# Patient Record
Sex: Female | Born: 1985
Health system: Southern US, Community
[De-identification: ages and names within clinical notes are randomized; demographics above are authoritative.]

## PROBLEM LIST (undated history)

## (undated) DIAGNOSIS — M351 Other overlap syndromes: Secondary | ICD-10-CM

## (undated) DIAGNOSIS — M069 Rheumatoid arthritis, unspecified: Secondary | ICD-10-CM

## (undated) DIAGNOSIS — M3320 Polymyositis, organ involvement unspecified: Secondary | ICD-10-CM

## (undated) DIAGNOSIS — I272 Pulmonary hypertension, unspecified: Secondary | ICD-10-CM

## (undated) DIAGNOSIS — K209 Esophagitis, unspecified without bleeding: Secondary | ICD-10-CM

## (undated) DIAGNOSIS — D649 Anemia, unspecified: Secondary | ICD-10-CM

## (undated) DIAGNOSIS — Z794 Long term (current) use of insulin: Principal | ICD-10-CM

## (undated) DIAGNOSIS — E119 Type 2 diabetes mellitus without complications: Principal | ICD-10-CM

## (undated) DIAGNOSIS — R7881 Bacteremia: Secondary | ICD-10-CM

## (undated) DIAGNOSIS — D259 Leiomyoma of uterus, unspecified: Secondary | ICD-10-CM

## (undated) DIAGNOSIS — J4520 Mild intermittent asthma, uncomplicated: Secondary | ICD-10-CM

## (undated) DIAGNOSIS — R509 Fever, unspecified: Secondary | ICD-10-CM

## (undated) DIAGNOSIS — R748 Abnormal levels of other serum enzymes: Secondary | ICD-10-CM

## (undated) DIAGNOSIS — J111 Influenza due to unidentified influenza virus with other respiratory manifestations: Secondary | ICD-10-CM

## (undated) DIAGNOSIS — E1110 Type 2 diabetes mellitus with ketoacidosis without coma: Principal | ICD-10-CM

## (undated) DIAGNOSIS — J069 Acute upper respiratory infection, unspecified: Secondary | ICD-10-CM

## (undated) DIAGNOSIS — R519 Headache, unspecified: Secondary | ICD-10-CM

## (undated) DIAGNOSIS — R7989 Other specified abnormal findings of blood chemistry: Secondary | ICD-10-CM

## (undated) DIAGNOSIS — B9561 Methicillin susceptible Staphylococcus aureus infection as the cause of diseases classified elsewhere: Principal | ICD-10-CM

## (undated) DIAGNOSIS — E1165 Type 2 diabetes mellitus with hyperglycemia: Secondary | ICD-10-CM

## (undated) DIAGNOSIS — L509 Urticaria, unspecified: Secondary | ICD-10-CM

## (undated) DIAGNOSIS — M71052 Abscess of bursa, left hip: Principal | ICD-10-CM

## (undated) DIAGNOSIS — Z0000 Encounter for general adult medical examination without abnormal findings: Secondary | ICD-10-CM

## (undated) DIAGNOSIS — E1369 Other specified diabetes mellitus with other specified complication: Principal | ICD-10-CM

---
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
## 2006-04-14 ENCOUNTER — Other Ambulatory Visit: Admission: RE | Admit: 2006-04-14 | Discharge: 2006-04-14 | Payer: Self-pay | Admitting: Gynecology

## 2007-02-09 ENCOUNTER — Ambulatory Visit: Payer: Self-pay | Admitting: Physical Medicine & Rehabilitation

## 2007-02-10 ENCOUNTER — Encounter
Admission: RE | Admit: 2007-02-10 | Discharge: 2007-02-10 | Payer: Self-pay | Admitting: Physical Medicine & Rehabilitation

## 2008-10-14 ENCOUNTER — Ambulatory Visit: Payer: Self-pay | Admitting: Gynecology

## 2008-10-14 ENCOUNTER — Encounter: Payer: Self-pay | Admitting: Gynecology

## 2009-06-21 ENCOUNTER — Ambulatory Visit: Payer: Self-pay | Admitting: Diagnostic Radiology

## 2009-06-21 ENCOUNTER — Emergency Department (HOSPITAL_BASED_OUTPATIENT_CLINIC_OR_DEPARTMENT_OTHER): Admission: EM | Admit: 2009-06-21 | Discharge: 2009-06-21 | Payer: Self-pay | Admitting: Emergency Medicine

## 2009-12-10 ENCOUNTER — Emergency Department (HOSPITAL_BASED_OUTPATIENT_CLINIC_OR_DEPARTMENT_OTHER): Admission: EM | Admit: 2009-12-10 | Discharge: 2009-12-10 | Payer: Self-pay | Admitting: Emergency Medicine

## 2010-01-31 ENCOUNTER — Emergency Department (HOSPITAL_BASED_OUTPATIENT_CLINIC_OR_DEPARTMENT_OTHER): Admission: EM | Admit: 2010-01-31 | Discharge: 2010-01-31 | Payer: Self-pay | Admitting: Emergency Medicine

## 2010-01-31 ENCOUNTER — Ambulatory Visit: Payer: Self-pay | Admitting: Diagnostic Radiology

## 2010-06-27 LAB — URINALYSIS, ROUTINE W REFLEX MICROSCOPIC
Bilirubin Urine: NEGATIVE
Glucose, UA: NEGATIVE mg/dL
Hgb urine dipstick: NEGATIVE
Ketones, ur: NEGATIVE mg/dL
Nitrite: NEGATIVE
Protein, ur: NEGATIVE mg/dL
Specific Gravity, Urine: 1.005 (ref 1.005–1.030)
Urobilinogen, UA: 0.2 mg/dL (ref 0.0–1.0)
pH: 7 (ref 5.0–8.0)

## 2010-06-27 LAB — DIFFERENTIAL
Basophils Absolute: 0 10*3/uL (ref 0.0–0.1)
Basophils Relative: 0 % (ref 0–1)
Eosinophils Absolute: 0 10*3/uL (ref 0.0–0.7)
Eosinophils Relative: 0 % (ref 0–5)
Lymphocytes Relative: 18 % (ref 12–46)
Lymphs Abs: 2.5 10*3/uL (ref 0.7–4.0)
Monocytes Absolute: 0.2 10*3/uL (ref 0.1–1.0)
Monocytes Relative: 1 % — ABNORMAL LOW (ref 3–12)
Neutro Abs: 10.9 10*3/uL — ABNORMAL HIGH (ref 1.7–7.7)
Neutrophils Relative %: 80 % — ABNORMAL HIGH (ref 43–77)

## 2010-06-27 LAB — CBC
HCT: 31 % — ABNORMAL LOW (ref 36.0–46.0)
Hemoglobin: 10.1 g/dL — ABNORMAL LOW (ref 12.0–15.0)
MCH: 24.5 pg — ABNORMAL LOW (ref 26.0–34.0)
MCHC: 32.6 g/dL (ref 30.0–36.0)
MCV: 74.9 fL — ABNORMAL LOW (ref 78.0–100.0)
Platelets: 456 10*3/uL — ABNORMAL HIGH (ref 150–400)
RBC: 4.13 MIL/uL (ref 3.87–5.11)
RDW: 16.4 % — ABNORMAL HIGH (ref 11.5–15.5)
WBC: 13.6 10*3/uL — ABNORMAL HIGH (ref 4.0–10.5)

## 2010-06-27 LAB — BASIC METABOLIC PANEL
BUN: 9 mg/dL (ref 6–23)
CO2: 26 mEq/L (ref 19–32)
Calcium: 8.7 mg/dL (ref 8.4–10.5)
Chloride: 106 mEq/L (ref 96–112)
Creatinine, Ser: 0.4 mg/dL (ref 0.4–1.2)
GFR calc Af Amer: >60 mL/min (ref >60)
GFR calc non Af Amer: >60 mL/min (ref >60)
Glucose, Bld: 138 mg/dL — ABNORMAL HIGH (ref 70–99)
Potassium: 4.4 mEq/L (ref 3.5–5.1)
Sodium: 143 mEq/L (ref 135–145)

## 2010-06-27 LAB — POCT CARDIAC MARKERS
CKMB, poc: 6.5 ng/mL (ref 1.0–8.0)
CKMB, poc: 9.6 ng/mL (ref 1.0–8.0)
Myoglobin, poc: 349 ng/mL (ref 12–200)
Myoglobin, poc: >500 ng/mL (ref 12–200)
Troponin i, poc: <0.05 ng/mL (ref 0.00–0.09)
Troponin i, poc: <0.05 ng/mL (ref 0.00–0.09)

## 2010-06-27 LAB — PREGNANCY, URINE: Preg Test, Ur: NEGATIVE

## 2010-07-04 ENCOUNTER — Ambulatory Visit: Payer: Self-pay | Admitting: Family Medicine

## 2010-07-04 DIAGNOSIS — Z0289 Encounter for other administrative examinations: Secondary | ICD-10-CM

## 2010-07-09 LAB — DIFFERENTIAL
Basophils Absolute: 0 10*3/uL (ref 0.0–0.1)
Basophils Relative: 0 % (ref 0–1)
Eosinophils Absolute: 0.2 10*3/uL (ref 0.0–0.7)
Eosinophils Relative: 2 % (ref 0–5)
Lymphocytes Relative: 31 % (ref 12–46)
Lymphs Abs: 2.7 10*3/uL (ref 0.7–4.0)
Monocytes Absolute: 0.3 10*3/uL (ref 0.1–1.0)
Monocytes Relative: 3 % (ref 3–12)
Neutro Abs: 5.6 10*3/uL (ref 1.7–7.7)
Neutrophils Relative %: 63 % (ref 43–77)

## 2010-07-09 LAB — BASIC METABOLIC PANEL
BUN: 9 mg/dL (ref 6–23)
CO2: 30 mEq/L (ref 19–32)
Calcium: 8.5 mg/dL (ref 8.4–10.5)
Chloride: 106 mEq/L (ref 96–112)
Creatinine, Ser: 0.4 mg/dL (ref 0.4–1.2)
GFR calc Af Amer: >60 mL/min (ref >60)
GFR calc non Af Amer: >60 mL/min (ref >60)
Glucose, Bld: 100 mg/dL — ABNORMAL HIGH (ref 70–99)
Potassium: 4 mEq/L (ref 3.5–5.1)
Sodium: 143 mEq/L (ref 135–145)

## 2010-07-09 LAB — CBC
HCT: 30.7 % — ABNORMAL LOW (ref 36.0–46.0)
Hemoglobin: 9.9 g/dL — ABNORMAL LOW (ref 12.0–15.0)
MCHC: 32.4 g/dL (ref 30.0–36.0)
MCV: 73.9 fL — ABNORMAL LOW (ref 78.0–100.0)
Platelets: 443 10*3/uL — ABNORMAL HIGH (ref 150–400)
RBC: 4.16 MIL/uL (ref 3.87–5.11)
RDW: 15.4 % (ref 11.5–15.5)
WBC: 8.8 10*3/uL (ref 4.0–10.5)

## 2010-12-18 ENCOUNTER — Encounter: Payer: Self-pay | Admitting: *Deleted

## 2010-12-18 ENCOUNTER — Emergency Department (INDEPENDENT_AMBULATORY_CARE_PROVIDER_SITE_OTHER): Payer: BC Managed Care – PPO

## 2010-12-18 ENCOUNTER — Emergency Department (HOSPITAL_BASED_OUTPATIENT_CLINIC_OR_DEPARTMENT_OTHER)
Admission: EM | Admit: 2010-12-18 | Discharge: 2010-12-18 | Disposition: A | Discharge status: Home / Self Care | Payer: BC Managed Care – PPO | Attending: Emergency Medicine | Admitting: Emergency Medicine

## 2010-12-18 ENCOUNTER — Other Ambulatory Visit: Payer: Self-pay

## 2010-12-18 DIAGNOSIS — R109 Unspecified abdominal pain: Secondary | ICD-10-CM

## 2010-12-18 DIAGNOSIS — R110 Nausea: Secondary | ICD-10-CM

## 2010-12-18 DIAGNOSIS — M3320 Polymyositis, organ involvement unspecified: Secondary | ICD-10-CM

## 2010-12-18 DIAGNOSIS — R1013 Epigastric pain: Secondary | ICD-10-CM | POA: Insufficient documentation

## 2010-12-18 DIAGNOSIS — K5900 Constipation, unspecified: Secondary | ICD-10-CM | POA: Insufficient documentation

## 2010-12-18 HISTORY — DX: Rheumatoid arthritis, unspecified: M06.9

## 2010-12-18 HISTORY — DX: Polymyositis, organ involvement unspecified: M33.20

## 2010-12-18 LAB — LACTIC ACID, PLASMA: Lactic Acid, Venous: 1 mmol/L (ref 0.5–2.2)

## 2010-12-18 LAB — URINALYSIS, ROUTINE W REFLEX MICROSCOPIC
Glucose, UA: NEGATIVE mg/dL
Hgb urine dipstick: NEGATIVE
Ketones, ur: NEGATIVE mg/dL
Leukocytes, UA: NEGATIVE
Nitrite: NEGATIVE
Specific Gravity, Urine: 1.017 (ref 1.005–1.030)
Urobilinogen, UA: 1 mg/dL (ref 0.0–1.0)
pH: 7 (ref 5.0–8.0)

## 2010-12-18 LAB — CBC
HCT: 28.5 % — ABNORMAL LOW (ref 36.0–46.0)
Hemoglobin: 8.2 g/dL — ABNORMAL LOW (ref 12.0–15.0)
MCH: 20.9 pg — ABNORMAL LOW (ref 26.0–34.0)
MCHC: 28.8 g/dL — ABNORMAL LOW (ref 30.0–36.0)
MCV: 72.5 fL — ABNORMAL LOW (ref 78.0–100.0)
Platelets: 473 10*3/uL — ABNORMAL HIGH (ref 150–400)
RBC: 3.93 MIL/uL (ref 3.87–5.11)
WBC: 11 10*3/uL — ABNORMAL HIGH (ref 4.0–10.5)

## 2010-12-18 LAB — COMPREHENSIVE METABOLIC PANEL
ALT: 19 U/L (ref 0–35)
AST: 48 U/L — ABNORMAL HIGH (ref 0–37)
Albumin: 2.5 g/dL — ABNORMAL LOW (ref 3.5–5.2)
Alkaline Phosphatase: 83 U/L (ref 39–117)
BUN: 10 mg/dL (ref 6–23)
CO2: 23 mEq/L (ref 19–32)
Chloride: 102 mEq/L (ref 96–112)
Creatinine, Ser: <0.47 mg/dL — ABNORMAL LOW (ref 0.50–1.10)
Glucose, Bld: 99 mg/dL (ref 70–99)
Potassium: 4 mEq/L (ref 3.5–5.1)
Sodium: 137 mEq/L (ref 135–145)
Total Protein: 8.3 g/dL (ref 6.0–8.3)

## 2010-12-18 LAB — PREGNANCY, URINE: Preg Test, Ur: NEGATIVE

## 2010-12-18 LAB — LIPASE, BLOOD: Lipase: 17 U/L (ref 11–59)

## 2010-12-18 MED ORDER — FAMOTIDINE IN NACL 20-0.9 MG/50ML-% IV SOLN
20.0000 mg | Freq: Once | INTRAVENOUS | Status: AC
  Administered 2010-12-18: 20 mg via INTRAVENOUS
  Filled 2010-12-18: qty 50

## 2010-12-18 MED ORDER — GI COCKTAIL ~~LOC~~
30.0000 mL | Freq: Once | ORAL | Status: AC
  Administered 2010-12-18: 30 mL via ORAL
  Filled 2010-12-18: qty 30

## 2010-12-18 MED ORDER — RANITIDINE HCL 150 MG PO CAPS: 150.0000 mg | ORAL_CAPSULE | Freq: Every day | ORAL | Status: DC

## 2010-12-18 MED ORDER — OMEPRAZOLE 20 MG PO CPDR: 20.0000 mg | DELAYED_RELEASE_CAPSULE | Freq: Every day | ORAL | Status: AC

## 2010-12-18 MED ORDER — ONDANSETRON HCL 4 MG/2ML IJ SOLN
4.0000 mg | Freq: Once | INTRAMUSCULAR | Status: AC
  Administered 2010-12-18: 4 mg via INTRAVENOUS
  Filled 2010-12-18: qty 2

## 2010-12-18 NOTE — ED Notes (Signed)
Assist with rectal exam, up to bathroom

## 2010-12-18 NOTE — ED Notes (Signed)
Awaiting disposition, family at bedside

## 2010-12-18 NOTE — ED Notes (Signed)
Medicated as noted, waiting on xray, family at bedside

## 2010-12-18 NOTE — ED Notes (Signed)
Pt states she has felt constipated since Sunday, has taken some "gas ex".  Had bowel movement today, "was hard, dry, and not my normal".  Also co some difficulty swallowing at times

## 2010-12-18 NOTE — ED Notes (Signed)
Started feeling discomfort in upper abdomen on Sunday feels like it goes to her back no vomiting having nausea states had bowel movement this am but was "hard" feels like she is a bit constipated

## 2010-12-18 NOTE — ED Notes (Signed)
Pt returns from xray

## 2010-12-18 NOTE — ED Provider Notes (Addendum)
History     CSN: [id number] Arrival date & time: 12/18/2010 10:33 AM  Chief Complaint  Patient presents with  . Abdominal Pain  . Constipation   HPI Comments: History rheumatoid arthritis on chronic steroids presenting with epigastric pain for the past 3 days associated with nausea. The pain is burning and worse when she eats. It is also worse if she sits up.  She has a decreased appetite and feels like she is constipated. Her bowel movements have been hard. She has not noticed any blood in her stools. She's not had vomiting. She denies any chest pain or shortness of breath denies any difficulty swallowing.  The history is provided by the patient.    Past Medical History  Diagnosis Date  . Arthritis, rheumatoid   . Polymyositis     History reviewed. No pertinent past surgical history.  History reviewed. No pertinent family history.  History  Substance Use Topics  . Smoking status: Never Smoker   . Smokeless tobacco: Not on file  . Alcohol Use: No    OB History    Grav Para Term Preterm Abortions TAB SAB Ect Mult Living                  Review of Systems  Constitutional: Positive for appetite change. Negative for fever.  HENT: Positive for sore throat. Negative for trouble swallowing.   Eyes: Negative.  Negative for visual disturbance.  Respiratory: Positive for chest tightness. Negative for cough and shortness of breath.   Gastrointestinal: Positive for nausea, abdominal pain and constipation. Negative for vomiting.  Genitourinary: Negative for dysuria, hematuria, vaginal bleeding and vaginal pain.  Musculoskeletal: Negative for back pain.  Neurological: Negative for seizures, weakness and headaches.  Psychiatric/Behavioral: Negative.     Physical Exam  BP 123/76  Pulse 100  Temp(Src) 98.5 F (36.9 C) (Oral)  Resp 18  SpO2 99%  LMP 12/14/2010  Physical Exam  Constitutional: She is oriented to person, place, and time. She appears well-developed and  well-nourished. No distress.  HENT:  Head: Normocephalic and atraumatic.  Mouth/Throat: No oropharyngeal exudate.  Eyes: Conjunctivae are normal. Pupils are equal, round, and reactive to light.  Neck: Normal range of motion.  Cardiovascular: Normal rate, regular rhythm and normal heart sounds.   Pulmonary/Chest: Effort normal and breath sounds normal. No respiratory distress.  Abdominal: Soft. There is tenderness. There is no rebound and no guarding.       Mild epigastric tenderness  Musculoskeletal: Normal range of motion. She exhibits no edema and no tenderness.  Neurological: She is alert and oriented to person, place, and time. No cranial nerve deficit.  Skin: Skin is warm.    ED Course  Procedures  MDM Epigastric pain with burning and nausea worse after eating. Patient with risk factors for gastritis and esophagitis with chronic steroids and NSAIDs use.   We'll check labs, treat symptoms with Pepcid and GI cocktail. We'll also obtain acute abdominal series to assess for stool burden.  EKG to assess for pericarditis given the positional nature of pain   Date: 12/18/2010  Rate: 90   Rhythm: normal sinus rhythm  QRS Axis: normal  Intervals: normal  ST/T Wave abnormalities: normal and nonspecific ST changes  Conduction Disutrbances:none  Narrative Interpretation:   Old EKG Reviewed: unchanged  2 g drop in hemoglobin noted since last year. His stool is guaiac-negative. Exam performed with female chaperone. Pain and burning improved with symptomatic treatment.  Will prescribe PPI, antihistamine, stool softener. F/w PCP  for GI referral as needed.  Results for orders placed during the hospital encounter of 12/18/10  URINALYSIS, ROUTINE W REFLEX MICROSCOPIC      Component Value Range   Color, Urine YELLOW  YELLOW    Appearance CLEAR  CLEAR    Specific Gravity, Urine 1.017  1.005 - 1.030    pH 7.0  5.0 - 8.0    Glucose, UA NEGATIVE  NEGATIVE (mg/dL)   Hgb urine dipstick  NEGATIVE  NEGATIVE    Bilirubin Urine NEGATIVE  NEGATIVE    Ketones, ur NEGATIVE  NEGATIVE (mg/dL)   Protein, ur NEGATIVE  NEGATIVE (mg/dL)   Urobilinogen, UA 1.0  0.0 - 1.0 (mg/dL)   Nitrite NEGATIVE  NEGATIVE    Leukocytes, UA NEGATIVE  NEGATIVE   PREGNANCY, URINE      Component Value Range   Preg Test, Ur NEGATIVE    CBC      Component Value Range   WBC 11.0 (*) 4.0 - 10.5 (K/uL)   RBC 3.93  3.87 - 5.11 (MIL/uL)   Hemoglobin 8.2 (*) 12.0 - 15.0 (g/dL)   HCT 11.9 (*) 14.7 - 46.0 (%)   MCV 72.5 (*) 78.0 - 100.0 (fL)   MCH 20.9 (*) 26.0 - 34.0 (pg)   MCHC 28.8 (*) 30.0 - 36.0 (g/dL)   RDW 82.9 (*) 56.2 - 15.5 (%)   Platelets 473 (*) 150 - 400 (K/uL)  COMPREHENSIVE METABOLIC PANEL      Component Value Range   Sodium 137  135 - 145 (mEq/L)   Potassium 4.0  3.5 - 5.1 (mEq/L)   Chloride 102  96 - 112 (mEq/L)   CO2 23  19 - 32 (mEq/L)   Glucose, Bld 99  70 - 99 (mg/dL)   BUN 10  6 - 23 (mg/dL)   Creatinine, Ser <1.30 (*) 0.50 - 1.10 (mg/dL)   Calcium 9.2  8.4 - 86.5 (mg/dL)   Total Protein 8.3  6.0 - 8.3 (g/dL)   Albumin 2.5 (*) 3.5 - 5.2 (g/dL)   AST 48 (*) 0 - 37 (U/L)   ALT 19  0 - 35 (U/L)   Alkaline Phosphatase 83  39 - 117 (U/L)   Total Bilirubin 0.2 (*) 0.3 - 1.2 (mg/dL)   GFR calc non Af Amer NOT CALCULATED  >60 (mL/min)   GFR calc Af Amer NOT CALCULATED  >60 (mL/min)  LIPASE, BLOOD      Component Value Range   Lipase 17  11 - 59 (U/L)  LACTIC ACID, PLASMA      Component Value Range   Lactic Acid, Venous 1.0  0.5 - 2.2 (mmol/L)  OCCULT BLOOD X 1 CARD TO LAB, STOOL      Component Value Range   Fecal Occult Bld NEGATIVE     Dg Abd Acute W/chest  12/18/2010  *RADIOLOGY REPORT*  Clinical Data: Abdominal pain and nausea.  Polymyositis and rheumatoid arthritis.  ACUTE ABDOMEN SERIES (ABDOMEN 2 VIEW & CHEST 1 VIEW)  Comparison: Chest radiograph on 01/31/2010  Findings: No evidence of dilated bowel loops or air fluid levels. No evidence of free air.  Pelvic phleboliths  noted but no definite radiopaque calculi identified.  Moderate colonic stool burden noted.  Pleural - parenchymal scarring is seen in both lung bases which is stable compared to the previous exams.  No acute or superimposed infiltrate is seen.  No evidence of pleural effusion.  Heart size is within normal limits.  IMPRESSION:  1.  No acute findings. 2.  Chronic bibasilar pleural - parenchymal scarring. 3.  Moderate colonic stool burden noted.  Original Report Authenticated By: Danae Orleans, M.D.      Glynn Octave, MD 12/18/10 [id number]  Glynn Octave, MD 12/26/10 [phone]

## 2010-12-21 ENCOUNTER — Emergency Department (INDEPENDENT_AMBULATORY_CARE_PROVIDER_SITE_OTHER): Payer: BC Managed Care – PPO

## 2010-12-21 ENCOUNTER — Encounter (HOSPITAL_BASED_OUTPATIENT_CLINIC_OR_DEPARTMENT_OTHER): Payer: Self-pay | Admitting: *Deleted

## 2010-12-21 ENCOUNTER — Inpatient Hospital Stay (HOSPITAL_COMMUNITY)
Admission: EM | Admit: 2010-12-21 | Discharge: 2010-12-30 | DRG: 551 | Disposition: A | Discharge status: Home / Self Care | Payer: BC Managed Care – PPO | Attending: Internal Medicine | Admitting: Internal Medicine

## 2010-12-21 ENCOUNTER — Emergency Department (HOSPITAL_BASED_OUTPATIENT_CLINIC_OR_DEPARTMENT_OTHER)
Admission: EM | Admit: 2010-12-21 | Discharge: 2010-12-21 | Disposition: A | Discharge status: Home / Self Care | Payer: BC Managed Care – PPO | Source: Home / Self Care | Attending: Emergency Medicine | Admitting: Emergency Medicine

## 2010-12-21 DIAGNOSIS — B3781 Candidal esophagitis: Secondary | ICD-10-CM | POA: Insufficient documentation

## 2010-12-21 DIAGNOSIS — R1013 Epigastric pain: Secondary | ICD-10-CM

## 2010-12-21 DIAGNOSIS — E860 Dehydration: Secondary | ICD-10-CM | POA: Diagnosis present

## 2010-12-21 DIAGNOSIS — B370 Candidal stomatitis: Secondary | ICD-10-CM

## 2010-12-21 DIAGNOSIS — E43 Unspecified severe protein-calorie malnutrition: Secondary | ICD-10-CM | POA: Diagnosis present

## 2010-12-21 DIAGNOSIS — R0789 Other chest pain: Secondary | ICD-10-CM

## 2010-12-21 DIAGNOSIS — K208 Other esophagitis without bleeding: Principal | ICD-10-CM | POA: Diagnosis present

## 2010-12-21 DIAGNOSIS — R000 Tachycardia, unspecified: Secondary | ICD-10-CM | POA: Diagnosis present

## 2010-12-21 DIAGNOSIS — J8410 Pulmonary fibrosis, unspecified: Secondary | ICD-10-CM

## 2010-12-21 DIAGNOSIS — M083 Juvenile rheumatoid polyarthritis (seronegative): Secondary | ICD-10-CM | POA: Diagnosis present

## 2010-12-21 DIAGNOSIS — D509 Iron deficiency anemia, unspecified: Secondary | ICD-10-CM | POA: Diagnosis present

## 2010-12-21 DIAGNOSIS — R1310 Dysphagia, unspecified: Secondary | ICD-10-CM | POA: Diagnosis present

## 2010-12-21 DIAGNOSIS — E876 Hypokalemia: Secondary | ICD-10-CM | POA: Diagnosis present

## 2010-12-21 DIAGNOSIS — I959 Hypotension, unspecified: Secondary | ICD-10-CM | POA: Diagnosis present

## 2010-12-21 DIAGNOSIS — M3320 Polymyositis, organ involvement unspecified: Secondary | ICD-10-CM | POA: Diagnosis present

## 2010-12-21 DIAGNOSIS — Z79899 Other long term (current) drug therapy: Secondary | ICD-10-CM

## 2010-12-21 LAB — DIFFERENTIAL
Basophils Absolute: 0 10*3/uL (ref 0.0–0.1)
Basophils Relative: 0 % (ref 0–1)
Lymphs Abs: 1.6 10*3/uL (ref 0.7–4.0)
Monocytes Relative: 6 % (ref 3–12)
Neutro Abs: 6 10*3/uL (ref 1.7–7.7)

## 2010-12-21 LAB — COMPREHENSIVE METABOLIC PANEL
ALT: 19 U/L (ref 0–35)
ALT: 19 U/L (ref 0–35)
AST: 56 U/L — ABNORMAL HIGH (ref 0–37)
BUN: 14 mg/dL (ref 6–23)
CO2: 22 mEq/L (ref 19–32)
Calcium: 9.1 mg/dL (ref 8.4–10.5)
Calcium: 8.9 mg/dL (ref 8.4–10.5)
Chloride: 99 mEq/L (ref 96–112)
Creatinine, Ser: <0.47 mg/dL — ABNORMAL LOW (ref 0.50–1.10)
GFR calc non Af Amer: >60 mL/min (ref >60)
Glucose, Bld: 79 mg/dL (ref 70–99)
Sodium: 138 mEq/L (ref 135–145)
Sodium: 138 mEq/L (ref 135–145)
Total Bilirubin: 0.3 mg/dL (ref 0.3–1.2)
Total Protein: 9.2 g/dL — ABNORMAL HIGH (ref 6.0–8.3)

## 2010-12-21 LAB — CBC
Hemoglobin: 9 g/dL — ABNORMAL LOW (ref 12.0–15.0)
MCH: 21 pg — ABNORMAL LOW (ref 26.0–34.0)
MCHC: 29 g/dL — ABNORMAL LOW (ref 30.0–36.0)
MCV: 72.4 fL — ABNORMAL LOW (ref 78.0–100.0)
Platelets: 453 10*3/uL — ABNORMAL HIGH (ref 150–400)
RBC: 4.56 MIL/uL (ref 3.87–5.11)
WBC: 10.6 10*3/uL — ABNORMAL HIGH (ref 4.0–10.5)

## 2010-12-21 LAB — URINALYSIS, ROUTINE W REFLEX MICROSCOPIC
Ketones, ur: >80 mg/dL — AB
Leukocytes, UA: NEGATIVE
Protein, ur: 100 mg/dL — AB
Urobilinogen, UA: 0.2 mg/dL (ref 0.0–1.0)

## 2010-12-21 LAB — POCT PREGNANCY, URINE: Preg Test, Ur: NEGATIVE

## 2010-12-21 LAB — URINE MICROSCOPIC-ADD ON

## 2010-12-21 LAB — LIPASE, BLOOD: Lipase: 27 U/L (ref 11–59)

## 2010-12-21 MED ORDER — GI COCKTAIL ~~LOC~~
30.0000 mL | Freq: Once | ORAL | Status: AC
  Administered 2010-12-21: 30 mL via ORAL
  Filled 2010-12-21: qty 30

## 2010-12-21 MED ORDER — FLUCONAZOLE 200 MG PO TABS: 200.0000 mg | ORAL_TABLET | Freq: Every day | ORAL | Status: AC

## 2010-12-21 MED ORDER — NYSTATIN 100000 UNIT/ML MT SUSP: 500000.0000 [IU] | Freq: Four times a day (QID) | OROMUCOSAL | Status: AC

## 2010-12-21 NOTE — ED Notes (Signed)
Pt c/o epigastric pain since Sunday. Was seen here on Monday for same and started on meds but pt states that she has had no relief. Decreased appetite per pt.

## 2010-12-21 NOTE — ED Notes (Signed)
C/o acid reflux and pain in upper abd since Sunday. Was seen here Monday for same.

## 2010-12-21 NOTE — ED Provider Notes (Signed)
History     CSN: [id number] Arrival date & time: 12/21/2010  5:12 AM  Chief Complaint  Patient presents with  . Gastrophageal Reflux   Patient is a 25 y.o. female presenting with GERD. The history is provided by the patient and a parent. No language interpreter was used.  Gastrophageal Reflux The current episode started more than 2 days ago. The problem occurs constantly. The problem has been gradually worsening. Associated symptoms include abdominal pain. Pertinent negatives include no chest pain, no headaches and no shortness of breath. Associated symptoms comments: EPIGASTRIC PAIN AND odynophagia and anorexia secondary to pain. The symptoms are aggravated by nothing. The symptoms are relieved by nothing. She has tried nothing for the symptoms. The treatment provided no relief.  Patient is on chronic steroid therapy and Humira.  Pain is a 10/10 with swallowing from the epigastrum to the mouth. No CP, no SOB no n/v/d.  No swelling of the lower extremities.  No cough.    Past Medical History  Diagnosis Date  . Arthritis, rheumatoid   . Polymyositis     History reviewed. No pertinent past surgical history.  History reviewed. No pertinent family history.  History  Substance Use Topics  . Smoking status: Never Smoker   . Smokeless tobacco: Not on file  . Alcohol Use: No    OB History    Grav Para Term Preterm Abortions TAB SAB Ect Mult Living                  Review of Systems  Constitutional: Negative for fever, chills, diaphoresis, activity change, appetite change, fatigue and unexpected weight change.  HENT: Positive for sore throat and trouble swallowing. Negative for facial swelling, drooling, neck pain, neck stiffness and voice change.   Eyes: Negative for discharge.  Respiratory: Negative for shortness of breath and wheezing.   Cardiovascular: Negative for chest pain.  Gastrointestinal: Positive for abdominal pain. Negative for abdominal distention.  Musculoskeletal:  Negative.   Skin: Negative.   Neurological: Negative for headaches.  Hematological: Negative.   Psychiatric/Behavioral: Negative.     Physical Exam  BP 117/73  Pulse 113  Temp(Src) 98.3 F (36.8 C) (Oral)  Resp 16  Ht 5\' 6"  (1.676 m)  Wt 130 lb (58.968 kg)  BMI 20.98 kg/m2  SpO2 96%  LMP 12/14/2010  Physical Exam  Constitutional: She is oriented to person, place, and time. She appears well-developed and well-nourished.  HENT:  Head: Normocephalic and atraumatic.  Nose: Nose normal.       thrush  Eyes: Right eye exhibits no discharge. Left eye exhibits no discharge.  Neck: Normal range of motion. Neck supple.  Cardiovascular: Normal rate and regular rhythm.   Pulmonary/Chest: Effort normal and breath sounds normal. No stridor. No respiratory distress.  Abdominal: Soft. Bowel sounds are normal.  Musculoskeletal: Normal range of motion. She exhibits no edema and no tenderness.  Neurological: She is alert and oriented to person, place, and time.  Skin: Skin is warm and dry.  Psychiatric: She has a normal mood and affect.    ED Course  Procedures  MDM Follow up with your PMD, make appointment to have EGD.  Return for worsening pain or inability to swallow. Patient and mother verbalize understanding and agree to follow up     Zaid Tomes Smitty Cords, MD 12/21/10 [phone]

## 2010-12-22 LAB — CBC
HCT: 27.7 % — ABNORMAL LOW (ref 36.0–46.0)
Hemoglobin: 7.8 g/dL — ABNORMAL LOW (ref 12.0–15.0)
MCHC: 28.2 g/dL — ABNORMAL LOW (ref 30.0–36.0)
MCV: 74.3 fL — ABNORMAL LOW (ref 78.0–100.0)
RDW: 18.8 % — ABNORMAL HIGH (ref 11.5–15.5)
WBC: 8.4 10*3/uL (ref 4.0–10.5)

## 2010-12-22 LAB — DIFFERENTIAL
Basophils Relative: 0 % (ref 0–1)
Eosinophils Absolute: 0.1 10*3/uL (ref 0.0–0.7)
Eosinophils Relative: 1 % (ref 0–5)
Monocytes Absolute: 0.3 10*3/uL (ref 0.1–1.0)
Neutro Abs: 6.7 10*3/uL (ref 1.7–7.7)

## 2010-12-22 LAB — BASIC METABOLIC PANEL
BUN: 12 mg/dL (ref 6–23)
Chloride: 106 mEq/L (ref 96–112)
Creatinine, Ser: <0.47 mg/dL — ABNORMAL LOW (ref 0.50–1.10)
Glucose, Bld: 70 mg/dL (ref 70–99)
Potassium: 3.4 mEq/L — ABNORMAL LOW (ref 3.5–5.1)

## 2010-12-22 LAB — IRON AND TIBC
Saturation Ratios: 6 % — ABNORMAL LOW (ref 20–55)
TIBC: 171 ug/dL — ABNORMAL LOW (ref 250–470)

## 2010-12-22 LAB — FERRITIN: Ferritin: 1011 ng/mL — ABNORMAL HIGH (ref 10–291)

## 2010-12-22 LAB — FOLATE: Folate: >20 ng/mL

## 2010-12-22 LAB — VITAMIN B12: Vitamin B-12: 338 pg/mL (ref 211–911)

## 2010-12-23 LAB — CBC
HCT: 27.7 % — ABNORMAL LOW (ref 36.0–46.0)
Hemoglobin: 8 g/dL — ABNORMAL LOW (ref 12.0–15.0)
MCH: 21.7 pg — ABNORMAL LOW (ref 26.0–34.0)
MCHC: 28.9 g/dL — ABNORMAL LOW (ref 30.0–36.0)
MCV: 75.3 fL — ABNORMAL LOW (ref 78.0–100.0)
RBC: 3.68 MIL/uL — ABNORMAL LOW (ref 3.87–5.11)

## 2010-12-23 LAB — DIFFERENTIAL
Basophils Relative: 0 % (ref 0–1)
Lymphocytes Relative: 25 % (ref 12–46)
Lymphs Abs: 1.4 10*3/uL (ref 0.7–4.0)
Monocytes Absolute: 0.5 10*3/uL (ref 0.1–1.0)
Monocytes Relative: 9 % (ref 3–12)
Neutro Abs: 3.8 10*3/uL (ref 1.7–7.7)
Neutrophils Relative %: 66 % (ref 43–77)

## 2010-12-23 LAB — PREPARE RBC (CROSSMATCH)

## 2010-12-24 DIAGNOSIS — K2100 Gastro-esophageal reflux disease with esophagitis, without bleeding: Secondary | ICD-10-CM

## 2010-12-24 DIAGNOSIS — R1319 Other dysphagia: Secondary | ICD-10-CM

## 2010-12-24 LAB — CROSSMATCH: Unit division: 0

## 2010-12-24 LAB — COMPREHENSIVE METABOLIC PANEL
ALT: 15 U/L (ref 0–35)
AST: 52 U/L — ABNORMAL HIGH (ref 0–37)
Albumin: 1.8 g/dL — ABNORMAL LOW (ref 3.5–5.2)
CO2: 23 mEq/L (ref 19–32)
Calcium: 7.9 mg/dL — ABNORMAL LOW (ref 8.4–10.5)
Chloride: 108 mEq/L (ref 96–112)
Creatinine, Ser: <0.47 mg/dL — ABNORMAL LOW (ref 0.50–1.10)
Sodium: 138 mEq/L (ref 135–145)

## 2010-12-24 LAB — DIFFERENTIAL
Basophils Relative: 0 % (ref 0–1)
Eosinophils Absolute: 0.1 10*3/uL (ref 0.0–0.7)
Eosinophils Relative: 1 % (ref 0–5)
Lymphs Abs: 1.8 10*3/uL (ref 0.7–4.0)
Neutrophils Relative %: 70 % (ref 43–77)

## 2010-12-24 LAB — CBC
MCH: 22.3 pg — ABNORMAL LOW (ref 26.0–34.0)
MCV: 76.2 fL — ABNORMAL LOW (ref 78.0–100.0)
Platelets: 300 10*3/uL (ref 150–400)
RBC: 3.95 MIL/uL (ref 3.87–5.11)
RDW: 18.8 % — ABNORMAL HIGH (ref 11.5–15.5)
WBC: 7.1 10*3/uL (ref 4.0–10.5)

## 2010-12-24 LAB — OCCULT BLOOD X 1 CARD TO LAB, STOOL: Fecal Occult Bld: NEGATIVE

## 2010-12-24 NOTE — Progress Notes (Signed)
NAMEMarland Hawkins  JALACIA, MATTILA NO.:  [id number]  MEDICAL RECORD NO.:  [id number]  LOCATION:  1516                         FACILITY:  [facility]  PHYSICIAN:  Talmage Nap, MD  DATE OF BIRTH:  10-10-1985                                PROGRESS NOTE   PRIMARY CARE PHYSICIAN: Dr. Vassie Moselle in [place], [place].  CONSULTANT INVOLVED IN THE CASE: Leafy Ro, MD; GI  DIAGNOSES: 1. Dysphagia/odynophagia secondary to oral candidiasis or thrush. 2. Oral candidiasis. 3. Dehydration. 4. Microcytic anemia, status post packed red blood cell transfusion. 5. History of juvenile rheumatoid arthritis. 6. History of polymyositis. 7. Hypokalemia. 8. Abnormal LFT (elevated AST). 9. Chronic pulmonary fibrosis, most likely secondary to juvenile     rheumatoid arthritis.  HISTORY: The patient is a 25 year old African-American female with history of juvenile rheumatoid arthritis, polymyositis, and has been on Humira, Motrin as well as prednisone for over 2 years; was admitted to hospital on December 21, 2010, by Dr. Della Goo with a history of painful swallowing.  This was, however, said to be associated with substernal as well as epigastric pain for over a week.  Pain is described as sharp, burning in nature, and radiating to the back.  She claims she was nauseated and also vomited.  She also complained about poor oral intake because of the pain on swallowing.  Symptom was said to have been getting progressively worse in the past 1 week.  She denied any fever. She denied any chills.  She denied any rigor.  In the emergency room, she was found to be tachycardic and hypotensive, and subsequently admitted for stabilization of further workup.  PREADMISSION MEDICATIONS: Include; 1. Humira injection 50 mg weekly. 2. Motrin 800 mg 1 p.o. t.i.d. 3. Maalox 15 cc p.o. t.i.d.  ALLERGIES: She has no known allergies.  SOCIAL HISTORY: She is a Consulting civil engineer at the [employer], Glass blower/designer in [employer].  No history of alcohol or tobacco use.  FAMILY HISTORY: York Spaniel to be positive for multiple sclerosis.  REVIEW OF SYSTEMS: Essentially documented in the initial history and physical.  At time, the patient was seen by the admitting physician.  PHYSICAL EXAMINATION: GENERAL:  She was not in any distress.  She was looking well nourished, but pale. VITAL SIGNS:  Temperature is 99.0, blood pressure is 116/69, heart rate 130 and subsequently it was 86, respiratory rate was said to be between 16 to 18, and she was saturating 96% to 100% on room air. HEENT:  Pupils are reactive to light and extraocular muscles are intact. She said to have whitish coating of the mouth. NECK:  No jugular venous distention.  No carotid bruit or adenopathy. CHEST:  Clear to auscultation. HEART:  Sounds are 1 and 2. ABDOMEN:  Soft and nontender.  Liver and spleen tip not palpable.  Bowel sounds are positive. EXTREMITIES:  No pedal edema. NEUROLOGIC:  Exam nonfocal. MUSCULOSKELETAL SYSTEM:  Showed deforming arthritis of the wrist with ulnar deviation. SKIN:  Showed decreased turgor.  LABORATORY DATA: Initial complete blood count with no differential showed WBC of 10.6, hemoglobin of 9.5, hematocrit of 33.0, MCV of 72.4, platelet count of 453.  Comprehensive metabolic panel showed sodium  of 138, potassium of 3.4, chloride of 99 with a bicarbonate of 22, glucose is 107, BUN is 16, creatinine 0.50.  LFT showed total bilirubin of 0.3, alkaline phosphatase is 83, AST 56, ALT 19.  Total protein is 9.2, albumin is 2.7, calcium is 9.1, lipase 27.  Pregnancy test negative.  Urinalysis showed ketones greater than 90.  Protein 100, moderate bilirubin.  Urine microscopy showed calcium oxalate crystals with triphosphate crystals . Complete blood count with differential done on December 22, 2010, showed WBC of 8.4, hemoglobin of 7.8, hematocrit 27.7, MCV of 74.3,  platelet count of 358.  Basic metabolic panel showed sodium of 141, potassium of 3.4, chloride of 106 with a bicarbonate of 20, glucose is 70, BUN is 12, creatinine 0.47.  Repeat lipase level is 28.  Anemia panel done showed a serum iron 11, total iron binding capacity of 171, percentage saturation is 6, and UIBC is 160.  Vitamin B12 is 338 and serum folate is greater than 20.  Magnesium level is 2.0.  Repeat complete blood count with differential done on December 24, 2010, showed WBC of 7.1, hemoglobin of 8.8, hematocrit of 30.1, MCV of 76.2 with a platelet count of 300. Comprehensive metabolic panel showed sodium of 138, potassium of 2.8, chloride of 108 with a bicarbonate of 23, glucose is 111.  BUN is 3, creatinine is less than 0.47.  Magnesium level is at 1.7.  Imaging studies done include acute abdominal series which showed chronic pulmonary fibrosis with nonobstructive pulmonary gas pattern.  HOSPITAL COURSE: She was admitted to a general medical floor.  She was started on normal saline IV to go at a rate of 125 cc an hour.  Pain control was done with Tylenol as well as Dilaudid 0.5 to 1 mg IV q.4 p.r.n.  She was also placed on Protonix 40 mg IV q.24.  The patient was initially given fluconazole 200 mg 1 p.o. daily, however, because of the persistent dysphagia p.o. fluconazole was discontinued and she was placed on fluconazole 200 mg IV q.24.  The patient also was given Magic mouthwash 1 teaspoonful and p.o. swish and swallow q.i.d.  She was, however, seen by me for the very first time in this admission on December 22, 2010, and during this encounter, she continued to complain about dysphagia and generalized abdominal discomfort, especially on the epigastric region. At this point, fluconazole was discontinued and the patient was placed on fluconazole IV 200 mg q.24.  She was also given a second mouthwash 20 cc p.o. t.i.d.  IV fluid was changed to D5 half normal saline to go at  a rate of 125 cc an hour and Dilaudid was changed to 1 mg IV q.3 p.r.n. The patient was continued on Protonix 40 mg IV q.12 hours.  On September 8, 2,012, the patient was found to have dropped hemoglobin of 7.8.  She was typed and crossed, transfused with 1 unit of packed RBCs and another unit was repeated on December 24, 2010.  The patient so far has made remarkable improvement.  Her affect has improved.  Mentation as improved.  She is more cheerful and more alert.  The patient, on December 23, 2010, the patient was found to have borderline blood pressure and she was given 250 cc bolus of normal saline IV stat.  She was also found to be hypokalemic and she will receive potassium chloride 10 mEq IV over one hour x2 doses.  The patient was evaluated by me today which is December 24, 2010.  She complained of mild epigastric discomfort.  PHYSICAL EXAMINATION: GENERAL: Pallor . VITAL SIGNS:  Blood pressure is 111/73, pulse is 123, respiratory rate is 20, temperature is 99.7; medically stable.  PLAN: Dr Leafy Ro, GI has been consulted for further evaluation of this patient for possible upper endoscopy.  Her current medications include the following; 1. D5 half normal saline to go at a rate of 125 cc an hour. 2. Fluconazole 200 mg IV q.24. 3. Dilantin 1 mg IV q.2 to 3 p.r.n. 4. Magic mouthwash 5 cc p.o. q.i.d. 5. Nystatin MR 20 cc t.i.d. 6. Pantoprazole 40 mg IV q.12. 7. Zofran 4 mg IV q.4 p.r.n. 8. Phenergan 12.5 mg IV q.6 p.r.n.  The patient will be followed and evaluated on day-to-day basis.     Talmage Nap, MD     CN/MEDQ  D:  12/24/2010  T:  12/24/2010  Job:  [id number]  Electronically Signed by Talmage Nap  on 12/24/2010 05:31:07 PM

## 2010-12-25 ENCOUNTER — Other Ambulatory Visit: Payer: Self-pay | Admitting: Internal Medicine

## 2010-12-25 DIAGNOSIS — K2100 Gastro-esophageal reflux disease with esophagitis, without bleeding: Secondary | ICD-10-CM

## 2010-12-25 DIAGNOSIS — R1319 Other dysphagia: Secondary | ICD-10-CM

## 2010-12-25 DIAGNOSIS — K208 Other esophagitis without bleeding: Secondary | ICD-10-CM

## 2010-12-25 LAB — PREALBUMIN: Prealbumin: 2 mg/dL — ABNORMAL LOW (ref 17.0–34.0)

## 2010-12-25 LAB — CBC
HCT: 30.6 % — ABNORMAL LOW (ref 36.0–46.0)
Hemoglobin: 9 g/dL — ABNORMAL LOW (ref 12.0–15.0)
MCH: 22.4 pg — ABNORMAL LOW (ref 26.0–34.0)
MCV: 76.1 fL — ABNORMAL LOW (ref 78.0–100.0)
RBC: 4.02 MIL/uL (ref 3.87–5.11)
WBC: 6.4 10*3/uL (ref 4.0–10.5)

## 2010-12-25 LAB — COMPREHENSIVE METABOLIC PANEL
AST: 57 U/L — ABNORMAL HIGH (ref 0–37)
BUN: 3 mg/dL — ABNORMAL LOW (ref 6–23)
CO2: 22 mEq/L (ref 19–32)
Calcium: 7.5 mg/dL — ABNORMAL LOW (ref 8.4–10.5)
Chloride: 105 mEq/L (ref 96–112)
Creatinine, Ser: <0.47 mg/dL — ABNORMAL LOW (ref 0.50–1.10)
Glucose, Bld: 102 mg/dL — ABNORMAL HIGH (ref 70–99)
Total Bilirubin: 0.2 mg/dL — ABNORMAL LOW (ref 0.3–1.2)

## 2010-12-25 LAB — DIFFERENTIAL
Lymphocytes Relative: 22 % (ref 12–46)
Lymphs Abs: 1.4 10*3/uL (ref 0.7–4.0)
Monocytes Relative: 5 % (ref 3–12)
Neutrophils Relative %: 71 % (ref 43–77)

## 2010-12-26 ENCOUNTER — Encounter: Payer: Self-pay | Admitting: Internal Medicine

## 2010-12-26 DIAGNOSIS — R633 Feeding difficulties, unspecified: Secondary | ICD-10-CM

## 2010-12-26 LAB — COMPREHENSIVE METABOLIC PANEL
AST: 64 U/L — ABNORMAL HIGH (ref 0–37)
Albumin: 1.7 g/dL — ABNORMAL LOW (ref 3.5–5.2)
Calcium: 7.4 mg/dL — ABNORMAL LOW (ref 8.4–10.5)
Chloride: 107 mEq/L (ref 96–112)
Creatinine, Ser: <0.47 mg/dL — ABNORMAL LOW (ref 0.50–1.10)
Sodium: 138 mEq/L (ref 135–145)

## 2010-12-26 LAB — CBC
MCH: 22.5 pg — ABNORMAL LOW (ref 26.0–34.0)
MCV: 76.5 fL — ABNORMAL LOW (ref 78.0–100.0)
Platelets: 280 10*3/uL (ref 150–400)
RDW: 19.6 % — ABNORMAL HIGH (ref 11.5–15.5)
WBC: 7.6 10*3/uL (ref 4.0–10.5)

## 2010-12-26 LAB — DIFFERENTIAL
Eosinophils Absolute: 0.2 10*3/uL (ref 0.0–0.7)
Eosinophils Relative: 2 % (ref 0–5)
Lymphs Abs: 1.6 10*3/uL (ref 0.7–4.0)
Monocytes Absolute: 0.3 10*3/uL (ref 0.1–1.0)

## 2010-12-26 LAB — HIV ANTIBODY (ROUTINE TESTING W REFLEX): HIV: NONREACTIVE

## 2010-12-27 DIAGNOSIS — K2100 Gastro-esophageal reflux disease with esophagitis, without bleeding: Secondary | ICD-10-CM

## 2010-12-27 DIAGNOSIS — R633 Feeding difficulties, unspecified: Secondary | ICD-10-CM

## 2010-12-27 DIAGNOSIS — K208 Other esophagitis without bleeding: Secondary | ICD-10-CM

## 2010-12-27 LAB — DIFFERENTIAL
Basophils Absolute: 0 10*3/uL (ref 0.0–0.1)
Eosinophils Absolute: 0.1 10*3/uL (ref 0.0–0.7)
Lymphs Abs: 2 10*3/uL (ref 0.7–4.0)
Monocytes Absolute: 0.4 10*3/uL (ref 0.1–1.0)
Neutro Abs: 3 10*3/uL (ref 1.7–7.7)

## 2010-12-27 LAB — COMPREHENSIVE METABOLIC PANEL
AST: 65 U/L — ABNORMAL HIGH (ref 0–37)
CO2: 26 mEq/L (ref 19–32)
Calcium: 7.6 mg/dL — ABNORMAL LOW (ref 8.4–10.5)
Creatinine, Ser: <0.47 mg/dL — ABNORMAL LOW (ref 0.50–1.10)

## 2010-12-27 LAB — GLUCOSE, CAPILLARY
Glucose-Capillary: 112 mg/dL — ABNORMAL HIGH (ref 70–99)
Glucose-Capillary: 117 mg/dL — ABNORMAL HIGH (ref 70–99)
Glucose-Capillary: 98 mg/dL (ref 70–99)

## 2010-12-27 LAB — CBC
MCH: 22.9 pg — ABNORMAL LOW (ref 26.0–34.0)
MCV: 76 fL — ABNORMAL LOW (ref 78.0–100.0)
Platelets: 266 10*3/uL (ref 150–400)
RDW: 19.7 % — ABNORMAL HIGH (ref 11.5–15.5)

## 2010-12-27 LAB — PREALBUMIN: Prealbumin: <3 mg/dL — ABNORMAL LOW (ref 17.0–34.0)

## 2010-12-27 LAB — PHOSPHORUS: Phosphorus: 2.2 mg/dL — ABNORMAL LOW (ref 2.3–4.6)

## 2010-12-27 LAB — CHOLESTEROL, TOTAL: Cholesterol: 137 mg/dL (ref 0–200)

## 2010-12-27 LAB — TRIGLYCERIDES: Triglycerides: 205 mg/dL — ABNORMAL HIGH (ref <150)

## 2010-12-28 ENCOUNTER — Ambulatory Visit: Payer: BC Managed Care – PPO | Admitting: Internal Medicine

## 2010-12-28 DIAGNOSIS — Z0289 Encounter for other administrative examinations: Secondary | ICD-10-CM

## 2010-12-28 LAB — GLUCOSE, CAPILLARY
Glucose-Capillary: 136 mg/dL — ABNORMAL HIGH (ref 70–99)
Glucose-Capillary: 128 mg/dL — ABNORMAL HIGH (ref 70–99)

## 2010-12-28 LAB — BASIC METABOLIC PANEL
BUN: 4 mg/dL — ABNORMAL LOW (ref 6–23)
CO2: 26 mEq/L (ref 19–32)
Chloride: 110 mEq/L (ref 96–112)
Creatinine, Ser: <0.47 mg/dL — ABNORMAL LOW (ref 0.50–1.10)
Potassium: 3.2 mEq/L — ABNORMAL LOW (ref 3.5–5.1)

## 2010-12-28 LAB — MAGNESIUM: Magnesium: 1.8 mg/dL (ref 1.5–2.5)

## 2010-12-29 DIAGNOSIS — K208 Other esophagitis without bleeding: Secondary | ICD-10-CM

## 2010-12-29 DIAGNOSIS — R633 Feeding difficulties, unspecified: Secondary | ICD-10-CM

## 2010-12-29 DIAGNOSIS — R1319 Other dysphagia: Secondary | ICD-10-CM

## 2010-12-29 LAB — TSH: TSH: 1.955 u[IU]/mL (ref 0.350–4.500)

## 2010-12-29 LAB — GLUCOSE, CAPILLARY
Glucose-Capillary: 105 mg/dL — ABNORMAL HIGH (ref 70–99)
Glucose-Capillary: 109 mg/dL — ABNORMAL HIGH (ref 70–99)
Glucose-Capillary: 92 mg/dL (ref 70–99)
Glucose-Capillary: 97 mg/dL (ref 70–99)

## 2010-12-29 LAB — BASIC METABOLIC PANEL
Calcium: 7.8 mg/dL — ABNORMAL LOW (ref 8.4–10.5)
Creatinine, Ser: <0.47 mg/dL — ABNORMAL LOW (ref 0.50–1.10)
Glucose, Bld: 89 mg/dL (ref 70–99)
Sodium: 138 mEq/L (ref 135–145)

## 2010-12-30 DIAGNOSIS — K208 Other esophagitis without bleeding: Secondary | ICD-10-CM

## 2010-12-30 DIAGNOSIS — K2100 Gastro-esophageal reflux disease with esophagitis, without bleeding: Secondary | ICD-10-CM

## 2010-12-30 DIAGNOSIS — R1319 Other dysphagia: Secondary | ICD-10-CM

## 2010-12-30 DIAGNOSIS — R633 Feeding difficulties, unspecified: Secondary | ICD-10-CM

## 2010-12-30 LAB — GLUCOSE, CAPILLARY
Glucose-Capillary: 81 mg/dL (ref 70–99)
Glucose-Capillary: 94 mg/dL (ref 70–99)

## 2010-12-30 NOTE — H&P (Signed)
NAME:  Jeanne Hawkins, Jeanne Hawkins NO.:  [id number]  MEDICAL RECORD NO.:  [id number]  LOCATION:  [facility]LED                         FACILITY:  [facility]  PHYSICIAN:  Della Goo, M.D. DATE OF BIRTH:  January 27, 1986  DATE OF ADMISSION:  12/21/2010 DATE OF DISCHARGE:                             HISTORY & PHYSICAL   DATE OF ADMISSION:  12/21/2010  PRIMARY CARE PHYSICIAN:  Unassigned, Dr. Vassie Moselle in [place], [place].  CHIEF COMPLAINT:  Painful swallowing.  HISTORY OF PRESENT ILLNESS:  This is a 25 year old female with a history of juvenile rheumatoid arthritis as well as polymyositis, who presents to the emergency department with complaints of dysphagia and odynophagia along with nausea.  She has had substernal pain and epigastric pain over the past week.  She states the pain is 10/10.  This is a sharp and burning pain.  The pain radiates into the back.  She had been seen in the emergency department 3 times this past week for her symptoms and it had been suggested that she had possible oral thrush.  Despite medications that have been given, the patient continued to have symptoms and has had poor intake of foods and liquids secondary to her pain with swallowing.  The patient was seen in the emergency department.  She was found to be tachycardic and mildly hypotensive and was administered IV fluids for fluid resuscitation with improvement in her heart rate.  The patient was referred for medical admission.  PAST MEDICAL HISTORY:  Significant for: 1. Juvenile rheumatoid arthritis since age of 89. 2. Polymyositis.  MEDICATIONS:  At this time include: 1. Humira injections weekly along with prednisone 15 mg once daily. 2. Motrin 800 mg 1 p.o. t.i.d. for pain.  The patient reports that she     does take regularly.  ALLERGIES:  No known drug allergies.  SOCIAL HISTORY:  The patient is single.  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  She is a nonsmoker, nondrinker.  No history of illicit drug usage.  FAMILY HISTORY:  Positive for a paternal cousin having multiple sclerosis.  REVIEW OF SYSTEMS:  Pertinents as mentioned above.  The patient does have weakness and nausea.  She denies having any vomiting or diarrhea. She denies having any fevers or chills.  Otherwise, review of systems is negative except for diffuse muscular and joint pain.  PHYSICAL EXAMINATION:  GENERAL:  This is a 25 year old pleasant, well- nourished, well-developed African American female who is in discomfort, but no acute distress. VITAL SIGNS:  Temperature 99.0, blood pressure 116/69, heart rate initially 130, now 86, respirations 16 to 18, O2 sats 96% to 100%. HEENT:  Normocephalic, atraumatic.  Pupils equally round and reactive to light.  Extraocular movements are intact.  Funduscopic benign.  There is no scleral icterus.  Nares are patent bilaterally.  Oropharynx reveals coated tongue, but this is not appear to be whitish or thrush like. This appears to be dry tongue exudate from dehydration. NECK:  Supple, full range of motion.  No thyromegaly, adenopathy, jugular venous distention. CARDIOVASCULAR:  Regular rate and rhythm.  Normal S1, S2.  No murmurs, gallops, or rubs appreciated. LUNGS:  Clear to auscultation bilaterally.  No rales, rhonchi, or wheezes. ABDOMEN:  Positive bowel sounds.  Soft, nontender, nondistended.  No hepatosplenomegaly. EXTREMITIES:  Without cyanosis, clubbing, or edema. NEUROLOGIC:  Nonfocal.  LABORATORY STUDIES:  White blood cell count 8.1, hemoglobin 9.0, hematocrit 31.0, platelets 419, MCV 72.4.  Sodium 138, potassium 3.5, chloride 99, CO2 22, BUN 14, creatinine less than 0.47, glucose 79, albumin 2.8, calcium 8.9.  Lipase level 27.  Urine hCG negative. Urinalysis positive for protein of 100, ketones greater than 80, and moderate bilirubin.  EKG reveals a sinus tachycardia.  ASSESSMENT:   A 25 year old female being admitted with: 1. Epigastric pain. 2. Odynophagia. 3. Nausea. 4. Dehydration. 5. Juvenile rheumatoid arthritis. 6. Polymyositis. 7. Microcytic anemia.  PLAN:  The patient will be admitted.  She will continue on IV fluids for rehydration and fluid resuscitation.  Antiemetic therapy has been ordered along with pain control therapies.  The patient will be placed empirically on fluconazole therapy at this time and magic mouthwash has also been ordered for oral discomfort.  Protonix therapy has also been ordered.  Her medications have been reviewed and her prednisone therapy will be continued.  However, this may need to be changed to an IV equivalent.  Her Motrin therapy will be suspended at this time.  The differential at this time does include possible oral/esophageal candidiasis versus esophageal gastritis and erosions from her medications combination of prednisone therapy and the Motrin therapy and less likely herpetic esophagitis.  However, these diagnoses cannot be delineated unless the patient undergoes upper endoscopy and a GI consultation will be placed in the a.m. for further evaluation.  An anemia panel also be ordered secondary to the patient's microcytic anemia.     Della Goo, M.D.     HJ/MEDQ  D:  12/21/2010  T:  12/21/2010  Job:  [id number]  Electronically Signed by Della Goo M.D. on 12/30/2010 09:55:13 PM

## 2010-12-30 NOTE — Discharge Summary (Unsigned)
Jeanne Hawkins, Jeanne Hawkins            ACCOUNT NO.:  [id number]  MEDICAL RECORD NO.:  [id number]  LOCATION:  1516                         FACILITY:  [facility]  PHYSICIAN:  Zannie Cove, MD     DATE OF BIRTH:  [birth year]  DATE OF ADMISSION:  12/21/2010 DATE OF DISCHARGE:                         DISCHARGE SUMMARY-REFERRING   PRIMARY CARE PHYSICIAN:  None.  RHEUMATOLOGIST:  Demetrios Loll, MD in [place], [place].  DISCHARGE DIAGNOSES: 1. Severe diffuse esophagitis with ulcerations. 2. Protein-calorie malnutrition. 3. Juvenile rheumatoid arthritis. 4. Gastroesophageal reflux disease. 5. Anemia of chronic disease. 6. Mild interstitial lung disease on CT of the chest, likely related     to rheumatoid arthritis.  CONSULTANTS:  Dr. Hedwig Morton. Brodie with [facility].  PROCEDURES: 1. EGD on December 25, 2010 per Dr. Juanda Chance showed severe diffuse     esophagitis, multiple superficial ulcerations and friability,     normal stomach and duodenum. 2. Chest KUB December 18, 2010, no acute findings, chronic basilar     pleural/parenchymal scarring. 3. Esophageal biopsy December 25, 2010, showed ulcer with no fungi,     dysplasia or malignancy.  The stains for immunohistochemistry for     cytomegalovirus herpes 1 and 2 were negative as well and PAS stains     for fungal elements were negative as well.  HOSPITAL COURSE:  Jeanne Hawkins is a very pleasant 25 year old African American female with history of juvenile rheumatoid arthritis/polymyositis on Humira, Motrin and prednisone for several years, presented to the hospital with worsening odynophagia. 1. Severe ulcerative esophagitis.  For her odynophagia, she underwent     the EGD with results as dictated above.  She had severe esophagitis     with multiple ulcerations, subsequently started on Magic mouth     wash, Carafate, PPI, narcotics and n.p.o. for symptom relief.     Initially was treated with fluconazole for the concern  for possible     candidiasis.  However, fungal cultures were negative from biopsy.     Subsequently this was discontinued.  She clinically improved     symptomatically with Carafate, PPI and fluconazole and her diet was     advanced from clears to full liquids, now tolerating soft diet with     good benefit.  In addition, she also received TNA in the hospital     due to protein-calorie malnutrition and being n.p.o. for a long     time.  She is being prescribed boost, which she has been tolerating     well in the hospital from a malnutrition standpoint. 2. Anemia of chronic disease.  She was transfused a unit of PRBC on     December 24, 2010, per one of my partners and since hemoglobin has     been stable in the 9 range.  Discharge condition is stable.  In     addition her esophagitis was felt to be secondary to her     gastroesophageal reflux disease with worsened by her prednisone and     Motrin.  Her Motrin has been discontinued.  At this point, the     patient would like to continue prednisone, which we would advise to  wean slowly as tolerated.  In addition also advised to continue PPI     b.i.d. at least for the next couple of months. 3. Juvenile RA.  As noted previously, Motrin has been discontinued due     to severe ulcerative esophagitis.  Prednisone dose is being     continued at the same dose.  Humira on hold in the hospital.  She     will follow up with Dr. Vassie Moselle in couple of weeks.     Zannie Cove, MD     PJ/MEDQ  D:  12/30/2010  T:  12/30/2010  Job:  [id number]  cc:   Demetrios Loll, MD Fax: [phone]

## 2011-01-01 LAB — VIRUS CULTURE

## 2011-01-07 ENCOUNTER — Telehealth: Payer: Self-pay | Admitting: Internal Medicine

## 2011-01-07 NOTE — Telephone Encounter (Signed)
Tried to call pt but no answering machine. I guess it took a lot out of her not being able to eat for 2 weeks.while in the hospital. She ought to try to resume some activity  As tolerated. She should not be losing any more weight. Boost is good , but regular food should be tried  As well.

## 2011-01-07 NOTE — Telephone Encounter (Signed)
Pt states that she was in the hospital and Dr. Juanda Chance saw her there, she has been out of the hospital for a week. She is calling stating that she still feels very tired and weak. She state she really has no appetite and is not eating well. She state she is drinking 2 bottles of boost a day and 1 small meal in the evening. She states she is drinking fluids but that she is on a new medication-Tramadol- and it is making her sweat. She is worried that it might be making her dehydrated. She wants to know if she should still be feeling so tired and weak. Dr. Juanda Chance please advise.

## 2011-01-08 NOTE — Telephone Encounter (Signed)
Attempted to call the patient .  No machine and no answer.  I will continue to try and reach the patient

## 2011-01-09 NOTE — Telephone Encounter (Signed)
Attempted to reach patient no machine and no answer.

## 2011-01-10 NOTE — Telephone Encounter (Signed)
Attempted to reach patient no machine and no answer. 

## 2011-01-11 NOTE — Telephone Encounter (Signed)
Attempted to reach patient no machine and no answer. 

## 2011-01-24 NOTE — Discharge Summary (Signed)
NAMEORCHID, GLASSBERG            ACCOUNT NO.:  [id number]  MEDICAL RECORD NO.:  [id number]  LOCATION:  1516                         FACILITY:  [facility]  PHYSICIAN:  Zannie Cove, MD     DATE OF BIRTH:  06/11/1985  DATE OF ADMISSION:  12/21/2010 DATE OF DISCHARGE:  12/30/2010                        DISCHARGE SUMMARY - REFERRING   PRIMARY CARE PHYSICIAN:  None.  RHEUMATOLOGIST:  Demetrios Loll, MD in [place], [place].  DISCHARGE DIAGNOSES: 1. Severe diffuse esophagitis with ulcerations. 2. Protein-calorie malnutrition. 3. Juvenile rheumatoid arthritis. 4. Gastroesophageal reflux disease. 5. Anemia of chronic disease. 6. Mild interstitial lung disease on CT of the chest, likely related     to rheumatoid arthritis.  CONSULTANTS:  Dr. Hedwig Morton. Brodie with [facility].  PROCEDURES: 1. EGD on December 25, 2010 per Dr. Juanda Chance showed severe diffuse     esophagitis, multiple superficial ulcerations and friability, normal stomach and duodenum. 2. Chest KUB December 18, 2010, no acute findings, chronic basilar     pleural/parenchymal scarring. 3. Esophageal biopsy December 25, 2010, showed ulcer with no fungi,     dysplasia or malignancy.  The stains for immunohistochemistry for     cytomegalovirus herpes 1 and 2 were negative as well and PAS stains     for fungal elements were negative as well.  HOSPITAL COURSE:  Ms. Jeanne Hawkins is a very pleasant 25 year old African American female with history of juvenile rheumatoid arthritis/polymyositis on Humira, Motrin and prednisone for several years, presented to the hospital with worsening odynophagia. 1. Severe ulcerative esophagitis.  For her odynophagia, she underwent     the EGD with results as dictated above.  She had severe esophagitis     with multiple ulcerations, subsequently started on Magic mouth     wash, Carafate, PPI, narcotics and n.p.o. for symptom relief.     Initially was treated with fluconazole for  the concern for possible     candidiasis.  However, fungal cultures were negative from biopsy.     Subsequently this was discontinued.  She clinically improved     symptomatically with Carafate, PPI and fluconazole and her diet was     advanced from clears to full liquids, now tolerating soft diet with     good benefit.  In addition, she also received TNA in the hospital     due to protein-calorie malnutrition and being n.p.o. for a long     time.  She is being prescribed boost, which she has been tolerating     well in the hospital from a malnutrition standpoint. 2. Anemia of chronic disease.  She was transfused a unit of PRBC on     December 24, 2010, per one of my partners and since hemoglobin has     been stable in the 9 range.  Discharge condition is stable.  In     addition her esophagitis was felt to be secondary to her     gastroesophageal reflux disease with worsened by her prednisone and     Motrin.  Her Motrin has been discontinued.  At this point, the     patient would like to continue prednisone, which we would advise to     wean  slowly as tolerated.  In addition also advised to continue PPI     b.i.d. at least for the next couple of months. 3. Juvenile RA.  As noted previously, Motrin has been discontinued due     to severe ulcerative esophagitis.  Prednisone dose is being     continued at the same dose.  Humira on hold in the hospital.  She     will follow up with Dr. Vassie Moselle in a couple of weeks.     Zannie Cove, MD     PJ/MEDQ  D:  12/30/2010  T:  01/04/2011  Job:  [id number]  cc:   Demetrios Loll, MD Fax: [phone]  Electronically Signed by Zannie Cove  on 01/24/2011 02:25:32 PM

## 2011-02-03 NOTE — Discharge Instructions (Signed)
Asthma Attacks, Prevention  HOW CAN ASTHMA BE PREVENTED?  Currently, there is no way to prevent asthma from starting. However, you can take steps to control the disease and prevent its symptoms after you have been diagnosed. Learn about your asthma and how to control it. Take an active role to control your asthma by working with your caregiver to create and follow an asthma action plan. An asthma action plan guides you in taking your medicines properly, avoiding factors that make your asthma worse, tracking your level of asthma control, responding to worsening asthma, and seeking emergency care when needed. To track your asthma, keep records of your symptoms, check your peak flow number using a peak flow meter (handheld device that shows how well air moves out of your lungs), and get regular asthma checkups.   Other ways to prevent asthma attacks include:   Use medicines as your caregiver directs.    Identify and avoid things that make your asthma worse (as much as you can).    Keep track of your asthma symptoms and level of control.    Get regular checkups for your asthma.    With your caregiver, write a detailed plan for taking medicines and managing an asthma attack. Then be sure to follow your action plan. Asthma is an ongoing condition that needs regular monitoring and treatment.    Identify and avoid asthma triggers. A number of outdoor allergens and irritants (pollen, mold, cold air, air pollution) can trigger asthma attacks. Find out what causes or makes your asthma worse, and take steps to avoid those triggers (see below).    Monitor your breathing. Learn to recognize warning signs of an attack, such as slight coughing, wheezing or shortness of breath. However, your lung function may already decrease before you notice any signs or symptoms, so regularly measure and record your peak airflow with a home peak flow meter.    Identify and treat attacks early. If you act quickly, you're less likely to  have a severe attack. You will also need less medicine to control your symptoms. When your peak flow measurements decrease and alert you to an upcoming attack, take your medicine as instructed, and immediately stop any activity that may have triggered the attack. If your symptoms do not improve, get medical help.    Pay attention to increasing quick-relief inhaler use. If you find yourself relying on your quick-relief inhaler (such as albuterol), your asthma is not under control. See your caregiver about adjusting your treatment.   IDENTIFY AND CONTROL FACTORS THAT MAKE YOUR ASTHMA WORSE  A number of common things can set off or make your asthma symptoms worse (asthma triggers). Keep track of your asthma symptoms for several weeks, detailing all the environmental and emotional factors that are linked with your asthma. When you have an asthma attack, go back to your asthma diary to see which factor, or combination of factors, might have contributed to it. Once you know what these factors are, you can take steps to control many of them.   Allergies: If you have allergies and asthma, it is important to take asthma prevention steps at home. Asthma attacks (worsening of asthma symptoms) can be triggered by allergies, which can cause temporary increased inflammation of your airways. Minimizing contact with the substance to which you are allergic will help prevent an asthma attack.  Animal Dander:    Some people are allergic to the flakes of skin or dried saliva from animals with fur or feathers. Keep   these pets out of your home.    If you can't keep a pet outdoors, keep the pet out of your bedroom and other sleeping areas at all times, and keep the door closed.    Remove carpets and furniture covered with cloth from your home. If that is not possible, keep the pet away from fabric-covered furniture and carpets.   Dust Mites:   Many people with asthma are allergic to dust mites. Dust mites are tiny bugs that are found  in every home, in mattresses, pillows, carpets, fabric-covered furniture, bedcovers, clothes, stuffed toys, fabric, and other fabric-covered items.    Cover your mattress in a special dust-proof cover.    Cover your pillow in a special dust-proof cover, or wash the pillow each week in hot water. Water must be hotter than 130 F to kill dust mites. Cold or warm water used with detergent and bleach can also be effective.    Wash the sheets and blankets on your bed each week in hot water.    Try not to sleep or lie on cloth-covered cushions.    Call ahead when traveling and ask for a smoke-free hotel room. Bring your own bedding and pillows, in case the hotel only supplies feather pillows and down comforters, which may contain dust mites and cause asthma symptoms.    Remove carpets from your bedroom and those laid on concrete, if you can.    Keep stuffed toys out of the bed, or wash the toys weekly in hot water or cooler water with detergent and bleach.   Cockroaches:   Many people with asthma are allergic to the droppings and remains of cockroaches.    Keep food and garbage in closed containers. Never leave food out.    Use poison baits, traps, powders, gels, or paste (for example, boric acid).    If a spray is used to kill cockroaches, stay out of the room until the odor goes away.   Indoor Mold:   Fix leaky faucets, pipes, or other sources of water that have mold around them.    Clean moldy surfaces with a cleaner that has bleach in it.   Pollen and Outdoor Mold:   When pollen or mold spore counts are high, try to keep your windows closed.    Stay indoors with windows closed from late morning to afternoon, if you can. Pollen and some mold spore counts are highest at that time.    Ask your caregiver whether you need to take or increase anti-inflammatory medicine before your allergy season starts.   Irritants:    Tobacco smoke is an irritant. If you smoke, ask your caregiver how you can quit. Ask  family members to quit smoking, too. Do not allow smoking in your home or car.    If possible, do not use a wood-burning stove, kerosene heater, or fireplace. Minimize exposure to all sources of smoke, including incense, candles, fires, and fireworks.    Try to stay away from strong odors and sprays, such as perfume, talcum powder, hair spray, and paints.    Decrease humidity in your home and use an indoor air cleaning device. Reduce indoor humidity to below 60 percent. Dehumidifiers or central air conditioners can do this.    Try to have someone else vacuum for you once or twice a week, if you can. Stay out of rooms while they are being vacuumed and for a short while afterward.    If you vacuum, use a dust mask   from a hardware store, a double-layered or microfilter vacuum cleaner bag, or a vacuum cleaner with a HEPA filter.    Sulfites in foods and beverages can be irritants. Do not drink beer or wine, or eat dried fruit, processed potatoes, or shrimp if they cause asthma symptoms.    Cold air can trigger an asthma attack. Cover your nose and mouth with a scarf on cold or windy days.    Several health conditions can make asthma more difficult to manage, including runny nose, sinus infections, reflux disease, psychological stress, and sleep apnea. Your caregiver will treat these conditions, as well.    Avoid close contact with people who have a cold or the flu, since your asthma symptoms may get worse if you catch the infection from them. Wash your hands thoroughly after touching items that may have been handled by people with a respiratory infection.    Get a flu shot every year to protect against the flu virus, which often makes asthma worse for days or weeks. Also get a pneumonia shot once every five to 10 years.   Drugs:   Aspirin and other painkillers can cause asthma attacks. 10% to 20% of people with asthma have sensitivity to aspirin or a group of painkillers called non-steroidal anti-inflammatory  drugs (NSAIDS), such as ibuprofen and naproxen. These drugs are used to treat pain and reduce fevers. Asthma attacks caused by any of these medicines can be severe and even fatal. These drugs must be avoided in people who have known aspirin sensitive asthma. Products with acetaminophen are considered safe for people who have asthma. It is important that people with aspirin sensitivity read labels of all over-the-counter drugs used to treat pain, colds, coughs, and fever.    Beta blockers and ACE inhibitors are other drugs which you should discuss with your caregiver, in relation to your asthma.   ALLERGY SKIN TESTING   Ask your asthma caregiver about allergy skin testing or blood testing (RAST test) to identify the allergens to which you are sensitive. If you are found to have allergies, allergy shots (immunotherapy) for asthma may help prevent future allergies and asthma. With allergy shots, small doses of allergens (substances to which you are allergic) are injected under your skin on a regular schedule. Over a period of time, your body may become used to the allergen and less responsive with asthma symptoms. You can also take measures to minimize your exposure to those allergens.  EXERCISE   If you have exercise-induced asthma, or are planning vigorous exercise, or exercise in cold, humid, or dry environments, prevent exercise-induced asthma by following your caregiver's advice regarding asthma treatment before exercising.  Document Released: 03/20/2009   [employer] Patient Information 2012 [employer].

## 2011-02-03 NOTE — ED Provider Notes (Signed)
HPI  Patient presents to the ER with a chief complaint of head congestion and stuffy nose constant for last one weeks time. she had associated shortness of breath today. She wasn't certain if it was her asthma or something else.  She has used her Proventil inhaler occasionally over last one weeks time . she's had a nonproductive cough denies any chest pain no diaphoresis no nausea or vomiting    Review of Systems  Constitutional:Denies fever, headache  Eye: no blurred vision or double vision  ENT:  runny nose,no sore throat   Respiratory: cough, chest congestion and shortness of breath  Cardiovascular: no chest pain or palpitations  GI: no abdominal pain,  nausea, vomiting, constipation or diarrhea  GU: no dysuria, urgency, frequency  Musculoskelatal: no neck pain or Back pain   Remainder of systems reviewed and negative.  Nursing notes reviewed and I agree, except as otherwise noted. vitals signs reviewed. Allergies, Past Medical and Surgical History reviewed. Social and Family History reviewed. and I agree, except as otherwise noted         Physical Exam  GENERAL: Patient appeared well-developed, obese, vital signs reviewed.  MENTAL STATUS: Patient is awake and alert   HEAD AND FACE :Head is normal cephalic.   EYES: eyes lids and conjunctiva appear normal.  ENT : Ears TMs are intact without erythema Fluid present bilaterally .  Nose has a  red appearing nasal mucosa.  Throat has Normal appearing pharyngeal mucosa.  No sinus tenderness to palpation.  No cervical adenopathy  NECK: Neck without any masses.    LYMPHATIC: No cervical Lymphadenopathy,    CV: Heart regular rate and rhythm without murmur, S3 or S4.  No JVD . No carotid bruits. No pedal edema. Pulses equal bilaterally.  LUNGS: Lungs  decreased breath sounds with rare  Wheezing, no  rales, or rhonchi. Normal respiratory effort.  CHEST WALL: normal appearance., normal motion  EXTREMITIES: Extremities moves all 4 Extremities   SKIN: Skin shows no  rashes    PSYCHIATRIC:mood and affect normal    NEUROLOGIC: no weakness or numbness noted   This is a computerized dictation. I have made every effort to proofread this chart, however, transcription errors may exis t.       Procedures      MDM  Patient had a upper respiratory tract infection for the last weeks time. She developed wheezing today. she is in no respiratory distress.  We'll give her a dose of prednisone .have her increase use of her albuterol inhaler and Keflex for possible bacterial source she'll follow her family doctor in one to 3 days    Labs      Radiology      EKG Interpretation     Tomasa Hosteller, DO  02/03/11 [id number]

## 2011-02-04 ENCOUNTER — Inpatient Hospital Stay
Admit: 2011-02-04 | Discharge: 2011-02-04 | Disposition: A | Discharge status: Home / Self Care | Attending: Emergency Medicine

## 2011-02-04 MED ORDER — AEROCHAMBER PLUS MISC
Freq: Four times a day (QID) | 3.00 refills | 30.00000 days | Status: DC
Start: 2011-02-04 — End: 2015-11-14

## 2011-02-04 MED ORDER — PREDNISONE 20 MG PO TABS
20 | Freq: Once | ORAL | Status: AC
Start: 2011-02-04 — End: 2011-02-03
  Administered 2011-02-04: 02:00:00 40 mg via ORAL

## 2011-02-04 MED ORDER — PREDNISONE 20 MG PO TABS
20 | ORAL | Status: AC
Start: 2011-02-04 — End: 2011-02-03

## 2011-02-04 MED ORDER — CEPHALEXIN 500 MG PO CAPS
500 | ORAL_CAPSULE | Freq: Four times a day (QID) | ORAL | Status: AC
Start: 2011-02-04 — End: 2011-02-13

## 2011-02-04 MED FILL — PREDNISONE 20 MG PO TABS: 20 mg | ORAL | Qty: 2

## 2011-03-14 ENCOUNTER — Telehealth: Payer: Self-pay | Admitting: *Deleted

## 2011-03-14 NOTE — Telephone Encounter (Signed)
ATC the pt to set-up new consult with PW in [place]. No answer and no voicemail. WCB. Pt is being referred by Dr. Beaulah Dinning. Records were faxed to [facility]. I will keep them in crystals look-at until appt is made. Carron Curie, CMA

## 2011-03-18 NOTE — Telephone Encounter (Signed)
ATCx2, NA, no voicemail. See below. WCB. Carron Curie, CMA

## 2011-03-19 NOTE — Telephone Encounter (Signed)
atcx3 NA, no voicemail. WCB. Carron Curie, CMA

## 2011-04-04 NOTE — Telephone Encounter (Signed)
Still Unable to reach the patient. I called Dr. Cyndee Brightly office since they were referring and advised hem that we were not able to get in contact with the patient. Carron Curie, CMA

## 2011-05-05 ENCOUNTER — Encounter (HOSPITAL_BASED_OUTPATIENT_CLINIC_OR_DEPARTMENT_OTHER): Payer: Self-pay

## 2011-05-05 ENCOUNTER — Emergency Department (INDEPENDENT_AMBULATORY_CARE_PROVIDER_SITE_OTHER): Payer: BC Managed Care – PPO

## 2011-05-05 ENCOUNTER — Emergency Department (HOSPITAL_BASED_OUTPATIENT_CLINIC_OR_DEPARTMENT_OTHER)
Admission: EM | Admit: 2011-05-05 | Discharge: 2011-05-05 | Disposition: A | Discharge status: Home / Self Care | Payer: BC Managed Care – PPO | Attending: Emergency Medicine | Admitting: Emergency Medicine

## 2011-05-05 DIAGNOSIS — R05 Cough: Secondary | ICD-10-CM

## 2011-05-05 DIAGNOSIS — J40 Bronchitis, not specified as acute or chronic: Secondary | ICD-10-CM

## 2011-05-05 DIAGNOSIS — M069 Rheumatoid arthritis, unspecified: Secondary | ICD-10-CM | POA: Insufficient documentation

## 2011-05-05 DIAGNOSIS — R059 Cough, unspecified: Secondary | ICD-10-CM | POA: Insufficient documentation

## 2011-05-05 HISTORY — DX: Anemia, unspecified: D64.9

## 2011-05-05 LAB — DIFFERENTIAL
Basophils Relative: 0 % (ref 0–1)
Eosinophils Absolute: 0 10*3/uL (ref 0.0–0.7)
Lymphs Abs: 0.8 10*3/uL (ref 0.7–4.0)
Neutro Abs: 8.3 10*3/uL — ABNORMAL HIGH (ref 1.7–7.7)
Neutrophils Relative %: 89 % — ABNORMAL HIGH (ref 43–77)

## 2011-05-05 LAB — BASIC METABOLIC PANEL
Calcium: 8.6 mg/dL (ref 8.4–10.5)
GFR calc non Af Amer: >90 mL/min (ref >90)
Potassium: 4.5 mEq/L (ref 3.5–5.1)
Sodium: 137 mEq/L (ref 135–145)

## 2011-05-05 LAB — CBC
Hemoglobin: 9.6 g/dL — ABNORMAL LOW (ref 12.0–15.0)
MCH: 26.2 pg (ref 26.0–34.0)
Platelets: 324 10*3/uL (ref 150–400)
RBC: 3.66 MIL/uL — ABNORMAL LOW (ref 3.87–5.11)

## 2011-05-05 MED ORDER — GUAIFENESIN-CODEINE 100-10 MG/5ML PO SYRP: 5.0000 mL | ORAL_SOLUTION | Freq: Three times a day (TID) | ORAL | Status: AC | PRN

## 2011-05-05 MED ORDER — AZITHROMYCIN 250 MG PO TABS: 250.0000 mg | ORAL_TABLET | Freq: Every day | ORAL | Status: AC

## 2011-05-05 NOTE — ED Notes (Signed)
Pt c/o cough with clear sputum for past 3 weeks.  Pt denies fever.  Pt denies N/V/D.  Other family members have had similar SX.

## 2011-05-05 NOTE — ED Provider Notes (Signed)
History     CSN: [id number]  Arrival date & time 05/05/11  7829   First MD Initiated Contact with Patient 05/05/11 [phone]      Chief Complaint  Patient presents with  . Cough    (Consider location/radiation/quality/duration/timing/severity/associated sxs/prior treatment) HPI Comments: History of RA, has been coughing for 3 weeks.  No better with otc meds.  Has been anemic in the past and mom wants to have her "iron levels" checked.  Patient is a 26 y.o. female presenting with cough. The history is provided by the patient.  Cough This is a new problem. Episode onset: 3 weeks ago. The problem occurs constantly. The problem has been gradually worsening. The cough is productive of sputum. There has been no fever. She has tried cough syrup for the symptoms. The treatment provided no relief.    Past Medical History  Diagnosis Date  . Arthritis, rheumatoid   . Polymyositis   . Anemia     History reviewed. No pertinent past surgical history.  No family history on file.  History  Substance Use Topics  . Smoking status: Never Smoker   . Smokeless tobacco: Not on file  . Alcohol Use: No    OB History    Grav Para Term Preterm Abortions TAB SAB Ect Mult Living                  Review of Systems  Respiratory: Positive for cough.   All other systems reviewed and are negative.    Allergies  Tramadol  Home Medications   Current Outpatient Rx  Name Route Sig Dispense Refill  . OMEPRAZOLE 20 MG PO CPDR Oral Take 1 capsule (20 mg total) by mouth daily. 30 capsule 0  . PREDNISONE 10 MG PO TABS Oral Take 15 mg by mouth daily.      [name] RANITIDINE HCL 150 MG PO CAPS Oral Take 1 capsule (150 mg total) by mouth daily. 30 capsule 0    BP 128/80  Pulse 72  Temp(Src) 98.3 F (36.8 C) (Oral)  Resp 15  Ht 5\' 6"  (1.676 m)  Wt 120 lb (54.432 kg)  BMI 19.37 kg/m2  SpO2 100%  LMP 01/03/2011  Physical Exam  Nursing note and vitals reviewed. Constitutional: She is oriented to  person, place, and time. She appears well-developed and well-nourished. No distress.  HENT:  Head: Normocephalic and atraumatic.  Neck: Normal range of motion. Neck supple.  Cardiovascular: Normal rate and regular rhythm.   No murmur heard. Pulmonary/Chest: Effort normal and breath sounds normal. No respiratory distress. She has no wheezes.  Abdominal: Soft. Bowel sounds are normal.  Musculoskeletal:       Hands show ulnar deviation consistent with RA.  Neurological: She is alert and oriented to person, place, and time.  Skin: Skin is warm and dry. She is not diaphoretic.    ED Course  Procedures (including critical care time)   Labs Reviewed  CBC  DIFFERENTIAL  BASIC METABOLIC PANEL   Dg Chest 2 View  05/05/2011  *RADIOLOGY REPORT*  Clinical Data: Cough  CHEST - 2 VIEW  Comparison: 01/31/2010  Findings: Diffusely increased interstitial lung markings noted, without focal pulmonary opacity.  This is mildly increased since the previous study diffusely.  Areas of central and prominently bilateral lower lobe bronchiectasis noted.  Trace pleural effusions or thickening noted.  Heart size is normal. No acute osseous finding.  IMPRESSION: Mild increase in diffuse interstitial increased lung markings and bronchiectasis.  Does the patient have  a history of cystic fibrosis?  Alternatively, this may be seen with rheumatoid arthritis as provided previous histories.  Original Report Authenticated By: Harrel Lemon, M.D.     No diagnosis found.    MDM  The Hb is 9.6, which is the best it has been in months.  Chest xray does not show any acute change.        Geoffery Lyons, MD 05/05/11 1140

## 2011-07-30 ENCOUNTER — Ambulatory Visit: Admit: 2011-07-30 | Discharge: 2011-07-30 | Payer: PRIVATE HEALTH INSURANCE | Primary: Oncology

## 2011-07-30 DIAGNOSIS — L732 Hidradenitis suppurativa: Secondary | ICD-10-CM

## 2011-07-30 MED ORDER — doxycycline (VIBRAMYCIN) 100 MG capsule
100 | ORAL_CAPSULE | Freq: Two times a day (BID) | ORAL | Status: AC
Start: 2011-07-30 — End: 2018-10-21

## 2011-07-30 NOTE — Unmapped (Signed)
Subjective  HPI:   Patient ID: Maria Stevenson is a 26 y.o. female.    Chief Complaint:  Chief Complaint   Patient presents with   ??? Sweating under arms and breast     1. Many year hx of tender pustular lesions over the axilla, occ few under breast and groin. Recently groin lesions were excised by OBGYN after no improvement with 2 week doxycylcine course.   ROS:   Review of SystemsDenies fever/chills. Allergies reviewed     Objective:   Physical Exam  Derm Physical Exam:  Examination was performed of the following were unremarkable: psych/neuro, head/face, abdomen, back, RUE and LUE    Abnormalities noted include: breast/axilla/chest    1. Multiple few cm dark brown plaques over axilla with few 1cm or less draining ulcers    Refused lower body and groin assessment   Assessment/Plan:   1. HS vs Furunculosis, no active lesions today  - begin Doxycylcine 100mg  BID. Counseled on GI upset, HA, heartburn, photosensitivity, pregnancy avoidance, how to avoid these side effects and when to notify.   -bp wash daily. Counseled on bleaching irrition.  rtc 3 months

## 2011-10-29 ENCOUNTER — Encounter: Payer: PRIVATE HEALTH INSURANCE | Primary: Oncology

## 2011-10-29 ENCOUNTER — Inpatient Hospital Stay
Admit: 2011-10-29 | Discharge: 2011-10-29 | Disposition: A | Discharge status: Home / Self Care | Attending: Emergency Medicine

## 2011-10-29 LAB — URINALYSIS WITH REFLEX TO CULTURE
Bilirubin Urine: NEGATIVE mg/dL
Glucose, Ur: NEGATIVE mg/dL
Ketones, Urine: NEGATIVE mg/dL
Leukocyte Esterase, Urine: NEGATIVE
Nitrite, Urine: NEGATIVE
Protein, UA: NEGATIVE mg/dL
Specific Gravity, UA: >=1.03 (ref 1.005–1.030)
Urobilinogen, Urine: 0.2 E.U./dL (ref <2.0)
pH, UA: 6 (ref 5.0–8.0)

## 2011-10-29 LAB — HEPATIC FUNCTION PANEL
ALT: 15 U/L (ref 10–40)
AST: 16 U/L (ref 15–37)
Albumin: 4.1 g/dL (ref 3.4–5.0)
Alkaline Phosphatase: 99 U/L (ref 45–129)
Bilirubin, Direct: 0.2 mg/dL (ref 0.00–0.30)
Bilirubin, Indirect: 0.2 mg/dL (ref 0.0–1.0)
Total Bilirubin: 0.4 mg/dL (ref 0.00–1.00)
Total Protein: 7.7 g/dL (ref 6.4–8.2)

## 2011-10-29 LAB — CBC WITH AUTO DIFFERENTIAL
Basophils %: 0.5 %
Basophils Absolute: 0.1 10*3/uL (ref 0.0–0.2)
Eosinophils %: 0.9 %
Eosinophils Absolute: 0.2 10*3/uL (ref 0.0–0.6)
Hematocrit: 34.4 % — ABNORMAL LOW (ref 36.0–48.0)
Hemoglobin: 11.3 g/dL — ABNORMAL LOW (ref 12.0–16.0)
Lymphocytes %: 25.8 %
Lymphocytes Absolute: 4.3 10*3/uL (ref 1.0–5.1)
MCH: 28.7 pg (ref 26.0–34.0)
MCHC: 32.9 g/dL (ref 31.0–36.0)
MCV: 87.4 fL (ref 80.0–100.0)
MPV: 8.9 fL (ref 5.0–10.5)
Monocytes %: 0.7 %
Monocytes Absolute: 0.1 10*3/uL (ref 0.0–1.3)
Neutrophils %: 72.1 %
Neutrophils Absolute: 11.9 10*3/uL — ABNORMAL HIGH (ref 1.7–7.7)
Platelets: 282 10*3/uL (ref 135–450)
RBC: 3.94 M/uL — ABNORMAL LOW (ref 4.00–5.20)
RDW: 14.8 % (ref 12.4–15.4)
WBC: 16.5 10*3/uL — ABNORMAL HIGH (ref 4.0–11.0)

## 2011-10-29 LAB — POCT VENOUS
CO2: 20 mEq/L — ABNORMAL LOW (ref 21–32)
Calcium, Ionized: 1.16 mmol/L (ref 1.12–1.32)
GFR African American: >60 (ref >60)
GFR Non-African American: >60 (ref >60)
POC BUN: 13 mg/dL (ref 7–18)
POC Chloride: 107 mEq/L (ref 99–110)
POC Creatinine: 0.8 mg/dL (ref 0.6–1.1)
POC Glucose: 139 mg/dl — ABNORMAL HIGH (ref 70–99)
POC Potassium: 3.7 mEq/L (ref 3.5–5.1)
POC Sodium: 142 mEq/L (ref 136–145)

## 2011-10-29 LAB — PREGNANCY, URINE: Pregnancy, Urine: NEGATIVE

## 2011-10-29 LAB — MICROSCOPIC URINALYSIS

## 2011-10-29 LAB — AMYLASE: Amylase: 50 U/L (ref 25–115)

## 2011-10-29 MED ORDER — SODIUM CHLORIDE 0.9 % IV SOLN
0.9 | INTRAVENOUS | Status: DC
Start: 2011-10-29 — End: 2011-10-29
  Administered 2011-10-29: 04:00:00 100 via INTRAVENOUS

## 2011-10-29 MED ORDER — NAPROXEN 500 MG PO TABS
500 | ORAL_TABLET | Freq: Two times a day (BID) | ORAL | 0.00 refills | 30.00000 days | Status: DC
Start: 2011-10-29 — End: 2012-12-22

## 2011-10-29 MED ORDER — ONDANSETRON 4 MG PO TBDP
4 | ORAL_TABLET | Freq: Three times a day (TID) | ORAL | 0.00 refills | 10.00000 days | Status: DC | PRN
Start: 2011-10-29 — End: 2015-11-14

## 2011-10-29 MED ORDER — OXYCODONE-ACETAMINOPHEN 5-325 MG PO TABS
5-325 | ORAL_TABLET | ORAL | 0.00 refills | 19.00000 days | Status: AC | PRN
Start: 2011-10-29 — End: 2011-11-05

## 2011-10-29 MED ORDER — IOVERSOL 68 % IV SOLN
68 | Freq: Once | INTRAVENOUS | Status: AC | PRN
Start: 2011-10-29 — End: 2011-10-29
  Administered 2011-10-29: 06:00:00 100 mL via INTRAVENOUS

## 2011-10-29 MED ORDER — SODIUM CHLORIDE 0.9 % IV SOLN
0.9 | INTRAVENOUS | Status: AC
Start: 2011-10-29 — End: 2011-10-29

## 2011-10-29 MED FILL — SODIUM CHLORIDE 0.9 % IV SOLN: 0.9 % | INTRAVENOUS | Qty: 1000

## 2011-10-29 NOTE — Discharge Instructions (Signed)
Ovarian Cyst  The ovaries are small organs that are on each side of the uterus. The ovaries are the organs that produce the female hormones, estrogen and progesterone. An ovarian cyst is a sac filled with fluid that can vary in its size. It is normal for a small cyst to form in women who are in the childbearing age and who have menstrual periods. This type of cyst is called a follicle cyst that becomes an ovulation cyst (corpus luteum cyst) after it produces the women's egg. It later goes away on its own if the woman does not become pregnant. There are other kinds of ovarian cysts that may cause problems and may need to be treated. The most serious problem is a cyst with cancer. It should be noted that menopausal women who have an ovarian cyst are at a higher risk of it being a cancer cyst. They should be evaluated very quickly, thoroughly and followed closely. This is especially true in menopausal women because of the high rate of ovarian cancer in women in menopause.  CAUSES AND TYPES OF OVARIAN CYSTS:   FUNCTIONAL CYST: The follicle/corpus luteum cyst is a functional cyst that occurs every month during ovulation with the menstrual cycle. They go away with the next menstrual cycle if the woman does not get pregnant. Usually, there are no symptoms with a functional cyst.   ENDOMETRIOMA CYST: This cyst develops from the lining of the uterus tissue. This cyst gets in or on the ovary. It grows every month from the bleeding during the menstrual period. It is also called a "chocolate cyst" because it becomes filled with blood that turns brown. This cyst can cause pain in the lower abdomen during intercourse and with your menstrual period.   CYSTADENOMA CYST: This cyst develops from the cells on the outside of the ovary. They usually are not cancerous. They can get very big and cause lower abdomen pain and pain with intercourse. This type of cyst can twist on itself, cut off its blood supply and cause severe pain. It  also can easily rupture and cause a lot of pain.   DERMOID CYST: This type of cyst is sometimes found in both ovaries. They are found to have different kinds of body tissue in the cyst. The tissue includes skin, teeth, hair, and/or cartilage. They usually do not have symptoms unless they get very big. Dermoid cysts are rarely cancerous.   POLYCYSTIC OVARY: This is a rare condition with hormone problems that produces many small cysts on both ovaries. The cysts are follicle-like cysts that never produce an egg and become a corpus luteum. It can cause an increase in body weight, infertility, acne, increase in body and facial hair and lack of menstrual periods or rare menstrual periods. Many women with this problem develop type 2 diabetes. The exact cause of this problem is unknown. A polycystic ovary is rarely cancerous.   THECA LUTEIN CYST: Occurs when too much hormone (human chorionic gonadotropin) is produced and over-stimulates the ovaries to produce an egg. They are frequently seen when doctors stimulate the ovaries for invitro-fertilization (test tube babies).   LUTEOMA CYST: This cyst is seen during pregnancy. Rarely it can cause an obstruction to the birth canal during labor and delivery. They usually go away after delivery.  SYMPTOMS    Pelvic pain or pressure.   Pain during sexual intercourse.   Increasing girth (swelling) of the abdomen.   Abnormal menstrual periods.   Increasing pain with menstrual periods.     You stop having menstrual periods and you are not pregnant.  DIAGNOSIS   The diagnosis can be made during:   Routine or annual pelvic examination (common).   Ultrasound.   X-ray of the pelvis.   CT Scan.   MRI.   Blood tests.  TREATMENT    Treatment may only be to follow the cyst monthly for 2 to 3 months with your caregiver. Many go away on their own, especially functional cysts.   May be aspirated (drained) with a long needle with ultrasound, or by laparoscopy (inserting a tube into  the pelvis through a small incision).   The whole cyst can be removed by laparoscopy.   Sometimes the cyst may need to be removed through an incision in the lower abdomen.   Hormone treatment is sometimes used to help dissolve certain cysts.   Birth control pills are sometimes used to help dissolve certain cysts.  HOME CARE INSTRUCTIONS   Follow your caregiver's advice regarding:   Medicine.   Follow up visits to evaluate and treat the cyst.   You may need to come back or make an appointment with another caregiver, to find the exact cause of your cyst, if your caregiver is not a gynecologist.   Get your yearly and recommended pelvic examinations and Pap tests.   Let your caregiver know if you have had an ovarian cyst in the past.  SEEK MEDICAL CARE IF:    Your periods are late, irregular, they stop, or are painful.   Your stomach (abdomen) or pelvic pain does not go away.   Your stomach becomes larger or swollen.   You have pressure on your bladder or trouble emptying your bladder completely.   You have painful sexual intercourse.   You have feelings of fullness, pressure, or discomfort in your stomach.   You lose weight for no apparent reason.   You feel generally ill.   You become constipated.   You lose your appetite.   You develop acne.   You have an increase in body and facial hair.   You are gaining weight, without changing your exercise and eating habits.   You think you are pregnant.  SEEK IMMEDIATE MEDICAL CARE IF:    You have increasing abdominal pain.   You feel sick to your stomach (nausea) and/or vomit.   You develop a fever that comes on suddenly.   You develop abdominal pain during a bowel movement.   Your menstrual periods become heavier than usual.  Document Released: 04/01/2005 Document Revised: 03/21/2011 Document Reviewed: 02/02/2009  ExitCare Patient Information 2012 [place], [place].    D/C home  Percocet, zofran, Naprosyn   Follow up with your OB/GYN

## 2011-10-29 NOTE — ED Provider Notes (Addendum)
HPI  Patient is a morbidly obese 26 year old female who comes in with complaints of abdominal pain that started last night no associated nausea or vomiting she feels that she has gas she said a bowel movement already earlier today she denies vomiting or fever no previous abdominal surgeries she is a non-insulin-dependent diabetic she denies chest pain or shortness of breath associated with this.  No localizing symptoms of abdominal discomfort  Review of Systems  Remainder of ROS reviewed and Negative.          Nursing notes reviewed and agree with above.  Physical Exam  BP 99/62  Pulse 102  Temp(Src) 98.4 F (36.9 C) (Oral)  Resp 16  Ht 5\' 4"  (1.626 m)  Wt 252 lb (114.306 kg)  BMI 43.23 kg/m2  SpO2 98%  LMP 10/21/2011  Patient appears comfortable, age appropriate.  Interacts with the examiner in a normal age appropriate fashion.  Pupils round reactive to light, nonicteric.  Clear speech, moist mucous membranes, tongue midline. No elevation of floor of mouth.  No significant neck adenopathy, trachea midline, no obvious thyromegaly.  Neck supple, no signs of meningismus.  Lungs clear bilaterally. No wheezes, crackles, or rhonchi.  Heart regular rate and rhythm. No murmurs, gallops, or rubs.  Abdomen soft, nontender, nondistended. No hepatosplenomegaly. No pulsatile masses.  Abdomen without rebound, guarding, or signs of peritonitis.  Nontender at Mcburney's point. No Murphy sign.  Skin without rashes or any other lesions.   Moves all 4 extremities purposefully.  No back tenderness.  Procedures    MDM  I discussed the case with Dr. Paulo Fruit showed the patient was hemodynamic stable and could go home with Percocet and followup the patient is very comfortable appearing at this time and will be given Percocet Zofran and Naprosyn and told to followup with [facility] Reviewed   URINE RT REFLEX TO CULTURE - Abnormal; Notable for the following:     Blood, Urine SMALL (*)     All other components within  normal limits   CBC WITH AUTO DIFFERENTIAL - Abnormal; Notable for the following:     WBC 16.5 (*)     RBC 3.94 (*)     Hemoglobin 11.3 (*)     Hematocrit 34.4 (*)     Neutrophils Absolute 11.9 (*)     All other components within normal limits   MICROSCOPIC URINALYSIS - Abnormal; Notable for the following:     Bacteria, UA Rare (*)     All other components within normal limits   POCT VENOUS - Abnormal; Notable for the following:     CO2 20 (*)     POC Glucose 139 (*)     All other components within normal limits   PREGNANCY, URINE   HEPATIC FUNCTION PANEL   AMYLASE   POCT CHEM BASIC W ICA       Radiology  CLINICAL HISTORY: Pain    TECHNIQUE: CT abdomen and pelvis with IV contrast.    COMPARISON: None    FINDINGS:    Lung bases show no significant abnormality. No free intraperitoneal gas seen.  Liver shows no significant abnormality.  Normal biliary tree.  Spleen shows no significant abnormality.  Adrenal glands show no significant abnormality.  Kidneys show no significant abnormality.  Pancreas shows no significant abnormality.  Abdominal aorta is non-aneurysmal.  No evidence of dissection.    Normal-appearing appendix.  No right lower quadrant inflammatory changes.  No bowel obstruction identified.  4.3 x  3.8 cm right ovarian cyst.  Moderate free pelvic fluid.  Grossly normal left ovary.  Mild mesenteric edema in the left side of the abdomen, with questionable thickening of a few small bowel loops. As he is worried    EKG Interpretation.        Royetta Crochet, MD  10/29/11 9147    Royetta Crochet, MD  10/29/11 8295

## 2012-05-07 ENCOUNTER — Encounter (HOSPITAL_BASED_OUTPATIENT_CLINIC_OR_DEPARTMENT_OTHER): Payer: Self-pay | Admitting: *Deleted

## 2012-05-07 ENCOUNTER — Emergency Department (HOSPITAL_BASED_OUTPATIENT_CLINIC_OR_DEPARTMENT_OTHER): Payer: Medicare Other

## 2012-05-07 ENCOUNTER — Emergency Department (HOSPITAL_BASED_OUTPATIENT_CLINIC_OR_DEPARTMENT_OTHER)
Admission: EM | Admit: 2012-05-07 | Discharge: 2012-05-07 | Disposition: A | Discharge status: Home / Self Care | Payer: Medicare Other | Attending: Emergency Medicine | Admitting: Emergency Medicine

## 2012-05-07 DIAGNOSIS — R059 Cough, unspecified: Secondary | ICD-10-CM | POA: Insufficient documentation

## 2012-05-07 DIAGNOSIS — Z862 Personal history of diseases of the blood and blood-forming organs and certain disorders involving the immune mechanism: Secondary | ICD-10-CM | POA: Insufficient documentation

## 2012-05-07 DIAGNOSIS — Z8739 Personal history of other diseases of the musculoskeletal system and connective tissue: Secondary | ICD-10-CM | POA: Insufficient documentation

## 2012-05-07 DIAGNOSIS — Z79899 Other long term (current) drug therapy: Secondary | ICD-10-CM | POA: Insufficient documentation

## 2012-05-07 DIAGNOSIS — R05 Cough: Secondary | ICD-10-CM | POA: Insufficient documentation

## 2012-05-07 DIAGNOSIS — M549 Dorsalgia, unspecified: Secondary | ICD-10-CM

## 2012-05-07 DIAGNOSIS — M546 Pain in thoracic spine: Secondary | ICD-10-CM | POA: Insufficient documentation

## 2012-05-07 DIAGNOSIS — I2789 Other specified pulmonary heart diseases: Secondary | ICD-10-CM | POA: Insufficient documentation

## 2012-05-07 HISTORY — DX: Other overlap syndromes: M35.1

## 2012-05-07 HISTORY — DX: Pulmonary hypertension, unspecified: I27.20

## 2012-05-07 MED ORDER — DEXAMETHASONE SODIUM PHOSPHATE 10 MG/ML IJ SOLN
10.0000 mg | Freq: Once | INTRAMUSCULAR | Status: AC
  Administered 2012-05-07: 10 mg via INTRAMUSCULAR
  Filled 2012-05-07: qty 1

## 2012-05-07 NOTE — Discharge Instructions (Signed)

## 2012-05-07 NOTE — ED Notes (Signed)
Pt c/o left rib cage pain w/o injury x 2 days

## 2012-05-07 NOTE — ED Provider Notes (Signed)
History     CSN: [id number]  Arrival date & time 05/07/12  1547   First MD Initiated Contact with Patient 05/07/12 1548      Chief Complaint  Patient presents with  . rib cage pain     (Consider location/radiation/quality/duration/timing/severity/associated sxs/prior treatment) HPI Comments: Pt states that  A couple of days ago she started with left sided back pain that she figured was a RA flare, but then it move to the left lower ribs:pt states that it feels like a flare of her RA but her pulmonologist told her to come in a have an x-ray because of her pulmonary htn:pt denies fever but state that she has had a cough the last couple of days:pt states that she has a flare with the cold weather  The history is provided by the patient. No language interpreter was used.    Past Medical History  Diagnosis Date  . Arthritis, rheumatoid   . Polymyositis   . Anemia   . Mixed connective tissue disease   . Pulmonary hypertension     History reviewed. No pertinent past surgical history.  History reviewed. No pertinent family history.  History  Substance Use Topics  . Smoking status: Never Smoker   . Smokeless tobacco: Not on file  . Alcohol Use: No    OB History    Grav Para Term Preterm Abortions TAB SAB Ect Mult Living                  Review of Systems  Constitutional: Negative.   Respiratory: Negative.   Cardiovascular: Negative.     Allergies  Tramadol  Home Medications   Current Outpatient Rx  Name  Route  Sig  Dispense  Refill  . DAPSONE 25 MG PO TABS   Oral   Take 25 mg by mouth daily.         [name] MYCOPHENOLATE SODIUM 360 MG PO TBEC   Oral   Take 500 mg by mouth 2 (two) times daily.         [name] OMEPRAZOLE 20 MG PO CPDR   Oral   Take 1 capsule (20 mg total) by mouth daily.   30 capsule   0   . PREDNISONE 10 MG PO TABS   Oral   Take 15 mg by mouth daily.           [name] RANITIDINE HCL 150 MG PO CAPS   Oral   Take 1 capsule (150 mg total) by  mouth daily.   30 capsule   0     BP 106/76  Temp 97.6 F (36.4 C) (Oral)  Resp 16  Ht 5\' 7"  (1.702 m)  Wt 133 lb (60.328 kg)  BMI 20.83 kg/m2  LMP 04/25/2012  Physical Exam  Nursing note and vitals reviewed. Constitutional: She is oriented to person, place, and time. She appears well-developed and well-nourished.  HENT:  Head: Normocephalic and atraumatic.  Eyes: Conjunctivae normal are normal.  Cardiovascular: Normal rate and regular rhythm.   Pulmonary/Chest: Effort normal and breath sounds normal.       Non tender to palpation  Abdominal: Soft. Bowel sounds are normal. There is no tenderness.  Musculoskeletal:       thoracic paraspinal tenderness  Neurological: She is alert and oriented to person, place, and time.  Skin: Skin is warm and dry.  Psychiatric: She has a normal mood and affect.    ED Course  Procedures (including critical care time)  Labs Reviewed -  No data to display Dg Chest 2 View  05/07/2012  *RADIOLOGY REPORT*  Clinical Data: Pain, cough.  CHEST - 2 VIEW  Comparison: 05/05/2011  Findings: Diffuse increase in interstitial markings again noted throughout the lungs, similar to prior study.  Bibasilar opacities are again noted, unchanged.  No effusions.  Heart is normal size. No acute bony abnormality.  No pneumothorax.  IMPRESSION: Stable diffuse interstitial prominence and bibasilar atelectasis or scarring.  No real change.   Original Report Authenticated By: Charlett Nose, M.D.      1. Back pain       MDM  Pt is requesting to have a steroid shot:think reasonable:no sign of infection noted        Teressa Lower, NP 05/07/12 1720

## 2012-05-07 NOTE — ED Notes (Signed)
NP at bedside.

## 2012-05-07 NOTE — ED Provider Notes (Signed)
Medical screening examination/treatment/procedure(s) were performed by non-physician practitioner and as supervising physician I was immediately available for consultation/collaboration.   Raziyah Vanvleck, MD 05/07/12 2332 

## 2012-12-22 ENCOUNTER — Inpatient Hospital Stay: Admit: 2012-12-22 | Discharge: 2012-12-22

## 2012-12-22 LAB — POCT VENOUS
CO2: 19 mEq/L — ABNORMAL LOW (ref 21–32)
Calcium, Ionized: 1.16 mmol/L (ref 1.12–1.32)
GFR African American: >60 (ref >60)
GFR Non-African American: >60 (ref >60)
POC BUN: 8 mg/dL (ref 7–18)
POC Chloride: 105 mEq/L (ref 99–110)
POC Creatinine: 0.9 mg/dL (ref 0.6–1.1)
POC Glucose: 214 mg/dl — ABNORMAL HIGH (ref 70–99)
POC Potassium: 3.6 mEq/L (ref 3.5–5.1)
POC Sodium: 139 mEq/L (ref 136–145)

## 2012-12-22 MED ORDER — AMOXICILLIN 500 MG PO CAPS
500 | ORAL_CAPSULE | Freq: Two times a day (BID) | ORAL | 0.00 refills | 7.00000 days | Status: DC
Start: 2012-12-22 — End: 2012-12-22

## 2012-12-22 MED ORDER — METHOCARBAMOL 500 MG PO TABS
500 | ORAL_TABLET | Freq: Four times a day (QID) | ORAL | 0.00 refills | 15.00000 days | Status: AC | PRN
Start: 2012-12-22 — End: 2013-01-01

## 2012-12-22 MED ORDER — IBUPROFEN 800 MG PO TABS
800 | ORAL_TABLET | Freq: Four times a day (QID) | ORAL | 0.00 refills | 20.00000 days | Status: DC | PRN
Start: 2012-12-22 — End: 2012-12-22

## 2012-12-22 MED ORDER — NAPROXEN 500 MG PO TABS
500 | ORAL_TABLET | Freq: Two times a day (BID) | ORAL | 0.00 refills | 30.00000 days | Status: DC
Start: 2012-12-22 — End: 2014-01-15

## 2012-12-22 NOTE — ED Notes (Signed)
 Reviewed written discharge instructions with patient, she denied questions and verbalized understanding.  Patient ambulated to discharge office slowly, without difficulty.    Haeven Nickle L Brinda Focht, RN  12/22/12 1655

## 2012-12-22 NOTE — Discharge Instructions (Signed)
Back Strain: After Your Visit  Your Care Instructions  Back strain happens when you overstretch, or pull, a muscle in your back. You may hurt your back in an accident or when you exercise or lift something.  Most back pain will get better with rest and time. You can take care of yourself at home to help your back heal.  Follow-up care is a key part of your treatment and safety. Be sure to make and go to all appointments, and call your doctor if you are having problems. It's also a good idea to know your test results and keep a list of the medicines you take.  How can you care for yourself at home?   Try to stay as active as you can, but stop or reduce any activity that causes pain.   Put ice or a cold pack on the sore muscle for 10 to 20 minutes at a time to stop swelling. Try this every 1 to 2 hours for 3 days (when you are awake) or until the swelling goes down. Put a thin cloth between the ice pack and your skin.   After 2 or 3 days, apply a heating pad on low or a warm cloth to your back. Some doctors suggest that you go back and forth between hot and cold treatments.   Take pain medicines exactly as directed.   If the doctor gave you a prescription medicine for pain, take it as prescribed.   If you are not taking a prescription pain medicine, ask your doctor if you can take an over-the-counter medicine.   Try sleeping on your side with a pillow between your legs. Or put a pillow under your knees when you lie on your back. These measures can ease pain in your lower back.   Return to your usual level of activity slowly.  When should you call for help?  Call 911 anytime you think you may need emergency care. For example, call if:   You lose bladder or bowel control.   You suddenly cannot walk or stand.   You have sudden numbness or weakness in both legs.  Call your doctor now or seek immediate medical care if:   Your pain is worse.   You have new pain, numbness, tingling, or weakness, especially  in the buttocks, genital or rectal area, legs, or feet.   You have symptoms of a urinary infection. For example:   You have blood or pus in your urine.   You have pain in your back just below your rib cage. This is called flank pain.   You have a fever, chills, or body aches.   It hurts to urinate.   You have groin or belly pain.  Watch closely for changes in your health, and be sure to contact your doctor if:   Your pain and swelling do not start to get better after 2 days of home treatment.   Where can you learn more?   Go to [email or web address] and sign in to your MyChart account. Enter [phone] in the [facility] box to learn more about "Back Strain: After Your Visit."    If you do not have an account, please click on the "Sign Up Now" link.      2006-2014 [employer]. Care instructions adapted under license by [facility]. This care instruction is for use with your licensed healthcare professional. If you have questions about a medical condition or this instruction, always ask your healthcare professional.  [employer] disclaims any warranty or liability for your use of this information.  Content Version: 10.1.311062; Current as of: June 04, 2011     I did check your kidney function showed no abnormalities.  Creatinine 0.9.  Blood sugar 214.  Recommend that you follow-up with your physician for continued management of your diabetes and hypothyroidism.  Today we ask that you discontinue Flexeril as it causes sedation.  I will try you on Robaxin as a muscle relaxer and provide you naproxen as an anti-inflammatory.  Apply heat every 6 hours which may help relax the spasm.  I cannot use prednisone which will increase her blood sugar.

## 2012-12-22 NOTE — ED Provider Notes (Signed)
**SEEN BY MID-LEVEL ONLYSo [facility] ED  eMERGENCY dEPARTMENT eNCOUnter      Pt Name: Shelly Richard  MRN: [id number]  Birthdate Aug 27, 1985  Date of evaluation: 12/22/2012  Provider: Rick Duff, PA      Chief Complaint:    Chief Complaint   Patient presents with   ??? Back Pain     PT C/O LOWER BACK PAIN FOR ONE WK. NO INJ. DENIES TROUBLE URINATING       Nursing Notes, Past Medical Hx, Past Surgical Hx, Social Hx, Allergies, and Family Hx were all reviewed and agreed with or any disagreements were addressed in the HPI.    HPI:  (Location, Duration, Timing, Severity, Quality, Assoc Sx, Context, Modifying factors)  This is a  27 y.o. female who presents for evaluation of back pain.  The patient states 1 week ago she began to experience pain in the low back region with a right tendency and down the right leg terminating at the knee.  She has no previous history of similar occurrence.  She works at [employer] as a Conservation officer, nature.  No bowel or bladder dysfunction.  She was seen by PCP who prescribed Flexeril.  This causes sedation and she cannot take it during the day.  She is also experiencing spasms in her back.  There is no clear history of trauma.  Denies any recent fevers or chills.  The patient does have history of hyperlipidemia, diabetes and hypothyroidism.  She tells me that she is on lisinopril for her kidneys.  She is unaware if she has any diabetic nephropathy.  The nursing notes are reviewed and I agree.    Past Medical/Surgical History:      Diagnosis Date   ??? Asthma    ??? Diabetes mellitus (HCC)          Procedure Laterality Date   ??? Cyst removal         Medications:  Discharge Medication List as of 12/22/2012  4:40 PM      CONTINUE these medications which have NOT CHANGED    Details   metformin (GLUCOPHAGE) 500 MG tablet Take 1,000 mg by mouth 2 times daily (with meals).      Levothyroxine Sodium (SYNTHROID PO) Take 1 tablet by mouth daily. Unknown dose      aspirin 81 MG tablet Take 81 mg by mouth  daily.      UNKNOWN TO PATIENT Cholesterol medicaion      albuterol (PROVENTIL HFA;VENTOLIN HFA) 108 (90 BASE) MCG/ACT inhaler Inhale 2 puffs into the lungs every 6 hours as needed.        Spacer/Aero-Holding Chambers (AEROCHAMBER) MISC 1 Device, EVERY 6 HOURS Starting 02/03/2011, Until Discontinued, Disp-1 each, R-0, PrintUse with Inhalers      ondansetron (ZOFRAN ODT) 4 MG disintegrating tablet Take 2 tablets by mouth every 8 hours as needed for Nausea for 20 doses., Disp-20 tablet, R-0              Review of Systems:  Review of systems unremarkable unless mentioned in the HPI.    Physical Exam:  Physical Exam   Constitutional: She is oriented to person, place, and time. She appears well-developed and well-nourished.   HENT:   Head: Normocephalic and atraumatic.   Right Ear: External ear normal.   Left Ear: External ear normal.   Eyes: Conjunctivae are normal. Right eye exhibits no discharge. Left eye exhibits no discharge. No scleral icterus.   Neck: Normal range of motion.  Cardiovascular: Normal rate, regular rhythm and normal heart sounds.    Pulmonary/Chest: Effort normal and breath sounds normal.   Abdominal: Soft. Bowel sounds are normal. She exhibits no mass. There is no hepatosplenomegaly. There is no tenderness. There is no CVA tenderness.   Musculoskeletal: Normal range of motion.   The patient does exhibit mild tenderness in the right low back region.  Straight leg raise is negative.  She has intact sensation to the toes.  She has good strength resisted dorsi and plantar flexion.  She has a strong DP pulse.  Reflexes at knee and ankle are equal bilaterally.  Patient has a normal gait.   Neurological: She is alert and oriented to person, place, and time. She has normal reflexes. No cranial nerve deficit.   Skin: Skin is warm and dry.   Psychiatric: She has a normal mood and affect. Her behavior is normal. Judgment and thought content normal.   Nursing note and vitals reviewed.        MEDICAL DECISION  MAKING    Vitals:    Filed Vitals:    12/22/12 1524   BP: 134/87   Pulse: 109   Temp: 98.1 ??F (36.7 ??C)   TempSrc: Oral   Resp: 20   Height: 5\' 4"  (1.626 m)   Weight: 247 lb (112.038 kg)   SpO2: 97%       LABS:   Labs Reviewed   POCT VENOUS - Abnormal; Notable for the following:     CO2 19 (*)     POC Glucose 214 (*)     All other components within normal limits   POCT CHEM BASIC W ICA        Remainder of labs reviewed and were negative at this time or not returned at the time of this note.    RADIOLOGY:   Non-plain film images such as CT, Ultrasound and MRI are read by the radiologist. Burtis Junes, PA have directly visualized the radiologic plain film image(s) with the below findings:    N/A    Interpretation per the Radiologist below, if available at the time of this note:          No results found.     MEDICAL DECISION MAKING / ED COURSE:      PROCEDURES: None    Patient was given:  Medications - No data to display    The patient has a lumbar sacral strain with a mild right tendency.  The patient was given naproxen and Robaxin.  I did check a BMP which showed creatinine of 0.9 BUN 8.  She does have normal renal function.  Her blood sugar 214.  Advised her to take her medication as directed.  I did advise her to schedule follow-up appointment for her back and her general health care.  She is in agreement.  Advised heat/ice alternating therapy.  Advised crutches and rest.  She will continue working at [employer] without work restrictions.    The patient tolerated their visit well.  They were seen independently with physician available for consultation as needed.  The patient and / or the family were informed of the results of any tests, a time was given to answer questions, a plan was proposed and they agreed with plan.      CLINICAL IMPRESSION:  1. Lumbar strain, initial encounter        DISPOSITION Decision to Discharge    PATIENT REFERRED TO:  Synetta Fail  [address]  [address]  [place]  [address]  [phone]    In 3 days        DISCHARGE MEDICATIONS:  Discharge Medication List as of 12/22/2012  4:40 PM      START taking these medications    Details   methocarbamol (ROBAXIN) 500 MG tablet Take 1 tablet by mouth every 6 hours as needed (Spasm) for up to 10 days., Disp-40 tablet, R-0             (Please note the MDM and HPI sections of this note were completed with a voice recognition program.  Efforts were made to edit the dictations but occasionally words are mis-transcribed.)    Rick Duff, PA        Rick Duff, [place]  12/22/12 [phone]

## 2013-03-22 ENCOUNTER — Emergency Department (HOSPITAL_BASED_OUTPATIENT_CLINIC_OR_DEPARTMENT_OTHER)
Admission: EM | Admit: 2013-03-22 | Discharge: 2013-03-22 | Disposition: A | Discharge status: Home / Self Care | Payer: Medicare Other | Attending: Emergency Medicine | Admitting: Emergency Medicine

## 2013-03-22 ENCOUNTER — Encounter (HOSPITAL_BASED_OUTPATIENT_CLINIC_OR_DEPARTMENT_OTHER): Payer: Self-pay | Admitting: Emergency Medicine

## 2013-03-22 ENCOUNTER — Emergency Department (HOSPITAL_BASED_OUTPATIENT_CLINIC_OR_DEPARTMENT_OTHER): Payer: Medicare Other

## 2013-03-22 DIAGNOSIS — I270 Primary pulmonary hypertension: Secondary | ICD-10-CM | POA: Insufficient documentation

## 2013-03-22 DIAGNOSIS — R05 Cough: Secondary | ICD-10-CM | POA: Insufficient documentation

## 2013-03-22 DIAGNOSIS — IMO0002 Reserved for concepts with insufficient information to code with codable children: Secondary | ICD-10-CM | POA: Insufficient documentation

## 2013-03-22 DIAGNOSIS — Z862 Personal history of diseases of the blood and blood-forming organs and certain disorders involving the immune mechanism: Secondary | ICD-10-CM | POA: Insufficient documentation

## 2013-03-22 DIAGNOSIS — M069 Rheumatoid arthritis, unspecified: Secondary | ICD-10-CM | POA: Insufficient documentation

## 2013-03-22 DIAGNOSIS — Z79899 Other long term (current) drug therapy: Secondary | ICD-10-CM | POA: Insufficient documentation

## 2013-03-22 DIAGNOSIS — R059 Cough, unspecified: Secondary | ICD-10-CM

## 2013-03-22 DIAGNOSIS — Z8719 Personal history of other diseases of the digestive system: Secondary | ICD-10-CM | POA: Insufficient documentation

## 2013-03-22 DIAGNOSIS — J3489 Other specified disorders of nose and nasal sinuses: Secondary | ICD-10-CM | POA: Insufficient documentation

## 2013-03-22 HISTORY — DX: Esophagitis, unspecified without bleeding: K20.90

## 2013-03-22 HISTORY — DX: Esophagitis, unspecified: K20.9

## 2013-03-22 MED ORDER — HYDROCOD POLST-CHLORPHEN POLST 10-8 MG/5ML PO LQCR: 5.0000 mL | Freq: Two times a day (BID) | ORAL | Status: DC | PRN

## 2013-03-22 MED ORDER — AZITHROMYCIN 250 MG PO TABS: 250.0000 mg | ORAL_TABLET | Freq: Every day | ORAL | Status: DC

## 2013-03-22 NOTE — ED Notes (Signed)
Patient states she has a four day history of productive cough with clear secretions.  States yesterday she woke up with laryngitis and muffled hearing in her left ear and drainage in her right eye.  Denies fever.

## 2013-03-22 NOTE — ED Provider Notes (Signed)
Medical screening examination/treatment/procedure(s) were performed by non-physician practitioner and as supervising physician I was immediately available for consultation/collaboration.  EKG Interpretation   None         Candyce Churn, MD 03/22/13 1414

## 2013-03-22 NOTE — ED Provider Notes (Signed)
CSN: [id number]     Arrival date & time 03/22/13  4098 History   First MD Initiated Contact with Patient 03/22/13 [phone]     Chief Complaint  Patient presents with  . URI   (Consider location/radiation/quality/duration/timing/severity/associated sxs/prior Treatment) Patient is a 27 y.o. female presenting with URI. The history is provided by the patient. No language interpreter was used.  URI Presenting symptoms: congestion and cough   Severity:  Mild Duration:  4 days Timing:  Constant Progression:  Unchanged Chronicity:  New Ineffective treatments:  OTC medications Associated symptoms: no headaches, no myalgias and no sinus pain     Past Medical History  Diagnosis Date  . Arthritis, rheumatoid   . Polymyositis   . Anemia   . Mixed connective tissue disease   . Pulmonary hypertension   . Esophagitis    History reviewed. No pertinent past surgical history. No family history on file. History  Substance Use Topics  . Smoking status: Never Smoker   . Smokeless tobacco: Not on file  . Alcohol Use: No   OB History   Grav Para Term Preterm Abortions TAB SAB Ect Mult Living                 Review of Systems  Constitutional: Negative.   HENT: Positive for congestion.   Respiratory: Positive for cough.   Cardiovascular: Negative.   Musculoskeletal: Negative for myalgias.  Neurological: Negative for headaches.    Allergies  Tramadol  Home Medications   Current Outpatient Rx  Name  Route  Sig  Dispense  Refill  . ambrisentan (LETAIRIS) 10 MG tablet   Oral   Take 10 mg by mouth daily.         . dapsone 25 MG tablet   Oral   Take 25 mg by mouth daily.         . hydroxychloroquine (PLAQUENIL) 200 MG tablet   Oral   Take 200 mg by mouth 2 (two) times daily.         [name] lisinopril (PRINIVIL,ZESTRIL) 5 MG tablet   Oral   Take 5 mg by mouth daily.         . mycophenolate (MYFORTIC) 360 MG TBEC   Oral   Take 500 mg by mouth 2 (two) times daily.          [name] omeprazole (PRILOSEC) 20 MG capsule   Oral   Take 1 capsule (20 mg total) by mouth daily.   30 capsule   0   . predniSONE (DELTASONE) 10 MG tablet   Oral   Take 15 mg by mouth daily.           [name] EXPIRED: ranitidine (ZANTAC) 150 MG capsule   Oral   Take 1 capsule (150 mg total) by mouth daily.   30 capsule   0    BP 132/69  Pulse 74  Temp(Src) 98.3 F (36.8 C) (Oral)  Resp 16  Ht 5\' 7"  (1.702 m)  Wt 125 lb (56.7 kg)  BMI 19.57 kg/m2  SpO2 97%  LMP 03/08/2013 Physical Exam  Nursing note and vitals reviewed. Constitutional: She appears well-developed and well-nourished.  HENT:  Head: Normocephalic and atraumatic.  Right Ear: External ear normal.  Left Ear: External ear normal.  Nose: Rhinorrhea present.  Mouth/Throat: Oropharynx is clear and moist.  Eyes: Conjunctivae and EOM are normal. Pupils are equal, round, and reactive to light.  Neck: Normal range of motion. Neck supple.  Cardiovascular: Normal rate and regular rhythm.  Pulmonary/Chest: Effort normal and breath sounds normal.  Musculoskeletal: Normal range of motion.    ED Course  Procedures (including critical care time) Labs Review Labs Reviewed - No data to display Imaging Review Dg Chest 2 View  03/22/2013   CLINICAL DATA:  Cough, sore throat, pulmonary fibrosis  EXAM: CHEST  2 VIEW  COMPARISON:  05/07/2012  FINDINGS: Cardiomediastinal silhouette is stable. Again noted chronic interstitial prominence and peripheral fibrotic changes especially in lower lobe and lung bases. No definite superimposed infiltrate or pulmonary edema.  IMPRESSION: Again noted chronic interstitial prominence and peripheral fibrotic changes especially in lower lobe and lung bases. No definite superimposed infiltrate or pulmonary edema.   Electronically Signed   By: Natasha Mead M.D.   On: 03/22/2013 10:17    EKG Interpretation   None       MDM   1. Cough    Pt treated with antibiotics and cough syrup, based on pt  complex medical history   Teressa Lower, NP 03/22/13 1035

## 2014-01-15 ENCOUNTER — Inpatient Hospital Stay: Admit: 2014-01-15 | Discharge: 2014-01-16 | Attending: Emergency Medicine

## 2014-01-15 ENCOUNTER — Emergency Department: Admit: 2014-01-16 | Primary: Oncology

## 2014-01-15 NOTE — Discharge Instructions (Signed)
Take medicines as prescribed.  Follow-up with your physician or the referral physician as instructed.  Elevate the injured area above the level of the heart as much as possible.  Ice to the affected area for 20 minutes every 3 to 4 hours.  Be sure to place a towel or washcloth between the your skin and the ice bag.  Return if increased pain, numbness, weakness, color/temperature change in extremity or other concerns that your condition is getting worse.       You have been given a medicine which can cause sedation.  Do not have or operate any motor vehicles.  Do not cook, watch small children by yourself or any other safety sensitive home activities.  Do not smoke.  Do not gamble or make any important financial decisions while taking this medicine.  This medicine may cause you to fall asleep when you do not want to.

## 2014-01-15 NOTE — ED Provider Notes (Signed)
HPI Comments: She denies any possibility of pregnancy.  She is driving.      The history is provided by the patient.          [facility]  Emergency Department Encounter    Pt Name: Shelly Richard  MRN: [id number]  [place] 12/03/1985  Date of evaluation: 01/15/2014  Provider: Rolan Bucco, PA    Chief Complaint   Patient presents with   ??? Fall     pt states she tripped over her daughter and injuryed her left upper arm when trying to catch herself. no LOC no recent travel.         Nursing Triage Note, Past Medical Hx, Past Surgical Hx, Social Hx, Allergies, and Family Hx were all reviewed and agreed with or any disagreements were addressed in the HPI.    HPI:  (Location, Duration, Timing, Severity, Quality, Assoc Sx, Context, Modifying factors)  This is a  28 y.o. female presents emergency department complaining of left shoulder and upper arm pain.  She states that she t the pain does not radiate.  She is right-handed.  She denies any other complaints.ripped over her daughter fell and caught herself with her left arm.  She's having pains rates as a 7 on a scale 10 in the left shoulder and left upper arm.  She states that she can reach behind her back but is having difficulty reaching forward.  No tingling or numbness.    Past Medical/Surgical History:      Diagnosis Date   ??? Asthma    ??? Diabetes mellitus ([place])          Procedure Laterality Date   ??? Cyst removal         Medications:  Previous Medications    ALBUTEROL (PROVENTIL HFA;VENTOLIN HFA) 108 (90 BASE) MCG/ACT INHALER    Inhale 2 puffs into the lungs every 6 hours as needed.      ASPIRIN 81 MG TABLET    Take 81 mg by mouth daily.    GLIPIZIDE (GLUCOTROL) 5 MG TABLET    Take 5 mg by mouth daily    LEVOTHYROXINE SODIUM (SYNTHROID PO)    Take 1 tablet by mouth daily. Unknown dose    METFORMIN (GLUCOPHAGE) 500 MG TABLET    Take 1,000 mg by mouth 2 times daily (with meals).    MONTELUKAST SODIUM (SINGULAIR PO)    Take by mouth    NAPROXEN  (NAPROSYN) 500 MG TABLET    Take 1 tablet by mouth 2 times daily for 20 doses.    ONDANSETRON (ZOFRAN ODT) 4 MG DISINTEGRATING TABLET    Take 2 tablets by mouth every 8 hours as needed for Nausea for 20 doses.    SPACER/AERO-HOLDING CHAMBERS (AEROCHAMBER) MISC    1 Device every 6 hours. Use with Inhalers    UNKNOWN TO PATIENT    Cholesterol medicaion    VITAMIN D (CHOLECALCIFEROL) 1000 UNITS CAPS CAPSULE    Take 1,000 Units by mouth daily         Review of Systems  Pertinent Review of systems negative except as noted in the HPI    Physical Exam   Constitutional: She is cooperative.  Non-toxic appearance.   HENT:   Mouth/Throat: Mucous membranes are normal.   Eyes: Conjunctivae are normal.   Neck: Phonation normal.   Neurological: She is alert.   Skin: Skin is warm and dry.   Psychiatric: She has a normal mood and affect.   Nursing note  and vitals reviewed.      Procedures    MDM    MEDICAL DECISION MAKING    Vitals:    Filed Vitals:    01/15/14 1940   BP: 141/83   Pulse: 93   Temp: 98.6 ??F (37 ??C)   TempSrc: Oral   Resp: 16   Height: 5\' 4"  (1.626 m)   Weight: 222 lb (100.699 kg)   SpO2: 98%       LABS: Labs Reviewed - No data to display     Remainder of labs reviewed and were negative at this time or not returned at the time of this note.    Orders:  Orders Placed This Encounter   Procedures   ??? XR Shoulder Left Standard   ??? XR Humerus Left Standard   ??? Nursing communication       EKG:   none    X-RAYS:      XR Humerus Left Standard (Final result) Result time: 01/15/14 20:58:43    Final result by Smitty Knudsen, MD (01/15/14 20:58:43)    Impression:     IMPRESSION:  1. Findings suggest AC separation.       Narrative:     EXAMINATION:  3 VIEWS OF THE LEFT SHOULDER; AP AND LATERAL VIEWS OF THE LEFT HUMERUS    01/15/2014 8:50 pm; 01/15/2014 8:51 pm    COMPARISON:  None.    HISTORY:  injury    Fall on Thursday, acute injury, acute pain    FINDINGS:  Inferior displacement of the acromion relative to the distal clavicle  is  noted measuring approximately 3 mm. The right shoulder is otherwise  unremarkable.    Two views of the left humerus are are within normal limits.               XR Shoulder Left Standard (Final result) Result time: 01/15/14 20:58:43    Final result by Smitty Knudsen, MD (01/15/14 20:58:43)    Impression:     IMPRESSION:  1. Findings suggest AC separation.       Narrative:     EXAMINATION:  3 VIEWS OF THE LEFT SHOULDER; AP AND LATERAL VIEWS OF THE LEFT HUMERUS    01/15/2014 8:50 pm; 01/15/2014 8:51 pm    COMPARISON:  None.    HISTORY:  injury    Fall on Thursday, acute injury, acute pain    FINDINGS:  Inferior displacement of the acromion relative to the distal clavicle is  noted measuring approximately 3 mm. The right shoulder is otherwise  unremarkable.    Two views of the left humerus are are within normal limits.                    MEDICAL DECISION MAKING / ED COURSE:    Patient was given the following medications:  Medications   naproxen (NAPROSYN) tablet 250 mg (250 mg Oral Given 01/15/14 2109)       No evidence of compartment syndrome, infection or neurovascular compromise.  The patient was placed in a sling and follow-up with orthopedics on call.    The patient tolerated their visit well.  Unless otherwise indicated at the beginning of the chart, they were seen and evaluated by the attending physician who agreed with the assessment and plan.  The patient and / or the family were informed of the results of any tests, a time was given to answer questions, a plan was proposed and they agreed with plan.  CLINICAL IMPRESSION:    1. AC separation, left, initial encounter        DISPOSITION Decision to Discharge    PATIENT REFERRED TO:  Vevelyn Francois, MD  [place]  [address] [place] [address]  [phone]    Schedule an appointment as soon as possible for a visit in 3 days        DISCHARGE MEDICATIONS:  Discharge Medication List as of 01/15/2014 10:15 PM      START taking these medications     Details   HYDROcodone-acetaminophen (NORCO) 5-325 MG per tablet Take 1 tablet by mouth every 4 hours as needed for Pain Sedation precautions, Disp-20 tablet, R-0             (Please note the MDM and HPI sections of this note were completed with a voice recognition program.  Efforts were made to edit the dictations but occasionally words are mis-transcribed.Rolan Bucco, PA  01/16/14 [phone]

## 2014-01-15 NOTE — ED Provider Notes (Signed)
 Attending Supervisory Note/Shared Visit   I have personally performed a face to face diagnostic evaluation on this patient. I have reviewed the mid-levels findings and agree with the patient management so far.    Well-appearing 28 year old female sustained a mechanical fall, falling onto her left arm.  Complains of some discomfort to left shoulder.  Neurovascularly she is intact.  Nerve or vascular compromise.  X-rays are and noted with concerns of possible a.c. separation.  The patient will need follow-up with orthopedics.      Shelly DELENA SIMPLER, Shelly Richard  Attending Emergency Physician        Shelly DELENA Simpler, Shelly Richard  01/15/14 2223

## 2014-01-16 MED ORDER — NAPROXEN 250 MG PO TABS
250 MG | Freq: Once | ORAL | Status: AC
Start: 2014-01-16 — End: 2014-01-15
  Administered 2014-01-16: 01:00:00 250 mg via ORAL

## 2014-01-16 MED ORDER — NAPROXEN 500 MG PO TABS
500 | ORAL_TABLET | Freq: Two times a day (BID) | ORAL | 0.00 refills | 30.00000 days | Status: DC
Start: 2014-01-16 — End: 2014-02-03

## 2014-01-16 MED ORDER — HYDROCODONE-ACETAMINOPHEN 5-325 MG PO TABS
5-325 | ORAL_TABLET | ORAL | 0.00 refills | 30.00000 days | Status: DC | PRN
Start: 2014-01-16 — End: 2014-02-03

## 2014-01-16 MED FILL — NAPROXEN 250 MG PO TABS: 250 mg | ORAL | Qty: 1

## 2014-01-18 ENCOUNTER — Ambulatory Visit
Admit: 2014-01-18 | Discharge: 2014-01-18 | Payer: PRIVATE HEALTH INSURANCE | Attending: Orthopaedic Surgery | Primary: Oncology

## 2014-01-18 DIAGNOSIS — S43402A Unspecified sprain of left shoulder joint, initial encounter: Secondary | ICD-10-CM

## 2014-01-18 NOTE — Progress Notes (Signed)
 CHIEF COMPLAINT: Left shoulder/arm pain    DATE OF INJURY: 01/15/14    HISTORY:  Shelly Richard is a 28 y.o. right handed African American female, who presents today for evaluation of a left shoulder injury which occurred on 01/15/14.  The patient reports that this injury occurred when she tripped over her daughter and caught herself with her left arm.  She was first seen and evaluated in [facility] ER, when she was evaluated and asked to f/u with Orthopedics.  The patient denies any other injuries.  She rates pain at a level of 5/10 on an analog scale. Movement makes the pain worse, resting makes the pain better.  No numbness or tingling sensation.      Past Medical History   Diagnosis Date    Asthma     Diabetes mellitus (HCC)        Past Surgical History   Procedure Laterality Date    Cyst removal         Current Outpatient Prescriptions   Medication Sig Dispense Refill    glipiZIDE (GLUCOTROL) 5 MG tablet Take 5 mg by mouth daily      Vitamin D (CHOLECALCIFEROL) 1000 UNITS CAPS capsule Take 1,000 Units by mouth daily      Montelukast  Sodium (SINGULAIR  PO) Take by mouth      naproxen  (NAPROSYN ) 500 MG tablet Take 1 tablet by mouth 2 times daily for 20 doses Take with food 20 tablet 0    HYDROcodone -acetaminophen  (NORCO) 5-325 MG per tablet Take 1 tablet by mouth every 4 hours as needed for Pain Sedation precautions 20 tablet 0    ondansetron  (ZOFRAN  ODT) 4 MG disintegrating tablet Take 2 tablets by mouth every 8 hours as needed for Nausea for 20 doses. 20 tablet 0    metformin  (GLUCOPHAGE ) 500 MG tablet Take 1,000 mg by mouth 2 times daily (with meals).      Levothyroxine  Sodium (SYNTHROID  PO) Take 1 tablet by mouth daily. Unknown dose      aspirin  81 MG tablet Take 81 mg by mouth daily.      UNKNOWN TO PATIENT Cholesterol medicaion      albuterol  (PROVENTIL  HFA;VENTOLIN  HFA) 108 (90 BASE) MCG/ACT inhaler Inhale 2 puffs into the lungs every 6 hours as needed.        Spacer/Aero-Holding Chambers  (AEROCHAMBER) MISC 1 Device every 6 hours. Use with Inhalers 1 each 0     No current facility-administered medications for this visit.       Allergies   Allergen Reactions    Peanuts [Peanut Oil]      THROAT ITCHES       History     Social History    Marital Status: Single     Spouse Name: N/A     Number of Children: N/A    Years of Education: N/A     Occupational History    Not on file.     Social History Main Topics    Smoking status: Never Smoker     Smokeless tobacco: Not on file    Alcohol Use: No    Drug Use: No    Sexual Activity: Not on file     Other Topics Concern    Not on file     Social History Narrative       History reviewed. No pertinent family history.    Review of Systems: The patient's 12 point review of systems was reviewed and documented in the Orthopaedic Surgery intake questionnaire.  PHYSICAL EXAM:  Shelly Richard is a 28 y.o. African American female who presents today in no acute distress, awake, alert, and oriented.    Resp 14  Ht 5' 4 (1.626 m)  Wt 222 lb (100.699 kg)  BMI 38.09 kg/m2  LMP 01/14/2014     On evaluation of her left upper extremity, there is no obvious deformity.  There is no swelling and is no ecchymosis.  She is tender to palpation over the deltoid, biceps and triceps, and otherwise nontender over the remainder of the extremity.    She has no tenderness at her [facility] joint.  Range of motion is decreased secondary to pain over the left shoulder.  The skin overlying the left shoulder is intact without evidence of lesion, laceration or abrasion.  Distal pulses are 2+ and symmetric bilaterally.  Sensation is grossly intact to light touch and symmetric bilaterally. EPL / FPL / Interossei intact.    Left shoulder Xrays:  Xrays dated 01/15/14 were reviewed.  Questionable slight elevation of distal clavicle relative to acromion on one view.      IMPRESSION:    Left shoulder sprain  Left shoulder/arm contusion      PLAN:  Ice / heat to shoulder /  arm.    NSAIDs.    Activity modification.    Follow up prn.

## 2014-01-31 NOTE — Communication Body (Signed)
 CHIEF COMPLAINT: Left shoulder/arm pain    DATE OF INJURY: 01/15/14    HISTORY:  Shelly Richard is a 28 y.o. right handed African American female, who presents today for evaluation of a left shoulder injury which occurred on 01/15/14.  The patient reports that this injury occurred when she tripped over her daughter and caught herself with her left arm.  She was first seen and evaluated in [facility] ER, when she was evaluated and asked to f/u with Orthopedics.  The patient denies any other injuries.  She rates pain at a level of 5/10 on an analog scale. Movement makes the pain worse, resting makes the pain better.  No numbness or tingling sensation.      Past Medical History   Diagnosis Date    Asthma     Diabetes mellitus (HCC)        Past Surgical History   Procedure Laterality Date    Cyst removal         Current Outpatient Prescriptions   Medication Sig Dispense Refill    glipiZIDE (GLUCOTROL) 5 MG tablet Take 5 mg by mouth daily      Vitamin D (CHOLECALCIFEROL) 1000 UNITS CAPS capsule Take 1,000 Units by mouth daily      Montelukast  Sodium (SINGULAIR  PO) Take by mouth      naproxen  (NAPROSYN ) 500 MG tablet Take 1 tablet by mouth 2 times daily for 20 doses Take with food 20 tablet 0    HYDROcodone -acetaminophen  (NORCO) 5-325 MG per tablet Take 1 tablet by mouth every 4 hours as needed for Pain Sedation precautions 20 tablet 0    ondansetron  (ZOFRAN  ODT) 4 MG disintegrating tablet Take 2 tablets by mouth every 8 hours as needed for Nausea for 20 doses. 20 tablet 0    metformin  (GLUCOPHAGE ) 500 MG tablet Take 1,000 mg by mouth 2 times daily (with meals).      Levothyroxine  Sodium (SYNTHROID  PO) Take 1 tablet by mouth daily. Unknown dose      aspirin  81 MG tablet Take 81 mg by mouth daily.      UNKNOWN TO PATIENT Cholesterol medicaion      albuterol  (PROVENTIL  HFA;VENTOLIN  HFA) 108 (90 BASE) MCG/ACT inhaler Inhale 2 puffs into the lungs every 6 hours as needed.        Spacer/Aero-Holding Chambers  (AEROCHAMBER) MISC 1 Device every 6 hours. Use with Inhalers 1 each 0     No current facility-administered medications for this visit.       Allergies   Allergen Reactions    Peanuts [Peanut Oil]      THROAT ITCHES       History     Social History    Marital Status: Single     Spouse Name: N/A     Number of Children: N/A    Years of Education: N/A     Occupational History    Not on file.     Social History Main Topics    Smoking status: Never Smoker     Smokeless tobacco: Not on file    Alcohol Use: No    Drug Use: No    Sexual Activity: Not on file     Other Topics Concern    Not on file     Social History Narrative       History reviewed. No pertinent family history.    Review of Systems: The patient's 12 point review of systems was reviewed and documented in the Orthopaedic Surgery intake questionnaire.  PHYSICAL EXAM:  Ms. Pask is a 28 y.o. African American female who presents today in no acute distress, awake, alert, and oriented.    Resp 14  Ht 5' 4 (1.626 m)  Wt 222 lb (100.699 kg)  BMI 38.09 kg/m2  LMP 01/14/2014     On evaluation of her left upper extremity, there is no obvious deformity.  There is no swelling and is no ecchymosis.  She is tender to palpation over the deltoid, biceps and triceps, and otherwise nontender over the remainder of the extremity.    She has no tenderness at her [facility] joint.  Range of motion is decreased secondary to pain over the left shoulder.  The skin overlying the left shoulder is intact without evidence of lesion, laceration or abrasion.  Distal pulses are 2+ and symmetric bilaterally.  Sensation is grossly intact to light touch and symmetric bilaterally. EPL / FPL / Interossei intact.    Left shoulder Xrays:  Xrays dated 01/15/14 were reviewed.  Questionable slight elevation of distal clavicle relative to acromion on one view.      IMPRESSION:    Left shoulder sprain  Left shoulder/arm contusion      PLAN:  Ice / heat to shoulder /  arm.    NSAIDs.    Activity modification.    Follow up prn.

## 2014-02-03 ENCOUNTER — Emergency Department: Admit: 2014-02-03 | Primary: Oncology

## 2014-02-03 ENCOUNTER — Inpatient Hospital Stay: Admit: 2014-02-03 | Discharge: 2014-02-04 | Attending: Emergency Medicine

## 2014-02-03 LAB — WET PREP, GENITAL
RBC, Wet Prep: NONE SEEN
Trichomonas Prep: NONE SEEN

## 2014-02-03 LAB — CBC WITH AUTO DIFFERENTIAL
Basophils %: 0.8 %
Basophils Absolute: 0.1 10*3/uL (ref 0.0–0.2)
Eosinophils %: 1 %
Eosinophils Absolute: 0.1 10*3/uL (ref 0.0–0.6)
Hematocrit: 28.5 % — ABNORMAL LOW (ref 36.0–48.0)
Hemoglobin: 8.2 g/dL — ABNORMAL LOW (ref 12.0–16.0)
Lymphocytes %: 31.4 %
Lymphocytes Absolute: 4.1 10*3/uL (ref 1.0–5.1)
MCH: 18.6 pg — ABNORMAL LOW (ref 26.0–34.0)
MCHC: 28.7 g/dL — ABNORMAL LOW (ref 31.0–36.0)
MCV: 64.6 fL — ABNORMAL LOW (ref 80.0–100.0)
MPV: 9.1 fL (ref 5.0–10.5)
Monocytes %: 4.5 %
Monocytes Absolute: 0.6 10*3/uL (ref 0.0–1.3)
Neutrophils %: 62.3 %
Neutrophils Absolute: 8.2 10*3/uL — ABNORMAL HIGH (ref 1.7–7.7)
PLATELET SLIDE REVIEW: ADEQUATE
Platelets: 426 10*3/uL (ref 135–450)
RBC: 4.41 M/uL (ref 4.00–5.20)
RDW: 20.4 % — ABNORMAL HIGH (ref 12.4–15.4)
WBC: 13.2 10*3/uL — ABNORMAL HIGH (ref 4.0–11.0)

## 2014-02-03 LAB — URINALYSIS WITH REFLEX TO CULTURE
Bilirubin Urine: NEGATIVE
Glucose, Ur: >=1000 mg/dL — CR
Ketones, Urine: NEGATIVE mg/dL
Nitrite, Urine: NEGATIVE
Specific Gravity, UA: 1.03 (ref 1.005–1.030)
Urobilinogen, Urine: 0.2 E.U./dL (ref <2.0)
pH, UA: 6 (ref 5.0–8.0)

## 2014-02-03 LAB — HEPATIC FUNCTION PANEL
ALT: 10 U/L (ref 10–40)
AST: 18 U/L (ref 15–37)
Albumin: 4.2 g/dL (ref 3.4–5.0)
Alkaline Phosphatase: 131 U/L — ABNORMAL HIGH (ref 40–129)
Bilirubin, Direct: <0.2 mg/dL (ref 0.0–0.3)
Total Bilirubin: 0.4 mg/dL (ref 0.0–1.0)
Total Protein: 8.5 g/dL — ABNORMAL HIGH (ref 6.4–8.2)

## 2014-02-03 LAB — BASIC METABOLIC PANEL
Anion Gap: 17 — ABNORMAL HIGH (ref 3–16)
BUN: 12 mg/dL (ref 7–20)
CO2: 21 mmol/L (ref 21–32)
Calcium: 10.4 mg/dL (ref 8.3–10.6)
Chloride: 100 mmol/L (ref 99–110)
Creatinine: 0.7 mg/dL (ref 0.6–1.1)
GFR African American: >60 (ref >60)
GFR Non-African American: >60 (ref >60)
Glucose: 186 mg/dL — ABNORMAL HIGH (ref 70–99)
Potassium: 4 mmol/L (ref 3.5–5.1)
Sodium: 138 mmol/L (ref 136–145)

## 2014-02-03 LAB — LIPASE: Lipase: 28 U/L (ref 13.0–60.0)

## 2014-02-03 LAB — MICROSCOPIC URINALYSIS
Epithelial Cells, UA: 7 /HPF — ABNORMAL HIGH (ref 0–5)
Hyaline Casts, UA: 1 /HPF (ref 0–8)
RBC, UA: 2 /HPF (ref 0–4)
WBC, UA: 85 /HPF — ABNORMAL HIGH (ref 0–5)

## 2014-02-03 LAB — PREGNANCY, URINE: Pregnancy, Urine: NEGATIVE

## 2014-02-03 MED ORDER — SODIUM CHLORIDE 0.9 % IJ SOLN
0.9 % | Freq: Once | INTRAMUSCULAR | Status: AC
Start: 2014-02-03 — End: 2014-02-03
  Administered 2014-02-03: 10 mL via INTRAVENOUS

## 2014-02-03 MED ORDER — NORMAL SALINE FLUSH 0.9 % IV SOLN
0.9 | INTRAVENOUS | Status: AC
Start: 2014-02-03 — End: 2014-02-03

## 2014-02-03 MED ORDER — IOPAMIDOL 76 % IV SOLN
76 % | Freq: Once | INTRAVENOUS | Status: AC | PRN
Start: 2014-02-03 — End: 2014-02-03
  Administered 2014-02-03: 100 mL via INTRAVENOUS

## 2014-02-03 MED FILL — NORMAL SALINE FLUSH 0.9 % IV SOLN: 0.9 % | INTRAVENOUS | Qty: 20

## 2014-02-03 MED FILL — ISOVUE-370 76 % IV SOLN: 76 % | INTRAVENOUS | Qty: 100

## 2014-02-03 NOTE — ED Provider Notes (Signed)
 HPI       [facility] ED  Emergency Department Encounter    Pt Name: Shelly Richard  MRN: [id number]  Birthdate [birth year]  Date of evaluation: 02/03/2014  Provider: DARICE JONELLE HINT, CNP    Chief Complaint   Patient presents with    Abdominal Pain     pt c/o abd pain, vaginal discharge x 2 days       Nursing Notes, Past Medical Hx, Past Surgical Hx, Social Hx, Allergies, and Family Hx were all reviewed and agreed with or any disagreements were addressed in the HPI.    HPI:  (Location, Duration, Timing, Severity, Quality, Assoc Sx, Context, Modifying factors)  This is a  28 y.o. female who presents to the emergency room with a three-day history of intermittent abdominal pain.  She states that this started suddenly in the RLQ and now feels like spasms across her lower abdomen which come and go and only last for several seconds per episode.  She denies pain currently.  The patient had a normal bowel movement yesterday.  Denies nausea, vomiting, diarrhea or bloody stools.  She denies any urinary symptoms.  Does report a 2 day history of vaginal irritation, itching with white discharge.  She states that she was treated for yeast and bacterial vaginosis last month.  She just saw her gynecologist last week and states that all of her cultures came back negative.  She is sexually active with one female partner.  She denies any prior abdominal surgeries besides an ovarian cyst removal.    Past Medical/Surgical History:      Diagnosis Date    Asthma     Diabetes mellitus (HCC)          Procedure Laterality Date    Cyst removal         Medications:  Discharge Medication List as of 02/03/2014  9:54 PM      CONTINUE these medications which have NOT CHANGED    Details   acetaminophen -codeine (TYLENOL  #3) 300-30 MG per tablet Take 1 tablet by mouth every 4 hours as needed for Pain      glipiZIDE (GLUCOTROL) 5 MG tablet Take 5 mg by mouth daily      Montelukast  Sodium (SINGULAIR  PO) Take 10 mg by mouth daily        metformin  (GLUCOPHAGE ) 500 MG tablet Take 1,000 mg by mouth 2 times daily (with meals).      albuterol  (PROVENTIL  HFA;VENTOLIN  HFA) 108 (90 BASE) MCG/ACT inhaler Inhale 2 puffs into the lungs every 6 hours as needed.        Spacer/Aero-Holding Chambers (AEROCHAMBER) MISC EVERY 6 HOURS Starting 02/03/2011, Until Discontinued, Disp-1 each, R-0, PrintUse with Inhalers      ondansetron  (ZOFRAN  ODT) 4 MG disintegrating tablet Take 2 tablets by mouth every 8 hours as needed for Nausea for 20 doses., Disp-20 tablet, R-0                 Review of Systems   Constitutional: Negative for fever and chills.   Gastrointestinal: Positive for abdominal pain. Negative for nausea, vomiting, diarrhea and constipation.   Genitourinary: Positive for vaginal discharge. Negative for dysuria, urgency, hematuria, flank pain, vaginal bleeding and difficulty urinating.   All other systems reviewed and are negative.      Physical Exam   Constitutional: She is oriented to person, place, and time. She appears well-developed and well-nourished.   HENT:   Head: Normocephalic and atraumatic.   Neck: Normal range of  motion. Neck supple.   Cardiovascular: Normal rate, regular rhythm, normal heart sounds and intact distal pulses.    Pulmonary/Chest: Effort normal and breath sounds normal. No respiratory distress. She has no wheezes.   Abdominal: Soft. Bowel sounds are normal. She exhibits no distension. There is no tenderness.   Genitourinary: Rectum normal. Rectal exam shows no external hemorrhoid, no internal hemorrhoid, no fissure, no mass, no tenderness and anal tone normal. Guaiac negative stool.   There is no stool present on gloved finger during rectal exam   Musculoskeletal: Normal range of motion. She exhibits no edema or tenderness.   Neurological: She is alert and oriented to person, place, and time.   Skin: Skin is warm and dry. No rash noted. She is not diaphoretic. No erythema. No pallor.   Psychiatric: She has a normal mood and  affect. Her behavior is normal. Judgment and thought content normal.   Nursing note and vitals reviewed.    The pelvic exam was performed with [place], RN at bedside  No external rashes or lesions.  Scant white clumpy discharge in posterior vault.  Closed cervix.  No cervical motion tenderness.  No adnexal tenderness or masses appreciate  The patient tolerated without difficulty      Procedures  NA    MDM    MEDICAL DECISION MAKING    Vitals:    Filed Vitals:    02/03/14 1720 02/03/14 1722   BP:  125/87   Pulse:  82   Temp:  99 F (37.2 C)   TempSrc:  Oral   Resp:  20   Height: 5' 4 (1.626 m)    Weight: 218 lb (98.884 kg)    SpO2:  99%       LABS:   Labs Reviewed   WET PREP, GENITAL - Abnormal; Notable for the following:     Yeast, Wet Prep 1+ (*)     Clue Cells, Wet Prep 1+ (*)     All other components within normal limits   URINE RT REFLEX TO CULTURE - Abnormal; Notable for the following:     Clarity, UA CLOUDY (*)     Glucose, Ur >=1000 (*)     Blood, Urine TRACE (*)     Protein, UA TRACE (*)     Leukocyte Esterase, Urine MODERATE (*)     All other components within normal limits    Narrative:     CALL  Ward  SFERF tel. [phone],  Urinalysis results called to and read back by Maudie Grill RN,  02/03/2014 18:18, by CHICL   CBC WITH AUTO DIFFERENTIAL - Abnormal; Notable for the following:     WBC 13.2 (*)     Hemoglobin 8.2 (*)     Hematocrit 28.5 (*)     MCV 64.6 (*)     MCH 18.6 (*)     MCHC 28.7 (*)     RDW 20.4 (*)     Neutrophils Absolute 8.2 (*)     Anisocytosis 2+ (*)     Microcytes 3+ (*)     Polychromasia 1+ (*)     Ovalocytes 3+ (*)     All other components within normal limits   BASIC METABOLIC PANEL - Abnormal; Notable for the following:     Anion Gap 17 (*)     Glucose 186 (*)     All other components within normal limits   HEPATIC FUNCTION PANEL - Abnormal; Notable for the following:     Total Protein  8.5 (*)     Alkaline Phosphatase 131 (*)     All other components within normal limits    MICROSCOPIC URINALYSIS - Abnormal; Notable for the following:     Bacteria, UA 2+ (*)     WBC, UA 85 (*)     Epi Cells 7 (*)     All other components within normal limits    Narrative:     CALL  Ward  [facility] tel. [phone],  Urinalysis results called to and read back by Maudie Grill RN,  02/03/2014 18:18, by CHICL   C. TRACHOMATIS / N. GONORRHOEAE, DNA PROBE   URINE CULTURE   PREGNANCY, URINE   LIPASE        Remainder of labs reviewed and were negative at this time or not returned at the time of this note.    Orders:  Orders Placed This Encounter   Procedures    Wet prep, genital    C. trachomatis / N. gonorrhoeae, DNA probe    Urine Culture    CT ABDOMEN PELVIS W IV CONTRAST Additional Contrast?: None    Pregnancy, Urine    Urine reflex to culture    CBC Auto Differential    Basic Metabolic Panel    Hepatic Function Panel    Lipase    Microscopic Urinalysis    Vaginal exam    Saline lock IV       EKG:NA    RADIOLOGY:   Non-plain film images such as CT, Ultrasound and MRI are read by the radiologist. The attending, Eva JONETTA Crosser, MD, and I, Cornie Mccomber R Bevely Hackbart, CNP, have visualized the radiologic plain film image(s) with the below findings:        Interpretation per the Radiologist below, if available at the time of this note:    CT ABDOMEN PELVIS W IV CONTRAST    Final Result: IMPRESSION:     1. Enlarged fibroid uterus.    2. Small nonspecific right ovarian cysts.  Follow-up pelvic ultrasound in 6    weeks recommended to document resolution.             CT ABDOMEN PELVIS W IV CONTRAST Additional Contrast?: None (Final result) Result time: 02/03/14 20:04:59    Final result by Glendia Debby Axon, MD (02/03/14 20:04:59)    Impression:     IMPRESSION:  1. Enlarged fibroid uterus.  2. Small nonspecific right ovarian cysts. Follow-up pelvic ultrasound in 6  weeks recommended to document resolution.       Narrative:     EXAMINATION:  CT OF THE ABDOMEN AND PELVIS WITH CONTRAST 02/03/2014 7:56  pm    TECHNIQUE:  CT of the abdomen and pelvis was performed with the administration of  intravenous contrast. Multiplanar reformatted images are provided for review.    COMPARISON:  10/29/2011.    HISTORY:  abd pain, leukocytosis    FINDINGS:  Lower Chest: The lung bases are clear. There is no pleural effusion.    Organs: The liver, pancreas, spleen, kidneys and adrenals are unremarkable.    GI/Bowel: There is no bowel wall thickening, distention or obstruction. The  appendix is normal in appearance.    Pelvis: The bladder is decompressed. The uterus is heterogenous in  attenuation with several intramural and submucosal fibroids noted. There are  2 cysts arising from the right ovary, measuring 3.1 and 2.6 cm respectively.    Peritoneum/Retroperitoneum: There is no free air, free fluid or  intraperitoneal inflammatory change. There is no adenopathy.  Bones/Soft Tissues: Negative.            Xr Humerus Left Standard    01/15/2014   EXAMINATION: 3 VIEWS OF THE LEFT SHOULDER; AP AND LATERAL VIEWS OF THE LEFT HUMERUS  01/15/2014 8:50 pm; 01/15/2014 8:51 pm  COMPARISON: None.  HISTORY: injury  Fall on Thursday, acute injury, acute pain  FINDINGS: Inferior displacement of the acromion relative to the distal clavicle is noted measuring approximately 3 mm.  The right shoulder is otherwise unremarkable.  Two views of the left humerus are are within normal limits.     01/15/2014   IMPRESSION: 1. Findings suggest AC separation.     Xr Shoulder Left Standard    01/15/2014   EXAMINATION: 3 VIEWS OF THE LEFT SHOULDER; AP AND LATERAL VIEWS OF THE LEFT HUMERUS  01/15/2014 8:50 pm; 01/15/2014 8:51 pm  COMPARISON: None.  HISTORY: injury  Fall on Thursday, acute injury, acute pain  FINDINGS: Inferior displacement of the acromion relative to the distal clavicle is noted measuring approximately 3 mm.  The right shoulder is otherwise unremarkable.  Two views of the left humerus are are within normal limits.     01/15/2014   IMPRESSION: 1.  Findings suggest AC separation.            CRITICAL CARE:  N/A    CONSULTATIONS: N/A    MEDICAL DECISION MAKING / ED COURSE:    Patient was given the following medications:  Medications   sodium chloride  (PF) 0.9 % injection 10 mL (10 mLs Intravenous Given 02/03/14 1951)   iopamidol  (ISOVUE -370) 76 % injection 100 mL (100 mLs Intravenous Given 02/03/14 1951)     The patient is not febrile, tachycardic or toxic in appearance.  There is no abdominal tenderness on exam.  She is resting in bed comfortably.  She was offered medication for pain but declines.  He is positive for white blood cell count of 13.2.  Hemoglobin and hematocrit are also low at 8.2 and 28.5.  The patient denies history of anemia or recent black or bloody stools.  There is no stool in the rectal vault to guaiac.  No obvious source of bleeding.  BMP without evidence for renal failure or significant electrolyte abnormality.  LFTs and lipase normal.  She is not pregnant.  Urinalysis is positive for 85 white blood cells and 7 epithelial cells.  Urine culture was sent and is pending.  The patient denies urinary symptoms and will not be treated for UTI this time.  Wet prep was negative for Trichomonas, positive for yeast and clue cells.  DNA probe was sent and is pending.  The patient will be treated with Diflucan  and Flagyl  to cover the yeast and special vaginosis.  There is no evidence for PID on exam and she was not treated prophylactically for gonorrhea or Chlamydia.  She was sent for CT scan of the abdomen and pelvis.  This shows no evidence for appendicitis, diverticulitis, pancreatitis or acute surgical abdomen.  She was noted to have an enlarged fibroid uterus as well as small nonspecific right ovarian cysts.  No evidence to suggest ovarian torsion .  The patient may have had a ruptured ovarian cyst    The patient is stable for discharge and outpatient management.  She was advised to follow with primary care and gynecology.  Advised that she would  need her blood counts rechecked in the next 1-2 weeks.  Advised to return here for severe pain, high fever, persistent vomiting, new or  worse symptoms or other concerns.      The patient tolerated their visit well.  They were seen and evaluated by the attending physician who agreed with the assessment and plan.  The patient and / or the family were informed of the results of any tests, a time was given to answer questions, a plan was proposed and they agreed with plan.      CLINICAL IMPRESSION:    1. Bacterial vaginosis    2. Vaginal candidiasis    3. Lower abdominal pain    4. Uterine leiomyoma, unspecified location    5. Cyst of right ovary    6. Anemia, unspecified anemia type        DISPOSITION Decision to Discharge    PATIENT REFERRED TO:  Lisbeth M Lazaron  [address]  [address]  [place] [address]  [phone]            DISCHARGE MEDICATIONS:  Discharge Medication List as of 02/03/2014  9:54 PM      START taking these medications    Details   fluconazole  (DIFLUCAN ) 150 MG tablet Take 1 tablet by mouth once for 1 dose Take one and repeat in 1 week, Disp-2 tablet, R-0      metroNIDAZOLE  (FLAGYL ) 500 MG tablet Take 1 tablet by mouth 2 times daily for 7 days, Disp-14 tablet, R-0             DISCONTINUED MEDICATIONS:  Discharge Medication List as of 02/03/2014  9:54 PM      STOP taking these medications       Vitamin D (CHOLECALCIFEROL) 1000 UNITS CAPS capsule Comments:   Reason for Stopping:         HYDROcodone -acetaminophen  (NORCO) 5-325 MG per tablet Comments:   Reason for Stopping:         Levothyroxine  Sodium (SYNTHROID  PO) Comments:   Reason for Stopping:         aspirin  81 MG tablet Comments:   Reason for Stopping:         UNKNOWN TO PATIENT Comments:   Reason for Stopping:                 (Please note the MDM and HPI sections of this note were completed with a voice recognition program.  Efforts were made to edit the dictations but occasionally words are mis-transcribed.)    Nikyla Navedo R Boden Stucky,  CNP          Garland Smouse R Katlynne Mckercher, CNP  02/04/14 0017

## 2014-02-04 MED ORDER — FLUCONAZOLE 150 MG PO TABS
150 | ORAL_TABLET | Freq: Once | ORAL | 0.00 refills | 5.00000 days | Status: AC
Start: 2014-02-04 — End: 2014-02-03

## 2014-02-04 MED ORDER — NAPROXEN 500 MG PO TABS
500 | ORAL_TABLET | Freq: Two times a day (BID) | ORAL | 0.00 refills | 30.00000 days | Status: DC
Start: 2014-02-04 — End: 2015-11-14

## 2014-02-04 MED ORDER — METRONIDAZOLE 500 MG PO TABS
500 | ORAL_TABLET | Freq: Two times a day (BID) | ORAL | Status: AC
Start: 2014-02-04 — End: 2014-02-10

## 2014-02-26 ENCOUNTER — Encounter (HOSPITAL_BASED_OUTPATIENT_CLINIC_OR_DEPARTMENT_OTHER): Payer: Self-pay

## 2014-02-26 ENCOUNTER — Emergency Department (HOSPITAL_BASED_OUTPATIENT_CLINIC_OR_DEPARTMENT_OTHER): Payer: Medicare Other

## 2014-02-26 ENCOUNTER — Emergency Department (HOSPITAL_BASED_OUTPATIENT_CLINIC_OR_DEPARTMENT_OTHER)
Admission: EM | Admit: 2014-02-26 | Discharge: 2014-02-26 | Disposition: A | Discharge status: Home / Self Care | Payer: Medicare Other | Attending: Emergency Medicine | Admitting: Emergency Medicine

## 2014-02-26 DIAGNOSIS — M3320 Polymyositis, organ involvement unspecified: Secondary | ICD-10-CM | POA: Insufficient documentation

## 2014-02-26 DIAGNOSIS — J069 Acute upper respiratory infection, unspecified: Secondary | ICD-10-CM | POA: Insufficient documentation

## 2014-02-26 DIAGNOSIS — R05 Cough: Secondary | ICD-10-CM

## 2014-02-26 DIAGNOSIS — Z79899 Other long term (current) drug therapy: Secondary | ICD-10-CM | POA: Diagnosis not present

## 2014-02-26 DIAGNOSIS — Z7952 Long term (current) use of systemic steroids: Secondary | ICD-10-CM | POA: Diagnosis not present

## 2014-02-26 DIAGNOSIS — Z8679 Personal history of other diseases of the circulatory system: Secondary | ICD-10-CM | POA: Diagnosis not present

## 2014-02-26 DIAGNOSIS — R059 Cough, unspecified: Secondary | ICD-10-CM

## 2014-02-26 DIAGNOSIS — M069 Rheumatoid arthritis, unspecified: Secondary | ICD-10-CM | POA: Diagnosis not present

## 2014-02-26 DIAGNOSIS — Z862 Personal history of diseases of the blood and blood-forming organs and certain disorders involving the immune mechanism: Secondary | ICD-10-CM | POA: Insufficient documentation

## 2014-02-26 MED ORDER — AZITHROMYCIN 250 MG PO TABS: 250.0000 mg | ORAL_TABLET | Freq: Every day | ORAL | Status: DC

## 2014-02-26 MED ORDER — FLUCONAZOLE 150 MG PO TABS: 150.0000 mg | ORAL_TABLET | Freq: Once | ORAL | Status: DC

## 2014-02-26 MED ORDER — BENZONATATE 100 MG PO CAPS: 100.0000 mg | ORAL_CAPSULE | Freq: Three times a day (TID) | ORAL | Status: DC | PRN

## 2014-02-26 NOTE — ED Provider Notes (Signed)
TIME SEEN: 9:53 AM  CHIEF COMPLAINT: cough  HPI: Pt is a 28 y.o. F with history of rheumatoid arthritis and polymyositis on Plaquenil, Myfortic and prednisone who presents emergency department with one week of cough. She reports that last week she had a cold where she had nasal congestion and dry cough and states it is now moved into her chest and she is now having a productive cough that is worse at night but she still feels this is improving. She called her PCP office today and the instructor she come to the emergency department because she has a history of pulmonary fibrosis, pulmonary hypertension. She denies chest pain or shortness of breath. No calf swelling or tenderness. Denies any fevers or chills. No vomiting or diarrhea. She has had a pneumonia vaccination this year but no influenza vaccination.  ROS: See HPI Constitutional: no fever  Eyes: no drainage  ENT: no runny nose   Cardiovascular:  no chest pain  Resp: no SOB  GI: no vomiting GU: no dysuria Integumentary: no rash  Allergy: no hives  Musculoskeletal: no leg swelling  Neurological: no slurred speech ROS otherwise negative  PAST MEDICAL HISTORY/PAST SURGICAL HISTORY:  Past Medical History  Diagnosis Date  . Arthritis, rheumatoid   . Polymyositis   . Anemia   . Mixed connective tissue disease   . Pulmonary hypertension   . Esophagitis     MEDICATIONS:  Prior to Admission medications   Medication Sig Start Date End Date Taking? Authorizing Provider  ibuprofen (ADVIL,MOTRIN) 800 MG tablet Take 800 mg by mouth 2 (two) times daily.   Yes Historical Provider, MD  omeprazole (PRILOSEC) 20 MG capsule Take 1 capsule (20 mg total) by mouth daily. 12/18/10 02/26/14 Yes Glynn Octave, MD  ambrisentan (LETAIRIS) 10 MG tablet Take 10 mg by mouth daily.    Historical Provider, MD  dapsone 25 MG tablet Take 25 mg by mouth daily.    Historical Provider, MD  hydroxychloroquine (PLAQUENIL) 200 MG tablet Take 200 mg by mouth 2  (two) times daily.    Historical Provider, MD  lisinopril (PRINIVIL,ZESTRIL) 5 MG tablet Take 5 mg by mouth daily.    Historical Provider, MD  mycophenolate (MYFORTIC) 360 MG TBEC Take 500 mg by mouth 2 (two) times daily.    Historical Provider, MD  predniSONE (DELTASONE) 10 MG tablet Take 15 mg by mouth daily.      Historical Provider, MD    ALLERGIES:  Allergies  Allergen Reactions  . Tramadol Shortness Of Breath    SOCIAL HISTORY:  History  Substance Use Topics  . Smoking status: Never Smoker   . Smokeless tobacco: Not on file  . Alcohol Use: No    FAMILY HISTORY: No family history on file.  EXAM: BP 121/85 mmHg  Pulse 99  Temp(Src) 98.3 F (36.8 C) (Oral)  Resp 20  Wt 118 lb (53.524 kg)  SpO2 100% CONSTITUTIONAL: Alert and oriented and responds appropriately to questions. Well-appearing; well-nourished HEAD: Normocephalic EYES: Conjunctivae clear, PERRL ENT: normal nose; no rhinorrhea; moist mucous membranes; pharynx without lesions noted NECK: Supple, no meningismus, no LAD  CARD: RRR; S1 and S2 appreciated; no murmurs, no clicks, no rubs, no gallops RESP: Normal chest excursion without splinting or tachypnea; breath sounds clear and equal bilaterally; no wheezes, no rhonchi, no rales, no hypoxia or respiratory distress ABD/GI: Normal bowel sounds; non-distended; soft, non-tender, no rebound, no guarding BACK:  The back appears normal and is non-tender to palpation, there is no CVA tenderness  EXT: patient is chronically flexed at her MCPs and bilateral hands consistent with rheumatoid arthritis, Normal ROM in all joints; non-tender to palpation; no edema; normal capillary refill; no cyanosis    SKIN: Normal color for age and race; warm NEURO: Moves all extremities equally PSYCH: The patient's mood and manner are appropriate. Grooming and personal hygiene are appropriate.  MEDICAL DECISION MAKING: Pt here with cough that is productive. She is on immunosuppressants but  is well-appearing, nontoxic and hemodynamically stable. No hypoxia. No chest pain or shortness of breath. We'll obtain chest x-ray to evaluate for edema versus infiltrate.  I'm not concerned for pulmonary embolus and she is not tachycardic, hypoxic and has no complaints of chest pain or shortness of breath.  ED PROGRESS: Pt's chest x-ray shows chronic scarring, appears change compared to prior CXRs.  No obvious infiltrate or edema. Given however patient his immunocompromise, will start her on azithromycin empirically. We'll have her follow-up with her PCP. Discussed strict return precautions. She verbalized understanding and is comfort with plan.     Jeanne Maw Lenzie Montesano, DO 02/26/14 1037

## 2014-02-26 NOTE — Discharge Instructions (Signed)
Upper Respiratory Infection, Adult An upper respiratory infection (URI) is also sometimes known as the common cold. The upper respiratory tract includes the nose, sinuses, throat, trachea, and bronchi. Bronchi are the airways leading to the lungs. Most people improve within 1 week, but symptoms can last up to 2 weeks. A residual cough may last even longer.  CAUSES Many different viruses can infect the tissues lining the upper respiratory tract. The tissues become irritated and inflamed and often become very moist. Mucus production is also common. A cold is contagious. You can easily spread the virus to others by oral contact. This includes kissing, sharing a glass, coughing, or sneezing. Touching your mouth or nose and then touching a surface, which is then touched by another person, can also spread the virus. SYMPTOMS  Symptoms typically develop 1 to 3 days after you come in contact with a cold virus. Symptoms vary from person to person. They may include:  Runny nose.  Sneezing.  Nasal congestion.  Sinus irritation.  Sore throat.  Loss of voice (laryngitis).  Cough.  Fatigue.  Muscle aches.  Loss of appetite.  Headache.  Low-grade fever. DIAGNOSIS  You might diagnose your own cold based on familiar symptoms, since most people get a cold 2 to 3 times a year. Your caregiver can confirm this based on your exam. Most importantly, your caregiver can check that your symptoms are not due to another disease such as strep throat, sinusitis, pneumonia, asthma, or epiglottitis. Blood tests, throat tests, and X-rays are not necessary to diagnose a common cold, but they may sometimes be helpful in excluding other more serious diseases. Your caregiver will decide if any further tests are required. RISKS AND COMPLICATIONS  You may be at risk for a more severe case of the common cold if you smoke cigarettes, have chronic heart disease (such as heart failure) or lung disease (such as asthma), or if  you have a weakened immune system. The very young and very old are also at risk for more serious infections. Bacterial sinusitis, middle ear infections, and bacterial pneumonia can complicate the common cold. The common cold can worsen asthma and chronic obstructive pulmonary disease (COPD). Sometimes, these complications can require emergency medical care and may be life-threatening. PREVENTION  The best way to protect against getting a cold is to practice good hygiene. Avoid oral or hand contact with people with cold symptoms. Wash your hands often if contact occurs. There is no clear evidence that vitamin C, vitamin E, echinacea, or exercise reduces the chance of developing a cold. However, it is always recommended to get plenty of rest and practice good nutrition. TREATMENT  Treatment is directed at relieving symptoms. There is no cure. Antibiotics are not effective, because the infection is caused by a virus, not by bacteria. Treatment may include:  Increased fluid intake. Sports drinks offer valuable electrolytes, sugars, and fluids.  Breathing heated mist or steam (vaporizer or shower).  Eating chicken soup or other clear broths, and maintaining good nutrition.  Getting plenty of rest.  Using gargles or lozenges for comfort.  Controlling fevers with ibuprofen or acetaminophen as directed by your caregiver.  Increasing usage of your inhaler if you have asthma. Zinc gel and zinc lozenges, taken in the first 24 hours of the common cold, can shorten the duration and lessen the severity of symptoms. Pain medicines may help with fever, muscle aches, and throat pain. A variety of non-prescription medicines are available to treat congestion and runny nose. Your caregiver   can make recommendations and may suggest nasal or lung inhalers for other symptoms.  HOME CARE INSTRUCTIONS   Only take over-the-counter or prescription medicines for pain, discomfort, or fever as directed by your  caregiver.  Use a warm mist humidifier or inhale steam from a shower to increase air moisture. This may keep secretions moist and make it easier to breathe.  Drink enough water and fluids to keep your urine clear or pale yellow.  Rest as needed.  Return to work when your temperature has returned to normal or as your caregiver advises. You may need to stay home longer to avoid infecting others. You can also use a face mask and careful hand washing to prevent spread of the virus. SEEK MEDICAL CARE IF:   After the first few days, you feel you are getting worse rather than better.  You need your caregiver's advice about medicines to control symptoms.  You develop chills, worsening shortness of breath, or brown or red sputum. These may be signs of pneumonia.  You develop yellow or brown nasal discharge or pain in the face, especially when you bend forward. These may be signs of sinusitis.  You develop a fever, swollen neck glands, pain with swallowing, or white areas in the back of your throat. These may be signs of strep throat. SEEK IMMEDIATE MEDICAL CARE IF:   You have a fever.  You develop severe or persistent headache, ear pain, sinus pain, or chest pain.  You develop wheezing, a prolonged cough, cough up blood, or have a change in your usual mucus (if you have chronic lung disease).  You develop sore muscles or a stiff neck. Document Released: 09/25/2000 Document Revised: 06/24/2011 Document Reviewed: 07/07/2013 [employer] Patient Information 2015 [employer]. This information is not intended to replace advice given to you by your health care provider. Make sure you discuss any questions you have with your health care provider.  

## 2014-02-26 NOTE — ED Notes (Signed)
patient here with ongoing cough greater than a week with clear productive secretions, reports that it is worse at night, denies fever. Here for evaluation due to having pulmonary hypertension

## 2014-04-29 ENCOUNTER — Emergency Department (HOSPITAL_BASED_OUTPATIENT_CLINIC_OR_DEPARTMENT_OTHER)
Admission: EM | Admit: 2014-04-29 | Discharge: 2014-04-29 | Disposition: A | Discharge status: Home / Self Care | Payer: Medicare Other | Attending: Emergency Medicine | Admitting: Emergency Medicine

## 2014-04-29 ENCOUNTER — Encounter (HOSPITAL_BASED_OUTPATIENT_CLINIC_OR_DEPARTMENT_OTHER): Payer: Self-pay | Admitting: Emergency Medicine

## 2014-04-29 DIAGNOSIS — I270 Primary pulmonary hypertension: Secondary | ICD-10-CM | POA: Diagnosis not present

## 2014-04-29 DIAGNOSIS — Z8719 Personal history of other diseases of the digestive system: Secondary | ICD-10-CM | POA: Diagnosis not present

## 2014-04-29 DIAGNOSIS — Z8739 Personal history of other diseases of the musculoskeletal system and connective tissue: Secondary | ICD-10-CM | POA: Diagnosis not present

## 2014-04-29 DIAGNOSIS — J019 Acute sinusitis, unspecified: Secondary | ICD-10-CM | POA: Diagnosis not present

## 2014-04-29 DIAGNOSIS — Z7952 Long term (current) use of systemic steroids: Secondary | ICD-10-CM | POA: Insufficient documentation

## 2014-04-29 DIAGNOSIS — R5383 Other fatigue: Secondary | ICD-10-CM | POA: Diagnosis not present

## 2014-04-29 DIAGNOSIS — Z79899 Other long term (current) drug therapy: Secondary | ICD-10-CM | POA: Diagnosis not present

## 2014-04-29 DIAGNOSIS — J069 Acute upper respiratory infection, unspecified: Secondary | ICD-10-CM | POA: Diagnosis not present

## 2014-04-29 DIAGNOSIS — R0981 Nasal congestion: Secondary | ICD-10-CM | POA: Diagnosis present

## 2014-04-29 DIAGNOSIS — Z862 Personal history of diseases of the blood and blood-forming organs and certain disorders involving the immune mechanism: Secondary | ICD-10-CM | POA: Insufficient documentation

## 2014-04-29 HISTORY — DX: Rheumatoid arthritis, unspecified: M06.9

## 2014-04-29 MED ORDER — AZITHROMYCIN 250 MG PO TABS: ORAL_TABLET | ORAL | Status: AC

## 2014-04-29 MED ORDER — HYDROCOD POLST-CHLORPHEN POLST 10-8 MG/5ML PO LQCR: 5.0000 mL | Freq: Two times a day (BID) | ORAL | Status: AC | PRN

## 2014-04-29 NOTE — Discharge Instructions (Signed)
Zithromax as prescribed.  Tussionex as prescribed as needed for cough.  Return to the emergency department if you develop difficulty breathing, chest pains, or other new and concerning symptoms.   Sinusitis Sinusitis is redness, soreness, and inflammation of the paranasal sinuses. Paranasal sinuses are air pockets within the bones of your face (beneath the eyes, the middle of the forehead, or above the eyes). In healthy paranasal sinuses, mucus is able to drain out, and air is able to circulate through them by way of your nose. However, when your paranasal sinuses are inflamed, mucus and air can become trapped. This can allow bacteria and other germs to grow and cause infection. Sinusitis can develop quickly and last only a short time (acute) or continue over a long period (chronic). Sinusitis that lasts for more than 12 weeks is considered chronic.  CAUSES  Causes of sinusitis include:  Allergies.  Structural abnormalities, such as displacement of the cartilage that separates your nostrils (deviated septum), which can decrease the air flow through your nose and sinuses and affect sinus drainage.  Functional abnormalities, such as when the small hairs (cilia) that line your sinuses and help remove mucus do not work properly or are not present. SIGNS AND SYMPTOMS  Symptoms of acute and chronic sinusitis are the same. The primary symptoms are pain and pressure around the affected sinuses. Other symptoms include:  Upper toothache.  Earache.  Headache.  Bad breath.  Decreased sense of smell and taste.  A cough, which worsens when you are lying flat.  Fatigue.  Fever.  Thick drainage from your nose, which often is green and may contain pus (purulent).  Swelling and warmth over the affected sinuses. DIAGNOSIS  Your health care provider will perform a physical exam. During the exam, your health care provider may:  Look in your nose for signs of abnormal growths in your nostrils  (nasal polyps).  Tap over the affected sinus to check for signs of infection.  View the inside of your sinuses (endoscopy) using an imaging device that has a light attached (endoscope). If your health care provider suspects that you have chronic sinusitis, one or more of the following tests may be recommended:  Allergy tests.  Nasal culture. A sample of mucus is taken from your nose, sent to a lab, and screened for bacteria.  Nasal cytology. A sample of mucus is taken from your nose and examined by your health care provider to determine if your sinusitis is related to an allergy. TREATMENT  Most cases of acute sinusitis are related to a viral infection and will resolve on their own within 10 days. Sometimes medicines are prescribed to help relieve symptoms (pain medicine, decongestants, nasal steroid sprays, or saline sprays).  However, for sinusitis related to a bacterial infection, your health care provider will prescribe antibiotic medicines. These are medicines that will help kill the bacteria causing the infection.  Rarely, sinusitis is caused by a fungal infection. In theses cases, your health care provider will prescribe antifungal medicine. For some cases of chronic sinusitis, surgery is needed. Generally, these are cases in which sinusitis recurs more than 3 times per year, despite other treatments. HOME CARE INSTRUCTIONS   Drink plenty of water. Water helps thin the mucus so your sinuses can drain more easily.  Use a humidifier.  Inhale steam 3 to 4 times a day (for example, sit in the bathroom with the shower running).  Apply a warm, moist washcloth to your face 3 to 4 times a day,  or as directed by your health care provider.  Use saline nasal sprays to help moisten and clean your sinuses.  Take medicines only as directed by your health care provider.  If you were prescribed either an antibiotic or antifungal medicine, finish it all even if you start to feel better. SEEK  IMMEDIATE MEDICAL CARE IF:  You have increasing pain or severe headaches.  You have nausea, vomiting, or drowsiness.  You have swelling around your face.  You have vision problems.  You have a stiff neck.  You have difficulty breathing. MAKE SURE YOU:   Understand these instructions.  Will watch your condition.  Will get help right away if you are not doing well or get worse. Document Released: 04/01/2005 Document Revised: 08/16/2013 Document Reviewed: 04/16/2011 [facility] Patient Information 2015 [place], [place]. This information is not intended to replace advice given to you by your health care provider. Make sure you discuss any questions you have with your health care provider.

## 2014-04-29 NOTE — ED Provider Notes (Signed)
CSN: [id number]     Arrival date & time 04/29/14  1032 History   First MD Initiated Contact with Patient 04/29/14 1048     Chief Complaint  Patient presents with  . URI     (Consider location/radiation/quality/duration/timing/severity/associated sxs/prior Treatment) HPI Comments: Patient is a 29 year old female with history of rheumatoid arthritis and mixed connective tissue disease. She presents today with complaints of URI symptoms that have been ongoing for 10 days. She is now coughing up green sputum but denies any shortness of breath. She has tried over-the-counter medications without relief.  Patient is a 29 y.o. female presenting with URI. The history is provided by the patient.  URI Presenting symptoms: congestion, cough, ear pain, fatigue, fever, rhinorrhea and sore throat   Severity:  Moderate Onset quality:  Gradual Duration:  10 days Timing:  Constant Progression:  Worsening Chronicity:  New Relieved by:  Nothing Worsened by:  Nothing tried Ineffective treatments:  Decongestant   Past Medical History  Diagnosis Date  . Arthritis, rheumatoid   . Polymyositis   . Anemia   . Mixed connective tissue disease   . Pulmonary hypertension   . Esophagitis   . RA (rheumatoid arthritis)    No past surgical history on file. No family history on file. History  Substance Use Topics  . Smoking status: Never Smoker   . Smokeless tobacco: Not on file  . Alcohol Use: No   OB History    No data available     Review of Systems  Constitutional: Positive for fever and fatigue.  HENT: Positive for congestion, ear pain, rhinorrhea and sore throat.   Respiratory: Positive for cough.   All other systems reviewed and are negative.     Allergies  Tramadol  Home Medications   Prior to Admission medications   Medication Sig Start Date End Date Taking? Authorizing Provider  ambrisentan (LETAIRIS) 10 MG tablet Take 10 mg by mouth daily.    Historical Provider, MD  dapsone  25 MG tablet Take 25 mg by mouth daily.    Historical Provider, MD  hydroxychloroquine (PLAQUENIL) 200 MG tablet Take 200 mg by mouth 2 (two) times daily.    Historical Provider, MD  ibuprofen (ADVIL,MOTRIN) 800 MG tablet Take 800 mg by mouth 2 (two) times daily.    Historical Provider, MD  lisinopril (PRINIVIL,ZESTRIL) 5 MG tablet Take 5 mg by mouth daily.    Historical Provider, MD  mycophenolate (MYFORTIC) 360 MG TBEC Take 500 mg by mouth 2 (two) times daily.    Historical Provider, MD  omeprazole (PRILOSEC) 20 MG capsule Take 1 capsule (20 mg total) by mouth daily. 12/18/10 02/26/14  Glynn Octave, MD  predniSONE (DELTASONE) 10 MG tablet Take 15 mg by mouth daily.      Historical Provider, MD   BP 126/77 mmHg  Pulse 107  Temp(Src) 98.6 F (37 C) (Oral)  Resp 16  Ht 5\' 7"  (1.702 m)  Wt 118 lb (53.524 kg)  BMI 18.48 kg/m2  SpO2 100%  LMP 04/17/2014 Physical Exam  Constitutional: She is oriented to person, place, and time. She appears well-developed and well-nourished. No distress.  HENT:  Head: Normocephalic and atraumatic.  Right Ear: External ear normal.  Left Ear: External ear normal.  Neck: Normal range of motion. Neck supple.  Cardiovascular: Normal rate and regular rhythm.  Exam reveals no gallop and no friction rub.   No murmur heard. Pulmonary/Chest: Effort normal and breath sounds normal. No respiratory distress. She has no wheezes.  Abdominal: Soft. Bowel sounds are normal. She exhibits no distension. There is no tenderness.  Musculoskeletal: Normal range of motion.  Lymphadenopathy:    She has no cervical adenopathy.  Neurological: She is alert and oriented to person, place, and time.  Skin: Skin is warm and dry. She is not diaphoretic.  Nursing note and vitals reviewed.   ED Course  Procedures (including critical care time) Labs Review Labs Reviewed - No data to display  Imaging Review No results found.   EKG Interpretation None      MDM   Final  diagnoses:  None    Patient has been ill for 10 days and symptoms are worsening despite over-the-counter medications. She also has a history of rheumatoid arthritis and mixed connective tissue disease for which she is on immunosuppressive therapy. For these reasons she will be treated with Zithromax and cough medication. To return as needed for any problems.    Geoffery Lyons, MD 04/29/14 1105

## 2014-04-29 NOTE — ED Notes (Signed)
Pt states she has been having nasal congestion, runny nose, cough, and ears feel stopped up for over one week.  Some chills, and hot flashes.

## 2014-11-05 ENCOUNTER — Inpatient Hospital Stay: Admit: 2014-11-05 | Discharge: 2014-11-05 | Attending: Emergency Medicine

## 2014-11-05 DIAGNOSIS — T783XXA Angioneurotic edema, initial encounter: Secondary | ICD-10-CM

## 2014-11-05 MED ORDER — DIPHENHYDRAMINE HCL 25 MG PO TABS
25 MG | Freq: Once | ORAL | Status: AC
Start: 2014-11-05 — End: 2014-11-05
  Administered 2014-11-05: 21:00:00 50 mg via ORAL

## 2014-11-05 MED ORDER — PREDNISONE 20 MG PO TABS
20 MG | Freq: Once | ORAL | Status: AC
Start: 2014-11-05 — End: 2014-11-05
  Administered 2014-11-05: 21:00:00 60 mg via ORAL

## 2014-11-05 MED ORDER — FAMOTIDINE 20 MG PO TABS
20 | ORAL_TABLET | Freq: Two times a day (BID) | ORAL | 0 refills | 30.00000 days | Status: DC
Start: 2014-11-05 — End: 2017-02-25

## 2014-11-05 MED ORDER — PREDNISONE 10 MG PO TABS
10 | ORAL_TABLET | Freq: Every day | ORAL | 0 refills | 10.00000 days | Status: AC
Start: 2014-11-05 — End: 2014-11-09

## 2014-11-05 MED ORDER — FAMOTIDINE 20 MG PO TABS
20 MG | Freq: Once | ORAL | Status: AC
Start: 2014-11-05 — End: 2014-11-05
  Administered 2014-11-05: 21:00:00 40 mg via ORAL

## 2014-11-05 MED ORDER — DIPHENHYDRAMINE HCL 25 MG PO CAPS
25 | ORAL_CAPSULE | ORAL | 0 refills | 13.50000 days | Status: AC | PRN
Start: 2014-11-05 — End: 2014-11-15

## 2014-11-05 MED ORDER — DIPHENHYDRAMINE HCL 50 MG/ML IJ SOLN
50 MG/ML | Freq: Once | INTRAMUSCULAR | Status: DC
Start: 2014-11-05 — End: 2014-11-05

## 2014-11-05 MED FILL — FAMOTIDINE 20 MG PO TABS: 20 mg | ORAL | Qty: 2

## 2014-11-05 MED FILL — PREDNISONE 20 MG PO TABS: 20 mg | ORAL | Qty: 3

## 2014-11-05 MED FILL — DIPHENHYDRAMINE HCL 25 MG PO TABS: 25 mg | ORAL | Qty: 2

## 2014-11-05 NOTE — ED Notes (Signed)
 Pt resting quietly on stretcher; no resp/swallowing distress noted; pt told CNP after triage that she had been taking Lisinopril (did not list that during triage).     Elveria DELENA Gartner, RN  11/05/14 1736

## 2014-11-05 NOTE — ED Provider Notes (Signed)
 I independently performed a history and physical on Shelly Richard.   All diagnostic, treatment, and disposition decisions were made by myself in conjunction with the advanced practice provider.     Briefly, this is a 29 y.o. female here for lip swelling, onset last night, improved since onset. No SOB, chest pain, or vomiting. H/o same, dx with angio edema. Pt is on lisinopril.    On exam, VSS, mild lower lip edema, no stridor or oropharyngeal involvement. Heart RRR, no MRGs, lungs CTAB      MDM  Angioedema, mild, no airway compromise, improved per pt, d/c ACEi, PCP f/u.     For further details of [facility] emergency department encounter, please see documentation by advanced practice provider.       Rankin CHRISTELLA Haggard, MD  11/05/14 [phone]

## 2014-11-05 NOTE — ED Provider Notes (Signed)
 @USACSLOGO @    [facility] ED  eMERGENCY dEPARTMENT eNCOUnter      Pt Name: Shelly Richard  MRN: [id number]  Birthdate 30-Oct-1985  Date of evaluation: 11/05/2014  Provider: Merrill FORBES Charleston, CNP      Chief Complaint:    Chief Complaint   Patient presents with    Facial Swelling     pt c/o swelling of bottom lip since last night; no known inj; denies new meds/soaps/laundry products/make up       Nursing Notes, Past Medical Hx, Past Surgical Hx, Social Hx, Allergies, and Family Hx were all reviewed and agreed with or any disagreements were addressed in the HPI.    HPI:  (Location, Duration, Timing, Severity, Quality, Assoc Sx, Context, Modifying factors)  This is a  29 y.o. female presents to emergency department to swelling of the bottom lip since last night. She denies any new lotions, creams, soaps, detergents, foods or any other additional environmental exposures or unknown allergies. She does report that she is allergic to peanuts however has not consumed any nuts.  She denies any acute pain on exam.  She denies any chest pain or shortness of breath.  She denies any fever or chills.  She doesn't try taking Benadryl  around 5:00 this morning however has not taken medication since.  Therefore, has no additional complaints.  She has no additional aggravating or alleviating factors.  She presents awake, alert and no acute respiratory distress.    Past Medical/Surgical History:      Diagnosis Date    Asthma     Diabetes mellitus (HCC)          Procedure Laterality Date    Cyst removal         Medications:  Previous Medications    ACETAMINOPHEN -CODEINE (TYLENOL  #3) 300-30 MG PER TABLET    Take 1 tablet by mouth every 4 hours as needed for Pain    ALBUTEROL  (PROVENTIL  HFA;VENTOLIN  HFA) 108 (90 BASE) MCG/ACT INHALER    Inhale 2 puffs into the lungs every 6 hours as needed.      GLIPIZIDE (GLUCOTROL) 5 MG TABLET    Take 5 mg by mouth daily    METFORMIN  (GLUCOPHAGE ) 500 MG TABLET    Take 1,000 mg by mouth  2 times daily (with meals).    MONTELUKAST  SODIUM (SINGULAIR  PO)    Take 10 mg by mouth daily     NAPROXEN  (NAPROSYN ) 500 MG TABLET    Take 1 tablet by mouth 2 times daily for 20 doses    ONDANSETRON  (ZOFRAN  ODT) 4 MG DISINTEGRATING TABLET    Take 2 tablets by mouth every 8 hours as needed for Nausea for 20 doses.    SIMVASTATIN  PO    Take by mouth Indications: dosage unknown    SPACER/AERO-HOLDING CHAMBERS (AEROCHAMBER) MISC    1 Device every 6 hours. Use with Inhalers         Review of Systems:  Review of Systems   Constitutional: Negative for chills and fever.   HENT: Negative for congestion and sore throat.         Patient complains of lower lip swelling.  She states she noticed it last evening.  Denies any new lotions, creams, soaps, detergents, foods or any other environmental exposures.  She does report that she is on lisinopril.   Respiratory: Negative for cough.    Cardiovascular: Negative for chest pain.   Gastrointestinal: Negative for abdominal pain, diarrhea, nausea and vomiting.   Genitourinary: Negative  for dysuria, frequency, hematuria and urgency.   Musculoskeletal: Negative for back pain.   Skin: Negative for color change.   Neurological: Negative for weakness, numbness and headaches.   Hematological: Does not bruise/bleed easily.     Positives and Pertinent negatives as per HPI.  Except as noted above in the ROS, problem specific ROS was completed and is negative.    Physical Exam:  Physical Exam   Constitutional: She is oriented to person, place, and time. She appears well-developed and well-nourished.   HENT:   Head: Normocephalic.   Right Ear: External ear normal.   Left Ear: External ear normal.   Mouth/Throat: Oropharynx is clear and moist.   Patient's oropharyngeal is unremarkable.  No erythema or swelling.  She does have localized swelling to the lower lip.  She has normal phonation and no muffled voice or hot potato voice.   Eyes: Right eye exhibits no discharge. Left eye exhibits no  discharge. No scleral icterus.   Neck: Normal range of motion. Neck supple.   Cardiovascular: Normal rate and intact distal pulses.    Patient has normal S1 and 2.  Peripheral pulses are 2+ and no visible edema observed.  Capillary refill less than 3 seconds.   Pulmonary/Chest: Effort normal and breath sounds normal. No respiratory distress. She has no wheezes.   Airway is patent with symmetric rise and fall of chest.  Lungs are clear anteriorly and posteriorly.  Patient is not tachypneic or dyspneic.   Abdominal: Soft. Bowel sounds are normal. There is no tenderness. There is no guarding.   Musculoskeletal: Normal range of motion.   Neurological: She is alert and oriented to person, place, and time.   Skin: Skin is warm. She is not diaphoretic. No pallor.   Patient does have lower lip swelling, appears to be angioedema however there is no visible lesions or breaks in skin integrity.   Psychiatric: She has a normal mood and affect. Her behavior is normal.   Nursing note and vitals reviewed.      MEDICAL DECISION MAKING    Vitals:    Vitals:    11/05/14 1630 11/05/14 1634   BP: 121/81    Pulse: 101    Resp: 16    Temp: 98.5 F (36.9 C)    TempSrc: Oral    SpO2: 98%    Weight:  210 lb (95.3 kg)   Height:  5' 4 (1.626 m)       LABS: Labs Reviewed - No data to display     Remainder of labs reviewed and were negative at this time or not returned at the time of this note.    RADIOLOGY:   Non-plain film images such as CT, Ultrasound and MRI are read by the radiologist. I, Magaline Steinberg E Sayre Mazor, CNP have directly visualized the radiologic plain film image(s) with the below findings:      Interpretation per the Radiologist below, if available at the time of this note:    No orders to display        No results found.     MEDICAL DECISION MAKING / ED COURSE:      PROCEDURES:   Procedures    None    Patient was given:  Medications   predniSONE  (DELTASONE ) tablet 60 mg (60 mg Oral Given 11/05/14 1726)   famotidine  (PEPCID ) tablet 40  mg (40 mg Oral Given 11/05/14 1727)   diphenhydrAMINE  (BENADRYL ) tablet 50 mg (50 mg Oral Given 11/05/14 1727)  Patient presents emergency department with swelling to the lower lip.  She denies any known injuries, or exposures to any new lotions, creams, soaps, detergents or foods.  During evaluation she does report that she is on lisinopril.  She states she has never had high blood pressure however, was started on the medication because they found protein in my urine.  She does report being a diabetic.  She denies any additional complaints.    After evaluation and examination the patient is appears that the patient is having localized angioedema secondary to lisinopril, ACE inhibitor.  I then ordered the patient Benadryl , prednisone  and pepcid . However, on exam she has no acute respiratory distress, normal phonation, no muffled voice, no airway compromise. Therefore, I estimate there is LOW risk for ANAPHYLAXIS, STEVENS-JOHNSON SYNDROME, OR TOXIC EPIDERMAL NECROLYSIS thus I consider the discharge disposition reasonable.      Overall, the patient tolerated their visit well.  I saw the patient independently, however, after my examination I consulted with Dr. Hudson for consultation, who also evaluated the patient.  The patient and / or the family were informed of the results of any tests, a time was given to answer questions, a plan was proposed and they agreed with plan.  Therefore, the patient was instructed to stop taking her lisinopril immediately.  She was also prescribed Benadryl , Pepcid  and prednisone  to take for the next 4 days. She was to return to emergency department immediately if any symptoms are worsening.  The patient verbalized understanding of discharge instructions.  Therefore, the patient was discharged from the emergency department stable condition.    CLINICAL IMPRESSION:  1. Angioedema, initial encounter    2. Lip swelling        DISPOSITION Decision to Discharge    PATIENT REFERRED  TO:  Cira CHRISTELLA Hollow  [address]  [address]  [place] [address]  [phone]    Schedule an appointment as soon as possible for a visit in 2 days  [facility] doctor on Monday to schedule an appointment for reevaluation      DISCHARGE MEDICATIONS:  New Prescriptions    DIPHENHYDRAMINE  (BENADRYL ) 25 MG CAPSULE    Take 1 capsule by mouth every 4 hours as needed for Itching    FAMOTIDINE  (PEPCID ) 20 MG TABLET    Take 1 tablet by mouth 2 times daily for 4 days    PREDNISONE  (DELTASONE ) 10 MG TABLET    Take 2 tablets by mouth daily for 4 days       DISCONTINUED MEDICATIONS:  Discontinued Medications    No medications on file              (Please note the MDM and HPI sections of this note were completed with a voice recognition program.  Efforts were made to edit the dictations but occasionally words are mis-transcribed.)    Electronically signed, Merrill FORBES Charleston, CNP,        Merrill FORBES Damaris Abeln, CNP  11/05/14 1758

## 2014-11-05 NOTE — ED Notes (Signed)
 Pt alert & oriented, skin warm & dry, resp unlabored; appears in no acute distress; family member @ bedside.     Elveria DELENA Gartner, RN  11/05/14 [phone]

## 2014-11-05 NOTE — Discharge Instructions (Signed)
 Stop taking your lisinopril immediately.  Call your doctor on Monday to schedule an appointment for reevaluation.  Return to the emergency department if any symptoms worsen.    Angioedema: Care Instructions  Your Care Instructions  Angioedema is an allergic reaction. It causes swelling and welts in the deep layers of the skin. Angioedema can sometimes occur along with hives. Hives are an allergic reaction in the outer layers of the skin. Angioedema can range from mild to severe. Painful welts can develop on the face. Angioedema can also occur on other parts of the body. In severe cases, the inside of the throat can swell and make it hard to breathe.  Many things can cause this condition, including foods, insect bites, and medicines (such as aspirin  and some blood pressure medicines). It also can run in families. Sometimes you may know what caused the reaction, but other times you may not know.  Follow-up care is a key part of your treatment and safety. Be sure to make and go to all appointments, and call your doctor if you are having problems. It's also a good idea to know your test results and keep a list of the medicines you take.  How can you care for yourself at home?   Take your medicines exactly as prescribed. Call your doctor if you think you are having a problem with your medicine. You will get more details on the specific medicines your doctor prescribes. Some medicines used to treat angioedema can make you too sleepy to drive safely. Do not drive if you take medicine that may make you sleepy.   Avoid foods or medicine that may have triggered the swelling.   For comfort:   Try taking a cool bath. Or place a cool, wet towel on the swollen area.   Avoid hot baths and showers.   Wear loose clothing.   Your doctor may prescribe a shot of epinephrine  to carry with you in case you have a severe reaction. Learn how to give yourself the shot and keep it with you at all times. Make sure it has not  expired.  When should you call for help?  Give an epinephrine  shot if:   You think you are having a severe allergic reaction.   You have symptoms in more than one body area, such as mild nausea and an itchy mouth.  After giving an epinephrine  shot call 911, even if you feel better.  Call 911 if:   You have symptoms of a severe allergic reaction. These may include:   Sudden raised, red areas (hives) all over your body.   Swelling of the throat, mouth, lips, or tongue.   Trouble breathing.   Passing out (losing consciousness). Or you may feel very lightheaded or suddenly feel weak, confused, or restless.   You have been given an epinephrine  shot, even if you feel better.  Call your doctor now or seek immediate medical care if:   You have symptoms of an allergic reaction, such as:   A rash or hives (raised, red areas on the skin).   Itching.   Swelling.   Belly pain, nausea, or vomiting.  Watch closely for changes in your health, and be sure to contact your doctor if:   You do not get better as expected.   Where can you learn more?   Go to [email or web address] and sign in to your MyChart account. Enter [id number] in the Search Health Information box to learn more about Angioedema: Care Instructions.  If you do not have an account, please click on the Sign Up Now link.    2006-2016 [employer]. Care instructions adapted under license by [facility]. This care instruction is for use with your licensed healthcare professional. If you have questions about a medical condition or this instruction, always ask your healthcare professional. [employer], Inc[employer] disclaims any warranty or liability for your use of this information.  Content Version: 10.8.513193; Current as of: February 25, 2014

## 2014-11-05 NOTE — ED Notes (Signed)
EDP in with pt.     Jamelle Haring, RN  11/05/14 1750

## 2015-04-07 ENCOUNTER — Ambulatory Visit: Payer: PRIVATE HEALTH INSURANCE | Primary: Oncology

## 2015-04-20 DIAGNOSIS — R519 Headache, unspecified: Secondary | ICD-10-CM

## 2015-04-20 NOTE — ED Provider Notes (Signed)
 [facility]  e[facility] dEPARTMENT eNCOUnter      Pt Name: Shelly Richard  MRN: [id number]  Birthdate Mar 26, 1986  Date of evaluation: 04/20/2015  Provider: Adine JINNY Das, PA-C      Chief Complaint:    Chief Complaint   Patient presents with    Facial Pain     Pt states she had left sided face pain that started 3 days ago. Now having lower left back pain. Pt denies any injury, no blurred vision.        Nursing Notes, Past Medical Hx, Past Surgical Hx, Social Hx, Allergies, and Family Hx were all reviewed and agreed with or any disagreements were addressed in the HPI.    HPI:  (Location, Duration, Timing, Severity, Quality, Assoc Sx, Context, Modifying factors)  This is a  30 y.o. female with a history of hyperlipidemia, diabetes and asthma who presents to the emergency department tonight with 2 different complaints.  Patient states for the last 3 days she has had left-sided facial pain.  She denies injury to her face.  She states she has had mild sinus congestion and a runny nose but denies dental pain, sore throat or otalgia.  She denies neck pain.  She denies headache, lightheadedness or dizziness.  Patient also states she has had left-sided low back pain for the last several days.  She has had left-sided low back pain in the past.  She denies any recent injury.  She denies changes in bowel or bladder habits, incontinence, saddle paresthesia, numbness, tingling, sensory or motor deficits in her extremities.  She has no GI complaints.  She has no urinary complaints and denies vaginal pain, discharge or bleeding stating her LMP was 12/20.    Past Medical/Surgical History:      Diagnosis Date    Asthma     Diabetes mellitus (HCC)     Hyperlipidemia          Procedure Laterality Date    Cyst removal         Medications:  Previous Medications    ACETAMINOPHEN -CODEINE (TYLENOL  #3) 300-30 MG PER TABLET    Take 1 tablet by mouth every 4 hours  as needed for Pain    ALBUTEROL  (PROVENTIL  HFA;VENTOLIN  HFA) 108 (90 BASE) MCG/ACT INHALER    Inhale 2 puffs into the lungs every 6 hours as needed.      FAMOTIDINE  (PEPCID ) 20 MG TABLET    Take 1 tablet by mouth 2 times daily for 4 days    GLIPIZIDE (GLUCOTROL) 5 MG TABLET    Take 5 mg by mouth daily    METFORMIN  (GLUCOPHAGE ) 500 MG TABLET    Take 1,000 mg by mouth 2 times daily (with meals).    MONTELUKAST  SODIUM (SINGULAIR  PO)    Take 10 mg by mouth daily     NAPROXEN  (NAPROSYN ) 500 MG TABLET    Take 1 tablet by mouth 2 times daily for 20 doses    ONDANSETRON  (ZOFRAN  ODT) 4 MG DISINTEGRATING TABLET    Take 2 tablets by mouth every 8 hours as needed for Nausea for 20 doses.    SIMVASTATIN  PO    Take 40 mg by mouth daily Indications: dosage unknown     SPACER/AERO-HOLDING CHAMBERS (AEROCHAMBER) MISC    1 Device every 6 hours. Use with Inhalers         Review of Systems:  Review of Systems  Constitutional: Negative for chills, diaphoresis, fatigue and fever.   HENT: Positive for congestion, postnasal drip, rhinorrhea and sinus pressure. Negative for ear discharge, ear pain, facial swelling and sore throat.    Respiratory: Negative for cough, chest tightness and shortness of breath.    Cardiovascular: Negative for chest pain, palpitations and leg swelling.   Gastrointestinal: Negative for abdominal pain, diarrhea, nausea and vomiting.   Genitourinary: Negative for difficulty urinating, dysuria, flank pain, frequency, hematuria, urgency, vaginal bleeding, vaginal discharge and vaginal pain.   Musculoskeletal: Positive for back pain. Negative for arthralgias, gait problem, joint swelling, myalgias, neck pain and neck stiffness.   Neurological: Negative for dizziness, weakness, light-headedness, numbness and headaches.   All other systems reviewed and are negative.    Positives and Pertinent negatives as per HPI.  Except as noted above in the ROS, problem specific ROS was completed and is negative.    Physical  Exam:  Physical Exam   Constitutional: She is oriented to person, place, and time. Vital signs are normal. She appears well-developed and well-nourished.  Non-toxic appearance. She does not have a sickly appearance. She does not appear ill. No distress.   HENT:   Head: Normocephalic and atraumatic.   Right Ear: Hearing, tympanic membrane, external ear and ear canal normal.   Left Ear: Hearing, tympanic membrane, external ear and ear canal normal.   Nose: Mucosal edema and rhinorrhea present.   Mouth/Throat: Uvula is midline, oropharynx is clear and moist and mucous membranes are normal.   Eyes: Conjunctivae and EOM are normal. Pupils are equal, round, and reactive to light. Right eye exhibits no discharge. Left eye exhibits no discharge.   Neck: Normal range of motion. Neck supple. No JVD present.   Cardiovascular: Normal rate, regular rhythm, normal heart sounds and intact distal pulses.  Exam reveals no gallop and no friction rub.    No murmur heard.  Pulmonary/Chest: Effort normal and breath sounds normal. No respiratory distress.   Abdominal: Soft. Bowel sounds are normal. She exhibits no distension. There is no tenderness. There is no rebound and no guarding.   Musculoskeletal: Normal range of motion.        Lumbar back: She exhibits spasm.   Patient denies midline tenderness on palpation of the cervical, thoracic or lumbar spine. she does have tenderness on palpation of the left lumbar paraspinous muscles. she has full range of motion of all extremities with 5/5 motor strength symmetrically.  Patient denies sensory deficits and extremities are neurovascularly intact.  Extremities with good color and capillary refill <2 seconds. There is no extremity edema or signs of vascular compromise.      Lymphadenopathy:     She has no cervical adenopathy.   Neurological: She is alert and oriented to person, place, and time. She has normal strength. No sensory deficit. Coordination and gait normal. GCS eye subscore is 4.  GCS verbal subscore is 5. GCS motor subscore is 6.   Skin: Skin is warm and dry. She is not diaphoretic. No pallor.   Psychiatric: She has a normal mood and affect. Her behavior is normal.   Nursing note and vitals reviewed.      MEDICAL DECISION MAKING    Vitals:    Vitals:    04/20/15 1934   BP: 137/83   Pulse: 99   Resp: 15   Temp: 99 F (37.2 C)   TempSrc: Infrared   SpO2: 99%   Weight: 210 lb (95.3 kg)   Height: 5' 4 (1.626  m)       LABS:   Labs Reviewed   URINE RT REFLEX TO CULTURE - Abnormal; Notable for the following:        Result Value    Glucose, Ur 500 (*)     Leukocyte Esterase, Urine SMALL (*)     All other components within normal limits   MICROSCOPIC URINALYSIS - Abnormal; Notable for the following:     Bacteria, UA RARE (*)     WBC, UA 6 (*)     All other components within normal limits   URINE CULTURE   PREGNANCY, URINE        Remainder of labs reviewed and were negative at this time or not returned at the time of this note.    MEDICAL DECISION MAKING / ED COURSE:      PROCEDURES:   Procedures    None    Patient was given:  Medications - No data to display    Patient presented to the emergency department tonight with 2 different complaints.  She states for the last 3 days she has had left facial pain.  She denies any injury to her face.  Her boyfriend tells me that she has complained of this pain for 6 years.  She has had mild sinus congestion and runny nose.  HEENT exam is unremarkable.  Patient denies headache, lightheadedness or dizziness.  She denies neck pain.  She denies chest pain, shortness of breath or coughing and lungs are CTA bilaterally.  Patient also states she has had an exacerbation of her chronic intermittent left-sided low back pain.  She denies any recent injury.  There are no signs of cauda equina.  She has no midline cervical, thoracic or lumbar spine tenderness. She has no GI complaints.  She has no urinary complaints and denies vaginal pain, discharge or bleeding.   Urinalysis is unremarkable and urine hCG is negative. Pt denies any history of new numbness, weakness, incontinence of bowel or bladder, constipation, saddle anesthesia or paresthesias. I estimate there is LOW risk for ABDOMINAL AORTIC ANEURYSM, CAUDA EQUINA SYNDROME, EPIDURAL MASS LESION, OR CORD COMPRESSION, thus I consider the discharge disposition reasonable.  Patient will be discharged with Naprosyn  and Flexeril .  She is hemodynamically stable and nonfebrile.    The patient tolerated their visit well.  I saw the patient independently with physician available for consultation as needed.  The patient and / or the family were informed of the results of any tests, a time was given to answer questions, a plan was proposed and they agreed with plan.      CLINICAL IMPRESSION:  1. Left facial pain    2. Left-sided low back pain without sciatica, unspecified chronicity        DISPOSITION     PATIENT REFERRED TO:  Lisbeth M Lazaron  [address]  [address] [place] [address]  [phone]    Call in 1 day  Follow-up from emergency department in 1-3 days      DISCHARGE MEDICATIONS:  New Prescriptions    CYCLOBENZAPRINE  (FLEXERIL ) 10 MG TABLET    Take 1 tablet by mouth 3 times daily as needed for Muscle spasms    NAPROXEN  (NAPROSYN ) 500 MG TABLET    Take 1 tablet by mouth 2 times daily for 20 doses       DISCONTINUED MEDICATIONS:  Discontinued Medications    No medications on file              (  Please note the MDM and HPI sections of this note were completed with a voice recognition program.  Efforts were made to edit the dictations but occasionally words are mis-transcribed.)    Electronically signed, Adine JINNY Das, PA-C,          Adine JINNY Das, PA-C  04/20/15 2055

## 2015-04-20 NOTE — Discharge Instructions (Signed)
 Back Pain: Care Instructions  Your Care Instructions     Back pain has many possible causes. It is often related to problems with muscles and ligaments of the back. It may also be related to problems with the nerves, discs, or bones of the back. Moving, lifting, standing, sitting, or sleeping in an awkward way can strain the back. Sometimes you don't notice the injury until later. Arthritis is another common cause of back pain.  Although it may hurt a lot, back pain usually improves on its own within several weeks. Most people recover in 12 weeks or less. Using good home treatment and being careful not to stress your back can help you feel better sooner.  Follow-up care is a key part of your treatment and safety. Be sure to make and go to all appointments, and call your doctor if you are having problems. Its also a good idea to know your test results and keep a list of the medicines you take.  How can you care for yourself at home?   Sit or lie in positions that are most comfortable and reduce your pain. Try one of these positions when you lie down:   Lie on your back with your knees bent and supported by large pillows.   Lie on the floor with your legs on the seat of a sofa or chair.   Lie on your side with your knees and hips bent and a pillow between your legs.   Lie on your stomach if it does not make pain worse.   Do not sit up in bed, and avoid soft couches and twisted positions. Bed rest can help relieve pain at first, but it delays healing. Avoid bed rest after the first day of back pain.   Change positions every 30 minutes. If you must sit for long periods of time, take breaks from sitting. Get up and walk around, or lie in a comfortable position.   Try using a heating pad on a low or medium setting for 15 to 20 minutes every 2 or 3 hours. Try a warm shower in place of one session with the heating pad.   You can also try an ice pack for 10 to 15 minutes every 2 to 3 hours. Put a thin cloth  between the ice pack and your skin.   Take pain medicines exactly as directed.   If the doctor gave you a prescription medicine for pain, take it as prescribed.   If you are not taking a prescription pain medicine, ask your doctor if you can take an over-the-counter medicine.   Take short walks several times a day. You can start with 5 to 10 minutes, 3 or 4 times a day, and work up to longer walks. Walk on level surfaces and avoid hills and stairs until your back is better.   Return to work and other activities as soon as you can. Continued rest without activity is usually not good for your back.   To prevent future back pain, do exercises to stretch and strengthen your back and stomach. Learn how to use good posture, safe lifting techniques, and proper body mechanics.  When should you call for help?  Call your doctor now or seek immediate medical care if:   You have new or worsening numbness in your legs.   You have new or worsening weakness in your legs. (This could make it hard to stand up.)   You lose control of your bladder or bowels.  Watch closely for changes in your health, and be sure to contact your doctor if:   Your pain gets worse.   You are not getting better after 2 weeks.  Where can you learn more?  Go to [email or web address] and sign in to your MyChart account. Enter [phone] in the Search Health Information box to learn more about Back Pain: Care Instructions.    If you do not have an account, please click on the Sign Up Now link.   2006-2016 [employer]. Care instructions adapted under license by [facility]. This care instruction is for use with your licensed healthcare professional. If you have questions about a medical condition or this instruction, always ask your healthcare professional. [employer] disclaims any warranty or liability for your use of this information.  Content Version: 11.0.578772; Current as of: Sep 05, 2014          Head or Face Pain: Care Instructions  Your Care Instructions  Common causes of head or face pain are allergies, stress, and injuries. Other causes include tooth problems and sinus infections. Eating certain foods, such as chocolate or cheese, or drinking certain liquids, such as coffee or cola, can cause head pain for some people.  If you have mild head pain, you may not need treatment. It is important to watch your symptoms and talk to your doctor if your pain continues or gets worse.  Follow-up care is a key part of your treatment and safety. Be sure to make and go to all appointments, and call your doctor if you are having problems. It's also a good idea to know your test results and keep a list of the medicines you take.  How can you care for yourself at home?   Take pain medicines exactly as directed.   If the doctor gave you a prescription medicine for pain, take it as prescribed.   If you are not taking a prescription pain medicine, ask your doctor if you can take an over-the-counter pain medicine.   Take it easy for the next few days or longer if you are not feeling well.   Use a warm, moist towel or heating pad set on low to relax tight muscles in your shoulder and neck. Have someone gently massage your neck and shoulders.   Put ice or a cold pack on the area for 10 to 20 minutes at a time. Put a thin cloth between the ice and your skin.  When should you call for help?  Call 911 anytime you think you may need emergency care. For example, call if:   You have twitching, jerking, or a seizure.   You passed out (lost consciousness).   You have symptoms of a stroke. These may include:   Sudden numbness, tingling, weakness, or loss of movement in your face, arm, or leg, especially on only one side of your body.   Sudden vision changes.   Sudden trouble speaking.   Sudden confusion or trouble understanding simple statements.   Sudden problems with walking or balance.   A sudden, severe  headache that is different from past headaches.   You have jaw pain and pain in your chest, shoulder, neck, or arm.  Call your doctor now or seek immediate medical care if:   You have a fever with a stiff neck or a severe headache.   You have nausea and vomiting, or you cannot keep food or liquids down.  Watch closely for changes in your health, and be  sure to contact your doctor if:   Your head or face pain does not get better as expected.  Where can you learn more?  Go to [email or web address] and sign in to your MyChart account. Enter P568 in the Search Health Information box to learn more about Head or Face Pain: Care Instructions.    If you do not have an account, please click on the Sign Up Now link.   2006-2016 [employer]. Care instructions adapted under license by [facility]. This care instruction is for use with your licensed healthcare professional. If you have questions about a medical condition or this instruction, always ask your healthcare professional. [employer] disclaims any warranty or liability for your use of this information.  Content Version: 11.0.578772; Current as of: Sep 09, 2014

## 2015-04-21 ENCOUNTER — Inpatient Hospital Stay: Admit: 2015-04-21 | Discharge: 2015-04-21 | Disposition: A | Discharge status: Home / Self Care

## 2015-04-21 LAB — URINALYSIS WITH REFLEX TO CULTURE
Bilirubin Urine: NEGATIVE
Blood, Urine: NEGATIVE
Glucose, Ur: 500 mg/dL — AB
Ketones, Urine: NEGATIVE mg/dL
Nitrite, Urine: NEGATIVE
Protein, UA: NEGATIVE mg/dL
Specific Gravity, UA: 1.027 (ref 1.005–1.030)
Urobilinogen, Urine: 0.2 E.U./dL (ref <2.0)
pH, UA: 5.5 (ref 5.0–8.0)

## 2015-04-21 LAB — MICROSCOPIC URINALYSIS
Epithelial Cells, UA: 4 /HPF (ref 0–5)
Hyaline Casts, UA: 1 /HPF (ref 0–8)
RBC, UA: 1 /HPF (ref 0–4)
WBC, UA: 6 /HPF — ABNORMAL HIGH (ref 0–5)

## 2015-04-21 LAB — PREGNANCY, URINE: Pregnancy, Urine: NEGATIVE

## 2015-04-21 MED ORDER — NAPROXEN 500 MG PO TABS
500 | ORAL_TABLET | Freq: Two times a day (BID) | ORAL | 0 refills | 30.00000 days | Status: DC
Start: 2015-04-21 — End: 2017-05-22

## 2015-04-21 MED ORDER — CYCLOBENZAPRINE HCL 10 MG PO TABS
10 | ORAL_TABLET | Freq: Three times a day (TID) | ORAL | 0 refills | 30.00000 days | Status: AC | PRN
Start: 2015-04-21 — End: 2015-04-27

## 2015-04-25 LAB — CULTURE, URINE
Urine Culture, Routine: 25000
Urine Culture, Routine: <10000
Urine Culture, Routine: <10000
Urine Culture, Routine: <50000 — AB

## 2015-06-22 LAB — GENITAL CULTURE: Organism Identification: NORMAL

## 2015-07-19 LAB — BACTERIAL VAGINOSIS PANEL
Candida Species: NOT DETECTED
Gardnerella vaginosis: NOT DETECTED
Trichomonas vaginosis: NOT DETECTED

## 2015-07-19 LAB — CHLAMYDIA/N. GONORRHOEAE DNA, PCR
Chlamydia, Nuc. Acid Amp: NOT DETECTED
Gonococcus by Nucleic Acid Amp: NOT DETECTED

## 2015-11-14 ENCOUNTER — Ambulatory Visit
Admit: 2015-11-14 | Discharge: 2015-11-14 | Payer: PRIVATE HEALTH INSURANCE | Attending: Internal Medicine | Primary: Oncology

## 2015-11-14 DIAGNOSIS — Z7689 Persons encountering health services in other specified circumstances: Secondary | ICD-10-CM

## 2015-11-14 LAB — URINALYSIS
Bilirubin Urine: NEGATIVE
Blood, Urine: NEGATIVE
Glucose, Ur: >=1000 mg/dL — AB
Leukocyte Esterase, Urine: NEGATIVE
Nitrite, Urine: NEGATIVE
Specific Gravity, UA: 1.029 (ref 1.005–1.030)
Urobilinogen, Urine: 0.2 E.U./dL (ref <2.0)
pH, UA: 6 (ref 5.0–8.0)

## 2015-11-14 LAB — LIPID PANEL
Cholesterol, Total: 163 mg/dL (ref 0–199)
HDL: 32 mg/dL — ABNORMAL LOW (ref 40–60)
LDL Calculated: 102 mg/dL — ABNORMAL HIGH (ref <100)
Triglycerides: 146 mg/dL (ref 0–150)
VLDL Cholesterol Calculated: 29 mg/dL

## 2015-11-14 LAB — COMPREHENSIVE METABOLIC PANEL
ALT: 15 U/L (ref 10–40)
AST: 17 U/L (ref 15–37)
Albumin/Globulin Ratio: 1 — ABNORMAL LOW (ref 1.1–2.2)
Albumin: 4.1 g/dL (ref 3.4–5.0)
Alkaline Phosphatase: 159 U/L — ABNORMAL HIGH (ref 40–129)
Anion Gap: 15 (ref 3–16)
BUN: 7 mg/dL (ref 7–20)
CO2: 24 mmol/L (ref 21–32)
Calcium: 9.4 mg/dL (ref 8.3–10.6)
Chloride: 97 mmol/L — ABNORMAL LOW (ref 99–110)
Creatinine: 0.5 mg/dL — ABNORMAL LOW (ref 0.6–1.1)
GFR African American: >60 (ref >60)
GFR Non-African American: >60 (ref >60)
Globulin: 4 g/dL
Glucose: 318 mg/dL — ABNORMAL HIGH (ref 70–99)
Potassium: 4.4 mmol/L (ref 3.5–5.1)
Sodium: 136 mmol/L (ref 136–145)
Total Bilirubin: 0.5 mg/dL (ref 0.0–1.0)
Total Protein: 8.1 g/dL (ref 6.4–8.2)

## 2015-11-14 LAB — CBC
Hematocrit: 34 % — ABNORMAL LOW (ref 36.0–48.0)
Hemoglobin: 10.9 g/dL — ABNORMAL LOW (ref 12.0–16.0)
MCH: 26.5 pg (ref 26.0–34.0)
MCHC: 32.1 g/dL (ref 31.0–36.0)
MCV: 82.5 fL (ref 80.0–100.0)
MPV: 9.8 fL (ref 5.0–10.5)
Platelets: 406 10*3/uL (ref 135–450)
RBC: 4.12 M/uL (ref 4.00–5.20)
RDW: 17.4 % — ABNORMAL HIGH (ref 12.4–15.4)
WBC: 10.9 10*3/uL (ref 4.0–11.0)

## 2015-11-14 LAB — MICROSCOPIC URINALYSIS
Epithelial Cells, UA: 2 /HPF (ref 0–5)
Hyaline Casts, UA: 0 /LPF (ref 0–8)
RBC, UA: 3 /HPF (ref 0–4)
WBC, UA: 1 /HPF (ref 0–5)

## 2015-11-14 LAB — POCT GLUCOSE: Glucose: 360 mg/dL

## 2015-11-14 MED ORDER — SIMVASTATIN 40 MG PO TABS
40 | ORAL_TABLET | Freq: Every day | ORAL | 3 refills | Status: DC
Start: 2015-11-14 — End: 2016-07-29

## 2015-11-14 MED ORDER — ALBUTEROL SULFATE HFA 108 (90 BASE) MCG/ACT IN AERS
108 | Freq: Four times a day (QID) | RESPIRATORY_TRACT | 5 refills | 25.00000 days | Status: DC | PRN
Start: 2015-11-14 — End: 2019-02-10

## 2015-11-14 MED ORDER — METFORMIN HCL 500 MG PO TABS
500 | ORAL_TABLET | Freq: Two times a day (BID) | ORAL | 5 refills | Status: DC
Start: 2015-11-14 — End: 2015-12-14

## 2015-11-14 NOTE — Progress Notes (Signed)
 Subjective:      Patient ID: Shelly Richard is a 30 y.o. female.    Hypertension   This is a chronic problem. The problem is unchanged. The problem is controlled. There are no associated agents to hypertension. Risk factors for coronary artery disease include family history and obesity. Past treatments include lifestyle changes.   Hyperlipidemia   This is a chronic problem. The problem is uncontrolled. Recent lipid tests were reviewed and are variable. Exacerbating diseases include diabetes and obesity. Current antihyperlipidemic treatment includes diet change. The current treatment provides significant improvement of lipids. Compliance problems include adherence to exercise.    Diabetes   She presents for her follow-up diabetic visit. She has type 2 diabetes mellitus. Her disease course has been stable. There are no diabetic associated symptoms. Symptoms are improving.       Review of Systems   Constitutional: Negative.    HENT: Negative.    Eyes: Negative.    Respiratory: Negative.    Cardiovascular: Negative.    Gastrointestinal: Negative.    Genitourinary: Negative.    Musculoskeletal: Negative.    Skin: Negative.    Neurological: Negative.    Psychiatric/Behavioral: Negative.        Objective:   Physical Exam   Constitutional: She is oriented to person, place, and time. She appears well-developed and well-nourished. No distress.   HENT:   Head: Normocephalic and atraumatic.   Right Ear: External ear normal.   Left Ear: External ear normal.   Nose: Nose normal.   Mouth/Throat: Oropharynx is clear and moist.   Eyes: Conjunctivae and EOM are normal. Pupils are equal, round, and reactive to light. Left eye exhibits no discharge. No scleral icterus.   Neck: Normal range of motion. No JVD present. No thyromegaly present.   Cardiovascular: Normal rate, regular rhythm and normal heart sounds.  Exam reveals no gallop.    No murmur heard.  Pulmonary/Chest: Effort normal and breath sounds normal. No respiratory distress.  She has no wheezes. She has no rales.   Abdominal: Soft. Bowel sounds are normal. She exhibits no mass. There is no tenderness.   Musculoskeletal: Normal range of motion. She exhibits no edema.   Lymphadenopathy:     She has no cervical adenopathy.   Neurological: She is alert and oriented to person, place, and time. She has normal reflexes. No cranial nerve deficit.   Skin: Skin is warm and dry. No erythema.   Psychiatric: She has a normal mood and affect. Her behavior is normal. Judgment and thought content normal.       Assessment:     proteinuria by history    Angioedema with ACE inhibitors    Family history of diabetes   iron  deficiency anemia   Plan:     EKG    Labs    Will decide on meds after reviewing labs

## 2015-11-15 LAB — HIV SCREEN: HIV-1/HIV-2 Ab: NONREACTIVE

## 2015-11-15 LAB — HEMOGLOBIN A1C
Estimated Avg Glucose: 194.4 mg/dL
Hemoglobin A1C: 8.4 %

## 2015-11-15 LAB — POCT GLYCOSYLATED HEMOGLOBIN (HGB A1C): Hemoglobin A1C: 8.1 %

## 2015-12-12 ENCOUNTER — Emergency Department (HOSPITAL_BASED_OUTPATIENT_CLINIC_OR_DEPARTMENT_OTHER): Payer: Medicare Other

## 2015-12-12 ENCOUNTER — Emergency Department (HOSPITAL_BASED_OUTPATIENT_CLINIC_OR_DEPARTMENT_OTHER)
Admission: EM | Admit: 2015-12-12 | Discharge: 2015-12-12 | Disposition: A | Discharge status: Home / Self Care | Payer: Medicare Other | Attending: Emergency Medicine | Admitting: Emergency Medicine

## 2015-12-12 ENCOUNTER — Encounter (HOSPITAL_BASED_OUTPATIENT_CLINIC_OR_DEPARTMENT_OTHER): Payer: Self-pay | Admitting: *Deleted

## 2015-12-12 ENCOUNTER — Inpatient Hospital Stay: Admit: 2015-12-12 | Discharge: 2015-12-13 | Disposition: A | Discharge status: Home / Self Care

## 2015-12-12 ENCOUNTER — Encounter: Admit: 2015-12-13 | Primary: Oncology

## 2015-12-12 DIAGNOSIS — Z79899 Other long term (current) drug therapy: Secondary | ICD-10-CM | POA: Insufficient documentation

## 2015-12-12 DIAGNOSIS — M359 Systemic involvement of connective tissue, unspecified: Secondary | ICD-10-CM | POA: Insufficient documentation

## 2015-12-12 DIAGNOSIS — J90 Pleural effusion, not elsewhere classified: Secondary | ICD-10-CM | POA: Diagnosis not present

## 2015-12-12 DIAGNOSIS — J84112 Idiopathic pulmonary fibrosis: Secondary | ICD-10-CM | POA: Diagnosis not present

## 2015-12-12 DIAGNOSIS — R0602 Shortness of breath: Secondary | ICD-10-CM | POA: Diagnosis present

## 2015-12-12 DIAGNOSIS — S39012A Strain of muscle, fascia and tendon of lower back, initial encounter: Secondary | ICD-10-CM

## 2015-12-12 LAB — CBC WITH DIFFERENTIAL/PLATELET
BAND NEUTROPHILS: 2 %
BASOS PCT: 0 %
Basophils Absolute: 0 10*3/uL (ref 0.0–0.1)
EOS ABS: 0 10*3/uL (ref 0.0–0.7)
EOS PCT: 0 %
HEMATOCRIT: 26.2 % — AB (ref 36.0–46.0)
HEMOGLOBIN: 7.2 g/dL — AB (ref 12.0–15.0)
LYMPHS PCT: 9 %
Lymphs Abs: 0.7 10*3/uL (ref 0.7–4.0)
MCH: 23.8 pg — AB (ref 26.0–34.0)
MCHC: 27.5 g/dL — AB (ref 30.0–36.0)
MCV: 86.8 fL (ref 78.0–100.0)
MONOS PCT: 6 %
Monocytes Absolute: 0.5 10*3/uL (ref 0.1–1.0)
NEUTROS ABS: 7.1 10*3/uL (ref 1.7–7.7)
Neutrophils Relative %: 83 %
Platelets: 335 10*3/uL (ref 150–400)
RBC: 3.02 MIL/uL — ABNORMAL LOW (ref 3.87–5.11)
RDW: 17.9 % — AB (ref 11.5–15.5)
WBC: 8.3 10*3/uL (ref 4.0–10.5)

## 2015-12-12 LAB — BASIC METABOLIC PANEL
Anion gap: 7 (ref 5–15)
BUN: 10 mg/dL (ref 6–20)
CALCIUM: 8.4 mg/dL — AB (ref 8.9–10.3)
CHLORIDE: 103 mmol/L (ref 101–111)
CO2: 24 mmol/L (ref 22–32)
CREATININE: 0.35 mg/dL — AB (ref 0.44–1.00)
GFR calc Af Amer: >60 mL/min (ref >60)
GFR calc non Af Amer: >60 mL/min (ref >60)
GLUCOSE: 97 mg/dL (ref 65–99)
Potassium: 3.3 mmol/L — ABNORMAL LOW (ref 3.5–5.1)
Sodium: 134 mmol/L — ABNORMAL LOW (ref 135–145)

## 2015-12-12 MED ORDER — FENTANYL CITRATE (PF) 100 MCG/2ML IJ SOLN
50.0000 ug | INTRAMUSCULAR | Status: DC | PRN
  Administered 2015-12-12: 50 ug via INTRAVENOUS
  Filled 2015-12-12: qty 2

## 2015-12-12 MED ORDER — KETOROLAC TROMETHAMINE 30 MG/ML IJ SOLN
30.0000 mg | Freq: Once | INTRAMUSCULAR | Status: AC
  Administered 2015-12-12: 30 mg via INTRAVENOUS
  Filled 2015-12-12: qty 1

## 2015-12-12 MED ORDER — IOPAMIDOL (ISOVUE-300) INJECTION 61%
100.0000 mL | Freq: Once | INTRAVENOUS | Status: AC | PRN
  Administered 2015-12-12: 80 mL via INTRAVENOUS
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
## 2015-12-12 NOTE — ED Notes (Signed)
 Pt discharge to home in stable condition with family via private car.  Discharge instructions and prescriptions reviewed with patient and family members.   Patient and family verbalized understanding.   Patient advised not to drive, drink ETOH or operate machinery while taking pain medications at home.  Patient and family verbalized understanding.   All belongings in tow including discharge paperwork.  Pt offered wheelchair and declined. Patient ambulatory at time of D/C                       Lauraine Ingle, RN  12/12/15 2116
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
## 2015-12-12 NOTE — ED Notes (Signed)
 Patient medicated per MAR. Updated on plan for XRay denies any further needs at this time.      Lauraine Ingle, RN  12/12/15 2013

## 2015-12-12 NOTE — ED Notes (Signed)
MD at bedside. 

## 2015-12-12 NOTE — ED Triage Notes (Signed)
States she was treated for bilateral pneumonia 7 days ago. She feels like she has pleurisy. Pain on the right side of her chest.

## 2015-12-12 NOTE — ED Notes (Signed)
MD at bedside discussing test results and dispo plan of care. 

## 2015-12-12 NOTE — ED Provider Notes (Signed)
[facility] Provider Note   CSN: [id number] Arrival date & time: 12/12/15  1913 By signing my name below, I, Bridgette Habermann, attest that this documentation has been prepared under the direction and in the presence of Lyndal Pulley, MD. Electronically Signed: Bridgette Habermann, ED Scribe. 12/12/15. 8:34 PM.  History   Chief Complaint Chief Complaint  Patient presents with  . Shortness of Breath   HPI Comments: Jeanne Hawkins is a 30 y.o. female with h/o mixed connective tissue disease and anemia who presents to the Emergency Department complaining of sudden onset, constant shortness of breath onset 2 days ago. Pt also has associated right-sided chest pain. Pt has taken Ibuprofen and Prednisone with moderate temporary relief to the pain. Pt states she was treated for bilateral pneumonia one week ago in [place]. Pt has h/o pleurisy and states her symptoms at this time feel similar. No recent long travel. Denies h/o blood clots. Pt denies leg swelling, fever, or any other associated symptoms.   The history is provided by the patient. No language interpreter was used.    Past Medical History:  Diagnosis Date  . Anemia   . Arthritis, rheumatoid (HCC)   . Esophagitis   . Mixed connective tissue disease (HCC)   . Polymyositis (HCC)   . Pulmonary hypertension (HCC)   . RA (rheumatoid arthritis) (HCC)     There are no active problems to display for this patient.   History reviewed. No pertinent surgical history.  OB History    No data available       Home Medications    Prior to Admission medications   Medication Sig Start Date End Date Taking? Authorizing Provider  ambrisentan (LETAIRIS) 10 MG tablet Take 10 mg by mouth daily.   Yes Historical Provider, MD  Belimumab (BENLYSTA IV) Inject into the vein.   Yes Historical Provider, MD  dapsone 25 MG tablet Take 25 mg by mouth daily.   Yes Historical Provider, MD  hydroxychloroquine (PLAQUENIL) 200 MG tablet Take 200 mg by  mouth 2 (two) times daily.   Yes Historical Provider, MD  ibuprofen (ADVIL,MOTRIN) 800 MG tablet Take 800 mg by mouth 2 (two) times daily.   Yes Historical Provider, MD  mycophenolate (MYFORTIC) 360 MG TBEC Take 500 mg by mouth 2 (two) times daily.   Yes Historical Provider, MD  omeprazole (PRILOSEC) 20 MG capsule Take 1 capsule (20 mg total) by mouth daily. 12/18/10 12/12/15 Yes Glynn Octave, MD  predniSONE (DELTASONE) 10 MG tablet Take 15 mg by mouth daily.     Yes Historical Provider, MD  azithromycin (ZITHROMAX Z-PAK) 250 MG tablet 2 po day one, then 1 daily x 4 days 04/29/14   Geoffery Lyons, MD  chlorpheniramine-HYDROcodone [facility]) 10-8 MG/5ML LQCR Take 5 mLs by mouth every 12 (twelve) hours as needed for cough. 04/29/14   Geoffery Lyons, MD  lisinopril (PRINIVIL,ZESTRIL) 5 MG tablet Take 5 mg by mouth daily.    Historical Provider, MD    Family History No family history on file.  Social History Social History  Substance Use Topics  . Smoking status: Never Smoker  . Smokeless tobacco: Never Used  . Alcohol use No     Allergies   Tramadol   Review of Systems Review of Systems  Constitutional: Negative for fever.  Respiratory: Positive for shortness of breath.   Cardiovascular: Positive for chest pain. Negative for leg swelling.  All other systems reviewed and are negative.    Physical Exam Updated  Vital Signs BP 118/84   Pulse 108   Temp 98.3 F (36.8 C) (Oral)   Resp 20   Ht 5\' 7"  (1.702 m)   Wt 127 lb (57.6 kg)   LMP 11/25/2015   SpO2 100%   BMI 19.89 kg/m   Physical Exam  Constitutional: She appears well-developed and well-nourished.  HENT:  Head: Normocephalic.  Eyes: Conjunctivae are normal.  Cardiovascular: Normal rate, regular rhythm and normal heart sounds.  Exam reveals no gallop and no friction rub.   No murmur heard. Pulmonary/Chest: Effort normal. No respiratory distress. She has no wheezes. She has rales.  Bilateral diffuse  inspiratory rales  Abdominal: She exhibits no distension.  Musculoskeletal: Normal range of motion.  Neurological: She is alert.  Skin: Skin is warm and dry.  Psychiatric: She has a normal mood and affect. Her behavior is normal.  Nursing note and vitals reviewed.  ED Treatments / Results  DIAGNOSTIC STUDIES: Oxygen Saturation is 100% on RA, normal by my interpretation.    COORDINATION OF CARE: 7:47 PM Discussed treatment plan with pt at bedside and pt agreed to plan.  Labs (all labs ordered are listed, but only abnormal results are displayed) Labs Reviewed  CBC WITH DIFFERENTIAL/PLATELET - Abnormal; Notable for the following:       Result Value   RBC 3.02 (*)    Hemoglobin 7.2 (*)    HCT 26.2 (*)    MCH 23.8 (*)    MCHC 27.5 (*)    RDW 17.9 (*)    All other components within normal limits  BASIC METABOLIC PANEL - Abnormal; Notable for the following:    Sodium 134 (*)    Potassium 3.3 (*)    Creatinine, Ser 0.35 (*)    Calcium 8.4 (*)    All other components within normal limits    EKG  EKG Interpretation None       Radiology Dg Chest 2 View  Result Date: 12/12/2015 CLINICAL DATA:  Right flank pain today, with pleuritic component. Finished antibiotics today for a treatment of pneumonia. EXAM: CHEST  2 VIEW COMPARISON:  02/26/2014 FINDINGS: Interstitial and alveolar opacities are present in both bases, some of which is chronic and unchanged. However, there is more confluent opacity in the lateral right base which could represent a superimposed pneumonia. Mild chronic appearing blunting of the right lateral costophrenic angle. No significant posterior costophrenic angle fluid. IMPRESSION: Question right lateral base airspace opacity superimposed on moderately severe chronic lung base opacities. This could represent pneumonia or aspiration. Electronically Signed   By: 02/28/2014 M.D.   On: 12/12/2015 21:57   Ct Chest W Contrast  Result Date: 12/12/2015 CLINICAL  DATA:  RIGHT-sided chest known and difficulty breathing for 2 days. Diagnosed with pneumonia 1 week ago. History of mixed connective tissue disease, pulmonary hypertension. EXAM: CT CHEST WITH CONTRAST TECHNIQUE: Multidetector CT imaging of the chest was performed during intravenous contrast administration. CONTRAST:  81mL ISOVUE-300 IOPAMIDOL (ISOVUE-300) INJECTION 61% COMPARISON:  Chest radiograph December 12, 2015 at FINDINGS: MEDIASTINUM: Heart and pericardium are unremarkable. Thoracic aorta is normal course and caliber, unremarkable. Greater than expected number of non pathologically enlarged mediastinal lymph nodes. Main pulmonary artery is top-normal in size at 2.9 cm. LUNGS: Tracheobronchial tree is patent, no pneumothorax. Bibasilar fibrotic changes, patchy ground-glass opacities, and mild bronchiectasis interlobular septal thickening. No honeycombing. Generalized mild pleural cysts scarring and thickening. SOFT TISSUES AND OSSEOUS STRUCTURES: Included view of the abdomen is nonacute; at least 3 sub cm gallstones  present. Visualized soft tissues and included osseous structures appear normal. IMPRESSION: Bibasilar fibrotic changes without honeycombing, possible UIP. Small RIGHT pleural effusion without focal consolidation. Cholelithiasis without acute cholecystitis. Electronically Signed   By: Awilda Metro M.D.   On: 12/12/2015 23:16    Procedures Procedures (including critical care time)  Medications Ordered in ED Medications  fentaNYL (SUBLIMAZE) injection 50 mcg (50 mcg Intravenous Given 12/12/15 2221)  iopamidol (ISOVUE-300) 61 % injection 100 mL (80 mLs Intravenous Contrast Given 12/12/15 2250)  ketorolac (TORADOL) 30 MG/ML injection 30 mg (30 mg Intravenous Given 12/12/15 2344)     Initial Impression / Assessment and Plan / ED Course  I have reviewed the triage vital signs and the nursing notes.  Pertinent labs & imaging results that were available during my care of the patient were  reviewed by me and considered in my medical decision making (see chart for details).  Clinical Course    30 y.o. female presents with Right-sided chest pain after being recently diagnosed with pneumonia 7 days ago and completing a course of Levaquin today. She is immunosuppressed on CellCept for mixed connective tissue disease. She states her last hemoglobin was 7.50 week ago and it is stable today. She has a persistent opacification of the right hemidiaphragm which appears obscured concerning for possible empyema given her recently treated infection. CT of the chest was ordered to further elucidate the cause.  Small pleural effusion is evident without focal areas of consolidation or other typical features of pneumonia. I suspect at this time that her small effusion is reactive or inflammatory in nature rather than relating to an acute infection. I did not recommend extending her antibiotics at this time as she has no fever, productive cough, or other typical signs of pneumonia currently. She is saturating well on room air and has no indication for further inpatient stabilization or treatment. She was given pain medicine and anti-inflammatories here for her symptoms.  Given her progressive features and ongoing pain I recommended that she follow-up closely with her pulmonologist who is at [facility] to discuss the findings on today's CT and determine an appropriate course of action to proceed. Plan to follow up with PCP as needed and return precautions discussed for worsening or new concerning symptoms.   Final Clinical Impressions(s) / ED Diagnoses   Final diagnoses:  Pleural effusion on right  UIP (usual interstitial pneumonitis) (HCC)  Connective tissue disease (HCC)    New Prescriptions New Prescriptions   No medications on file  I personally performed the services described in this documentation, which was scribed in my presence. The recorded information has been reviewed and is accurate.        Lyndal Pulley, MD 12/12/15 [phone]

## 2015-12-13 MED ORDER — CYCLOBENZAPRINE HCL 10 MG PO TABS
10 | ORAL_TABLET | Freq: Three times a day (TID) | ORAL | 0 refills | 30.00000 days | Status: DC | PRN
Start: 2015-12-13 — End: 2015-12-14

## 2015-12-13 MED ORDER — ORPHENADRINE CITRATE 30 MG/ML IJ SOLN
30 MG/ML | Freq: Once | INTRAMUSCULAR | Status: AC
Start: 2015-12-13 — End: 2015-12-12
  Administered 2015-12-13: 60 mg via INTRAMUSCULAR

## 2015-12-13 MED ORDER — IBUPROFEN 600 MG PO TABS
600 | ORAL_TABLET | Freq: Four times a day (QID) | ORAL | 0 refills | 20.00000 days | Status: DC | PRN
Start: 2015-12-13 — End: 2015-12-14

## 2015-12-13 MED ORDER — KETOROLAC TROMETHAMINE 60 MG/2ML IJ SOLN
60 MG/2ML | Freq: Once | INTRAMUSCULAR | Status: AC
Start: 2015-12-13 — End: 2015-12-12
  Administered 2015-12-13: 60 mg via INTRAMUSCULAR

## 2015-12-13 MED FILL — ORPHENADRINE CITRATE 30 MG/ML IJ SOLN: 30 mg/mL | INTRAMUSCULAR | Qty: 2

## 2015-12-13 MED FILL — KETOROLAC TROMETHAMINE 60 MG/2ML IJ SOLN: 60 MG/2ML | INTRAMUSCULAR | Qty: 2

## 2015-12-14 ENCOUNTER — Ambulatory Visit
Admit: 2015-12-14 | Discharge: 2015-12-14 | Payer: PRIVATE HEALTH INSURANCE | Attending: Internal Medicine | Primary: Oncology

## 2015-12-14 DIAGNOSIS — E782 Mixed hyperlipidemia: Secondary | ICD-10-CM

## 2015-12-14 LAB — POCT GLUCOSE: Glucose: 362 mg/dL

## 2015-12-14 MED ORDER — HYDROCODONE-ACETAMINOPHEN 5-325 MG PO TABS
5-325 | ORAL_TABLET | Freq: Four times a day (QID) | ORAL | 0 refills | 30.00000 days | Status: AC | PRN
Start: 2015-12-14 — End: 2015-12-21

## 2015-12-14 MED ORDER — IBUPROFEN 600 MG PO TABS
600 | ORAL_TABLET | Freq: Four times a day (QID) | ORAL | 2 refills | 20.00000 days | Status: DC | PRN
Start: 2015-12-14 — End: 2022-02-01

## 2015-12-14 MED ORDER — METHOCARBAMOL 500 MG PO TABS
500 | ORAL_TABLET | Freq: Four times a day (QID) | ORAL | 0 refills | 15.00000 days | Status: AC
Start: 2015-12-14 — End: 2015-12-24

## 2015-12-14 MED ORDER — CYCLOBENZAPRINE HCL 10 MG PO TABS
10 | ORAL_TABLET | Freq: Three times a day (TID) | ORAL | 2 refills | 30.00000 days | Status: AC | PRN
Start: 2015-12-14 — End: 2015-12-24
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
## 2015-12-19 NOTE — Telephone Encounter (Signed)
 Patient is requesting letter be faxed to her job to return to work today patient was seen last week and was given letter to return on wednesday and it was suppose to say  today please fax to [phone].

## 2015-12-19 NOTE — Telephone Encounter (Signed)
Spoke to patient letter faxed to patient job

## 2016-01-01 ENCOUNTER — Ambulatory Visit
Admit: 2016-01-01 | Discharge: 2016-02-07 | Payer: PRIVATE HEALTH INSURANCE | Attending: Internal Medicine | Primary: Oncology

## 2016-01-01 DIAGNOSIS — R519 Headache, unspecified: Secondary | ICD-10-CM

## 2016-01-01 MED ORDER — KETOROLAC TROMETHAMINE 30 MG/ML IM SOLN
30 MG/ML | Freq: Once | INTRAMUSCULAR | Status: AC | PRN
Start: 2016-01-01 — End: 2016-01-01
  Administered 2016-01-01: 21:00:00 30 mg via INTRAMUSCULAR
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
## 2016-01-08 ENCOUNTER — Ambulatory Visit
Admit: 2016-01-08 | Discharge: 2016-01-08 | Payer: PRIVATE HEALTH INSURANCE | Attending: Internal Medicine | Primary: Oncology

## 2016-01-08 DIAGNOSIS — L299 Pruritus, unspecified: Secondary | ICD-10-CM
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
## 2016-01-10 LAB — COMMON FOOD ALLERGEN PROFILE
Clams IgE: 0.12 kU/L (ref <=0.34)
Codfish IgE: <0.1 kU/L (ref <=0.34)
Corn IgE: 0.13 kU/L (ref <=0.34)
Egg White IgE: <0.1 kU/L (ref <=0.34)
Scallop IgE: <0.1 kU/L (ref <=0.34)
Shrimp IgE: 0.19 kU/L (ref <=0.34)
Soybean IgE: 1.07 kU/L — ABNORMAL HIGH (ref <=0.34)
Walnut IgE: 0.62 kU/L — ABNORMAL HIGH (ref <=0.34)
Wheat IgE: 0.14 kU/L (ref <=0.34)

## 2016-01-10 LAB — RESPIRATORY ALLERGEN PROFILE
ALLERGEN PENICILLIUM NOTATUM: <0.1 kU/L (ref <=0.34)
Allergen Birch IgE: <0.1 kU/L (ref <=0.34)
Allergen Fungi/Mold, M. racemosus IGE: <0.1 kU/L (ref <=0.34)
Allergen Mountain Cedar: <0.1 kU/L (ref <=0.34)
Allergen Mouse Epithelial: <0.1 kU/L (ref <=0.34)
Allergen Tree Sycamore: <0.1 kU/L (ref <=0.34)
Allergen White Mulberry Tree, IGE: <0.1 kU/L (ref <=0.34)
Allergen, Tree, White Ash IgE: <0.1 kU/L (ref <=0.34)
Aspergillus alternata IgE: <0.1 kU/L (ref <=0.34)
Aspergillus fumigatus IgE: <0.1 kU/L (ref <=0.34)
Bermuda Grass IgE: <0.1 kU/L (ref <=0.34)
Box Elder IgE: <0.1 kU/L (ref <=0.34)
Cat Dander IgE: 0.16 kU/L (ref <=0.34)
Common-Short Ragweed IgE: <0.1 kU/L (ref <=0.34)
Cottonwood IgE: <0.1 kU/L (ref <=0.34)
D. farinae IgE: 0.17 kU/L (ref <=0.34)
Dog Dander IgE: 0.46 kU/L — ABNORMAL HIGH (ref <=0.34)
Elm IgE: 0.45 kU/L — ABNORMAL HIGH (ref <=0.34)
German Cockroach IgE: 0.22 kU/L (ref <=0.34)
Hormodendrum Hordei IgE: <0.1 kU/L (ref <=0.34)
IgE: 551 kU/L — ABNORMAL HIGH (ref <=214)
Milk, Cow's IgE: <0.1 kU/L (ref <=0.34)
Mite Dust Pteronyssinus IgE: 0.29 kU/L (ref <=0.34)
Oak Tree IgE: <0.1 kU/L (ref <=0.34)
Peanut IgE: 73.7 kU/L — ABNORMAL HIGH (ref <=0.34)
Pecan Tree IgE: <0.1 kU/L (ref <=0.34)
Rough Pigweed  IgE: <0.1 kU/L (ref <=0.34)
Russian Thistle IgE: <0.1 kU/L (ref <=0.34)
Sheep Sorrel IgE: <0.1 kU/L (ref <=0.34)
Timothy Grass, IgE: <0.1 kU/L (ref <=0.34)
Walnut Tree IgE: <0.1 kU/L (ref <=0.34)

## 2016-02-23 ENCOUNTER — Ambulatory Visit
Admit: 2016-02-23 | Discharge: 2016-02-23 | Payer: PRIVATE HEALTH INSURANCE | Attending: Family Health | Primary: Oncology

## 2016-02-23 DIAGNOSIS — K2970 Gastritis, unspecified, without bleeding: Secondary | ICD-10-CM

## 2016-02-23 NOTE — Patient Instructions (Addendum)
 Keep diet light    Small frequent sips and snacks    Advance to [employer] (banannas, rice, apple sauce and toast)    Avoid spicy and greasy foods    Watch for signs and symptoms of dehydration    To the ED if abdominal pain, fever, inability to hold down food and fluid    If diarrhea persist longer then 5-7 days, follow up with PCP for possible stools cultures

## 2016-02-23 NOTE — Progress Notes (Signed)
 SUBJECTIVE:    Patient ID:  Shelly Richard is a 30 y.o. female      Patient is here for an acute visit for complaints of nausea, vomiting and and diarrhea for 2-3 days.          The primary symptoms include nausea, vomiting and diarrhea. Primary symptoms do not include fever, abdominal pain, dysuria, myalgias, arthralgias or rash.   The illness is also significant for chills and anorexia (slight). Associated medical issues do not include inflammatory bowel disease, bowel resection or irritable bowel syndrome.   Nausea & Vomiting   This is a new problem. Episode onset: 2-3 days. Episode frequency: 5-6 times yesterday. The problem has been gradually improving. Associated symptoms include anorexia (slight), a change in bowel habit, chills, nausea and vomiting. Pertinent negatives include no abdominal pain, arthralgias, congestion, coughing, diaphoresis, fever, headaches, myalgias, neck pain, rash, sore throat, swollen glands or urinary symptoms. Nothing aggravates the symptoms. She has tried NSAIDs for the symptoms.   Diarrhea    This is a new problem. Episode onset: yeaterday. The problem occurs more than 10 times per day. The problem has been gradually improving. The stool consistency is described as watery. The patient states that diarrhea does not awaken her from sleep. Associated symptoms include chills and vomiting. Pertinent negatives include no abdominal pain, arthralgias, coughing, fever, headaches, myalgias or sweats. Nothing aggravates the symptoms. She has tried nothing for the symptoms. There is no history of bowel resection, inflammatory bowel disease, irritable bowel syndrome, a recent abdominal surgery or short gut syndrome.     Past Medical History:   Diagnosis Date    Asthma     Diabetes mellitus (HCC)     Hyperlipidemia      Past Surgical History:   Procedure Laterality Date    CYST REMOVAL       History reviewed. No pertinent family history.  Social History     Social History    Marital  status: Single     Spouse name: N/A    Number of children: N/A    Years of education: N/A     Occupational History    Not on file.     Social History Main Topics    Smoking status: Never Smoker    Smokeless tobacco: Never Used    Alcohol use No    Drug use: No    Sexual activity: Yes     Partners: Male     Other Topics Concern    Not on file     Social History Narrative    No narrative on file       Review of Systems   Constitutional: Positive for chills. Negative for diaphoresis and fever.   HENT: Negative for congestion and sore throat.    Respiratory: Negative for cough, chest tightness and stridor.    Cardiovascular: Negative for palpitations.   Gastrointestinal: Positive for anorexia (slight), change in bowel habit, diarrhea, nausea and vomiting. Negative for abdominal pain.   Genitourinary: Negative for dysuria, frequency and urgency.   Musculoskeletal: Negative for arthralgias, myalgias and neck pain.   Skin: Negative for rash.   Neurological: Negative for headaches.       OBJECTIVE:  Physical Exam   Constitutional: She is oriented to person, place, and time. She appears well-developed and well-nourished. No distress.   HENT:   Head: Normocephalic and atraumatic.   Eyes: Conjunctivae are normal. Pupils are equal, round, and reactive to light.   Neck: Normal range of  motion. Neck supple. No tracheal deviation present.   Cardiovascular: Normal rate, regular rhythm and normal heart sounds.    Pulmonary/Chest: Effort normal and breath sounds normal. No respiratory distress. She has no wheezes. She has no rales. She exhibits no tenderness.   Abdominal: Soft. Bowel sounds are normal. She exhibits no distension and no mass. There is no tenderness. There is no rebound and no guarding.   Musculoskeletal: Normal range of motion. She exhibits no edema.   Neurological: She is alert and oriented to person, place, and time.   Skin: Skin is warm and dry. No rash noted.   Psychiatric: She has a normal mood and  affect. Her behavior is normal.   Nursing note and vitals reviewed.    BP 112/68   Pulse 112   Temp 98.4 F (36.9 C)   Resp 17   Ht 5' 4 (1.626 m)   Wt 204 lb 3.2 oz (92.6 kg)   SpO2 99%   BMI 35.05 kg/m    BP Readings from Last 3 Encounters:   02/23/16 112/68   01/08/16 130/70   01/01/16 104/68      Wt Readings from Last 3 Encounters:   02/23/16 204 lb 3.2 oz (92.6 kg)   01/08/16 208 lb (94.3 kg)   01/01/16 207 lb (93.9 kg)       ASSESSMENT & PLAN:    1. Viral gastritis  - Keep diet light  - Small frequent sips and snacks  - Advance to [employer] (banannas, rice, apple sauce and toast)  - Avoid spicy and greasy foods  - Watch for signs and symptoms of dehydration  - To the ED if abdominal pain, fever, inability to hold down food and fluid  - If diarrhea persist longer then 5-7 days, follow up with PCP for possible stools cultures      Continue current treatment plan.    Return if symptoms worsen or fail to improve, for viral gastritis .    Call office if experience side effects from medications.

## 2016-03-11 ENCOUNTER — Encounter: Attending: Internal Medicine | Primary: Oncology

## 2016-04-24 ENCOUNTER — Ambulatory Visit
Admit: 2016-04-24 | Discharge: 2016-04-24 | Payer: PRIVATE HEALTH INSURANCE | Attending: Internal Medicine | Primary: Oncology

## 2016-04-24 DIAGNOSIS — Z833 Family history of diabetes mellitus: Secondary | ICD-10-CM

## 2016-04-24 LAB — COMPREHENSIVE METABOLIC PANEL
ALT: 11 U/L (ref 10–40)
AST: 13 U/L — ABNORMAL LOW (ref 15–37)
Albumin/Globulin Ratio: 1.1 (ref 1.1–2.2)
Albumin: 4 g/dL (ref 3.4–5.0)
Alkaline Phosphatase: 125 U/L (ref 40–129)
Anion Gap: 16 (ref 3–16)
BUN: 12 mg/dL (ref 7–20)
CO2: 23 mmol/L (ref 21–32)
Calcium: 9.6 mg/dL (ref 8.3–10.6)
Chloride: 99 mmol/L (ref 99–110)
Creatinine: 0.7 mg/dL (ref 0.6–1.1)
GFR African American: >60 (ref >60)
GFR Non-African American: >60 (ref >60)
Globulin: 3.5 g/dL
Glucose: 289 mg/dL — ABNORMAL HIGH (ref 70–99)
Potassium: 4.6 mmol/L (ref 3.5–5.1)
Sodium: 138 mmol/L (ref 136–145)
Total Bilirubin: 0.4 mg/dL (ref 0.0–1.0)
Total Protein: 7.5 g/dL (ref 6.4–8.2)

## 2016-04-24 LAB — LIPID PANEL
Cholesterol, Total: 168 mg/dL (ref 0–199)
HDL: 31 mg/dL — ABNORMAL LOW (ref 40–60)
LDL Calculated: 95 mg/dL (ref <100)
Triglycerides: 212 mg/dL — ABNORMAL HIGH (ref 0–150)
VLDL Cholesterol Calculated: 42 mg/dL

## 2016-04-24 LAB — POCT GLUCOSE: Glucose: 355 mg/dL

## 2016-04-24 MED ORDER — METFORMIN HCL ER (OSM) 500 MG PO TB24
500 | ORAL_TABLET | Freq: Every day | ORAL | 3 refills | Status: DC
Start: 2016-04-24 — End: 2016-05-01

## 2016-04-24 NOTE — Progress Notes (Signed)
 Subjective:      Patient ID: Shelly Richard is a 31 y.o. female.    Diabetes   She presents for her follow-up diabetic visit. She has type 2 diabetes mellitus. Her disease course has been worsening. There are no hypoglycemic associated symptoms. There are no diabetic associated symptoms. There are no hypoglycemic complications. Symptoms are worsening. There are no diabetic complications. Risk factors for coronary artery disease include family history and obesity. Current diabetic treatment includes oral agent (monotherapy). She is compliant with treatment all of the time. There is no change in her home blood glucose trend.       Review of Systems   Constitutional: Negative.    HENT: Negative.    Eyes: Negative.    Respiratory: Negative.    Cardiovascular: Negative.    Gastrointestinal: Negative.    Genitourinary: Negative.    Musculoskeletal: Negative.    Skin: Negative.    Neurological: Negative.    Psychiatric/Behavioral: Negative.        Objective:   Physical Exam   Constitutional: She is oriented to person, place, and time. She appears well-developed and well-nourished. No distress.   HENT:   Head: Normocephalic and atraumatic.   Right Ear: External ear normal.   Left Ear: External ear normal.   Nose: Nose normal.   Mouth/Throat: Oropharynx is clear and moist.   Eyes: Conjunctivae and EOM are normal. Pupils are equal, round, and reactive to light. Left eye exhibits no discharge. No scleral icterus.   Neck: Normal range of motion. No JVD present. No thyromegaly present.   Cardiovascular: Normal rate, regular rhythm and normal heart sounds.  Exam reveals no gallop.    No murmur heard.  Pulmonary/Chest: Effort normal and breath sounds normal. No respiratory distress. She has no wheezes. She has no rales.   Abdominal: Soft. Bowel sounds are normal. She exhibits no mass. There is no tenderness.   Musculoskeletal: Normal range of motion. She exhibits no edema.   Lymphadenopathy:     She has no cervical adenopathy.    Neurological: She is alert and oriented to person, place, and time. She has normal reflexes. No cranial nerve deficit.   Skin: Skin is warm and dry. No erythema.   Psychiatric: She has a normal mood and affect. Her behavior is normal. Judgment and thought content normal.       Assessment:     diabetes too high    hyperlipidemia on meds    Hypertension  stable     Hypothyroidism on synthroid  0.05  Plan:    continue metformin    sample of xultrophy   16 units    labs    Return in 5 days. Tuesday

## 2016-04-25 LAB — HEMOGLOBIN A1C
Estimated Avg Glucose: 248.9 mg/dL
Hemoglobin A1C: 10.3 %

## 2016-04-26 MED ORDER — INSULIN DEGLUDEC-LIRAGLUTIDE 100-3.6 UNIT-MG/ML SC SOPN
100-3.6 | PEN_INJECTOR | SUBCUTANEOUS | 0 refills | 75.00000 days | Status: DC
Start: 2016-04-26 — End: 2016-06-12

## 2016-05-01 ENCOUNTER — Ambulatory Visit
Admit: 2016-05-01 | Discharge: 2016-05-01 | Payer: PRIVATE HEALTH INSURANCE | Attending: Registered Nurse | Primary: Oncology

## 2016-05-01 DIAGNOSIS — E119 Type 2 diabetes mellitus without complications: Secondary | ICD-10-CM

## 2016-05-01 LAB — POCT GLUCOSE: Glucose: 277 mg/dL

## 2016-05-01 MED ORDER — METFORMIN HCL ER (OSM) 500 MG PO TB24
500 | ORAL_TABLET | Freq: Every day | ORAL | 3 refills | Status: DC
Start: 2016-05-01 — End: 2016-09-19

## 2016-05-01 MED ORDER — INSULIN DEGLUDEC-LIRAGLUTIDE 100-3.6 UNIT-MG/ML SC SOPN
100-3.6 | PEN_INJECTOR | Freq: Every evening | SUBCUTANEOUS | 0 refills | 75.00000 days | Status: DC
Start: 2016-05-01 — End: 2016-06-12

## 2016-05-01 NOTE — Patient Instructions (Addendum)
 Dash Diet  Diet and Exercise.  Drink 64 ounces of water  daily.  Eat 2-3 servings of fruits and vegetables daily.  Take all medications as prescribed.   Xultophy  insulin  20 units daily.   Metformin  1000 mg daily.   Call 911 or go to the nearest emergency room for chest pain or shortness of breath.   Return in 1 week f/u Type 2 DM.      Patient Education        Learning About Meal Planning for Diabetes  Why plan your meals?  Meal planning can be a key part of managing diabetes. Planning meals and snacks with the right balance of carbohydrate, protein, and fat can help you keep your blood sugar at the target level you set with your doctor.  You don't have to eat special foods. You can eat what your family eats, including sweets once in a while. But you do have to pay attention to how often you eat and how much you eat of certain foods.  You may want to work with a dietitian or a certified diabetes educator. He or she can give you tips and meal ideas and can answer your questions about meal planning. This health professional can also help you reach a healthy weight if that is one of your goals.  What plan is right for you?  Your dietitian or diabetes educator may suggest that you start with the plate format or carbohydrate counting.  The plate format  The plate format is a simple way to help you manage how you eat. You plan meals by learning how much space each food should take on a plate. Using the plate format helps you spread carbohydrate throughout the day. It can make it easier to keep your blood sugar level within your target range. It also helps you see if you're eating healthy portion sizes.  To use the plate format, you put non-starchy vegetables on half your plate. Add meat or meat substitutes on one-quarter of the plate. Put a grain or starchy vegetable (such as brown rice or a potato) on the final quarter of the plate. You can add a small piece of fruit and some low-fat or fat-free milk or yogurt, depending  on your carbohydrate goal for each meal.  Here are some tips for using the plate format:   Make sure that you are not using an oversized plate. A 9-inch plate is best. Many restaurants use larger plates.   Get used to using the plate format at home. Then you can use it when you eat out.   Write down your questions about using the plate format. Talk to your doctor, a dietitian, or a diabetes educator about your concerns.  Carbohydrate counting  With carbohydrate counting, you plan meals based on the amount of carbohydrate in each food. Carbohydrate raises blood sugar higher and more quickly than any other nutrient. It is found in desserts, breads and cereals, and fruit. It's also found in starchy vegetables such as potatoes and corn, grains such as rice and pasta, and milk and yogurt. Spreading carbohydrate throughout the day helps keep your blood sugar levels within your target range.  Your daily amount depends on several things, including your weight, how active you are, which diabetes medicines you take, and what your goals are for your blood sugar levels. A registered dietitian or diabetes educator can help you plan how much carbohydrate to include in each meal and snack.  A guideline for your daily amount  of carbohydrate is:   45 to 60 grams at each meal. That's about the same as 3 to 4 carbohydrate servings.   15 to 20 grams at each snack. That's about the same as 1 carbohydrate serving.  The Nutrition Facts label on packaged foods tells you how much carbohydrate is in a serving of the food. First, look at the serving size on the food label. Is that the amount you eat in a serving? All of the nutrition information on a food label is based on that serving size. So if you eat more or less than that, you'll need to adjust the other numbers. Total carbohydrate is the next thing you need to look for on the label. If you count carbohydrate servings, one serving of carbohydrate is 15 grams.  For foods that don't  come with labels, such as fresh fruits and vegetables, you'll need a guide that lists carbohydrate in these foods. Ask your doctor, dietitian, or diabetes educator about books or other nutrition guides you can use.  If you take insulin , you need to know how many grams of carbohydrate are in a meal. This lets you know how much rapid-acting insulin  to take before you eat. If you use an insulin  pump, you get a constant rate of insulin  during the day. So the pump must be programmed at meals to give you extra insulin  to cover the rise in blood sugar after meals.  When you know how much carbohydrate you will eat, you can take the right amount of insulin . Or, if you always use the same amount of insulin , you need to make sure that you eat the same amount of carbohydrate at meals.  If you need more help to understand carbohydrate counting and food labels, ask your doctor, dietitian, or diabetes educator.  How do you get started with meal planning?  Here are some tips to get started:   Plan your meals a week at a time. Don't forget to include snacks too.   Use cookbooks or online recipes to plan several main meals. Plan some quick meals for busy nights. You also can double some recipes that freeze well. Then you can save half for other busy nights when you don't have time to cook.   Make sure you have the ingredients you need for your recipes. If you're running low on basic items, put these items on your shopping list too.   List foods that you use to make breakfasts, lunches, and snacks. List plenty of fruits and vegetables.   Post this list on the refrigerator. Add to it as you think of more things you need.   Take the list to the store to do your weekly shopping.  Follow-up care is a key part of your treatment and safety. Be sure to make and go to all appointments, and call your doctor if you are having problems. It's also a good idea to know your test results and keep a list of the medicines you take.  Where can  you learn more?  Go to [email or web address] and sign in to your MyChart account. Enter [phone] in the Search Health Information box to learn more about Learning About Meal Planning for Diabetes.     If you do not have an account, please click on the Sign Up Now link.  Current as of: June 26, 2015  Content Version: 11.5   2006-2017 [employer]. Care instructions adapted under license by [facility]. If you have questions about a medical condition  or this instruction, always ask your healthcare professional. [employer] disclaims any warranty or liability for your use of this information.       Patient Education        Learning About Foot and Toenail Care  Checking your loved one's feet and keeping them clean and soft can help prevent cracks and infection in the skin. This is especially important for people who have diabetes. Keeping toenails trimmed-and polished if that's what the person likes-also helps the person feel well-groomed.  If the person you care for has diabetes or has foot problems, such as bad bunions and corns, think about taking them to see a podiatrist. This is a doctor who specializes in the care of the feet. Sometimes a podiatrist will come to the home if the person can't go out for visits.  Try to take the person for salon pedicures if that is what they want. It's a chance to get out and see people and continue a favorite activity.  You can do basic nail care at home. Usually all you need to do is keep the nails clean and at a safe length.  How do you trim someone's toenails?  Try to trim the person's nails every week. Or check the nails each week to see if they need to be trimmed. It's easiest to trim nails after the person has had a shower or foot bath. It makes the nails softer and easier to trim.  Start by gathering your supplies. You will need toenail clippers and a nail file. You may also need nail polish and nail polish remover.  To trim the  nails:  1. Wash and dry your hands. You don't need to wear gloves.  2. Use nail polish remover to take off any polish.  3. Hold the person's foot and toe steady with one hand while you trim the nail with your other hand. Trim the nails straight across. Leave the nails a little longer at the corners so that the sharp ends don't cut into the skin.  4. Keep the nails no longer than the tip of the toes.  5. Let the nails dry if they are still damp and soft.  6. Use a nail file to gently smooth the edges of the nails, especially at the corners. They may be sharp after the nails are cut straight.  7. Apply nail polish, if the person wants it.  If the person's nails are thick and discolored, it may be safest to have a podiatrist cut them.  What else do you need to know?  When you're caring for someone's nails, it is important to remember not to trim or cut the cuticles. A minor cut in a cuticle could lead to an infection. Wash the feet daily in the shower or bath or in a basin made for washing feet. It's extra important to wash the feet carefully if the person has diabetes. After washing the feet, dry gently. Put lotion on the feet, especially on the heels. But don't put it between the toes.  If the person doesn't have diabetes and you see signs of athlete's foot (such as dry, cracking, or itchy skin between the toes), you can try an over-the-counter medicine. These medicines can kill the fungus that causes athlete's foot. If the problem doesn't go away, talk to the person's doctor. Look every day for cuts or signs of infection, such as pain, swelling, redness, or warmth. If you see any of these signs-especially in someone who  has diabetes-call the doctor.  Where can you learn more?  Go to [email or web address] and sign in to your MyChart account. Enter [id number] in the Search Health Information box to learn more about Learning About Foot and Toenail Care.     If you do not have an account, please click on  the Sign Up Now link.  Current as of: January 07, 2015  Content Version: 11.5   2006-2017 [employer]. Care instructions adapted under license by [facility]. If you have questions about a medical condition or this instruction, always ask your healthcare professional. [employer] disclaims any warranty or liability for your use of this information.       Patient Education        DASH Diet: Care Instructions  Your Care Instructions    The DASH diet is an eating plan that can help lower your blood pressure. DASH stands for Dietary Approaches to Stop Hypertension. Hypertension is high blood pressure.  The DASH diet focuses on eating foods that are high in calcium , potassium, and magnesium . These nutrients can lower blood pressure. The foods that are highest in these nutrients are fruits, vegetables, low-fat dairy products, nuts, seeds, and legumes. But taking calcium , potassium, and magnesium  supplements instead of eating foods that are high in those nutrients does not have the same effect. The DASH diet also includes whole grains, fish, and poultry.  The DASH diet is one of several lifestyle changes your doctor may recommend to lower your high blood pressure. Your doctor may also want you to decrease the amount of sodium in your diet. Lowering sodium while following the DASH diet can lower blood pressure even further than just the DASH diet alone.  Follow-up care is a key part of your treatment and safety. Be sure to make and go to all appointments, and call your doctor if you are having problems. It's also a good idea to know your test results and keep a list of the medicines you take.  How can you care for yourself at home?  Following the DASH diet   Eat 4 to 5 servings of fruit each day. A serving is 1 medium-sized piece of fruit,  cup chopped or canned fruit, 1/4 cup dried fruit, or 4 ounces ( cup) of fruit juice. Choose fruit more often than fruit juice.   Eat 4 to 5 servings  of vegetables each day. A serving is 1 cup of lettuce or raw leafy vegetables,  cup of chopped or cooked vegetables, or 4 ounces ( cup) of vegetable juice. Choose vegetables more often than vegetable juice.   Get 2 to 3 servings of low-fat and fat-free dairy each day. A serving is 8 ounces of milk, 1 cup of yogurt, or 1  ounces of cheese.   Eat 6 to 8 servings of grains each day. A serving is 1 slice of bread, 1 ounce of dry cereal, or  cup of cooked rice, pasta, or cooked cereal. Try to choose whole-grain products as much as possible.   Limit lean meat, poultry, and fish to 2 servings each day. A serving is 3 ounces, about the size of a deck of cards.   Eat 4 to 5 servings of nuts, seeds, and legumes (cooked dried beans, lentils, and split peas) each week. A serving is 1/3 cup of nuts, 2 tablespoons of seeds, or  cup of cooked beans or peas.   Limit fats and oils to 2 to 3 servings each day. A serving is 1  teaspoon of vegetable oil or 2 tablespoons of salad dressing.   Limit sweets and added sugars to 5 servings or less a week. A serving is 1 tablespoon jelly or jam,  cup sorbet, or 1 cup of lemonade.   Eat less than 2,300 milligrams (mg) of sodium a day. If you limit your sodium to 1,500 mg a day, you can lower your blood pressure even more.  Tips for success   Start small. Do not try to make dramatic changes to your diet all at once. You might feel that you are missing out on your favorite foods and then be more likely to not follow the plan. Make small changes, and stick with them. Once those changes become habit, add a few more changes.   Try some of the following:   Make it a goal to eat a fruit or vegetable at every meal and at snacks. This will make it easy to get the recommended amount of fruits and vegetables each day.   Try yogurt topped with fruit and nuts for a snack or healthy dessert.   Add lettuce, tomato, cucumber, and onion to sandwiches.   Combine a ready-made pizza crust with  low-fat mozzarella cheese and lots of vegetable toppings. Try using tomatoes, squash, spinach, broccoli, carrots, cauliflower, and onions.   Have a variety of cut-up vegetables with a low-fat dip as an appetizer instead of chips and dip.   Sprinkle sunflower seeds or chopped almonds over salads. Or try adding chopped walnuts or almonds to cooked vegetables.   Try some vegetarian meals using beans and peas. Add garbanzo or kidney beans to salads. Make burritos and tacos with mashed pinto beans or black beans.  Where can you learn more?  Go to [email or web address] and sign in to your MyChart account. Enter [phone] in the Search Health Information box to learn more about DASH Diet: Care Instructions.     If you do not have an account, please click on the Sign Up Now link.  Current as of: January 04, 2015  Content Version: 11.5   2006-2017 [employer]. Care instructions adapted under license by [facility]. If you have questions about a medical condition or this instruction, always ask your healthcare professional. [employer] disclaims any warranty or liability for your use of this information.

## 2016-05-01 NOTE — Progress Notes (Signed)
 Subjective:      Patient ID: Shelly Richard is a 31 y.o. female.who presents for f/u Type 2 DM. Patient reports I took all my insulin  but forgot to take my other medications. Patient reports I'll take it as soon as I get home. Patient denies CP/SOB at present. Patient is in no distress.     Chief Complaint   Patient presents with    Diabetes     1 week follow up     Vitals:    05/01/16 0856   BP: 128/84   Site: Right Arm   Position: Sitting   Cuff Size: Large Adult   Pulse: 91   Resp: 16   Temp: 98.5 F (36.9 C)   TempSrc: Oral   SpO2: 99%   Weight: 208 lb 9.6 oz (94.6 kg)   Height: 5' 4 (1.626 m)     Current Outpatient Prescriptions   Medication Sig Dispense Refill    metFORMIN , OSM, (FORTAMET ) 500 MG extended release tablet Take 2 tablets by mouth daily (with breakfast) 60 tablet 3    Insulin  Degludec-Liraglutide  (XULTOPHY ) 100-3.6 UNIT-MG/ML SOPN Inject 20 Units into the skin nightly 2 pen 0    Insulin  Degludec-Liraglutide  (XULTOPHY ) 100-3.6 UNIT-MG/ML SOPN Inject 16 units daily 2 pen 0    ibuprofen  (ADVIL ;MOTRIN ) 600 MG tablet Take 1 tablet by mouth every 6 hours as needed for Pain 30 tablet 2    losartan  (COZAAR ) 25 MG tablet Take 25 mg by mouth daily Unsure of dose      levothyroxine  (SYNTHROID ) 25 MCG tablet Take 25 mcg by mouth Daily Unsure of dose.      albuterol  sulfate HFA 108 (90 Base) MCG/ACT inhaler Inhale 2 puffs into the lungs every 6 hours as needed for Wheezing or Shortness of Breath 1 Inhaler 5    simvastatin  (ZOCOR ) 40 MG tablet Take 1 tablet by mouth daily Indications: dosage unknown 30 tablet 3    Montelukast  Sodium (SINGULAIR  PO) Take 10 mg by mouth daily       naproxen  (NAPROSYN ) 500 MG tablet Take 1 tablet by mouth 2 times daily for 20 doses 20 tablet 0    famotidine  (PEPCID ) 20 MG tablet Take 1 tablet by mouth 2 times daily for 4 days 8 tablet 0     No current facility-administered medications for this visit.        Diabetes   She presents for her follow-up diabetic  visit. She has type 2 diabetes mellitus. No MedicAlert identification noted. Her disease course has been improving. There are no hypoglycemic associated symptoms. There are no diabetic associated symptoms. Pertinent negatives for diabetes include no polydipsia, no polyphagia and no polyuria. There are no hypoglycemic complications. There are no diabetic complications. Risk factors for coronary artery disease include diabetes mellitus, hypertension, sedentary lifestyle and obesity. Current diabetic treatment includes insulin  injections and oral agent (monotherapy). She is compliant with treatment some of the time. Her weight is fluctuating minimally. She is following a generally unhealthy diet. When asked about meal planning, she reported none. She rarely participates in exercise. Her home blood glucose trend is decreasing steadily. An ACE inhibitor/angiotensin II receptor blocker is being taken.       Review of Systems   Constitutional: Negative.    HENT: Negative.    Respiratory: Negative.    Cardiovascular: Negative.    Endocrine: Negative for polydipsia, polyphagia and polyuria.        Type 2 DM, RBS 277 mg/dL, J8r 89.6 (98/89/7981). Results reviewed and  discussed with patient during OV. Patient verbalized understanding.      Musculoskeletal: Negative.    Skin: Negative.    Psychiatric/Behavioral: Negative.    All other systems reviewed and are negative.      Objective:   Physical Exam   Constitutional: She appears well-developed and well-nourished. No distress.   HENT:   Head: Normocephalic and atraumatic.   Neck: Normal range of motion. Neck supple.   Cardiovascular: Normal rate, regular rhythm, normal heart sounds, intact distal pulses and normal pulses.    Pulmonary/Chest: Effort normal and breath sounds normal. No accessory muscle usage. No apnea, no tachypnea and no bradypnea. No respiratory distress. She has no wheezes.   Abdominal: Soft. Normal appearance. There is no tenderness. There is no CVA  tenderness.   Musculoskeletal: Normal range of motion.   Neurological: She is alert.   Skin: Skin is warm, dry and intact. She is not diaphoretic.   Psychiatric: She has a normal mood and affect. Her speech is normal and behavior is normal. Judgment and thought content normal. Cognition and memory are normal.       Assessment:      Type 2 DM, improving.     Shelly Richard received counseling on the following healthy behaviors: nutrition, exercise and medication adherence    Patient given educational materials on Diabetes, Nutrition, Exercise and Hypertension    I have instructed Shelly Richard to complete a self tracking handout on Blood Sugars  and Blood Pressures  and instructed them to bring it with them to her next appointment.     Discussed use, benefit, and side effects of prescribed medications.  Barriers to medication compliance addressed.  All patient questions answered.  Pt voiced understanding.     Patient also educated on the importance of being compliant with routine office visits in order to assess chronic illness progression, medication regimen/side effects, and effectiveness of plan of care. Patient was able to verbalize understanding.    Patient is in no distress at time of departure.     Plan:      Dash Diet  Diet and Exercise.  Drink 64 ounces of water  daily.  Eat 2-3 servings of fruits and vegetables daily.  Take all medications as prescribed.   Xultophy  insulin  20 units daily.   Metformin  1000 mg daily.   Call 911 or go to the nearest emergency room for chest pain or shortness of breath.   Return in 1 week f/u Type 2 DM. SABRA

## 2016-05-08 ENCOUNTER — Encounter: Attending: Registered Nurse | Primary: Oncology

## 2016-05-15 ENCOUNTER — Ambulatory Visit
Admit: 2016-05-15 | Discharge: 2016-05-15 | Payer: PRIVATE HEALTH INSURANCE | Attending: Internal Medicine | Primary: Oncology

## 2016-05-15 DIAGNOSIS — Z794 Long term (current) use of insulin: Secondary | ICD-10-CM

## 2016-05-15 LAB — POCT GLUCOSE: Glucose: 166 mg/dL

## 2016-05-15 NOTE — Progress Notes (Signed)
 Subjective:      Patient ID: Shelly Richard is a 31 y.o. female.    Diabetes   She presents for her follow-up diabetic visit. She has type 2 diabetes mellitus. Her disease course has been improving. There are no hypoglycemic associated symptoms. There are no diabetic associated symptoms. Symptoms are improving. Risk factors for coronary artery disease include diabetes mellitus, dyslipidemia, family history and obesity. Current diabetic treatment includes oral agent (triple therapy). She is compliant with treatment all of the time. Her weight is stable. She is following a generally unhealthy diet. She has not had a previous visit with a dietitian. She never participates in exercise. There is no change in her home blood glucose trend. An ACE inhibitor/angiotensin II receptor blocker is being taken. She does not see a podiatrist.Eye exam is not current.       Review of Systems   Constitutional: Negative.    HENT: Negative.    Eyes: Negative.    Respiratory: Negative.    Cardiovascular: Negative.    Gastrointestinal: Negative.    Genitourinary: Negative.    Musculoskeletal: Negative.    Skin: Negative.    Neurological: Negative.    Psychiatric/Behavioral: Negative.        Objective:   Physical Exam   Constitutional: She is oriented to person, place, and time. She appears well-developed and well-nourished. No distress.   HENT:   Head: Normocephalic and atraumatic.   Right Ear: External ear normal.   Left Ear: External ear normal.   Nose: Nose normal.   Mouth/Throat: Oropharynx is clear and moist.   Eyes: Conjunctivae and EOM are normal. Pupils are equal, round, and reactive to light. Left eye exhibits no discharge. No scleral icterus.   Neck: Normal range of motion. No JVD present. No thyromegaly present.   Cardiovascular: Normal rate, regular rhythm and normal heart sounds.  Exam reveals no gallop.    No murmur heard.  Pulmonary/Chest: Effort normal and breath sounds normal. No respiratory distress. She has no wheezes.  She has no rales.   Abdominal: Soft. Bowel sounds are normal. She exhibits no mass. There is no tenderness.   Musculoskeletal: Normal range of motion. She exhibits no edema.   Lymphadenopathy:     She has no cervical adenopathy.   Neurological: She is alert and oriented to person, place, and time. She has normal reflexes. No cranial nerve deficit.   Skin: Skin is warm and dry. No erythema.   Psychiatric: She has a normal mood and affect. Her behavior is normal. Judgment and thought content normal.       Assessment:      Diabetes improving      Plan:      Continue xultophy     24 units          Return in 4 weeks

## 2016-05-27 ENCOUNTER — Ambulatory Visit: Admit: 2016-05-27 | Discharge: 2016-05-27 | Payer: PRIVATE HEALTH INSURANCE | Attending: Family | Primary: Oncology

## 2016-05-27 DIAGNOSIS — K529 Noninfective gastroenteritis and colitis, unspecified: Secondary | ICD-10-CM

## 2016-05-27 LAB — POCT INFLUENZA A/B
Influenza A Ab: NEGATIVE
Influenza B Ab: NEGATIVE

## 2016-05-27 NOTE — Patient Instructions (Signed)
 Patient Education        Gastroenteritis: Care Instructions  Your Care Instructions    Gastroenteritis is an illness that may cause nausea, vomiting, and diarrhea. It is sometimes called "stomach flu." It can be caused by bacteria or a virus.  You will probably begin to feel better in 1 to 2 days. In the meantime, get plenty of rest and make sure you do not become dehydrated. Dehydration occurs when your body loses too much fluid.  Follow-up care is a key part of your treatment and safety. Be sure to make and go to all appointments, and call your doctor if you are having problems. It's also a good idea to know your test results and keep a list of the medicines you take.  How can you care for yourself at home?   If your doctor prescribed antibiotics, take them as directed. Do not stop taking them just because you feel better. You need to take the full course of antibiotics.   Drink plenty of fluids to prevent dehydration, enough so that your urine is light yellow or clear like water. Choose water and other caffeine-free clear liquids until you feel better. If you have kidney, heart, or liver disease and have to limit fluids, talk with your doctor before you increase your fluid intake.   Drink fluids slowly, in frequent, small amounts, because drinking too much too fast can cause vomiting.   Begin eating mild foods, such as dry toast, yogurt, applesauce, bananas, and rice. Avoid spicy, hot, or high-fat foods, and do not drink alcohol or caffeine for a day or two. Do not drink milk or eat ice cream until you are feeling better.  How to prevent gastroenteritis   Keep hot foods hot and cold foods cold.   Do not eat meats, dressings, salads, or other foods that have been kept at room temperature for more than 2 hours.   Use a thermometer to check your refrigerator. It should be between 74F and 61F.   Defrost meats in the refrigerator or microwave, not on the kitchen counter.   Keep your hands and your kitchen  clean. Wash your hands, cutting boards, and countertops with hot soapy water frequently.   Cook meat until it is well done.   Do not eat raw eggs or uncooked sauces made with raw eggs.   Do not take chances. If food looks or tastes spoiled, throw it out.  When should you call for help?  Call 911 anytime you think you may need emergency care. For example, call if:  ?  You vomit blood or what looks like coffee grounds.   ?  You passed out (lost consciousness).   ?  You pass maroon or very bloody stools.   ?Call your doctor now or seek immediate medical care if:  ?  You have severe belly pain.   ?  You have signs of needing more fluids. You have sunken eyes, a dry mouth, and pass only a little dark urine.   ?  You feel like you are going to faint.   ?  You have increased belly pain that does not go away in 1 to 2 days.   ?  You have new or increased nausea, or you are vomiting.   ?  You have a new or higher fever.   ?  Your stools are black and tarlike or have streaks of blood.   ?Watch closely for changes in your health, and be sure to  contact your doctor if:  ?  You are dizzy or lightheaded.   ?  You urinate less than usual, or your urine is dark yellow or brown.   ?  You do not feel better with each day that goes by.   Where can you learn more?  Go to [email or web address] and sign in to your MyChart account. Enter N142 in the Search Health Information box to learn more about "Gastroenteritis: Care Instructions."     If you do not have an account, please click on the "Sign Up Now" link.  Current as of: June 16, 2015  Content Version: 11.5   2006-2017 [employer]. Care instructions adapted under license by [facility]. If you have questions about a medical condition or this instruction, always ask your healthcare professional. [employer] disclaims any warranty or liability for your use of this information.

## 2016-05-27 NOTE — Progress Notes (Signed)
 Subjective:      Patient ID: Shelly Richard is a 31 y.o. female.    HPI 31 yo female presents to clinic for diarrhea, vomiting, headache, and body aches. Symptoms began 2 days ago(sat). She also reports fever of 102 on Saturday only. By Sunday, symptoms of emesis and diarrhea subsided. She remains with headache, body aches, and low urine output. Appetite is decreased so she is not eating much. States at this time, she has only had crackers for the day. Nothing else to eat or drink.     Prior to Visit Medications    Medication Sig Taking? Authorizing Provider   metFORMIN , OSM, (FORTAMET ) 500 MG extended release tablet Take 2 tablets by mouth daily (with breakfast) Yes Jae Kipper, CNP   Insulin  Degludec-Liraglutide  (XULTOPHY ) 100-3.6 UNIT-MG/ML SOPN Inject 20 Units into the skin nightly Yes Verlena Daniels, CNP   Insulin  Degludec-Liraglutide  (XULTOPHY ) 100-3.6 UNIT-MG/ML SOPN Inject 16 units daily Yes C Keith Melvin, MD   ibuprofen  (ADVIL ;MOTRIN ) 600 MG tablet Take 1 tablet by mouth every 6 hours as needed for Pain Yes C Francis Scurry, MD   losartan  (COZAAR ) 25 MG tablet Take 25 mg by mouth daily Unsure of dose Yes Historical Provider, MD   levothyroxine  (SYNTHROID ) 25 MCG tablet Take 25 mcg by mouth Daily Unsure of dose. Yes Historical Provider, MD   albuterol  sulfate HFA 108 (90 Base) MCG/ACT inhaler Inhale 2 puffs into the lungs every 6 hours as needed for Wheezing or Shortness of Breath Yes C Francis Scurry, MD   simvastatin  (ZOCOR ) 40 MG tablet Take 1 tablet by mouth daily Indications: dosage unknown Yes C Francis Scurry, MD   Montelukast  Sodium (SINGULAIR  PO) Take 10 mg by mouth daily  Yes Historical Provider, MD   naproxen  (NAPROSYN ) 500 MG tablet Take 1 tablet by mouth 2 times daily for 20 doses  Adine JINNY Das, PA-C   famotidine  (PEPCID ) 20 MG tablet Take 1 tablet by mouth 2 times daily for 4 days  Macie E Roetting, CNP          Review of Systems   Gastrointestinal: Positive for diarrhea and vomiting.  Negative for abdominal pain.   Neurological: Positive for headaches.   All other systems reviewed and are negative.      Objective:   Physical Exam   Constitutional: She appears well-developed and well-nourished.   HENT:   Right Ear: Hearing, tympanic membrane, external ear and ear canal normal.   Left Ear: Hearing, tympanic membrane, external ear and ear canal normal.   Mouth/Throat: Oropharynx is clear and moist.   Cardiovascular: Normal rate, regular rhythm and normal heart sounds.    Pulmonary/Chest: Effort normal and breath sounds normal.   Abdominal: Soft. Bowel sounds are normal. There is no tenderness.   Skin: Skin is warm and dry.       Assessment:      1. Gastroenteritis             Plan:      Symptoms have seem to have resolved  Probable gastroenteritis  Hydrate really well, not surprised with some decreased UO, as she is not eating or drinking.   She verbalizes understanding  BRAT diet  F/u if no improvement with pcp

## 2016-05-27 NOTE — Addendum Note (Signed)
 Addended by: LATISHA RIGGS on: 05/27/2016 03:44 PM     Modules accepted: Orders

## 2016-05-30 NOTE — Telephone Encounter (Signed)
 Attempted to contact patient on 05/30/2016.     Result: left message on the patient's voicemail asking patient to return my call.    Pre-Visit planning not completed.

## 2016-06-06 NOTE — Telephone Encounter (Signed)
 Attempted to contact patient on 06/06/2016.     Result: no answer at the patient's number, no voicemail available.    Pre-Visit planning not completed.

## 2016-06-11 NOTE — Telephone Encounter (Signed)
 2 failed contact attempts.  Closing encounter

## 2016-06-12 ENCOUNTER — Ambulatory Visit
Admit: 2016-06-12 | Discharge: 2016-06-12 | Payer: PRIVATE HEALTH INSURANCE | Attending: Internal Medicine | Primary: Oncology

## 2016-06-12 DIAGNOSIS — E119 Type 2 diabetes mellitus without complications: Secondary | ICD-10-CM

## 2016-06-12 LAB — POCT GLYCOSYLATED HEMOGLOBIN (HGB A1C): Hemoglobin A1C: 7.1 %

## 2016-06-12 LAB — POCT GLUCOSE: Glucose: 142 mg/dL

## 2016-06-12 MED ORDER — INSULIN DEGLUDEC-LIRAGLUTIDE 100-3.6 UNIT-MG/ML SC SOPN
100-3.6 | PEN_INJECTOR | SUBCUTANEOUS | 0 refills | Status: DC
Start: 2016-06-12 — End: 2017-02-25

## 2016-06-12 MED ORDER — PROBIOTIC ACIDOPHILUS PO TABS
ORAL_TABLET | Freq: Every day | ORAL | 5 refills | 30.00000 days | Status: DC
Start: 2016-06-12 — End: 2017-11-16

## 2016-06-12 NOTE — Progress Notes (Signed)
 Subjective:      Patient ID: Shelly Richard is a 31 y.o. female.    Diabetes   She presents for her follow-up diabetic visit. She has type 2 diabetes mellitus. Her disease course has been improving. There are no diabetic associated symptoms. There are no hypoglycemic complications. Symptoms are improving. There are no diabetic complications. Risk factors for coronary artery disease include diabetes mellitus. Current diabetic treatment includes oral agent (dual therapy) and insulin  injections. She is compliant with treatment all of the time.       Review of Systems   Constitutional: Negative.    HENT: Negative.    Eyes: Negative.    Respiratory: Negative.    Cardiovascular: Negative.    Gastrointestinal: Negative.    Genitourinary: Negative.    Musculoskeletal: Negative.    Skin: Negative.    Neurological: Negative.    Psychiatric/Behavioral: Negative.        Objective:   Physical Exam   Constitutional: She is oriented to person, place, and time. She appears well-developed and well-nourished. No distress.   HENT:   Head: Normocephalic and atraumatic.   Right Ear: External ear normal.   Left Ear: External ear normal.   Nose: Nose normal.   Mouth/Throat: Oropharynx is clear and moist.   Eyes: Conjunctivae and EOM are normal. Pupils are equal, round, and reactive to light. Left eye exhibits no discharge. No scleral icterus.   Neck: Normal range of motion. No JVD present. No thyromegaly present.   Cardiovascular: Normal rate, regular rhythm and normal heart sounds.  Exam reveals no gallop.    No murmur heard.  Pulmonary/Chest: Effort normal and breath sounds normal. No respiratory distress. She has no wheezes. She has no rales.   Abdominal: Soft. Bowel sounds are normal. She exhibits no mass. There is no tenderness.   Musculoskeletal: Normal range of motion. She exhibits no edema.   Lymphadenopathy:     She has no cervical adenopathy.   Neurological: She is alert and oriented to person, place, and time. She has normal  reflexes. No cranial nerve deficit.   Skin: Skin is warm and dry. No erythema.   Psychiatric: She has a normal mood and affect. Her behavior is normal. Judgment and thought content normal.       Assessment:      Diabetes  improving      Plan:     continue xultophy  24 qd    a1c    Return in 4 weeks

## 2016-06-28 MED ORDER — DULAGLUTIDE 1.5 MG/0.5ML SC SOPN
1.5 | PEN_INJECTOR | SUBCUTANEOUS | 3 refills | 30.00000 days | Status: DC
Start: 2016-06-28 — End: 2016-10-29

## 2016-07-15 ENCOUNTER — Encounter: Attending: Internal Medicine | Primary: Oncology

## 2016-07-30 MED ORDER — SIMVASTATIN 40 MG PO TABS
40 | ORAL_TABLET | ORAL | 0 refills | Status: DC
Start: 2016-07-30 — End: 2016-09-11

## 2016-08-15 ENCOUNTER — Ambulatory Visit
Admit: 2016-08-15 | Discharge: 2016-08-15 | Payer: PRIVATE HEALTH INSURANCE | Attending: Registered Nurse | Primary: Oncology

## 2016-08-15 DIAGNOSIS — R519 Headache, unspecified: Secondary | ICD-10-CM

## 2016-08-15 MED ORDER — SUMATRIPTAN SUCCINATE 25 MG PO TABS
25 MG | ORAL_TABLET | Freq: Once | ORAL | 1 refills | Status: DC | PRN
Start: 2016-08-15 — End: 2019-02-10
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
## 2016-08-15 NOTE — Patient Instructions (Addendum)
DIET / EXERCISE    DRINK 64 OUNCES OF WATER DAILY.    TAKE ALL MEDICATIONS DAILY AS PRESCRIBED.    IMITREX 25 MG ONCE DAILY AS NEEDED FOR HEADACHE,  NOT TO EXCEED 50 MG DAILY.    TYLENOL / IBUPROFEN AS NEEDED FOR PAIN.    CALL 911 OR GO TO THE NEAREST EMERGENCY ROOM FOR CHEST PAIN / SHORTNESS OF BREATH.     RETURN IN 1 MONTH FOR F/U HTN / HLD  / AS NEEDED.     The maximum daily dose of Tylenol for a healthy adult who weighs at least 150 pounds is 4,000 milligrams (mg). The maximum daily dose for extended periods can seriously damage the liver. It's best to take the lowest dose necessary and stay closer to 3,000 mg per day as your maximum dose.     The maximum amount of ibuprofen for adults is 800 milligrams per dose or 3200 mg per day (4 maximum doses). Use only the smallest amount needed to get relief from your pain, swelling, or fever.      Patient Education        Type 2 Diabetes: Care Instructions  Your Care Instructions    Type 2 diabetes is a disease that develops when the body's tissues cannot use insulin properly. Over time, the pancreas cannot make enough insulin. Insulin is a hormone that helps the body's cells use sugar (glucose) for energy. It also helps the body store extra sugar in muscle, fat, and liver cells.  Without insulin, the sugar cannot get into the cells to do its work. It stays in the blood instead. This can cause high blood sugar levels. A person has diabetes when the blood sugar stays too high too much of the time. Over time, diabetes can lead to diseases of the heart, blood vessels, nerves, kidneys, and eyes.  You may be able to control your blood sugar by losing weight, eating a healthy diet, and getting daily exercise. You may also have to take insulin or other diabetes medicine.  Follow-up care is a key part of your treatment and safety. Be sure to make and go to all appointments. Call your doctor if you are having problems. It's also a good idea to know your test results and keep a  list of the medicines you take.  How can you care for yourself at home?   Keep your blood sugar at a target level (which you set with your doctor).   Eat a good diet that spreads carbohydrate throughout the day. Carbohydrate-the body's main source of fuel-affects blood sugar more than any other nutrient. Carbohydrate is in fruits, vegetables, milk, and yogurt. It also is in breads, cereals, vegetables such as potatoes and corn, and sugary foods such as candy and cakes.   Aim for 30 minutes of exercise on most, preferably all, days of the week. Walking is a good choice. You also may want to do other activities, such as running, swimming, cycling, or playing tennis or team sports. If your doctor says it's okay, do muscle-strengthening exercises at least 2 times a week.   Take your medicines exactly as prescribed. Call your doctor if you think you are having a problem with your medicine. You will get more details on the specific medicines your doctor prescribes.   Check your blood sugar as often as your doctor recommends. It is important to keep track of any symptoms you have, such as low blood sugar. Also tell your doctor if you  have any changes in your activities, diet, or insulin use.   Talk to your doctor before you start taking aspirin every day. Aspirin can help certain people lower their risk of a heart attack or stroke. But taking aspirin isn't right for everyone, because it can cause serious bleeding.   Do not smoke. If you need help quitting, talk to your doctor about stop-smoking programs and medicines. These can increase your chances of quitting for good.   Keep your cholesterol and blood pressure at normal levels. You may need to take one or more medicines to reach your goals. Take them exactly as directed. Do not stop or change a medicine without talking to your doctor first.  When should you call for help?  Call 911 anytime you think you may need emergency care. For example, call if:    You  passed out (lost consciousness), or you suddenly become very sleepy or confused. (You may have very low blood sugar.)   Call your doctor now or seek immediate medical care if:    Your blood sugar is 300 mg/dL or is higher than the level your doctor has set for you.     You have symptoms of low blood sugar, such as:   Sweating.   Feeling nervous, shaky, and weak.   Extreme hunger and slight nausea.   Dizziness and headache.   Blurred vision.   Confusion.   Watch closely for changes in your health, and be sure to contact your doctor if:    You often have problems controlling your blood sugar.     You have symptoms of long-term diabetes problems, such as:   New vision changes.   New pain, numbness, or tingling in your hands or feet.   Skin problems.   Where can you learn more?  Go to [email or web address] and sign in to your MyChart account. Enter C553 in the [place] box to learn more about "Type 2 Diabetes: Care Instructions."     If you do not have an account, please click on the "Sign Up Now" link.  Current as of: March 21, 2016  Content Version: 11.6   2006-2018 Healthwise, Incorporated. Care instructions adapted under license by [facility]. If you have questions about a medical condition or this instruction, always ask your healthcare professional. [place] any warranty or liability for your use of this information.       Patient Education        High Cholesterol: Care Instructions  Your Care Instructions    Cholesterol is a type of fat in your blood. It is needed for many body functions, such as making new cells. Cholesterol is made by your body. It also comes from food you eat. High cholesterol means that you have too much of the fat in your blood. This raises your risk of a heart attack and stroke.  LDL and HDL are part of your total cholesterol. LDL is the "bad" cholesterol. High LDL can raise your risk for heart disease,  heart attack, and stroke. HDL is the "good" cholesterol. It helps clear bad cholesterol from the body. High HDL is linked with a lower risk of heart disease, heart attack, and stroke.  Your cholesterol levels help your doctor find out your risk for having a heart attack or stroke. You and your doctor can talk about whether you need to lower your risk and what treatment is best for you.  A heart-healthy lifestyle along with medicines can help lower  your cholesterol and your risk. The way you choose to lower your risk will depend on how high your risk is for heart attack and stroke. It will also depend on how you feel about taking medicines.  Follow-up care is a key part of your treatment and safety. Be sure to make and go to all appointments, and call your doctor if you are having problems. It's also a good idea to know your test results and keep a list of the medicines you take.  How can you care for yourself at home?   Eat a variety of foods every day. Good choices include fruits, vegetables, whole grains (like oatmeal), dried beans and peas, nuts and seeds, soy products (like tofu), and fat-free or low-fat dairy products.   Replace butter, margarine, and hydrogenated or partially hydrogenated oils with olive and canola oils. (Canola oil margarine without trans fat is fine.)   Replace red meat with fish, poultry, and soy protein (like tofu).   Limit processed and packaged foods like chips, crackers, and cookies.   Bake, broil, or steam foods. Don't fry them.   Be physically active. Get at least 30 minutes of exercise on most days of the week. Walking is a good choice. You also may want to do other activities, such as running, swimming, cycling, or playing tennis or team sports.   Stay at a healthy weight or lose weight by making the changes in eating and physical activity listed above. Losing just a small amount of weight, even 5 to 10 pounds, can reduce your risk for having a heart attack or stroke.   Do  not smoke.  When should you call for help?  Watch closely for changes in your health, and be sure to contact your doctor if:    You need help making lifestyle changes.     You have questions about your medicine.   Where can you learn more?  Go to [email or web address] and sign in to your MyChart account. Enter [phone] in the [place] box to learn more about "High Cholesterol: Care Instructions."     If you do not have an account, please click on the "Sign Up Now" link.  Current as of: Aug 23, 2015  Content Version: 11.6   2006-2018 Healthwise, Incorporated. Care instructions adapted under license by [facility]. If you have questions about a medical condition or this instruction, always ask your healthcare professional. [place] any warranty or liability for your use of this information.       Patient Education        Eating Healthy Foods: Care Instructions  Your Care Instructions    Eating healthy foods can help lower your risk for disease. Healthy food gives you energy and keeps your heart strong, your brain active, your muscles working, and your bones strong.  A healthy diet includes a variety of foods from the basic food groups: grains, vegetables, fruits, milk and milk products, and meat and beans. Some people may eat more of their favorite foods from only one food group and, as a result, miss getting the nutrients they need. So, it is important to pay attention not only to what you eat but also to what you are missing from your diet. You can eat a healthy, balanced diet by making a few small changes.  Follow-up care is a key part of your treatment and safety. Be sure to make and go to all appointments, and call your doctor if you are having  problems. It's also a good idea to know your test results and keep a list of the medicines you take.  How can you care for yourself at home?  Look at what you eat   Keep a food diary for a week or two and  record everything you eat or drink. Track the number of servings you eat from each food group.   For a balanced diet every day, eat a variety of:   6 or more ounce-equivalents of grains, such as cereals, breads, crackers, rice, or pasta, every day. An ounce-equivalent is 1 slice of bread, 1 cup of ready-to-eat cereal, or  cup of cooked rice, cooked pasta, or cooked cereal.   2 cups of vegetables, especially:   Dark-green vegetables such as broccoli and spinach.   Orange vegetables such as carrots and sweet potatoes.   Dry beans (such as pinto and kidney beans) and peas (such as lentils).   2 cups of fresh, frozen, or canned fruit. A small apple or 1 banana or orange equals 1 cup.   3 cups of nonfat or low-fat milk, yogurt, or other milk products.   5 ounces of meat and beans, such as chicken, fish, lean meat, beans, nuts, and seeds. One egg, 1 tablespoon of peanut butter,  ounce nuts or seeds, or  cup of cooked beans equals 1 ounce of meat.   Learn how to read food labels for serving sizes and ingredients. Fast-food and convenience-food meals often contain few or no fruits or vegetables. Make sure you eat some fruits and vegetables to make the meal more nutritious.   Look at your food diary. For each food group, add up what you have eaten and then divide the total by the number of days. This will give you an idea of how much you are eating from each food group. See if you can find some ways to change your diet to make it more healthy.  Start small   Do not try to make dramatic changes to your diet all at once. You might feel that you are missing out on your favorite foods and then be more likely to fail.   Start slowly, and gradually change your habits. Try some of the following:   Use whole wheat bread instead of white bread.   Use nonfat or low-fat milk instead of whole milk.   Eat brown rice instead of white rice, and eat whole wheat pasta instead of white-flour pasta.   Try low-fat cheeses  and low-fat yogurt.   Add more fruits and vegetables to meals and have them for snacks.   Add lettuce, tomato, cucumber, and onion to sandwiches.   Add fruit to yogurt and cereal.  Enjoy food   You can still eat your favorite foods. You just may need to eat less of them. If your favorite foods are high in fat, salt, and sugar, limit how often you eat them, but do not cut them out entirely.   Eat a wide variety of foods.  Make healthy choices when eating out   The type of restaurant you choose can help you make healthy choices. Even fast-food chains are now offering more low-fat or healthier choices on the menu.   Choose smaller portions, or take half of your meal home.   When eating out, try:   A veggie pizza with a whole wheat crust or grilled chicken (instead of sausage or pepperoni).   Pasta with roasted vegetables, grilled chicken, or marinara sauce instead  of cream sauce.   A vegetable wrap or grilled chicken wrap.   Broiled or poached food instead of fried or breaded items.  Make healthy choices easy   Buy packaged, prewashed, ready-to-eat fresh vegetables and fruits, such as baby carrots, salad mixes, and chopped or shredded broccoli and cauliflower.   Buy packaged, presliced fruits, such as melon or pineapple.   Choose 100% fruit or vegetable juice instead of soda. Limit juice intake to 4 to 6 oz ( to  cup) a day.   Blend low-fat yogurt, fruit juice, and canned or frozen fruit to make a smoothie for breakfast or a snack.  Where can you learn more?  Go to [email or web address] and sign in to your MyChart account. Enter [phone] in the [place] box to learn more about "Eating Healthy Foods: Care Instructions."     If you do not have an account, please click on the "Sign Up Now" link.  Current as of: Aug 25, 2015  Content Version: 11.6   2006-2018 Healthwise, Incorporated. Care instructions adapted under license by [facility]. If you have questions about a  medical condition or this instruction, always ask your healthcare professional. [place] any warranty or liability for your use of this information.       Patient Education        Headache: Care Instructions  Your Care Instructions    Headaches have many possible causes. Most headaches aren't a sign of a more serious problem, and they will get better on their own. Home treatment may help you feel better faster.  The doctor has checked you carefully, but problems can develop later. If you notice any problems or new symptoms, get medical treatment right away.  Follow-up care is a key part of your treatment and safety. Be sure to make and go to all appointments, and call your doctor if you are having problems. It's also a good idea to know your test results and keep a list of the medicines you take.  How can you care for yourself at home?   Do not drive if you have taken a prescription pain medicine.   Rest in a quiet, dark room until your headache is gone. Close your eyes and try to relax or go to sleep. Don't watch TV or read.   Put a cold, moist cloth or cold pack on the painful area for 10 to 20 minutes at a time. Put a thin cloth between the cold pack and your skin.   Use a warm, moist towel or a heating pad set on low to relax tight shoulder and neck muscles.   Have someone gently massage your neck and shoulders.   Take pain medicines exactly as directed.   If the doctor gave you a prescription medicine for pain, take it as prescribed.   If you are not taking a prescription pain medicine, ask your doctor if you can take an over-the-counter medicine.   Be careful not to take pain medicine more often than the instructions allow, because you may get worse or more frequent headaches when the medicine wears off.   Do not ignore new symptoms that occur with a headache, such as a fever, weakness or numbness, vision changes, or confusion. These may be signs of a more serious  problem.  To prevent headaches   Keep a headache diary so you can figure out what triggers your headaches. Avoiding triggers may help you prevent headaches. Record when each headache began, how  long it lasted, and what the pain was like (throbbing, aching, stabbing, or dull). Write down any other symptoms you had with the headache, such as nausea, flashing lights or dark spots, or sensitivity to bright light or loud noise. Note if the headache occurred near your period. List anything that might have triggered the headache, such as certain foods (chocolate, cheese, wine) or odors, smoke, bright light, stress, or lack of sleep.   Find healthy ways to deal with stress. Headaches are most common during or right after stressful times. Take time to relax before and after you do something that has caused a headache in the past.   Try to keep your muscles relaxed by keeping good posture. Check your jaw, face, neck, and shoulder muscles for tension, and try relaxing them. When sitting at a desk, change positions often, and stretch for 30 seconds each hour.   Get plenty of sleep and exercise.   Eat regularly and well. Long periods without food can trigger a headache.   Treat yourself to a massage. Some people find that regular massages are very helpful in relieving tension.   Limit caffeine by not drinking too much coffee, tea, or soda. But don't quit caffeine suddenly, because that can also give you headaches.   Reduce eyestrain from computers by blinking frequently and looking away from the computer screen every so often. Make sure you have proper eyewear and that your monitor is set up properly, about an arm's length away.   Seek help if you have depression or anxiety. Your headaches may be linked to these conditions. Treatment can both prevent headaches and help with symptoms of anxiety or depression.  When should you call for help?  Call 911 anytime you think you may need emergency care. For example, call if:     You have signs of a stroke. These may include:   Sudden numbness, paralysis, or weakness in your face, arm, or leg, especially on only one side of your body.   Sudden vision changes.   Sudden trouble speaking.   Sudden confusion or trouble understanding simple statements.   Sudden problems with walking or balance.   A sudden, severe headache that is different from past headaches.   Call your doctor now or seek immediate medical care if:    You have a new or worse headache.     Your headache gets much worse.   Where can you learn more?  Go to [email or web address] and sign in to your MyChart account. Enter M271 in the [place] box to learn more about "Headache: Care Instructions."     If you do not have an account, please click on the "Sign Up Now" link.  Current as of: January 22, 2016  Content Version: 11.6   2006-2018 Healthwise, Incorporated. Care instructions adapted under license by [facility]. If you have questions about a medical condition or this instruction, always ask your healthcare professional. [place] any warranty or liability for your use of this information.       Patient Education        Migraine Headache: Care Instructions  Your Care Instructions  Migraines are painful, throbbing headaches that often start on one side of the head. They may cause nausea and vomiting and make you sensitive to light, sound, or smell.  Without treatment, migraines can last from 4 hours to a few days. Medicines can help prevent migraines or stop them after they have started. Your doctor can help you  find which ones work best for you.  Follow-up care is a key part of your treatment and safety. Be sure to make and go to all appointments, and call your doctor if you are having problems. It's also a good idea to know your test results and keep a list of the medicines you take.  How can you care for yourself at home?   Do not drive if you have  taken a prescription pain medicine.   Rest in a quiet, dark room until your headache is gone. Close your eyes, and try to relax or go to sleep. Don't watch TV or read.   Put a cold, moist cloth or cold pack on the painful area for 10 to 20 minutes at a time. Put a thin cloth between the cold pack and your skin.   Use a warm, moist towel or a heating pad set on low to relax tight shoulder and neck muscles.   Have someone gently massage your neck and shoulders.   Take your medicines exactly as prescribed. Call your doctor if you think you are having a problem with your medicine. You will get more details on the specific medicines your doctor prescribes.   Be careful not to take pain medicine more often than the instructions allow. You could get worse or more frequent headaches when the medicine wears off.  To prevent migraines   Keep a headache diary so you can figure out what triggers your headaches. Avoiding triggers may help you prevent headaches. Record when each headache began, how long it lasted, and what the pain was like. (Was it throbbing, aching, stabbing, or dull?) Write down any other symptoms you had with the headache, such as nausea, flashing lights or dark spots, or sensitivity to bright light or loud noise. Note if the headache occurred near your period. List anything that might have triggered the headache. Triggers may include certain foods (chocolate, cheese, wine) or odors, smoke, bright light, stress, or lack of sleep.   If your doctor has prescribed medicine for your migraines, take it as directed. You may have medicine that you take only when you get a migraine and medicine that you take all the time to help prevent migraines.   If your doctor has prescribed medicine for when you get a headache, take it at the first sign of a migraine, unless your doctor has given you other instructions.   If your doctor has prescribed medicine to prevent migraines, take it exactly as prescribed. Call  your doctor if you think you are having a problem with your medicine.   Find healthy ways to deal with stress. Migraines are most common during or right after stressful times. Take time to relax before and after you do something that has caused a migraine in the past.   Try to keep your muscles relaxed by keeping good posture. Check your jaw, face, neck, and shoulder muscles for tension. Try to relax them. When you sit at a desk, change positions often. And make sure to stretch for 30 seconds each hour.   Get plenty of sleep and exercise.   Eat meals on a regular schedule. Avoid foods and drinks that often trigger migraines. These include chocolate, alcohol (especially red wine and port), aspartame, monosodium glutamate (MSG), and some additives found in foods (such as hot dogs, bacon, cold cuts, aged cheeses, and pickled foods).   Limit caffeine. Don't drink too much coffee, tea, or soda. But don't quit caffeine suddenly. That can  also give you migraines.   Do not smoke or allow others to smoke around you. If you need help quitting, talk to your doctor about stop-smoking programs and medicines. These can increase your chances of quitting for good.   If you are taking birth control pills or hormone therapy, talk to your doctor about whether they are triggering your migraines.  When should you call for help?  Call 911 anytime you think you may need emergency care. For example, call if:    You have signs of a stroke. These may include:   Sudden numbness, paralysis, or weakness in your face, arm, or leg, especially on only one side of your body.   Sudden vision changes.   Sudden trouble speaking.   Sudden confusion or trouble understanding simple statements.   Sudden problems with walking or balance.   A sudden, severe headache that is different from past headaches.   Call your doctor now or seek immediate medical care if:    You have new or worse nausea and vomiting.     You have a new or higher  fever.     Your headache gets much worse.   Watch closely for changes in your health, and be sure to contact your doctor if:    You are not getting better after 2 days (48 hours).   Where can you learn more?  Go to [email or web address] and sign in to your MyChart account. Enter [phone] in the [place] box to learn more about "Migraine Headache: Care Instructions."     If you do not have an account, please click on the "Sign Up Now" link.  Current as of: January 22, 2016  Content Version: 11.6   2006-2018 Healthwise, Incorporated. Care instructions adapted under license by [facility]. If you have questions about a medical condition or this instruction, always ask your healthcare professional. [place] any warranty or liability for your use of this information.

## 2016-08-26 MED ORDER — VITAMIN D3 50 MCG (2000 UT) PO TABS
50 | ORAL_TABLET | ORAL | 0 refills | 30.00000 days | Status: DC
Start: 2016-08-26 — End: 2017-05-25

## 2016-08-26 MED ORDER — SIMVASTATIN 40 MG PO TABS
40 | ORAL_TABLET | ORAL | 0 refills | Status: DC
Start: 2016-08-26 — End: 2016-09-23

## 2016-08-26 MED ORDER — LOSARTAN POTASSIUM 25 MG PO TABS
25 | ORAL_TABLET | ORAL | 0 refills | Status: DC
Start: 2016-08-26 — End: 2016-09-23

## 2016-08-26 MED ORDER — CETIRIZINE HCL 10 MG PO TABS
10 | ORAL_TABLET | ORAL | 0 refills | 30.00000 days | Status: DC
Start: 2016-08-26 — End: 2016-09-19

## 2016-08-26 MED ORDER — MONTELUKAST SODIUM 10 MG PO TABS
10 | ORAL_TABLET | ORAL | 0 refills | 90.00000 days | Status: DC
Start: 2016-08-26 — End: 2016-09-23

## 2016-09-11 ENCOUNTER — Telehealth

## 2016-09-11 NOTE — Telephone Encounter (Signed)
Dr. Trilby Drummer:    Notes: patient will go to the [facility] lab & have labs drawn prior to visit. Please sign orders.    This patient has an upcoming appointment with you for Diabetes, Hyperlipidemia, Hypertension and Hypothyroidism.     In planning for that visit I have completed the following pre-visit planning:     Pre-Visit Planning Checklist:  Patient contacted: yes  Verified patient by name and date of birth: yes    Health Maintenance items reviewed:    No pre-visit planning health maintenance topics to review at this time    Labs and procedures pended: Non-Fasting Hemoglobin A1C  and Urine Microalbumin   Labs and procedures discussed with patient: yes  Reminded patient to check with their insurance company about coverage for lab tests and lab location: yes    Preliminary Medication Reconciliation: was performed.    Reminded patient to arrive early: yes    Please complete the med-reconciliation and sign the appropriate labs as soon as possible.      Estil Daft, MA  Pre-Services Specialist

## 2016-09-19 ENCOUNTER — Encounter

## 2016-09-20 MED ORDER — CETIRIZINE HCL 10 MG PO TABS
10 | ORAL_TABLET | ORAL | 0 refills | 30.00000 days | Status: DC
Start: 2016-09-20 — End: 2017-01-08

## 2016-09-20 MED ORDER — METFORMIN HCL ER (OSM) 500 MG PO TB24
500 | ORAL_TABLET | ORAL | 0 refills | Status: DC
Start: 2016-09-20 — End: 2016-10-21

## 2016-09-24 ENCOUNTER — Ambulatory Visit
Admit: 2016-09-24 | Discharge: 2016-09-24 | Payer: PRIVATE HEALTH INSURANCE | Attending: Internal Medicine | Primary: Oncology

## 2016-09-24 DIAGNOSIS — E119 Type 2 diabetes mellitus without complications: Secondary | ICD-10-CM

## 2016-09-24 LAB — POCT GLYCOSYLATED HEMOGLOBIN (HGB A1C): Hemoglobin A1C: 8 %

## 2016-09-24 LAB — POCT GLUCOSE: Glucose: 127 mg/dL

## 2016-09-24 MED ORDER — SIMVASTATIN 40 MG PO TABS
40 | ORAL_TABLET | ORAL | 3 refills | Status: DC
Start: 2016-09-24 — End: 2017-01-19

## 2016-09-24 MED ORDER — MONTELUKAST SODIUM 10 MG PO TABS
10 | ORAL_TABLET | ORAL | 2 refills | 90.00000 days | Status: DC
Start: 2016-09-24 — End: 2016-12-18

## 2016-09-24 MED ORDER — LOSARTAN POTASSIUM 25 MG PO TABS
25 | ORAL_TABLET | ORAL | 5 refills | Status: DC
Start: 2016-09-24 — End: 2017-03-18

## 2016-09-24 NOTE — Progress Notes (Signed)
Subjective:      Patient ID: Shelly Richard is a 31 y.o. female.    Diabetes   She presents for her follow-up diabetic visit. She has type 2 diabetes mellitus. Her disease course has been improving. There are no hypoglycemic associated symptoms. There are no diabetic associated symptoms. Symptoms are improving. She is following a generally healthy diet. When asked about meal planning, she reported none. She participates in exercise intermittently. An ACE inhibitor/angiotensin II receptor blocker is being taken. She does not see a podiatrist.Eye exam is current.   Hypertension   This is a chronic problem. The problem is unchanged. The problem is controlled. Pertinent negatives include no anxiety. There are no associated agents to hypertension. Risk factors for coronary artery disease include sedentary lifestyle, obesity and diabetes mellitus. Past treatments include lifestyle changes and angiotensin blockers. The current treatment provides moderate improvement.   Hyperlipidemia   This is a chronic problem. The problem is controlled. Recent lipid tests were reviewed and are variable. Exacerbating diseases include diabetes and obesity. There are no known factors aggravating her hyperlipidemia. Current antihyperlipidemic treatment includes exercise and statins. The current treatment provides moderate improvement of lipids. Risk factors for coronary artery disease include hypertension, obesity, diabetes mellitus, dyslipidemia and family history.       Review of Systems   Constitutional: Negative.    HENT: Negative.    Eyes: Negative.    Respiratory: Negative.    Cardiovascular: Negative.    Gastrointestinal: Negative.    Genitourinary: Negative.    Musculoskeletal: Negative.    Skin: Negative.    Neurological: Negative.    Psychiatric/Behavioral: Negative.        Objective:   Physical Exam   Constitutional: She is oriented to person, place, and time. She appears well-developed and well-nourished. No distress.   HENT:    Head: Normocephalic and atraumatic.   Right Ear: External ear normal.   Left Ear: External ear normal.   Nose: Nose normal.   Mouth/Throat: Oropharynx is clear and moist.   Eyes: Conjunctivae and EOM are normal. Pupils are equal, round, and reactive to light. Left eye exhibits no discharge. No scleral icterus.   Neck: Normal range of motion. No JVD present. No thyromegaly present.   Cardiovascular: Normal rate, regular rhythm and normal heart sounds.  Exam reveals no gallop.    No murmur heard.  Pulmonary/Chest: Effort normal and breath sounds normal. No respiratory distress. She has no wheezes. She has no rales.   Abdominal: Soft. Bowel sounds are normal. She exhibits no mass. There is no tenderness.   Musculoskeletal: Normal range of motion. She exhibits no edema.   Lymphadenopathy:     She has no cervical adenopathy.   Neurological: She is alert and oriented to person, place, and time. She has normal reflexes. No cranial nerve deficit.   Skin: Skin is warm and dry. No erythema.   Psychiatric: She has a normal mood and affect. Her behavior is normal. Judgment and thought content normal.     Current Outpatient Prescriptions on File Prior to Visit   Medication Sig Dispense Refill   . losartan (COZAAR) 25 MG tablet TAKE 1 TABLET BY MOUTH EVERY DAY 30 tablet 5   . simvastatin (ZOCOR) 40 MG tablet TAKE ONE TABLET BY MOUTH DAILY 30 tablet 3   . montelukast (SINGULAIR) 10 MG tablet TAKE 1 TABLET BY MOUTH DAILY 30 tablet 2   . cetirizine (ZYRTEC) 10 MG tablet TAKE 1 TABLET BY MOUTH DAILY 30 tablet 0   .  metFORMIN, OSM, (FORTAMET) 500 MG extended release tablet TAKE 2 TABLETS BY MOUTH DAILY WITH BREAKFAST 60 tablet 0   . valACYclovir (VALTREX) 500 MG tablet TK 1 T PO D  1   . levothyroxine (SYNTHROID) 50 MCG tablet Take 50 mcg by mouth daily  3   . Cholecalciferol (VITAMIN D3) 2000 units TABS TAKE 1 TABLET BY MOUTH DAILY 100 tablet 0   . Dulaglutide (TRULICITY) 1.5 PP/5.0DT SOPN Inject 1.5 Units into the skin once a week  2 pen 3   . Insulin Degludec-Liraglutide (XULTOPHY) 100-3.6 UNIT-MG/ML SOPN Inject 16 units daily (Patient taking differently: Inject 24 Units into the skin Inject 16 units daily) 2 pen 0   . Lactobacillus (PROBIOTIC ACIDOPHILUS) TABS Take 1 tablet by mouth daily 30 tablet 5   . ibuprofen (ADVIL;MOTRIN) 600 MG tablet Take 1 tablet by mouth every 6 hours as needed for Pain 30 tablet 2   . albuterol sulfate HFA 108 (90 Base) MCG/ACT inhaler Inhale 2 puffs into the lungs every 6 hours as needed for Wheezing or Shortness of Breath 1 Inhaler 5   . naproxen (NAPROSYN) 500 MG tablet Take 1 tablet by mouth 2 times daily for 20 doses 20 tablet 0   . SUMAtriptan (IMITREX) 25 MG tablet Take 1 tablet by mouth once as needed for Migraine 30 tablet 1   . famotidine (PEPCID) 20 MG tablet Take 1 tablet by mouth 2 times daily for 4 days 8 tablet 0     No current facility-administered medications on file prior to visit.          Assessment:     diabetes improving. Fasting currently       Diagnosis Orders   1. Type 2 diabetes mellitus without complication, without long-term current use of insulin (HCC)  POCT Glucose      Diagnosis Orders   1. Type 2 diabetes mellitus without complication, without long-term current use of insulin (HCC)  POCT Glucose    Microalbumin / creatinine urine ratio   2. Mixed hyperlipidemia     3. Iron deficiency anemia, unspecified iron deficiency anemia type     4. Family history of diabetes mellitus     5. Essential hypertension            Plan:      Continue meds     Refills    Labs    Return in 2 months

## 2016-09-25 LAB — ALBUMIN/CREATININE RATIO, URINE
Albumin/Creatinine Ratio: 11.7 mg/g (ref 0.0–30.0)
Creatinine, Ur: 136.4 mg/dL (ref 28.0–259.0)
Microalb, Ur: 1.6 mg/dL (ref <2.0)

## 2016-09-25 LAB — CBC
Hematocrit: 28.7 % — ABNORMAL LOW (ref 36.0–48.0)
Hemoglobin: 8.9 g/dL — ABNORMAL LOW (ref 12.0–16.0)
MCH: 22 pg — ABNORMAL LOW (ref 26.0–34.0)
MCHC: 31.1 g/dL (ref 31.0–36.0)
MCV: 70.5 fL — ABNORMAL LOW (ref 80.0–100.0)
MPV: 8.7 fL (ref 5.0–10.5)
Platelets: 356 10*3/uL (ref 135–450)
RBC: 4.07 M/uL (ref 4.00–5.20)
RDW: 17.3 % — ABNORMAL HIGH (ref 12.4–15.4)
WBC: 12.3 10*3/uL — ABNORMAL HIGH (ref 4.0–11.0)

## 2016-10-21 ENCOUNTER — Encounter

## 2016-10-22 MED ORDER — METFORMIN HCL ER (OSM) 500 MG PO TB24
500 | ORAL_TABLET | ORAL | 5 refills | Status: DC
Start: 2016-10-22 — End: 2017-04-19

## 2016-10-29 ENCOUNTER — Ambulatory Visit
Admit: 2016-10-29 | Discharge: 2016-10-29 | Payer: PRIVATE HEALTH INSURANCE | Attending: Registered Nurse | Primary: Oncology

## 2016-10-29 DIAGNOSIS — R197 Diarrhea, unspecified: Secondary | ICD-10-CM

## 2016-10-29 NOTE — Patient Instructions (Addendum)
DIET / EXERCISE    DRINK 64 OUNCES OF WATER DAILY.    TAKE ALL MEDICATIONS DAILY AS PRESCRIBED.    CALL 911 OR GO TO THE NEAREST EMERGENCY ROOM FOR CHEST PAIN / SHORTNESS OF BREATH.     RETURN IN 1 MONTH FOR F/U HTN / DM / AS NEEDED.     Patient Education        Iron Deficiency Anemia: Care Instructions  Your Care Instructions    Anemia means that you do not have enough red blood cells. Red blood cells carry oxygen around your body. When you have anemia, it can make you pale, weak, and tired.  Many things can cause anemia. The most common cause is loss of blood. This can happen if you have heavy menstrual periods. It can also happen if you have bleeding in your stomach or bowel.  You can also get anemia if you don't have enough iron in your diet or if it's hard for your body to absorb iron. In some cases, pregnancy causes anemia. That's because a pregnant woman needs more iron.  Your doctor may do more tests to find the cause of your anemia. If a disease or other health problem is causing it, your doctor will treat that problem.  It's important to follow up with your doctor to make sure that your iron level returns to normal.  Follow-up care is a key part of your treatment and safety. Be sure to make and go to all appointments, and call your doctor if you are having problems. It's also a good idea to know your test results and keep a list of the medicines you take.  How can you care for yourself at home?  ?? If your doctor recommended iron pills, take them as directed.  ?? Try to take the pills on an empty stomach. You can do this about 1 hour before or 2 hours after meals. But you may need to take iron with food to avoid an upset stomach.  ?? Do not take antacids or drink milk or anything with caffeine within 2 hours of when you take your iron. They can keep your body from absorbing the iron well.  ?? Vitamin C helps your body absorb iron. You may want to take iron pills with a glass of orange juice or some other food  high in vitamin C.  ?? Iron pills may cause stomach problems. These include heartburn, nausea, diarrhea, constipation, and cramps. It can help to drink plenty of fluids and include fruits, vegetables, and fiber in your diet.  ?? It's normal for iron pills to make your stool a greenish or grayish black. But internal bleeding can also cause dark stool. So it's important to tell your doctor about any color changes.  ?? Call your doctor if you think you are having a problem with your iron pills. Even after you start to feel better, it will take several months for your body to build up its supply of iron.  ?? If you miss a pill, don't take a double dose.  ?? Keep iron pills out of the reach of small children. Too much iron can be very dangerous.  ?? Eat foods with a lot of iron. These include red meat, shellfish, poultry, and eggs. They also include beans, raisins, whole-grain bread, and leafy green vegetables.  ?? Steam your vegetables. This is the best way to prepare them if you want to get as much iron as possible.  ?? Be safe  with medicines. Do not take nonsteroidal anti-inflammatory pain relievers unless your doctor tells you to. These include aspirin, naproxen (Aleve), and ibuprofen (Advil, Motrin).  ?? Liquid iron can stain your teeth. But you can mix it with water or juice and drink it with a straw. Then it won't get on your teeth.  When should you call for help?  Call 911 anytime you think you may need emergency care. For example, call if:  ?? ?? You passed out (lost consciousness).   ??Call your doctor now or seek immediate medical care if:  ?? ?? You are short of breath.   ?? ?? You are dizzy or light-headed, or you feel like you may faint.   ?? ?? You have new or worse bleeding.   ??Watch closely for changes in your health, and be sure to contact your doctor if:  ?? ?? You feel weaker or more tired than usual.   ?? ?? You do not get better as expected.   Where can you learn more?  Go to [email or web address] and  sign in to your MyChart account. Enter [phone] in the [employer] box to learn more about "Iron Deficiency Anemia: Care Instructions."     If you do not have an account, please click on the "Sign Up Now" link.  Current as of: January 22, 2016  Content Version: 11.6  ?? 2006-2018 Health[employer], Incorporated. Care instructions adapted under license by [facility]. If you have questions about a medical condition or this instruction, always ask your healthcare professional. [place] any warranty or liability for your use of this information.       Patient Education        Learning About Meal Planning for Diabetes  Why plan your meals?  Meal planning can be a key part of managing diabetes. Planning meals and snacks with the right balance of carbohydrate, protein, and fat can help you keep your blood sugar at the target level you set with your doctor.  You don't have to eat special foods. You can eat what your family eats, including sweets once in a while. But you do have to pay attention to how often you eat and how much you eat of certain foods.  You may want to work with a dietitian or a certified diabetes educator. He or she can give you tips and meal ideas and can answer your questions about meal planning. This health professional can also help you reach a healthy weight if that is one of your goals.  What plan is right for you?  Your dietitian or diabetes educator may suggest that you start with the plate format or carbohydrate counting.  The plate format  The plate format is a simple way to help you manage how you eat. You plan meals by learning how much space each food should take on a plate. Using the plate format helps you spread carbohydrate throughout the day. It can make it easier to keep your blood sugar level within your target range. It also helps you see if you're eating healthy portion sizes.  To use the plate format, you put non-starchy vegetables on half your plate. Add  meat or meat substitutes on one-quarter of the plate. Put a grain or starchy vegetable (such as brown rice or a potato) on the final quarter of the plate. You can add a small piece of fruit and some low-fat or fat-free milk or yogurt, depending on your carbohydrate goal for each meal.  Here are some tips for using the plate format:  ?? Make sure that you are not using an oversized plate. A 9-inch plate is best. Many restaurants use larger plates.  ?? Get used to using the plate format at home. Then you can use it when you eat out.  ?? Write down your questions about using the plate format. Talk to your doctor, a dietitian, or a diabetes educator about your concerns.  Carbohydrate counting  With carbohydrate counting, you plan meals based on the amount of carbohydrate in each food. Carbohydrate raises blood sugar higher and more quickly than any other nutrient. It is found in desserts, breads and cereals, and fruit. It's also found in starchy vegetables such as potatoes and corn, grains such as rice and pasta, and milk and yogurt. Spreading carbohydrate throughout the day helps keep your blood sugar levels within your target range.  Your daily amount depends on several things, including your weight, how active you are, which diabetes medicines you take, and what your goals are for your blood sugar levels. A registered dietitian or diabetes educator can help you plan how much carbohydrate to include in each meal and snack.  A guideline for your daily amount of carbohydrate is:  ?? 45 to 60 grams at each meal. That's about the same as 3 to 4 carbohydrate servings.  ?? 15 to 20 grams at each snack. That's about the same as 1 carbohydrate serving.  The Nutrition Facts label on packaged foods tells you how much carbohydrate is in a serving of the food. First, look at the serving size on the food label. Is that the amount you eat in a serving? All of the nutrition information on a food label is based on that serving size. So if  you eat more or less than that, you'll need to adjust the other numbers. Total carbohydrate is the next thing you need to look for on the label. If you count carbohydrate servings, one serving of carbohydrate is 15 grams.  For foods that don't come with labels, such as fresh fruits and vegetables, you'll need a guide that lists carbohydrate in these foods. Ask your doctor, dietitian, or diabetes educator about books or other nutrition guides you can use.  If you take insulin, you need to know how many grams of carbohydrate are in a meal. This lets you know how much rapid-acting insulin to take before you eat. If you use an insulin pump, you get a constant rate of insulin during the day. So the pump must be programmed at meals to give you extra insulin to cover the rise in blood sugar after meals.  When you know how much carbohydrate you will eat, you can take the right amount of insulin. Or, if you always use the same amount of insulin, you need to make sure that you eat the same amount of carbohydrate at meals.  If you need more help to understand carbohydrate counting and food labels, ask your doctor, dietitian, or diabetes educator.  How do you get started with meal planning?  Here are some tips to get started:  ?? Plan your meals a week at a time. Don't forget to include snacks too.  ?? Use cookbooks or online recipes to plan several main meals. Plan some quick meals for busy nights. You also can double some recipes that freeze well. Then you can save half for other busy nights when you don't have time to cook.  ?? Make sure you have  the ingredients you need for your recipes. If you're running low on basic items, put these items on your shopping list too.  ?? List foods that you use to make breakfasts, lunches, and snacks. List plenty of fruits and vegetables.  ?? Post this list on the refrigerator. Add to it as you think of more things you need.  ?? Take the list to the store to do your weekly shopping.  Follow-up  care is a key part of your treatment and safety. Be sure to make and go to all appointments, and call your doctor if you are having problems. It's also a good idea to know your test results and keep a list of the medicines you take.  Where can you learn more?  Go to [email or web address] and sign in to your MyChart account. Enter [phone] in the [place] box to learn more about "Learning About Meal Planning for Diabetes."     If you do not have an account, please click on the "Sign Up Now" link.  Current as of: March 21, 2016  Content Version: 11.6  ?? 2006-2018 Healthwise, Incorporated. Care instructions adapted under license by [facility]. If you have questions about a medical condition or this instruction, always ask your healthcare professional. [place] any warranty or liability for your use of this information.       Patient Education        Diarrhea: Care Instructions  Your Care Instructions    Diarrhea is loose, watery stools (bowel movements). The exact cause is often hard to find. Sometimes diarrhea is your body's way of getting rid of what caused an upset stomach. Viruses, food poisoning, and many medicines can cause diarrhea. Some people get diarrhea in response to emotional stress, anxiety, or certain foods.  Almost everyone has diarrhea now and then. It usually isn't serious, and your stools will return to normal soon. The important thing to do is replace the fluids you have lost, so you can prevent dehydration.  The doctor has checked you carefully, but problems can develop later. If you notice any problems or new symptoms, get medical treatment right away.  Follow-up care is a key part of your treatment and safety. Be sure to make and go to all appointments, and call your doctor if you are having problems. It's also a good idea to know your test results and keep a list of the medicines you take.  How can you care for yourself at  home?  ?? Watch for signs of dehydration, which means your body has lost too much water. Dehydration is a serious condition and should be treated right away. Signs of dehydration are:  ?? Increasing thirst and dry eyes and mouth.  ?? Feeling faint or lightheaded.  ?? Darker urine, and a smaller amount of urine than normal.  ?? To prevent dehydration, drink plenty of fluids, enough so that your urine is light yellow or clear like water. Choose water and other caffeine-free clear liquids until you feel better. If you have kidney, heart, or liver disease and have to limit fluids, talk with your doctor before you increase the amount of fluids you drink.  ?? Begin eating small amounts of mild foods the next day, if you feel like it.  ?? Try yogurt that has live cultures of Lactobacillus. (Check the label.)  ?? Avoid spicy foods, fruits, alcohol, and caffeine until 48 hours after all symptoms are gone.  ?? Avoid chewing gum that contains sorbitol.  ??  Avoid dairy products (except for yogurt with Lactobacillus) while you have diarrhea and for 3 days after symptoms are gone.  ?? The doctor may recommend that you take over-the-counter medicine, such as loperamide (Imodium), if you still have diarrhea after 6 hours. Read and follow all instructions on the label. Do not use this medicine if you have bloody diarrhea, a high fever, or other signs of serious illness. Call your doctor if you think you are having a problem with your medicine.  When should you call for help?  Call 911 anytime you think you may need emergency care. For example, call if:  ?? ?? You passed out (lost consciousness).   ?? ?? Your stools are maroon or very bloody.   ??Call your doctor now or seek immediate medical care if:  ?? ?? You are dizzy or lightheaded, or you feel like you may faint.   ?? ?? Your stools are black and look like tar, or they have streaks of blood.   ?? ?? You have new or worse belly pain.   ?? ?? You have symptoms of dehydration, such as:  ?? Dry eyes and a  dry mouth.  ?? Passing only a little dark urine.  ?? Feeling thirstier than usual.   ?? ?? You have a new or higher fever.   ??Watch closely for changes in your health, and be sure to contact your doctor if:  ?? ?? Your diarrhea is getting worse.   ?? ?? You see pus in the diarrhea.   ?? ?? You are not getting better after 2 days (48 hours).   Where can you learn more?  Go to [email or web address] and sign in to your MyChart account. Enter [phone] in the [place] box to learn more about "Diarrhea: Care Instructions."     If you do not have an account, please click on the "Sign Up Now" link.  Current as of: March 04, 2016  Content Version: 11.6  ?? 2006-2018 Healthwise, Incorporated. Care instructions adapted under license by [facility]. If you have questions about a medical condition or this instruction, always ask your healthcare professional. [place] any warranty or liability for your use of this information.       Patient Education        Headache: Care Instructions  Your Care Instructions    Headaches have many possible causes. Most headaches aren't a sign of a more serious problem, and they will get better on their own. Home treatment may help you feel better faster.  The doctor has checked you carefully, but problems can develop later. If you notice any problems or new symptoms, get medical treatment right away.  Follow-up care is a key part of your treatment and safety. Be sure to make and go to all appointments, and call your doctor if you are having problems. It's also a good idea to know your test results and keep a list of the medicines you take.  How can you care for yourself at home?  ?? Do not drive if you have taken a prescription pain medicine.  ?? Rest in a quiet, dark room until your headache is gone. Close your eyes and try to relax or go to sleep. Don't watch TV or read.  ?? Put a cold, moist cloth or cold pack on the painful area for 10 to 20  minutes at a time. Put a thin cloth between the cold pack and your skin.  ?? Use a warm,  moist towel or a heating pad set on low to relax tight shoulder and neck muscles.  ?? Have someone gently massage your neck and shoulders.  ?? Take pain medicines exactly as directed.  ?? If the doctor gave you a prescription medicine for pain, take it as prescribed.  ?? If you are not taking a prescription pain medicine, ask your doctor if you can take an over-the-counter medicine.  ?? Be careful not to take pain medicine more often than the instructions allow, because you may get worse or more frequent headaches when the medicine wears off.  ?? Do not ignore new symptoms that occur with a headache, such as a fever, weakness or numbness, vision changes, or confusion. These may be signs of a more serious problem.  To prevent headaches  ?? Keep a headache diary so you can figure out what triggers your headaches. Avoiding triggers may help you prevent headaches. Record when each headache began, how long it lasted, and what the pain was like (throbbing, aching, stabbing, or dull). Write down any other symptoms you had with the headache, such as nausea, flashing lights or dark spots, or sensitivity to bright light or loud noise. Note if the headache occurred near your period. List anything that might have triggered the headache, such as certain foods (chocolate, cheese, wine) or odors, smoke, bright light, stress, or lack of sleep.  ?? Find healthy ways to deal with stress. Headaches are most common during or right after stressful times. Take time to relax before and after you do something that has caused a headache in the past.  ?? Try to keep your muscles relaxed by keeping good posture. Check your jaw, face, neck, and shoulder muscles for tension, and try relaxing them. When sitting at a desk, change positions often, and stretch for 30 seconds each hour.  ?? Get plenty of sleep and exercise.  ?? Eat regularly and well. Long periods without  food can trigger a headache.  ?? Treat yourself to a massage. Some people find that regular massages are very helpful in relieving tension.  ?? Limit caffeine by not drinking too much coffee, tea, or soda. But don't quit caffeine suddenly, because that can also give you headaches.  ?? Reduce eyestrain from computers by blinking frequently and looking away from the computer screen every so often. Make sure you have proper eyewear and that your monitor is set up properly, about an arm's length away.  ?? Seek help if you have depression or anxiety. Your headaches may be linked to these conditions. Treatment can both prevent headaches and help with symptoms of anxiety or depression.  When should you call for help?  Call 911 anytime you think you may need emergency care. For example, call if:  ?? ?? You have signs of a stroke. These may include:  ?? Sudden numbness, paralysis, or weakness in your face, arm, or leg, especially on only one side of your body.  ?? Sudden vision changes.  ?? Sudden trouble speaking.  ?? Sudden confusion or trouble understanding simple statements.  ?? Sudden problems with walking or balance.  ?? A sudden, severe headache that is different from past headaches.   ??Call your doctor now or seek immediate medical care if:  ?? ?? You have a new or worse headache.   ?? ?? Your headache gets much worse.   Where can you learn more?  Go to [email or web address] and sign in to your MyChart account. Enter [id number] in the [place]  Information box to learn more about "Headache: Care Instructions."     If you do not have an account, please click on the "Sign Up Now" link.  Current as of: January 22, 2016  Content Version: 11.6  ?? 2006-2018 Healthwise, Incorporated. Care instructions adapted under license by [facility]. If you have questions about a medical condition or this instruction, always ask your healthcare professional. [place] any warranty or liability for your use of  this information.       Patient Education        High Blood Pressure: Care Instructions  Your Care Instructions    If your blood pressure is usually above 130/80, you have high blood pressure, or hypertension. That means the top number is 130 or higher or the bottom number is 80 or higher, or both.  Despite what a lot of people think, high blood pressure usually doesn't cause headaches or make you feel dizzy or lightheaded. It usually has no symptoms. But it does increase your risk for heart attack, stroke, and kidney or eye damage. The higher your blood pressure, the more your risk increases.  Your doctor will give you a goal for your blood pressure. Your goal will be based on your health and your age.  Lifestyle changes, such as eating healthy and being active, are always important to help lower blood pressure. You might also take medicine to reach your blood pressure goal.  Follow-up care is a key part of your treatment and safety. Be sure to make and go to all appointments, and call your doctor if you are having problems. It's also a good idea to know your test results and keep a list of the medicines you take.  How can you care for yourself at home?  Medical treatment  ?? If you stop taking your medicine, your blood pressure will go back up. You may take one or more types of medicine to lower your blood pressure. Be safe with medicines. Take your medicine exactly as prescribed. Call your doctor if you think you are having a problem with your medicine.  ?? Talk to your doctor before you start taking aspirin every day. Aspirin can help certain people lower their risk of a heart attack or stroke. But taking aspirin isn't right for everyone, because it can cause serious bleeding.  ?? See your doctor regularly. You may need to see the doctor more often at first or until your blood pressure comes down.  ?? If you are taking blood pressure medicine, talk to your doctor before you take decongestants or anti-inflammatory  medicine, such as ibuprofen. Some of these medicines can raise blood pressure.  ?? Learn how to check your blood pressure at home.  Lifestyle changes  ?? Stay at a healthy weight. This is especially important if you put on weight around the waist. Losing even 10 pounds can help you lower your blood pressure.  ?? If your doctor recommends it, get more exercise. Walking is a good choice. Bit by bit, increase the amount you walk every day. Try for at least 30 minutes on most days of the week. You also may want to swim, bike, or do other activities.  ?? Avoid or limit alcohol. Talk to your doctor about whether you can drink any alcohol.  ?? Try to limit how much sodium you eat to less than 2,300 milligrams (mg) a day. Your doctor may ask you to try to eat less than 1,500 mg a day.  ??  Eat plenty of fruits (such as bananas and oranges), vegetables, legumes, whole grains, and low-fat dairy products.  ?? Lower the amount of saturated fat in your diet. Saturated fat is found in animal products such as milk, cheese, and meat. Limiting these foods may help you lose weight and also lower your risk for heart disease.  ?? Do not smoke. Smoking increases your risk for heart attack and stroke. If you need help quitting, talk to your doctor about stop-smoking programs and medicines. These can increase your chances of quitting for good.  When should you call for help?  Call 911 anytime you think you may need emergency care. This may mean having symptoms that suggest that your blood pressure is causing a serious heart or blood vessel problem. Your blood pressure may be over 180/110.  ??For example, call 911 if:  ?? ?? You have symptoms of a heart attack. These may include:  ?? Chest pain or pressure, or a strange feeling in the chest.  ?? Sweating.  ?? Shortness of breath.  ?? Nausea or vomiting.  ?? Pain, pressure, or a strange feeling in the back, neck, jaw, or upper belly or in one or both shoulders or arms.  ?? Lightheadedness or sudden  weakness.  ?? A fast or irregular heartbeat.   ?? ?? You have symptoms of a stroke. These may include:  ?? Sudden numbness, tingling, weakness, or loss of movement in your face, arm, or leg, especially on only one side of your body.  ?? Sudden vision changes.  ?? Sudden trouble speaking.  ?? Sudden confusion or trouble understanding simple statements.  ?? Sudden problems with walking or balance.  ?? A sudden, severe headache that is different from past headaches.   ?? ?? You have severe back or belly pain.   ??Do not wait until your blood pressure comes down on its own. Get help right away.  ??Call your doctor now or seek immediate care if:  ?? ?? Your blood pressure is much higher than normal (such as 180/110 or higher), but you don't have symptoms.   ?? ?? You think high blood pressure is causing symptoms, such as:  ?? Severe headache.  ?? Blurry vision.   ??Watch closely for changes in your health, and be sure to contact your doctor if:  ?? ?? Your blood pressure measures 140/90 or higher at least 2 times. That means the top number is 140 or higher or the bottom number is 90 or higher, or both.   ?? ?? You think you may be having side effects from your blood pressure medicine.   ?? ?? Your blood pressure is usually normal, but it goes above normal at least 2 times.   Where can you learn more?  Go to [email or web address] and sign in to your MyChart account. Enter [phone] in the Search Health Information box to learn more about "High Blood Pressure: Care Instructions."     If you do not have an account, please click on the "Sign Up Now" link.  Current as of: March 20, 2016  Content Version: 11.6  ?? 2006-2018 [employer]. Care instructions adapted under license by [facility]. If you have questions about a medical condition or this instruction, always ask your healthcare professional. [employer] disclaims any warranty or liability for your use of this information.

## 2016-10-29 NOTE — Progress Notes (Signed)
Subjective:      Patient ID: Shelly Richard is a 31 y.o. female who presents for who presents for c/o diarrhea x 1 day. Patient denies H/A at present. Patient denies CP/SOB at present.     Patient past medical history, family history, social, and smoking reviewed and updated as pertinent. Health maintenance has been reviewed.     History obtained from the patient.    Chief Complaint   Patient presents with   . Diarrhea     patient here due to diarhea, headache   . Headache   . Nutrition Counseling   . Obesity       Vitals:    10/29/16 1649   BP: 120/60   Site: Left Arm   Position: Sitting   Cuff Size: Large Adult   Pulse: 72   Temp: 98.1 F (36.7 C)   TempSrc: Oral   Weight: 206 lb 9.6 oz (93.7 kg)   Height: 5' 3.5" (1.613 m)       Current Outpatient Prescriptions   Medication Sig Dispense Refill   . metFORMIN, OSM, (FORTAMET) 500 MG extended release tablet TAKE 2 TABLETS BY MOUTH DAILY WITH BREAKFAST 60 tablet 5   . losartan (COZAAR) 25 MG tablet TAKE 1 TABLET BY MOUTH EVERY DAY 30 tablet 5   . simvastatin (ZOCOR) 40 MG tablet TAKE ONE TABLET BY MOUTH DAILY 30 tablet 3   . montelukast (SINGULAIR) 10 MG tablet TAKE 1 TABLET BY MOUTH DAILY 30 tablet 2   . cetirizine (ZYRTEC) 10 MG tablet TAKE 1 TABLET BY MOUTH DAILY 30 tablet 0   . valACYclovir (VALTREX) 500 MG tablet TK 1 T PO D  1   . levothyroxine (SYNTHROID) 50 MCG tablet Take 50 mcg by mouth daily  3   . Cholecalciferol (VITAMIN D3) 2000 units TABS TAKE 1 TABLET BY MOUTH DAILY 100 tablet 0   . SUMAtriptan (IMITREX) 25 MG tablet Take 1 tablet by mouth once as needed for Migraine 30 tablet 1   . Insulin Degludec-Liraglutide (XULTOPHY) 100-3.6 UNIT-MG/ML SOPN Inject 16 units daily (Patient taking differently: Inject 24 Units into the skin Inject 16 units daily) 2 pen 0   . Lactobacillus (PROBIOTIC ACIDOPHILUS) TABS Take 1 tablet by mouth daily 30 tablet 5   . ibuprofen (ADVIL;MOTRIN) 600 MG tablet Take 1 tablet by mouth every 6 hours as needed for Pain 30 tablet  2   . albuterol sulfate HFA 108 (90 Base) MCG/ACT inhaler Inhale 2 puffs into the lungs every 6 hours as needed for Wheezing or Shortness of Breath 1 Inhaler 5   . naproxen (NAPROSYN) 500 MG tablet Take 1 tablet by mouth 2 times daily for 20 doses 20 tablet 0   . famotidine (PEPCID) 20 MG tablet Take 1 tablet by mouth 2 times daily for 4 days 8 tablet 0     No current facility-administered medications for this visit.        Diarrhea    This is a new problem. The current episode started today. The problem occurs 2 to 4 times per day. The problem has been gradually improving. The stool consistency is described as watery. The patient states that diarrhea does not awaken her from sleep. Pertinent negatives include no abdominal pain, increased  flatus, sweats or vomiting. Nothing aggravates the symptoms. There are no known risk factors. She has tried nothing for the symptoms.       Review of Systems   Constitutional: Negative.    Gastrointestinal: Positive for  diarrhea. Negative for abdominal pain, anal bleeding, blood in stool, constipation, flatus, nausea, rectal pain and vomiting.   Skin: Negative.    Psychiatric/Behavioral: Negative.    All other systems reviewed and are negative.      Objective:     Physical Exam   Constitutional: She is oriented to person, place, and time. Vital signs are normal. She appears well-developed and well-nourished. She is active and cooperative.  Non-toxic appearance. She does not have a sickly appearance. She does not appear ill. No distress.   Pulmonary/Chest: Effort normal and breath sounds normal. No accessory muscle usage. No apnea, no tachypnea and no bradypnea. No respiratory distress.   Abdominal: Soft. Normal appearance and bowel sounds are normal. There is no hepatosplenomegaly, splenomegaly or hepatomegaly. There is no tenderness.   Neurological: She is alert and oriented to person, place, and time.   Skin: Skin is warm and dry. No rash noted. She is not diaphoretic. No  erythema. No pallor.   Psychiatric: She has a normal mood and affect. Her behavior is normal. Judgment and thought content normal.   Nursing note and vitals reviewed.      Assessment:   Diarrhea, improving   Headache , improved.  Dietary counseling  Obesity    [name] received counseling on the following healthy behaviors: nutrition, exercise and medication adherence    Patient given educational materials on Diabetes, Nutrition and Exercise    I have instructed [name] to complete a self tracking handout on Blood Sugars  and instructed them to bring it with them to her next appointment.     Discussed use, benefit, and side effects of prescribed medications.  Barriers to medication compliance addressed.  All patient questions answered.  Pt voiced understanding.       Plan:      DIET / EXERCISE    DRINK 64 OUNCES OF WATER DAILY.    TAKE ALL MEDICATIONS DAILY AS PRESCRIBED.    WORK NOTE    CALL 911 OR GO TO THE NEAREST EMERGENCY ROOM FOR CHEST PAIN / SHORTNESS OF BREATH.     RETURN IN 1 MONTH FOR F/U HTN/ DM  / AS NEEDED.

## 2016-11-12 MED ORDER — VALACYCLOVIR HCL 500 MG PO TABS
500 | ORAL_TABLET | ORAL | 5 refills | 30.00000 days | Status: DC
Start: 2016-11-12 — End: 2017-11-16

## 2016-12-18 MED ORDER — MONTELUKAST SODIUM 10 MG PO TABS
10 | ORAL_TABLET | ORAL | 0 refills | 90.00000 days | Status: DC
Start: 2016-12-18 — End: 2017-01-19

## 2017-01-08 MED ORDER — CETIRIZINE HCL 10 MG PO TABS
10 | ORAL_TABLET | ORAL | 0 refills | 30.00000 days | Status: DC
Start: 2017-01-08 — End: 2017-05-25

## 2017-01-22 MED ORDER — MONTELUKAST SODIUM 10 MG PO TABS
10 | ORAL_TABLET | ORAL | 3 refills | 90.00000 days | Status: DC
Start: 2017-01-22 — End: 2018-02-16

## 2017-01-22 MED ORDER — SIMVASTATIN 40 MG PO TABS
40 | ORAL_TABLET | ORAL | 3 refills | Status: DC
Start: 2017-01-22 — End: 2018-11-03

## 2017-02-25 ENCOUNTER — Ambulatory Visit
Admit: 2017-02-25 | Discharge: 2017-02-25 | Payer: PRIVATE HEALTH INSURANCE | Attending: Internal Medicine | Primary: Oncology

## 2017-02-25 ENCOUNTER — Encounter

## 2017-02-25 DIAGNOSIS — E119 Type 2 diabetes mellitus without complications: Secondary | ICD-10-CM

## 2017-02-25 LAB — URINALYSIS
Bilirubin Urine: NEGATIVE
Blood, Urine: NEGATIVE
Glucose, Ur: >=1000 mg/dL — AB
Ketones, Urine: NEGATIVE mg/dL
Leukocyte Esterase, Urine: NEGATIVE
Nitrite, Urine: NEGATIVE
Protein, UA: NEGATIVE mg/dL
Specific Gravity, UA: 1.03 (ref 1.005–1.030)
Urobilinogen, Urine: 0.2 E.U./dL (ref <2.0)
pH, UA: 5.5 (ref 5.0–8.0)

## 2017-02-25 LAB — POCT GLUCOSE: Glucose: 286 mg/dL

## 2017-02-25 LAB — POCT GLYCOSYLATED HEMOGLOBIN (HGB A1C): Hemoglobin A1C: 8.1 %

## 2017-02-25 MED ORDER — SEMAGLUTIDE(0.25 OR 0.5MG/DOS) 2 MG/1.5ML SC SOPN
2 | PEN_INJECTOR | SUBCUTANEOUS | 5 refills | 30.00000 days | Status: DC
Start: 2017-02-25 — End: 2017-05-22

## 2017-02-25 MED ORDER — LEVOTHYROXINE SODIUM 50 MCG PO TABS
50 | ORAL_TABLET | Freq: Every day | ORAL | 3 refills | Status: DC
Start: 2017-02-25 — End: 2017-12-22

## 2017-02-25 NOTE — Progress Notes (Signed)
Vaccine Information Sheet, "Influenza - Inactivated"  given to Shelly Richard, or parent/legal guardian of  Shelly Richard and verbalized understanding.    Patient responses:    Have you ever had a reaction to a flu vaccine?  NO  Are you able to eat eggs without adverse effects?   NO  Do you have any current illness?  NO  Have you ever had Guillian Barre Syndrome?   NO    Flu vaccine given per order. Please see immunization tab.

## 2017-02-25 NOTE — Progress Notes (Signed)
Subjective:      Patient ID: Shelly Richard is a 31 y.o. female.    Has been eating late at night.    Takes meds most days.      Diabetes   She presents for her follow-up diabetic visit. She has type 2 diabetes mellitus. Her disease course has been worsening. There are no hypoglycemic associated symptoms. There are no diabetic associated symptoms. There are no hypoglycemic complications. Symptoms are worsening. Current diabetic treatment includes insulin injections. She is compliant with treatment some of the time. Her weight is stable. She is following a generally unhealthy diet. When asked about meal planning, she reported none. She never participates in exercise. There is no change in her home blood glucose trend. An ACE inhibitor/angiotensin II receptor blocker is being taken. She sees a podiatrist.Eye exam is current.   Hyperlipidemia   This is a chronic problem. The problem is controlled. Recent lipid tests were reviewed and are variable. Exacerbating diseases include diabetes and obesity. There are no known factors aggravating her hyperlipidemia. Current antihyperlipidemic treatment includes diet change and exercise. The current treatment provides moderate improvement of lipids. Compliance problems include adherence to exercise.  Risk factors for coronary artery disease include obesity, dyslipidemia, diabetes mellitus and family history.       Review of Systems   Constitutional: Negative.    HENT: Negative.    Eyes: Negative.    Respiratory: Negative.    Cardiovascular: Negative.    Gastrointestinal: Negative.    Genitourinary: Negative.    Musculoskeletal: Negative.    Skin: Negative.    Neurological: Negative.    Psychiatric/Behavioral: Negative.        Objective:   Physical Exam   Constitutional: She is oriented to person, place, and time. She appears well-developed and well-nourished. No distress.   HENT:   Head: Normocephalic and atraumatic.   Right Ear: External ear normal.   Left Ear: External ear  normal.   Nose: Nose normal.   Mouth/Throat: Oropharynx is clear and moist.   Eyes: Pupils are equal, round, and reactive to light. Conjunctivae and EOM are normal. Left eye exhibits no discharge. No scleral icterus.   Neck: Normal range of motion. No JVD present. No thyromegaly present.   Cardiovascular: Normal rate, regular rhythm and normal heart sounds.  Exam reveals no gallop.    No murmur heard.  Pulmonary/Chest: Effort normal and breath sounds normal. No respiratory distress. She has no wheezes. She has no rales.   Abdominal: Soft. Bowel sounds are normal. She exhibits no mass. There is no tenderness.   Musculoskeletal: Normal range of motion. She exhibits no edema.   Lymphadenopathy:     She has no cervical adenopathy.   Neurological: She is alert and oriented to person, place, and time. She has normal reflexes. No cranial nerve deficit.   Skin: Skin is warm and dry. No erythema.   Psychiatric: She has a normal mood and affect. Her behavior is normal. Judgment and thought content normal.     Current Outpatient Prescriptions on File Prior to Visit   Medication Sig Dispense Refill   . montelukast (SINGULAIR) 10 MG tablet TAKE 1 TABLET BY MOUTH DAILY 30 tablet 3   . simvastatin (ZOCOR) 40 MG tablet TAKE ONE TABLET BY MOUTH DAILY 30 tablet 3   . cetirizine (ZYRTEC) 10 MG tablet TAKE 1 TABLET BY MOUTH DAILY 30 tablet 0   . valACYclovir (VALTREX) 500 MG tablet TAKE 1 TABLET BY MOUTH DAILY 90 tablet 5   .  metFORMIN, OSM, (FORTAMET) 500 MG extended release tablet TAKE 2 TABLETS BY MOUTH DAILY WITH BREAKFAST 60 tablet 5   . losartan (COZAAR) 25 MG tablet TAKE 1 TABLET BY MOUTH EVERY DAY 30 tablet 5   . levothyroxine (SYNTHROID) 50 MCG tablet Take 50 mcg by mouth daily  3   . Cholecalciferol (VITAMIN D3) 2000 units TABS TAKE 1 TABLET BY MOUTH DAILY 100 tablet 0   . SUMAtriptan (IMITREX) 25 MG tablet Take 1 tablet by mouth once as needed for Migraine 30 tablet 1   . Insulin Degludec-Liraglutide (XULTOPHY) 100-3.6  UNIT-MG/ML SOPN Inject 16 units daily (Patient taking differently: Inject 24 Units into the skin Inject 16 units daily) 2 pen 0   . Lactobacillus (PROBIOTIC ACIDOPHILUS) TABS Take 1 tablet by mouth daily 30 tablet 5   . ibuprofen (ADVIL;MOTRIN) 600 MG tablet Take 1 tablet by mouth every 6 hours as needed for Pain 30 tablet 2   . albuterol sulfate HFA 108 (90 Base) MCG/ACT inhaler Inhale 2 puffs into the lungs every 6 hours as needed for Wheezing or Shortness of Breath 1 Inhaler 5   . naproxen (NAPROSYN) 500 MG tablet Take 1 tablet by mouth 2 times daily for 20 doses 20 tablet 0   . famotidine (PEPCID) 20 MG tablet Take 1 tablet by mouth 2 times daily for 4 days 8 tablet 0     No current facility-administered medications on file prior to visit.        Assessment:        Diagnosis Orders   1. Type 2 diabetes mellitus without complication, without long-term current use of insulin ([place])     2. Mixed hyperlipidemia     3. Iron deficiency anemia, unspecified iron deficiency anemia type     4. Nonintractable headache, unspecified chronicity pattern, unspecified headache type             Plan:     continue meds    Change to ozempic 0.5 mg qweek    Continue other meds    Refills    Labs    Return in 2 months        Kean Gautreau Joeseph Amor, MD

## 2017-02-26 LAB — RENAL FUNCTION PANEL
Albumin: 4.4 g/dL (ref 3.4–5.0)
Anion Gap: 14 (ref 3–16)
BUN: 11 mg/dL (ref 7–20)
CO2: 20 mmol/L — ABNORMAL LOW (ref 21–32)
Calcium: 9.3 mg/dL (ref 8.3–10.6)
Chloride: 104 mmol/L (ref 99–110)
Creatinine: 0.6 mg/dL (ref 0.6–1.1)
GFR African American: >60 (ref >60)
GFR Non-African American: >60 (ref >60)
Glucose: 251 mg/dL — ABNORMAL HIGH (ref 70–99)
Phosphorus: 3.6 mg/dL (ref 2.5–4.9)
Potassium: 4.2 mmol/L (ref 3.5–5.1)
Sodium: 138 mmol/L (ref 136–145)

## 2017-02-26 LAB — ALBUMIN/CREATININE RATIO, URINE
Albumin/Creatinine Ratio: 19.2 mg/g (ref 0.0–30.0)
Creatinine, Ur: 166.7 mg/dL (ref 28.0–259.0)
Microalb, Ur: 3.2 mg/dL — ABNORMAL HIGH (ref <2.0)

## 2017-03-03 ENCOUNTER — Encounter

## 2017-03-03 NOTE — Telephone Encounter (Signed)
Patient is requesting to get a different diabete shot medications because the one she is taking is causing her to have side affects. Patient would like a callback. Thanks

## 2017-03-03 NOTE — Telephone Encounter (Signed)
Called patient LVM with referral to Endocrinologist.

## 2017-03-20 MED ORDER — LOSARTAN POTASSIUM 25 MG PO TABS
25 | ORAL_TABLET | ORAL | 5 refills | Status: DC
Start: 2017-03-20 — End: 2017-10-18

## 2017-04-09 MED ORDER — XULTOPHY 100-3.6 UNIT-MG/ML SC SOPN
100-3.6 | PEN_INJECTOR | SUBCUTANEOUS | 5 refills | Status: DC
Start: 2017-04-09 — End: 2018-04-24

## 2017-04-09 NOTE — Telephone Encounter (Signed)
Returned call to patient advised meds have been sent to pharmacy

## 2017-04-09 NOTE — Telephone Encounter (Signed)
Patient requesting a medication refill.  Medication Xultothy Insulin   Pharmacy [facility] ph. [phone]  Last office visit: 02/25/2017  Next office visit: 05/05/2017    Needs this sent to her insurance.  This is the only insulin she can use.  Insurance is [employer].  Needs to be sent like last time to make sure it gets approved.

## 2017-04-19 ENCOUNTER — Encounter

## 2017-04-22 MED ORDER — METFORMIN HCL ER (OSM) 500 MG PO TB24
500 | ORAL_TABLET | ORAL | 3 refills | Status: DC
Start: 2017-04-22 — End: 2018-02-16

## 2017-05-05 ENCOUNTER — Encounter: Attending: Internal Medicine | Primary: Oncology

## 2017-05-22 ENCOUNTER — Ambulatory Visit
Admit: 2017-05-22 | Discharge: 2017-05-22 | Payer: PRIVATE HEALTH INSURANCE | Attending: Internal Medicine | Primary: Oncology

## 2017-05-22 DIAGNOSIS — E119 Type 2 diabetes mellitus without complications: Secondary | ICD-10-CM

## 2017-05-22 LAB — POCT GLYCOSYLATED HEMOGLOBIN (HGB A1C): Hemoglobin A1C: 8.7 %

## 2017-05-22 NOTE — Progress Notes (Signed)
Endocrinology       New Patient Consultation  Marco Collie M.D.  Phone: [phone]   FAX: [phone]       Cicero Duck   Date of Birth:  03-31-1986    Date of Visit:  05/22/2017    Allergies   Allergen Reactions   . Ozempic [Semaglutide] Hives   . Ace Inhibitors    . Lisinopril Other (See Comments)     Swollen lips   . Peanuts [Peanut Oil]      THROAT ITCHES     Outpatient Prescriptions Marked as Taking for the 05/22/17 encounter (Office Visit) with Vella Kohler, MD   Medication Sig Dispense Refill   . metFORMIN, OSM, (FORTAMET) 500 MG extended release tablet TAKE 2 TABLETS BY MOUTH DAILY WITH BREAKFAST 60 tablet 3   . XULTOPHY 100-3.6 UNIT-MG/ML SOPN INJECT 16 UNITS DAILY AS DIRECTED 4 pen 5   . losartan (COZAAR) 25 MG tablet TAKE 1 TABLET BY MOUTH EVERY DAY 30 tablet 5   . levothyroxine (SYNTHROID) 50 MCG tablet Take 1 tablet by mouth daily 30 tablet 3   . montelukast (SINGULAIR) 10 MG tablet TAKE 1 TABLET BY MOUTH DAILY 30 tablet 3   . simvastatin (ZOCOR) 40 MG tablet TAKE ONE TABLET BY MOUTH DAILY 30 tablet 3   . cetirizine (ZYRTEC) 10 MG tablet TAKE 1 TABLET BY MOUTH DAILY 30 tablet 0   . valACYclovir (VALTREX) 500 MG tablet TAKE 1 TABLET BY MOUTH DAILY 90 tablet 5   . Cholecalciferol (VITAMIN D3) 2000 units TABS TAKE 1 TABLET BY MOUTH DAILY 100 tablet 0   . SUMAtriptan (IMITREX) 25 MG tablet Take 1 tablet by mouth once as needed for Migraine 30 tablet 1   . Lactobacillus (PROBIOTIC ACIDOPHILUS) TABS Take 1 tablet by mouth daily 30 tablet 5   . ibuprofen (ADVIL;MOTRIN) 600 MG tablet Take 1 tablet by mouth every 6 hours as needed for Pain 30 tablet 2   . albuterol sulfate HFA 108 (90 Base) MCG/ACT inhaler Inhale 2 puffs into the lungs every 6 hours as needed for Wheezing or Shortness of Breath 1 Inhaler 5         Vitals:    05/22/17 1337   BP: 118/62   Site: Left Upper Arm   Position: Sitting   Cuff Size: Large Adult   Pulse: 98   SpO2: 99%   Weight: 193 lb (87.5 kg)   Height: 5' 3.5" (1.613 m)      Body mass index is 33.65 kg/m.     Wt Readings from Last 3 Encounters:   05/22/17 193 lb (87.5 kg)   02/25/17 198 lb (89.8 kg)   10/29/16 206 lb 9.6 oz (93.7 kg)     BP Readings from Last 3 Encounters:   05/22/17 118/62   02/25/17 120/64   10/29/16 120/60        Past Medical History:   Diagnosis Date   . Asthma    . Diabetes mellitus ([place])    . Hyperlipidemia      Past Surgical History:   Procedure Laterality Date   . CYST REMOVAL       Family History   Problem Relation Age of Onset   . Diabetes Mother    . Diabetes Father      History   Smoking Status   . Never Smoker   Smokeless Tobacco   . Never Used      History   Alcohol Use No  HPI      Parys Elenbaas is a 32 y.o. female who is here for initial evaluation of uncontrolled DM.  Patient is being seen at the request of C Joeseph Amor, MD    Patient has a PMH of Type 2 DM, hypertension, hyperlipidemia, obesity , hypothyroidism.     Diagnosed with Diabetes Mellitus type 2 in 2014.   Course has been variable .  Microvascular complications: No known retinopathy (Last eye exam: 2018)   No know Nephropathy .  No Peripheral neuropathy:    Home regimen:   Metformin ( fortamet ) 1000 mg daily with breakfast  Xultophy 16 units daily.     Previous medications: Victoza ( skin rash ) , Ozempic, trulicity.     Blood glucose trend    She does not test her sugars      Diet: Eats 3 meals/day  Nutrition education: yes  Exercise:       Hyperlipidemia:  She has mixed hyperlipidmeia  On simvastatin 40 mg daily.      Hypertension: She has HTN  On  Losartan 50 mg daily.       Hypothyroidism : she has hypothyroidism since 2012.     On levothyroxine 50 mcg daily.     Takes it first thing in morning on empty stomach with water, waits for 60 mins before eating.     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   Lives with her daughters     Both parents have DM.     Review of system Please see the scanned document filled by the patient, reviewed  by me today.      Physical Exam   Constitutional:  She is oriented to person, place, and time. She appears well-developed and well-nourished.   HENT:   Head: Normocephalic.   Mouth/Throat: Oropharynx is clear and moist.   Eyes: EOM are normal. Right eye exhibits no discharge.   Neck: No thyromegaly present.   Cardiovascular: Normal rate and normal heart sounds.    Pulmonary/Chest: Effort normal and breath sounds normal.   Abdominal: Soft. She exhibits no distension. There is no tenderness.   Musculoskeletal: She exhibits no tenderness.   No ulcer on foot exam  No calus on foot exam   Neurological: She is alert and oriented to person, place, and time.   Normal sensation to 10 grams monofilament testing.  Normal pulses in both feet.      Skin: Skin is warm. No rash noted. She is not diaphoretic.   Psychiatric: Her behavior is normal.         Lab Results   Component Value Date    LABA1C 8.1 02/25/2017           Assessment/Plan      1. Type 2 DM     Azari Janssens is a 32 y.o. female has Type 2 DM with obesity and insulin resistance.  Uncontrolled.     A1c 8.1 % ---> 8.7 %     -Continue fortamet.     -Increase Xultophy to 20 units QHS    Advised to self titrate the insulin herself by 2 units to keep fasting sugars 90-130.     -Needs to start testing her sugars.       Advised follow-up with the ophthalmologist once year.  Urine microalbumin/cr ratio normal in 11/18   Discussed foot care.    2. Hypertension. BP at goal.     3. Hyperlipidemia. On statins.     01/18  HDL 31  LDL 95  TGD 212      4. Hypothyroidism . On levothyroxine.     Check TSH

## 2017-05-27 MED ORDER — CETIRIZINE HCL 10 MG PO TABS
10 | ORAL_TABLET | ORAL | 0 refills | 30.00000 days | Status: DC
Start: 2017-05-27 — End: 2017-07-24

## 2017-05-27 MED ORDER — VITAMIN D3 50 MCG (2000 UT) PO TABS
50 | ORAL_TABLET | ORAL | 0 refills | 30.00000 days | Status: DC
Start: 2017-05-27 — End: 2018-11-03

## 2017-06-26 ENCOUNTER — Ambulatory Visit
Admit: 2017-06-26 | Discharge: 2017-06-26 | Payer: PRIVATE HEALTH INSURANCE | Attending: Internal Medicine | Primary: Oncology

## 2017-06-26 DIAGNOSIS — E119 Type 2 diabetes mellitus without complications: Secondary | ICD-10-CM

## 2017-06-26 LAB — POCT GLUCOSE: Glucose: 124 mg/dL

## 2017-06-26 NOTE — Progress Notes (Signed)
 Subjective:      Patient ID: Shelly Richard is a 32 y.o. female.    Diabetes   She presents for her follow-up diabetic visit. She has type 2 diabetes mellitus. Her disease course has been improving. There are no hypoglycemic associated symptoms. There are no diabetic associated symptoms. There are no hypoglycemic complications. Symptoms are improving. There are no diabetic complications. Risk factors for coronary artery disease include diabetes mellitus, dyslipidemia and obesity. Current diabetic treatment includes insulin  injections and diet. Her weight is stable. She is following a generally unhealthy diet. When asked about meal planning, she reported none. She never participates in exercise. She does not see a podiatrist.Eye exam is current.   Hypertension   This is a chronic problem. The problem is unchanged. The problem is controlled. Past treatments include lifestyle changes and angiotensin blockers. There are no compliance problems.  There is no history of CAD/MI.   Hyperlipidemia   This is a chronic problem. The problem is controlled. Recent lipid tests were reviewed and are low. Exacerbating diseases include diabetes. The current treatment provides significant improvement of lipids. Compliance problems include adherence to diet.  Risk factors for coronary artery disease include diabetes mellitus, dyslipidemia and hypertension.       Review of Systems   Constitutional: Negative.    HENT: Negative.    Eyes: Negative.    Respiratory: Negative.    Cardiovascular: Negative.    Gastrointestinal: Negative.    Genitourinary: Negative.    Musculoskeletal: Negative.    Skin: Negative.    Neurological: Negative.    Psychiatric/Behavioral: Negative.        Objective:   Physical Exam   Constitutional: She is oriented to person, place, and time. She appears well-developed and well-nourished. No distress.   HENT:   Head: Normocephalic and atraumatic.   Right Ear: External ear normal.   Left Ear: External ear normal.    Nose: Nose normal.   Mouth/Throat: Oropharynx is clear and moist.   Eyes: Pupils are equal, round, and reactive to light. Conjunctivae and EOM are normal. Left eye exhibits no discharge. No scleral icterus.   Neck: Normal range of motion. No JVD present. No thyromegaly present.   Cardiovascular: Normal rate, regular rhythm and normal heart sounds. Exam reveals no gallop.   No murmur heard.  Pulmonary/Chest: Effort normal and breath sounds normal. No respiratory distress. She has no wheezes. She has no rales.   Abdominal: Soft. Bowel sounds are normal. She exhibits no mass. There is no tenderness.   Musculoskeletal: Normal range of motion. She exhibits no edema.   Lymphadenopathy:     She has no cervical adenopathy.   Neurological: She is alert and oriented to person, place, and time. She has normal reflexes. No cranial nerve deficit.   Skin: Skin is warm and dry. No erythema.   Psychiatric: She has a normal mood and affect. Her behavior is normal. Judgment and thought content normal.     Current Outpatient Medications on File Prior to Visit   Medication Sig Dispense Refill    cetirizine  (ZYRTEC ) 10 MG tablet TAKE 1 TABLET BY MOUTH DAILY 30 tablet 0    Cholecalciferol (VITAMIN D3) 2000 units TABS TAKE 1 TABLET BY MOUTH DAILY 100 tablet 0    metFORMIN , OSM, (FORTAMET ) 500 MG extended release tablet TAKE 2 TABLETS BY MOUTH DAILY WITH BREAKFAST 60 tablet 3    XULTOPHY  100-3.6 UNIT-MG/ML SOPN INJECT 16 UNITS DAILY AS DIRECTED 4 pen 5    losartan  (COZAAR )  25 MG tablet TAKE 1 TABLET BY MOUTH EVERY DAY 30 tablet 5    levothyroxine  (SYNTHROID ) 50 MCG tablet Take 1 tablet by mouth daily 30 tablet 3    montelukast  (SINGULAIR ) 10 MG tablet TAKE 1 TABLET BY MOUTH DAILY 30 tablet 3    simvastatin  (ZOCOR ) 40 MG tablet TAKE ONE TABLET BY MOUTH DAILY 30 tablet 3    valACYclovir  (VALTREX ) 500 MG tablet TAKE 1 TABLET BY MOUTH DAILY 90 tablet 5    SUMAtriptan  (IMITREX ) 25 MG tablet Take 1 tablet by mouth once as needed for  Migraine 30 tablet 1    Lactobacillus (PROBIOTIC ACIDOPHILUS) TABS Take 1 tablet by mouth daily 30 tablet 5    ibuprofen  (ADVIL ;MOTRIN ) 600 MG tablet Take 1 tablet by mouth every 6 hours as needed for Pain 30 tablet 2    albuterol  sulfate HFA 108 (90 Base) MCG/ACT inhaler Inhale 2 puffs into the lungs every 6 hours as needed for Wheezing or Shortness of Breath 1 Inhaler 5     No current facility-administered medications on file prior to visit.          Assessment:        Diagnosis Orders   1. Type 2 diabetes mellitus without complication, with long-term current use of insulin  ([facility])  POCT Glucose       Hypertension  Stable    Hyperlipidemia on diet          Plan:      Continue meds     Diet    Return in 3 months    Follow up with endocrine        Ashey Tramontana Francis Scurry, MD

## 2017-07-25 MED ORDER — CETIRIZINE HCL 10 MG PO TABS
10 | ORAL_TABLET | ORAL | 0 refills | 30.00000 days | Status: DC
Start: 2017-07-25 — End: 2017-11-16

## 2017-08-19 ENCOUNTER — Encounter: Attending: Internal Medicine | Primary: Oncology

## 2017-10-22 MED ORDER — LOSARTAN POTASSIUM 25 MG PO TABS
25 | ORAL_TABLET | ORAL | 5 refills | Status: DC
Start: 2017-10-22 — End: 2019-12-28

## 2017-11-18 MED ORDER — CETIRIZINE HCL 10 MG PO TABS
10 | ORAL_TABLET | ORAL | 0 refills | 30.00000 days | Status: DC
Start: 2017-11-18 — End: 2017-12-22

## 2017-11-18 MED ORDER — VALACYCLOVIR HCL 500 MG PO TABS
500 | ORAL_TABLET | ORAL | 0 refills | 30.00000 days | Status: DC
Start: 2017-11-18 — End: 2018-03-02

## 2017-11-18 MED ORDER — ACIDOPHILUS PO TABS
ORAL_TABLET | ORAL | 0 refills | 30.00000 days | Status: DC
Start: 2017-11-18 — End: 2017-12-22

## 2017-12-08 NOTE — Telephone Encounter (Signed)
 Received PA request for metFORMIN , OSM, (FORTAMET ) 500 MG extended release tablet. Attempted to submit PA via [facility], and it stated patient no longer has prescription coverage through [employer]. Patients pharmacy has the same prescription insurance can someone from clinic reach out to patient to verify prescription insurance?        Please advise. Thank you.

## 2017-12-08 NOTE — Telephone Encounter (Signed)
 Lm on vm to call ov

## 2017-12-23 MED ORDER — LEVOTHYROXINE SODIUM 50 MCG PO TABS
50 | ORAL_TABLET | Freq: Every day | ORAL | 5 refills | Status: DC
Start: 2017-12-23 — End: 2018-12-24

## 2017-12-23 MED ORDER — ACIDOPHILUS PO TABS
ORAL_TABLET | ORAL | 5 refills | 30.00000 days | Status: DC
Start: 2017-12-23 — End: 2022-05-22

## 2017-12-23 MED ORDER — CETIRIZINE HCL 10 MG PO TABS
10 | ORAL_TABLET | ORAL | 5 refills | 30.00000 days | Status: DC
Start: 2017-12-23 — End: 2019-03-31

## 2018-02-03 ENCOUNTER — Institutional Professional Consult (permissible substitution): Admit: 2018-02-03 | Discharge: 2018-02-03 | Payer: PRIVATE HEALTH INSURANCE | Primary: Oncology

## 2018-02-03 ENCOUNTER — Ambulatory Visit: Admit: 2018-02-03 | Discharge: 2018-02-03 | Payer: PRIVATE HEALTH INSURANCE | Primary: Oncology

## 2018-02-03 DIAGNOSIS — N920 Excessive and frequent menstruation with regular cycle: Secondary | ICD-10-CM

## 2018-02-03 LAB — CBC AND DIFFERENTIAL
Basophils Absolute: 35 cells/uL (ref 0–200)
Basophils Relative: 0.3 %
Eosinophils Absolute: 138 cells/uL (ref 15–500)
Eosinophils Relative: 1.2 %
Hematocrit: 34 % (ref 35.0–45.0)
Hemoglobin: 10.4 g/dL (ref 11.7–15.5)
Lymphocytes Absolute: 2921 cells/uL (ref 850–3900)
Lymphocytes Relative: 25.4 %
MCH: 23.5 pg (ref 27.0–33.0)
MCHC: 30.6 g/dL (ref 32.0–36.0)
MCV: 76.9 fL (ref 80.0–100.0)
Monocytes Absolute: 518 cells/uL (ref 200–950)
Monocytes Relative: 4.5 %
Neutrophils Absolute: 7889 cells/uL (ref 1500–7800)
Neutrophils Relative: 68.6 %
Platelets: 397 10*3/uL (ref 140–400)
RBC: 4.42 10*6/uL (ref 3.80–5.10)
RDW: 18.7 % (ref 11.0–15.0)
WBC: 11.5 10*3/uL (ref 3.8–10.8)

## 2018-02-03 LAB — COMPREHENSIVE METABOLIC PANEL
A/G Ratio: 1.2 (calc) (ref 1.0–2.5)
ALT: 11 U/L (ref 6–29)
AST: 10 U/L (ref 10–30)
Albumin: 4.1 g/dL (ref 3.6–5.1)
Alkaline Phosphatase: 138 U/L (ref 33–115)
BUN: 16 mg/dL (ref 7–25)
CO2: 26 mmol/L (ref 20–32)
Calcium: 9.5 mg/dL (ref 8.6–10.2)
Chloride: 103 mmol/L (ref 98–110)
Creatinine: 0.89 mg/dL (ref 0.50–1.10)
GFR MDRD Af Amer: 99 mL/min/{1.73_m2} (ref >=60)
Globulin, Total: 3.4 g/dL (calc) (ref 1.9–3.7)
Glucose: 422 mg/dL (ref 65–99)
Potassium: 4.5 mmol/L (ref 3.5–5.3)
Sodium: 137 mmol/L (ref 135–146)
Total Bilirubin: 0.3 mg/dL (ref 0.2–1.2)
Total Protein: 7.5 g/dL (ref 6.1–8.1)
eGFR Non-Afr. American: 86 mL/min/{1.73_m2} (ref >=60)

## 2018-02-03 LAB — HEMOGLOBIN A1C: Hemoglobin A1C: 9.6 % of total Hgb (ref <5.7)

## 2018-02-03 LAB — THYROID PEROXIDASE AND THYROGLOBULIN ANTIBODIES
Thyroglobulin Ab: <1 IU/mL (ref <=1)
Thyroid Peroxidase Ab: 1 IU/mL (ref <9)

## 2018-02-03 LAB — DHEA-SULFATE: DHEA Sulfate: 153 ug/dL (ref 23–266)

## 2018-02-03 MED ORDER — letrozole (FEMARA) 2.5 mg tablet
2.5 | ORAL_TABLET | Freq: Every day | ORAL | 11 refills | Status: AC
Start: 2018-02-03 — End: 2018-07-06

## 2018-02-03 MED ORDER — norethindrone (AYGESTIN) 5 mg tablet
5 | ORAL_TABLET | Freq: Every day | ORAL | 11 refills | Status: AC
Start: 2018-02-03 — End: 2019-01-27

## 2018-02-03 NOTE — Progress Notes (Signed)
labs

## 2018-02-03 NOTE — Unmapped (Signed)
CC:  New patient visit, fibroids    HPI: 32 y.o. [id number] female with heavy menstrual bleeding and anemia in the setting of uterine fibroids who presents today to discuss fibroid removal. She also has a history of type 2 diabetes, hypothyroidism, asthma, and HLD.     Has a history of uterine fibroids. Not currently interested in pregnancy. Reports heavy menses and is interested in fibroid removal. Has required iron infusions in the past secondary to anemia from heavy bleeding. Currently on micronor. Bleeding is heavier on CD 2-3. Also tried tranexamic acid but this did not help bleeding. Also reports pain related to her fibroids. Also reports daily, watery discharge.     LMP 01/11/18, 12/16/17 prior to this.     GYNHx:  Regular monthly menses   Reports chin hair   Denies acne  Menarche age 66, regular  Genital herpes - last outbreak in 2013, on valacyclovir   History of gonorrhea and chlamydia in 2012 (prior partner)  Reports dysmenorrhea  Denies dyspareunia     Referral: Self     Partner History: Dellia Nims  Age 73   Has two children (both age 52 - different mothers)  PMH: none  PSH: none  Meds: none  All: none  Sochx: smoker marijuana   Denies issues with erection or ejaculation  General laborer     Pertinent History:  CT a/p 01/2014:  Pelvis: The bladder is decompressed.  The uterus is heterogenous in  attenuation with several intramural and submucosal fibroids noted.  There are  2 cysts arising from the right ovary, measuring 3.1 and 2.6 cm respectively.    PMH:  - Hyperlipidemia: She has mixed hyperlipidmeia  On simvastatin 40 mg daily  - Hypothyroidism : she has hypothyroidism since 2012.   On levothyroxine 50 mcg daily.   - Type 2 DM - on metformin and insulin, on Losartan 50 mg daily for renal protection   - Asthma - Singulair and zyrtec    OB History     Gravida   2    Para   2    Term   2    Preterm        AB        Living   2       SAB        TAB        Ectopic        Multiple        Live Births   2                Past Surgical History:   Procedure Laterality Date   ??? CYST REMOVAL       Current Outpatient Medications   Medication Sig   ??? albuterol Inhale 1 ampule by nebulization every 6 hours as needed.   ??? budesonide-formoterol Inhale 2 puffs into the lungs 2 times a day.   ??? levothyroxine Take 50 mcg by mouth daily.   ??? losartan Take 25 mg by mouth daily.   ??? metFORMIN Take 500 mg by mouth 2 times a day with meals.   ??? montelukast Take 10 mg by mouth at bedtime (2100).   ??? simvastatin Take 40 mg by mouth at bedtime.   ??? valACYclovir Take 500 mg by mouth 2 times a day.   ??? aspirin Take 81 mg by mouth daily.   ??? doxycycline Take 1 capsule (100 mg total) by mouth 2 times a day.   ??? hydrocortisone  Apply topically 2 times a day.   ??? lidocaine Apply topically as needed.   ??? oxyCODONE Take 5 mg by mouth every 4 hours as needed.     No current facility-administered medications for this visit.       reports that she has never smoked. She has never used smokeless tobacco. She reports that she does not drink alcohol or use drugs.    Allergies   Allergen Reactions   ??? Lisinopril      Review of Systems:  The following systems were reviewed and negative, except those noted in HPI: General, cardiovascular, respiratory, gastrointestinal, genitourinary, musculoskeletal, skin, neurologic, psychiatric, endocrine, heme, allergy    Objective:  Vitals:    02/03/18 1536   BP: 118/76   Weight: 205 lb 6.4 oz (93.2 kg)   Height: 5' 4 (1.626 m)     General: No acute distress, alert & oriented x 3  Head: normocephalic, pupils equal  Extremities: no significant edema  Skin: no rash or lesion  Pelvic: deferred    Assessment:   32 y.o. [id number]   Uterine fibroids  PCOS  Menorrhagia  Copious vaginal discharge   Type 2 DM - A1C 8.7 (05/22/2017)  HLD  Hypothyroidism   Not currently interested in fertility     Plan:   -  Labs: TSH, free T4, TPO, TSI, CBC, CMP, hgb A1C, DHEAS, testosterone, PRL, AMH  -  Discussed recommendation for medical management of  bleeding from uterine fibroids prior to pursuing surgical management  -  Start NE 5 mg daily (discontinue micronor) and LE 2.5 mg daily   Start both medications today  -  Previously tried Pharmacist, hospital and tranexamic acid without improvement in bleeding    -  Discussed recommendation for SIS to evaluate if fibroid within or impinging on uterine cavity  -  Plan for pelvic MRI to better characterize uterus/fibroids   -  RTO in 4 weeks for SIS     I spent a total of 50 minutes of which 45 minutes was spent in counseling and/or coordination of care with patient and/or family. Topics discussed include PCOS and menorrhagia    I saw and evaluated the patient. Agree with resident's note.  Will continue per plan as described above.  I performed the entire procedure  Casimiro Needle A. Maisie Fus, M.D.

## 2018-02-04 LAB — POCT THYROID STIMULATING HORMONE (TSH): TSH, POC: 3.8

## 2018-02-04 LAB — POCT ANTI-MULLERIAN HORMONE: POCT - Anti-Mullerian Hormone: 4.29 ng/ml (ref 0.576–8.13)

## 2018-02-04 LAB — POCT - PROLACTIN: POCT - Prolactin: 16.92

## 2018-02-04 LAB — POCT - TESTOSTERONE: POCT - Testosterone: 12.09

## 2018-02-04 LAB — POCT - FREE T4: POCT - Free T4: 0.893

## 2018-02-04 NOTE — Telephone Encounter (Signed)
Sarah calling from the [facility] precert dept. Needs the order for the pelvic MRI as well as clinical documentation to support the MRI order faxed to her at [phone] in order to complete the precert.

## 2018-02-04 NOTE — Telephone Encounter (Signed)
Information has been faxed as requested

## 2018-02-05 NOTE — Telephone Encounter (Signed)
Received call from [facility] with critical result  Glucose 422 on 10/22  Notified patient who reports she stopped her antidiabetic meds due to hives  She has a follow up with her PCP on Nov 4th  I advised that she restart her medication ASAP  She says she is only going to restart one at a time so she can figure out which one cause the hives  I advised that she discuss that with her PCP and to see if she can be seen sooner  Patient verbalized understanding

## 2018-02-08 NOTE — Telephone Encounter (Signed)
Attempted to call pateint about elevated glucose and HbA1C  Phone problems noted with patient

## 2018-02-09 NOTE — Telephone Encounter (Signed)
Sent message back to sarah bader in the pre cert department that MRI was approved.   Auth number [id number]  Call reference is [id number].   Asked Maralyn Sago is there anything else we need to do. Per sarahs message pt has MRI scheduled for tomorrow

## 2018-02-09 NOTE — Telephone Encounter (Signed)
Per sarah, not else needs to be done from our office

## 2018-02-09 NOTE — Telephone Encounter (Signed)
Had peer to peer with insurance company  Approved MRI for fibroids  Had IUD in place    Authorization # is [id number]    Call reference is [id number]

## 2018-02-09 NOTE — Telephone Encounter (Signed)
Left message today  Told to follow up with PCP  Then update [place] about her elevated glucose and HbA1C

## 2018-02-10 ENCOUNTER — Inpatient Hospital Stay
Admit: 2018-02-10 | Payer: PRIVATE HEALTH INSURANCE | Attending: Student in an Organized Health Care Education/Training Program | Primary: Oncology

## 2018-02-10 DIAGNOSIS — D252 Subserosal leiomyoma of uterus: Secondary | ICD-10-CM

## 2018-02-10 MED ORDER — gadobutrol (GADAVIST) 1 mmol/1 mL IV solution 9 mL
1 | Freq: Once | INTRAVENOUS | Status: AC | PRN
Start: 2018-02-10 — End: 2018-02-10
  Administered 2018-02-10: 23:00:00 9 mL/kg via INTRAVENOUS

## 2018-02-10 NOTE — Telephone Encounter (Signed)
 rx for singulair  not approve  Not my pt

## 2018-02-10 NOTE — Telephone Encounter (Signed)
 I cannot refill medications to patients that had not been seen    Sorry

## 2018-02-10 NOTE — Telephone Encounter (Signed)
Please advise, since pt has not been seen by this office yet.

## 2018-02-10 NOTE — Telephone Encounter (Signed)
"  Left message informing pt.   "

## 2018-02-10 NOTE — Telephone Encounter (Signed)
 OV: NP will be seen on 02/16/2018 (has not been seen yet)  CB: [phone]    Patient has not been seen yet, but needs refills on medications.  Prev doctor will not refill the medication.    montelukast  (SINGULAIR ) 10 MG tablet    [facility] test strips    Pharmacy:  [facility] [address] [place] - [address] - P [phone] - F [phone]    Please advise.

## 2018-02-16 ENCOUNTER — Ambulatory Visit
Admit: 2018-02-16 | Discharge: 2018-02-16 | Payer: PRIVATE HEALTH INSURANCE | Attending: Family Medicine | Primary: Oncology

## 2018-02-16 DIAGNOSIS — E119 Type 2 diabetes mellitus without complications: Secondary | ICD-10-CM

## 2018-02-16 MED ORDER — MONTELUKAST SODIUM 10 MG PO TABS
10 | ORAL_TABLET | ORAL | 3 refills | 90.00000 days | Status: DC
Start: 2018-02-16 — End: 2018-08-24

## 2018-02-16 MED ORDER — BLOOD GLUCOSE TEST VI STRP
ORAL_STRIP | 2 refills | 30.00000 days | Status: DC
Start: 2018-02-16 — End: 2019-12-29

## 2018-02-16 MED ORDER — METFORMIN HCL ER (OSM) 500 MG PO TB24
500 | ORAL_TABLET | ORAL | 3 refills | Status: DC
Start: 2018-02-16 — End: 2018-11-03

## 2018-02-16 MED ORDER — ACURA BLOOD GLUCOSE METER W/DEVICE KIT
PACK | 0 refills | 30.00000 days | Status: AC
Start: 2018-02-16 — End: ?

## 2018-02-16 MED ORDER — LANCETS MISC
Freq: Every day | 3 refills | 30.00000 days | Status: AC
Start: 2018-02-16 — End: ?

## 2018-02-16 NOTE — Telephone Encounter (Signed)
 Please advise.

## 2018-02-16 NOTE — Telephone Encounter (Signed)
 Emily:[facility]    [phone]    blood glucose monitor strips    How often should the patient be testing her BS? Pharmacy would like to verify since directions are not clear.    Please advise.     OV: 02/16/2018

## 2018-02-16 NOTE — Telephone Encounter (Signed)
qam

## 2018-02-16 NOTE — Telephone Encounter (Signed)
Left message informing pharm.

## 2018-02-17 NOTE — Progress Notes (Signed)
 Subjective:      Patient ID: Shelly Richard is a 32 y.o. female.    HPI     Here To become established  Treatment Adherence:   Medication compliance:  compliant most of the time  she decided to increase her metformin  to 1000 p.o. twice daily the original prescription was written for 500 p.o. twice daily  Diet compliance:  noncompliant:  I will try again  Weight trend: stable  Current exercise: no regular exercise  Barriers: none    Diabetes Mellitus Type 2: Current symptoms/problems include none.    Home blood sugar records: fasting range: Occasional 180s in the morning  Any episodes of hypoglycemia? no  Eye exam current (within one year): yes  Tobacco history: She  reports that she has never smoked. She has never used smokeless tobacco.   Daily Aspirin ? No: recommended     Hypertension:  Home blood pressure monitoring: No.  She is not adherent to a low sodium diet. Patient denies chest pain, shortness of breath, headache, lightheadedness, blurred vision, peripheral edema, palpitations, dry cough and fatigue.  Antihypertensive medication side effects: no medication side effects noted.  Use of agents associated with hypertension: none.     Hyperlipidemia:  No new myalgias or GI upset on simvastatin  (Zocor ).       Lab Results   Component Value Date    LABA1C 8.7 05/22/2017    LABA1C 8.1 02/25/2017    LABA1C 8.0 09/24/2016     Lab Results   Component Value Date    LABMICR 3.20 (H) 02/25/2017    LABMICR Not Indicated 02/25/2017    CREATININE 0.6 02/25/2017     Lab Results   Component Value Date    ALT 11 04/24/2016    AST 13 (L) 04/24/2016     Lab Results   Component Value Date    CHOL 168 04/24/2016    TRIG 212 (H) 04/24/2016    HDL 31 (L) 04/24/2016    LDLCALC 95 04/24/2016        Urticaria  On and off hives that can last from hours to sometimes a day  There is no specific triggers  Symptoms related with this rash or itching, and occasional fatigue  Has not tried any medications for it    Mild intermittent  asthma  Symptoms are well controlled now  Is refill on Singulair   Uses albuterol  maybe once or twice per month    Allergies  Strongly allergic to peanuts, soy beans and walnuts  Currently she feels mild stuffy nose but no pain  Does not take any medications    Hypothyroidism:  Now on replacement therapy, denies any fatigue, slow thought process, tremor, hair loss, diaphoresis, heat/cold intolerance, wt gain/loss, diarrhea/constipation.       Review of Systems  Above  is allergic to ozempic  [semaglutide ]; ace inhibitors; lisinopril; and peanuts [peanut oil].      Current Outpatient Medications:     montelukast  (SINGULAIR ) 10 MG tablet, TAKE 1 TABLET BY MOUTH DAILY, Disp: 30 tablet, Rfl: 3    Lancets MISC, 1 each by Does not apply route daily Please provide lancets approved by insurance., Disp: 100 each, Rfl: 3    Blood Glucose Monitoring Suppl (ACURA BLOOD GLUCOSE METER) w/Device KIT, Please provide meter approved by insurance. Check bs qam, Disp: 1 kit, Rfl: 0    blood glucose monitor strips, Tes2t qam  times a day & as needed for symptoms of irregular blood glucose. Please provide test strips approved by  insurance., Disp: 100 strip, Rfl: 2    metFORMIN , OSM, (FORTAMET ) 500 MG extended release tablet, TAKE 2 TABLETS BY MOUTH DAILY WITH BREAKFAST and 2 with dinner, Disp: 60 tablet, Rfl: 3    levothyroxine  (SYNTHROID ) 50 MCG tablet, TAKE 1 TABLET BY MOUTH DAILY, Disp: 30 tablet, Rfl: 5    cetirizine  (ZYRTEC ) 10 MG tablet, TAKE 1 TABLET BY MOUTH DAILY, Disp: 30 tablet, Rfl: 5    valACYclovir  (VALTREX ) 500 MG tablet, TAKE 1 TABLET BY MOUTH DAILY, Disp: 90 tablet, Rfl: 0    losartan  (COZAAR ) 25 MG tablet, TAKE 1 TABLET BY MOUTH EVERY DAY, Disp: 30 tablet, Rfl: 5    Cholecalciferol (VITAMIN D3) 2000 units TABS, TAKE 1 TABLET BY MOUTH DAILY, Disp: 100 tablet, Rfl: 0    XULTOPHY  100-3.6 UNIT-MG/ML SOPN, INJECT 16 UNITS DAILY AS DIRECTED, Disp: 4 pen, Rfl: 5    simvastatin  (ZOCOR ) 40 MG tablet, TAKE ONE TABLET BY  MOUTH DAILY, Disp: 30 tablet, Rfl: 3    ibuprofen  (ADVIL ;MOTRIN ) 600 MG tablet, Take 1 tablet by mouth every 6 hours as needed for Pain, Disp: 30 tablet, Rfl: 2    albuterol  sulfate HFA 108 (90 Base) MCG/ACT inhaler, Inhale 2 puffs into the lungs every 6 hours as needed for Wheezing or Shortness of Breath, Disp: 1 Inhaler, Rfl: 5    Lactobacillus (ACIDOPHILUS) TABS, TAKE 1 TABLET BY MOUTH EVERY DAY (Patient not taking: Reported on 02/16/2018), Disp: 30 tablet, Rfl: 5    SUMAtriptan  (IMITREX ) 25 MG tablet, Take 1 tablet by mouth once as needed for Migraine (Patient not taking: Reported on 02/16/2018), Disp: 30 tablet, Rfl: 1     has a past medical history of Allergies, Asthma, Diabetes mellitus (HCC), Genital herpes, HTN (hypertension), Hyperlipidemia, Hypothyroid, and Urticaria.    Past Surgical History:   Procedure Laterality Date    CYST REMOVAL          reports that she has never smoked. She has never used smokeless tobacco. She reports that she does not drink alcohol or use drugs.    family history includes Diabetes in her father and mother.    Objective:  Blood pressure 116/64, pulse 79, temperature 98.2 F (36.8 C), temperature source Tympanic, resp. rate 16, height 5' 4 (1.626 m), weight 201 lb (91.2 kg), last menstrual period 02/12/2018, SpO2 98 %.       Physical Exam   Constitutional: She is oriented to person, place, and time. She appears well-developed and well-nourished. No distress.   HENT:   Head: Normocephalic.   Right Ear: External ear normal.   Left Ear: External ear normal.   Mouth/Throat: Oropharynx is clear and moist. No oropharyngeal exudate.   Eyes: Pupils are equal, round, and reactive to light. Conjunctivae and EOM are normal. Right eye exhibits no discharge. Left eye exhibits no discharge. No scleral icterus.   Neck: Normal range of motion. Neck supple. No JVD present. No thyromegaly present.   No carotid bruits     Cardiovascular: Normal rate, regular rhythm and intact distal pulses.  Exam reveals no gallop and no friction rub.   Murmur heard.  Pulses:       Dorsalis pedis pulses are 2+ on the right side, and 2+ on the left side.        Posterior tibial pulses are 2+ on the right side, and 2+ on the left side.   No edema     Pulmonary/Chest: Effort normal and breath sounds normal. No stridor. No respiratory distress. She  has no wheezes. She has no rales. She exhibits no tenderness.   Abdominal: Soft. Bowel sounds are normal. She exhibits no distension and no mass. There is no tenderness. There is no rebound and no guarding. No hernia.   Musculoskeletal: She exhibits no edema or tenderness.   Lymphadenopathy:     She has no cervical adenopathy.   Neurological: She is alert and oriented to person, place, and time. She has normal reflexes. She displays normal reflexes. No cranial nerve deficit or sensory deficit. She exhibits normal muscle tone. Coordination normal.   Skin: Skin is warm. Capillary refill takes less than 2 seconds. No rash noted. She is not diaphoretic. No erythema. No pallor.   Psychiatric: She has a normal mood and affect. Her behavior is normal. Judgment and thought content normal.   Nursing note and vitals reviewed.      Assessment:       Diagnosis Orders   1. Type 2 diabetes mellitus without complication, with long-term current use of insulin  (HCC)  Lancets MISC    Blood Glucose Monitoring Suppl (ACURA BLOOD GLUCOSE METER) w/Device KIT    blood glucose monitor strips    metFORMIN , OSM, (FORTAMET ) 500 MG extended release tablet    Comprehensive Metabolic Panel    Hemoglobin A1C    Microalbumin / Creatinine Urine Ratio   2. Urticaria  AFL - Thornell Carrier, MD, Allergy & Immunology, [place]   3. Mixed hyperlipidemia     4. Acquired hypothyroidism  TSH without Reflex   5. Mild intermittent asthma without complication  montelukast  (SINGULAIR ) 10 MG tablet   6. Allergic rhinitis, unspecified seasonality, unspecified trigger  montelukast  (SINGULAIR ) 10 MG tablet   7.  Heart murmur  ECHO 2D WO Color Doppler Complete           Plan:      New pt  1.  Initial blood work  Adjust medications if needed  Follow-up in 3 months  Eye exam is up-to-date  Aspirin  81 mg/day  Continue ARB\statin    2.  Takes Zyrtec  10 mg twice daily  Refer to allergist    3.  Continue statin    4.  Check TSH  And adjust medication if needed    5.  Well-controlled, refill Singulair     6.  Continue singular symptoms worsen add Flonase    7 .  Check echo    [place] received counseling on the following healthy behaviors: nutrition, exercise and medication adherence    Patient given educational materials on Diabetes, Hyperlipidemia, Nutrition, Exercise and Hypertension    I have instructed [place] to complete a self tracking handout on Blood Sugars , Blood Pressures  and Weights and instructed them to bring it with them to her next appointment.     Discussed use, benefit, and side effects of prescribed medications.  Barriers to medication compliance addressed.  All patient questions answered.  Pt voiced understanding.     Time face to face >40 minutes, 50% time spent in education and coordination of care  Topics discussed:   Medical condition, diff diagnoses, work up options, medication use/secondary effects/medication interactions, and life style changes             Lazaro KANDICE Basil, MD

## 2018-02-19 MED ORDER — METFORMIN HCL 500 MG PO TABS
500 | ORAL_TABLET | Freq: Two times a day (BID) | ORAL | 1 refills | Status: DC
Start: 2018-02-19 — End: 2018-12-23

## 2018-02-19 NOTE — Telephone Encounter (Signed)
 Please advise.

## 2018-02-19 NOTE — Telephone Encounter (Signed)
Insurance is not covering this medication. Patient also had a reaction with this medication;    metFORMIN, OSM, (FORTAMET) 500 MG extended release tablet    Patient is requesting a prescription for regular metFORMIN.     Please advise.     OV: 02/16/2018  CB: [phone]

## 2018-02-20 ENCOUNTER — Encounter

## 2018-02-21 ENCOUNTER — Encounter

## 2018-02-21 LAB — COMPREHENSIVE METABOLIC PANEL
ALT: 13 U/L (ref 10–40)
AST: 13 U/L — ABNORMAL LOW (ref 15–37)
Albumin/Globulin Ratio: 1.7 (ref 1.1–2.2)
Albumin: 4.7 g/dL (ref 3.4–5.0)
Alkaline Phosphatase: 134 U/L — ABNORMAL HIGH (ref 40–129)
Anion Gap: 17 — ABNORMAL HIGH (ref 3–16)
BUN: 10 mg/dL (ref 7–20)
CO2: 21 mmol/L (ref 21–32)
Calcium: 9.2 mg/dL (ref 8.3–10.6)
Chloride: 100 mmol/L (ref 99–110)
Creatinine: 0.6 mg/dL (ref 0.6–1.1)
GFR African American: >60 (ref >60)
GFR Non-African American: >60 (ref >60)
Globulin: 2.7 g/dL
Glucose: 292 mg/dL — ABNORMAL HIGH (ref 70–99)
Potassium: 4.4 mmol/L (ref 3.5–5.1)
Sodium: 138 mmol/L (ref 136–145)
Total Bilirubin: 0.4 mg/dL (ref 0.0–1.0)
Total Protein: 7.4 g/dL (ref 6.4–8.2)

## 2018-02-21 LAB — ALBUMIN/CREATININE RATIO, URINE
Albumin/Creatinine Ratio: 41.3 mg/g — ABNORMAL HIGH (ref 0.0–30.0)
Creatinine, Ur: 67.8 mg/dL (ref 28.0–259.0)
Microalb, Ur: 2.8 mg/dL — ABNORMAL HIGH (ref <2.0)

## 2018-02-21 LAB — LIPID PANEL
Cholesterol, Total: 192 mg/dL (ref 0–199)
HDL: 33 mg/dL — ABNORMAL LOW (ref 40–60)
LDL Calculated: 129 mg/dL — ABNORMAL HIGH (ref <100)
Triglycerides: 148 mg/dL (ref 0–150)
VLDL Cholesterol Calculated: 30 mg/dL

## 2018-02-21 LAB — TSH: TSH: 3.03 u[IU]/mL (ref 0.27–4.20)

## 2018-02-21 NOTE — Telephone Encounter (Signed)
Left message  MRI shows 9 cm submucous fibroid  Will do DL for shrinkage and do HSC in 3-4 months  No need for SIS

## 2018-02-22 LAB — HEMOGLOBIN A1C
Estimated Avg Glucose: 240.3 mg/dL
Hemoglobin A1C: 10 %

## 2018-02-24 ENCOUNTER — Encounter

## 2018-02-24 NOTE — Telephone Encounter (Signed)
 Please advise.

## 2018-02-24 NOTE — Telephone Encounter (Signed)
[place]. NP °

## 2018-02-24 NOTE — Telephone Encounter (Signed)
 Attempted to reach pt, full.   Pt also has test results.

## 2018-02-24 NOTE — Telephone Encounter (Signed)
 Spoke with pt, she ended up going to [employer] in [place].   Pt also informed of test results.

## 2018-02-24 NOTE — Telephone Encounter (Signed)
 ov: 02/16/2018  cb: [phone] (M)    Patient wanted to be scheduled for a sick visit. She states she took off work in hopes that an appt would be available. Patient has had diarrhea since 4:30 AM this morning.    Patient is willing to be seen by a NP today.    Please advise.     In addition, please call the patient to discuss lab results.

## 2018-02-27 ENCOUNTER — Inpatient Hospital Stay: Admit: 2018-02-27 | Payer: PRIVATE HEALTH INSURANCE | Primary: Oncology

## 2018-02-27 DIAGNOSIS — R011 Cardiac murmur, unspecified: Secondary | ICD-10-CM

## 2018-02-27 LAB — ECHOCARDIOGRAM COMPLETE 2D W DOPPLER W COLOR: Left Ventricular Ejection Fraction: 58

## 2018-03-03 MED ORDER — VALACYCLOVIR HCL 500 MG PO TABS
500 | ORAL_TABLET | ORAL | 5 refills | 15.00000 days | Status: DC
Start: 2018-03-03 — End: 2024-04-20

## 2018-03-05 NOTE — Progress Notes (Signed)
Received denial of lupron depot due to NOT having a proposed date of planned leiomyoma or fibroid surgery.

## 2018-03-17 ENCOUNTER — Ambulatory Visit: Admit: 2018-03-17 | Discharge: 2018-03-17 | Payer: PRIVATE HEALTH INSURANCE | Primary: Oncology

## 2018-03-17 DIAGNOSIS — E119 Type 2 diabetes mellitus without complications: Secondary | ICD-10-CM

## 2018-03-17 LAB — HEMOGLOBIN A1C: Hemoglobin A1C: 8.3 % of total Hgb (ref <5.7)

## 2018-03-17 NOTE — Unmapped (Signed)
Surgery/Procedure Scheduling Form    MERIDA ALCANTAR   [address]  [place] [address]     Date of Birth:  Dec 05, 1985  MRN:  [id number]   Sex:  female  Phone Number:  [phone] (home)       PROCEDURE INFORMATION    Procedure Date:  April 14  Alternate Date:      Surgery Time:  1:30 pm   Length of Surgery:  1 minutes    PROCEDURE:  HSC resection of fibroid  DIAGNOSIS:  Submucous fibroids and menorrhagia    Size/Depth:  9 cm  Anatomical Location:  Endo cavity    Surgeon:  Maisie Fus   Co-Surgeon:      Assistant:     Resident:  Jerolyn Shin STATUS    Facility:  [facility] Type:  Private    Patient will go to:  PACU    Admit Status:  Outpatient    ANESTHESIA INFORMATION    Anesthesia Type:  General    Post-Op Epidural:  No    TAP Blocks:  No    Position:  Lithotomy    Special Equipment/Supplies Needed:  Myosure    Allergies   Allergen Reactions   ??? Lisinopril        Latex Sensitive:  No    PRE-ADMISSION TESTING    Patient does not need PAT appointment.    Pre-Admission Testing done at:    Date:    Time:    H&P Appointment at:    Date:   Time:      INSURANCE INFORMATION      PRIMARY INSURANCE   Payor: @RFLCVGPAYOR @  Plan: @RFLCVGPLAN @    Group Number: @[id number] @  Insurance Type:    Subscriber Name: @SUBSCRIBERNAME @  Subscriber DOB:     Subscriber ID: @RFLCVGMEMNUM @  Pat. Rel. to Subscriber:    SECONDARY INSURANCE   Payor:  Plan:    Group Number:  Insurance Type:    Subscriber Name:  Subscriber DOB:    Subscriber ID: @SUBIDSECONDARY @  Pat. Rel. to Subscriber:

## 2018-03-17 NOTE — Unmapped (Signed)
Comes for follow up.   Noted to have 9 cm submucosal fibroid on MRI.  Plan for depo Lupron to shrink fibroid with HSC myomectomy in 3-4 months.    On NE 5 mg and LE 2.5 mg daily.   A1C 9.6 most recently.     Pelvic MRI 02/10/18:  FINDINGS:  Uterus: Uterus measures 14.4 x 10.2 x 10.1 cm. There is a large anterior submucosal fibroid measuring 8.2 x 8.4 x 8.9 cm (series 5 image 20 and series 6 image 24) that results in distortion of the endometrial canal, which is not abnormally thickened. The fibroid enhances homogeneously. There is another intramural fibroid in the anterior uterine body measuring 3.1 x 2.6 x 1.8 cm (series 5 image 31 and series 6 image 27).   ??  Ovaries: The right ovary is normal and measures 3.4 x 2.4 x 1.9 cm. Adjacent to the right ovary, there is a 3 x 2.6 x 3.2 cm unilocular paraovarian cyst.  The left ovary measures 2.6 x 2.1 x 3.1 cm and contains a 1.8 cm cyst with dependent T2 hypointense and T1 hyperintense debris, likely an involuting hemorrhagic cyst.     Pertinent History:  AUB secondary to uterine fibroids  Anemia needing iron infusions in past   Not currently interested in fertility     PMH:  - Hyperlipidemia: She has mixed hyperlipidmeia  On simvastatin 40 mg daily  - Hypothyroidism : she has hypothyroidism since 2012.   On levothyroxine 50 mcg daily.   - Type 2 DM - on metformin and insulin, on Losartan 50 mg daily for renal protection   - Asthma - Singulair and zyrtec    Review of Systems:  The following systems were reviewed and negative, except those noted in HPI: General, cardiovascular, respiratory, gastrointestinal, genitourinary, musculoskeletal, skin, neurologic, psychiatric, endocrine, heme, allergy    Objective:  There were no vitals filed for this visit.  General: No acute distress, alert & oriented x 3  Head: normocephalic, pupils equal  Extremities: no significant edema  Skin: no rash or lesion  Pelvic: deferred     Results Review:     Lab Results   Component Value Date     HGB 10.4 (L) 02/03/2018    HCT 34.0 (L) 02/03/2018    CREATININE 0.89 02/03/2018    POCTAMH 4.29 02/04/2018    POCTPRLCTIN 16.92 02/04/2018    POCTSH 3.80 02/04/2018    HGBA1C 9.6 (H) 02/03/2018     Assessment:   32 y.o. [id number]   Uterine fibroids  PCOS  Menorrhagia  Type 2 DM - A1C 9.6 (01/2018)  HLD  Hypothyroidism   Not currently interested in fertility     Plan:   Shrink with lupron for 3-4 months - resubmit to insurance (previously denied for no planned surgery date)  Plan for [facility] - myomectomy April 14th, 2020 [facility])  Discussed probable need for more than one surgery  Repeat A1C, restarted insulin last month - continue to optimize control of diabetes  Work on diet and exercise    I spent a total of 20 minutes of which 15 minutes was spent in counseling and/or coordination of care with patient and/or family. Topics discussed include submucous fibroid and menorrhagia    I saw and evaluated the patient. Agree with resident's note.  Will continue per plan as described above.  I performed the entire procedure  Casimiro Needle A. Maisie Fus, M.D.

## 2018-03-24 NOTE — Telephone Encounter (Signed)
Left message for pt regarding depot lupron. [employer] denied. Followed an appeal form which can only be done via fax. Turn around time is 15 days. Will be in contact with her once I have communication from [employer]. Pt to call back with any questions.

## 2018-03-24 NOTE — Progress Notes (Signed)
Faxed appeal form, office notes, surgery form all over to [employer]. Waiting for a response from the appeals department. Could not do one over the phone. Had to fax or postal mail.

## 2018-04-24 MED ORDER — XULTOPHY 100-3.6 UNIT-MG/ML SC SOPN
100-3.6 | SUBCUTANEOUS | 2 refills | Status: DC
Start: 2018-04-24 — End: 2019-01-04

## 2018-04-28 NOTE — Unmapped (Signed)
Please call patient and let her know she will need the day of surgery and typically the day after off.

## 2018-04-28 NOTE — Unmapped (Signed)
Pt returning call and has questions regarding what she needs to do  Reviewed message with pt  Pt verbalized understanding   Pt asking how much time she needs to be off work for upcoming [employer] [facility] myomectomy

## 2018-04-28 NOTE — Unmapped (Signed)
Left message for pt. I just received a fax from [employer] regarding the appeal I had filed for the patient. They need a verbal request for the appeal by the patient. Pt needs to call [phone] to do this. informed her I also called [employer] to ask for an extension to try to do this appeal so the patient has time to call them. Left the appeal ID # [id number]. Pt must do this on her end to complete/finish the appeal process.   I have faxed over information(medical records) regarding the extension

## 2018-04-28 NOTE — Unmapped (Signed)
Spoke with patient, advised that she will typically need day of and day after surgery off from work. Patient asking if she could possible take a month off because she 'needs a break'. Advised that time off is based on medical necessity and should be discussed with Dr Maisie Fus at her next visit, and will need pre-op appointment 1 week before surgery.

## 2018-04-28 NOTE — Unmapped (Signed)
Left message  Told that we usually give patients one week off at the most for a HSC resection of fibroid

## 2018-04-29 NOTE — Unmapped (Signed)
Pt calling to let us know that today [employer] told her that she was approved for the [place] Lupron.   She asks that we call them to confirm and then let her know of next steps in this process.     Pt would also like to know if she should stop her other medications once she gets the Lapron?  Meds are: femara and aygestin.        Danley Danker, RN  [facility]

## 2018-04-29 NOTE — Unmapped (Signed)
Pt advised to call pharmacy thru [employer] and arrange shipment but patients wants to know whether she should continue Femara and Aygestin. Please advise

## 2018-04-29 NOTE — Unmapped (Signed)
Dr. Maisie Fus,   Do you want her to continue the medication until she gets her Lupron injection?

## 2018-04-30 NOTE — Unmapped (Signed)
Per Dr. Maisie Fus, patient to continue her medications until she is able to receive the Lupron injection.     Please call her today and let her know.

## 2018-04-30 NOTE — Unmapped (Addendum)
Tried calling patient and phone was busy. If patient does call back please advise what Dr. Francee Gentile said in her note:  Per Dr. Maisie Fus, patient to continue her medications until she is able to receive the Lupron injection.

## 2018-05-08 MED ORDER — leuprolide (LUPRON) 3.75 mg injection
3.75 | PACK | INTRAMUSCULAR | 5 refills | Status: AC
Start: 2018-05-08 — End: 2018-11-17

## 2018-05-08 NOTE — Unmapped (Signed)
Pt is calling to let us know that she was informed Lupron appeal was approved.   She would like Rx to be sent in so she can get this ASAP.  Pt also c/o increased pain recently.   She usually only has bad pain/cramping with cycle. She is not due for her cycle for another couple weeks.  Should she come back in to be seen for increased pain?    Please review and advise.       Danley Danker, RN  [facility]

## 2018-05-08 NOTE — Unmapped (Signed)
Called patient. Complains if increased pain associated with fibroids/menses. She is currently on letrozole and aygestin with plan to transition to lupron (med order signed). Denies peritoneal symptoms. Advised she try motrin 600 mg q6h plus tylenol extra strength 3-4 times per day for the next 3-5 days for acute increase in pain. Reviewed signs/sxs of peritoneal abdomen that would warrant immediate ER visit, same for bleeding. Currently has none. Patient comfortable with plan. Will update [place] if no improvement in pain or go to ER for severe pain.

## 2018-05-08 NOTE — Unmapped (Signed)
Spoke to pt. Pt did not know which mail order the DL needed to go. Informed her I will call [employer] to see where it needs to go.pt would like to talk to a provider regarding the pain. Tried to offer appt but she rather talk to someone first.   Informed her I will forward her message to a provider.       Called [employer]-spoke to vanessa at [employer]. DL [facility].  Will pend medication for approval.         [facility] phone # [phone]  Fax [phone]

## 2018-05-25 NOTE — Unmapped (Signed)
PA request and clinical ssent to [employer] via [employer]Meds. Called patient, left message that it has been submitted and will call her once determination has been made.

## 2018-05-25 NOTE — Unmapped (Signed)
Pt states that she received a call from [facility] pharmacy on Friday and was told that her Lupron Rx still requires PA.  Pt calling to follow up and request update.

## 2018-05-25 NOTE — Unmapped (Signed)
reviewed Ovidio Kin, MA message with pt regarding PA for Lupron

## 2018-06-05 NOTE — Unmapped (Signed)
Called and spoke to [place]. After 35 minutes she found the appeal ID.   Informed her we have sent this twice with clinical information and the surgery date which was circled for them so they could not miss it. She states nothing was ever received so its still denied.   Confirmed fax number again with her.   Had a success confirmation last time it was sent on 03/24/18.   Will send again to [phone]

## 2018-06-05 NOTE — Unmapped (Signed)
Pt calling back. She states she was on phone with [employer]. She states they told her that we need to appeal over the phone. There number is [phone].   It is missing date of surgery which is why the PA was denied.

## 2018-06-05 NOTE — Unmapped (Signed)
Pt calling because she was out of town and just got back. She opened letter from [employer] and found out that Lupron denied again. Asking if we were aware and if we are taking care of it.   Please call patient once we have reviewed and taken care of this.

## 2018-06-05 NOTE — Unmapped (Signed)
Left patient a message to give the office a call back.

## 2018-06-05 NOTE — Unmapped (Signed)
Left message for pt informing her of the situation. refaxed information again. Circled surgery date. Try to make it as obvious as possible for [employer]. Apologized to pt. Will send message to dr Maisie Fus to let him know. Pt to call back if she needs anything else.         Dr Maisie Fus- we have been trying to get this approved since 02/2018-03/2018. I am not sure what else to do. We are at the mercy of [employer]. This will be the 3rd attempt getting them this information that they cant seem to find on their end   pts surgery is scheduled for 07/2018. im not sure what else to do with this.     Just fyi

## 2018-06-05 NOTE — Unmapped (Signed)
Talked to patient  Never got DL pretreatment  Surgery scheduled for April 7  To under [facility] of fiboird  Will see for pre op 2 weeks prior to surgery

## 2018-06-16 NOTE — Unmapped (Signed)
Informed pt I have sent the 3rd attempt for lupron. Waiting on a response from [employer].     Scheduled pre op for pt.     Will let dr Maisie Fus know we still have no response on lupron. Unsure on what he would like to do considering surgery is next month.     Please advise

## 2018-06-16 NOTE — Unmapped (Signed)
Pt asking for updated on PA for Lupron  Also asking if she needs to make any pre op appt

## 2018-06-22 MED ORDER — SODIUM CHLORIDE 0.9 % IJ SOLN
0.9 | INTRAMUSCULAR | Status: DC | PRN
Start: 2018-06-22 — End: 2018-06-22

## 2018-06-25 NOTE — Telephone Encounter (Signed)
 Re-eval advised today since not improving.  Advise to return to the same medical facility where she was diagnosed/treated.  F/u with Dr. Esmeralda after discharge from that facility.

## 2018-06-25 NOTE — Telephone Encounter (Signed)
Please advise

## 2018-06-25 NOTE — Telephone Encounter (Signed)
Discussed with patient  Close Encounter

## 2018-06-25 NOTE — Telephone Encounter (Signed)
 Pt called stating she was diagnosis Type B on 06/15/2018 still have a fever.103.6 have been the highest. Currently at 102.9. Can not break the fever and needs to be fever free for 24-hours to return to work       Please advise Deneka [phone]

## 2018-06-27 NOTE — Unmapped (Signed)
Talked to patient  No bleeding on NETA  Will reschedule surgery at a later date

## 2018-06-29 ENCOUNTER — Telehealth

## 2018-06-29 NOTE — Telephone Encounter (Signed)
 Patient was seen and diagnosed with the flu on Monday March 2nd and she has since been seen at the ER because she is unable to get the fever to break. Patient would like to be seen here to find out why she is unable to get the fever down. Patient was given nothing at [facility], and then at the ER patient was given, fluids, tylenol , and tramidol. Patients fever has been between 99.4-103.6 this morning it is 101.8.  Please advise  [place] [phone]

## 2018-06-29 NOTE — Telephone Encounter (Signed)
 Please advise.

## 2018-06-29 NOTE — Telephone Encounter (Signed)
 Left detailed message with doctor's instructions.  Close Encounter

## 2018-06-29 NOTE — Telephone Encounter (Signed)
Get XR   Order in chart       If sob go to ED

## 2018-06-29 NOTE — Telephone Encounter (Signed)
Patient is calling stating that she just had a chest x-ray done last week in the hospital and she would like to know why she needs a second one.  Please advise

## 2018-06-30 ENCOUNTER — Encounter: Payer: PRIVATE HEALTH INSURANCE | Primary: Oncology

## 2018-06-30 ENCOUNTER — Encounter

## 2018-06-30 ENCOUNTER — Inpatient Hospital Stay: Payer: PRIVATE HEALTH INSURANCE | Primary: Oncology

## 2018-06-30 ENCOUNTER — Inpatient Hospital Stay: Admit: 2018-06-30 | Payer: PRIVATE HEALTH INSURANCE | Primary: Oncology

## 2018-06-30 DIAGNOSIS — J111 Influenza due to unidentified influenza virus with other respiratory manifestations: Secondary | ICD-10-CM

## 2018-06-30 DIAGNOSIS — E119 Type 2 diabetes mellitus without complications: Secondary | ICD-10-CM

## 2018-06-30 LAB — COMPREHENSIVE METABOLIC PANEL
ALT: 120 U/L — ABNORMAL HIGH (ref 10–40)
AST: 83 U/L — ABNORMAL HIGH (ref 15–37)
Albumin/Globulin Ratio: 0.7 — ABNORMAL LOW (ref 1.1–2.2)
Albumin: 2.9 g/dL — ABNORMAL LOW (ref 3.4–5.0)
Alkaline Phosphatase: 189 U/L — ABNORMAL HIGH (ref 40–129)
Anion Gap: 14 (ref 3–16)
BUN: 7 mg/dL (ref 7–20)
CO2: 20 mmol/L — ABNORMAL LOW (ref 21–32)
Calcium: 8.8 mg/dL (ref 8.3–10.6)
Chloride: 103 mmol/L (ref 99–110)
Creatinine: 0.8 mg/dL (ref 0.6–1.1)
GFR African American: >60 (ref >60)
GFR Non-African American: >60 (ref >60)
Globulin: 4.1 g/dL
Glucose: 243 mg/dL — ABNORMAL HIGH (ref 70–99)
Potassium: 4.5 mmol/L (ref 3.5–5.1)
Sodium: 137 mmol/L (ref 136–145)
Total Bilirubin: 0.8 mg/dL (ref 0.0–1.0)
Total Protein: 7 g/dL (ref 6.4–8.2)

## 2018-06-30 LAB — MICROALBUMIN / CREATININE URINE RATIO
Creatinine, Ur: 283.9 mg/dL — ABNORMAL HIGH (ref 28.0–259.0)
Microalbumin Creatinine Ratio: 9.2 mg/g (ref 0.0–30.0)
Microalbumin, Random Urine: 2.6 mg/dL — ABNORMAL HIGH (ref <2.0)

## 2018-06-30 MED ORDER — AZITHROMYCIN 250 MG PO TABS
250 | ORAL_TABLET | ORAL | 0 refills | Status: AC
Start: 2018-06-30 — End: 2018-07-05

## 2018-06-30 MED ORDER — AZITHROMYCIN 250 MG PO TABS
250 | ORAL_TABLET | ORAL | 0 refills | Status: DC
Start: 2018-06-30 — End: 2018-06-30

## 2018-06-30 NOTE — Telephone Encounter (Signed)
 Pharm and med pending.   Please advise.

## 2018-06-30 NOTE — Telephone Encounter (Signed)
 I was not aware of that XR!      I order it because flu can be complicated with pna.   And because her fever,     I will sent an antibiotic but if her fever persist needs to go back to ED, specially if sob.

## 2018-06-30 NOTE — Telephone Encounter (Signed)
 Patient is calling [facility] no longer takes her insurance will need her medication azithromycin  (ZITHROMAX ) 250 MG tablet to be sent to [facility] [address], [place] - [address] [name] [phone] [place] [phone]    Patient apologizes for the inconvenience  this may cause she was unaware that they no longer took her insurance.      Please advise  Raelynn [phone]

## 2018-06-30 NOTE — Telephone Encounter (Signed)
 Pt has been informed

## 2018-06-30 NOTE — Addendum Note (Signed)
Addended by: Daune Perch on: 06/30/2018 07:00 AM     Modules accepted: Orders

## 2018-06-30 NOTE — Telephone Encounter (Signed)
rx transmitted electronically to the pharmacy via epic

## 2018-06-30 NOTE — Unmapped (Signed)
See telephone encounter from  06/27/2018

## 2018-07-01 ENCOUNTER — Encounter

## 2018-07-01 ENCOUNTER — Telehealth

## 2018-07-01 LAB — HEMOGLOBIN A1C
Hemoglobin A1C: 7.3 %
eAG: 162.8 mg/dL

## 2018-07-01 NOTE — Telephone Encounter (Signed)
 [facility] called in needing another order for SST hepatitis panel order       Please advise Olam [phone]

## 2018-07-01 NOTE — Telephone Encounter (Signed)
 Please advise.

## 2018-07-01 NOTE — Telephone Encounter (Signed)
 Order in chart

## 2018-07-01 NOTE — Telephone Encounter (Signed)
 Orders has been faxed to lab.

## 2018-07-01 NOTE — Unmapped (Signed)
Done.

## 2018-07-02 NOTE — Telephone Encounter (Signed)
 [facility] called back stated that they  Do not have an SST tube in the lab. Please the pt know.

## 2018-07-06 MED ORDER — letrozole (FEMARA) 2.5 mg tablet
2.5 | ORAL_TABLET | Freq: Every day | ORAL | 11 refills | Status: AC
Start: 2018-07-06 — End: 2021-05-01

## 2018-07-06 NOTE — Unmapped (Signed)
Pt can no longer use [facility] pharmacy due to insurance  Updated pharmacy in chart  Pt needs Letrozole Rx to go to [facility]

## 2018-07-06 NOTE — Unmapped (Signed)
Rx sent to [facility]

## 2018-08-13 IMAGING — DX DG CHEST 2V
2 series · 2 of 2 positions shown · non-contrast
Comparison: 02/26/2014

CLINICAL DATA: Right flank pain today, with pleuritic component.
Finished antibiotics today for a treatment of pneumonia.

EXAM:
CHEST  2 VIEW

[chest pa]
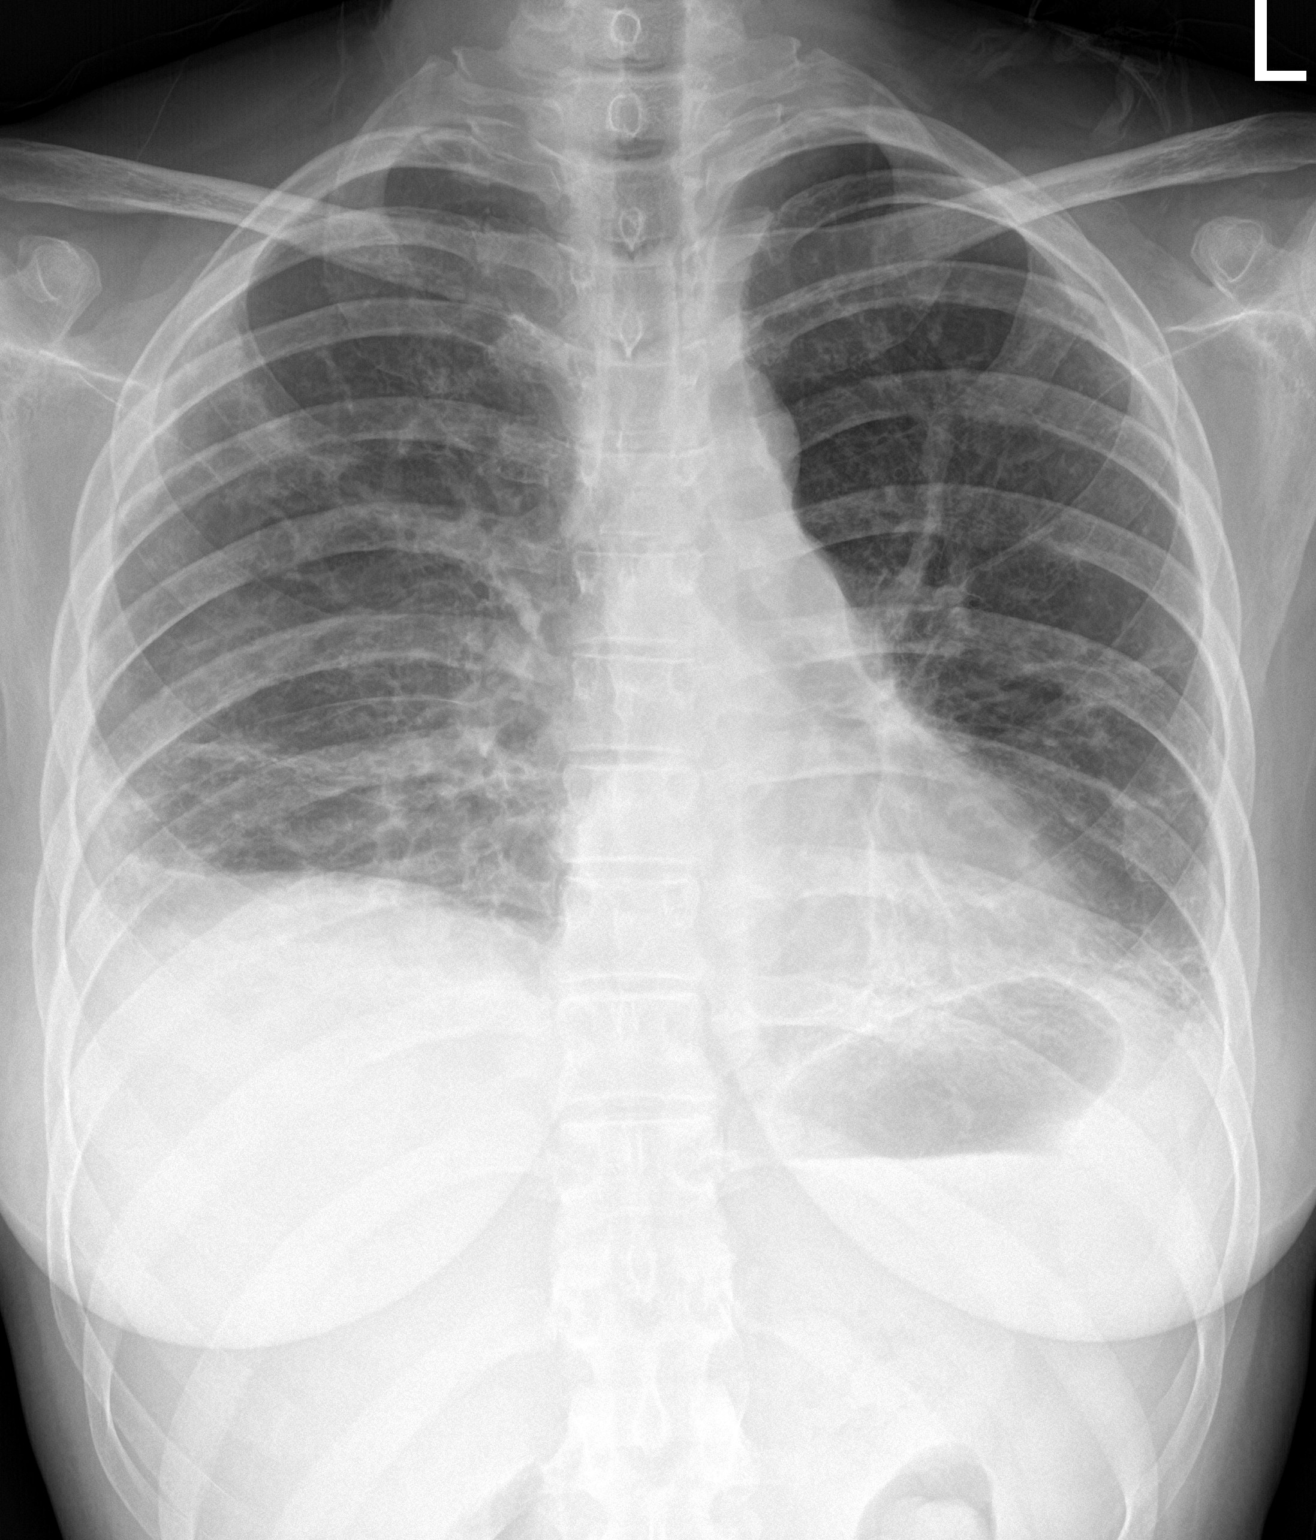

[chest lat]
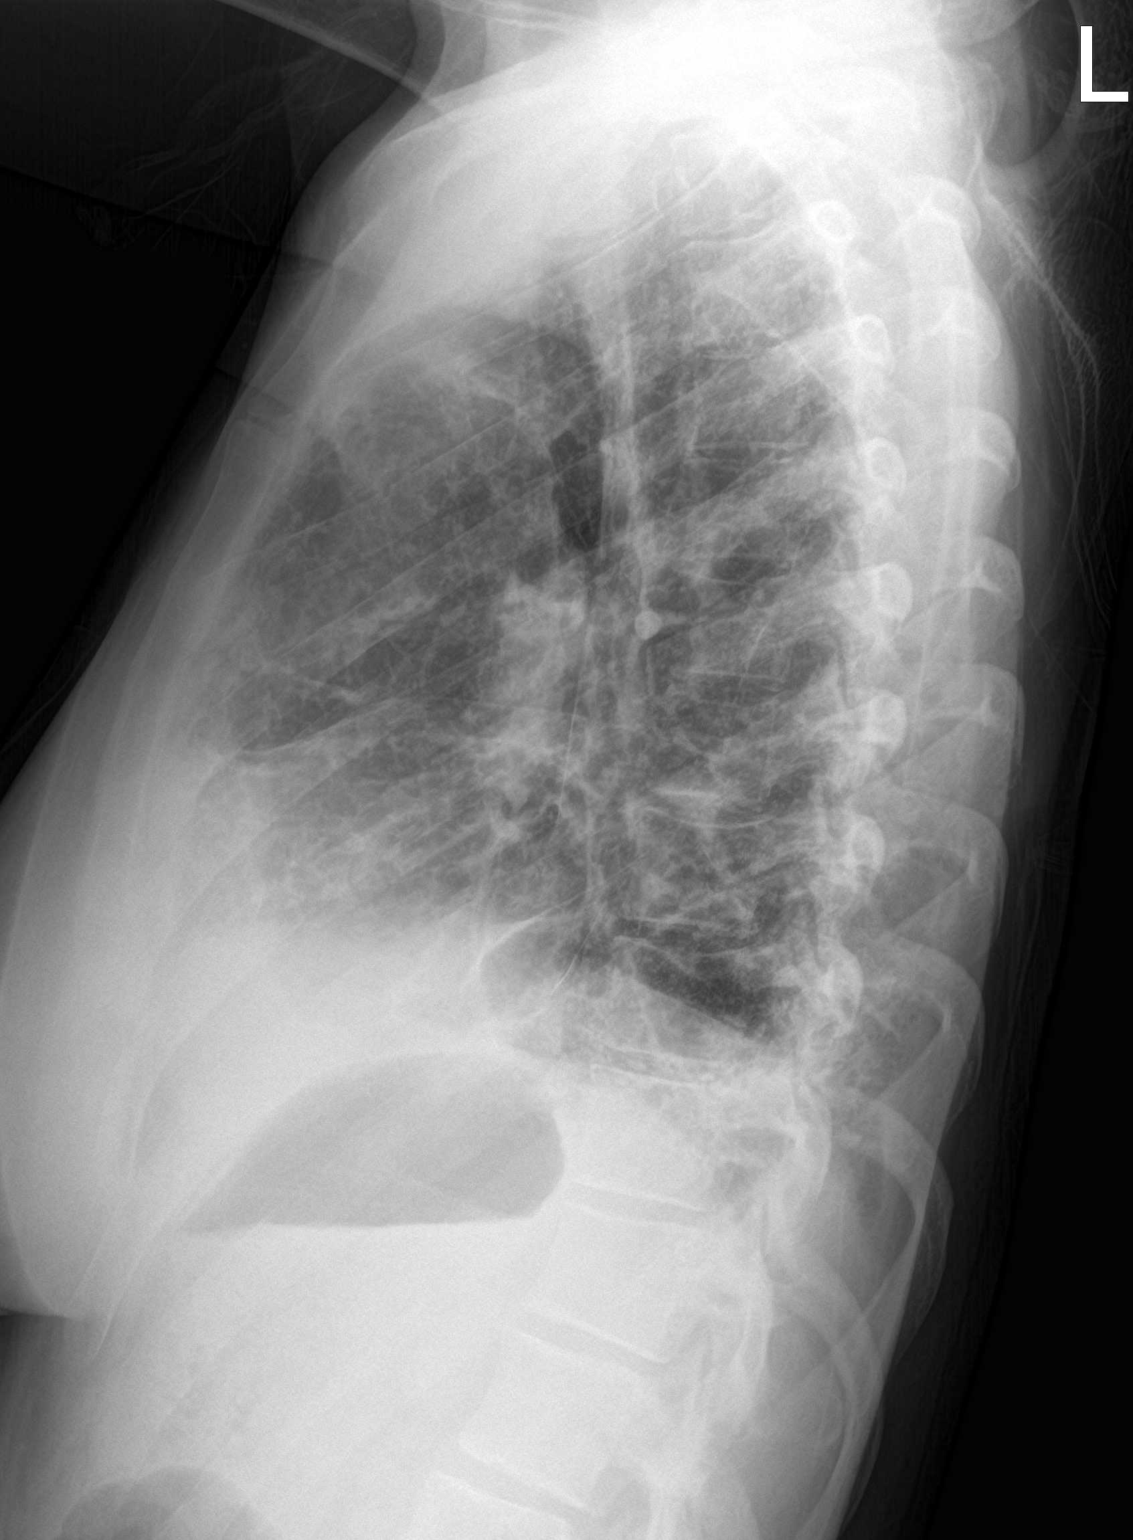

[2 of 2 positions shown; findings below may reference images not displayed]

FINDINGS: Interstitial and alveolar opacities are present in both bases, some
of which is chronic and unchanged. However, there is more confluent
opacity in the lateral right base which could represent a
superimposed pneumonia. Mild chronic appearing blunting of the right
lateral costophrenic angle. No significant posterior costophrenic
angle fluid.
IMPRESSION: Question right lateral base airspace opacity superimposed on
moderately severe chronic lung base opacities. This could represent
pneumonia or aspiration.

## 2018-08-23 ENCOUNTER — Encounter

## 2018-08-24 MED ORDER — MONTELUKAST SODIUM 10 MG PO TABS
10 | ORAL_TABLET | ORAL | 2 refills | 90.00000 days | Status: DC
Start: 2018-08-24 — End: 2019-03-08

## 2018-08-24 NOTE — Telephone Encounter (Signed)
Last seen 02/16/18

## 2018-08-31 NOTE — Unmapped (Signed)
Called pt to discuss upcoming surgery.  Informed pt Dr. Maurine Minister next surgery day is 10/20/18 or could schedule with a different provider.  Also, inquired about LMP.  No answer.  Left message.

## 2018-09-01 NOTE — Unmapped (Signed)
Pt returning call.  She states 10/20/2018 does not work for her employer.  Preference is for the week of 11/23/2018.    LMP was the 3rd or 4th week of April.  She is requesting a call back.

## 2018-09-24 NOTE — Unmapped (Signed)
Talked to patient  Reassured that she should be able to go back to work on Friday or Monday

## 2018-10-10 NOTE — Unmapped (Addendum)
Called patient after reviewing chart.   She had a history of uterine fibroids.   Reports that this is her third period this month.   Planning HSC-myomectomy on 11/17/2018.  Current regimen is: NETA 5 mg daily and LE 2.5 mg.   Patient admits she has not been been compliant with her medications (has been off the meds for 2 weeks).   She re-started them again last night. Reports she has been lazy about taking them.   Currently changing her pad every 30 mins.   Denies shortness of breath, dizziness, or palpitations.   Recommend increasing NETA to 10 mg daily starting today. Prior phone indicates that she had no bleeding on Aygestin when compliant.   Will consider TXA if Aygestin not successful.   Emphasized importance of being compliant with medications to optimize her health prior to surgery.   Please add patient to the schedule on Monday at [facility] at 3:45 pm for follow up.   If her bleeding does not improve or she develops symptoms of anemia, needs to go to the ED.   Discussed with on call REI, Dr. Zara Chess.

## 2018-10-10 NOTE — Unmapped (Signed)
Patient Name : Maria Stevenson    Patient Date of Birth: Jun 03, 1985    Relationship of Caller to Patient: self    Patient of: Dr. Jonnie Kind of Call: Patient having third period of the month and period is extremely heavy    On Call Provider Contacted: Dr. Francee Gentile     Provider contacted via pager or cell cell    Time and Method: direct connect 2:39pm

## 2018-10-11 NOTE — Unmapped (Signed)
Attempted to reach out to check on patient. No answer. Will try back later today.

## 2018-10-11 NOTE — Unmapped (Signed)
Called patient.   Increased dose of NETA to 10 mg daily.   No longer needing to change every 30 mins.   Now changing every 1.5 hours.   No lightheadedness or dizziness.   Denies SOB or palpitations.   Has appointment scheduled tomorrow.

## 2018-10-12 ENCOUNTER — Ambulatory Visit
Admit: 2018-10-12 | Discharge: 2018-10-12 | Payer: PRIVATE HEALTH INSURANCE | Attending: Reproductive Endocrinology | Primary: Oncology

## 2018-10-12 DIAGNOSIS — N939 Abnormal uterine and vaginal bleeding, unspecified: Secondary | ICD-10-CM

## 2018-10-12 MED ORDER — tranexamic acid (LYSTEDA) 650 mg Tab
650 | ORAL_TABLET | Freq: Three times a day (TID) | ORAL | 0 refills | 7.00000 days | Status: AC
Start: 2018-10-12 — End: 2018-10-17

## 2018-10-12 MED ORDER — leuprolide (LUPRON DEPOT) 3.75 mg injection
3.75 | PACK | INTRAMUSCULAR | 0 refills | Status: AC
Start: 2018-10-12 — End: 2018-11-17

## 2018-10-12 MED ORDER — elagolix 200 mg Tab
200 | ORAL_TABLET | Freq: Every day | ORAL | 6 refills | 28.00000 days | Status: AC
Start: 2018-10-12 — End: 2018-11-17

## 2018-10-12 NOTE — Unmapped (Signed)
CC: Follow up, AUB    HPI: Maria Stevenson is a 33 y.o. G33P2002 female here for follow up. Comes for follow up.     Noted to have 9 cm submucosal fibroid on MRI.  Plan for depo Lupron to shrink fibroid with HSC myomectomy in August but having issues getting lupron covered.     On NE 5 mg and LE 2.5 mg daily. Patient called over the weekend with heavy bleeding. NETA dose increased to 10 mg daily. Patient admitted she was not compliant with the medications. Bleeding has slowed but patient endorses worsening PICA (eating ice).     Pelvic MRI 02/10/18:  FINDINGS:  Uterus: Uterus measures 14.4 x 10.2 x 10.1 cm. There is a large anterior submucosal fibroid measuring 8.2 x 8.4 x 8.9 cm (series 5 image 20 and series 6 image 24) that results in distortion of the endometrial canal, which is not abnormally thickened. The fibroid enhances homogeneously. There is another intramural fibroid in the anterior uterine body measuring 3.1 x 2.6 x 1.8 cm (series 5 image 31 and series 6 image 27).   ??  Ovaries: The right ovary is normal and measures 3.4 x 2.4 x 1.9 cm. Adjacent to the right ovary, there is a 3 x 2.6 x 3.2 cm unilocular paraovarian cyst.  The left ovary measures 2.6 x 2.1 x 3.1 cm and contains a 1.8 cm cyst with dependent T2 hypointense and T1 hyperintense debris, likely an involuting hemorrhagic cyst.     Pertinent History:  AUB secondary to uterine fibroids  Anemia needing iron infusions in past   Not currently interested in fertility     PMH:  - Hyperlipidemia: She has mixed hyperlipidemia  On simvastatin 40 mg daily  - Hypothyroidism : she has hypothyroidism since 2012.   On levothyroxine 50 mcg daily.   - Type 2 DM - on metformin and insulin, on Losartan 50 mg daily for renal protection   - Asthma - Singulair and zyrtec    Review of Systems:  The following systems were reviewed and negative, except those noted in HPI: General, cardiovascular, respiratory, gastrointestinal, genitourinary, musculoskeletal, skin,  neurologic, psychiatric, endocrine, heme, allergy    Objective:  There were no vitals filed for this visit.  General: No acute distress, alert & oriented x 3  Head: normocephalic, pupils equal  Extremities: no significant edema  Skin: no rash or lesion  Pelvic: normal external genitalia    GYN scan -- see below     Results Review:     Lab Results   Component Value Date    HGB 10.4 (L) 02/03/2018    HCT 34.0 (L) 02/03/2018    CREATININE 0.89 02/03/2018    POCTAMH 4.29 02/04/2018    POCTPRLCTIN 16.92 02/04/2018    POCTSH 3.80 02/04/2018    HGBA1C 8.3 (H) 03/17/2018     Assessment:   33 y.o. [id number]   Uterine fibroids  PCOS  Menorrhagia  Type 2 DM - A1C 8.3 (03/2018)  HLD  Hypothyroidism   Not currently interested in fertility   Heavy vaginal bleeding     Plan:   Rx sent for Lupron and Pollie Friar (will see which one will be covered by insurance quicker). Reviewed importance of this to stop current bleeding and shrink fibroid prior to surgery.   Scheduled for [facility] - myomectomy in August  Discussed probable need for more than one surgery  Will order CBC today to assess blood counts - aware may need blood or iron  transfusion depending on the levels.   Emphasized need for compliance with NETA and LE daily to reduce bleeding prior to surgery - continue with NETA 10 mg daily for now  Rx sent for Lysteda (Tranexamic acid) x 5 days  Strongly recommend iron supplement   I personally saw, examined, and counseled the patient with the fellow and agree with the documented assessment and plan above.      Murrell Converse, MD    Time Spent With Patient:   I spent a total of 20 minutes with patient and >= 50% of time was spent discussing/counseling regarding problem, patient education, management options and treatment plan as documented above. \\    GYN Ultrasound    No LMP recorded.     Type of Ultrasound:  Transvaginal    Uterus: 9 x 13 x 9.6 cm.  Endometrial thickness: obscured by fibroid  Uterine Pathology: Submucosal fibroid  measuring 103 x 81 x 96 mm    Right Ovary: 36 x 23 mm.  Cysts: none, small follicles     Left Ovary:  32 x 16 mm. Cysts: none, small follicles.    Impression: Large ~ 10 cm submucosal fibroid, normal, resting ovaries.     Murrell Converse, MD

## 2018-10-13 NOTE — Unmapped (Signed)
Left message  Not compliant with LE or NETA  Restarted on Saturday  May move suregery to October   Change LE to DL  Continiue NETA

## 2018-10-14 NOTE — Unmapped (Signed)
Talked to patient  Wants to still have surgery on Aug 4  Told to do preop at end of Aug  Has been noncompliance with LE and NETA  Noted no significant change with fibroid  Understands that it may take more than one surgery to remove the fibroid  Promises to be compliant from this point

## 2018-10-18 NOTE — Unmapped (Signed)
Left message for patient  Told to go to ER if bleeding is excessive  Told to take LE 2.5 BID and NETA BID  Come to office this week  Has been recently noncomplianct with medicaitons

## 2018-10-19 NOTE — Unmapped (Signed)
Pt calling needing to schedule to see Dr. Maisie Fus she stated she has been on her cycle for two weeks and he wants to see her.  Please give her a call to get her an appt thank you.

## 2018-10-19 NOTE — Unmapped (Signed)
Pt states a PA is required for her lupron. Requesting that this be started.

## 2018-10-19 NOTE — Unmapped (Signed)
Patient still bleeding. Wanted to see Dr. Maisie Fus today. Offered her an appointment at [facility] with Dr. Whitney Post. She declined. Scheduled her with Dr. Maisie Fus on Tuesday 10/20/2018 at 10:15. Patient verbalized understanding.

## 2018-10-19 NOTE — Unmapped (Signed)
I  Submitted PA for Lupron [place] through cover my meds.

## 2018-10-20 ENCOUNTER — Ambulatory Visit: Admit: 2018-10-20 | Discharge: 2018-10-20 | Payer: PRIVATE HEALTH INSURANCE | Primary: Oncology

## 2018-10-20 ENCOUNTER — Institutional Professional Consult (permissible substitution): Admit: 2018-10-20 | Discharge: 2018-10-20 | Payer: PRIVATE HEALTH INSURANCE | Primary: Oncology

## 2018-10-20 DIAGNOSIS — Z139 Encounter for screening, unspecified: Secondary | ICD-10-CM

## 2018-10-20 LAB — COMPREHENSIVE METABOLIC PANEL
A/G Ratio: 1.1 (calc) (ref 1.0–2.5)
ALT: 10 U/L (ref 6–29)
AST: 10 U/L (ref 10–30)
Albumin: 3.8 g/dL (ref 3.6–5.1)
Alkaline Phosphatase: 130 U/L (ref 31–125)
BUN: 7 mg/dL (ref 7–25)
CO2: 24 mmol/L (ref 20–32)
Calcium: 9.3 mg/dL (ref 8.6–10.2)
Chloride: 102 mmol/L (ref 98–110)
Creatinine: 0.64 mg/dL (ref 0.50–1.10)
GFR MDRD Af Amer: 136 mL/min/{1.73_m2} (ref >=60)
Globulin, Total: 3.6 g/dL (calc) (ref 1.9–3.7)
Glucose: 334 mg/dL (ref 65–99)
Potassium: 4.3 mmol/L (ref 3.5–5.3)
Sodium: 136 mmol/L (ref 135–146)
Total Bilirubin: 0.5 mg/dL (ref 0.2–1.2)
Total Protein: 7.4 g/dL (ref 6.1–8.1)
eGFR Non-Afr. American: 117 mL/min/{1.73_m2} (ref >=60)

## 2018-10-20 LAB — POCT - FREE T4: POCT - Free T4: 1.25

## 2018-10-20 LAB — CBC AND DIFFERENTIAL
Basophils Absolute: 37 cells/uL (ref 0–200)
Basophils Relative: 0.4 %
Eosinophils Absolute: 110 cells/uL (ref 15–500)
Eosinophils Relative: 1.2 %
Hematocrit: 28.1 % (ref 35.0–45.0)
Hemoglobin: 8.3 g/dL (ref 11.7–15.5)
Lymphocytes Absolute: 2429 cells/uL (ref 850–3900)
Lymphocytes Relative: 26.4 %
MCH: 22.5 pg (ref 27.0–33.0)
MCHC: 29.5 g/dL (ref 32.0–36.0)
MCV: 76.2 fL (ref 80.0–100.0)
MPV: 10.7 fL (ref 7.5–12.5)
Monocytes Absolute: 497 cells/uL (ref 200–950)
Monocytes Relative: 5.4 %
Neutrophils Absolute: 6127 cells/uL (ref 1500–7800)
Neutrophils Relative: 66.6 %
Platelets: 419 10*3/uL (ref 140–400)
RBC: 3.69 10*6/uL (ref 3.80–5.10)
RDW: 16.9 % (ref 11.0–15.0)
WBC: 9.2 10*3/uL (ref 3.8–10.8)

## 2018-10-20 LAB — POCT THYROID STIMULATING HORMONE (TSH): TSH, POC: 3.17

## 2018-10-20 NOTE — Unmapped (Signed)
Pre-op H&P    CC:  Follow up, AUB, fibroids     HPI: 33 y.o. G58P2002 female with AUB secondary to 9cm submucosal fibroid.      Last seen 6/29, rx for lupron and orlissa sent to see which would be covered by insurance. Patient called in over weekend/early this week due to continued bleeding. Dose of NETA and LE increased. She reports bleeding is lightening up.     Pertinent History:  NETA 10mg  (increased 6/26), LE 2.5mg  BID    Pelvic MRI 02/10/18:  Uterus 14.4 x 10.2 x 10.1 cm. There is a large anterior submucosal fibroid measuring 8.2 x 8.4 x 8.9 cm (series 5 image 20 and series 6 image 24) that results in distortion of the endometrial canal, which is not abnormally thickened. The fibroid enhances homogeneously. There is another intramural fibroid in the anterior uterine body measuring 3.1 x 2.6 x 1.8 cm (series 5 image 31 and series 6 image 27).   ??Ovaries: The right ovary is normal and measures 3.4 x 2.4 x 1.9 cm. Adjacent to the right ovary, there is a 3 x 2.6 x 3.2 cm unilocular paraovarian cyst.  The left ovary measures 2.6 x 2.1 x 3.1 cm and contains a 1.8 cm cyst with dependent T2 hypointense and T1 hyperintense debris, likely an involuting hemorrhagic cyst.   ??  PMHx:  -??Hyperlipidemia: She has mixed hyperlipidemia  On simvastatin 40 mg daily  -??Hypothyroidism: she has hypothyroidism since 2012   On levothyroxine 50 mcg daily.   - Type 2 DM - on metformin and insulin, on Losartan 50 mg daily??for renal protection   - Asthma - Singulair and zyrtec    Review of Systems:  The following systems were reviewed and negative, except those noted in HPI: General, cardiovascular, respiratory, gastrointestinal, genitourinary, musculoskeletal, skin, neurologic, psychiatric, endocrine, heme, allergy    Objective:  There were no vitals filed for this visit.  General: No acute distress, alert & oriented x 3  Head: normocephalic, pupils equal  Extremities: no significant edema  Skin: no rash or lesion  Pelvic:  deferred    Results Review:     Lab Results   Component Value Date    HGB 10.4 (L) 02/03/2018    HCT 34.0 (L) 02/03/2018    CREATININE 0.89 02/03/2018    POCTAMH 4.29 02/04/2018    POCTPRLCTIN 16.92 02/04/2018    POCTSH 3.80 02/04/2018    HGBA1C 8.3 (H) 03/17/2018     No results found for: RUBELLAIGG, RUBNUM, VARICELLAIGG, HIV12ABAGN, HEPBSAG, HCVAB, RPR, LABRPR, TREPIA, CFRESULT, SMN1, SMN2, TECHRESULTS   Assessment:   33 y.o. [id number]   Submucosal fibroid  AUB - issues with compliance regarding medication  PCOS    Plan:   PA for lupron submitted yesterday  Ideally would like patient to be in Lupron for 3 months prior to surgery (will move surgery back to Oct)  If unable to get Lupron approved, will proceed with surgery on 8/4  Continue LE 2.5mg  BID and NETA 10mg  BID  Upcoming HSC myomectomy, understands may need to be more than one surgery given size of fibroid  Informed consent obtained today   NPO after midnight night prior to surgery   CBC, CMP, TSH, free T4 today     I spent a total of 20 minutes of which 15 minutes was spent in counseling and/or coordination of care with patient and/or family. Topics discussed include submucous fibroid and AUB      I saw and evaluated  the patient. Agree with resident's note.  Will continue per plan as described above.  I performed the entire procedure  Casimiro Needle A. Maisie Fus, M.D.

## 2018-10-20 NOTE — Unmapped (Signed)
labs

## 2018-10-21 MED ORDER — ferrous sulfate 325 (65 FE) MG tablet
325 | ORAL_TABLET | Freq: Two times a day (BID) | ORAL | 6 refills | Status: AC
Start: 2018-10-21 — End: 2018-11-12

## 2018-10-21 NOTE — Unmapped (Signed)
Called patient to review lab results from 7/7. CBC shows moderate anemia with Hgb 8.3, discussed Fe supplement. Patient currently taking PNV, will send Fe BID to pharmacy, instructed to take with meals. Also advised on bowel regimen to avoid constipation.     Also discussed BG of 334 on CMP. Patient states she had not eaten anything recently and taken her medications that day. She does not check her BG regularly, like I should. Discussed risks of surgery being cancelled if BG is not controlled and also risks of infection, healing complications if BG is out of control post-op. Patient voiced understanding, states she will check her BG more regularly and call her PCP if BG high.     Patient states she has not heard anything recently regarding PA for lupron.

## 2018-10-23 NOTE — Unmapped (Signed)
Attempted to call pateint  Mailbox is full  Calling to follow up with Hb 8.3 and elelvated glucose level  Also to start DL for submucous fibroid  Has been noncompliant oral meds

## 2018-10-30 NOTE — Telephone Encounter (Signed)
 Patient is needing a appointment is supposed to have surgery and now her sugars are high, and when she was on her period she had a fever the whole time. Patient thinks its medication and would like a appointment     Please advise  Luanna [phone]

## 2018-10-30 NOTE — Telephone Encounter (Signed)
 Please advise.

## 2018-10-30 NOTE — Unmapped (Signed)
[facility] states [facility] [employer] needs PA  Please fax PA to [phone]

## 2018-10-30 NOTE — Unmapped (Signed)
Left message  Trying to see if patient received DL injection  Had low Hb and high glucose  Requestd that patient call back  Unclear if she received the first shot  Will prefer her glucose controlled and Hb above 11.7

## 2018-11-02 NOTE — Telephone Encounter (Signed)
Pt has been scheduled with NP

## 2018-11-02 NOTE — Telephone Encounter (Signed)
 I dont have apt until Aug  Refer to NP

## 2018-11-03 ENCOUNTER — Encounter

## 2018-11-03 ENCOUNTER — Ambulatory Visit: Admit: 2018-11-03 | Discharge: 2018-11-03 | Payer: PRIVATE HEALTH INSURANCE | Attending: Oncology | Primary: Oncology

## 2018-11-03 DIAGNOSIS — E119 Type 2 diabetes mellitus without complications: Secondary | ICD-10-CM

## 2018-11-03 LAB — POCT GLYCOSYLATED HEMOGLOBIN (HGB A1C): Hemoglobin A1C: 9.4 %

## 2018-11-03 LAB — POCT GLUCOSE: Glucose: 271 mg/dL

## 2018-11-03 MED ORDER — KROGER PEN NEEDLES 29G X 12MM MISC
Freq: Every day | 3 refills | 27.00000 days | Status: DC
Start: 2018-11-03 — End: 2024-03-26

## 2018-11-03 MED ORDER — SIMVASTATIN 40 MG PO TABS
40 | ORAL_TABLET | ORAL | 3 refills | Status: DC
Start: 2018-11-03 — End: 2019-12-21

## 2018-11-03 MED ORDER — VITAMIN D3 50 MCG (2000 UT) PO TABS
50 | ORAL_TABLET | ORAL | 0 refills | 30.00000 days | Status: DC
Start: 2018-11-03 — End: 2019-05-18

## 2018-11-03 NOTE — Progress Notes (Signed)
 Subjective:      Patient ID: Shelly Richard is a 33 y.o. female.    Chief Complaint   Patient presents with    Diabetes     B/S check. states has been running high, AM reading before eating was 350.      HPI   Patient is here for hyperglycemia. Reports BS has been running between 200-300 for the past few weeks. She admits to poor diet. She has been compliant with medication. Takes 16 units of Xultophy  daily and metformin  1000 mg BID. She is scheduled for hysteroscopy and removal of fibroid in 2 weeks. She was told she needs better control of her sugars prior to surgery. She denies polydipsia, polyphagia, polyuria, nausea, vomiting, or lethargy.     Review of Systems   Constitutional: Negative for chills, fatigue and fever.   HENT: Negative for congestion, sinus pressure and sinus pain.    Respiratory: Negative for cough, shortness of breath and wheezing.    Cardiovascular: Negative for chest pain and palpitations.   Gastrointestinal: Negative for abdominal pain, constipation and diarrhea.   Endocrine: Negative for polydipsia, polyphagia and polyuria.   Musculoskeletal: Negative for arthralgias, back pain and myalgias.   Skin: Negative for color change, pallor and rash.   Neurological: Negative for dizziness, syncope, weakness, light-headedness and headaches.   Psychiatric/Behavioral: Negative for behavioral problems, confusion and sleep disturbance. The patient is not nervous/anxious.        Objective:   Physical Exam  Constitutional:       Appearance: She is well-developed.   HENT:      Head: Normocephalic and atraumatic.   Eyes:      Conjunctiva/sclera: Conjunctivae normal.      Pupils: Pupils are equal, round, and reactive to light.   Neck:      Musculoskeletal: Normal range of motion and neck supple.      Thyroid: No thyromegaly.      Vascular: No JVD.   Cardiovascular:      Rate and Rhythm: Normal rate and regular rhythm.      Heart sounds: Normal heart sounds.   Pulmonary:      Effort: Pulmonary effort is  normal. No respiratory distress.      Breath sounds: Normal breath sounds. No wheezing.   Musculoskeletal: Normal range of motion.         General: No deformity.   Skin:     General: Skin is warm and dry.      Capillary Refill: Capillary refill takes less than 2 seconds.   Neurological:      Mental Status: She is alert and oriented to person, place, and time.   Psychiatric:         Mood and Affect: Mood normal.         Behavior: Behavior normal.         Assessment:      See Problem List assessment and plan       Plan:       Type 2 diabetes mellitus without complication (HCC)  Stable, not controlled   A1c 9.4%   Check BMP   Increase Xultophy  to 16 units, ncrease to 20 units in 3 days if BS remains over 200.   Log BS daily   Follow up in 10 days         Patient engaged in shared decision making. Information given to evaluate options of treatment, understand what is needed and discuss importance of following plan.

## 2018-11-03 NOTE — Patient Instructions (Signed)
 Increase insulin  to 18 units in the morning. If BS still running in the 200s, increase to 20 units on Saturday.

## 2018-11-03 NOTE — Assessment & Plan Note (Signed)
 Stable, not controlled   A1c 9.4%   Check BMP   Increase Xultophy  to 16 units, ncrease to 20 units in 3 days if BS remains over 200.   Log BS daily   Follow up in 10 days

## 2018-11-04 LAB — BASIC METABOLIC PANEL
Anion Gap: 13 (ref 3–16)
BUN: 11 mg/dL (ref 7–20)
CO2: 21 mmol/L (ref 21–32)
Calcium: 9.6 mg/dL (ref 8.3–10.6)
Chloride: 107 mmol/L (ref 99–110)
Creatinine: 0.7 mg/dL (ref 0.6–1.1)
GFR African American: >60 (ref >60)
GFR Non-African American: >60 (ref >60)
Glucose: 228 mg/dL — ABNORMAL HIGH (ref 70–99)
Potassium: 4.3 mmol/L (ref 3.5–5.1)
Sodium: 141 mmol/L (ref 136–145)

## 2018-11-04 NOTE — Telephone Encounter (Signed)
 From: [name]  To: [name], [name]  Sent: 11/04/2018 11:28 AM EDT  Subject: Non-Urgent Medical Question    I have a question I am on hormones for the fibroids can that make my sugar jump up too?

## 2018-11-04 NOTE — Unmapped (Signed)
[employer] [facility] on behalf of Erenest Rasher stated that Dewayne Hatch PA was denied   They are asking if an appeal will be completed by the office  Please call [phone] option 3

## 2018-11-05 NOTE — Unmapped (Signed)
Pt states Letrozole requires prior authorization. Notify her when process is complete.

## 2018-11-05 NOTE — Unmapped (Signed)
PA for Letrozole submitted through covermymeds

## 2018-11-05 NOTE — Unmapped (Signed)
Spoke with pt, confirmed submission of PA form as well as future 8/4 procedure.

## 2018-11-06 NOTE — Unmapped (Signed)
Hx of anemia and abn glucose levels  Left message to start DL injections and postpone surgery  This is the second message left

## 2018-11-09 NOTE — Telephone Encounter (Signed)
 From: Shelly Richard  To: [name], [name]  Sent: 11/09/2018 11:21 AM EDT  Subject: Non-Urgent Medical Question    Hello I meant to ask you if I can get back on the regular metformin  I believe I'm on a extended release and it's taking way to long for my sugars to come down I would like to try to go back on that to see if that might be the issue   Thank you just let me know

## 2018-11-10 NOTE — Telephone Encounter (Signed)
 From: [name]  To: [name], [name]  Sent: 11/10/2018 2:44 PM EDT  Subject: Non-Urgent Medical Question    Do you have anything on Thursday I have my pre op appt on Friday morning which pushes my off time late on Friday

## 2018-11-10 NOTE — Telephone Encounter (Signed)
Pt calling to schedule this week per Dr. Chanda Busing message. Please advise.

## 2018-11-10 NOTE — Telephone Encounter (Signed)
Talked to patient  Will come to office on Friday  Needs Hb and Glucose with HbA1C  Surgery schedule for Tuesday

## 2018-11-10 NOTE — Telephone Encounter (Signed)
Patient having surgery on Tuesday with Dr. Maisie Fus.  Scheduled Pre Op on Friday morning. Patient verbalized understanding.

## 2018-11-12 ENCOUNTER — Ambulatory Visit: Payer: PRIVATE HEALTH INSURANCE | Primary: Oncology

## 2018-11-12 ENCOUNTER — Telehealth: Admit: 2018-11-12 | Discharge: 2018-11-12 | Payer: PRIVATE HEALTH INSURANCE | Attending: Oncology | Primary: Oncology

## 2018-11-12 ENCOUNTER — Encounter

## 2018-11-12 ENCOUNTER — Encounter: Attending: Oncology | Primary: Oncology

## 2018-11-12 DIAGNOSIS — D649 Anemia, unspecified: Secondary | ICD-10-CM

## 2018-11-12 NOTE — Assessment & Plan Note (Signed)
Stable, improving   FBG low 100s   Tolerating 20 units of Xultophy   Follow up for repeat a1c in 3 months

## 2018-11-12 NOTE — Progress Notes (Signed)
11/12/2018    TELEHEALTH EVALUATION -- Audio/Visual (During JIRCV-89 public health emergency)    HPI:    Shelly Richard (DOB:  1985/08/11) has requested an audio/video evaluation for the following concern(s):    HPI  Shelly Richard is here for follow up her diabetes. Sugars have improved in the last 2 weeks. BS have been in the low 100s for the past few days. FBG this morning was 110. She is doing well with her increased dose of Xultophy. She is currently on 20 units. She has also been focusing on her diet.     She has some concern for her anemia as well. She was told her hemoglobin earlier this month is 8. She was on her menstrual cycle at the time. She has been taking an OTC multivitamin with 18 mg of iron. She denies dizziness, syncope, or near syncope. She has follow up tomorrow with her surgeon.     Review of Systems   Constitutional: Negative for chills, fatigue and fever.   HENT: Negative for congestion, sinus pressure and sinus pain.    Respiratory: Negative for cough, shortness of breath and wheezing.    Cardiovascular: Negative for chest pain and palpitations.   Gastrointestinal: Negative for abdominal pain, constipation and diarrhea.   Musculoskeletal: Negative for arthralgias, back pain and myalgias.   Skin: Negative for color change, pallor and rash.   Neurological: Negative for dizziness, syncope, weakness, light-headedness and headaches.   Psychiatric/Behavioral: Negative for behavioral problems, confusion and sleep disturbance. The patient is not nervous/anxious.        Prior to Visit Medications    Medication Sig Taking? Authorizing Provider   simvastatin (ZOCOR) 40 MG tablet TAKE ONE TABLET BY MOUTH DAILY  Herbert Deaner, [name]   Cholecalciferol (VITAMIN D3) 50 MCG (2000 UT) TABS TAKE 1 TABLET BY MOUTH DAILY  Herbert Deaner, [name]   Insulin Pen Needle ([employer] PEN NEEDLES 29G) 29G X 12MM MISC 1 each by Does not apply route daily  Herbert Deaner, [name]   montelukast (SINGULAIR) 10 MG tablet  TAKE ONE TABLET BY MOUTH DAILY  Lise Auer, MD   XULTOPHY 100-3.6 UNIT-MG/ML SOPN INJECT 16 UNITS DAILY AS DIRECTED  Lise Auer, MD   valACYclovir (VALTREX) 500 MG tablet TAKE 1 TABLET BY MOUTH DAILY  C Joeseph Amor, MD   metFORMIN (GLUCOPHAGE) 500 MG tablet Take 2 tablets by mouth 2 times daily (with meals)  Lise Auer, MD   Lancets MISC 1 each by Does not apply route daily Please provide lancets approved by insurance.  Lise Auer, MD   Blood Glucose Monitoring Suppl [facility] BLOOD GLUCOSE METER) w/Device KIT Please provide meter approved by insurance.  Check bs qam  Lise Auer, MD   blood glucose monitor strips Tes2t qam  times a day & as needed for symptoms of irregular blood glucose. Please provide test strips approved by insurance.  Lise Auer, MD   levothyroxine (SYNTHROID) 50 MCG tablet TAKE 1 TABLET BY MOUTH DAILY  C Joeseph Amor, MD   cetirizine (ZYRTEC) 10 MG tablet TAKE 1 TABLET BY MOUTH DAILY  C Joeseph Amor, MD   Lactobacillus (ACIDOPHILUS) TABS TAKE 1 TABLET BY MOUTH EVERY DAY  Patient not taking: Reported on 02/16/2018  C Joeseph Amor, MD   losartan (COZAAR) 25 MG tablet TAKE 1 TABLET BY MOUTH EVERY DAY  C Joeseph Amor, MD   SUMAtriptan (IMITREX) 25 MG tablet Take 1 tablet  by mouth once as needed for Migraine  Patient not taking: Reported on 02/16/2018  Herbert Pun, [name]   ibuprofen (ADVIL;MOTRIN) 600 MG tablet Take 1 tablet by mouth every 6 hours as needed for Pain  C Joeseph Amor, MD   albuterol sulfate HFA 108 (90 Base) MCG/ACT inhaler Inhale 2 puffs into the lungs every 6 hours as needed for Wheezing or Shortness of Breath  C Joeseph Amor, MD       Social History     Tobacco Use   ??? Smoking status: Never Smoker   ??? Smokeless tobacco: Never Used   Substance Use Topics   ??? Alcohol use: No   ??? Drug use: No        Past Medical History:   Diagnosis Date   ??? Allergies    ??? Asthma    ??? Diabetes mellitus ([place])    ??? Genital herpes    ??? HTN (hypertension)    ??? Hyperlipidemia     ??? Hypothyroid    ??? Urticaria        PHYSICAL EXAMINATION:  Physical Exam  Constitutional:       Appearance: Normal appearance.   HENT:      Head: Normocephalic and atraumatic.      Mouth/Throat:      Mouth: Mucous membranes are moist.   Eyes:      Extraocular Movements: Extraocular movements intact.      Conjunctiva/sclera: Conjunctivae normal.   Neck:      Musculoskeletal: Normal range of motion.   Pulmonary:      Effort: Pulmonary effort is normal.   Skin:     Coloration: Skin is not jaundiced or pale.   Neurological:      General: No focal deficit present.      Mental Status: She is alert and oriented to person, place, and time.   Psychiatric:         Mood and Affect: Mood normal.         Behavior: Behavior normal.         Thought Content: Thought content normal.         ASSESSMENT/PLAN:  Type 2 diabetes mellitus without complication (HCC)  Stable, improving   FBG low 100s   Tolerating 20 units of Xultophy   Follow up for repeat a1c in 3 months     Iron deficiency anemia  Check iron studies and CBC   Likely needs higher dose supplement   Further recs pending labs       No follow-ups on file.    Shelly Richard is a 33 y.o. female being evaluated by a Virtual Visit (video visit) encounter to address concerns as mentioned above.  A caregiver was present when appropriate. Due to this being a Scientist, physiological (During LOVFI-43 public health emergency), evaluation of the following organ systems was limited: Vitals/Constitutional/EENT/Resp/CV/GI/GU/MS/Neuro/Skin/Heme-Lymph-Imm.  Pursuant to the emergency declaration under the [employer] [place] waiver authority and the [employer] Act, this Virtual Visit was conducted with patient's (and/or legal guardian's) consent, to reduce the patient's risk of exposure to COVID-19 and provide necessary medical care.  The patient (and/or legal guardian) has also been advised to contact  this office for worsening conditions or problems, and seek emergency medical treatment and/or call 911 if deemed necessary.     Patient identification was verified at the start of the visit: Yes    Total time spent on this encounter:  Not billed by time    Services were provided through a video synchronous discussion virtually to substitute for in-person clinic visit. Patient and provider were located at their individual homes.    --Herbert Deaner, [name] on 11/12/2018 at 2:38 PM    An electronic signature was used to authenticate this note.

## 2018-11-12 NOTE — Assessment & Plan Note (Signed)
 Check iron  studies and CBC   Likely needs higher dose supplement   Further recs pending labs

## 2018-11-12 NOTE — Telephone Encounter (Signed)
Appeal faxed to [phone].

## 2018-11-12 NOTE — Telephone Encounter (Signed)
Should we appeal or do you suggest a different treatment plan for patient. Please advise

## 2018-11-12 NOTE — Telephone Encounter (Signed)
Clinical program with [facility] pharmacy calling to follow up on PA denial for [place]. Wants to know if an appeal will be filed.

## 2018-11-12 NOTE — Unmapped (Signed)
Pre-Procedure Instructions  We???re pleased that you have chosen [facility] for your upcoming procedure.  The staff serving you is professionally trained to provide the highest quality care.  We encourage you to ask questions and to let the staff know your special needs.  We want your visit to be as comfortable as possible.    Your surgery is scheduled on 8/4 .  Please arrive at 1045 AM and check in at the Registration Desk on your right as you enter the lobby of the main hospital.    STARTING ONE WEEK BEFORE SURGERY  WE WOULD LIKE YOU TO STOP ASPIRIN, NSAIDS (non-steroidal anti-inflammatories such as Ibuprofen, Advil and Naproxen), SUPPLEMENTS, FISH OIL, VITAMINS, AND HERBAL SUPPLEMENTS.  ACETAMINOPHEN (TYLENOL) IS THE ONLY OVER THE COUNTER PAIN MEDICATION THAT IS OK TO TAKE BEFORE SURGERY.      INSTRUCTIONS FOR THE DAY OF SURGERY  ?? DO NOT EAT OR DRINK ANYTHING (including gum, mints, water, etc.) after midnight the night before your procedure.  You may brush your teeth and gargle on the morning of surgery, but do not swallow any water, except for a small sip, with the following medication: Levothroid      ??? Please make transportation arrangements and bring a responsible adult to accompany you home and remain with you for 24 hours.    ??? We recommend that you leave valuables (i.e. money, jewelry, credit cards) at home or with your family.  If you wear glasses or contacts, bring a case for safekeeping.    ??? Wear casual, loose fitting, and comfortable clothing.  A gown will be provided. (In an effort to keep your personal belongings safe it is recommended that you leave these belongings with your family or in the car until admitted to your room after surgery. If you do bring a bag for an overnight stay, please note that storage space is limited in the surgery area.)    ??? Bring a list of your medications and dose including herbal.  Do not bring any pills or medications to the hospital. (Exception: transplant  patients.)    ??? Bring a photo ID and your insurance card so your insurance company can be billed directly.    ??? Please be aware that no children under the age of 78 will be allowed in the preoperative area.    ??? Do not shave in the area of the surgery for 2 days prior to surgery.  If needed, a trained staff member will clip the area immediately before your surgery.    ??? Quit smoking as far in advance of surgery as possible. Patients who quit at least 30 days before surgery may have better outcomes.    ??? If you are diabetic, pay close attention to your blood sugar and try to keep it in the range your doctor wants it to be in.  If you have low blood sugar prior to coming in to the hospital you may drink a small amount of CLEAR SUGAR-CONTAINING FLUIDS WITHOUT PULP SUCH AS APPLE JUICE OR CLEAR SODA.  Depending on the procedure you are having this could impact the timing of your surgery.    ??? If you have a cold or are sick prior to surgery, contact your surgeon.    ??? Please shower at home the evening before and the morning of surgery using an antibacterial soap, such as Dial or Safeguard.    ??? Please remove all makeup, nail polish, jewelry, body piercings, powder, lotions, and  perfume/cologne before you arrive.      Special Instructions    1. Please bring Rescue Inhaler the day of surgery.          Antibacterial showering and good hand hygiene are essential to prevent surgical site infections and reduce the spread of MRSA.  Patient verbalized understanding of these instructions.    Make sure all of your health care givers are checking your ID bracelet and verifying your name and date of birth.  You will actively be involved in verifying the type of surgery you are having and the correct site.  Your health care givers should be cleaning their hands with soap and water or antibacterial foam before taking care of you and if they do not it is ok to remind them to do so.      In an effort to reduce the risks of blood clots  after your surgery you may have compression sleeves on your lower legs.  These sleeves help facilitate circulation and decrease the chances of developing any blood clots.     You may be given an incentive spirometer after surgery to use every hour to help prevent pneumonia by having you take deep breaths in and out.  You will be given instructions about proper use after surgery.         Patient/Family provided education about surgical site infection prevention.    Contact information:    [facility],  Monday - Friday 8:00 am - 4:30 pm,   [phone].    If you need to reach someone outside of regular business hours regarding your surgery please call your surgeon or   [facility] at [phone].

## 2018-11-13 ENCOUNTER — Ambulatory Visit: Admit: 2018-11-13 | Discharge: 2018-11-13 | Payer: PRIVATE HEALTH INSURANCE | Primary: Oncology

## 2018-11-13 ENCOUNTER — Encounter: Attending: Oncology | Primary: Oncology

## 2018-11-13 DIAGNOSIS — D649 Anemia, unspecified: Secondary | ICD-10-CM

## 2018-11-13 LAB — IRON AND TIBC
Iron % Saturation: 9 % — ABNORMAL LOW (ref 15–50)
Iron: 41 ug/dL (ref 37–145)
TIBC: 434 ug/dL (ref 260–445)

## 2018-11-13 LAB — CBC WITH AUTO DIFFERENTIAL
Basophils %: 0.5 %
Basophils Absolute: 0 10*3/uL (ref 0.0–0.2)
Eosinophils %: 1 %
Eosinophils Absolute: 0.1 10*3/uL (ref 0.0–0.6)
Hematocrit: 30.7 % — ABNORMAL LOW (ref 36.0–48.0)
Hemoglobin: 9.3 g/dL — ABNORMAL LOW (ref 12.0–16.0)
Lymphocytes %: 33.3 %
Lymphocytes Absolute: 3.2 10*3/uL (ref 1.0–5.1)
MCH: 21.2 pg — ABNORMAL LOW (ref 26.0–34.0)
MCHC: 30.2 g/dL — ABNORMAL LOW (ref 31.0–36.0)
MCV: 70.2 fL — ABNORMAL LOW (ref 80.0–100.0)
MPV: 9.2 fL (ref 5.0–10.5)
Monocytes %: 4.7 %
Monocytes Absolute: 0.4 10*3/uL (ref 0.0–1.3)
Neutrophils %: 60.5 %
Neutrophils Absolute: 5.7 10*3/uL (ref 1.7–7.7)
Platelets: 437 10*3/uL (ref 135–450)
RBC: 4.38 M/uL (ref 4.00–5.20)
RDW: 19.9 % — ABNORMAL HIGH (ref 12.4–15.4)
WBC: 9.5 10*3/uL (ref 4.0–11.0)

## 2018-11-13 LAB — HEMOGLOBIN A1C
Hemoglobin A1C: 8.2 %
Hemoglobin A1C: 8.2 % of total Hgb (ref <5.7)

## 2018-11-13 LAB — FERRITIN: Ferritin: 13.8 ng/mL — ABNORMAL LOW (ref 15.0–150.0)

## 2018-11-13 LAB — COMPREHENSIVE METABOLIC PANEL
A/G Ratio: 1.2 (calc) (ref 1.0–2.5)
ALT: 12 U/L (ref 6–29)
AST: 16 U/L (ref 10–30)
Albumin: 4.3 g/dL (ref 3.6–5.1)
Alkaline Phosphatase: 94 U/L (ref 31–125)
BUN: 11 mg/dL (ref 7–25)
CO2: 24 mmol/L (ref 20–32)
Calcium: 9.9 mg/dL (ref 8.6–10.2)
Chloride: 107 mmol/L (ref 98–110)
Creatinine: 0.7 mg/dL (ref 0.50–1.10)
GFR MDRD Af Amer: 132 mL/min/{1.73_m2} (ref >=60)
Globulin, Total: 3.5 g/dL (calc) (ref 1.9–3.7)
Glucose: 111 mg/dL (ref 65–99)
Potassium: 4.1 mmol/L (ref 3.5–5.3)
Sodium: 140 mmol/L (ref 135–146)
Total Bilirubin: 0.9 mg/dL (ref 0.2–1.2)
Total Protein: 7.8 g/dL (ref 6.1–8.1)
eGFR Non-Afr. American: 114 mL/min/{1.73_m2} (ref >=60)

## 2018-11-13 MED ORDER — FERROUS SULFATE 325 (65 FE) MG PO TABS
325 | ORAL_TABLET | Freq: Every day | ORAL | 5 refills | Status: DC
Start: 2018-11-13 — End: 2020-06-06

## 2018-11-13 NOTE — Unmapped (Signed)
CC: Submucosal fibroid, menorrhagia    HPI: Maria Stevenson 33 y.o. [id number] with large submucosal fibroid presents for H&P prior to [facility] myomectomy schedule on 11/17/2018.      Patient saw her PCP on 7/21 for BG control - her insulin was increased and she followed up yesterday via telemedicine. Per that visit her BG were improved with diet changes and increased insulin. She had a CBC yesterday through her PCP that showed Hgb 9.3.   She has a BG log for the last week that shows BG running in the 100s. Her PCP increased her insulin to 20u daily. She takes metformin BID.     Pertinent History:  Current management: NETA 10mg  (increased 6/26), LE 2.5mg  BID   - Attempted to get insurance approval for either Lupron or Ella but unsuccessful.   ??  Pelvic MRI 02/10/18:  Uterus 14.4 x 10.2 x 10.1 cm. There is a large anterior submucosal fibroid measuring 8.2 x 8.4 x 8.9 cm (series 5 image 20 and series 6 image 24) that results in distortion of the endometrial canal, which is not abnormally thickened. The fibroid enhances homogeneously. There is??another intramural fibroid in the anterior uterine body measuring 3.1 x 2.6 x 1.8 cm??(series 5 image 31 and series 6 image 27).   ??Ovaries: The right ovary is normal and measures 3.4 x 2.4 x 1.9 cm. Adjacent to the right ovary, there is a 3 x 2.6 x 3.2 cm unilocular paraovarian cyst. ??The left ovary measures 2.6 x 2.1 x 3.1 cm and contains a 1.8 cm cyst with dependent T2 hypointense and T1 hyperintense debris, likely an involuting hemorrhagic cyst.   ??  PMHx:  -??Hyperlipidemia: She has mixed hyperlipidemia  On simvastatin 40 mg daily  -??Hypothyroidism: she has hypothyroidism since 2012   On levothyroxine 50 mcg daily.   - Type 2 DM - on metformin and insulin, on Losartan 50 mg daily??for renal protection. Most recent A1c 8.3% (Dec 2019), random BG 10/20/18 334. Patient reports improved control .   - Asthma - Singulair and zyrtec    Past Medical History:   Diagnosis Date   ??? Asthma    ???  Dermatitis    ??? DM (diabetes mellitus), type 2 (CMS Dx)    ??? Hypothyroid    ??? Varicella        Past Surgical History:   Procedure Laterality Date   ??? CYST REMOVAL         Current Outpatient Medications on File Prior to Visit   Medication Sig Dispense Refill   ??? albuterol (ACCUNEB) 0.63 mg/3 mL nebulizer solution Inhale 1 ampule by nebulization every 6 hours as needed.     ??? cetirizine (ZYRTEC) 10 MG tablet Take 10 mg by mouth daily.  5   ??? elagolix 200 mg Tab Take 200 mg by mouth daily. 30 tablet 6   ??? hydrocortisone 1 % cream Apply topically 2 times a day.     ??? letrozole (FEMARA) 2.5 mg tablet Take 1 tablet (2.5 mg total) by mouth daily. Indications: fibroids 30 tablet 11   ??? leuprolide (LUPRON DEPOT) 3.75 mg injection Inject 3.75 mg into the muscle every 28 days. 1 kit 0   ??? leuprolide (LUPRON) 3.75 mg injection Inject 3.75 mg into the muscle every 28 days. 1 kit 5   ??? levothyroxine (SYNTHROID, LEVOTHROID) 50 MCG tablet Take 50 mcg by mouth daily.     ??? lidocaine (XYLOCAINE) 5 % ointment Apply topically as needed.     ???  losartan (COZAAR) 25 MG tablet Take 25 mg by mouth daily.     ??? metFORMIN (GLUCOPHAGE) 500 MG tablet Take 1,000 mg by mouth 2 times a day with meals.            ??? montelukast (SINGULAIR) 10 mg tablet Take 10 mg by mouth at bedtime (2100).     ??? norethindrone (AYGESTIN) 5 mg tablet Take 1 tablet (5 mg total) by mouth daily. 30 tablet 11   ??? simvastatin (ZOCOR) 40 MG tablet Take 40 mg by mouth at bedtime.     ??? valACYclovir (VALTREX) 500 MG tablet Take 500 mg by mouth 2 times a day.       No current facility-administered medications on file prior to visit.        Allergies   Allergen Reactions   ??? Semaglutide Hives   ??? Lisinopril    ??? Peanut Swelling     Throat and tongue itch, angioedema of face  Throat and tongue itch, angioedema of face     ??? Victoza [Liraglutide]      hives       Social History     Socioeconomic History   ??? Marital status: Single     Spouse name: Not on file   ??? Number of  children: Not on file   ??? Years of education: Not on file   ??? Highest education level: Not on file   Occupational History   ??? Not on file   Social Needs   ??? Financial resource strain: Not on file   ??? Food insecurity     Worry: Not on file     Inability: Not on file   ??? Transportation needs     Medical: Not on file     Non-medical: Not on file   Tobacco Use   ??? Smoking status: Never Smoker   ??? Smokeless tobacco: Never Used   Substance and Sexual Activity   ??? Alcohol use: No   ??? Drug use: Never   ??? Sexual activity: Yes     Partners: Male   Lifestyle   ??? Physical activity     Days per week: Not on file     Minutes per session: Not on file   ??? Stress: Not on file   Relationships   ??? Social Wellsite geologist on phone: Not on file     Gets together: Not on file     Attends religious service: Not on file     Active member of club or organization: Not on file     Attends meetings of clubs or organizations: Not on file     Relationship status: Not on file   ??? Intimate partner violence     Fear of current or ex partner: Not on file     Emotionally abused: Not on file     Physically abused: Not on file     Forced sexual activity: Not on file   Other Topics Concern   ??? Caffeine Use Yes   ??? Occupational Exposure Not Asked   ??? Exercise No   ??? Seat Belt Yes   Social History Narrative   ??? Not on file       OB History   Gravida Para Term Preterm AB Living   2 2 2     2    SAB TAB Ectopic Multiple Live Births           2      #  Outcome Date GA Lbr Len/2nd Weight Sex Delivery Anes PTL Lv   2 Term 06/30/07 [redacted]w[redacted]d  3162 g (6 lb 15.5 oz) F Vag-Spont   LIV   1 Term 2005     Vag-Spont   LIV       Family History   Problem Relation Age of Onset   ??? Melanoma Neg Hx        Vitals:    11/13/18 0844   BP: 124/78       Physical Exam:   Gen: NAD, A&Ox4  HEENT: PERRLA, EOMI, MMM  Caridac: RRR no m/r/g  Resp: CTAB no w/r/r  Abd: NT, ND, No R/G  Skin: No rashes  GU: deferred  Neuro: Ambulatory, CN 2-12 intact.     Lab Review:   No results found  for: ABOGROUP, RH, ABS, ESTRADIOL, FSH, HCGTOTQN, PROGESTERONE, PROLACTIN, TSH       Assessment:   32 y.o. [id number]  Submucosal fibroid  Menorrhagia, AUB  Anemia  Poorly controlled T2DM  Hypothyroidism  Asthma    Plan:   - CMP and A1c today. Discussed holding metformin day of surgery and taking 1/2 insulin the night prior to surgery.   - CBC checked yesterday via PCP, remains anemic but improved, Hgb 9.3. Patient to continue Fe supplement.   - COVID testing scheduled for 8/1    - R/b/a explained including internal organ damage (bowel, blood vessel, bladder); need for blood transfusion (risk of hiv/hepatitis reviewed); infection, need for other surgery to repair any injuries, longer hospital stay.   NPO reviewed  IFC obtained        Time Spent With Patient:   I spent a total of 20 minutes of which 15 minutes was spent in counseling and/or coordination of care with patient and/or family. Topics discussed include menorrhagia and submucous fibroid    I saw and evaluated the patient. Agree with resident's note.  Will continue per plan as described above.  I performed the entire procedure  Casimiro Needle A. Maisie Fus, M.D.

## 2018-11-14 ENCOUNTER — Other Ambulatory Visit: Admit: 2018-11-14 | Payer: PRIVATE HEALTH INSURANCE | Primary: Oncology

## 2018-11-14 ENCOUNTER — Institutional Professional Consult (permissible substitution): Admit: 2018-11-14 | Payer: PRIVATE HEALTH INSURANCE | Primary: Oncology

## 2018-11-14 DIAGNOSIS — Z01818 Encounter for other preprocedural examination: Secondary | ICD-10-CM

## 2018-11-14 DIAGNOSIS — D259 Leiomyoma of uterus, unspecified: Secondary | ICD-10-CM

## 2018-11-14 LAB — 2019 NOVEL CORONAVIRUS (COVID-19), NAA-B: SARS-CoV-2: NOT DETECTED

## 2018-11-14 NOTE — Telephone Encounter (Signed)
Called patient to inform her of labs results from pre-op visit yesterday. BG on CMP 111, Hgb A1c 8.2%. VM left with lab results. Plan to see patient for surgery 11/17/18.

## 2018-11-14 NOTE — Unmapped (Signed)
Covid-19 nasopharyngeal specimen collected.

## 2018-11-14 NOTE — Telephone Encounter (Signed)
Pateint wrote with menses  Will have to cancel surgery  Will request Lupron

## 2018-11-16 NOTE — Telephone Encounter (Signed)
Talked to patient  Told NPO after midnight  Signed papers for surgery

## 2018-11-16 NOTE — Telephone Encounter (Signed)
Pt desires to know if her surgery has been canceled for tomorrow   Transferred D McCane VM

## 2018-11-16 NOTE — Telephone Encounter (Signed)
Pt calling about her surgery for tomorrow, she is wanting to know if it has been canceled. Please advise.

## 2018-11-16 NOTE — Telephone Encounter (Signed)
Surgery has been added back to the OR schedule at [facility] for 11-17-18.  She is asked to arrive at [facility] for check-in and surgery start time is 2:00 PM.  Left detailed message on VM of [phone].  Advised patient that surgery is on the books for tomorrow.  Left message advising patient to arrive at [facility] for a 2 PM surgery start time at [facility].  Reminded patient of NPO status.    Just received call from patient.  Confirmed that she is back on the OR schedule for tomorrow.  Advised patient of NOON check-in and NPO status.  Advised patient to bring a photo ID and insurance card with her.  Patient agreed with all information given.

## 2018-11-16 NOTE — Unmapped (Signed)
Pre-Procedure Instructions  We???re pleased that you have chosen [facility] for your upcoming procedure.  The staff serving you is professionally trained to provide the highest quality care.  We encourage you to ask questions and to let the staff know your special needs.  We want your visit to be as comfortable as possible.    Your surgery is scheduled on 8.4.20.    Please arrive at 1200 PM and check in at the Registration Desk on your right as you enter the lobby of the main hospital.    STARTING ONE WEEK BEFORE SURGERY  WE WOULD LIKE YOU TO STOP ASPIRIN, NSAIDS (non-steroidal anti-inflammatories such as Ibuprofen, Advil and Naproxen), SUPPLEMENTS, FISH OIL, VITAMINS, AND HERBAL SUPPLEMENTS.  ACETAMINOPHEN (TYLENOL) IS THE ONLY OVER THE COUNTER PAIN MEDICATION THAT IS OK TO TAKE BEFORE SURGERY.    PER YOUR DOCTORS INSTRUCTIONS ONLY TAKE 1/2 YOUR LONG ACTING INSULIN THE NIGHT PRIOR TO SURGERY.    INSTRUCTIONS FOR THE DAY OF SURGERY    ?? DO NOT EAT OR DRINK ANYTHING (including gum, mints, water, etc.) after midnight the night before your procedure.  You may brush your teeth and gargle on the morning of surgery, but do not swallow any water, except for a small sip, with the following medication: LEVOTHYROXINE      ??? Please make transportation arrangements and bring one responsible adult to accompany you home and remain with you for 24 hours.  At this time we are allowing ONLY ONE VISITOR per patient in our facilities.  The visitor must remain in the building.  If the visitor leaves they may not re-enter the building that same day.    ??? We recommend that you leave valuables (i.e. money, jewelry, credit cards) at home or with your family.  If you wear glasses or contacts, bring a case for safekeeping.    ??? Wear casual, loose fitting, and comfortable clothing.  A gown will be provided. (In an effort to keep your personal belongings safe it is recommended that you leave these belongings with your family until  admitted to your room after surgery. If you do bring a bag for an overnight stay, please note that storage space is limited in the surgery area.)    ??? Bring a list of your medications and dose including herbal.  Do not bring any pills or medications to the hospital. (Exception: transplant patients.)    ??? Bring a photo ID and your insurance card so your insurance company can be billed directly.    ??? Do not shave in the area of the surgery for 2 days prior to surgery.  If needed, a trained staff member will clip the area immediately before your surgery.    ??? Quit smoking as far in advance of surgery as possible. Patients who quit at least 30 days before surgery may have better outcomes.    ??? If you are diabetic, pay close attention to your blood sugar and try to keep it in the range your doctor wants it to be in.  PLEASE CALL [phone] IF BLOOD SUGAR IS LOW.    ??? If you have a cold or are sick prior to surgery, contact your surgeon.    ??? Please shower at home the evening before and the morning of surgery using an antibacterial soap, such as Dial or Safeguard.    ??? Please remove all makeup, nail polish, jewelry, wristwatch, body piercings, powder, lotions, and perfume/cologne before you arrive.  Special Instructions    1. Please bring Rescue Inhaler the day of surgery.            Antibacterial showering and good hand hygiene are essential to prevent surgical site infections and reduce the spread of MRSA.  Patient verbalized understanding of these instructions.    Make sure all of your health care givers are checking your ID bracelet and verifying your name and date of birth.  You will actively be involved in verifying the type of surgery you are having and the correct site.  Your health care givers should be cleaning their hands with soap and water or antibacterial foam before taking care of you and if they do not it is ok to remind them to do so.      In an effort to reduce the risks of blood clots after your  surgery you may have compression sleeves on your lower legs.  These sleeves help facilitate circulation and decrease the chances of developing any blood clots.     You may be given an incentive spirometer after surgery to use every hour to help prevent pneumonia by having you take deep breaths in and out.  You will be given instructions about proper use after surgery.         Patient/Family provided education about surgical site infection prevention.    Contact information:    [facility],  Monday - Friday 8:00 am - 4:30 pm,   [phone].    If you need to reach someone outside of regular business hours regarding your surgery please call your surgeon or   [facility] at [phone].

## 2018-11-16 NOTE — Telephone Encounter (Signed)
Pharmacy is requesting confirmation in regards to an appeal on the alyssa (sp?). Please advise.

## 2018-11-17 LAB — POC GLU MONITORING DEVICE
POC Glucose Monitoring Device: 92 mg/dL (ref 70–100)
POC Glucose Monitoring Device: 113 mg/dL — ABNORMAL HIGH (ref 70–100)

## 2018-11-17 LAB — POC HCG QUALITATIVE, URINE: hCG Qualitative -Clinitek: NEGATIVE

## 2018-11-17 MED ORDER — propofol 10 mg/ml (DIPRIVAN) injection
10 | INTRAVENOUS | Status: AC | PRN
Start: 2018-11-17 — End: 2018-11-17
  Administered 2018-11-17: 18:00:00 200 via INTRAVENOUS

## 2018-11-17 MED ORDER — vasopressin (VASOSTRICT) IV solution Soln
20 | INTRAVENOUS | Status: AC | PRN
Start: 2018-11-17 — End: 2018-11-17
  Administered 2018-11-17: 19:00:00 20

## 2018-11-17 MED ORDER — famotidine (PF) (PEPCID) injection
20 | INTRAVENOUS | Status: AC | PRN
Start: 2018-11-17 — End: 2018-11-17
  Administered 2018-11-17: 18:00:00 20 via INTRAVENOUS

## 2018-11-17 MED ORDER — lidocaine (PF) 2% (20 mg/mL) 20 mg/mL (2 %) Soln
20 | INTRAMUSCULAR | Status: AC
Start: 2018-11-17 — End: ?

## 2018-11-17 MED ORDER — fentaNYL (SUBLIMAZE) 50 mcg/mL injection
50 | INTRAMUSCULAR | Status: AC
Start: 2018-11-17 — End: ?

## 2018-11-17 MED ORDER — lactated Ringers infusion
INTRAVENOUS | Status: AC
Start: 2018-11-17 — End: ?

## 2018-11-17 MED ORDER — fentaNYL (SUBLIMAZE) injection 25 mcg
50 | INTRAMUSCULAR | Status: AC | PRN
Start: 2018-11-17 — End: 2018-11-17

## 2018-11-17 MED ORDER — ondansetron (ZOFRAN) injection 4 mg
4 | Freq: Three times a day (TID) | INTRAMUSCULAR | Status: AC | PRN
Start: 2018-11-17 — End: 2018-11-17

## 2018-11-17 MED ORDER — dexamethasone (DECADRON) injection
4 | INTRAMUSCULAR | Status: AC | PRN
Start: 2018-11-17 — End: 2018-11-17
  Administered 2018-11-17: 19:00:00 4 via INTRAVENOUS

## 2018-11-17 MED ORDER — fentaNYL (SUBLIMAZE) injection
50 | INTRAMUSCULAR | Status: AC | PRN
Start: 2018-11-17 — End: 2018-11-17
  Administered 2018-11-17: 18:00:00 50 via INTRAVENOUS
  Administered 2018-11-17 (×2): 25 via INTRAVENOUS

## 2018-11-17 MED ORDER — dextrose 50 % in water (D50W) iv Syrg 50 mL
INTRAVENOUS | Status: AC | PRN
Start: 2018-11-17 — End: 2018-11-17

## 2018-11-17 MED ORDER — dextrose 10 % (D10W) in water infusion
10 | INTRAVENOUS | Status: AC | PRN
Start: 2018-11-17 — End: 2018-11-17

## 2018-11-17 MED ORDER — ondansetron (ZOFRAN) injection
4 | INTRAMUSCULAR | Status: AC | PRN
Start: 2018-11-17 — End: 2018-11-17
  Administered 2018-11-17: 18:00:00 4 via INTRAVENOUS

## 2018-11-17 MED ORDER — ondansetron (ZOFRAN) 4 mg/2 mL injection
4 | INTRAMUSCULAR | Status: AC
Start: 2018-11-17 — End: ?

## 2018-11-17 MED ORDER — diphenhydrAMINE (BENADRYL) injection
50 | INTRAMUSCULAR | Status: AC | PRN
Start: 2018-11-17 — End: 2018-11-17
  Administered 2018-11-17: 19:00:00 12.5 via INTRAVENOUS

## 2018-11-17 MED ORDER — sodium chloride 0.9%
INTRAMUSCULAR | Status: AC | PRN
Start: 2018-11-17 — End: 2018-11-17
  Administered 2018-11-17: 19:00:00 100

## 2018-11-17 MED ORDER — HYDROmorphone (DILAUDID) injection Syrg 0.5 mg
0.5 | INTRAMUSCULAR | Status: AC | PRN
Start: 2018-11-17 — End: 2018-11-17

## 2018-11-17 MED ORDER — ibuprofen (MOTRIN) 600 MG tablet
600 | ORAL_TABLET | Freq: Four times a day (QID) | ORAL | 0 refills | Status: AC | PRN
Start: 2018-11-17 — End: ?

## 2018-11-17 MED ORDER — fentaNYL (SUBLIMAZE) injection 50 mcg
50 | INTRAMUSCULAR | Status: AC | PRN
Start: 2018-11-17 — End: 2018-11-17

## 2018-11-17 MED ORDER — acetaminophen (TYLENOL) tablet 975 mg
325 | Freq: Once | ORAL | Status: AC | PRN
Start: 2018-11-17 — End: 2018-11-17
  Administered 2018-11-17: 18:00:00 975 mg via ORAL

## 2018-11-17 MED ORDER — furosemide (LASIX) injection
10 | INTRAMUSCULAR | Status: AC | PRN
Start: 2018-11-17 — End: 2018-11-17
  Administered 2018-11-17: 19:00:00 10 via INTRAVENOUS

## 2018-11-17 MED ORDER — ibuprofen (MOTRIN) tablet 600 mg
600 | Freq: Four times a day (QID) | ORAL | Status: AC | PRN
Start: 2018-11-17 — End: 2018-11-17
  Administered 2018-11-17: 22:00:00 600 mg via ORAL

## 2018-11-17 MED ORDER — HYDROmorphone (DILAUDID) injection Syrg 1 mg
1 | INTRAMUSCULAR | Status: AC | PRN
Start: 2018-11-17 — End: 2018-11-17

## 2018-11-17 MED ORDER — naloxone (NARCAN) injection 0.04 mg
0.4 | INTRAMUSCULAR | Status: AC | PRN
Start: 2018-11-17 — End: 2018-11-17

## 2018-11-17 MED ORDER — ketorolac (TORADOL) injection 30 mg
30 | INTRAMUSCULAR | Status: AC | PRN
Start: 2018-11-17 — End: 2018-11-17
  Administered 2018-11-17: 19:00:00 30 via INTRAVENOUS

## 2018-11-17 MED ORDER — dextrose 50 % in water (D50W) iv Syrg 25 mL
INTRAVENOUS | Status: AC | PRN
Start: 2018-11-17 — End: 2018-11-17

## 2018-11-17 MED ORDER — dexamethasone (DECADRON) injection 4 mg
4 | Freq: Once | INTRAMUSCULAR | Status: AC | PRN
Start: 2018-11-17 — End: 2018-11-17

## 2018-11-17 MED ORDER — famotidine (PF) (PEPCID) 20 mg/2 mL injection
20 | INTRAVENOUS | Status: AC
Start: 2018-11-17 — End: ?

## 2018-11-17 MED ORDER — oxyCODONE (ROXICODONE) immediate release tablet 10 mg
5 | ORAL | Status: AC | PRN
Start: 2018-11-17 — End: 2018-11-17
  Administered 2018-11-17: 21:00:00 10 mg via ORAL

## 2018-11-17 MED ORDER — lactatedRingersinfusion
INTRAVENOUS | Status: AC
Start: 2018-11-17 — End: 2018-11-17
  Administered 2018-11-17: 18:00:00 20 mL/h via INTRAVENOUS

## 2018-11-17 MED ORDER — sodium chloride 0.9 % infusion
INTRAVENOUS | Status: AC
Start: 2018-11-17 — End: 2018-11-17

## 2018-11-17 MED ORDER — furosemide (LASIX) 10 mg/mL injection
10 | INTRAMUSCULAR | Status: AC
Start: 2018-11-17 — End: ?

## 2018-11-17 MED ORDER — ketorolac (TORADOL) 30 mg/mL (1 mL) injection
30 | INTRAMUSCULAR | Status: AC
Start: 2018-11-17 — End: ?

## 2018-11-17 MED ORDER — oxyCODONE (ROXICODONE) immediate release tablet 5 mg
5 | ORAL | Status: AC | PRN
Start: 2018-11-17 — End: 2018-11-17

## 2018-11-17 MED ORDER — proMETHazine (PHENERGAN) injection 6.25 mg
25 | Freq: Four times a day (QID) | INTRAMUSCULAR | Status: AC | PRN
Start: 2018-11-17 — End: 2018-11-17

## 2018-11-17 MED ORDER — vasopressin (VASOSTRICT) 20 unit/mL IV solution Soln
20 | INTRAVENOUS | Status: AC
Start: 2018-11-17 — End: 2018-11-17

## 2018-11-17 MED ORDER — lidocaine (PF) 2% (20 mg/mL) Soln
20 | INTRAMUSCULAR | Status: AC | PRN
Start: 2018-11-17 — End: 2018-11-17
  Administered 2018-11-17: 18:00:00 100 via INTRAVENOUS

## 2018-11-17 MED ORDER — propofol 10 mg/ml (DIPRIVAN) 10 mg/mL injection
10 | INTRAVENOUS | Status: AC
Start: 2018-11-17 — End: ?

## 2018-11-17 MED ORDER — glucose chewable tablet 12 g
4 | ORAL | Status: AC | PRN
Start: 2018-11-17 — End: 2018-11-17

## 2018-11-17 MED ORDER — acetaminophen (TYLENOL) tablet 975 mg
325 | ORAL | Status: AC | PRN
Start: 2018-11-17 — End: 2018-11-17

## 2018-11-17 MED FILL — LIDOCAINE (PF) 20 MG/ML (2 %) INJECTION SOLUTION: 20 20 mg/mL (2 %) | INTRAMUSCULAR | Qty: 5

## 2018-11-17 MED FILL — FUROSEMIDE 10 MG/ML INJECTION SOLUTION: 10 10 mg/mL | INTRAMUSCULAR | Qty: 10

## 2018-11-17 MED FILL — LACTATED RINGERS INTRAVENOUS SOLUTION: INTRAVENOUS | Qty: 1000

## 2018-11-17 MED FILL — PROPOFOL 10 MG/ML INTRAVENOUS EMULSION: 10 10 mg/mL | INTRAVENOUS | Qty: 20

## 2018-11-17 MED FILL — FENTANYL (PF) 50 MCG/ML INJECTION SOLUTION: 50 50 mcg/mL | INTRAMUSCULAR | Qty: 2

## 2018-11-17 MED FILL — TYLENOL 325 MG TABLET: 325 325 mg | ORAL | Qty: 3

## 2018-11-17 MED FILL — OXYCODONE 5 MG TABLET: 5 5 MG | ORAL | Qty: 2

## 2018-11-17 MED FILL — ONDANSETRON HCL (PF) 4 MG/2 ML INJECTION SOLUTION: 4 4 mg/2 mL | INTRAMUSCULAR | Qty: 2

## 2018-11-17 MED FILL — VASOSTRICT 20 UNIT/ML INTRAVENOUS SOLUTION: 20 20 unit/mL | INTRAVENOUS | Qty: 1

## 2018-11-17 MED FILL — FAMOTIDINE (PF) 20 MG/2 ML INTRAVENOUS SOLUTION: 20 20 mg/2 mL | INTRAVENOUS | Qty: 2

## 2018-11-17 MED FILL — KETOROLAC 30 MG/ML (1 ML) INJECTION SOLUTION: 30 30 mg/mL (1 mL) | INTRAMUSCULAR | Qty: 1

## 2018-11-17 MED FILL — SODIUM CHLORIDE 0.9 % INTRAVENOUS SOLUTION: INTRAVENOUS | Qty: 100

## 2018-11-17 MED FILL — IBUPROFEN 600 MG TABLET: 600 600 MG | ORAL | Qty: 1

## 2018-11-17 MED FILL — LACTATED RINGERS INTRAVENOUS SOLUTION: 20.00 20.00 mL/hr | INTRAVENOUS | Qty: 1000

## 2018-11-17 NOTE — Op Note (Signed)
[facility]                        [facility]      PATIENT NAME:      Maria Stevenson, Maria Stevenson             MRN:               [id number]  DATE OF BIRTH:     1985/06/03                    CSN:               [id number]  PHYSICIAN:         Casimiro Needle A. Maisie Fus, MD         ADMIT DATE:        11/17/2018  DICTATED BY:       Tyrone Apple. Maisie Fus, MD         SURGERY DATE:      11/17/2018                              OPERATIVE REPORT    SURGEON:  Tyrone Apple. Maisie Fus, MD      PREOPERATIVE DIAGNOSES:  Submucous fibroid and menorrhagia.    POSTOPERATIVE DIAGNOSES:  Submucous fibroid and menorrhagia.    PROCEDURE:  Hysteroscopic resection of submucous fibroid.    ESTIMATED BLOOD LOSS:  Less than 50 mL.    ANESTHESIA:  General.    ASSISTANT:  Dr. Norva Pavlov and Dr. Arvid Right.    FINDINGS:  9 to 10 cm uterine fibroid in the cavity of the uterus with an intramural component, approximately 60% of the fibroid was removed to the base of the endometrial cavity.  No bleeding noted.    PROCEDURE IN DETAIL:  The patient was brought to the operating room.  She was placed in the dorsal lithotomy position.  Foley catheter was placed, and the patient was prepped and draped in usual sterile fashion.  A single tooth tenaculum was placed in   the anterior lip of the cervix.  The cervix was progressively dilated.  Then a MyoSure hysteroscope was placed.  Hysteroscope revealed a large submucous fibroid in the anterior portion of the uterus that extended to the posterior portion of the uterus.    The fibroid was approximately 9 to 10 cm in diameter.  Because of the size, ostia could not be seen well.  The MyoSure XL morcellator was used to resect the uterine fibroid. The fibroid was resected to the base of the cavity of the uterus and then   intrauterine pressure was decreased to allow more fibroid to come in to the cavity of the uterus.  Once this was done, the surgeons then continued to resect the fibroids until  the fluid depth of 2470 was achieved.  Once this was done, the procedure was   abandoned.  Approximately, 60% to 70% of the fibroid was resected.  There was some submucous component that was remaining.  No bleeding was noted because there was an intracervical injection of Pitressin and also Pitressin injected directly into the   fibroid.  Pitressin was used at a dose of 20 units and 100 mL of normal saline.  With no other problems noted after the end of the procedure and some remaining intramural portion of the fibroid was noted and reached depth of 2470 mL, intravenous Lasix 10  mg was given and Foley catheter remained in the bladder until the patient had gone to the recovery room.  Total   fluid deficit for the Foley catheter at the end of the procedure was approximately 30 mL.  Once all instruments were removed, the patient was then awoken and taken to the recovery room.        Jamelle Rushing, MD      MAT/AQ  DD: 11/17/2018 15:19:10  DT: 11/17/2018 20:52:54    JOB#:  [id number]

## 2018-11-17 NOTE — Anesthesia Post-Procedure Evaluation (Signed)
Anesthesia Post Note    Patient: Maria Stevenson    Procedure(s) Performed: Procedure(s):  Hysteroscopic resection of fibroid    Anesthesia type: general    Patient location: PACU    Post pain: Adequate analgesia    Post assessment: no apparent anesthetic complications    Last Vitals:   Vitals:    11/17/18 1630 11/17/18 1645 11/17/18 1700 11/17/18 1715   BP: 112/63 111/68 110/63 114/65   BP Location: Right arm Right arm Right arm Right arm   Patient Position: Lying Lying Lying Lying   Pulse: 84 83 91 88   Resp: 16 16 11 29    Temp:    97.6 F (36.4 C)   TempSrc:    Temporal   SpO2: 100% 95% 97% 96%   Weight:       Height:            Post vital signs: stable    Level of consciousness: awake    Complications: None

## 2018-11-17 NOTE — Progress Notes (Signed)
Up to void pre op. Dr. Maisie Fus made aware of meds that pt was unable to obtain due to cost.. MELANIE SHAWN ROACH

## 2018-11-17 NOTE — Unmapped (Signed)
Hysteroscopic resection of fibroid  Procedure Note    CAMRIE STOCK  11/17/2018      Pre-op Diagnosis: Uterine leiomyoma, unspecified location [D25.9]       Post-op Diagnosis: Submucosal uterine fibroid     Procedure(s):  Hysteroscopic resection of fibroid    Normal saline fluid deficit: 1470 mL    UOP: 30 mL clear urine    IVF: 1L crystalloid    Surgeon(s):  Ali Lowe, MD    Anesthesia: General LMA    Staff:   Circulator: Wilfrid Lund, RN  Scrub Person: Burnard Hawthorne, RN  Assistant: Donalda Ewings  Resident: Linus Mako, MD    Estimated Blood Loss: Minimal                 Specimens:   Specimens     ID Description Commments Type Source Tests Collected By Collected At    A UTERINE FIBROID UTERINE FIBROID Tissue Uterus ?? SURGICAL PATHOLOGY Starlyn Skeans, MD 11/17/18 1449                 Drains: Foley - to be removed prior to discharge    There were no complications unless listed below.      Arvid Right     Date: 11/17/2018  Time: 3:09 PM

## 2018-11-17 NOTE — Unmapped (Signed)
PATIENT INSTRUCTIONS  POST-ANESTHESIA    IMMEDIATELY FOLLOWING SURGERY:  Do not drive or operate machinery for the first twenty four hours after surgery.  Do not make any important decisions for twenty four hours after surgery or while taking narcotic pain medications or sedatives. Do not sign any legal documents. Do not drink any alcoholic beverages for 24 hours after surgery or while you are taking pain medicine.  If you develop severe nausea and vomiting or a severe headache please notify your doctor immediately.    DIET: Start off with clear liquids and proceed to a light diet (ie soup,crackers, light sandwich.) Advance to a regular diet. If you become nauseated go back to clear liquids until you feel better. Please stay away from greasy and spicy foods the day of surgery.      FOLLOW-UP:  Please make an appointment with your surgeon as instructed. You do not need to follow up with anesthesia unless specifically instructed to do so.      QUESTIONS?:  Please feel free to call your physician or the hospital operator if you have any questions, and they will be happy to assist you.             Hysteroscopy, Care After  Refer to this sheet in the next few weeks. These instructions provide you with information on caring for yourself after your procedure. Your health care provider may also give you more specific instructions. Your treatment has been planned according to current medical practices, but problems sometimes occur. Call your health care provider if you have any problems or questions after your procedure.  What can I expect after the procedure?  After your procedure, it is typical to have the following:  ?? You may have some cramping. This normally lasts for a couple days.  ?? You may have bleeding. This can vary from light spotting for a few days to menstrual-like bleeding for 3-7 days.  Follow these instructions at home:  ?? Rest for the first 1-2 days after the procedure.  ?? Only take over-the-counter or  prescription medicines as directed by your health care provider. Do not take aspirin. It can increase the chances of bleeding.  ?? Take showers instead of baths for 2 weeks or as directed by your health care provider.  ?? Do not drive for 24 hours or as directed.  ?? Do not drink alcohol while taking pain medicine.  ?? Do not use tampons, douche, or have sexual intercourse for 2 weeks or until your health care provider says it is okay.  ?? Take your temperature twice a day for 4-5 days. Write it down each time.  ?? Follow your health care provider's advice about diet, exercise, and lifting.  ?? If you develop constipation, you may:  ?? Take a mild laxative if your health care provider approves.  ?? Add bran foods to your diet.  ?? Drink enough fluids to keep your urine clear or pale yellow.  ?? Try to have someone with you or available to you for the first 24-48 hours, especially if you were given a general anesthetic.  ?? Follow up with your health care provider as directed.  Contact a health care provider if:  ?? You feel dizzy or lightheaded.  ?? You feel sick to your stomach (nauseous).  ?? You have abnormal vaginal discharge.  ?? You have a rash.  ?? You have pain that is not controlled with medicine.  Get help right away if:  ?? You   have bleeding that is heavier than a normal menstrual period.  ?? You have a fever.  ?? You have increasing cramps or pain, not controlled with medicine.  ?? You have new belly (abdominal) pain.  ?? You pass out.  ?? You have pain in the tops of your shoulders (shoulder strap areas).  ?? You have shortness of breath.  This information is not intended to replace advice given to you by your health care provider. Make sure you discuss any questions you have with your health care provider.  Document Released: 01/20/2013 Document Revised: 09/07/2015 Document Reviewed: 10/29/2012  Elsevier Interactive Patient Education ?? 2017 [employer]

## 2018-11-17 NOTE — TOC Discharge Planning (AHS/AVS) (Signed)
Anesthesia Transfer of Care Note    Patient: Maria Stevenson  Procedure(s) Performed: Procedure(s):  Hysteroscopic resection of fibroid    Patient location: PACU    Anesthesia type: general    Airway Device on Arrival to PACU/ICU: Room Air    IV Access: Peripheral    Monitors Recommended to be Used During PACU/ICU: Quarry manager Issues to Address: None    Level of Consciousness: awake    Post vital signs:    Vitals:    11/17/18 1400   BP: 125/72   Pulse: 90   Resp:    Temp: 98.9 F (37.2 C)   SpO2: 99%       Complications: None

## 2018-11-17 NOTE — Telephone Encounter (Addendum)
Called patient to check in after surgery. She states she is doing well, pain is 5/10 with period like cramping. She is having vaginal bleeding, most notable with position changes (sitting up in PACU, getting out of the car once at home). We discussed anticipated amount of bleeding and reasons to call/present to ER (saturating a heavy pad <1hr for 2 consecutive hours, symptoms of anemia such as chest pain, SOB with exertion, lightheadedness/dizziness). Patient verbalized understanding. Instructed her to continue NETA and LE as prescribed, as well as Fe supplements.   She denies any nausea/emesis. Voiding without difficulty.     Patient aware to call with any concerns or questions. Plan to follow up in office for post-op visit in 1 week. Patient excited to see the pictures from surgery.

## 2018-11-17 NOTE — Anesthesia Pre-Procedure Evaluation (Signed)
[facility]  DEPARTMENT OF ANESTHESIOLOGY  PRE-PROCEDURAL EVALUATION    Maria Stevenson is a 33 y.o. year old female presenting for:    Procedure(s):  Hysteroscopic resection of fibroid    Surgeon:   Ali Lowe, MD    Chief Complaint     Uterine leiomyoma, unspecified location [D25.9]    Review of Systems     Anesthesia Evaluation    Patient summary reviewed.       No history of anesthetic complications   I have reviewed the History and Physical Exam, any relevant changes are noted in the anesthesia pre-operative evaluation.      Cardiovascular:    Exercise tolerance:  Duke Met score: 5 - Walking four miles per hour. Social dancing. Washing a car.  (-) hypertension, past MI, CABG/stent, dysrhythmias, angina, orthopnea.    Neuro/Muscoloskeletal/Psych:      (-) seizures, TIA, CVA.     Pulmonary:    (+) asthma (albuterol prn, not used since spring).    (-) no pneumonia, COPD, recent URI.       GI/Hepatic/Renal:      (-) GERD, hepatitis, liver disease, renal disease.    Endo/Other:    (+) hypothyroidism.  Diabetes, type 2.    (-) hyperthyroidism, no anemia, no thrombocytopenia, no bleeding disorder, no DVT, no clotting disorder.       Past Medical History     Past Medical History:   Diagnosis Date    Asthma     Dermatitis     DM (diabetes mellitus), type 2 (CMS Dx)     Hypothyroid     Varicella        Past Surgical History     Past Surgical History:   Procedure Laterality Date    CYST REMOVAL         Family History     Family History   Problem Relation Age of Onset    Melanoma Neg Hx        Social History     Social History     Socioeconomic History    Marital status: Single     Spouse name: Not on file    Number of children: Not on file    Years of education: Not on file    Highest education level: Not on file   Occupational History    Not on file   Social Needs    Financial resource strain: Not on file    Food insecurity     Worry: Not on file     Inability: Not on file    Transportation needs      Medical: Not on file     Non-medical: Not on file   Tobacco Use    Smoking status: Never Smoker    Smokeless tobacco: Never Used   Substance and Sexual Activity    Alcohol use: No    Drug use: Never    Sexual activity: Yes     Partners: Male   Lifestyle    Physical activity     Days per week: Not on file     Minutes per session: Not on file    Stress: Not on file   Relationships    Social connections     Talks on phone: Not on file     Gets together: Not on file     Attends religious service: Not on file     Active member of club or organization: Not on file  Attends meetings of clubs or organizations: Not on file     Relationship status: Not on file    Intimate partner violence     Fear of current or ex partner: Not on file     Emotionally abused: Not on file     Physically abused: Not on file     Forced sexual activity: Not on file   Other Topics Concern    Caffeine Use Yes    Occupational Exposure Not Asked    Exercise No    Seat Belt Yes   Social History Narrative    Not on file       Medications     Allergies:  Allergies   Allergen Reactions    Semaglutide Hives    Lisinopril     Peanut Swelling     Throat and tongue itch, angioedema of face  Throat and tongue itch, angioedema of face      Victoza [Liraglutide]      hives       Home Meds:  Prior to Admission medications as of 11/12/18 1539   Medication Sig Taking?   albuterol (ACCUNEB) 0.63 mg/3 mL nebulizer solution Inhale 1 ampule by nebulization every 6 hours as needed. Yes   insulin degludec/liraglutide (XULTOPHY 100/3.6 SUBQ) Inject 20 Units subcutaneously. Yes   elagolix 200 mg Tab Take 200 mg by mouth daily.    hydrocortisone 1 % cream Apply topically 2 times a day.    letrozole (FEMARA) 2.5 mg tablet Take 1 tablet (2.5 mg total) by mouth daily. Indications: fibroids    leuprolide (LUPRON DEPOT) 3.75 mg injection Inject 3.75 mg into the muscle every 28 days.    leuprolide (LUPRON) 3.75 mg injection Inject 3.75 mg into the muscle every  28 days.    levothyroxine (SYNTHROID, LEVOTHROID) 50 MCG tablet Take 50 mcg by mouth daily.    lidocaine (XYLOCAINE) 5 % ointment Apply topically as needed.    losartan (COZAAR) 25 MG tablet Take 25 mg by mouth daily.    metFORMIN (GLUCOPHAGE) 500 MG tablet Take 1,000 mg by mouth 2 times a day with meals.           montelukast (SINGULAIR) 10 mg tablet Take 10 mg by mouth at bedtime (2100).    norethindrone (AYGESTIN) 5 mg tablet Take 1 tablet (5 mg total) by mouth daily.    simvastatin (ZOCOR) 40 MG tablet Take 40 mg by mouth at bedtime.    valACYclovir (VALTREX) 500 MG tablet Take 500 mg by mouth 2 times a day.        Inpatient Meds:  Scheduled:   Continuous:     PRN:     Vital Signs     Wt Readings from Last 3 Encounters:   11/13/18 202 lb (91.6 kg)   03/17/18 205 lb (93 kg)   02/10/18 205 lb (93 kg)     Ht Readings from Last 3 Encounters:   11/13/18 5' 4 (1.626 m)   03/17/18 5' 4 (1.626 m)   02/03/18 5' 4 (1.626 m)     Temp Readings from Last 3 Encounters:   No data found for Temp     BP Readings from Last 3 Encounters:   11/13/18 124/78   03/17/18 118/70   02/03/18 118/76     Pulse Readings from Last 3 Encounters:   No data found for Pulse     @LASTSAO2 (3)@    Physical Exam     Airway:     Mallampati: III  Mouth Opening: >2 FB  TM distance: > = 3 FB  Neck ROM: full    Dental:   - No obvious cracked, loose, chipped, or missing teeth.     Pulmonary:       Breath sounds clear to auscultation.       Cardiovascular:     Rhythm: regular  Rate: normal    Neuro/Musculoskeletal/Psych:    Mental status: alert and oriented to person, place and time.          Abdominal:     Obese.    Current OB Status:       Other Findings:        Laboratory Data     Lab Results   Component Value Date    WBC 9.2 10/20/2018    HGB 8.3 (L) 10/20/2018    HCT 28.1 (L) 10/20/2018    MCV 76.2 (L) 10/20/2018    PLT 419 (H) 10/20/2018       No results found for: [facility]    Lab Results   Component Value Date    GLUCOSE 111 (H) 11/13/2018    BUN 11  11/13/2018    CO2 24 11/13/2018    CREATININE 0.70 11/13/2018    K 4.1 11/13/2018    NA 140 11/13/2018    CL 107 11/13/2018    CALCIUM 9.9 11/13/2018    ALBUMIN 4.3 11/13/2018    PROT 7.8 11/13/2018    ALKPHOS 94 11/13/2018    ALT 12 11/13/2018    AST 16 11/13/2018    BILITOT 0.9 11/13/2018       No results found for: PTT, INR    No results found for: PREGTESTUR, PREGSERUM, HCG, HCGQUANT    Anesthesia Plan     ASA 3         Female and current non-smoker    Anesthesia Type:  general.      PONV Risk Factors: female, current non-smoker,  plan for postoperative opioid use.                    Anesthetic plan and risks discussed with patient.    Plan, alternatives, and risks of anesthesia, including death, have been explained to and discussed with the patient/legal guardian.  By my assessment, the patient/legal guardian understands and agrees.  Scenario presented in detail.  Questions answered.    Blood products not discussed.      Plan discussed with attending and CRNA.

## 2018-11-17 NOTE — OR Nursing (Addendum)
Patient out from OR  Operative site internal has peri pad mesh pants in place and foley  Verbal orders to discontinue to the foley, 10 cc out of balloon and 1300 cc of yellow urine  Patient complaint of being cold placed on Bair hugger  Tolerating ice chips  FSBS at 113 after surgery  Advanced to apple juice and cookies  Call out to Dr Rosalita Chessman for sign out to SDS    B/P  124/78  B/P  111/68      Faxed toSDS  Call out to [facility]  Spoke with

## 2018-11-17 NOTE — Other (Signed)
INTRA-OP POST BRIEFING NOTE: Maria Stevenson      Specimens:   Specimens     ID Source Type Tests Collected By Collected At Frozen? Attributes Order ID Breast Spec Formalin Marked as Sent    A Uterus Tissue  SURGICAL PATHOLOGY Starlyn Skeans, MD 11/17/18 1449 No Sent in Formalin  [id number]    11/17/18 1512    Comment: UTERINE FIBROID          Prior to leaving the room: Nurse confirmed name of procedure, completion of instrument, sponge & needle counts, reads specimen labels aloud including patient name and addresses any equipment issues? Nurse confirmed wound class. Nurse to surgeon and anesthesia: What are key concerns for recovery and management of the patient?  Yes      Blood products stored at appropriate temperatures prior to return to blood bank (if applicable)? N/A      Patient identification band secured on patient prior to transfer out of the operating room? Yes    Temporary devices implanted for the duration of the surgery removed and evaluated for intactness and completeness prior to closure? N/A      Other Comments:     Signed: Francoise Schaumann Saida Lonon    Date: 11/17/2018    Time: 3:12 PM

## 2018-11-17 NOTE — Progress Notes (Signed)
Pt. Discharged per wheelchair to exit.    Pad & Mesh panties CDI      Discharge Pain level-6 Patient states pain is tolerable. Pain management as documented.    Patient and family verbalized understanding of discharge instructions.      Vitals:    11/17/18 1730   BP: 112/64   Pulse: 96   Resp: 29   Temp: 97.5 F (36.4 C)   SpO2: 97%             I/O this shift:  In: -   Out: 1300 [Urine:1300]      Wasim Hurlbut LOUISA Simuel Stebner

## 2018-11-18 NOTE — Telephone Encounter (Signed)
Scheduled post op with Dr. Maisie Fus for Tuesday Aug. 18th.  Patient verbalized understanding.

## 2018-11-18 NOTE — Telephone Encounter (Signed)
Pt had surgical resection of fibroid yesterday.  She is just calling to find out when she should schedule follow up visit and to get scheduled.

## 2018-11-19 NOTE — Telephone Encounter (Signed)
Pt Pharmacy calling needing authorization for a script please give them a call the call back number is [phone] option #3

## 2018-11-19 NOTE — Telephone Encounter (Signed)
Advised pharmacy to dc lupron as patient had surgery for fibroid and is no longer needed. [name]

## 2018-11-30 NOTE — Telephone Encounter (Signed)
Spoke with [employer], advised that Pollie Friar will not be needed as patient had surgery.

## 2018-11-30 NOTE — Unmapped (Signed)
[facility] states [place] was denied  They are wanting to know if an appeal will completed  Please call [phone] option 3  Please call [facility] if this is going to be done or not

## 2018-12-01 ENCOUNTER — Ambulatory Visit: Admit: 2018-12-01 | Discharge: 2018-12-01 | Payer: PRIVATE HEALTH INSURANCE | Primary: Oncology

## 2018-12-01 ENCOUNTER — Ambulatory Visit: Payer: PRIVATE HEALTH INSURANCE | Primary: Oncology

## 2018-12-01 DIAGNOSIS — D500 Iron deficiency anemia secondary to blood loss (chronic): Secondary | ICD-10-CM

## 2018-12-01 LAB — CBC
Hematocrit: 36.4 % (ref 35.0–45.0)
Hemoglobin: 10.7 g/dL — ABNORMAL LOW (ref 11.7–15.5)
MCH: 22 pg — ABNORMAL LOW (ref 27.0–33.0)
MCHC: 29.4 g/dL — ABNORMAL LOW (ref 32.0–36.0)
MCV: 74.9 fL — ABNORMAL LOW (ref 80.0–100.0)
Platelets: 419 Thousand/uL — ABNORMAL HIGH (ref 140–400)
RBC: 4.86 Million/uL (ref 3.80–5.10)
RDW: 21.8 % — ABNORMAL HIGH (ref 11.0–15.0)
WBC: 8.1 Thousand/uL (ref 3.8–10.8)

## 2018-12-01 NOTE — Progress Notes (Signed)
CC: Post Operative Visit    HPI: Maria Stevenson is a 33 y.o. G2 P2 with large submucosal fibroid and menorrhagia.    Post operative visit from [facility] partial resection of submucosal fibroid on 11/17/18.   POD # 14  Patient is continuing on her NETA and LE. She reports light bleeding. She goes through 3 pads a day. The bleeding is sometimes dark but has not stopped. Pain minimal.  Tolerating PO.  Regular bowel movements. No vaginal bleeding.   Reviewed findings and pictures: >50% of fibroid removed. Pathology benign.      ROS:   General: denies fever/chills, night sweats, weight loss/gain, fatigue  Respiratory: all negative  Cardiovasculal: all negative  Gastrointestinal: all negative  Musculoskeletal : all negative  Genito-Urinary : light VB, no issues with urinating     BP 112/76   Ht 5' 4 (1.626 m)   Wt 202 lb (91.6 kg)   LMP 11/12/2018   BMI 34.67 kg/m     PE:  General appearance - alert, well appearing, and in no distress  Eyes - pupils equal and reactive, extraocular eye movements intact  Neck - supple, no significant adenopathy  Chest - no tachypnea, retractions or cyanosis  Heart - normal rate and regular rhythm  Abdomen - soft, nontender, nondistended, no masses or organomegaly  Extremities - peripheral pulses normal, no pedal edema, no clubbing or cyanosis  Skin - normal coloration and turgor, no rashes, no suspicious skin lesions noted    ASSESSMENT:  32 y.o. [id number]  S/p HSC partial resection of large submucosal fibroid 11/17/18, healing well.   T2DM, HLD, Hypothyroidism, Asthma     PLAN:  - CBC today  - Continue NETA 10mg , LE 2.5mg  BID  - Plan for SIS in 3 weeks to assess cavity in prep for repeat surgery. Written information provided for scheduling SIS.     I personally saw, examined, and counseled the patient with the fellow and agree with the documented assessment and plan above.      Norva Pavlov, MD    Time Spent With Patient:   I spent a total of 20 minutes of which 15 minutes was spent in  counseling and/or coordination of care with patient and/or family. Topics discussed include submucous fibroid and irreg bleeding    I saw and evaluated the patient. Agree with resident's note.  Will continue per plan as described above.  I performed the entire procedure  Casimiro Needle A. Maisie Fus, M.D.

## 2018-12-02 NOTE — Telephone Encounter (Signed)
Called patient with CBC results: Hgb 10.7 (pre-op hgb 9.3). Continue Fe supplementation. Patient without questions.

## 2018-12-02 NOTE — Telephone Encounter (Signed)
Talked to patient  Hb 10.7  RTC 3 weeks for SIS

## 2018-12-22 NOTE — Telephone Encounter (Signed)
Pt scheduled Tuesday with Dr. Maisie Fus. Cycle day 11.

## 2018-12-22 NOTE — Telephone Encounter (Signed)
Pt calling to schedule SIS. LMP is 12/18/18.

## 2018-12-23 MED ORDER — METFORMIN HCL 500 MG PO TABS
500 | ORAL_TABLET | ORAL | 0 refills | Status: DC
Start: 2018-12-23 — End: 2019-09-06

## 2018-12-23 NOTE — Telephone Encounter (Signed)
 Last OV: 02/16/2018

## 2018-12-24 MED ORDER — LEVOTHYROXINE SODIUM 50 MCG PO TABS
50 | ORAL_TABLET | ORAL | 1 refills | Status: DC
Start: 2018-12-24 — End: 2019-03-31

## 2018-12-29 ENCOUNTER — Ambulatory Visit: Admit: 2018-12-29 | Discharge: 2018-12-29 | Payer: PRIVATE HEALTH INSURANCE | Primary: Oncology

## 2018-12-29 DIAGNOSIS — N920 Excessive and frequent menstruation with regular cycle: Secondary | ICD-10-CM

## 2018-12-29 NOTE — Unmapped (Signed)
CC:  Follow up, cavity assessment     HPI: 33 y.o. G20P2002 female with history of large submucosal fibroid, partially resected via HSC myomectomy on 11/17/2018.   Since surgery patient reports doing well. She had a heavy period a few weeks ago but denies any bleeding since. She is taking NETA 10mg  and LE 2.5mg  BID.     Pertinent History:  S/p HSC partial resection of large submucosal fibroid 11/17/18  T2DM, HLD, Hypothyroidism, Asthma     Review of Systems:  The following systems were reviewed and negative, except those noted in HPI: General, cardiovascular, respiratory, gastrointestinal, genitourinary, musculoskeletal, skin, neurologic, psychiatric, endocrine, heme, allergy    Objective:  There were no vitals filed for this visit.  General: No acute distress, alert & oriented x 3  Head: normocephalic, pupils equal  Extremities: no significant edema  Skin: no rash or lesion  Pelvic: see procedure note below     Results Review:     Lab Results   Component Value Date    HGB 10.7 (L) 12/01/2018    HCT 36.4 12/01/2018    CREATININE 0.70 11/13/2018    POCTAMH 4.29 02/04/2018    POCTPRLCTIN 16.92 02/04/2018    POCTSH 3.17 10/20/2018    HGBA1C 8.2 (H) 11/13/2018     No results found for: RUBELLAIGG, RUBNUM, VARICELLAIGG, HIV12ABAGN, HEPBSAG, HCVAB, RPR, LABRPR, TREPIA, CFRESULT, SMN1, SMN2, TECHRESULTS   Assessment:   33 y.o. [id number]   S/p HSC partial resection of large submucosal fibroid 11/17/18   T2DM, HLD, Hypothyroidism, Asthma   Irregular bleeding    Plan:   - Plan for repeat HSC myomectomy, likely beginning of October. RTO for pre-op visit. Surgery will likely be with other partner due to scheduling (patient wants to avoid first week of month as this is generally when she has menses).   - Patient to work on BG control     I spent a total of 20 minutes of which 15 minutes was spent in counseling and/or coordination of care with patient and/or family. Topics discussed include submucous fibroid and irregular bleeding    I  saw and evaluated the patient. Agree with resident's note.  Will continue per plan as described above.  I performed the entire procedure  Casimiro Needle A. Maisie Fus, M.D.      SIS - Procedure Note    Indication/Diagnosis  Large submucosal fibroid, partially resected 11/17/2018 w/ [facility] myomectomy  Ordering Provider  Dr. Maisie Fus     Patient's last menstrual period was 12/18/2018.   Urine HCG test (today): negative    Informed Consent  The risks, benefits, and alternatives were discussed with the patient; including but not limited to bleeding, infection and damage to surrounding structures/perforation, or disruption of occult pregnancy.  She acknowledged these risks chose to proceed.     Procedure in Detail  Patient was placed in the dorsal lithotomy position.  A speculum was inserted into the vagina.  The cervix was identified, no concerning lesions were noted.  The cervix was cleansed.  The sonohystergram catheter was inserted using sterile technique and inflated. The speculum was removed and the transvaginal ultrasound probe inserted.    Saline infusion sonogram  Sterile saline was injected through the [facility] catheter. Distention was adequate.  The ultrasound images were obtained, then the ultrasound probe removed. The catheter was removed from the uterus and discarded.    Balloon Catheter:  Yes  Saline:  Yes  Tenaculum:  No  Betadine Prep:  Yes  NSAID (pre-procedure):  Yes  Cytotec: No  Complications:  none    FINDINGS:  Uterus:  Normal contour.  Uterine Cavity:  Abnormal - submucosal fibroid remaining measuring 23 x 20cm.     IMPRESSION:   Small remaining portion of anterior submucosal fibroid  Normal uterine contour    I saw and evaluated the patient. Agree with resident's note.  Will continue per plan as described above.  I performed the entire procedure  Casimiro Needle A. Maisie Fus, M.D.

## 2019-01-04 MED ORDER — XULTOPHY 100-3.6 UNIT-MG/ML SC SOPN
100-3.6 | PEN_INJECTOR | SUBCUTANEOUS | 3 refills | Status: DC
Start: 2019-01-04 — End: 2019-06-24

## 2019-01-04 NOTE — Telephone Encounter (Signed)
Last seen 02/16/18  Last seen NP 11/12/18

## 2019-01-05 NOTE — Unmapped (Signed)
-----   Message from Norva Pavlov, MD sent at 01/04/2019  5:27 PM EDT -----  Can someone please call this patient on 9/22 to schedule a pre-op visit with Dr. Doreene Adas per Dr. Maurine Minister request?   Thank you!   ----- Message -----  From: Eliberto Ivory, MD  Sent: 01/04/2019   8:10 AM EDT  To: [facility]    Please schedule pre-op with Dr. Doreene Adas.   ----- Message -----  From: Ali Lowe, MD  Sent: 12/29/2018   8:34 PM EDT  To: Eliberto Ivory, MD    Dr. Doreene Adas can you do this HSC to remove remaining submucous fibroid.  Shrank from 10 cm to 3 cm.  Wants procedure in October.  Philipp Deputy can you bring her in for preop with Doreene Adas

## 2019-01-05 NOTE — Telephone Encounter (Signed)
Called pt to schedule pre-op, LVM to call us back

## 2019-01-27 ENCOUNTER — Ambulatory Visit
Admit: 2019-01-27 | Discharge: 2019-01-27 | Payer: PRIVATE HEALTH INSURANCE | Attending: Reproductive Endocrinology | Primary: Oncology

## 2019-01-27 DIAGNOSIS — N939 Abnormal uterine and vaginal bleeding, unspecified: Secondary | ICD-10-CM

## 2019-01-27 MED ORDER — norethindrone (AYGESTIN) 5 mg tablet
5 | ORAL_TABLET | Freq: Every day | ORAL | 3 refills | Status: AC
Start: 2019-01-27 — End: 2021-05-01

## 2019-01-27 NOTE — Telephone Encounter (Signed)
 From: [name]  To: [name], [name]  Sent: 01/27/2019 3:41 PM EDT  Subject: Non-Urgent Medical Question    Hello I don't know if you remembered me but is it possible to come see you for another pre op appointment soon??

## 2019-01-27 NOTE — Unmapped (Signed)
CC:  Pre-OP H&P    HPI: 33 y.o. G27P2002 female with history of large submucosal fibroid, partially resected via HSC myomectomy on 11/17/2018.   Since surgery patient reports doing well.  She is taking NETA 5 mg and LE 2.5 mg one tablet daily.  Reports starting her period approximately 2 weeks ago and has continued to have irregular bleeding.      She underwent repeat SIS on 12/29/2018 which found small remaining portion of anterior submucosal fibroid, normal uterine contour. Remaining fibroid measured 23 x 20 mm.      Pertinent History:  S/p HSC partial resection of large submucosal fibroid 11/17/18  T2DM, HLD, Hypothyroidism, Asthma     Past Medical History:   Diagnosis Date   ??? Asthma    ??? Dermatitis    ??? DM (diabetes mellitus), type 2 (CMS Dx)    ??? Hypothyroid    ??? Varicella      Past Surgical History:   Procedure Laterality Date   ??? CYST REMOVAL     ??? HYSTEROSCOPY N/A 11/17/2018    Procedure: Hysteroscopic resection of fibroid;  Surgeon: Ali Lowe, MD;  Location: [facility] OR;  Service: Gynecology;  Laterality: N/A;   ??? WISDOM TOOTH EXTRACTION       Family History   Problem Relation Age of Onset   ??? Diabetes Mother    ??? Diabetes Father    ??? Melanoma Neg Hx        Current Outpatient Medications:   ???  ergocalciferol, vitamin D2, (VITAMIN D ORAL), Take by mouth., Disp: , Rfl:   ???  ferrous sulfate/vit C/folic ac (FERROUS SULFATE-C-FOLIC ACID ORAL), Take by mouth., Disp: , Rfl:   ???  insulin degludec/liraglutide (XULTOPHY 100/3.6 SUBQ), Inject 20 Units subcutaneously., Disp: , Rfl:   ???  letrozole (FEMARA) 2.5 mg tablet, Take 1 tablet (2.5 mg total) by mouth daily. Indications: fibroids, Disp: 30 tablet, Rfl: 11  ???  levothyroxine (SYNTHROID, LEVOTHROID) 50 MCG tablet, Take 50 mcg by mouth daily., Disp: , Rfl:   ???  losartan (COZAAR) 25 MG tablet, Take 25 mg by mouth daily., Disp: , Rfl:   ???  metFORMIN (GLUCOPHAGE) 500 MG tablet, Take 1,000 mg by mouth 2 times a day with meals.    , Disp: , Rfl:   ???  montelukast (SINGULAIR) 10  mg tablet, Take 10 mg by mouth at bedtime (2100)., Disp: , Rfl:   ???  simvastatin (ZOCOR) 40 MG tablet, Take 40 mg by mouth at bedtime., Disp: , Rfl:   ???  albuterol (ACCUNEB) 0.63 mg/3 mL nebulizer solution, Inhale 1 ampule by nebulization every 6 hours as needed., Disp: , Rfl:   ???  hydrocortisone 1 % cream, Apply topically 2 times a day., Disp: , Rfl:   ???  ibuprofen (MOTRIN) 600 MG tablet, Take 1 tablet (600 mg total) by mouth every 6 hours as needed for Pain., Disp: 30 tablet, Rfl: 0  ???  lidocaine (XYLOCAINE) 5 % ointment, Apply topically as needed., Disp: , Rfl:   ???  multivit-min/ferrous fumarate (MULTI VITAMIN ORAL), Take by mouth., Disp: , Rfl:   ???  norethindrone (AYGESTIN) 5 mg tablet, Take 2 tablets (10 mg total) by mouth daily., Disp: 60 tablet, Rfl: 3  ???  valACYclovir (VALTREX) 500 MG tablet, Take 500 mg by mouth 2 times a day., Disp: , Rfl:     Allergies   Allergen Reactions   ??? Semaglutide Hives   ??? Lisinopril    ??? Peanut Swelling  Throat and tongue itch, angioedema of face  Throat and tongue itch, angioedema of face     ??? Victoza [Liraglutide]      hives       Review of Systems:  The following systems were reviewed and negative, except those noted in HPI: General, cardiovascular, respiratory, gastrointestinal, genitourinary, musculoskeletal, skin, neurologic, psychiatric, endocrine, heme, allergy    Objective:  Vitals:    01/27/19 1432   Weight: 200 lb (90.7 kg)     General: No acute distress, alert & oriented x 3  Head: normocephalic, pupils equal  Extremities: no significant edema  Skin: no rash or lesion  Pelvic: see procedure note below     Results Review:     Lab Results   Component Value Date    HGB 10.7 (L) 12/01/2018    HCT 36.4 12/01/2018    CREATININE 0.70 11/13/2018    POCTAMH 4.29 02/04/2018    POCTPRLCTIN 16.92 02/04/2018    POCTSH 3.17 10/20/2018    HGBA1C 8.2 (H) 11/13/2018     No results found for: RUBELLAIGG, RUBNUM, VARICELLAIGG, HIV12ABAGN, HEPBSAG, HCVAB, RPR, LABRPR, TREPIA,  CFRESULT, SMN1, SMN2, TECHRESULTS   Assessment:   33 y.o. [id number]   S/p HSC partial resection of large submucosal fibroid 11/17/18   T2DM, HLD, Hypothyroidism, Asthma   Abnormal uterine bleeding     Plan:   - Discussed current bleeding pattern on NETA 5 and LE 2.5.  Can try to increase to NETA 10 to see if bleeding symptoms improve.  Still reports almost monthly menses.  Also could try withdrawal bleed.  - [place] repeated today with thickened endometrium with visualization of remaining fibroid  - Recommend hysteroscopic myomectomy.  Discussed risks/benefits/alternatives.  Pt consented to the surgery and desires to have it scheduled.  Risks include bleeding, infection, uterine perforation, uterine scarring, need for further procedures, blood clots, injury to surrounding tissue/organs, fluid overload, disruption of unknown pregnancy.  Pt consents to blood transfusion if necessary to safe her life.  All questions answered, consent signed.  - Pt reports has not seen PCP who manages DM in a long time.  Recommended scheduling follow-up visit for H&P and discuss clearance for surgery/medical management.  Likely plan to hold AM insulin, metformin  - Will try to see if available on Wednesday 11/4.  If not available will likely need to reschedule in December (12/16).  Will need to rule out possibility of pregnancy prior to surgery.        Rolley Sims, MD    Time Spent With Patient:   I spent a total of 25 minutes with patient/couple and >= 50% of time was spent discussing/counseling regarding problem, patient education, management options and treatment plan as documented above.     Procedure:  GYN Ultrasound    No LMP recorded.     Type of Ultrasound:  Transvaginal    Uterus:  106.19 x 72.6 mm; Endometrial thickness:  8.75 mm     Uterine Pathology: FIbroid measuring 40.6 x 41.52 x 38.08 mm     Right Ovary:  22.13 x 22.43 x 20.48 mm    Left Ovary:  31.37 x 22.30 x 18.43 mm    Impression:   Normal appearing ovaries bilaterally,  resting; thickened heterogeneous endometrium, large intramural/submucosal fibroid     Rolley Sims, MD  Reproductive Endocrinology & Infertility

## 2019-01-27 NOTE — Unmapped (Signed)
Surgery/Procedure Scheduling Form    ALLIX BLOMQUIST   [address]  [place] [address]     Date of Birth:  December 08, 1985  MRN:  [id number]   Sex:  female  Phone Number:  [phone] (home)       PROCEDURE INFORMATION    Procedure Date:  02/17/2019  Alternate Date:  03/31/2019 (would need repeat visit to sign consent)    Surgery Time:  TBD  Length of Surgery:  1 hour(s)    PROCEDURE:  Hysteroscopic myomectomy  DIAGNOSIS:  Abnormal uterine bleeding - leiomyoma     Size/Depth:  n/a  Anatomical Location:  n/a    Surgeon:  Doristine Church, MD   Co-Surgeon:      Assistant:     Resident:      FACILITY/ADMIT STATUS    Facility:  [facility] Type:  Private    Patient will go to:  PACU    Admit Status:  Outpatient    ANESTHESIA INFORMATION    Anesthesia Type:  Choice    Post-Op Epidural:  No    TAP Blocks:  No    Position:  Lithotomy    Special Equipment/Supplies Needed:  Myosure    Allergies   Allergen Reactions   ??? Semaglutide Hives   ??? Lisinopril    ??? Peanut Swelling     Throat and tongue itch, angioedema of face  Throat and tongue itch, angioedema of face     ??? Victoza [Liraglutide]      hives       Latex Sensitive:  No    PRE-ADMISSION TESTING    Patient does not need PAT appointment.    Pre-Admission Testing done at:    Date:    Time:    H&P Appointment at:  PCP  Date:  TBD Time:      INSURANCE INFORMATION      PRIMARY INSURANCE   Payor: @RFLCVGPAYOR @  Plan: @RFLCVGPLAN @    Group Number: @[id number] @  Insurance Type:    Subscriber Name: @SUBSCRIBERNAME @  Subscriber DOB:     Subscriber ID: @[id number] @  Pat. Rel. to Subscriber:    SECONDARY INSURANCE   Payor:  Plan:    Group Number:  Insurance Type:    Subscriber Name:  Subscriber DOB:    Subscriber ID: @SUBIDSECONDARY @  Garment/textile technologist. Rel. to Subscriber:

## 2019-02-02 NOTE — Telephone Encounter (Signed)
Pt calling to follow up on surgery to see if it has been scheduled yet.

## 2019-02-02 NOTE — Telephone Encounter (Signed)
Spoke with patient, advised that surgery date/time has not been confirmed yet.. Will reach out to surgery scheduler for update.

## 2019-02-09 NOTE — Telephone Encounter (Signed)
Called and spoke to patient today at 3:15 PM.    Confirmed surgery with patient that is scheduled at [facility] on 02-17-19 at 12:00 PM with Dr. Doreene Adas.  Confirmed her check-in time of 10:00 AM at [facility].  Advised patient of NPO status.    Explained that the pre-admission office at [facility] should be calling to go over their instructions and to schedule her for a COVID test.  Asked patient that if she hasn't heard anything by tomorrow afternoon to call us and let us know.  She agreed.

## 2019-02-10 ENCOUNTER — Ambulatory Visit: Admit: 2019-02-10 | Discharge: 2019-02-10 | Payer: PRIVATE HEALTH INSURANCE | Attending: Oncology | Primary: Oncology

## 2019-02-10 DIAGNOSIS — I10 Essential (primary) hypertension: Secondary | ICD-10-CM

## 2019-02-10 MED ORDER — KROGER PEN NEEDLES 31G X 8 MM MISC
Freq: Every day | 3 refills | 30.00000 days | Status: AC
Start: 2019-02-10 — End: ?

## 2019-02-10 MED ORDER — ALBUTEROL SULFATE HFA 108 (90 BASE) MCG/ACT IN AERS
108 | Freq: Four times a day (QID) | RESPIRATORY_TRACT | 5 refills | 25.00000 days | Status: DC | PRN
Start: 2019-02-10 — End: 2019-04-26

## 2019-02-10 NOTE — Assessment & Plan Note (Signed)
 Stable, controlled   Continue Singulair  and zyrtec   Albuterol  prn

## 2019-02-10 NOTE — Telephone Encounter (Signed)
Dupuyer asking for sig clarification for Insulin Pen Needle ([employer] PEN NEEDLES 31G) 31G X 8 MM MISC

## 2019-02-10 NOTE — Assessment & Plan Note (Signed)
 Following with Gynecology  Surgery as planned

## 2019-02-10 NOTE — Assessment & Plan Note (Signed)
Stable, controlled.  Continue current regimen 

## 2019-02-10 NOTE — Progress Notes (Signed)
 Subjective:     Shelly Richard presents to the office today for a preoperative consultation at the request of surgeon Dr. Tressia who plans on performing HYSTEROSCOPIC DILATION AND CURETTAGE; MYOMECTOMY on February 17, 2019.  TPlanned anesthesia is General. The patient has the following known anesthesia issues: none  Patient has a bleeding risk of : no recent abnormal bleeding, no remote history of abnormal bleeding.    Patient's medications, allergies, past medical, surgical,social and family histories were reviewed and updated as appropriate.    Past Medical History:   Diagnosis Date    Allergies     Asthma     Diabetes mellitus (HCC)     Genital herpes     HTN (hypertension)     Hyperlipidemia     Hypothyroid     Urticaria      Past Surgical History:   Procedure Laterality Date    CYST REMOVAL       Social History     Socioeconomic History    Marital status: Single     Spouse name: Not on file    Number of children: Not on file    Years of education: Not on file    Highest education level: Not on file   Occupational History    Not on file   Social Needs    Financial resource strain: Not on file    Food insecurity     Worry: Not on file     Inability: Not on file    Transportation needs     Medical: Not on file     Non-medical: Not on file   Tobacco Use    Smoking status: Never Smoker    Smokeless tobacco: Never Used   Substance and Sexual Activity    Alcohol use: No    Drug use: No    Sexual activity: Yes     Partners: Male   Lifestyle    Physical activity     Days per week: Not on file     Minutes per session: Not on file    Stress: Not on file   Relationships    Social connections     Talks on phone: Not on file     Gets together: Not on file     Attends religious service: Not on file     Active member of club or organization: Not on file     Attends meetings of clubs or organizations: Not on file     Relationship status: Not on file    Intimate partner violence     Fear of current or  ex partner: Not on file     Emotionally abused: Not on file     Physically abused: Not on file     Forced sexual activity: Not on file   Other Topics Concern    Not on file   Social History Narrative    Not on file       Review of Systems  Review of Systems   Constitutional: Negative for chills, fatigue and fever.   HENT: Negative for congestion, sinus pressure and sinus pain.    Respiratory: Negative for cough, shortness of breath and wheezing.    Cardiovascular: Negative for chest pain and palpitations.   Gastrointestinal: Negative for abdominal pain, constipation and diarrhea.   Musculoskeletal: Negative for arthralgias, back pain and myalgias.   Skin: Negative for color change, pallor and rash.   Neurological: Negative for dizziness, syncope, weakness, light-headedness and headaches.  Psychiatric/Behavioral: Negative for behavioral problems, confusion and sleep disturbance. The patient is not nervous/anxious.             Objective:     Vitals:    02/10/19 1434   BP: 122/80   Pulse: 105   Temp: 97.3 F (36.3 C)   SpO2: 97%        Physical Exam  Physical Exam  Constitutional:       Appearance: She is well-developed.   HENT:      Head: Normocephalic and atraumatic.   Eyes:      Conjunctiva/sclera: Conjunctivae normal.      Pupils: Pupils are equal, round, and reactive to light.   Neck:      Musculoskeletal: Normal range of motion and neck supple.      Thyroid: No thyromegaly.      Vascular: No JVD.   Cardiovascular:      Rate and Rhythm: Normal rate and regular rhythm.      Heart sounds: Normal heart sounds.   Pulmonary:      Effort: Pulmonary effort is normal. No respiratory distress.      Breath sounds: Normal breath sounds. No wheezing.   Musculoskeletal: Normal range of motion.         General: No deformity.   Skin:     General: Skin is warm and dry.      Capillary Refill: Capillary refill takes less than 2 seconds.   Neurological:      Mental Status: She is alert and oriented to person, place, and time.    Psychiatric:         Behavior: Behavior normal.         Lab Review   Lab Results   Component Value Date    NA 141 11/03/2018    K 4.3 11/03/2018    CL 107 11/03/2018    CO2 21 11/03/2018    BUN 11 11/03/2018    CREATININE 0.7 11/03/2018    GLUCOSE 228 11/03/2018    CALCIUM  9.6 11/03/2018     Lab Results   Component Value Date    WBC 9.5 11/12/2018    HGB 9.3 11/12/2018    HCT 30.7 11/12/2018    MCV 70.2 11/12/2018    PLT 437 11/12/2018         Medication Review  Current Outpatient Medications on File Prior to Visit   Medication Sig Dispense Refill    XULTOPHY  100-3.6 UNIT-MG/ML SOPN INJECT 16 UNITS  DAILY AS DIRECTED 3 pen 3    levothyroxine  (SYNTHROID ) 50 MCG tablet TAKE ONE TABLET BY MOUTH DAILY 30 tablet 1    metFORMIN  (GLUCOPHAGE ) 500 MG tablet TAKE TWO TABLETS BY MOUTH TWICE A DAY WITH MEALS 270 tablet 0    ferrous sulfate  (IRON  325) 325 (65 Fe) MG tablet Take 1 tablet by mouth daily (with breakfast) 30 tablet 5    simvastatin  (ZOCOR ) 40 MG tablet TAKE ONE TABLET BY MOUTH DAILY 30 tablet 3    Cholecalciferol (VITAMIN D3) 50 MCG (2000 UT) TABS TAKE 1 TABLET BY MOUTH DAILY 100 tablet 0    Insulin  Pen Needle ([employer] PEN NEEDLES 29G) 29G X MISC 1 each by Does not apply route daily 100 each 3    montelukast  (SINGULAIR ) 10 MG tablet TAKE ONE TABLET BY MOUTH DAILY 30 tablet 2    Lancets MISC 1 each by Does not apply route daily Please provide lancets approved by insurance. 100 each 3    Blood Glucose  Monitoring Suppl [facility] BLOOD GLUCOSE METER) w/Device KIT Please provide meter approved by insurance.  Check bs qam 1 kit 0    blood glucose monitor strips Tes2t qam  times a day & as needed for symptoms of irregular blood glucose. Please provide test strips approved by insurance. 100 strip 2    cetirizine  (ZYRTEC ) 10 MG tablet TAKE 1 TABLET BY MOUTH DAILY 30 tablet 5    losartan  (COZAAR ) 25 MG tablet TAKE 1 TABLET BY MOUTH EVERY DAY 30 tablet 5    ibuprofen  (ADVIL ;MOTRIN ) 600 MG tablet Take 1 tablet  by mouth every 6 hours as needed for Pain 30 tablet 2    valACYclovir  (VALTREX ) 500 MG tablet TAKE 1 TABLET BY MOUTH DAILY (Patient not taking: Reported on 02/10/2019) 90 tablet 5    Lactobacillus (ACIDOPHILUS) TABS TAKE 1 TABLET BY MOUTH EVERY DAY (Patient not taking: Reported on 02/10/2019) 30 tablet 5     No current facility-administered medications on file prior to visit.        Assessment:       1. Essential hypertension    2. Preop examination    3. Morbidly obese (HCC)    4. Mild intermittent asthma without complication    5. Mixed hyperlipidemia    6. Acquired hypothyroidism    7. Type 2 diabetes mellitus without complication, without long-term current use of insulin  (HCC)           Plan:        Uterine fibroid  Following with Gynecology  Surgery as planned    Preop examination  Perioperative risk related to the patient's upcoming surgery is considered low. she is cleared for surgery.  Pre-op exam was completed on 02/10/19 10:20 PM.   EKG completed. No blocks or defects.   Labs reviewed     Essential hypertension  Stable, controlled   Continue current regimen    Mild intermittent asthma without complication  Stable, controlled   Continue Singulair  and zyrtec   Albuterol  prn      Mixed hyperlipidemia  Stable, controlled   Continue current regimen    Acquired hypothyroidism  Stable, controlled   Continue current regimen    Type 2 diabetes mellitus without complication (HCC)  Stable, controlled   Continue current regimen         - Preoperative workup as follows ECG  - Change in medication regimen before surgery: Hold meds morning of surgery, may take levothyroxine . Avoid NSAIDs 5 days prior to surgery   - Prophylaxisfor cardiac events with perioperative beta-blockers: should be considered, specific regimen per anesthesia

## 2019-02-10 NOTE — Assessment & Plan Note (Addendum)
 Perioperative risk related to the patient's upcoming surgery is considered low. she is cleared for surgery.  Pre-op exam was completed on 02/10/19 10:20 PM.   EKG completed. No blocks or defects.   Labs reviewed

## 2019-02-16 NOTE — Telephone Encounter (Signed)
Faxing EKG

## 2019-02-16 NOTE — Telephone Encounter (Signed)
 Asking for EKG to be faxed  Fax:[phone]    Faxed

## 2019-02-19 NOTE — Telephone Encounter (Signed)
Called pt to check in after surgery. No answer, left VM.

## 2019-03-07 ENCOUNTER — Encounter

## 2019-03-08 MED ORDER — MONTELUKAST SODIUM 10 MG PO TABS
10 | ORAL_TABLET | ORAL | 1 refills | 90.00000 days | Status: DC
Start: 2019-03-08 — End: 2019-07-22

## 2019-03-12 ENCOUNTER — Ambulatory Visit: Payer: PRIVATE HEALTH INSURANCE | Attending: Reproductive Endocrinology | Primary: Oncology

## 2019-03-15 ENCOUNTER — Ambulatory Visit
Admit: 2019-03-15 | Discharge: 2019-03-15 | Payer: PRIVATE HEALTH INSURANCE | Attending: Reproductive Endocrinology | Primary: Oncology

## 2019-03-15 DIAGNOSIS — Z9889 Other specified postprocedural states: Secondary | ICD-10-CM

## 2019-03-15 NOTE — Unmapped (Signed)
CC:  Post-op visit    HPI: 33 y.o. G44P2002 female with history of large submucosal fibroid, partially resected via HSC myomectomy on 11/17/2018, s/p repeat procedure - hysteroscopic D&C, polypectomy on 02/17/2019.      During the repeat procedure, a small posterior lower uterine polyp was visualized, however no evidence of intracavitary fibroid seen, even at low pressure.      Pathology:  FINAL DIAGNOSIS:  Clinically uterine polyp:  - ??Minute fragments of inactive appearing endometrium, negative for  malignancy.    Pt reports she is doing well.  Minimal bleeding after surgery, non since.  Has continued on medications.  Denies issues with n/v, diet, bowel/bladder.  No pain.    She is currently taking NETA 5 mg and LE 2.5 mg one tablet daily.      Pertinent History:  S/p HSC partial resection of large submucosal fibroid 11/17/18, HSC polypectomy 02/17/2019  T2DM, HLD, Hypothyroidism, Asthma     Past Medical History:   Diagnosis Date   ??? Asthma    ??? Dermatitis    ??? DM (diabetes mellitus), type 2 (CMS Dx)    ??? Hypothyroid    ??? Varicella      Past Surgical History:   Procedure Laterality Date   ??? CYST REMOVAL     ??? HYSTEROSCOPY N/A 11/17/2018    Procedure: Hysteroscopic resection of fibroid;  Surgeon: Ali Lowe, MD;  Location: [facility] OR;  Service: Gynecology;  Laterality: N/A;   ??? WISDOM TOOTH EXTRACTION       Family History   Problem Relation Age of Onset   ??? Diabetes Mother    ??? Diabetes Father    ??? Melanoma Neg Hx        Current Outpatient Medications:   ???  albuterol (ACCUNEB) 0.63 mg/3 mL nebulizer solution, Inhale 1 ampule by nebulization every 6 hours as needed., Disp: , Rfl:   ???  ergocalciferol, vitamin D2, (VITAMIN D ORAL), Take by mouth., Disp: , Rfl:   ???  ferrous sulfate/vit C/folic ac (FERROUS SULFATE-C-FOLIC ACID ORAL), Take by mouth., Disp: , Rfl:   ???  hydrocortisone 1 % cream, Apply topically 2 times a day., Disp: , Rfl:   ???  ibuprofen (MOTRIN) 600 MG tablet, Take 1 tablet (600 mg total) by mouth every 6 hours as  needed for Pain., Disp: 30 tablet, Rfl: 0  ???  insulin degludec/liraglutide (XULTOPHY 100/3.6 SUBQ), Inject 20 Units subcutaneously., Disp: , Rfl:   ???  letrozole (FEMARA) 2.5 mg tablet, Take 1 tablet (2.5 mg total) by mouth daily. Indications: fibroids, Disp: 30 tablet, Rfl: 11  ???  levothyroxine (SYNTHROID, LEVOTHROID) 50 MCG tablet, Take 50 mcg by mouth daily., Disp: , Rfl:   ???  lidocaine (XYLOCAINE) 5 % ointment, Apply topically as needed., Disp: , Rfl:   ???  losartan (COZAAR) 25 MG tablet, Take 25 mg by mouth daily., Disp: , Rfl:   ???  metFORMIN (GLUCOPHAGE) 500 MG tablet, Take 1,000 mg by mouth 2 times a day with meals.    , Disp: , Rfl:   ???  montelukast (SINGULAIR) 10 mg tablet, Take 10 mg by mouth at bedtime (2100)., Disp: , Rfl:   ???  multivit-min/ferrous fumarate (MULTI VITAMIN ORAL), Take by mouth., Disp: , Rfl:   ???  norethindrone (AYGESTIN) 5 mg tablet, Take 2 tablets (10 mg total) by mouth daily., Disp: 60 tablet, Rfl: 3  ???  simvastatin (ZOCOR) 40 MG tablet, Take 40 mg by mouth at bedtime., Disp: , Rfl:   ???  valACYclovir (VALTREX) 500 MG tablet, Take 500 mg by mouth 2 times a day., Disp: , Rfl:     Allergies   Allergen Reactions   ??? Semaglutide Hives   ??? Lisinopril    ??? Peanut Swelling     Throat and tongue itch, angioedema of face  Throat and tongue itch, angioedema of face     ??? Victoza [Liraglutide]      hives       Review of Systems:  The following systems were reviewed and negative, except those noted in HPI: General, cardiovascular, respiratory, gastrointestinal, genitourinary, musculoskeletal, skin, neurologic, psychiatric, endocrine, heme, allergy    Objective:  Vitals:    03/15/19 1357   BP: 128/80   Weight: 206 lb (93.4 kg)     General: No acute distress, alert & oriented x 3  Head: normocephalic, pupils equal  Chest:  CTAB  Heart:  RRR  Extremities: no significant edema  Skin: no rash or lesion  Abd:  Soft, nontender, obese    Results Review:     Lab Results   Component Value Date    HGB 10.7 (L)  12/01/2018    HCT 36.4 12/01/2018    CREATININE 0.70 11/13/2018    POCTAMH 4.29 02/04/2018    POCTPRLCTIN 16.92 02/04/2018    POCTSH 3.17 10/20/2018    HGBA1C 8.2 (H) 11/13/2018     No results found for: RUBELLAIGG, RUBNUM, VARICELLAIGG, HIV12ABAGN, HEPBSAG, HCVAB, RPR, LABRPR, TREPIA, CFRESULT, SMN1, SMN2, TECHRESULTS   Assessment:   33 y.o. [id number]   S/p HSC partial resection of large submucosal fibroid 11/17/18, HSC polypectomy 02/17/2019  T2DM, HLD, Hypothyroidism, Asthma   Abnormal uterine bleeding     Plan:   Reviewed surgical procedure and pathology report - copy of pictures and path report provided  Continue NETA/LE, discussed not FDA approved for contraception, LE can cause fetal harm if pregnant  Considering conceiving in future - recommend preconception visit   Otherwise RTO in 3-6 months     Rolley Sims, MD    Time Spent With Patient:   I spent a total of 15 minutes with patient/couple and >= 50% of time was spent discussing/counseling regarding problem, patient education, management options and treatment plan as documented above.

## 2019-03-22 NOTE — Telephone Encounter (Signed)
 From: [name]  To: [name], [name]  Sent: 03/22/2019 11:08 AM EST  Subject: Non-Urgent Medical Question    Hey I got sent home from work today because I'm not feeling well. I have a headache, body aches, and my eyes hurt and no fever. My job want to know what I should do.

## 2019-03-23 ENCOUNTER — Ambulatory Visit: Admit: 2019-03-23 | Discharge: 2019-03-23 | Payer: PRIVATE HEALTH INSURANCE | Attending: Family | Primary: Oncology

## 2019-03-23 ENCOUNTER — Encounter: Attending: Oncology | Primary: Oncology

## 2019-03-23 DIAGNOSIS — Z20822 Contact with and (suspected) exposure to covid-19: Secondary | ICD-10-CM

## 2019-03-23 NOTE — Progress Notes (Signed)
 Shelly Richard received a viral test for COVID-19. They were educated on isolation and quarantine as appropriate. For any symptoms, they were directed to seek care from their PCP, given contact information to establish with a doctor, directed to an urgent care or the emergency room.

## 2019-03-25 LAB — COVID-19: SARS-CoV-2, NAA: NOT DETECTED

## 2019-03-25 NOTE — Telephone Encounter (Signed)
 From: [name]  To: [name], [name]  Sent: 03/25/2019 4:32 PM EST  Subject: Non-Urgent Medical Question    Ok so no COVID detected but I still have a headache and body aches I need to know what to do?

## 2019-03-25 NOTE — Telephone Encounter (Signed)
 Pt concerned because she still feels very poorly - back ache and headache especially. Covid test came back negative so she is not sure what to do now?

## 2019-03-29 ENCOUNTER — Telehealth: Admit: 2019-03-29 | Discharge: 2019-03-29 | Payer: PRIVATE HEALTH INSURANCE | Attending: Oncology | Primary: Oncology

## 2019-03-29 DIAGNOSIS — J069 Acute upper respiratory infection, unspecified: Secondary | ICD-10-CM

## 2019-03-29 MED ORDER — INSULIN PEN NEEDLE 32G X 6 MM MISC
2 refills | 34.00000 days | Status: DC
Start: 2019-03-29 — End: 2019-12-30

## 2019-03-29 NOTE — Progress Notes (Signed)
 03/29/2019    TELEHEALTH EVALUATION -- Audio/Visual (During COVID-19 public health emergency)    HPI:    Shelly Richard (DOB:  November 15, 1985) has requested an audio/video evaluation for the following concern(s):    HPI  Started with headache and body aches that started last Monday. She was tested for covid the next day and tested negative. She continues with body aches and headache since then. She has been taking tylenol  which has not been helping. She denies cough or SOB. No fevers. She is overall feeling about the same without improvement.     Review of Systems   Constitutional: Positive for fatigue. Negative for chills and fever.   HENT: Negative for congestion, sinus pressure and sinus pain.    Respiratory: Negative for cough, shortness of breath and wheezing.    Cardiovascular: Negative for chest pain and palpitations.   Gastrointestinal: Negative for abdominal pain, constipation and diarrhea.   Musculoskeletal: Positive for myalgias. Negative for arthralgias and back pain.   Skin: Negative for color change, pallor and rash.   Neurological: Positive for headaches. Negative for dizziness, syncope, weakness and light-headedness.   Psychiatric/Behavioral: Negative for behavioral problems, confusion and sleep disturbance. The patient is not nervous/anxious.        Prior to Visit Medications    Medication Sig Taking? Authorizing Provider   Insulin  Pen Needle 32G X 6 MM MISC Once daily Yes [name], [name]   cetirizine  (ZYRTEC ) 10 MG tablet TAKE ONE TABLET BY MOUTH DAILY  Syd Manges E Andres, [name]   levothyroxine  (SYNTHROID ) 50 MCG tablet TAKE ONE TABLET BY MOUTH DAILY  [name] Tindall E Andres, [name]   montelukast  (SINGULAIR ) 10 MG tablet TAKE ONE TABLET BY MOUTH DAILY  Adnan Vanvoorhis E Andres, [name]   albuterol  sulfate HFA 108 (90 Base) MCG/ACT inhaler Inhale 2 puffs into the lungs every 6 hours as needed for Wheezing or Shortness of Breath  [name], [name]   Insulin  Pen Needle ([employer] PEN NEEDLES 31G)  31G X 8 MM MISC 1 each by Does not apply route daily  [name], [name]   XULTOPHY  100-3.6 UNIT-MG/ML SOPN INJECT 16 UNITS  DAILY AS DIRECTED  Rocio G Tussey, MD   metFORMIN  (GLUCOPHAGE ) 500 MG tablet TAKE TWO TABLETS BY MOUTH TWICE A DAY WITH MEALS  Rocio G Tussey, MD   ferrous sulfate  (IRON  325) 325 (65 Fe) MG tablet Take 1 tablet by mouth daily (with breakfast)  Godfrey Tritschler E Andres, [name]   simvastatin  (ZOCOR ) 40 MG tablet TAKE ONE TABLET BY MOUTH DAILY  [name], [name]   Cholecalciferol (VITAMIN D3) 50 MCG (2000 UT) TABS TAKE 1 TABLET BY MOUTH DAILY  Carlyn Mullenbach E Andres, [name]   Insulin  Pen Needle ([employer] PEN NEEDLES 29G) 29G X MISC 1 each by Does not apply route daily  Adaleena Mooers E Andres, [name]   valACYclovir  (VALTREX ) 500 MG tablet TAKE 1 TABLET BY MOUTH DAILY  Patient not taking: Reported on 02/10/2019  C Francis Scurry, MD   Lancets MISC 1 each by Does not apply route daily Please provide lancets approved by insurance.  Rocio G Tussey, MD   Blood Glucose Monitoring Suppl [facility] BLOOD GLUCOSE METER) w/Device KIT Please provide meter approved by insurance.  Check bs qam  Rocio G Tussey, MD   blood glucose monitor strips Tes2t qam  times a day & as needed for symptoms of  irregular blood glucose. Please provide test strips approved by insurance.  Rocio G Tussey, MD   Lactobacillus (ACIDOPHILUS) TABS TAKE 1 TABLET BY MOUTH EVERY DAY  Patient not taking: Reported on 02/10/2019  C Francis Scurry, MD   losartan  (COZAAR ) 25 MG tablet TAKE 1 TABLET BY MOUTH EVERY DAY  C Francis Scurry, MD   ibuprofen  (ADVIL ;MOTRIN ) 600 MG tablet Take 1 tablet by mouth every 6 hours as needed for Pain  C Francis Scurry, MD       Social History     Tobacco Use    Smoking status: Never Smoker    Smokeless tobacco: Never Used   Substance Use Topics    Alcohol use: No    Drug use: No        Past Medical History:   Diagnosis Date    Allergies     Asthma     Diabetes mellitus (HCC)     Genital herpes     HTN  (hypertension)     Hyperlipidemia     Hypothyroid     Urticaria    ,   Past Surgical History:   Procedure Laterality Date    CYST REMOVAL         PHYSICAL EXAMINATION:  Physical Exam  Constitutional:       Appearance: Normal appearance.   HENT:      Head: Normocephalic and atraumatic.      Mouth/Throat:      Mouth: Mucous membranes are moist.   Eyes:      Extraocular Movements: Extraocular movements intact.      Conjunctiva/sclera: Conjunctivae normal.   Neck:      Musculoskeletal: Normal range of motion.   Pulmonary:      Effort: Pulmonary effort is normal.   Skin:     Coloration: Skin is not jaundiced or pale.   Neurological:      General: No focal deficit present.      Mental Status: She is alert and oriented to person, place, and time.   Psychiatric:         Mood and Affect: Mood normal.         Behavior: Behavior normal.         Thought Content: Thought content normal.         ASSESSMENT/PLAN:  Viral URI  Body aches and chills   No cough or SOB   Covid negative but may have tested too early    Would advise staying home until symptoms resolve   Letter provided for work   Suggested tylenol , cough suppressants prn, increase fluids    Call if symptoms worsen or fail to improve          No follow-ups on file.    Shelly Richard is a 33 y.o. female being evaluated by a Virtual Visit (video visit) encounter to address concerns as mentioned above.  A caregiver was present when appropriate. Due to this being a Scientist, Research (medical) (During COVID-19 public health emergency), evaluation of the following organ systems was limited: Vitals/Constitutional/EENT/Resp/CV/GI/GU/MS/Neuro/Skin/Heme-Lymph-Imm.  Pursuant to the emergency declaration under the [facility] Act and the [employer], 1135 waiver authority and the [employer] Act, this Virtual Visit was conducted with patient's (and/or legal guardian's) consent, to reduce the patient's risk of exposure to  COVID-19 and provide necessary medical care.  The patient (and/or legal guardian) has also been advised to contact this office for worsening conditions or problems, and seek emergency medical treatment  and/or call 911 if deemed necessary.     Patient identification was verified at the start of the visit: Yes    Total time spent on this encounter: Not billed by time    Services were provided through a video synchronous discussion virtually to substitute for in-person clinic visit. Patient and provider were located at their individual homes.    --[name], [name] on 04/01/2019 at 9:30 AM    An electronic signature was used to authenticate this note.

## 2019-03-31 MED ORDER — LEVOTHYROXINE SODIUM 50 MCG PO TABS
50 | ORAL_TABLET | ORAL | 0 refills | Status: DC
Start: 2019-03-31 — End: 2019-06-14

## 2019-03-31 MED ORDER — CETIRIZINE HCL 10 MG PO TABS
10 | ORAL_TABLET | ORAL | 1 refills | 30.00000 days | Status: DC
Start: 2019-03-31 — End: 2021-11-30

## 2019-03-31 NOTE — Telephone Encounter (Signed)
 Pt still having headache and body aches   No fever or chills  She tested negative for COVID  What can she do?

## 2019-03-31 NOTE — Telephone Encounter (Signed)
 From: [name]  To: [name], [name]  Sent: 03/31/2019 4:27 PM EST  Subject: Non-Urgent Medical Question    Shelly Richard Bernice,   I am still not feeling like myself but still have the body aches and headaches. I don't know what else to do.

## 2019-04-01 ENCOUNTER — Encounter

## 2019-04-01 DIAGNOSIS — J069 Acute upper respiratory infection, unspecified: Secondary | ICD-10-CM

## 2019-04-01 MED ORDER — RIZATRIPTAN BENZOATE 10 MG PO TABS
10 | ORAL_TABLET | Freq: Once | ORAL | 3 refills | 30.00000 days | Status: DC | PRN
Start: 2019-04-01 — End: 2022-01-22

## 2019-04-01 NOTE — Assessment & Plan Note (Signed)
 Body aches and chills   No cough or SOB   Covid negative but may have tested too early    Would advise staying home until symptoms resolve   Letter provided for work   Suggested tylenol , cough suppressants prn, increase fluids    Call if symptoms worsen or fail to improve

## 2019-04-01 NOTE — Telephone Encounter (Signed)
 Responded via mychart

## 2019-04-01 NOTE — Telephone Encounter (Signed)
 Spoke to patient. Check BMP. Start maxalt  for headache.

## 2019-04-02 ENCOUNTER — Encounter

## 2019-04-03 LAB — BASIC METABOLIC PANEL
Anion Gap: 22 — ABNORMAL HIGH (ref 3–16)
BUN: 14 mg/dL (ref 7–20)
CO2: 16 mmol/L — ABNORMAL LOW (ref 21–32)
Calcium: 10.2 mg/dL (ref 8.3–10.6)
Chloride: 103 mmol/L (ref 99–110)
Creatinine: 0.5 mg/dL — ABNORMAL LOW (ref 0.6–1.1)
GFR African American: >60 (ref >60)
GFR Non-African American: >60 (ref >60)
Glucose: 333 mg/dL — ABNORMAL HIGH (ref 70–99)
Potassium: 4.6 mmol/L (ref 3.5–5.1)
Sodium: 141 mmol/L (ref 136–145)

## 2019-04-03 LAB — CBC
Hematocrit: 43.6 % (ref 36.0–48.0)
Hemoglobin: 14.3 g/dL (ref 12.0–16.0)
MCH: 28.3 pg (ref 26.0–34.0)
MCHC: 32.8 g/dL (ref 31.0–36.0)
MCV: 86.1 fL (ref 80.0–100.0)
MPV: 9.7 fL (ref 5.0–10.5)
Platelets: 254 10*3/uL (ref 135–450)
RBC: 5.06 M/uL (ref 4.00–5.20)
RDW: 14.9 % (ref 12.4–15.4)
WBC: 8.6 10*3/uL (ref 4.0–11.0)

## 2019-04-14 NOTE — Telephone Encounter (Signed)
Error

## 2019-04-21 NOTE — Telephone Encounter (Signed)
 From: [name]  To: [name], [name]  Sent: 04/21/2019 6:05 AM EST  Subject: Non-Urgent Medical Question    Good morning Bernice,    I am sick again this time it is fever and chills and no appetite. I was fine yesterday until I got off of work I slept from 2 to 8...[id number] Please help me

## 2019-04-21 NOTE — Telephone Encounter (Signed)
 Patient called office. Instructed to be tested for Covid

## 2019-04-22 ENCOUNTER — Ambulatory Visit: Admit: 2019-04-22 | Discharge: 2019-04-22 | Payer: PRIVATE HEALTH INSURANCE | Attending: Family | Primary: Oncology

## 2019-04-22 DIAGNOSIS — Z20822 Contact with and (suspected) exposure to covid-19: Secondary | ICD-10-CM

## 2019-04-22 NOTE — Progress Notes (Signed)
 Shelly Richard received a viral test for COVID-19. They were educated on isolation and quarantine as appropriate. For any symptoms, they were directed to seek care from their PCP, given contact information to establish with a doctor, directed to an urgent care or the emergency room.

## 2019-04-23 LAB — COVID-19: SARS-CoV-2, NAA: DETECTED — AB

## 2019-04-23 NOTE — Other (Signed)
Mailbox full. Pt has MyChart**    Your test came back detected/positive for Covid-19.  What happens if I have a positive test?  If you have symptoms:  Isolate until all three of these things are true: 1) your symptoms are better, 2) it has been 10 days since you first felt sick, and 3) you have had no fever for at least 24 hours without using medicine that lowers fever.  Drink plenty of fluids and eat when you can. You may take medicine for pain or fever if you need to. Rest as much as you can.    If you do not have symptoms:  Stay home for 10 days after the date you were tested.  If you develop symptoms during those 10 days, stay home until all three of these things are true: 1) your symptoms are better, 2) it has been 10 days since you first felt sick, and 3) you have had no fever for at least 24 hours without using medicine that lowers fever.      Follow care instructions from your doctor or other healthcare provider.   Seek emergency medical care immediately if you have trouble breathing, persistent pain or pressure in the chest, new confusion, inability to wake or stay awake, or bluish lips or face.     Someone from the health department (case investigator or contact tracer) may reach out to you to check on your health and ask about other people you have been around or where you've spent time while you may have been able to spread COVID-19 to others. This person's role is strictly to map the virus to help identify people who may have been exposed to the virus and prevent its spread. The local health department will also provide guidance on how to stay safely at home to avoid spreading illness.    Detected results can happen for months and you are not considered contagious.  No retest is necessary.

## 2019-04-23 NOTE — Telephone Encounter (Signed)
 From: [name]  To: [name], [name]  Sent: 04/23/2019 12:55 PM EST  Subject: Test Results Question    Since I am positive what am I suppose to do now?

## 2019-04-26 MED ORDER — ALBUTEROL SULFATE HFA 108 (90 BASE) MCG/ACT IN AERS
108 | Freq: Four times a day (QID) | RESPIRATORY_TRACT | 5 refills | 25.00000 days | Status: DC | PRN
Start: 2019-04-26 — End: 2024-04-20

## 2019-04-29 NOTE — Telephone Encounter (Signed)
 From: Shelly Richard  To: [name], [name]  Sent: 04/28/2019 5:16 PM EST  Subject: Test Results Question    Can I return to work if I still have this cough?      ----- Message -----   From:Erika FORBES Merck, [name]   Sent:04/26/2019 8:21 AM EST   [name]   Subject:RE: Test Results Question    Yes, I just sent this in for you. No problem.       ----- Message -----   From:Shelly Richard   Sent:04/23/2019 8:04 PM EST   [name], [name]   Subject:RE: Test Results Question    Are you able to prescribe another inhaler? I don't remember the last time I had one      ----- Message -----   From:Erika FORBES Merck, [name]   Sent:04/23/2019 2:37 PM EST   [name]   Subject:RE: Test Results Question    Sorry to hear you are feeling bad. I would recommend Tylenol  for body aches, headaches, and fevers, over the counter cough suppressants for cough, and lots of rest and fluids. Let us  know if you are feeling worse or have any concern.       ----- Message -----   From:[name]   Sent:04/23/2019 12:55 PM EST   [name], [name]   Subject:Test Results Question    Since I am positive what am I suppose to do now?

## 2019-04-30 NOTE — Telephone Encounter (Signed)
 Called patient for update on condition. No answer. Will try again.

## 2019-05-18 MED ORDER — VITAMIN D3 50 MCG (2000 UT) PO TABS
50 | ORAL_TABLET | ORAL | 0 refills | 30.00000 days | Status: DC
Start: 2019-05-18 — End: 2019-07-12

## 2019-06-14 MED ORDER — LEVOTHYROXINE SODIUM 50 MCG PO TABS
50 | ORAL_TABLET | ORAL | 0 refills | Status: DC
Start: 2019-06-14 — End: 2019-07-30

## 2019-06-14 NOTE — Telephone Encounter (Signed)
Last Refill 03/31/19

## 2019-06-23 NOTE — Telephone Encounter (Signed)
 Patient requesting a medication refill.  Medication XULTOPHY    Dosage 100-3.6 UNIT-MG/ML SOPN   Frequency: INJECT 16 UNITS DAILY AS DIRECTED  Last [facility] on 01/04/19  [facility] [address], [place] - [address] [name] [phone] - F [phone]

## 2019-06-23 NOTE — Telephone Encounter (Signed)
 LOV: 03/29/19  No FOV

## 2019-06-24 MED ORDER — XULTOPHY 100-3.6 UNIT-MG/ML SC SOPN
100-3.6 | PEN_INJECTOR | SUBCUTANEOUS | 3 refills | Status: DC
Start: 2019-06-24 — End: 2019-08-11

## 2019-07-12 MED ORDER — VITAMIN D3 50 MCG (2000 UT) PO TABS
50 | ORAL_TABLET | ORAL | 2 refills | 30.00000 days | Status: DC
Start: 2019-07-12 — End: 2020-06-06

## 2019-07-22 ENCOUNTER — Encounter

## 2019-07-22 MED ORDER — MONTELUKAST SODIUM 10 MG PO TABS
10 | ORAL_TABLET | ORAL | 1 refills | 90.00000 days | Status: DC
Start: 2019-07-22 — End: 2019-12-21

## 2019-07-30 MED ORDER — LEVOTHYROXINE SODIUM 50 MCG PO TABS
50 | ORAL_TABLET | ORAL | 0 refills | Status: DC
Start: 2019-07-30 — End: 2019-09-06

## 2019-08-11 ENCOUNTER — Ambulatory Visit: Admit: 2019-08-11 | Discharge: 2019-08-11 | Payer: PRIVATE HEALTH INSURANCE | Attending: Oncology | Primary: Oncology

## 2019-08-11 ENCOUNTER — Encounter

## 2019-08-11 ENCOUNTER — Encounter: Payer: PRIVATE HEALTH INSURANCE | Attending: Oncology | Primary: Oncology

## 2019-08-11 DIAGNOSIS — E119 Type 2 diabetes mellitus without complications: Secondary | ICD-10-CM

## 2019-08-11 LAB — POCT GLYCOSYLATED HEMOGLOBIN (HGB A1C): Hemoglobin A1C: 9.7 %

## 2019-08-11 MED ORDER — XULTOPHY 100-3.6 UNIT-MG/ML SC SOPN
100-3.6 | PEN_INJECTOR | SUBCUTANEOUS | 3 refills | Status: DC
Start: 2019-08-11 — End: 2019-12-23

## 2019-08-11 MED ORDER — DAPAGLIFLOZIN PROPANEDIOL 10 MG PO TABS
10 | ORAL_TABLET | Freq: Every morning | ORAL | 5 refills | Status: DC
Start: 2019-08-11 — End: 2020-01-12

## 2019-08-11 NOTE — Assessment & Plan Note (Signed)
 A1c 9.7  Add Farxiga   Increase Xultophy  to 18 units, if blood sugars remain above 200 after 1 week increase to 20 units  Discussed importance of monitoring blood sugars at home  Discussed diet and exercise  Follow-up in 4 weeks

## 2019-08-11 NOTE — Assessment & Plan Note (Signed)
Check TSH.  Continue Synthroid. 

## 2019-08-11 NOTE — Assessment & Plan Note (Signed)
 Check CBC  Continue iron  supplement

## 2019-08-11 NOTE — Assessment & Plan Note (Signed)
Stable, controlled. Continue regimen.  

## 2019-08-11 NOTE — Assessment & Plan Note (Signed)
Check lipids Continue simvastatin

## 2019-08-11 NOTE — Assessment & Plan Note (Signed)
 Stable, controlled  Continue inhalers  Continue Singulair 

## 2019-08-11 NOTE — Progress Notes (Signed)
 Shelly Richard (DOB:  02/01/1986) is a 34 y.o. female,Established patient, here for evaluation of the following chief complaint(s):  Diabetes      ASSESSMENT/PLAN:  1. Type 2 diabetes mellitus without complication, without long-term current use of insulin  (HCC)  Assessment & Plan:  A1c 9.7  Add Farxiga   Increase Xultophy  to 18 units, if blood sugars remain above 200 after 1 week increase to 20 units  Discussed importance of monitoring blood sugars at home  Discussed diet and exercise  Follow-up in 4 weeks  Orders:  -     POCT glycosylated hemoglobin (Hb A1C)  -     MICROALBUMIN / CREATININE URINE RATIO; Future  -     HM DIABETES FOOT EXAM  2. Mixed hyperlipidemia  Assessment & Plan:  Check lipids  Continue simvastatin   Orders:  -     Lipid, Fasting; Future  3. Essential hypertension  Assessment & Plan:  Stable, controlled  Continue regimen  Orders:  -     CBC; Future  -     Comprehensive Metabolic Panel; Future  4. Acquired hypothyroidism  Assessment & Plan:  Check TSH  Continue Synthroid   Orders:  -     TSH with Reflex; Future  5. Vitamin D deficiency  -     Vitamin D 25 Hydroxy; Future  6. Mild intermittent asthma without complication  Assessment & Plan:  Stable, controlled  Continue inhalers  Continue Singulair   7. Routine general medical examination at a health care facility  -     HEPATITIS C ANTIBODY; Future  8. Iron  deficiency anemia, unspecified iron  deficiency anemia type  Assessment & Plan:  Check CBC  Continue iron  supplement      No follow-ups on file.    SUBJECTIVE/OBJECTIVE:  HPI  Here for follow-up of her diabetes.  She has not been checking her blood sugars at home.  States that she has not been following a very healthy diet.  She has been stressed and admits to overeating.  She has been compliant with her insulin .  Taking 16 units at night.  She has felt fatigued lately.  She also reports heat intolerance.  Struggling with weight gain.      Current Outpatient Medications   Medication Sig Dispense  Refill    dapagliflozin  (FARXIGA ) 10 MG tablet Take 1 tablet by mouth every morning 30 tablet 5    Insulin  Degludec-Liraglutide  (XULTOPHY ) 100-3.6 UNIT-MG/ML SOPN INJECT 18 UNITS  DAILY AS DIRECTED 3 pen 3    levothyroxine  (SYNTHROID ) 50 MCG tablet TAKE ONE TABLET BY MOUTH DAILY 30 tablet 0    montelukast  (SINGULAIR ) 10 MG tablet TAKE ONE TABLET BY MOUTH DAILY 30 tablet 1    Cholecalciferol (VITAMIN D3) 50 MCG (2000 UT) TABS TAKE ONE TABLET BY MOUTH DAILY 30 tablet 2    albuterol  sulfate HFA 108 (90 Base) MCG/ACT inhaler Inhale 2 puffs into the lungs every 6 hours as needed for Wheezing or Shortness of Breath 1 Inhaler 5    cetirizine  (ZYRTEC ) 10 MG tablet TAKE ONE TABLET BY MOUTH DAILY 30 tablet 1    Insulin  Pen Needle 32G X 6 MM MISC Once daily 90 each 2    Insulin  Pen Needle ([employer] PEN NEEDLES 31G) 31G X 8 MM MISC 1 each by Does not apply route daily 100 each 3    metFORMIN  (GLUCOPHAGE ) 500 MG tablet TAKE TWO TABLETS BY MOUTH TWICE A DAY WITH MEALS 270 tablet 0    ferrous sulfate  (IRON  325) 325 (65  Fe) MG tablet Take 1 tablet by mouth daily (with breakfast) 30 tablet 5    simvastatin  (ZOCOR ) 40 MG tablet TAKE ONE TABLET BY MOUTH DAILY 30 tablet 3    Insulin  Pen Needle ([employer] PEN NEEDLES 29G) 29G X MISC 1 each by Does not apply route daily 100 each 3    Lancets MISC 1 each by Does not apply route daily Please provide lancets approved by insurance. 100 each 3    Blood Glucose Monitoring Suppl ([facility] BLOOD GLUCOSE METER) w/Device KIT Please provide meter approved by insurance.  Check bs qam 1 kit 0    blood glucose monitor strips Tes2t qam  times a day & as needed for symptoms of irregular blood glucose. Please provide test strips approved by insurance. 100 strip 2    Lactobacillus (ACIDOPHILUS) TABS TAKE 1 TABLET BY MOUTH EVERY DAY 30 tablet 5    losartan  (COZAAR ) 25 MG tablet TAKE 1 TABLET BY MOUTH EVERY DAY 30 tablet 5    ibuprofen  (ADVIL ;MOTRIN ) 600 MG tablet Take 1 tablet by mouth  every 6 hours as needed for Pain 30 tablet 2    rizatriptan  (MAXALT ) 10 MG tablet Take 1 tablet by mouth once as needed for Migraine May repeat in 2 hours if needed 30 tablet 3    valACYclovir  (VALTREX ) 500 MG tablet TAKE 1 TABLET BY MOUTH DAILY (Patient not taking: Reported on 02/10/2019) 90 tablet 5     No current facility-administered medications for this visit.        Review of Systems   Constitutional: Positive for fatigue and unexpected weight change ( Weight gain). Negative for chills and fever.   HENT: Negative for congestion, sinus pressure and sinus pain.    Respiratory: Negative for cough, shortness of breath and wheezing.    Cardiovascular: Negative for chest pain and palpitations.   Gastrointestinal: Negative for abdominal pain, constipation and diarrhea.   Endocrine: Positive for heat intolerance.   Musculoskeletal: Negative for arthralgias, back pain and myalgias.   Skin: Negative for color change, pallor and rash.   Neurological: Negative for dizziness, syncope, weakness, light-headedness and headaches.   Psychiatric/Behavioral: Negative for behavioral problems, confusion and sleep disturbance. The patient is not nervous/anxious.        Vitals:    08/11/19 1125   BP: 107/72   Site: Left Upper Arm   Position: Sitting   Cuff Size: Large Adult   Pulse: 93   SpO2: 96%   Weight: 213 lb (96.6 kg)       Physical Exam  Constitutional:       Appearance: She is well-developed.   HENT:      Head: Normocephalic and atraumatic.   Eyes:      Conjunctiva/sclera: Conjunctivae normal.      Pupils: Pupils are equal, round, and reactive to light.   Neck:      Musculoskeletal: Normal range of motion and neck supple.      Thyroid: No thyromegaly.      Vascular: No JVD.   Cardiovascular:      Rate and Rhythm: Normal rate and regular rhythm.      Heart sounds: Normal heart sounds.   Pulmonary:      Effort: Pulmonary effort is normal. No respiratory distress.      Breath sounds: Normal breath sounds. No wheezing.    Musculoskeletal: Normal range of motion.         General: No deformity.   Skin:     General: Skin  is warm and dry.      Capillary Refill: Capillary refill takes less than 2 seconds.   Neurological:      Mental Status: She is alert and oriented to person, place, and time.   Psychiatric:         Behavior: Behavior normal.         An electronic signature was used to authenticate this note.    --[name], [name]

## 2019-08-12 LAB — ALBUMIN/CREATININE RATIO, URINE
Albumin/Creatinine Ratio: 59.5 mg/g — ABNORMAL HIGH (ref 0.0–30.0)
Creatinine, Ur: 139.5 mg/dL (ref 28.0–259.0)
Microalb, Ur: 8.3 mg/dL — ABNORMAL HIGH (ref <2.0)

## 2019-08-12 LAB — LIPID, FASTING
Cholesterol, Fasting: 172 mg/dL (ref 0–199)
HDL: 27 mg/dL — ABNORMAL LOW (ref 40–60)
Triglyceride, Fasting: 354 mg/dL — ABNORMAL HIGH (ref 0–150)

## 2019-08-12 LAB — CBC
Hematocrit: 37.2 % (ref 36.0–48.0)
Hemoglobin: 12.5 g/dL (ref 12.0–16.0)
MCH: 29.2 pg (ref 26.0–34.0)
MCHC: 33.5 g/dL (ref 31.0–36.0)
MCV: 87.1 fL (ref 80.0–100.0)
MPV: 9.4 fL (ref 5.0–10.5)
Platelets: 261 10*3/uL (ref 135–450)
RBC: 4.27 M/uL (ref 4.00–5.20)
RDW: 15.3 % (ref 12.4–15.4)
WBC: 8.2 10*3/uL (ref 4.0–11.0)

## 2019-08-12 LAB — COMPREHENSIVE METABOLIC PANEL
ALT: 34 U/L (ref 10–40)
AST: 53 U/L — ABNORMAL HIGH (ref 15–37)
Albumin/Globulin Ratio: 1.4 (ref 1.1–2.2)
Albumin: 4.1 g/dL (ref 3.4–5.0)
Alkaline Phosphatase: 109 U/L (ref 40–129)
Anion Gap: 14 (ref 3–16)
BUN: 7 mg/dL (ref 7–20)
CO2: 23 mmol/L (ref 21–32)
Calcium: 9.1 mg/dL (ref 8.3–10.6)
Chloride: 99 mmol/L (ref 99–110)
Creatinine: 0.6 mg/dL (ref 0.6–1.1)
GFR African American: >60 (ref >60)
GFR Non-African American: >60 (ref >60)
Globulin: 2.9 g/dL
Glucose: 322 mg/dL — ABNORMAL HIGH (ref 70–99)
Potassium: 4.4 mmol/L (ref 3.5–5.1)
Sodium: 136 mmol/L (ref 136–145)
Total Bilirubin: 0.8 mg/dL (ref 0.0–1.0)
Total Protein: 7 g/dL (ref 6.4–8.2)

## 2019-08-12 LAB — TSH REFLEX TO FT4: TSH: 2.52 u[IU]/mL (ref 0.27–4.20)

## 2019-08-12 LAB — HEPATITIS C ANTIBODY: Hep C Ab Interp: NONREACTIVE

## 2019-08-12 LAB — VITAMIN D 25 HYDROXY: Vit D, 25-Hydroxy: 26.7 ng/mL — ABNORMAL LOW (ref >=30)

## 2019-08-12 LAB — LDL CHOLESTEROL, DIRECT: LDL Direct: 99 mg/dL (ref <100)

## 2019-09-06 MED ORDER — METFORMIN HCL 500 MG PO TABS
500 | ORAL_TABLET | ORAL | 0 refills | Status: DC
Start: 2019-09-06 — End: 2020-06-06

## 2019-09-06 MED ORDER — LEVOTHYROXINE SODIUM 50 MCG PO TABS
50 | ORAL_TABLET | ORAL | 0 refills | Status: DC
Start: 2019-09-06 — End: 2019-12-21

## 2019-09-14 MED ORDER — FLUCONAZOLE 150 MG PO TABS
150 | ORAL_TABLET | Freq: Once | ORAL | 0 refills | 5.00000 days | Status: AC
Start: 2019-09-14 — End: 2019-09-14

## 2019-09-14 NOTE — Telephone Encounter (Signed)
 Diflucan  sent to pharmacy.  If symptoms do not improve please schedule appointment

## 2019-09-14 NOTE — Telephone Encounter (Signed)
 Pt asking for a medication for a yeast infection  She has mostly itchiness  She was told one her medications could cause a yeast infection

## 2019-09-15 ENCOUNTER — Encounter: Attending: Oncology | Primary: Oncology

## 2019-12-19 ENCOUNTER — Encounter

## 2019-12-21 MED ORDER — SIMVASTATIN 40 MG PO TABS
40 | ORAL_TABLET | ORAL | 3 refills | Status: DC
Start: 2019-12-21 — End: 2021-06-07

## 2019-12-21 MED ORDER — LEVOTHYROXINE SODIUM 50 MCG PO TABS
50 | ORAL_TABLET | ORAL | 0 refills | Status: DC
Start: 2019-12-21 — End: 2020-06-06

## 2019-12-21 MED ORDER — MONTELUKAST SODIUM 10 MG PO TABS
10 | ORAL_TABLET | ORAL | 1 refills | 90.00000 days | Status: DC
Start: 2019-12-21 — End: 2022-05-22

## 2019-12-21 NOTE — Telephone Encounter (Signed)
 From: [name]  To: [name], [name]  Sent: 12/21/2019 10:07 AM EDT  Subject: Non-Urgent Medical Question    Hello Bernice,    I want to know if I am supposed still be taking Losartan  because I dont have any more I just now noticed that was the medicine that was missing. I also had a few questions about vitamins. I stop taking the one medicine because I believe I still have a yeast infection. So I need 2 Fluconazole  to clear it up

## 2019-12-23 MED ORDER — XULTOPHY 100-3.6 UNIT-MG/ML SC SOPN
100-3.6 | PEN_INJECTOR | SUBCUTANEOUS | 3 refills | Status: DC
Start: 2019-12-23 — End: 2020-06-06

## 2019-12-28 MED ORDER — LANTUS SOLOSTAR 100 UNIT/ML SC SOPN
100 | PEN_INJECTOR | Freq: Every evening | SUBCUTANEOUS | 0 refills | 44.00000 days | Status: DC
Start: 2019-12-28 — End: 2020-06-06

## 2019-12-28 MED ORDER — LOSARTAN POTASSIUM 25 MG PO TABS
25 | ORAL_TABLET | ORAL | 5 refills | Status: DC
Start: 2019-12-28 — End: 2021-05-21

## 2019-12-28 MED ORDER — VICTOZA 18 MG/3ML SC SOPN
18 | PEN_INJECTOR | Freq: Every day | SUBCUTANEOUS | 3 refills | 30.00000 days | Status: DC
Start: 2019-12-28 — End: 2021-05-03

## 2019-12-28 NOTE — Telephone Encounter (Signed)
 Reason for Disposition   Blood glucose 240 - 300 mg/dL (13.3 - 16.7 mmol/L)    Answer Assessment - Initial Assessment Questions  1. BLOOD GLUCOSE: What is your blood glucose level?       Checked Blood sugar 12:15 pm- 260- before eating lunch    2. ONSET: When did you check the blood glucose?     12:15 pm    3. USUAL RANGE: What is your glucose level usually? (e.g., usual fasting morning value, usual evening value)      Barely ate last night- only ate a hamburger and bun and salad- Just getting back on medication- last three weeks    4. KETONES: Do you check for ketones (urine or blood test strips)? If yes, ask: What does the test show now?      N/A    5. TYPE 1 or 2:  Do you know what type of diabetes you have?  (e.g., Type 1, Type 2, Gestational; doesn't know)      Type 2 diabetes    6. INSULIN : Do you take insulin ? What type of insulin (s) do you use? What is the mode of delivery? (syringe, pen; injection or pump)?       Takes an injection- does not use insulin  daily    7. DIABETES PILLS: Do you take any pills for your diabetes? If yes, ask: Have you missed taking any pills recently?      Metformin - 2 QAM and 2 QPAM    8. OTHER SYMPTOMS: Do you have any symptoms? (e.g., fever, frequent urination, difficulty breathing, dizziness, weakness, vomiting)     Denies    9. PREGNANCY: Is there any chance you are pregnant? When was your last menstrual period?      12/03/19    Protocols used: DIABETES - HIGH BLOOD SUGAR-ADULT-[place]    Received call from Shelly at [facility] with Red Flag Complaint.    Brief description of triage: See above.    Patient is unsure if medication is working.  Worried that blood sugar is not coming down in the last three weeks.  Provided education that blood sugar may take time to regulate.  Patient has not been taking blood sugar regularly.  Encouraged patient to monitor blood sugar so that a baseline is obtained.  Gave signs and symptoms of hyperglycemia.   Patient expressed understanding.    Triage indicates for patient to continue with home care.    Care advice provided, patient verbalizes understanding; denies any other questions or concerns; instructed to call back for any new or worsening symptoms.    Patient will call back with new or worsening symptoms.    Attention Provider:  Thank you for allowing me to participate in the care of your patient.  The patient was connected to triage in response to information provided to the [facility].  Please do not respond through this encounter as the response is not directed to a shared pool.

## 2019-12-28 NOTE — Telephone Encounter (Signed)
 Spoke to patient. Appointment scheduled for next week to follow up on DM

## 2019-12-28 NOTE — Telephone Encounter (Signed)
-----   Message from Mercer CHRISTELLA Gaskins sent at 12/28/2019 12:27 PM EDT -----  Subject: Message to Provider    QUESTIONS  Information for Provider? pt called with blood sugar of 270 last night .   she disconnected a call to nurse triage . please call patient.  ---------------------------------------------------------------------------  --------------  CALL BACK INFO  What is the best way for the office to contact you? OK to leave message on   voicemail  Preferred Call Back Phone Number? [phone]  ---------------------------------------------------------------------------  --------------  SCRIPT ANSWERS  Relationship to Patient? Self

## 2019-12-29 ENCOUNTER — Encounter

## 2019-12-29 MED ORDER — BLOOD GLUCOSE TEST VI STRP
ORAL_STRIP | 2 refills | 30.00000 days | Status: AC
Start: 2019-12-29 — End: ?

## 2019-12-29 NOTE — Telephone Encounter (Signed)
 error

## 2019-12-30 MED ORDER — INSULIN PEN NEEDLE 32G X 6 MM MISC
2 refills | 34.00000 days | Status: DC
Start: 2019-12-30 — End: 2021-06-11

## 2019-12-30 NOTE — Telephone Encounter (Signed)
 Received call from Selena at [facility] with Red Flag Complaint.    Brief description of triage: Pt reports having sore throat x2 days and left eye conjuntivitis this AM. Works at daycare.     Triage indicates for patient to have appt with PCP today or go to [facility] if no appts available.     Care advice provided, patient verbalizes understanding; denies any other questions or concerns; instructed to call back for any new or worsening symptoms.    Writer provided warm transfer to [place] at [facility] for appointment scheduling.    Attention Provider:  Thank you for allowing me to participate in the care of your patient.  The patient was connected to triage in response to information provided to the ECC.  Please do not respond through this encounter as the response is not directed to a shared pool.      Reason for Disposition   Sore throat is the main symptom and persists > 48 hours    Answer Assessment - Initial Assessment Questions  1. ONSET: When did the throat start hurting? (Hours or days ago)       12/28/19    2. SEVERITY: How bad is the sore throat? (Scale 1-10; mild, moderate or severe)    - MILD (1-3):  doesn't interfere with eating or normal activities    - MODERATE (4-7): interferes with eating some solids and normal activities    - SEVERE (8-10):  excruciating pain, interferes with most normal activities    - SEVERE DYSPHAGIA: can't swallow liquids, drooling     Mild    3. STREP EXPOSURE: Has there been any exposure to strep within the past week? If so, ask: What type of contact occurred?       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     4.  VIRAL SYMPTOMS: Are there any symptoms of a cold, such as a runny nose, cough, hoarse voice or red eyes?       Left eye conjunctivitis    5. FEVER: Do you have a fever? If so, ask: What is your temperature, how was it measured, and when did it start?      No fever    6. PUS ON THE TONSILS: Is there pus on the tonsils in the back of your  throat?      Unable to assess    7. OTHER SYMPTOMS: Do you have any other symptoms? (e.g., difficulty breathing, headache, rash)      Denies    8. PREGNANCY: Is there any chance you are pregnant? When was your last menstrual period?      LMP 12/03/19    Protocols used: SORE THROAT-ADULT-OH

## 2019-12-30 NOTE — Telephone Encounter (Signed)
 Script sent

## 2019-12-30 NOTE — Telephone Encounter (Signed)
 Pt asking for insulin  pin needles 32G x 4 MM  She said the pharmacy faxed some paperwork because they may need a PA

## 2019-12-31 ENCOUNTER — Telehealth: Admit: 2019-12-31 | Discharge: 2019-12-31 | Payer: PRIVATE HEALTH INSURANCE | Attending: Oncology | Primary: Oncology

## 2019-12-31 DIAGNOSIS — J0140 Acute pansinusitis, unspecified: Secondary | ICD-10-CM

## 2019-12-31 NOTE — Assessment & Plan Note (Signed)
 Continue doxycycline and Medrol  Dosepak per urgent care  Call for worsening symptoms  Monitor blood sugars while taking Medrol  Dosepak and prior to next visit

## 2019-12-31 NOTE — Progress Notes (Signed)
 Shelly Richard (DOB:  [birth year]) is a 34 y.o. female,Established patient, here for evaluation of the following chief complaint(s):  Pharyngitis      ASSESSMENT/PLAN:  1. Acute non-recurrent pansinusitis  Assessment & Plan:  Continue doxycycline and Medrol  Dosepak per urgent care  Call for worsening symptoms  Monitor blood sugars while taking Medrol  Dosepak and prior to next visit      No follow-ups on file.    SUBJECTIVE/OBJECTIVE:  HPI  Has been having URI symptoms for about a wee   Went to urgent care yesterday.  She was tested for strep and Covid with which both came back negative.  She was given doxycycline and Medrol  Dosepak for sinusitis.    She is feeling somewhat better today.  She is not taking anything over-the-counter.  She denies fever, cough, chest congestion.    Current Outpatient Medications   Medication Sig Dispense Refill    Insulin  Pen Needle 32G X 6 MM MISC Once daily 90 each 2    blood glucose monitor strips Tes2t qam  times a day & as needed for symptoms of irregular blood glucose. Please provide test strips approved by insurance. 100 strip 2    losartan  (COZAAR ) 25 MG tablet TAKE 1 TABLET BY MOUTH EVERY DAY 30 tablet 5    Liraglutide  (VICTOZA ) 18 MG/3ML SOPN SC injection Inject 1.2 mg into the skin daily 2 pen 3    insulin  glargine (LANTUS  SOLOSTAR) 100 UNIT/ML injection pen Inject 20 Units into the skin nightly 5 pen 0    Insulin  Degludec-Liraglutide  (XULTOPHY ) 100-3.6 UNIT-MG/ML SOPN injection pen INJECT 18 UNITS  DAILY AS DIRECTED 3 pen 3    levothyroxine  (SYNTHROID ) 50 MCG tablet TAKE ONE TABLET BY MOUTH DAILY 30 tablet 0    montelukast  (SINGULAIR ) 10 MG tablet TAKE ONE TABLET BY MOUTH DAILY 30 tablet 1    simvastatin  (ZOCOR ) 40 MG tablet TAKE ONE TABLET BY MOUTH DAILY 30 tablet 3    metFORMIN  (GLUCOPHAGE ) 500 MG tablet TAKE TWO TABLETS BY MOUTH TWICE A DAY WITH MEALS 270 tablet 0    dapagliflozin  (FARXIGA ) 10 MG tablet Take 1 tablet by mouth every morning 30 tablet 5     Cholecalciferol (VITAMIN D3) 50 MCG (2000 UT) TABS TAKE ONE TABLET BY MOUTH DAILY 30 tablet 2    albuterol  sulfate HFA 108 (90 Base) MCG/ACT inhaler Inhale 2 puffs into the lungs every 6 hours as needed for Wheezing or Shortness of Breath 1 Inhaler 5    rizatriptan  (MAXALT ) 10 MG tablet Take 1 tablet by mouth once as needed for Migraine May repeat in 2 hours if needed 30 tablet 3    cetirizine  (ZYRTEC ) 10 MG tablet TAKE ONE TABLET BY MOUTH DAILY 30 tablet 1    Insulin  Pen Needle ([employer] PEN NEEDLES 31G) 31G X 8 MM MISC 1 each by Does not apply route daily 100 each 3    ferrous sulfate  (IRON  325) 325 (65 Fe) MG tablet Take 1 tablet by mouth daily (with breakfast) 30 tablet 5    Insulin  Pen Needle ([employer] PEN NEEDLES 29G) 29G X MISC 1 each by Does not apply route daily 100 each 3    valACYclovir  (VALTREX ) 500 MG tablet TAKE 1 TABLET BY MOUTH DAILY (Patient not taking: Reported on 02/10/2019) 90 tablet 5    Lancets MISC 1 each by Does not apply route daily Please provide lancets approved by insurance. 100 each 3    Blood Glucose Monitoring Suppl (ACURA BLOOD GLUCOSE METER)  w/Device KIT Please provide meter approved by insurance.  Check bs qam 1 kit 0    Lactobacillus (ACIDOPHILUS) TABS TAKE 1 TABLET BY MOUTH EVERY DAY 30 tablet 5    ibuprofen  (ADVIL ;MOTRIN ) 600 MG tablet Take 1 tablet by mouth every 6 hours as needed for Pain 30 tablet 2     No current facility-administered medications for this visit.       Review of Systems   Constitutional: Negative for chills, fatigue and fever.   HENT: Positive for congestion, sinus pressure and sore throat. Negative for ear pain and sinus pain.    Respiratory: Negative for cough, shortness of breath and wheezing.    Cardiovascular: Negative for chest pain and palpitations.   Gastrointestinal: Negative for abdominal pain, constipation and diarrhea.   Musculoskeletal: Negative for arthralgias, back pain and myalgias.   Skin: Negative for color change, pallor and  rash.   Neurological: Negative for dizziness, syncope, weakness, light-headedness and headaches.   Psychiatric/Behavioral: Negative for behavioral problems, confusion and sleep disturbance. The patient is not nervous/anxious.        There were no vitals filed for this visit.    Physical Exam  Constitutional:       Appearance: Normal appearance.   HENT:      Head: Normocephalic and atraumatic.      Mouth/Throat:      Mouth: Mucous membranes are moist.   Eyes:      Extraocular Movements: Extraocular movements intact.      Conjunctiva/sclera: Conjunctivae normal.   Pulmonary:      Effort: Pulmonary effort is normal.   Musculoskeletal:      Cervical back: Normal range of motion.   Skin:     Coloration: Skin is not jaundiced or pale.   Neurological:      General: No focal deficit present.      Mental Status: She is alert and oriented to person, place, and time.   Psychiatric:         Mood and Affect: Mood normal.         Behavior: Behavior normal.         Thought Content: Thought content normal.             An electronic signature was used to authenticate this note.    --[name], [name]

## 2020-01-05 ENCOUNTER — Encounter: Attending: Oncology | Primary: Oncology

## 2020-01-05 NOTE — Telephone Encounter (Signed)
 [employer] calling to offer assistance on the Victoza  PA  It was submitted to the wrong insurance plan  Reference # [id number]

## 2020-01-06 NOTE — Telephone Encounter (Signed)
 This PA was not submitted by our dept; it was submitted by [facility] (prod) EHR Engine.  I don't know what that is or why it would have submitted the PA.    I submitted the PA for Victoza  18MG /3ML pen-injectors by fax to [facility].    STATUS: PENDING

## 2020-01-12 ENCOUNTER — Ambulatory Visit: Admit: 2020-01-12 | Discharge: 2020-01-12 | Payer: PRIVATE HEALTH INSURANCE | Attending: Oncology | Primary: Oncology

## 2020-01-12 DIAGNOSIS — E119 Type 2 diabetes mellitus without complications: Secondary | ICD-10-CM

## 2020-01-12 LAB — POCT GLYCOSYLATED HEMOGLOBIN (HGB A1C): Hemoglobin A1C: 9.6 %

## 2020-01-12 MED ORDER — DAPAGLIFLOZIN PROPANEDIOL 10 MG PO TABS
10 | ORAL_TABLET | Freq: Every morning | ORAL | 1 refills | Status: DC
Start: 2020-01-12 — End: 2021-05-21

## 2020-01-12 NOTE — Progress Notes (Signed)
 Shelly Richard (DOB:  1986/03/31) is a 34 y.o. female,Established patient, here for evaluation of the following chief complaint(s):  Diabetes      ASSESSMENT/PLAN:  1. Type 2 diabetes mellitus without complication, without long-term current use of insulin  (HCC)  Assessment & Plan:  A1c 9.6  Increase Lantus  to 22 units  Add Farxiga  back  Continue Victoza  and Metformin   Discussed diet changes  Encouraged increase in exercise regimen  Follow-up in 4 weeks  Orders:  -     POCT glycosylated hemoglobin (Hb A1C)      No follow-ups on file.    SUBJECTIVE/OBJECTIVE:  HPI  Patient is here for follow-up of her diabetes.  She has been taking Lantus  20 units nightly, Victoza , Metformin .  Did recently finish a Medrol  Dosepak for sinusitis.  Her sugars have been running in the 300s over the past few months.  She does report sugars in the 200s after adjusting medications.  She has been working on diet and exercise.  Snacking is an issue for her.  She is trying to cut out more red meat and stick to chicken, turkey, fish.  Feels she understands what food she needs to be eating.  She has met with dietitians in the past.  She has not been taking her Farxiga .    Current Outpatient Medications   Medication Sig Dispense Refill    dapagliflozin  (FARXIGA ) 10 MG tablet Take 1 tablet by mouth every morning 90 tablet 1    Insulin  Pen Needle 32G X 6 MM MISC Once daily 90 each 2    blood glucose monitor strips Tes2t qam  times a day & as needed for symptoms of irregular blood glucose. Please provide test strips approved by insurance. 100 strip 2    losartan  (COZAAR ) 25 MG tablet TAKE 1 TABLET BY MOUTH EVERY DAY 30 tablet 5    Liraglutide  (VICTOZA ) 18 MG/3ML SOPN SC injection Inject 1.2 mg into the skin daily 2 pen 3    insulin  glargine (LANTUS  SOLOSTAR) 100 UNIT/ML injection pen Inject 20 Units into the skin nightly 5 pen 0    Insulin  Degludec-Liraglutide  (XULTOPHY ) 100-3.6 UNIT-MG/ML SOPN injection pen INJECT 18 UNITS  DAILY AS  DIRECTED 3 pen 3    levothyroxine  (SYNTHROID ) 50 MCG tablet TAKE ONE TABLET BY MOUTH DAILY 30 tablet 0    montelukast  (SINGULAIR ) 10 MG tablet TAKE ONE TABLET BY MOUTH DAILY 30 tablet 1    simvastatin  (ZOCOR ) 40 MG tablet TAKE ONE TABLET BY MOUTH DAILY 30 tablet 3    metFORMIN  (GLUCOPHAGE ) 500 MG tablet TAKE TWO TABLETS BY MOUTH TWICE A DAY WITH MEALS 270 tablet 0    Cholecalciferol (VITAMIN D3) 50 MCG (2000 UT) TABS TAKE ONE TABLET BY MOUTH DAILY 30 tablet 2    albuterol  sulfate HFA 108 (90 Base) MCG/ACT inhaler Inhale 2 puffs into the lungs every 6 hours as needed for Wheezing or Shortness of Breath 1 Inhaler 5    cetirizine  (ZYRTEC ) 10 MG tablet TAKE ONE TABLET BY MOUTH DAILY 30 tablet 1    Insulin  Pen Needle ([employer]ER PEN NEEDLES 31G) 31G X 8 MM MISC 1 each by Does not apply route daily 100 each 3    ferrous sulfate  (IRON  325) 325 (65 Fe) MG tablet Take 1 tablet by mouth daily (with breakfast) 30 tablet 5    Insulin  Pen Needle ([employer] PEN NEEDLES 29G) 29G X MISC 1 each by Does not apply route daily 100 each 3    valACYclovir  (VALTREX )  500 MG tablet TAKE 1 TABLET BY MOUTH DAILY 90 tablet 5    Lancets MISC 1 each by Does not apply route daily Please provide lancets approved by insurance. 100 each 3    Blood Glucose Monitoring Suppl (ACURA BLOOD GLUCOSE METER) w/Device KIT Please provide meter approved by insurance.  Check bs qam 1 kit 0    Lactobacillus (ACIDOPHILUS) TABS TAKE 1 TABLET BY MOUTH EVERY DAY 30 tablet 5    ibuprofen  (ADVIL ;MOTRIN ) 600 MG tablet Take 1 tablet by mouth every 6 hours as needed for Pain 30 tablet 2    rizatriptan  (MAXALT ) 10 MG tablet Take 1 tablet by mouth once as needed for Migraine May repeat in 2 hours if needed 30 tablet 3     No current facility-administered medications for this visit.       Review of Systems   Constitutional: Negative for chills, fatigue and fever.   HENT: Negative for congestion, sinus pressure and sinus pain.    Respiratory: Negative for cough,  shortness of breath and wheezing.    Cardiovascular: Negative for chest pain and palpitations.   Gastrointestinal: Negative for abdominal pain, constipation and diarrhea.   Musculoskeletal: Negative for arthralgias, back pain and myalgias.   Skin: Negative for color change, pallor and rash.   Neurological: Negative for dizziness, syncope, weakness, light-headedness and headaches.   Psychiatric/Behavioral: Negative for behavioral problems, confusion and sleep disturbance. The patient is not nervous/anxious.        Vitals:    01/12/20 1433   BP: 116/68   Site: Left Upper Arm   Position: Sitting   Cuff Size: Large Adult   Pulse: 110   SpO2: 97%   Weight: 214 lb (97.1 kg)   Height: 5' 4 (1.626 m)       Physical Exam  Constitutional:       Appearance: She is well-developed.   HENT:      Head: Normocephalic and atraumatic.   Eyes:      Conjunctiva/sclera: Conjunctivae normal.      Pupils: Pupils are equal, round, and reactive to light.   Neck:      Thyroid: No thyromegaly.      Vascular: No JVD.   Cardiovascular:      Rate and Rhythm: Normal rate and regular rhythm.      Heart sounds: Normal heart sounds.   Pulmonary:      Effort: Pulmonary effort is normal. No respiratory distress.      Breath sounds: Normal breath sounds. No wheezing.   Abdominal:      General: Bowel sounds are normal.      Palpations: Abdomen is soft.   Musculoskeletal:         General: No deformity. Normal range of motion.      Cervical back: Normal range of motion and neck supple.   Skin:     General: Skin is warm and dry.      Capillary Refill: Capillary refill takes less than 2 seconds.   Neurological:      Mental Status: She is alert and oriented to person, place, and time.   Psychiatric:         Behavior: Behavior normal.         An electronic signature was used to authenticate this note.    --[name], [name]

## 2020-01-12 NOTE — Assessment & Plan Note (Signed)
 A1c 9.6  Increase Lantus  to 22 units  Add Farxiga  back  Continue Victoza  and Metformin   Discussed diet changes  Encouraged increase in exercise regimen  Follow-up in 4 weeks

## 2020-01-18 MED ORDER — AMOXICILLIN 875 MG PO TABS
875 | ORAL_TABLET | Freq: Two times a day (BID) | ORAL | 0 refills | 7.00000 days | Status: AC
Start: 2020-01-18 — End: 2020-01-25

## 2020-01-18 NOTE — Telephone Encounter (Signed)
 From: Shelly Richard  To: [name], [name]  Sent: 01/18/2020 7:45 AM EDT  Subject: Non-Urgent Medical Question    Good morning,    When I was in the office I was complaining about ear pain. Well the pain is worse and I think the right tonsil is swollen I'm not sure what is going on but meds are not helping

## 2020-01-24 MED ORDER — FLUCONAZOLE 150 MG PO TABS
150 | ORAL_TABLET | Freq: Once | ORAL | 0 refills | 5.00000 days | Status: AC
Start: 2020-01-24 — End: 2020-01-24

## 2020-01-24 NOTE — Telephone Encounter (Signed)
 Pt's dapagliflozin  (FARXIGA ) 10 MG tablet needs a PA

## 2020-01-24 NOTE — Telephone Encounter (Signed)
 Sent PA to PA Department.

## 2020-01-24 NOTE — Telephone Encounter (Signed)
 From: [name]  To: [name], [name]  Sent: 01/24/2020 11:54 AM EDT  Subject: Non-Urgent Medical Question    Hello Good Morning so I believe I either have a bv infection or a yeast infection from being on the antibiotics but I'm pretty sure it a bv infection. Do I need to come in or can you send something to the pharmacy

## 2020-01-25 NOTE — Telephone Encounter (Signed)
 PA for Farxiga  10MG  tablets submitted via [employer] to [facility].    STATUS: PENDING

## 2020-02-09 ENCOUNTER — Encounter: Attending: Oncology | Primary: Oncology

## 2020-03-20 ENCOUNTER — Encounter: Payer: PRIVATE HEALTH INSURANCE | Attending: Oncology | Primary: Oncology

## 2020-06-05 NOTE — Telephone Encounter (Signed)
Pt Is now scheduled.

## 2020-06-05 NOTE — Telephone Encounter (Signed)
Pt asking for a  letter for a companion animal  Her apartment is requesting it for her dog   The reason is depression after her sister in law was murdered and her husband left her

## 2020-06-06 ENCOUNTER — Ambulatory Visit: Admit: 2020-06-06 | Discharge: 2020-06-06 | Payer: PRIVATE HEALTH INSURANCE | Attending: Oncology | Primary: Oncology

## 2020-06-06 DIAGNOSIS — E119 Type 2 diabetes mellitus without complications: Secondary | ICD-10-CM

## 2020-06-06 LAB — POCT GLYCOSYLATED HEMOGLOBIN (HGB A1C): Hemoglobin A1C: 11.8 %

## 2020-06-06 MED ORDER — LANTUS SOLOSTAR 100 UNIT/ML SC SOPN
100 | PEN_INJECTOR | Freq: Every evening | SUBCUTANEOUS | 2 refills | 44.00000 days | Status: DC
Start: 2020-06-06 — End: 2021-05-03

## 2020-06-06 MED ORDER — LEVOTHYROXINE SODIUM 50 MCG PO TABS
50 | ORAL_TABLET | ORAL | 2 refills | Status: DC
Start: 2020-06-06 — End: 2021-05-03

## 2020-06-06 MED ORDER — TUBERCULIN PPD 5 UNIT/0.1ML ID SOLN
5 | Freq: Once | INTRADERMAL | Status: AC
Start: 2020-06-06 — End: 2020-06-07
  Administered 2020-06-06: 21:00:00 5 [IU] via INTRADERMAL

## 2020-06-06 MED ORDER — VITAMIN D3 50 MCG (2000 UT) PO TABS
50 | ORAL_TABLET | ORAL | 2 refills | 30.00000 days | Status: DC
Start: 2020-06-06 — End: 2021-06-26

## 2020-06-06 MED ORDER — FERROUS SULFATE 325 (65 FE) MG PO TABS
325 | ORAL_TABLET | Freq: Every day | ORAL | 2 refills | Status: DC
Start: 2020-06-06 — End: 2021-07-24

## 2020-06-06 MED ORDER — METFORMIN HCL 500 MG PO TABS
500 | ORAL_TABLET | ORAL | 1 refills | Status: DC
Start: 2020-06-06 — End: 2021-06-26

## 2020-06-06 NOTE — Assessment & Plan Note (Signed)
"   Well-controlled, continue current medications  "

## 2020-06-06 NOTE — Progress Notes (Signed)
 Shelly Richard (DOB:  April 20, 1985) is a 35 y.o. female,Established patient, here for evaluation of the following chief complaint(s):  Other (letter for companion animal, tb test)      ASSESSMENT/PLAN:  1. Type 2 diabetes mellitus without complication, without long-term current use of insulin  (HCC)  Assessment & Plan:  A1c 11.8%  Has been off medication   Restart lantus , metformin , victoza    Monitor BS at home   Discussed diet   Follow up in 2 months   Orders:  -     POCT glycosylated hemoglobin (Hb A1C)  2. Mild intermittent asthma without complication  Assessment & Plan:   Well-controlled, continue current medications    3. Tuberculosis screening  -     tuberculin injection 5 Units; 5 Units, IntraDERmal, Administer over 24 Hours, ONCE (PPD USE), On Tue 06/06/20 at 1645, For 1 dose  4. Essential hypertension  Assessment & Plan:  Stable, controlled  Continue current regimen  5. Acquired hypothyroidism  Assessment & Plan:  Stable, controlled   Continue levothyroxine    6. Dysphoric mood  Assessment & Plan:  Symptoms improving   Having a dog has helped greatly   She has good support from family   Call if symptoms worsen.      No follow-ups on file.    SUBJECTIVE/OBJECTIVE:  HPI   Patient is here for follow up.     She lost insurance for about a month and was out of all of her medication for about 8 weeks. She recently started them again about 2 weeks ago. She has not been checking her sugars regularly. She anticipates her A1c will be high. She was previously doing well on her current regimen of Lantus , Farxiga , Victoza .     She has been struggling lately with depression symptoms. She lost her sister in law last year and her husband left a few months ago and has not been home since. They are still in contact. He recently got her and her children a puppy to help with her mood. She has found this very beneficial.     Current Outpatient Medications   Medication Sig Dispense Refill    levothyroxine  (SYNTHROID ) 50 MCG tablet  TAKE ONE TABLET BY MOUTH DAILY 30 tablet 2    insulin  glargine (LANTUS  SOLOSTAR) 100 UNIT/ML injection pen Inject 20 Units into the skin nightly 5 pen 2    metFORMIN  (GLUCOPHAGE ) 500 MG tablet TAKE TWO TABLETS BY MOUTH TWICE A DAY WITH MEALS 270 tablet 1    ferrous sulfate  (IRON  325) 325 (65 Fe) MG tablet Take 1 tablet by mouth daily (with breakfast) 90 tablet 2    Cholecalciferol (VITAMIN D3) 50 MCG (2000 UT) TABS TAKE ONE TABLET BY MOUTH DAILY 90 tablet 2    dapagliflozin  (FARXIGA ) 10 MG tablet Take 1 tablet by mouth every morning 90 tablet 1    Insulin  Pen Needle 32G X 6 MM MISC Once daily 90 each 2    blood glucose monitor strips Tes2t qam  times a day & as needed for symptoms of irregular blood glucose. Please provide test strips approved by insurance. 100 strip 2    losartan  (COZAAR ) 25 MG tablet TAKE 1 TABLET BY MOUTH EVERY DAY 30 tablet 5    Liraglutide  (VICTOZA ) 18 MG/3ML SOPN SC injection Inject 1.2 mg into the skin daily 2 pen 3    montelukast  (SINGULAIR ) 10 MG tablet TAKE ONE TABLET BY MOUTH DAILY 30 tablet 1    simvastatin  (ZOCOR ) 40 MG tablet  TAKE ONE TABLET BY MOUTH DAILY 30 tablet 3    albuterol  sulfate HFA 108 (90 Base) MCG/ACT inhaler Inhale 2 puffs into the lungs every 6 hours as needed for Wheezing or Shortness of Breath 1 Inhaler 5    cetirizine  (ZYRTEC ) 10 MG tablet TAKE ONE TABLET BY MOUTH DAILY 30 tablet 1    Insulin  Pen Needle ([employer] PEN NEEDLES 31G) 31G X 8 MM MISC 1 each by Does not apply route daily 100 each 3    Insulin  Pen Needle ([employer] PEN NEEDLES 29G) 29G X MISC 1 each by Does not apply route daily 100 each 3    valACYclovir  (VALTREX ) 500 MG tablet TAKE 1 TABLET BY MOUTH DAILY 90 tablet 5    Lancets MISC 1 each by Does not apply route daily Please provide lancets approved by insurance. 100 each 3    Blood Glucose Monitoring Suppl (ACURA BLOOD GLUCOSE METER) w/Device KIT Please provide meter approved by insurance.  Check bs qam 1 kit 0    Lactobacillus  (ACIDOPHILUS) TABS TAKE 1 TABLET BY MOUTH EVERY DAY 30 tablet 5    ibuprofen  (ADVIL ;MOTRIN ) 600 MG tablet Take 1 tablet by mouth every 6 hours as needed for Pain 30 tablet 2    rizatriptan  (MAXALT ) 10 MG tablet Take 1 tablet by mouth once as needed for Migraine May repeat in 2 hours if needed 30 tablet 3     Current Facility-Administered Medications   Medication Dose Route Frequency Provider Last Rate Last Admin    tuberculin injection 5 Units  5 Units IntraDERmal Once Lavanya Roa E Andres, [name]   5 Units at 06/06/20 1628       Review of Systems   Constitutional: Negative for chills, fatigue and fever.   HENT: Negative for congestion, sinus pressure and sinus pain.    Respiratory: Negative for cough, shortness of breath and wheezing.    Cardiovascular: Negative for chest pain and palpitations.   Gastrointestinal: Negative for abdominal pain, constipation and diarrhea.   Musculoskeletal: Negative for arthralgias, back pain and myalgias.   Skin: Negative for color change, pallor and rash.   Neurological: Negative for dizziness, syncope, weakness, light-headedness and headaches.   Psychiatric/Behavioral: Positive for dysphoric mood. Negative for behavioral problems, confusion and sleep disturbance. The patient is not nervous/anxious.        Vitals:    06/06/20 1557   BP: 120/70   Site: Left Upper Arm   Position: Sitting   Cuff Size: Large Adult   Pulse: 92   Temp: 96.8 F (36 C)   SpO2: 99%   Weight: 210 lb (95.3 kg)   Height: 5' 4 (1.626 m)       Physical Exam  Constitutional:       Appearance: She is well-developed.   HENT:      Head: Normocephalic and atraumatic.   Eyes:      Conjunctiva/sclera: Conjunctivae normal.      Pupils: Pupils are equal, round, and reactive to light.   Neck:      Thyroid: No thyromegaly.      Vascular: No JVD.   Cardiovascular:      Rate and Rhythm: Normal rate and regular rhythm.      Heart sounds: Normal heart sounds.   Pulmonary:      Effort: Pulmonary effort is normal. No  respiratory distress.      Breath sounds: Normal breath sounds. No wheezing.   Musculoskeletal:  General: No deformity. Normal range of motion.      Cervical back: Normal range of motion and neck supple.   Skin:     General: Skin is warm and dry.      Capillary Refill: Capillary refill takes less than 2 seconds.   Neurological:      Mental Status: She is alert and oriented to person, place, and time.   Psychiatric:         Behavior: Behavior normal.       An electronic signature was used to authenticate this note.    --[name], [name]

## 2020-06-07 NOTE — Assessment & Plan Note (Signed)
 Stable, controlled   Continue levothyroxine 

## 2020-06-07 NOTE — Assessment & Plan Note (Signed)
Stable, controlled.  Continue current regimen 

## 2020-06-07 NOTE — Assessment & Plan Note (Signed)
 Symptoms improving   Having a dog has helped greatly   She has good support from family   Call if symptoms worsen.

## 2020-06-07 NOTE — Assessment & Plan Note (Signed)
A1c 11.8%  Has been off medication   Restart lantus, metformin, victoza   Monitor BS at home   Discussed diet   Follow up in 2 months

## 2020-10-26 NOTE — Telephone Encounter (Signed)
 Called pt [facility] to make an appt. Her A1C is high by 13%.    NP Noni Door called to [facility] . Bernice stated to call pt and make an appt.    Waiting for call back from Pt.

## 2020-10-26 NOTE — Telephone Encounter (Signed)
error 

## 2021-01-22 ENCOUNTER — Encounter

## 2021-01-22 NOTE — Telephone Encounter (Signed)
LOV 06/06/20      FOV NOT SCHEDULED

## 2021-01-25 NOTE — Telephone Encounter (Signed)
 Pt will call back to schedule

## 2021-04-10 NOTE — Unmapped (Signed)
Pt is requesting to schedule with Dr.Thomas, please advise

## 2021-04-10 NOTE — Unmapped (Signed)
Returned pt call. Pt states that she last saw Dr. Maisie Fus due to uterine fibroids for which she had surgery to remove. Pt states that her uterine fibroid has returned and wants to see Dr. Maisie Fus. Scheduled pt for est visit on 1/17 at 2:45 PM at [facility].

## 2021-04-26 DIAGNOSIS — M3322 Polymyositis with myopathy: Secondary | ICD-10-CM | POA: Diagnosis not present

## 2021-05-01 ENCOUNTER — Ambulatory Visit: Admit: 2021-05-01 | Discharge: 2021-05-01 | Payer: PRIVATE HEALTH INSURANCE | Primary: Oncology

## 2021-05-01 DIAGNOSIS — N920 Excessive and frequent menstruation with regular cycle: Secondary | ICD-10-CM

## 2021-05-01 LAB — COMPREHENSIVE METABOLIC PANEL
A/G Ratio: 1.2 (ref 1.2–2.2)
ALT: 11 [IU]/L (ref 0–32)
AST: 11 [IU]/L (ref 0–40)
Albumin: 4.1 g/dL (ref 3.8–4.8)
Alkaline Phosphatase: 174 [IU]/L — ABNORMAL HIGH (ref 44–121)
BUN/Creatinine Ratio: 15 (ref 9–23)
BUN: 10 mg/dL (ref 6–20)
CO2: 20 mmol/L (ref 20–29)
Calcium: 9.4 mg/dL (ref 8.7–10.2)
Chloride: 102 mmol/L (ref 96–106)
Creatinine: 0.66 mg/dL (ref 0.57–1.00)
EGFR: 117 mL/min/{1.73_m2} (ref >59)
Globulin, Total: 3.3 g/dL (ref 1.5–4.5)
Glucose: 437 mg/dL — ABNORMAL HIGH (ref 70–99)
Potassium: 4.2 mmol/L (ref 3.5–5.2)
Sodium: 137 mmol/L (ref 134–144)
Total Bilirubin: 0.4 mg/dL (ref 0.0–1.2)
Total Protein: 7.4 g/dL (ref 6.0–8.5)

## 2021-05-01 LAB — CBC
Hematocrit: 36.3 % (ref 34.0–46.6)
Hemoglobin: 10.6 g/dL (ref 11.1–15.9)
MCH: 21.3 pg (ref 26.6–33.0)
MCHC: 29.2 g/dL (ref 31.5–35.7)
MCV: 73 fL (ref 79–97)
Platelets: 317 10*3/uL (ref 150–450)
RBC: 4.97 x10E6/uL (ref 3.77–5.28)
RDW: 27.6 % (ref 11.7–15.4)
WBC: 9.4 10*3/uL (ref 3.4–10.8)

## 2021-05-01 LAB — ANTIBODY SCREEN: Antibody Screen: NEGATIVE

## 2021-05-01 LAB — RPR: RPR: NONREACTIVE

## 2021-05-01 LAB — SPINAL MUSCULAR ATROPHY (SMA)

## 2021-05-01 LAB — HEMOGLOBINOPATHY EVALUATION
Hgb A2 Quant: 2.3 % (ref 1.8–3.2)
Hgb A: 97.7 % (ref 96.4–98.8)
Hgb F Quant: 0 % (ref 0.0–2.0)
Hgb S: 0 %

## 2021-05-01 LAB — CYSTIC FIBROSIS, 97 VARIANTS: Interpretation: (Cystic Fibrosis Mutation 97): NOT DETECTED

## 2021-05-01 LAB — HEMOGLOBIN A1C: Hemoglobin A1C: 10.7 % (ref 4.8–5.6)

## 2021-05-01 LAB — HEPATITIS C AB W/REFLEX TO HCV RNA, QN, PCR: HCV Ab: 0.2 {s_co_ratio} (ref 0.0–0.9)

## 2021-05-01 LAB — PANEL 083935: HIV Screen 4th Generation wRfx: NONREACTIVE

## 2021-05-01 LAB — RUBELLA IMMUNE STATUS: Rubella Antibodies, IgG: 7.3 index (ref >0.99)

## 2021-05-01 LAB — ABO/RH: Rh Type: POSITIVE

## 2021-05-01 LAB — VARICELLA ZOSTER ANTIBODY, IGG: Varicella IgG: 983 {index} (ref >165)

## 2021-05-01 LAB — HEPATITIS B SURFACE ANTIGEN: Hep B Surface Ag: NEGATIVE

## 2021-05-01 LAB — INTERPRETATION: (HCV AB)

## 2021-05-01 MED ORDER — norethindrone (AYGESTIN) 5 mg tablet
5 | ORAL_TABLET | Freq: Every day | ORAL | 0 refills | Status: AC
Start: 2021-05-01 — End: 2021-05-08

## 2021-05-01 MED ORDER — letrozole (FEMARA) 2.5 mg tablet
2.5 | ORAL_TABLET | Freq: Every day | ORAL | 1 refills | Status: AC
Start: 2021-05-01 — End: 2021-05-08

## 2021-05-01 NOTE — Unmapped (Signed)
CC:  Follow-up for AUB-L    HPI: 36 y.o. [id number] female with history of large submucosal fibroid, partially resected via HSC myomectomy on 11/17/2018, s/p repeat procedure - hysteroscopic D&C, polypectomy on 02/17/2019.    ??  During the repeat procedure, a small posterior lower uterine polyp was visualized, however no evidence of intracavitary fibroid seen, even at low pressure.  Was last seen in our office for a post-op visit in Nov 2020.  ??  Pathology:  FINAL DIAGNOSIS:  Clinically uterine polyp:  - ??Minute fragments of inactive appearing endometrium, negative for  malignancy.  ??  Pt reports she is doing well.  Minimal bleeding after surgery, non since.  Has continued on medications.  Denies issues with n/v, diet, bowel/bladder.  No pain.  ??  She was previously taking NETA 10 mg and LE 2.5 mg one tablet daily. Now reports she is only on metformin. She desires to have one more child. LMP was 12/26. PMP was 11/29. Menses are heavy on CD2, changes pads every hr.     Pertinent History:  S/p HSC partial resection of large submucosal fibroid 11/17/18, HSC polypectomy 02/17/2019  T2DM, HLD, Hypothyroidism, Asthma??    Past Medical History:   Diagnosis Date   ??? Asthma    ??? Dermatitis    ??? DM (diabetes mellitus), type 2 (CMS Dx)    ??? Hypothyroid    ??? Varicella      Past Surgical History:   Procedure Laterality Date   ??? CYST REMOVAL     ??? HYSTEROSCOPY N/A 11/17/2018    Procedure: Hysteroscopic resection of fibroid;  Surgeon: Ali Lowe, MD;  [facility];  Service: Gynecology;  Laterality: N/A;   ??? WISDOM TOOTH EXTRACTION       Family History   Problem Relation Age of Onset   ??? Diabetes Mother    ??? Diabetes Father    ??? Melanoma Neg Hx        Current Outpatient Medications:   ???  albuterol (ACCUNEB) 0.63 mg/3 mL nebulizer solution, Inhale 1 ampule by nebulization every 6 hours as needed., Disp: , Rfl:   ???  ergocalciferol, vitamin D2, (VITAMIN D ORAL), Take by mouth., Disp: , Rfl:   ???  ferrous sulfate/vit C/folic ac (FERROUS  SULFATE-C-FOLIC ACID ORAL), Take by mouth., Disp: , Rfl:   ???  hydrocortisone 1 % cream, Apply topically 2 times a day., Disp: , Rfl:   ???  ibuprofen (MOTRIN) 600 MG tablet, Take 1 tablet (600 mg total) by mouth every 6 hours as needed for Pain., Disp: 30 tablet, Rfl: 0  ???  insulin degludec/liraglutide (XULTOPHY 100/3.6 SUBQ), Inject 20 Units subcutaneously., Disp: , Rfl:   ???  letrozole (FEMARA) 2.5 mg tablet, Take 1 tablet (2.5 mg total) by mouth daily. Indications: fibroids, Disp: 30 tablet, Rfl: 11  ???  levothyroxine (SYNTHROID, LEVOTHROID) 50 MCG tablet, Take 50 mcg by mouth daily., Disp: , Rfl:   ???  lidocaine (XYLOCAINE) 5 % ointment, Apply topically as needed., Disp: , Rfl:   ???  losartan (COZAAR) 25 MG tablet, Take 25 mg by mouth daily., Disp: , Rfl:   ???  metFORMIN (GLUCOPHAGE) 500 MG tablet, Take 1,000 mg by mouth 2 times a day with meals.    , Disp: , Rfl:   ???  montelukast (SINGULAIR) 10 mg tablet, Take 10 mg by mouth at bedtime (2100)., Disp: , Rfl:   ???  multivit-min/ferrous fumarate (MULTI VITAMIN ORAL), Take by mouth., Disp: , Rfl:   ???  norethindrone (AYGESTIN) 5 mg tablet, Take 2 tablets (10 mg total) by mouth daily., Disp: 60 tablet, Rfl: 3  ???  simvastatin (ZOCOR) 40 MG tablet, Take 40 mg by mouth at bedtime., Disp: , Rfl:   ???  valACYclovir (VALTREX) 500 MG tablet, Take 500 mg by mouth 2 times a day., Disp: , Rfl:     Allergies   Allergen Reactions   ??? Semaglutide Hives   ??? Lisinopril    ??? Peanut Swelling     Throat and tongue itch, angioedema of face  Throat and tongue itch, angioedema of face     ??? Victoza [Liraglutide]      hives       Review of Systems:  The following systems were reviewed and negative, except those noted in HPI: General, cardiovascular, respiratory, gastrointestinal, genitourinary, musculoskeletal, skin, neurologic, psychiatric, endocrine, heme, allergy    Objective:  There were no vitals filed for this visit.  General: No acute distress, alert & oriented x 3  Head: normocephalic,  pupils equal  Extremities: no significant edema  Skin: no rash or lesion  Pelvic: deferred    Results Review:     Lab Results   Component Value Date    HGB 10.7 (L) 12/01/2018    HCT 36.4 12/01/2018    CREATININE 0.70 11/13/2018    POCTAMH 4.29 02/04/2018    POCTPRLCTIN 16.92 02/04/2018    POCTSH 3.17 10/20/2018    HGBA1C 8.2 (H) 11/13/2018     No results found for: RUBELLAIGG, RUBNUM, VARICELLAIGG, HIV12ABAGN, HEPBSAG, HCVAB, RPR, LABRPR, TREPIA, CFRESULT, SMN1, SMN2, TECHRESULTS   Assessment:   36 y.o. [id number]   S/p HSC partial resection of large submucosal fibroid 11/17/18, HSC polypectomy 02/17/2019  T2DM, HLD, Hypothyroidism, Asthma??  Abnormal uterine bleeding       Plan:   - Hormonal evaluation: AMH, TSH, Prolactin  - Procreative management: CBC, CMP, Rubella, Varicella, T&S, STI's.   - Genetic Screening: SMA, CF, HgbE  - Infertility evaluation: SIS/HyCoSy next cycle and SA   - PNV + Folic Acid recommended  - Infertility Management: Likely will need a repeat myomectomy prior to    - Recommend restarting letrozole 5 (increased from 2.5) and NETA 5. Rx sent.  - Plan for repeat myomectomy in ~ April/May  - RTO for SIS/HyCoSy. Will review all testing and discuss infertility management at next follow up     I spent a total of 20 minutes of which 15 minutes was spent in counseling and/or coordination of care with patient and/or family. Topics discussed include submucous fibroid and menorrhagia    I saw and evaluated the patient. Agree with resident's note.  Will continue per plan as described above.  I performed the entire procedure  Casimiro Needle A. Maisie Fus, M.D.

## 2021-05-01 NOTE — Unmapped (Signed)
labs

## 2021-05-01 NOTE — Unmapped (Signed)
Addended by: Caleen JobsFITZGERALD, Darious Rehman C on: 05/02/2021 08:41 AM     Modules accepted: Orders

## 2021-05-02 LAB — POCT - PROLACTIN: POCT - Prolactin: 11.75

## 2021-05-02 LAB — POCT THYROID STIMULATING HORMONE (TSH): TSH, POC: 2.81

## 2021-05-02 LAB — POCT ANTI-MULLERIAN HORMONE: POCT - Anti-Mullerian Hormone: 2.6 ng/ml (ref 0.147–7.49)

## 2021-05-03 ENCOUNTER — Ambulatory Visit: Admit: 2021-05-03 | Payer: PRIVATE HEALTH INSURANCE | Attending: Oncology | Primary: Oncology

## 2021-05-03 DIAGNOSIS — M3322 Polymyositis with myopathy: Secondary | ICD-10-CM | POA: Diagnosis not present

## 2021-05-03 DIAGNOSIS — Z794 Long term (current) use of insulin: Secondary | ICD-10-CM

## 2021-05-03 MED ORDER — VICTOZA 18 MG/3ML SC SOPN
18 | Freq: Every day | SUBCUTANEOUS | 1 refills | 30.00000 days | Status: DC
Start: 2021-05-03 — End: 2021-06-08

## 2021-05-03 MED ORDER — CLINDAMYCIN PHOSPHATE 1 % EX GEL
1 | CUTANEOUS | 1 refills | 30.00000 days | Status: AC
Start: 2021-05-03 — End: 2021-05-10

## 2021-05-03 MED ORDER — FREESTYLE LIBRE 3 SENSOR MISC
3 refills | Status: DC
Start: 2021-05-03 — End: 2023-11-25

## 2021-05-03 MED ORDER — TRESIBA FLEXTOUCH 100 UNIT/ML SC SOPN
100 | Freq: Every day | SUBCUTANEOUS | 2 refills | 75.00000 days | Status: DC
Start: 2021-05-03 — End: 2021-05-17

## 2021-05-03 MED ORDER — LEVOTHYROXINE SODIUM 50 MCG PO TABS
50 | ORAL_TABLET | ORAL | 2 refills | Status: DC
Start: 2021-05-03 — End: 2021-06-07

## 2021-05-04 DIAGNOSIS — M3322 Polymyositis with myopathy: Secondary | ICD-10-CM | POA: Diagnosis not present

## 2021-05-04 NOTE — Assessment & Plan Note (Signed)
Stable, controlled.  Continue current regimen 

## 2021-05-04 NOTE — Telephone Encounter (Signed)
 Please start PA

## 2021-05-04 NOTE — Assessment & Plan Note (Signed)
"  Discussed diet and exercise   "

## 2021-05-04 NOTE — Progress Notes (Signed)
 Shelly Richard (DOB:  1986/02/12) is a 36 y.o. female,Established patient, here for evaluation of the following chief complaint(s):  Diabetes      ASSESSMENT/PLAN:  1. Type 2 diabetes mellitus without complication, with long-term current use of insulin  (HCC)  Assessment & Plan:  A1c 10.7  Has not taking medication for a few months.  Just restarted.  Change lantus  to Tresiba  due to nausea   Reduce Victoza  to 0.6 mg due to nausea, will titrate up as tolerated   Will arrange for CGM to track sugars   Follow up in 1-2 months     2. Need for hepatitis B vaccination  -     Hep B, ENGERIX-B, (age 2 yrs+), IM, 1mL, 3-dose  3. Essential hypertension  Assessment & Plan:  Stable, controlled  Continue current regimen  4. Acquired hypothyroidism  Assessment & Plan:  Recent TSH in care everywhere is normal  Continue current dose of Synthroid   5. Abnormal uterine bleeding  Assessment & Plan:  Working with GYN   6. Hidradenitis axillaris  Assessment & Plan:  Seeing Dermatology   Start Cleocin  topical as needed   7. Mild intermittent asthma without complication  Assessment & Plan:  Stable, controlled   Continue albuterol  prn   8. Iron  deficiency anemia, unspecified iron  deficiency anemia type  Assessment & Plan:  Secondary to menorrhagia   Continue iron    Working with GYN   CBC stable   9. Mixed hyperlipidemia  Assessment & Plan:  Stable, controlled   Check lipids   Continue simvastatin    10. Class 1 obesity due to excess calories with serious comorbidity and body mass index (BMI) of 33.0 to 33.9 in adult  Assessment & Plan:  Discussed diet and exercise       No follow-ups on file.    SUBJECTIVE/OBJECTIVE:  Diabetes  Pertinent negatives for hypoglycemia include no confusion, dizziness, headaches, nervousness/anxiousness or pallor. Pertinent negatives for diabetes include no chest pain, no fatigue and no weakness.   Patient is here for follow-up of her diabetes.  She states she was out of her medicines for a few months.  Recently  started taking them within the last week or so.  She saw GYN at [facility] and underwent lab work.  Recent A1c was 10.7.  She has not been checking her sugars routinely.  She does note nausea and GI upset since restarting her medications.  She feels it is either the Victoza  or Lantus  that is making her nauseous.  States the smell of it also makes her nauseous.  Asking for another long acting insulin .    He also reports frequent boils that have been popping up along her underarm, waistline.  This been an ongoing issue for some time.  She is seeing dermatology but not for another few months.  No current abscesses.    She is working with GYN for uterine fibroids. She is having dysmenorrhea and menorrhagia.     Current Outpatient Medications   Medication Sig Dispense Refill    Liraglutide  (VICTOZA ) 18 MG/3ML SOPN SC injection Inject 0.6 mg into the skin daily 2 Adjustable Dose Pre-filled Pen Syringe 1    Continuous Blood Gluc Sensor (FREESTYLE LIBRE 3 SENSOR) MISC Check glucose per diagnosis E11.9 6 each 3    Insulin  Degludec (TRESIBA  FLEXTOUCH) 100 UNIT/ML SOPN Inject 20 Units into the skin daily 4 Adjustable Dose Pre-filled Pen Syringe 2    clindamycin  (CLEOCIN -T) 1 % gel Apply topically 2 times daily. 30 g 1  levothyroxine  (SYNTHROID ) 50 MCG tablet TAKE ONE TABLET BY MOUTH DAILY 90 tablet 2    metFORMIN  (GLUCOPHAGE ) 500 MG tablet TAKE TWO TABLETS BY MOUTH TWICE A DAY WITH MEALS 270 tablet 1    ferrous sulfate  (IRON  325) 325 (65 Fe) MG tablet Take 1 tablet by mouth daily (with breakfast) 90 tablet 2    Cholecalciferol (VITAMIN D3) 50 MCG (2000 UT) TABS TAKE ONE TABLET BY MOUTH DAILY 90 tablet 2    dapagliflozin  (FARXIGA ) 10 MG tablet Take 1 tablet by mouth every morning 90 tablet 1    Insulin  Pen Needle 32G X 6 MM MISC Once daily 90 each 2    blood glucose monitor strips Tes2t qam  times a day & as needed for symptoms of irregular blood glucose. Please provide test strips approved by insurance. 100 strip 2    losartan   (COZAAR ) 25 MG tablet TAKE 1 TABLET BY MOUTH EVERY DAY 30 tablet 5    montelukast  (SINGULAIR ) 10 MG tablet TAKE ONE TABLET BY MOUTH DAILY 30 tablet 1    simvastatin  (ZOCOR ) 40 MG tablet TAKE ONE TABLET BY MOUTH DAILY 30 tablet 3    albuterol  sulfate HFA 108 (90 Base) MCG/ACT inhaler Inhale 2 puffs into the lungs every 6 hours as needed for Wheezing or Shortness of Breath 1 Inhaler 5    cetirizine  (ZYRTEC ) 10 MG tablet TAKE ONE TABLET BY MOUTH DAILY 30 tablet 1    Insulin  Pen Needle ([employer] PEN NEEDLES 31G) 31G X 8 MM MISC 1 each by Does not apply route daily 100 each 3    Insulin  Pen Needle ([employer] PEN NEEDLES 29G) 29G X MISC 1 each by Does not apply route daily 100 each 3    valACYclovir  (VALTREX ) 500 MG tablet TAKE 1 TABLET BY MOUTH DAILY 90 tablet 5    Lancets MISC 1 each by Does not apply route daily Please provide lancets approved by insurance. 100 each 3    Blood Glucose Monitoring Suppl (ACURA BLOOD GLUCOSE METER) w/Device KIT Please provide meter approved by insurance.  Check bs qam 1 kit 0    Lactobacillus (ACIDOPHILUS) TABS TAKE 1 TABLET BY MOUTH EVERY DAY 30 tablet 5    ibuprofen  (ADVIL ;MOTRIN ) 600 MG tablet Take 1 tablet by mouth every 6 hours as needed for Pain 30 tablet 2    EPINEPHrine  (EPIPEN ) 0.3 MG/0.3ML SOAJ injection       rizatriptan  (MAXALT ) 10 MG tablet Take 1 tablet by mouth once as needed for Migraine May repeat in 2 hours if needed 30 tablet 3     No current facility-administered medications for this visit.       Review of Systems   Constitutional:  Negative for chills, fatigue and fever.   HENT:  Negative for congestion, sinus pressure and sinus pain.    Respiratory:  Negative for cough, shortness of breath and wheezing.    Cardiovascular:  Negative for chest pain and palpitations.   Gastrointestinal:  Positive for nausea. Negative for abdominal pain, constipation and diarrhea.   Musculoskeletal:  Negative for arthralgias, back pain and myalgias.   Skin:  Negative for color change,  pallor and rash.   Neurological:  Negative for dizziness, syncope, weakness, light-headedness and headaches.   Psychiatric/Behavioral:  Negative for behavioral problems, confusion and sleep disturbance. The patient is not nervous/anxious.      Vitals:    05/03/21 1555   BP: 124/78   Site: Left Upper Arm   Position: Sitting   Cuff  Size: Medium Adult   Pulse: 89   Temp: 97.1 F (36.2 C)   SpO2: 97%   Weight: 194 lb (88 kg)       Physical Exam  Constitutional:       Appearance: She is well-developed.   HENT:      Head: Normocephalic and atraumatic.   Eyes:      Conjunctiva/sclera: Conjunctivae normal.      Pupils: Pupils are equal, round, and reactive to light.   Neck:      Thyroid: No thyromegaly.      Vascular: No JVD.   Cardiovascular:      Rate and Rhythm: Normal rate and regular rhythm.      Heart sounds: Normal heart sounds.   Pulmonary:      Effort: Pulmonary effort is normal. No respiratory distress.      Breath sounds: Normal breath sounds. No wheezing.   Musculoskeletal:         General: Normal range of motion.      Cervical back: Normal range of motion and neck supple.   Skin:     General: Skin is warm and dry.      Capillary Refill: Capillary refill takes less than 2 seconds.   Neurological:      Mental Status: She is alert and oriented to person, place, and time.   Psychiatric:         Behavior: Behavior normal.               An electronic signature was used to authenticate this note.    --[name], [name]

## 2021-05-04 NOTE — Assessment & Plan Note (Signed)
 Working with GYN

## 2021-05-04 NOTE — Telephone Encounter (Signed)
 Pt is calling and stating that she needs a prior auth for Tresiba .

## 2021-05-04 NOTE — Assessment & Plan Note (Signed)
 Secondary to menorrhagia   Continue iron    Working with GYN   CBC stable

## 2021-05-04 NOTE — Telephone Encounter (Signed)
From: Shelly Richard  To: Shelly Richard  Sent: 05/03/2021 5:06 PM EST  Subject: Glucose Monitor    The monitor I got from you can not be use with the version on the app. Do you have another one? A newer version?

## 2021-05-04 NOTE — Assessment & Plan Note (Signed)
 A1c 10.7  Has not taking medication for a few months.  Just restarted.  Change lantus  to Tresiba  due to nausea   Reduce Victoza  to 0.6 mg due to nausea, will titrate up as tolerated   Will arrange for CGM to track sugars   Follow up in 1-2 months

## 2021-05-04 NOTE — Assessment & Plan Note (Signed)
 Recent TSH in care everywhere is normal  Continue current dose of Synthroid 

## 2021-05-04 NOTE — Assessment & Plan Note (Signed)
Stable, controlled   Continue albuterol prn

## 2021-05-04 NOTE — Assessment & Plan Note (Signed)
Stable, controlled   Check lipids   Continue simvastatin

## 2021-05-04 NOTE — Assessment & Plan Note (Signed)
 Seeing Dermatology   Start Cleocin  topical as needed

## 2021-05-07 NOTE — Unmapped (Signed)
Pt called to report menses start today. Pt scheduled SIS/HyCoSy on 05/15/21 @ 11:00am [facility]. Pt instructed to eat breakfast, arrive 15 minutes early and take acetaminophen 900-1000mg  or ibuprofen 600-800mg  on hour prior to procedure. Pt verbalized understanding. No questions or concerns at this time.

## 2021-05-08 MED ORDER — letrozole (FEMARA) 2.5 mg tablet
2.5 | ORAL_TABLET | Freq: Every day | ORAL | 11 refills | Status: AC
Start: 2021-05-08 — End: 2021-06-07

## 2021-05-08 MED ORDER — norethindrone (AYGESTIN) 5 mg tablet
5 | ORAL_TABLET | Freq: Every day | ORAL | 11 refills | Status: AC
Start: 2021-05-08 — End: 2021-06-07

## 2021-05-08 MED ORDER — FREESTYLE LIBRE 2 SENSOR MISC
1 refills | 30.00000 days | Status: DC
Start: 2021-05-08 — End: 2021-12-25

## 2021-05-08 NOTE — Telephone Encounter (Signed)
 Submitted PA for Tresiba  FlexTouch (insulin  degludec injection) 100 Units/mL solution  Via CMM Key: [id number] STATUS: PENDING

## 2021-05-08 NOTE — Unmapped (Signed)
Patient LE instructions and quantity are incorrect.  She is take LE 5 mg 2 pills BID she only received 30 pills which is only good for 15 days.  She needs a 30 day RF. The resident put the RX in and only gave her 15 days work of medication.

## 2021-05-08 NOTE — Unmapped (Signed)
Pt states that she was told to increase her aygestin to taking it once a day to two times a day. Pt thought she was supposed to take aygestin 5 mg BID x 30 days, but her prescription she was sent is only for 30 pills. This will only cover her for 15 days.     Pt wants to confirm if she needs to take aygestin 5 mg BID for 15 days or 30 days. If it is 30 days, she will need another prescription for aygestin.     Please advise - thank you!

## 2021-05-08 NOTE — Unmapped (Signed)
Called patient and clarified dosing. She did only receive a 15 day supply as she should have received 60 tablets of the LE 2.5 (taking 2 tablets, 5 mg total) daily, but only received 30 tablets. Will correct both prescriptions now and send refills. She understands to take LE 5 and NETA 5 daily. Pt appreciative of the call.

## 2021-05-11 NOTE — Telephone Encounter (Signed)
 Do we know if this has been started?

## 2021-05-11 NOTE — Telephone Encounter (Signed)
 That's fine. Thanks!

## 2021-05-14 LAB — HEMOGLOBIN A1C: Hemoglobin A1C: 10.7 %

## 2021-05-15 ENCOUNTER — Ambulatory Visit
Admit: 2021-05-15 | Discharge: 2021-05-15 | Payer: PRIVATE HEALTH INSURANCE | Attending: Reproductive Endocrinology | Primary: Oncology

## 2021-05-15 DIAGNOSIS — Z01818 Encounter for other preprocedural examination: Secondary | ICD-10-CM

## 2021-05-15 NOTE — Unmapped (Signed)
SIS with HyCoSy - Procedure Note    Indication/Diagnosis  S/p HSC partial resection of large submucosal fibroid 11/17/18, HSC polypectomy 02/17/2019  T2DM, HLD, Hypothyroidism, Asthma??  Abnormal uterine bleeding     Ordering Provider  Casimiro Needle A. Maisie Fus, MD/Emily Doreene Adas MD    No LMP recorded.   Urine HCG test (today): neg    Informed Consent  The risks, benefits, and alternatives were discussed with the patient; including but not limited to bleeding, infection and damage to surrounding structures/perforation, or disruption of occult pregnancy.  She acknowledged these risks chose to proceed.     Procedure in Detail  Patient was placed in the dorsal lithotomy position.  A speculum was inserted into the vagina.  The cervix was identified, no concerning lesions were noted.  The cervix was cleansed.  The sonohystergram catheter was inserted using sterile technique and inflated. The speculum was removed and the transvaginal ultrasound probe inserted.    HyCoSy  Tubal assessment was performed. Using syringes, saline & air as contrast were slowly instilled into the cavity through the [facility] catheter. Transverse images near the cornus were obtained. Bilateral adnexa were evaluated for contrast flow through the fallopian tubes. Proximal & distal tubal fill & spill were evaluated bilaterally; the presence of new fluid accumulation in the culdesac was also assessed (see findings).    Saline infusion sonogram  Sterile saline was injected through the [facility] 2 catheter. Distention was adequate.  The ultrasound images were obtained, then the ultrasound probe removed. The catheter was removed from the uterus and discarded.    Balloon Catheter:  Yes  Saline:  Yes  Tenaculum:  No  Betadine Prep:  Yes  NSAID (pre-procedure):  yes   Complications:  None      FINDINGS:  Uterus: measures 123.2 x 92.4 x 96.8 mm, EMS = 7.4 mm, HG;  Intramural myoma.   # Myoma's:  2, Type 1;  Size:  57.5 x 62.6 mm, noted to protrude into uterine cavity (see photos  below); smaller type 6 or 7 (pedunculated), 2 cm  Uterine Cavity:  Type 1 fibroid noted to deeply invade into cavity  Right Ovary:  24.8 x 36.1 x 31.0 mm, AFC 5 - 6  Left Ovary: 43.0 x 19.9 x 28.6 mm, AFC 5  Right Tube:  Fill and spill not visualized  Left Tube:  Fill and spill not visualized    IMPRESSION:   Cavity disrupted by type 1 fibroid, 6 cm  Fibroid uterus  Bilateral patent tubes occluded    PLAN:  Briefly discussed options, but discussed that if desiring to conceive, fibroid likely will affect embryo implantation negatively.  Also could affect abnormal uterine bleeding.  Briefly reviewed recent lab results to address patient's specific questions, but discussed that we will discuss in more detail at her next steps appt/follow-up  Discussed that the fibroid may be obstructing fallopian tubes.  Unable to confirm patency today.   RTO to discuss next steps/options.  States that she is presently taking Aygestin/letrozole    I personally saw, examined, and counseled the patient with the fellow/resident and agree with the documented assessment and plan above.    I spent 30 minutes face to face with this patient with greater than 50% of this time spent in counseling and coordination of care discussing items in the Assessment and Plan above. I also spent 10 minutes on the same day as the encounter performing additional activities related to this service. This time excludes time spent on any separately billed  services.     Letitia Libra, MD

## 2021-05-17 MED ORDER — TRESIBA FLEXTOUCH 100 UNIT/ML SC SOPN
100 | Freq: Every day | SUBCUTANEOUS | 2 refills | 75.00000 days | Status: DC
Start: 2021-05-17 — End: 2021-06-07

## 2021-05-17 NOTE — Telephone Encounter (Signed)
 Pt called the pharmacy.    Pharmacy stated that pt's insurance wants her to try insulin  that is less expensive.    I asked the pt to call her pharmacy insurance to see what is formulary and pt will give us  a call back to let us  know.

## 2021-05-17 NOTE — Telephone Encounter (Signed)
 Please sign order so that I can done message

## 2021-05-18 MED ORDER — LEVEMIR FLEXTOUCH 100 UNIT/ML SC SOPN
100 | Freq: Every evening | SUBCUTANEOUS | 3 refills | 38.50000 days | Status: DC
Start: 2021-05-18 — End: 2021-10-05

## 2021-05-21 MED ORDER — LOSARTAN POTASSIUM 25 MG PO TABS
25 | ORAL_TABLET | ORAL | 1 refills | Status: DC
Start: 2021-05-21 — End: 2021-06-26

## 2021-05-21 MED ORDER — FARXIGA 10 MG PO TABS
10 | ORAL_TABLET | ORAL | 1 refills | Status: DC
Start: 2021-05-21 — End: 2021-12-25

## 2021-05-21 NOTE — Telephone Encounter (Signed)
 LOV 1.19.23  FOV 2.23.23

## 2021-05-30 ENCOUNTER — Ambulatory Visit
Admit: 2021-05-30 | Discharge: 2021-05-30 | Payer: PRIVATE HEALTH INSURANCE | Attending: Reproductive Endocrinology | Primary: Oncology

## 2021-05-30 DIAGNOSIS — D219 Benign neoplasm of connective and other soft tissue, unspecified: Secondary | ICD-10-CM

## 2021-05-30 MED ORDER — levothyroxine (SYNTHROID) 75 MCG tablet
75 | ORAL_TABLET | Freq: Every morning | ORAL | 3 refills | Status: AC
Start: 2021-05-30 — End: ?

## 2021-05-30 NOTE — Unmapped (Signed)
CC:  Follow up, discuss test results/next steps     HPI: 36 y.o. [id number] female here today to discuss the results of her diagnostic testing and discuss treatment options. LMP 1/23. Not actively TTC, but also not preventing it.     Diagnosis:  Type I fibroid on SIS/HyCoSy  S/p HSC partial resection of large submucosal fibroid 11/17/18, HSC polypectomy 02/17/2019  Poorly controlled T2DM  HLD  Hypothyroidism  Asthma??  Abnormal uterine bleeding??  Bilateral tubal occlusion    Pertinent History:  Cavity/tubal assessment: SIS/HyCoSy (04/2021): Type 1 fibroid deeply invading, bilateral tubal occlusion  Ovarian reserve: AMH 2.6 (04/2021)  Genetic carrier screening: Neg /low risk CF & SMA testing    Prior treatments: None    Partner history: No history/current partner listed    Review of Systems:  The following systems were reviewed and negative, except those noted in HPI: General, cardiovascular, respiratory, gastrointestinal, genitourinary, musculoskeletal, skin, neurologic, psychiatric, endocrine, heme, allergy    Objective:  Vitals:    05/30/21 1107   BP: 118/80   Weight: 193 lb (87.5 kg)     General: No acute distress, alert & oriented x 3  Head: normocephalic, pupils equal  Extremities: no significant edema  Skin: no rash or lesion  Pelvic: deferred    Results Review:     Lab Results   Component Value Date    ABOGROUP B 05/01/2021    RH Positive 05/01/2021    ABS Negative 05/01/2021    HGB 10.6 (L) 05/01/2021    HCT 36.3 05/01/2021    CREATININE 0.66 05/01/2021    POCTAMH 2.60 05/02/2021    POCTPRLCTIN 11.75 05/02/2021    POCTSH 2.81 05/02/2021    HGBA1C 10.7 (H) 05/01/2021     Lab Results   Component Value Date    RUBELLAIGG 7.30 05/01/2021    VARICELLAIGG 983 05/01/2021    HEPBSAG Negative 05/01/2021    HCVAB 0.2 05/01/2021    RPR Non Reactive 05/01/2021      Assessment:   36 y.o. [id number]   Concern for Type I fibroid on SIS  S/p HSC partial resection of large submucosal fibroid 11/17/18, HSC polypectomy 02/17/2019  T2DM -  poorly controlled  HLD  Hypothyroidis  Asthma??  Abnormal uterine bleeding??  Bilateral tubal occlusion    Plan:   -  Reviewed with patient all new/pertinent results  - Sees Dr. Emeline Gins for DM management. Now has a continuous glucose monitor. Discussed that we want A1c < 6.5, ideally <6, but won't start Tx until <6.5.  - Recommend that she comes off of the losartan and simvastatin prior to pregnancy . Discussed that pt should discuss with prescribing physician to optimize meds that are safe in pregnancy prior to conceiiving  - Discussed that we want the TSH < 2.5 in someone trying to conceive. Recommend increasing dose to 75 mcg. New Rx sent  -  Today, I counseled the patient on the effects of fibroids on pregnancy.  I explained that the risks are related the number, size and location of fibroids.  I explained that some women do not have any complications during pregnancy but the most common complication is pain.  Other possible complications include miscarriage, abnormal placentation/abruption, growth restriction, bleeding in pregnancy and preterm labor/birth.  Also, fibroid may also be obstructing the fallopian tubes as was not able to confirm patency on HyCoSy  -  Discussed removing fibroid and various approaches    Given patient's situation would recommend: Hysteroscopic removal of fibroid. Next  surgery date available is April 5. Needs a preop H&P with PCP for clerance within 30 days of surgery. Needs A1c optimized prior  -  Patient would like to move forward with hysteroscopic removal of fibroid  -  RTO for preop visit. Needs to be within a 30-day timeframe of scheduled surgery. Pt is aware    I personally saw, examined, and counseled the patient with the fellow/resident/student and agree with the documented assessment and plan above.    Rolley Sims, MD    Number and Complexity of Problems Addressed  2 or more stable chronic illnesses    Amount and/or Complexity of Data to be Reviewed and Analyzed  3+ review  of the results of each unique test    Risk of Complications and/or Morbidity or Mortality of Patient Management  Moderate

## 2021-05-30 NOTE — Telephone Encounter (Signed)
 [employer] 2 sensor needs a PA

## 2021-05-31 NOTE — Telephone Encounter (Addendum)
 Submitted PA for [employer] 2 Sensor  Via [facility] Key: [id number]  STATUS: PA Not Required; letter attached.    If this requires a response please respond to the pool ( P MHCX PSC MEDICATION PRE-AUTH).      Thank you please advise patient.

## 2021-06-01 NOTE — Telephone Encounter (Signed)
 Yes just waiting on status to come back

## 2021-06-01 NOTE — Telephone Encounter (Signed)
Have we done a PA?

## 2021-06-07 ENCOUNTER — Ambulatory Visit: Admit: 2021-06-07 | Discharge: 2021-06-07 | Payer: PRIVATE HEALTH INSURANCE | Attending: Oncology | Primary: Oncology

## 2021-06-07 DIAGNOSIS — Z794 Long term (current) use of insulin: Secondary | ICD-10-CM

## 2021-06-07 LAB — POCT GLYCOSYLATED HEMOGLOBIN (HGB A1C): Hemoglobin A1C: 6.7 %

## 2021-06-07 MED ORDER — LEVOTHYROXINE SODIUM 75 MCG PO TABS
75 | ORAL_TABLET | ORAL | 1 refills | Status: DC
Start: 2021-06-07 — End: 2022-05-22

## 2021-06-07 MED ORDER — SIMVASTATIN 40 MG PO TABS
40 | ORAL_TABLET | ORAL | 1 refills | Status: DC
Start: 2021-06-07 — End: 2021-12-25

## 2021-06-07 MED ORDER — INSULIN LISPRO (1 UNIT DIAL) 100 UNIT/ML SC SOPN
100 | SUBCUTANEOUS | 2 refills | 75.00000 days | Status: DC
Start: 2021-06-07 — End: 2021-12-20

## 2021-06-07 NOTE — Patient Instructions (Signed)
2 units for BS 150-200,   4 units for BS 201-250,   6 units for BS 251-300,   8 units for BS 301-350,   10 units for BS greater than 350 with meals

## 2021-06-07 NOTE — Progress Notes (Signed)
 Shelly Richard (DOB:  [birth year]) is a 36 y.o. female,Established patient, here for evaluation of the following chief complaint(s):  Follow-up      ASSESSMENT/PLAN:  1. Type 2 diabetes mellitus without complication, with long-term current use of insulin  (HCC)  Assessment & Plan:  A1c down to 6.4   Still having BS in 200s after meals   Will add prandial insulin  per sliding scale   Continue Victoza , Levemir , metformin , farxiga    Call with any hypoglycemia episodes   Orders:  -     POCT glycosylated hemoglobin (Hb A1C)  2. Uterine leiomyoma, unspecified location  Assessment & Plan:  Planning on surgery in May/June   Would like better control of DM prior to surgery       No follow-ups on file.    SUBJECTIVE/OBJECTIVE:  HPI  Here for follow-up of her diabetes.  She has been working on diet.  She has been tracking her blood sugars and diet daily.  Blood sugars ranging from low 100s to low 200s.  She does report blood sugars in the low 200s following certain meals.  Fasting glucose in mid 100s typically, and occasionally around 200.  She is doing well on her regimen.  She is taking Victoza  at 1.2 mg.  She is compliant with her Levemir .  She is taking her metformin  and Farxiga  again.  Denies hypoglycemia.    Current Outpatient Medications   Medication Sig Dispense Refill    Liraglutide  (VICTOZA ) 18 MG/3ML SOPN SC injection Inject 1.2 mg into the skin daily 2 Adjustable Dose Pre-filled Pen Syringe 1    insulin  lispro, 1 Unit Dial , (HUMALOG  KWIKPEN) 100 UNIT/ML SOPN Inject 2 units for BS 150-200, 4 units for BS 201-250, 6 units for BS 251-300, 8 units for BS 301-350, 10 units for BS greater than 350 with meals 4 Adjustable Dose Pre-filled Pen Syringe 2    simvastatin  (ZOCOR ) 40 MG tablet TAKE ONE TABLET BY MOUTH DAILY 90 tablet 1    levothyroxine  (SYNTHROID ) 75 MCG tablet TAKE ONE TABLET BY MOUTH DAILY 90 tablet 1    FARXIGA  10 MG tablet TAKE ONE TABLET BY MOUTH EVERY MORNING 90 tablet 1    losartan  (COZAAR ) 25 MG tablet  TAKE ONE TABLET BY MOUTH DAILY 90 tablet 1    insulin  detemir (LEVEMIR  FLEXTOUCH) 100 UNIT/ML injection pen Inject 20 Units into the skin nightly 5 Adjustable Dose Pre-filled Pen Syringe 3    Continuous Blood Gluc Sensor (FREESTYLE LIBRE 2 SENSOR) MISC Use as directed 6 each 1    Continuous Blood Gluc Sensor (FREESTYLE LIBRE 3 SENSOR) MISC Check glucose per diagnosis E11.9 6 each 3    EPINEPHrine  (EPIPEN ) 0.3 MG/0.3ML SOAJ injection       metFORMIN  (GLUCOPHAGE ) 500 MG tablet TAKE TWO TABLETS BY MOUTH TWICE A DAY WITH MEALS 270 tablet 1    ferrous sulfate  (IRON  325) 325 (65 Fe) MG tablet Take 1 tablet by mouth daily (with breakfast) 90 tablet 2    Cholecalciferol (VITAMIN D3) 50 MCG (2000 UT) TABS TAKE ONE TABLET BY MOUTH DAILY 90 tablet 2    Insulin  Pen Needle 32G X 6 MM MISC Once daily 90 each 2    blood glucose monitor strips Tes2t qam  times a day & as needed for symptoms of irregular blood glucose. Please provide test strips approved by insurance. 100 strip 2    montelukast  (SINGULAIR ) 10 MG tablet TAKE ONE TABLET BY MOUTH DAILY 30 tablet 1    albuterol  sulfate HFA  108 (90 Base) MCG/ACT inhaler Inhale 2 puffs into the lungs every 6 hours as needed for Wheezing or Shortness of Breath 1 Inhaler 5    cetirizine  (ZYRTEC ) 10 MG tablet TAKE ONE TABLET BY MOUTH DAILY 30 tablet 1    Insulin  Pen Needle ([employer] PEN NEEDLES 31G) 31G X 8 MM MISC 1 each by Does not apply route daily 100 each 3    Insulin  Pen Needle ([employer] PEN NEEDLES 29G) 29G X MISC 1 each by Does not apply route daily 100 each 3    valACYclovir  (VALTREX ) 500 MG tablet TAKE 1 TABLET BY MOUTH DAILY 90 tablet 5    Lancets MISC 1 each by Does not apply route daily Please provide lancets approved by insurance. 100 each 3    Blood Glucose Monitoring Suppl (ACURA BLOOD GLUCOSE METER) w/Device KIT Please provide meter approved by insurance.  Check bs qam 1 kit 0    Lactobacillus (ACIDOPHILUS) TABS TAKE 1 TABLET BY MOUTH EVERY DAY 30 tablet 5    ibuprofen   (ADVIL ;MOTRIN ) 600 MG tablet Take 1 tablet by mouth every 6 hours as needed for Pain 30 tablet 2    rizatriptan  (MAXALT ) 10 MG tablet Take 1 tablet by mouth once as needed for Migraine May repeat in 2 hours if needed 30 tablet 3     No current facility-administered medications for this visit.       Review of Systems   Constitutional:  Negative for chills, fatigue and fever.   HENT:  Negative for congestion, sinus pressure and sinus pain.    Respiratory:  Negative for cough, shortness of breath and wheezing.    Cardiovascular:  Negative for chest pain and palpitations.   Gastrointestinal:  Negative for abdominal pain, constipation and diarrhea.   Musculoskeletal:  Negative for arthralgias, back pain and myalgias.   Skin:  Negative for color change, pallor and rash.   Neurological:  Negative for dizziness, syncope, weakness, light-headedness and headaches.   Psychiatric/Behavioral:  Negative for behavioral problems, confusion and sleep disturbance. The patient is not nervous/anxious.      Vitals:    06/07/21 1607   BP: 110/78   Site: Right Upper Arm   Position: Sitting   Cuff Size: Large Adult   Pulse: 100   Temp: 98.3 F (36.8 C)   SpO2: 99%   Weight: 194 lb (88 kg)   Height: 5' 4 (1.626 m)       Physical Exam  Constitutional:       Appearance: Normal appearance.   HENT:      Head: Normocephalic and atraumatic.   Eyes:      Extraocular Movements: Extraocular movements intact.   Pulmonary:      Effort: Pulmonary effort is normal.   Musculoskeletal:      Cervical back: Normal range of motion.   Skin:     Coloration: Skin is not jaundiced or pale.   Neurological:      General: No focal deficit present.      Mental Status: She is alert and oriented to person, place, and time.   Psychiatric:         Mood and Affect: Mood normal.         Behavior: Behavior normal.         Thought Content: Thought content normal.               An electronic signature was used to authenticate this note.    --[name], APRN -  CNP

## 2021-06-07 NOTE — Unmapped (Signed)
Called pt and informed her to call us in April and request to schedule her surgery with Dr. Doreene AdasHurley in June. Pt understood and appreciative of call.

## 2021-06-07 NOTE — Unmapped (Signed)
Pt calling wanted to see if she could get her surgery pushed to June please give her a call thank you.

## 2021-06-08 MED ORDER — VICTOZA 18 MG/3ML SC SOPN
18 | Freq: Every day | SUBCUTANEOUS | 1 refills | 30.00000 days | Status: DC
Start: 2021-06-08 — End: 2021-06-12

## 2021-06-08 NOTE — Assessment & Plan Note (Signed)
 A1c down to 6.4   Still having BS in 200s after meals   Will add prandial insulin  per sliding scale   Continue Victoza , Levemir , metformin , farxiga    Call with any hypoglycemia episodes

## 2021-06-08 NOTE — Assessment & Plan Note (Signed)
 Planning on surgery in May/June   Would like better control of DM prior to surgery

## 2021-06-11 MED ORDER — INSULIN PEN NEEDLE 32G X 6 MM MISC
2 refills | 34.00000 days | Status: DC
Start: 2021-06-11 — End: 2022-07-09

## 2021-06-11 NOTE — Telephone Encounter (Signed)
 Pt asking for a refill of Techlite pen needles   32G x 1/4    She is completely out and goes out of town tomorrow    [facility] [id number] - [address] [place] - [address] [address] [place] - P [phone] - F [phone]

## 2021-06-12 MED ORDER — VICTOZA 18 MG/3ML SC SOPN
18 | Freq: Every day | SUBCUTANEOUS | 1 refills | 30.00000 days | Status: DC
Start: 2021-06-12 — End: 2021-06-21

## 2021-06-12 MED ORDER — INSULIN PEN NEEDLE 32G X 6 MM MISC
5 refills | 34.00000 days | Status: AC
Start: 2021-06-12 — End: ?

## 2021-06-15 ENCOUNTER — Ambulatory Visit: Payer: PRIVATE HEALTH INSURANCE | Primary: Oncology

## 2021-06-21 MED ORDER — VICTOZA 18 MG/3ML SC SOPN
18 | Freq: Every day | SUBCUTANEOUS | 1 refills | 30.00000 days | Status: DC
Start: 2021-06-21 — End: 2021-11-02

## 2021-06-21 NOTE — Telephone Encounter (Signed)
 Spoke to pt and relayed message that a new script has been sent to preferred pharmacy. Pt confirmed understanding with no additional questions or concerns.

## 2021-06-21 NOTE — Telephone Encounter (Signed)
 Pt needs a new prescription for     Liraglutide  (VICTOZA ) 18 MG/3ML SOPN SC injection [[id number]]     Order Details  Dose: 1.2 mg Route: SubCUTAneous Frequency: DAILY   Dispense Quantity: 2 Adjustable Dose Pre-filled Pen Syringe Refills: 1          Sig: Inject 1.2 mg into the skin daily   Christin is telling her she needs a new script because she's not due yet    Pt says her dosage was increased so she finished the script early    She is completely out and her sugar has been high

## 2021-06-21 NOTE — Telephone Encounter (Signed)
 New script sent

## 2021-06-22 DIAGNOSIS — S61012A Laceration without foreign body of left thumb without damage to nail, initial encounter: Secondary | ICD-10-CM

## 2021-06-22 NOTE — Unmapped (Signed)
ED Attending Attestation Note    Date of service:  06/22/2021    This patient was seen by the APP .  I have seen and examined the patient, agree with the workup, evaluation, management and diagnosis. The care plan has been discussed and I concur.      My assessment reveals a 36 y.o. female presented to the emergency department for evaluation of laceration of hand.  The patient sustained laceration of extensor surface of hand she had applied a bandage to it and her injury occurred 24 hours ago the patient wound is out of the window of laceration repair and should be recommended wound care and wound healing by secondary intention.  The patient appears clinically well for discharge home will be discharged for outpatient follow-up care for wound care`

## 2021-06-22 NOTE — Unmapped (Signed)
Left hand laceration was cleaned and irrigated with saline. Pt tolerated well. Non stick dressing with kerlix applied. Discussed s/s of infection. Pt expressed understanding

## 2021-06-22 NOTE — Unmapped (Signed)
Pt ambulates into triage stating I may need stitches. Pt has lac on left hand. Pt cut hand on paper trimmer, last evening approx 11pm. Pt's last tetanus shot is unknown.

## 2021-06-22 NOTE — Unmapped (Signed)
[facility] Emergency Department Note    Reason for Visit: Laceration    Patient History     HPI: Maria Stevenson is a 36 y.o. female with a past medical history of asthma, diabetes, hypothyroidism who presents emergency department with a chief complaint of laceration.  The patient was crafting last night and sustained a laceration to the left extensor surface of the thumb.  This occurred nearly 24 hours ago and patient at the time placed a bandage over the area.  She confirms that her Tdap is up-to-date.  She denies any other injuries.  She denies any numbness or tingling.     With the exception of the above, there are no aggravating or alleviating factors.    Medical History:   Past Medical History:   Diagnosis Date   ??? Asthma    ??? Dermatitis    ??? DM (diabetes mellitus), type 2 (CMS-HCC)    ??? Hypothyroid    ??? Varicella      Surgical History:   Past Surgical History:   Procedure Laterality Date   ??? CYST REMOVAL     ??? HYSTEROSCOPY N/A 11/17/2018    Procedure: Hysteroscopic resection of fibroid;  Surgeon: Ali Lowe, MD;  Location: [facility] OR;  Service: Gynecology;  Laterality: N/A;   ??? WISDOM TOOTH EXTRACTION       Medications: No current facility-administered medications for this encounter.    Current Outpatient Medications:   ???  albuterol (ACCUNEB) 0.63 mg/3 mL nebulizer solution, Inhale 1 ampule by nebulization every 6 hours as needed., Disp: , Rfl:   ???  ergocalciferol, vitamin D2, (VITAMIN D ORAL), Take by mouth., Disp: , Rfl:   ???  ferrous sulfate/vit C/folic ac (FERROUS SULFATE-C-FOLIC ACID ORAL), Take by mouth., Disp: , Rfl:   ???  hydrocortisone 1 % cream, Apply topically 2 times a day., Disp: , Rfl:   ???  ibuprofen (MOTRIN) 600 MG tablet, Take 1 tablet (600 mg total) by mouth every 6 hours as needed for Pain., Disp: 30 tablet, Rfl: 0  ???  insulin degludec/liraglutide (XULTOPHY 100/3.6 SUBQ), Inject 20 Units subcutaneously., Disp: , Rfl:   ???  levothyroxine  (SYNTHROID) 75 MCG tablet, Take 1 tablet (75 mcg total) by mouth every morning before breakfast., Disp: 90 tablet, Rfl: 3  ???  levothyroxine (SYNTHROID, LEVOTHROID) 50 MCG tablet, Take 1 tablet (50 mcg total) by mouth daily., Disp: , Rfl:   ???  lidocaine (XYLOCAINE) 5 % ointment, Apply topically as needed., Disp: , Rfl:   ???  losartan (COZAAR) 25 MG tablet, Take 1 tablet (25 mg total) by mouth daily., Disp: , Rfl:   ???  metFORMIN (GLUCOPHAGE) 500 MG tablet, Take 2 tablets (1,000 mg total) by mouth 2 times a day with meals., Disp: , Rfl:   ???  montelukast (SINGULAIR) 10 mg tablet, Take 1 tablet (10 mg total) by mouth at bedtime., Disp: , Rfl:   ???  multivit-min/ferrous fumarate (MULTI VITAMIN ORAL), Take by mouth., Disp: , Rfl:   ???  simvastatin (ZOCOR) 40 MG tablet, Take 1 tablet (40 mg total) by mouth at bedtime., Disp: , Rfl:   ???  valACYclovir (VALTREX) 500 MG tablet, Take 1 tablet (500 mg total) by mouth 2 times a day., Disp: , Rfl:   Social History: Maria Stevenson  reports that she has never smoked. She has never used smokeless tobacco. She reports that she does not drink alcohol and does not use drugs.  Allergies:   Allergies as of 06/22/2021 -  Fully Reviewed 06/22/2021   Allergen Reaction Noted   ??? Semaglutide Hives 05/22/2017   ??? Lisinopril  02/03/2018   ??? Peanut Swelling 03/12/2011      Review of Systems     Review of Systems   Constitutional: Negative for chills, fever and malaise/fatigue.   HENT: Negative.    Eyes: Negative for blurred vision, double vision and pain.   Respiratory: Negative for cough and shortness of breath.    Cardiovascular: Negative for chest pain and leg swelling.   Gastrointestinal: Negative for abdominal pain, diarrhea, nausea and vomiting.   Genitourinary: Negative for dysuria.   Musculoskeletal: Negative for myalgias.   Skin: Negative for rash.        Wound present.   Neurological: Negative for headaches.   Psychiatric/Behavioral: Negative.         Physical Exam     Vitals:    06/22/21  2016   BP: 154/88   Pulse: 94   Resp: 16   Temp: 98.5 ??F (36.9 ??C)   SpO2: 100%   Weight: 196 lb (88.9 kg)   Height: 5' 4 (1.626 m)       Physical Exam  Vitals and nursing note reviewed.   Constitutional:       General: She is not in acute distress.     Appearance: Normal appearance. She is well-developed. She is not ill-appearing.   HENT:      Head: Normocephalic and atraumatic.      Nose: Nose normal.      Mouth/Throat:      Mouth: Mucous membranes are moist.      Pharynx: Oropharynx is clear.   Eyes:      Extraocular Movements: Extraocular movements intact.      Pupils: Pupils are equal, round, and reactive to light.   Cardiovascular:      Rate and Rhythm: Normal rate and regular rhythm.      Pulses: Normal pulses.   Pulmonary:      Effort: Pulmonary effort is normal.      Breath sounds: Normal breath sounds.   Abdominal:      General: Abdomen is flat.      Palpations: Abdomen is soft.   Musculoskeletal:         General: Signs of injury present. Normal range of motion.      Cervical back: Normal range of motion and neck supple.   Skin:     General: Skin is warm and dry.      Capillary Refill: Capillary refill takes less than 2 seconds.      Comments: There is approximately a 3 cm linear laceration to the extensor surface of the left thumb.  Patient has intact sensation throughout the entirety of the thumb and left hand.  Patient is able to make a tight fist, completely flex and extend her left thumb without any difficulty.    Neurological:      General: No focal deficit present.      Mental Status: She is alert and oriented to person, place, and time.   Psychiatric:         Mood and Affect: Mood normal.         Behavior: Behavior normal.          Diagnostic Studies     Labs: Please see electronic medical record for any tests performed in the ED   Labs Reviewed - No data to display  IMAGING STUDIES / RADIOLOGY: Please see electronic medical record for any  tests performed in the ED  No orders to display      Emergency Department Procedures     Procedures  None    ED Course     ED Medications Administered:  Medications - No data to display    CONSULTS:  None    MDM     Briefly, Maria Stevenson is a 37 y.o. female who presents to the emergency department with left thumb laceration.    Patient presents with reassuring vital signs.  She has a 3 cm linear laceration to the extensor surface of the left thumb which has been present for nearly 24 hours.  She is out of the window for laceration repair and we will allow to heal by secondary intention.  The wound was cleaned out extensively and redressed.  The patient confirmed that her Tdap is up-to-date.  I have discussed return precautions with the patient.  She will return if she notices any redness, pus, difficulty moving the left thumb.  At this point in time I feel that this is a simple laceration without any signs of tendon injury.  She has great range of motion of the left thumb with extension and flexion and is able to make a tight fist.  The left thumb is neurovascularly intact.  Again the patient's wound was cared for and patient was discharged in stable condition.    Medical Decision Making  Thumb laceration, left, initial encounter: acute illness or injury  Risk  OTC drugs.        At this point in time, patient is stable for discharge. Patient was given strict return precautions as outlined in the AVS. Patient was agreeable and understanding to this plan of care. Prior to discharge, patient was ambulatory and PO tolerant.    The patient was evaluated by myself and the ED Attending Physician, Dr. Daleen Squibb. All management and disposition plans were discussed and agreed upon.   Clinical Impression     1. Thumb laceration, left, initial encounter      Disposition     Discharged from the ED. See AVS for prescriptions, followup, and discharge instructions.     ---------------------------------------------    Modesta Messing, PA-C  Department of Emergency Medicine        Modesta Messing, [place]  06/23/21 [id number]

## 2021-06-22 NOTE — Unmapped (Addendum)
You are seen with a laceration that has exceeded 24 hours of being present.  Therefore we will let this wound heal by secondary intention.  Please place antibiotic ointment on the wound and cover the wound as you were.  This will take some time to heal.  Cover with antibiotic ointment and a bandage. If the bandage becomes wet or dirty, change it.    Return sooner for any redness around the suture site, swelling, pus, warmth or fever.  May take Tylenol or Motrin over-the-counter as needed for pain if allowed.

## 2021-06-22 NOTE — Unmapped (Signed)
Discussed discharge instructions with pt as well as follow up and wound care. Pt expressed understanding and is without  questions at this time. Pt ambulatory to exit with steady gate

## 2021-06-23 ENCOUNTER — Inpatient Hospital Stay
Admit: 2021-06-23 | Discharge: 2021-06-23 | Disposition: A | Discharge status: Home / Self Care | Payer: PRIVATE HEALTH INSURANCE

## 2021-06-25 NOTE — Telephone Encounter (Signed)
 LOV: 06/07/2021  FOV: 07/05/2021

## 2021-06-26 MED ORDER — LOSARTAN POTASSIUM 25 MG PO TABS
25 | ORAL_TABLET | ORAL | 1 refills | Status: DC
Start: 2021-06-26 — End: 2021-07-05

## 2021-06-26 MED ORDER — METFORMIN HCL 500 MG PO TABS
500 | ORAL_TABLET | ORAL | 1 refills | Status: DC
Start: 2021-06-26 — End: 2022-04-23

## 2021-06-26 MED ORDER — D3 50 MCG (2000 UT) PO TABS
50 | ORAL_TABLET | ORAL | 3 refills | 30.00000 days | Status: DC
Start: 2021-06-26 — End: 2022-03-12

## 2021-06-26 NOTE — Telephone Encounter (Signed)
 Pt lost medication, needs new one a different dosage for insurance to cover

## 2021-07-05 ENCOUNTER — Ambulatory Visit: Admit: 2021-07-05 | Payer: PRIVATE HEALTH INSURANCE | Attending: Oncology | Primary: Oncology

## 2021-07-05 DIAGNOSIS — Z794 Long term (current) use of insulin: Secondary | ICD-10-CM

## 2021-07-05 MED ORDER — LOSARTAN POTASSIUM 25 MG PO TABS
25 | ORAL_TABLET | ORAL | 1 refills | Status: DC
Start: 2021-07-05 — End: 2024-04-20

## 2021-07-05 NOTE — Patient Instructions (Signed)
[email or web address]

## 2021-07-06 NOTE — Progress Notes (Signed)
 Shelly Richard (DOB:  12-Jun-1985) is a 36 y.o. female,Established patient, here for evaluation of the following chief complaint(s):  Follow-up      ASSESSMENT/PLAN:  1. Type 2 diabetes mellitus without complication, with long-term current use of insulin  [facility])  Assessment & Plan:  Admits to poor diet over the last month  Doing well with medications  We will focus on diet over the next 4 weeks and follow-up      No follow-ups on file.    SUBJECTIVE/OBJECTIVE:  HPI  Patient is here to follow-up on her diabetes.  She has been doing well with her medications and reports compliance.  She does admit that she had traveled a few times within the last month and did not eat well on these days.  Blood sugars running anywhere from high 100s to low 300s.  She does note her morning blood sugars are better than they were in the prior month.  Denies hypoglycemia.    Current Outpatient Medications   Medication Sig Dispense Refill    losartan  (COZAAR ) 25 MG tablet TAKE ONE TABLET BY MOUTH DAILY 90 tablet 1    metFORMIN  (GLUCOPHAGE ) 500 MG tablet TAKE TWO TABLETS BY MOUTH TWICE A DAY WITH MEALS 270 tablet 1    Cholecalciferol (D3) 50 MCG (2000 UT) TABS TAKE ONE TABLET BY MOUTH DAILY 30 tablet 3    Liraglutide  (VICTOZA ) 18 MG/3ML SOPN SC injection Inject 1.2 mg into the skin daily 2 Adjustable Dose Pre-filled Pen Syringe 1    Insulin  Pen Needle 32G X 6 MM MISC Use four times daily and as needed 100 each 5    Insulin  Pen Needle 32G X 6 MM MISC Once daily 90 each 2    insulin  lispro, 1 Unit Dial , (HUMALOG  KWIKPEN) 100 UNIT/ML SOPN Inject 2 units for BS 150-200, 4 units for BS 201-250, 6 units for BS 251-300, 8 units for BS 301-350, 10 units for BS greater than 350 with meals 4 Adjustable Dose Pre-filled Pen Syringe 2    simvastatin  (ZOCOR ) 40 MG tablet TAKE ONE TABLET BY MOUTH DAILY 90 tablet 1    levothyroxine  (SYNTHROID ) 75 MCG tablet TAKE ONE TABLET BY MOUTH DAILY 90 tablet 1    FARXIGA  10 MG tablet TAKE ONE TABLET BY MOUTH EVERY  MORNING 90 tablet 1    insulin  detemir (LEVEMIR  FLEXTOUCH) 100 UNIT/ML injection pen Inject 20 Units into the skin nightly 5 Adjustable Dose Pre-filled Pen Syringe 3    Continuous Blood Gluc Sensor (FREESTYLE LIBRE 2 SENSOR) MISC Use as directed 6 each 1    Continuous Blood Gluc Sensor (FREESTYLE LIBRE 3 SENSOR) MISC Check glucose per diagnosis E11.9 6 each 3    EPINEPHrine  (EPIPEN ) 0.3 MG/0.3ML SOAJ injection       ferrous sulfate  (IRON  325) 325 (65 Fe) MG tablet Take 1 tablet by mouth daily (with breakfast) 90 tablet 2    blood glucose monitor strips Tes2t qam  times a day & as needed for symptoms of irregular blood glucose. Please provide test strips approved by insurance. 100 strip 2    montelukast  (SINGULAIR ) 10 MG tablet TAKE ONE TABLET BY MOUTH DAILY 30 tablet 1    albuterol  sulfate HFA 108 (90 Base) MCG/ACT inhaler Inhale 2 puffs into the lungs every 6 hours as needed for Wheezing or Shortness of Breath 1 Inhaler 5    cetirizine  (ZYRTEC ) 10 MG tablet TAKE ONE TABLET BY MOUTH DAILY 30 tablet 1    Insulin  Pen Needle ([employer] PEN NEEDLES  31G) 31G X 8 MM MISC 1 each by Does not apply route daily 100 each 3    Insulin  Pen Needle ([employer] PEN NEEDLES 29G) 29G X MISC 1 each by Does not apply route daily 100 each 3    valACYclovir  (VALTREX ) 500 MG tablet TAKE 1 TABLET BY MOUTH DAILY 90 tablet 5    Lancets MISC 1 each by Does not apply route daily Please provide lancets approved by insurance. 100 each 3    Blood Glucose Monitoring Suppl (ACURA BLOOD GLUCOSE METER) w/Device KIT Please provide meter approved by insurance.  Check bs qam 1 kit 0    Lactobacillus (ACIDOPHILUS) TABS TAKE 1 TABLET BY MOUTH EVERY DAY 30 tablet 5    ibuprofen  (ADVIL ;MOTRIN ) 600 MG tablet Take 1 tablet by mouth every 6 hours as needed for Pain 30 tablet 2    letrozole (FEMARA) 2.5 MG tablet       norethindrone (AYGESTIN) 5 MG tablet       rizatriptan  (MAXALT ) 10 MG tablet Take 1 tablet by mouth once as needed for Migraine May repeat in 2  hours if needed 30 tablet 3     No current facility-administered medications for this visit.       Review of Systems   Constitutional:  Negative for chills, fatigue and fever.   HENT:  Negative for congestion, sinus pressure and sinus pain.    Respiratory:  Negative for cough, shortness of breath and wheezing.    Cardiovascular:  Negative for chest pain and palpitations.   Gastrointestinal:  Negative for abdominal pain, constipation and diarrhea.   Musculoskeletal:  Negative for arthralgias, back pain and myalgias.   Skin:  Negative for color change, pallor and rash.   Neurological:  Negative for dizziness, syncope, weakness, light-headedness and headaches.   Psychiatric/Behavioral:  Negative for behavioral problems, confusion and sleep disturbance. The patient is not nervous/anxious.      Vitals:    07/05/21 1606   BP: 112/78   Site: Right Upper Arm   Position: Sitting   Cuff Size: Large Adult   Pulse: 100   Temp: 98.3 F (36.8 C)   SpO2: 98%   Weight: 195 lb (88.5 kg)   Height: 5' 4 (1.626 m)       Physical Exam  Constitutional:       Appearance: Normal appearance.   HENT:      Head: Normocephalic and atraumatic.   Eyes:      Extraocular Movements: Extraocular movements intact.   Pulmonary:      Effort: Pulmonary effort is normal.   Musculoskeletal:      Cervical back: Normal range of motion.   Skin:     Coloration: Skin is not jaundiced or pale.   Neurological:      General: No focal deficit present.      Mental Status: She is alert and oriented to person, place, and time.   Psychiatric:         Mood and Affect: Mood normal.         Behavior: Behavior normal.         Thought Content: Thought content normal.               An electronic signature was used to authenticate this note.    --[name], [name]

## 2021-07-06 NOTE — Assessment & Plan Note (Signed)
 Admits to poor diet over the last month  Doing well with medications  We will focus on diet over the next 4 weeks and follow-up

## 2021-07-11 ENCOUNTER — Ambulatory Visit: Payer: PRIVATE HEALTH INSURANCE | Attending: Reproductive Endocrinology | Primary: Oncology

## 2021-07-11 NOTE — Unmapped (Signed)
Patient schedule for pre-op today with surgery scheduled for 4/5.      Per previous telephone notes, pt requesting to reschedule surgery for June.    Called pt to determine plan - no answer, left VM to return call.

## 2021-07-11 NOTE — Unmapped (Deleted)
CC: Pre-operative H&P    HPI: Maria Stevenson 36 y.o. [id number] with Type I fibroid (57.5 x 62.6 mm, noted to protrude into uterine cavity) seen on [facility] on 05/15/21 presents for H&P prior to a hysteroscopic myomectomy scheduled on ***07/18/21.      LMP: No LMP recorded.    BCM: ***    Pertinent History:     Past Medical History:   Diagnosis Date   ??? Asthma    ??? Dermatitis    ??? DM (diabetes mellitus), type 2 (CMS-HCC)    ??? Hypothyroid    ??? Varicella        Past Surgical History:   Procedure Laterality Date   ??? CYST REMOVAL     ??? HYSTEROSCOPY N/A 11/17/2018    Procedure: Hysteroscopic resection of fibroid;  Surgeon: Ali Lowe, MD;  Location: [facility] OR;  Service: Gynecology;  Laterality: N/A;   ??? WISDOM TOOTH EXTRACTION         Current Outpatient Medications on File Prior to Visit   Medication Sig Dispense Refill   ??? albuterol (ACCUNEB) 0.63 mg/3 mL nebulizer solution Inhale 1 ampule by nebulization every 6 hours as needed.     ??? ergocalciferol, vitamin D2, (VITAMIN D ORAL) Take by mouth.     ??? ferrous sulfate/vit C/folic ac (FERROUS SULFATE-C-FOLIC ACID ORAL) Take by mouth.     ??? hydrocortisone 1 % cream Apply topically 2 times a day.     ??? ibuprofen (MOTRIN) 600 MG tablet Take 1 tablet (600 mg total) by mouth every 6 hours as needed for Pain. 30 tablet 0   ??? insulin degludec/liraglutide (XULTOPHY 100/3.6 SUBQ) Inject 20 Units subcutaneously.     ??? levothyroxine (SYNTHROID) 75 MCG tablet Take 1 tablet (75 mcg total) by mouth every morning before breakfast. 90 tablet 3   ??? levothyroxine (SYNTHROID, LEVOTHROID) 50 MCG tablet Take 1 tablet (50 mcg total) by mouth daily.     ??? lidocaine (XYLOCAINE) 5 % ointment Apply topically as needed.     ??? losartan (COZAAR) 25 MG tablet Take 1 tablet (25 mg total) by mouth daily.     ??? metFORMIN (GLUCOPHAGE) 500 MG tablet Take 2 tablets (1,000 mg total) by mouth 2 times a day with meals.     ??? montelukast (SINGULAIR) 10 mg tablet Take 1 tablet (10 mg total) by mouth at bedtime.     ???  multivit-min/ferrous fumarate (MULTI VITAMIN ORAL) Take by mouth.     ??? simvastatin (ZOCOR) 40 MG tablet Take 1 tablet (40 mg total) by mouth at bedtime.     ??? valACYclovir (VALTREX) 500 MG tablet Take 1 tablet (500 mg total) by mouth 2 times a day.       No current facility-administered medications on file prior to visit.       Allergies   Allergen Reactions   ??? Semaglutide Hives   ??? Lisinopril    ??? Peanut Swelling     Throat and tongue itch, angioedema of face  Throat and tongue itch, angioedema of face         Social History     Socioeconomic History   ??? Marital status: Single     Spouse name: Not on file   ??? Number of children: Not on file   ??? Years of education: Not on file   ??? Highest education level: Not on file   Occupational History   ??? Not on file   Tobacco Use   ??? Smoking status: Never   ???  Smokeless tobacco: Never   Substance and Sexual Activity   ??? Alcohol use: No   ??? Drug use: Never   ??? Sexual activity: Yes     Partners: Male   Other Topics Concern   ??? Caffeine Use Yes   ??? Occupational Exposure Not Asked   ??? Exercise No   ??? Seat Belt Yes   Social History Narrative   ??? Not on file     Social Determinants of Health     Financial Resource Strain: Not on file   Physical Activity: Not on file   Stress: Not on file   Social Connections: Not on file   Housing Stability: Not on file       OB History   Gravida Para Term Preterm AB Living   2 2 2     2    SAB IAB Ectopic Multiple Live Births           2      # Outcome Date GA Lbr Len/2nd Weight Sex Delivery Anes PTL Lv   2 Term 06/30/07 [redacted]w[redacted]d  3162 g (6 lb 15.5 oz) F Vag-Spont   LIV   1 Term 2005     Vag-Spont   LIV       Family History   Problem Relation Age of Onset   ??? Diabetes Mother    ??? Diabetes Father    ??? Melanoma Neg Hx        There were no vitals filed for this visit.    Physical Exam:   Gen: NAD, A&Ox4  HEENT: PERRLA, EOMI  Caridac: RRR no m/r/g  Resp: CTAB no w/r/r  Abd: NT, ND, No R/G  Skin: No rashes  GU: Deferred to OR   Neuro: Ambulatory, CN 2-12  intact.     Lab Review:     Lab Results   Component Value Date    ABOGROUP B 05/01/2021    RH Positive 05/01/2021    ABS Negative 05/01/2021    HGB 10.6 (L) 05/01/2021    HCT 36.3 05/01/2021    CREATININE 0.66 05/01/2021    POCTAMH 2.60 05/02/2021    POCTPRLCTIN 11.75 05/02/2021    POCTSH 2.81 05/02/2021    HGBA1C 10.7 (H) 05/01/2021     Lab Results   Component Value Date    RUBELLAIGG 7.30 05/01/2021    VARICELLAIGG 983 05/01/2021    HEPBSAG Negative 05/01/2021    HCVAB 0.2 05/01/2021    RPR Non Reactive 05/01/2021       Assessment:   36 y.o. [id number]   Planned surgery: Hysteroscopic myomectomy with Myosure  Date of surgery: ***April 5    Plan:   ***Has completed preop clearance with PCP  Most recent A1c now 6.7 (06/07/21, in Care Everywhere)  Risks of surgery discussed including internal organ damage (bowel, blood vessel, bladder, nerves); need for blood transfusion (risk of hiv/hepatitis reviewed); infection, need for other surgery to repair any injuries, longer hospital stay.   NPO reviewed  IFC obtained                Time Spent With Patient:   I spent a total of *** minutes on patient visit and >= 50% of time was spent face to face, discussing the problem and treatment plan.

## 2021-07-11 NOTE — Unmapped (Signed)
Pt's pre-op visit no longer on schedule today.  Assuming patient would like to plan for surgery in June and cancel surgery 4/5.

## 2021-07-12 DIAGNOSIS — M3322 Polymyositis with myopathy: Secondary | ICD-10-CM | POA: Diagnosis not present

## 2021-07-12 DIAGNOSIS — J069 Acute upper respiratory infection, unspecified: Secondary | ICD-10-CM | POA: Diagnosis not present

## 2021-07-12 DIAGNOSIS — R059 Cough, unspecified: Secondary | ICD-10-CM | POA: Diagnosis not present

## 2021-07-13 ENCOUNTER — Ambulatory Visit: Admit: 2021-07-13 | Payer: PRIVATE HEALTH INSURANCE | Primary: Oncology

## 2021-07-13 DIAGNOSIS — L739 Follicular disorder, unspecified: Secondary | ICD-10-CM

## 2021-07-13 DIAGNOSIS — M3322 Polymyositis with myopathy: Secondary | ICD-10-CM | POA: Diagnosis not present

## 2021-07-13 MED ORDER — chlorhexidine (HIBICLENS) 4 % external liquid
4 | TOPICAL | 5 refills | Status: AC
Start: 2021-07-13 — End: 2021-09-14

## 2021-07-13 NOTE — Unmapped (Signed)
LEGEND PECORE is a 36 y.o. Black or African American female new patient    Referred by  Self Referral  No address on file    CC:   Chief Complaint   Patient presents with   ??? Skin Problem     Pt here for bumps to body     HPI:    1.) Several month history of painful bumps on trunk and extremities. Appear randomly, usually last 1 week before they heal. She has diabetes and since being more consistent on her medication has noticed decreased frequency. Was prescribed clindamycin 1% gel but has not started it yet. Today only one healing lesion on right upper arm, but has pictures of pustules/nodules on trunk and extremities.     Other derm history:  - Hidradenitis suppurativa. Prior treatment excision, doxycycline, benzoyl peroxide 5% wash    ADDITIONAL HISTORY:  I have reviewed past medical, social and surgical history, medications and allergies as documented in the patient's electronic medical record.    ROS:   Constitutional: Denies fever, chills    PHYSICAL EXAM:  The patient is alert, awake and oriented x 3, well-nourished, good humored and in no apparent distress.  Examination was performed of the following: conjunctivae/eyelids, gums/teeth/lips, neck, breast/axilla/chest, abdomen, back, right upper extremity, left upper extremity, right lower extremity and left lower extremity  1.) Single excoriated papule on right upper arm.     ASSESSMENT AND PLAN:  1.) Likely bacterial folliculitis or prurigo related to hyperglycemia. Improving spontaneously with glucose control  -     chlorhexidine (HIBICLENS) 4 % external liquid; Apply topically once a week. As body wash from neck down. Don't use on head, face area. Avoid contact with eyes and ears. Attempt 6 months and if no recurrence stop  - clindamycin 1% gel twice daily as needed.      The patient indicates understanding of these issues and agrees with the plan.    Follow-up as needed  Waynette Buttery M.D.  07/13/2021

## 2021-07-16 DIAGNOSIS — H9201 Otalgia, right ear: Secondary | ICD-10-CM | POA: Diagnosis not present

## 2021-07-16 DIAGNOSIS — B379 Candidiasis, unspecified: Secondary | ICD-10-CM | POA: Diagnosis not present

## 2021-07-16 DIAGNOSIS — T3695XA Adverse effect of unspecified systemic antibiotic, initial encounter: Secondary | ICD-10-CM | POA: Diagnosis not present

## 2021-07-18 NOTE — Unmapped (Signed)
PreOp scheduled for 5/12. Patient also states that LMP was 07/08/21 and she has had light bleeding every day since. She is currently taking all of her medications

## 2021-07-18 NOTE — Unmapped (Signed)
Surgery will be tentatively scheduled for June 8th. Please schedule for pre-op in May.  Thanks!

## 2021-07-18 NOTE — Unmapped (Signed)
Pt is requesting to schedule a pre op , please advise

## 2021-07-18 NOTE — Unmapped (Signed)
Pt calling to schedule surgery for June. Pt was told to call first week of April to get scheduled. I advised pt once surgery is scheduled we will schedule pre op 1-2 wks before surgery.

## 2021-07-19 NOTE — Unmapped (Signed)
Called pt to inform her about Dr. Aundria RudHurley's message. Pt states that she doesn't have bleeding at the moment. It is kind of random and comes and goes. Pt states she will monitor bleeding over the weekend and if she still feels that it is persistent or is concerned, she will call Monday to schedule an appt with Dr. Doreene AdasHurley.

## 2021-07-23 DIAGNOSIS — G8929 Other chronic pain: Secondary | ICD-10-CM | POA: Diagnosis not present

## 2021-07-23 DIAGNOSIS — M1711 Unilateral primary osteoarthritis, right knee: Secondary | ICD-10-CM | POA: Diagnosis not present

## 2021-07-23 DIAGNOSIS — M329 Systemic lupus erythematosus, unspecified: Secondary | ICD-10-CM | POA: Diagnosis not present

## 2021-07-23 DIAGNOSIS — M2550 Pain in unspecified joint: Secondary | ICD-10-CM | POA: Diagnosis not present

## 2021-07-23 DIAGNOSIS — E538 Deficiency of other specified B group vitamins: Secondary | ICD-10-CM | POA: Diagnosis not present

## 2021-07-23 DIAGNOSIS — M25461 Effusion, right knee: Secondary | ICD-10-CM | POA: Diagnosis not present

## 2021-07-23 DIAGNOSIS — J849 Interstitial pulmonary disease, unspecified: Secondary | ICD-10-CM | POA: Diagnosis not present

## 2021-07-23 DIAGNOSIS — M25561 Pain in right knee: Secondary | ICD-10-CM | POA: Diagnosis not present

## 2021-07-23 DIAGNOSIS — Z79899 Other long term (current) drug therapy: Secondary | ICD-10-CM | POA: Diagnosis not present

## 2021-07-24 MED ORDER — FEROSUL 325 (65 FE) MG PO TABS
325 | ORAL_TABLET | ORAL | 2 refills | Status: DC
Start: 2021-07-24 — End: 2022-08-06

## 2021-07-24 NOTE — Telephone Encounter (Signed)
LOV 07/05/21  FOV 08/16/21

## 2021-07-25 MED ORDER — lidocaine (PF) 2% (20 mg/mL) Soln 20 mg
20 | Freq: Once | INTRAMUSCULAR | Status: AC | PRN
Start: 2021-07-25 — End: 2021-07-25

## 2021-07-25 NOTE — Unmapped (Signed)
Surgery/Procedure Scheduling Form    Maria Stevenson   [address]  [place] [address]     Date of Birth:  July 16, 1985  MRN:  [id number]   Sex:  female  Phone Number:  There are no phone numbers on file.      PROCEDURE INFORMATION    Procedure Date:  09/20/21 Alternate Date:      Surgery Time:  1:30 PM  Length of Surgery:  1 hour(s)    PROCEDURE:  Hysteroscopic dilation and curettage, possible polypectomy/myomectomy    DIAGNOSIS:  Abnormal uterine bleeding - leiomyoma     Size/Depth:    Anatomical Location:      Surgeon:  Doristine Church MD   Co-Surgeon:      Assistant:     Resident:      Lysle Morales    Facility:  [facility] Type:  Private    Patient will go to:  PACU    Admit Status:  Outpatient    ANESTHESIA INFORMATION    Anesthesia Type:  Choice    Post-Op Epidural:  No    TAP Blocks:  No    Position:  Lithotomy    Special Equipment/Supplies Needed:  Myosure     Allergies   Allergen Reactions   ??? Semaglutide Hives   ??? Lisinopril    ??? Peanut Swelling     Throat and tongue itch, angioedema of face  Throat and tongue itch, angioedema of face         Latex Sensitive:  No    PRE-ADMISSION TESTING    Patient does not need PAT appointment.    Pre-Admission Testing done at:    Date:    Time:    H&P Appointment at:    Date:   Time:      INSURANCE INFORMATION      PRIMARY INSURANCE   Payor: @RFLCVGPAYOR @  Plan: @RFLCVGPLAN @    Group Number: @[id number] @  Insurance Type:    Subscriber Name: @SUBSCRIBERNAME @  Subscriber DOB:     Subscriber ID: @[id number] @  Pat. Rel. to Subscriber:    SECONDARY INSURANCE   Payor:  Plan:    Group Number:  Insurance Type:    Subscriber Name:  Subscriber DOB:    Subscriber ID: @SUBIDSECONDARY @  Pat. Rel. to Subscriber:

## 2021-07-27 NOTE — Telephone Encounter (Signed)
From: Sharla Kidney  To: Herbert Deaner  Sent: 07/27/2021 7:36 AM EDT  Subject: Blood Sugar    Good morning,   For the past few mornings, I have been woken up out of my sleep around between the hours of 1 AM and 3 AM because of my glucose monitor telling me my sugar has dropped between 55 and 57. When I get up around 6:20 AM and 6:30 AM my sugar has been reading over 200 and by the time I leave the house at 7 AM. My sugar is over 250 and that's medication only nothing to eat. Last night I didn't eat super late since my meds went to bed. At this time I do not know what is causing my sugar to jump like that. When you get a chance, please call me.

## 2021-08-02 MED ORDER — ERYTHROMYCIN 5 MG/GM OP OINT
5 | OPHTHALMIC | 0 refills | 30.00000 days | Status: DC
Start: 2021-08-02 — End: 2022-01-22

## 2021-08-07 DIAGNOSIS — M329 Systemic lupus erythematosus, unspecified: Secondary | ICD-10-CM | POA: Diagnosis not present

## 2021-08-07 MED ORDER — MOUNJARO 2.5 MG/0.5ML SC SOPN
2.5 | SUBCUTANEOUS | 0 refills | Status: DC
Start: 2021-08-07 — End: 2021-09-11

## 2021-08-07 NOTE — Telephone Encounter (Addendum)
 Submitted PA for Mounjaro  2.5MG /0.5ML pen-injectors  Via CMM Key: [id number] STATUS: DENIED; Denial letter attached.    If this requires a response please respond to the pool ( P MHCX PSC MEDICATION PRE-AUTH).      Thank you please advise patient.

## 2021-08-08 NOTE — Unmapped (Signed)
Will keep surgery scheduled for 6/8.

## 2021-08-16 ENCOUNTER — Ambulatory Visit: Admit: 2021-08-16 | Discharge: 2021-08-16 | Payer: PRIVATE HEALTH INSURANCE | Attending: Oncology | Primary: Oncology

## 2021-08-16 DIAGNOSIS — Z111 Encounter for screening for respiratory tuberculosis: Secondary | ICD-10-CM

## 2021-08-16 NOTE — Patient Instructions (Signed)
Dr. Eugene Gavia  [facility]   [address] [place] [address]  Tel: [phone]

## 2021-08-17 NOTE — Progress Notes (Signed)
Subjective:       Shelly Richard is a 36 y.o. female who presents for follow up of diabetes.. Current symptoms include: none. Patient denies foot ulcerations, nausea, polydipsia, and polyuria. Evaluation to date has been: fasting lipid panel and hemoglobin A1C. Home sugars: BGs are high in the morning, BGs are high in the evening, symptomatic hypoglycemia does not occur. Current treatments: more intensive attention to diet which has been Yes:  , which has been somewhat effective. and Increased dose of insulin which has been effective . Last dilated eye exam: Due.    She has also noticed her left eye seems to be misaligned when she looks at pictures of herself. No changes in vision.     Patient's medications, allergies, past medical, surgical, social and family histories were reviewed and updated as appropriate.    Review of Systems  Review of Systems   Constitutional:  Negative for chills, fatigue and fever.   HENT:  Negative for congestion, sinus pressure and sinus pain.    Eyes:         Lazy eye, left   Respiratory:  Negative for cough, shortness of breath and wheezing.    Cardiovascular:  Negative for chest pain and palpitations.   Gastrointestinal:  Negative for abdominal pain, constipation and diarrhea.   Musculoskeletal:  Negative for arthralgias, back pain and myalgias.   Skin:  Negative for color change, pallor and rash.   Neurological:  Negative for dizziness, syncope, weakness, light-headedness and headaches.   Psychiatric/Behavioral:  Negative for behavioral problems, confusion and sleep disturbance. The patient is not nervous/anxious.       Objective:      Physical Exam  Constitutional:       Appearance: She is well-developed.   HENT:      Head: Normocephalic and atraumatic.   Eyes:      Extraocular Movements:      Right eye: Normal extraocular motion.      Left eye: Normal extraocular motion.      Conjunctiva/sclera: Conjunctivae normal.      Pupils: Pupils are equal, round, and reactive to light.       Comments: Strabismus- right eye    Neck:      Thyroid: No thyromegaly.      Vascular: No JVD.   Cardiovascular:      Rate and Rhythm: Normal rate and regular rhythm.      Heart sounds: Normal heart sounds.   Pulmonary:      Effort: Pulmonary effort is normal. No respiratory distress.      Breath sounds: Normal breath sounds. No wheezing.   Musculoskeletal:         General: Normal range of motion.      Cervical back: Normal range of motion and neck supple.   Skin:     General: Skin is warm and dry.      Capillary Refill: Capillary refill takes less than 2 seconds.   Neurological:      Mental Status: She is alert and oriented to person, place, and time.   Psychiatric:         Behavior: Behavior normal.        Laboratory:  No components found for: A1C      Assessment:   See Plan      Plan:   Strabismus  New finding   Referred to opthalmology     Type 2 diabetes mellitus without complication, with long-term current use of insulin (HCC)  Check a1c  BS running between 140-250 at home   BS higher in the morning, low 100s overnight   Will switch Levemir to AM  Continue SSI humalog   Stopped all oral meds for one week - discussed compliance   Continue to work on diet

## 2021-08-17 NOTE — Assessment & Plan Note (Signed)
Check a1c   BS running between 140-250 at home   BS higher in the morning, low 100s overnight   Will switch Levemir to AM  Continue SSI humalog   Stopped all oral meds for one week - discussed compliance   Continue to work on diet

## 2021-08-17 NOTE — Assessment & Plan Note (Signed)
New finding   Referred to opthalmology

## 2021-08-24 ENCOUNTER — Ambulatory Visit
Admit: 2021-08-24 | Discharge: 2021-08-24 | Payer: PRIVATE HEALTH INSURANCE | Attending: Reproductive Endocrinology | Primary: Oncology

## 2021-08-24 DIAGNOSIS — N859 Noninflammatory disorder of uterus, unspecified: Secondary | ICD-10-CM

## 2021-08-24 NOTE — Unmapped (Signed)
CC: Pre-operative H&P    HPI: Maria Stevenson 36 y.o. [id number] with Type 1 fibroid deeply invading seen on SIS/HyCoSy in Jan 2023 presents for H&P prior to Hysteroscopic removal of fibroid scheduled on June 1.  Pt does prefer to move surgery to June 8.  Is still having bleeding on Aygestin.  LMP 5/6.     LMP: Patient's last menstrual period was 08/18/2021 (exact date).    BCM: Aygestin (did discuss that this is technically not an FDA-approved method of contraception); recommend protected intercourse prior to surgery in addition to continuing the aygestin     Pertinent History:   Cavity/tubal assessment: SIS/HyCoSy (04/2021): Type 1 fibroid deeply invading, bilateral tubal occlusion  Ovarian reserve: AMH 2.6 (04/2021)  Genetic carrier screening: Neg /low risk CF & SMA testing    Past Medical History:   Diagnosis Date   ??? Asthma    ??? Dermatitis    ??? DM (diabetes mellitus), type 2 (CMS-HCC)    ??? Hypothyroid    ??? Varicella        Past Surgical History:   Procedure Laterality Date   ??? CYST REMOVAL     ??? HYSTEROSCOPY N/A 11/17/2018    Procedure: Hysteroscopic resection of fibroid;  Surgeon: Ali Lowe, MD;  Location: [facility] OR;  Service: Gynecology;  Laterality: N/A;   ??? WISDOM TOOTH EXTRACTION         Current Outpatient Medications on File Prior to Visit   Medication Sig Dispense Refill   ??? biotin 10,000 mcg Cap Take 10,000 mcg by mouth daily.     ??? dapagliflozin (FARXIGA) 10 mg Tab tablet Take 1 tablet (10 mg total) by mouth daily.     ??? ergocalciferol, vitamin D2, (VITAMIN D ORAL) Take by mouth.     ??? ferrous sulfate/vit C/folic ac (FERROUS SULFATE-C-FOLIC ACID ORAL) Take 325 mg by mouth daily.     ??? insulin detemir U-100 (LEVEMIR) 100 unit/mL injection Inject subcutaneously at bedtime.     ??? insulin lispro (HUMALOG/ADMELOG) 100 unit/mL injection Inject subcutaneously 3 times a day with meals.     ??? levothyroxine (SYNTHROID) 75 MCG tablet Take 1 tablet (75 mcg total) by mouth every morning before breakfast. 90 tablet 3    ??? liraglutide (VICTOZA) 0.6 mg/0.1 mL (18 mg/3 mL) PnIj Inject 1.2 mg subcutaneously daily.     ??? loratadine (CLARITIN) 10 mg tablet Take 1 tablet (10 mg total) by mouth daily.     ??? metFORMIN (GLUCOPHAGE) 500 MG tablet Take 2 tablets (1,000 mg total) by mouth 2 times a day with meals.     ??? montelukast (SINGULAIR) 10 mg tablet Take 1 tablet (10 mg total) by mouth at bedtime.     ??? norethindrone (AYGESTIN) 5 mg tablet Take 1 tablet (5 mg total) by mouth daily.     ??? simvastatin (ZOCOR) 40 MG tablet Take 1 tablet (40 mg total) by mouth at bedtime.     ??? albuterol (ACCUNEB) 0.63 mg/3 mL nebulizer solution Inhale 1 ampule by nebulization every 6 hours as needed.     ??? chlorhexidine (HIBICLENS) 4 % external liquid Apply topically once a week. As body wash from neck down. Don't use on head, face area. Avoid contact with eyes and ears 236 mL 5   ??? hydrocortisone 1 % cream Apply topically 2 times a day.     ??? ibuprofen (MOTRIN) 600 MG tablet Take 1 tablet (600 mg total) by mouth every 6 hours as needed for Pain. 30 tablet  0   ??? insulin degludec/liraglutide (XULTOPHY 100/3.6 SUBQ) Inject 20 Units subcutaneously.     ??? levothyroxine (SYNTHROID, LEVOTHROID) 50 MCG tablet Take 1 tablet (50 mcg total) by mouth daily.     ??? lidocaine (XYLOCAINE) 5 % ointment Apply topically as needed.     ??? losartan (COZAAR) 25 MG tablet Take 1 tablet (25 mg total) by mouth daily.     ??? multivit-min/ferrous fumarate (MULTI VITAMIN ORAL) Take by mouth.     ??? valACYclovir (VALTREX) 500 MG tablet Take 1 tablet (500 mg total) by mouth 2 times a day.       No current facility-administered medications on file prior to visit.       Allergies   Allergen Reactions   ??? Semaglutide Hives   ??? Lisinopril    ??? Peanut Swelling     Throat and tongue itch, angioedema of face  Throat and tongue itch, angioedema of face         Social History     Socioeconomic History   ??? Marital status: Single     Spouse name: Not on file   ??? Number of children: Not on file   ???  Years of education: Not on file   ??? Highest education level: Not on file   Occupational History   ??? Not on file   Tobacco Use   ??? Smoking status: Never   ??? Smokeless tobacco: Never   Substance and Sexual Activity   ??? Alcohol use: No   ??? Drug use: Never   ??? Sexual activity: Yes     Partners: Male   Other Topics Concern   ??? Caffeine Use Yes   ??? Occupational Exposure Not Asked   ??? Exercise No   ??? Seat Belt Yes   Social History Narrative   ??? Not on file     Social Determinants of Health     Financial Resource Strain: Not on file   Physical Activity: Not on file   Stress: Not on file   Social Connections: Not on file   Housing Stability: Not on file       OB History   Gravida Para Term Preterm AB Living   2 2 2     2    SAB IAB Ectopic Multiple Live Births           2      # Outcome Date GA Lbr Len/2nd Weight Sex Delivery Anes PTL Lv   2 Term 06/30/07 [redacted]w[redacted]d  3162 g (6 lb 15.5 oz) F Vag-Spont   LIV   1 Term 2005     Vag-Spont   LIV       Family History   Problem Relation Age of Onset   ??? Diabetes Mother    ??? Diabetes Father    ??? Melanoma Neg Hx        Vitals:    08/24/21 1128   BP: 118/74   Pulse: 109   Temp: 97.7 ??F (36.5 ??C)   SpO2: 96%       Physical Exam:   Gen: NAD, A&Ox4  HEENT: PERRLA, EOMI  Caridac: RRR no m/r/g  Resp: CTAB no w/r/r  Abd: NT, ND, No R/G  Skin: No rashes  GU: Deferred to OR   Neuro: Ambulatory, CN 2-12 intact.     Lab Review:     Lab Results   Component Value Date    ABOGROUP B 05/01/2021    RH Positive 05/01/2021  ABS Negative 05/01/2021    HGB 10.6 (L) 05/01/2021    HCT 36.3 05/01/2021    CREATININE 0.66 05/01/2021    POCTAMH 2.60 05/02/2021    POCTPRLCTIN 11.75 05/02/2021    POCTSH 2.81 05/02/2021    HGBA1C 10.7 (H) 05/01/2021     Lab Results   Component Value Date    RUBELLAIGG 7.30 05/01/2021    VARICELLAIGG 983 05/01/2021    HEPBSAG Negative 05/01/2021    HCVAB 0.2 05/01/2021    RPR Non Reactive 05/01/2021     In Care Everywhere    Assessment:   36 y.o. [id number]   Planned surgery:  Hysteroscopic dilation and curettage, possible polypectomy/myomectomy  Date of surgery: September 13, 2021 (will attempt to move to September 20, 2021)    Plan:   Has a preop visit scheduled with her PCP at the end of this month to discuss what meds to hold/take prior to surgery and for clearance to proceed with surgery. A1c has improved. Most recent was 6.7% in Feb  Will try to reschedule surgery for June 8.  Recommend back-up forms of contraception for prevention of pregnancy as Aygestin is not an FDA-approved form of birth control; Continue aygestin   Risks of surgery discussed including bleeding, infection, pregnancy disruption, injury to surrounding tissues/organs, need for further procedures, etc.   NPO reviewed  IFC obtained. Has been scanned into media tab    I personally saw, examined, and counseled the patient with the fellow/resident/student and agree with the documented assessment and plan above.    Rolley Sims, MD    Time Spent With Patient:   I spent 15 minutes face to face with this patient with greater than 50% of this time spent in counseling and coordination of care discussing items in the Assessment and Plan above. I also spent 5 minutes on the same day as the encounter performing additional activities related to this service. This time excludes time spent on any separately billed services.

## 2021-08-29 ENCOUNTER — Encounter

## 2021-08-29 ENCOUNTER — Inpatient Hospital Stay: Admit: 2021-08-29 | Payer: PRIVATE HEALTH INSURANCE | Primary: Oncology

## 2021-08-29 ENCOUNTER — Ambulatory Visit: Admit: 2021-08-29 | Discharge: 2021-08-29 | Payer: PRIVATE HEALTH INSURANCE | Attending: Oncology | Primary: Oncology

## 2021-08-29 DIAGNOSIS — R509 Fever, unspecified: Secondary | ICD-10-CM

## 2021-08-29 LAB — POCT INFLUENZA A/B ANTIGEN
Inflenza A Ag: NOT DETECTED
Influenza B Ag: NOT DETECTED

## 2021-08-29 LAB — POCT RAPID STREP A: Strep A Ag: NOT DETECTED

## 2021-08-29 MED ORDER — METHYLPREDNISOLONE 4 MG PO TBPK
4 | PACK | ORAL | 0 refills | Status: AC
Start: 2021-08-29 — End: 2021-09-04

## 2021-08-29 NOTE — Assessment & Plan Note (Signed)
We will start Medrol Dosepak  Given new onset of fever- we will check labs and x-ray

## 2021-08-29 NOTE — Assessment & Plan Note (Signed)
No relief with acetaminophen or NSAIDs  Trial Medrol Dosepak  Check labs  If no improvement will consider imaging.  Patient does have gradual onset of strabismus that was noted at her last visit.  Has not yet seen ophthalmology.

## 2021-08-29 NOTE — Assessment & Plan Note (Addendum)
Unclear etiology  No associated symptoms other than headache and low back pain  Strep and flu negative in office  Home COVID-negative  Check labs  Continue Tylenol/ibuprofen

## 2021-08-29 NOTE — Progress Notes (Signed)
Shelly Richard (DOB:  09/01/1985) is a 36 y.o. female,Established patient, here for evaluation of the following chief complaint(s):  Lower Back Pain (Has been present for ~4 days) and Headache (Has been present for ~3 days, has taken OTC medications, pounding)      ASSESSMENT/PLAN:  1. Fever, unspecified fever cause  Assessment & Plan:  Unclear etiology  No associated symptoms other than headache and low back pain  Strep and flu negative in office  Home COVID-negative  Check labs  Continue Tylenol/ibuprofen  Orders:  -     POCT rapid strep A  -     POCT Influenza A/B Antigen (BD Veritor)  -     CBC with Auto Differential; Future  -     Comprehensive Metabolic Panel; Future  -     C-Reactive Protein; Future  -     Sedimentation Rate; Future  -     XR LUMBAR SPINE (2-3 VIEWS); Future  -     COVID-19; Future  2. Hypothyroidism, unspecified type  -     TSH with Reflex; Future  3. Hyperlipidemia, unspecified hyperlipidemia type  -     Lipid, Fasting; Future  4. Type 2 diabetes mellitus without complication, with long-term current use of insulin (HCC)  -     Hemoglobin A1C; Future  5. Acute midline low back pain without sciatica  Assessment & Plan:  We will start Medrol Dosepak  Given new onset of fever- we will check labs and x-ray  Orders:  -     XR LUMBAR SPINE (2-3 VIEWS); Future  6. Acute intractable tension-type headache  Assessment & Plan:  No relief with acetaminophen or NSAIDs  Trial Medrol Dosepak  Check labs  If no improvement will consider imaging.  Patient does have gradual onset of strabismus that was noted at her last visit.  Has not yet seen ophthalmology.       No follow-ups on file.    SUBJECTIVE/OBJECTIVE:  Back Pain  This is a new problem. The current episode started in the past 7 days. The problem occurs constantly. The problem is unchanged. The pain is present in the lumbar spine. The quality of the pain is described as aching. The pain does not radiate. The symptoms are aggravated by bending.  Stiffness is present All day. Associated symptoms include a fever, headaches and tingling (left leg at times). Pertinent negatives include no abdominal pain, bladder incontinence, chest pain, numbness, paresthesias, pelvic pain or weakness. Risk factors include lack of exercise and sedentary lifestyle. She has tried NSAIDs for the symptoms. The treatment provided no relief.   Fever   This is a new problem. Episode onset: 3 days. The problem occurs intermittently. The problem has been unchanged. The maximum temperature noted was 103 to 103.9 F. Associated symptoms include headaches and muscle aches. Pertinent negatives include no abdominal pain, chest pain, congestion, coughing, diarrhea, nausea, sore throat, vomiting or wheezing. She has tried acetaminophen and NSAIDs for the symptoms. The treatment provided moderate relief.   Risk factors: no recent sickness    Migraine   This is a new problem. The current episode started in the past 7 days. The problem occurs constantly. The problem has been unchanged. The pain is located in the Bilateral region. The pain does not radiate. The pain quality is not similar to prior headaches. The quality of the pain is described as aching and pulsating. The pain is moderate. Associated symptoms include back pain, a fever, muscle aches, photophobia and  tingling (left leg at times). Pertinent negatives include no abdominal pain, coughing, nausea, numbness, rhinorrhea, sinus pressure, sore throat, vomiting or weakness. Associated symptoms comments: Distorted vision x 5 minutes prior to headache pain. The symptoms are aggravated by bright light. She has tried acetaminophen and NSAIDs for the symptoms. The treatment provided mild relief.   Negative home Covid test     Current Outpatient Medications   Medication Sig Dispense Refill    methylPREDNISolone (MEDROL DOSEPACK) 4 MG tablet Take by mouth. 1 kit 0    Tirzepatide (MOUNJARO) 2.5 MG/0.5ML SOPN SC injection Inject 0.5 mLs into the  skin once a week 4 Adjustable Dose Pre-filled Pen Syringe 0    erythromycin (ROMYCIN) 5 MG/GM ophthalmic ointment ~1 cm ribbon into affected eye(s) 4 times daily 3.5 g 0    FEROSUL 325 (65 Fe) MG tablet TAKE ONE TABLET BY MOUTH DAILY WITH BREAKFAST 30 tablet 2    letrozole (FEMARA) 2.5 MG tablet       norethindrone (AYGESTIN) 5 MG tablet       losartan (COZAAR) 25 MG tablet TAKE ONE TABLET BY MOUTH DAILY 90 tablet 1    metFORMIN (GLUCOPHAGE) 500 MG tablet TAKE TWO TABLETS BY MOUTH TWICE A DAY WITH MEALS 270 tablet 1    Cholecalciferol (D3) 50 MCG (2000 UT) TABS TAKE ONE TABLET BY MOUTH DAILY 30 tablet 3    Liraglutide (VICTOZA) 18 MG/3ML SOPN SC injection Inject 1.2 mg into the skin daily 2 Adjustable Dose Pre-filled Pen Syringe 1    Insulin Pen Needle 32G X 6 MM MISC Use four times daily and as needed 100 each 5    Insulin Pen Needle 32G X 6 MM MISC Once daily 90 each 2    insulin lispro, 1 Unit Dial, (HUMALOG KWIKPEN) 100 UNIT/ML SOPN Inject 2 units for BS 150-200, 4 units for BS 201-250, 6 units for BS 251-300, 8 units for BS 301-350, 10 units for BS greater than 350 with meals 4 Adjustable Dose Pre-filled Pen Syringe 2    simvastatin (ZOCOR) 40 MG tablet TAKE ONE TABLET BY MOUTH DAILY 90 tablet 1    levothyroxine (SYNTHROID) 75 MCG tablet TAKE ONE TABLET BY MOUTH DAILY 90 tablet 1    FARXIGA 10 MG tablet TAKE ONE TABLET BY MOUTH EVERY MORNING 90 tablet 1    insulin detemir (LEVEMIR FLEXTOUCH) 100 UNIT/ML injection pen Inject 20 Units into the skin nightly 5 Adjustable Dose Pre-filled Pen Syringe 3    Continuous Blood Gluc Sensor (FREESTYLE LIBRE 2 SENSOR) MISC Use as directed 6 each 1    Continuous Blood Gluc Sensor (FREESTYLE LIBRE 3 SENSOR) MISC Check glucose per diagnosis E11.9 6 each 3    EPINEPHrine (EPIPEN) 0.3 MG/0.3ML SOAJ injection       blood glucose monitor strips Tes2t qam  times a day & as needed for symptoms of irregular blood glucose. Please provide test strips approved by insurance. 100 strip 2     montelukast (SINGULAIR) 10 MG tablet TAKE ONE TABLET BY MOUTH DAILY 30 tablet 1    albuterol sulfate HFA 108 (90 Base) MCG/ACT inhaler Inhale 2 puffs into the lungs every 6 hours as needed for Wheezing or Shortness of Breath 1 Inhaler 5    cetirizine (ZYRTEC) 10 MG tablet TAKE ONE TABLET BY MOUTH DAILY 30 tablet 1    Insulin Pen Needle ([employer] PEN NEEDLES 31G) 31G X 8 MM MISC 1 each by Does not apply route daily 100 each 3  Insulin Pen Needle ([employer] PEN NEEDLES 29G) 29G X 12MM MISC 1 each by Does not apply route daily 100 each 3    valACYclovir (VALTREX) 500 MG tablet TAKE 1 TABLET BY MOUTH DAILY 90 tablet 5    Lancets MISC 1 each by Does not apply route daily Please provide lancets approved by insurance. 100 each 3    Blood Glucose Monitoring Suppl (ACURA BLOOD GLUCOSE METER) w/Device KIT Please provide meter approved by insurance.  Check bs qam 1 kit 0    Lactobacillus (ACIDOPHILUS) TABS TAKE 1 TABLET BY MOUTH EVERY DAY 30 tablet 5    ibuprofen (ADVIL;MOTRIN) 600 MG tablet Take 1 tablet by mouth every 6 hours as needed for Pain 30 tablet 2    rizatriptan (MAXALT) 10 MG tablet Take 1 tablet by mouth once as needed for Migraine May repeat in 2 hours if needed 30 tablet 3     No current facility-administered medications for this visit.       Review of Systems   Constitutional:  Positive for fever.   HENT:  Negative for congestion, postnasal drip, rhinorrhea, sinus pressure, sinus pain and sore throat.    Eyes:  Positive for photophobia and visual disturbance.   Respiratory:  Negative for cough, shortness of breath and wheezing.    Cardiovascular:  Negative for chest pain and palpitations.   Gastrointestinal:  Negative for abdominal pain, diarrhea, nausea and vomiting.   Genitourinary:  Negative for bladder incontinence and pelvic pain.   Musculoskeletal:  Positive for back pain. Negative for gait problem.   Neurological:  Positive for tingling (left leg at times) and headaches. Negative for weakness, numbness  and paresthesias.     Vitals:    08/29/21 1455   BP: 110/72   Site: Right Upper Arm   Position: Sitting   Cuff Size: Large Adult   Pulse: (!) 103   Temp: 98.3 F (36.8 C)   SpO2: 98%   Weight: 198 lb (89.8 kg)   Height: '5\' 4"'  (1.626 m)       Physical Exam  Constitutional:       Appearance: She is well-developed.   HENT:      Head: Normocephalic and atraumatic.      Right Ear: Tympanic membrane normal.      Left Ear: Tympanic membrane normal.      Nose:      Right Sinus: No maxillary sinus tenderness or frontal sinus tenderness.      Left Sinus: No maxillary sinus tenderness or frontal sinus tenderness.      Mouth/Throat:      Pharynx: Oropharynx is clear. No pharyngeal swelling or posterior oropharyngeal erythema.   Eyes:      Conjunctiva/sclera: Conjunctivae normal.      Pupils: Pupils are equal, round, and reactive to light.   Neck:      Thyroid: No thyromegaly.      Vascular: No JVD.   Cardiovascular:      Rate and Rhythm: Normal rate and regular rhythm.      Heart sounds: Normal heart sounds.   Pulmonary:      Effort: Pulmonary effort is normal. No respiratory distress.      Breath sounds: Normal breath sounds. No wheezing.   Musculoskeletal:      Cervical back: Normal range of motion and neck supple.      Lumbar back: No spasms or tenderness. Decreased range of motion. Negative right straight leg raise test and negative left straight leg raise test.  Skin:     General: Skin is warm and dry.      Capillary Refill: Capillary refill takes less than 2 seconds.   Neurological:      Mental Status: She is alert and oriented to person, place, and time.   Psychiatric:         Behavior: Behavior normal.               An electronic signature was used to authenticate this note.    --Herbert Deaner, [name]

## 2021-08-30 ENCOUNTER — Encounter

## 2021-08-30 LAB — CBC WITH AUTO DIFFERENTIAL
Basophils %: 0.6 %
Basophils Absolute: 0.1 10*3/uL (ref 0.0–0.2)
Eosinophils %: 0.9 %
Eosinophils Absolute: 0.1 10*3/uL (ref 0.0–0.6)
Hematocrit: 40.7 % (ref 36.0–48.0)
Hemoglobin: 13.6 g/dL (ref 12.0–16.0)
Lymphocytes %: 28.9 %
Lymphocytes Absolute: 3.1 10*3/uL (ref 1.0–5.1)
MCH: 29.9 pg (ref 26.0–34.0)
MCHC: 33.5 g/dL (ref 31.0–36.0)
MCV: 89.4 fL (ref 80.0–100.0)
MPV: 9.7 fL (ref 5.0–10.5)
Monocytes %: 4.4 %
Monocytes Absolute: 0.5 10*3/uL (ref 0.0–1.3)
Neutrophils %: 65.2 %
Neutrophils Absolute: 7 10*3/uL (ref 1.7–7.7)
Platelets: 314 10*3/uL (ref 135–450)
RBC: 4.55 M/uL (ref 4.00–5.20)
RDW: 14.3 % (ref 12.4–15.4)
WBC: 10.8 10*3/uL (ref 4.0–11.0)

## 2021-08-30 LAB — COMPREHENSIVE METABOLIC PANEL
ALT: 9 U/L — ABNORMAL LOW (ref 10–40)
AST: 9 U/L — ABNORMAL LOW (ref 15–37)
Albumin/Globulin Ratio: 1.5 (ref 1.1–2.2)
Albumin: 4.6 g/dL (ref 3.4–5.0)
Alkaline Phosphatase: 105 U/L (ref 40–129)
Anion Gap: 13 (ref 3–16)
BUN: 9 mg/dL (ref 7–20)
CO2: 22 mmol/L (ref 21–32)
Calcium: 9.8 mg/dL (ref 8.3–10.6)
Chloride: 103 mmol/L (ref 99–110)
Creatinine: 0.6 mg/dL (ref 0.6–1.1)
Est, Glom Filt Rate: >60 (ref >60)
Glucose: 229 mg/dL — ABNORMAL HIGH (ref 70–99)
Potassium: 4.2 mmol/L (ref 3.5–5.1)
Sodium: 138 mmol/L (ref 136–145)
Total Bilirubin: 0.7 mg/dL (ref 0.0–1.0)
Total Protein: 7.7 g/dL (ref 6.4–8.2)

## 2021-08-30 LAB — LIPID, FASTING
Cholesterol, Fasting: 190 mg/dL (ref 0–199)
HDL: 32 mg/dL — ABNORMAL LOW (ref 40–60)
LDL Calculated: 111 mg/dL — ABNORMAL HIGH (ref <100)
Triglyceride, Fasting: 236 mg/dL — ABNORMAL HIGH (ref 0–150)
VLDL Cholesterol Calculated: 47 mg/dL

## 2021-08-30 LAB — SPECIMEN REJECTION

## 2021-08-30 LAB — TSH REFLEX TO FT4: TSH: 2.06 u[IU]/mL (ref 0.27–4.20)

## 2021-08-30 LAB — C-REACTIVE PROTEIN: CRP: 6.1 mg/L — ABNORMAL HIGH (ref 0.0–5.1)

## 2021-08-30 LAB — HEMOGLOBIN A1C
Estimated Avg Glucose: 185.8 mg/dL
Hemoglobin A1C: 8.1 %

## 2021-08-30 NOTE — Telephone Encounter (Signed)
[facility] calling to inform that Sedimentation Rate specimen was rejected due to lack of quantity. Lab asking if provider would like for this to be redrawn.

## 2021-09-11 ENCOUNTER — Ambulatory Visit: Admit: 2021-09-11 | Discharge: 2021-09-11 | Payer: PRIVATE HEALTH INSURANCE | Attending: Oncology | Primary: Oncology

## 2021-09-11 DIAGNOSIS — E119 Type 2 diabetes mellitus without complications: Secondary | ICD-10-CM

## 2021-09-11 MED ORDER — OZEMPIC (0.25 OR 0.5 MG/DOSE) 2 MG/3ML SC SOPN
2 | SUBCUTANEOUS | 0 refills | 30.00000 days | Status: DC
Start: 2021-09-11 — End: 2021-12-25

## 2021-09-11 NOTE — Assessment & Plan Note (Signed)
Stable, controlled  Continue albuterol as needed  Uses sparingly

## 2021-09-11 NOTE — Assessment & Plan Note (Signed)
Following with GYN  Surgery as planned

## 2021-09-11 NOTE — Progress Notes (Signed)
Subjective:     Shelly Richard who presentsto the office today for a preoperative consultation at the request of surgeon Dr. Birdie Sons who plans on performing Hysteroscopy on 09/20/2021. Planned anesthesia is General.  The patient has the following known anesthesia issues:  none   Patient has a bleeding risk of : no recent abnormal bleeding, no remote history of abnormal bleeding.  Patient's medications, allergies, past medical, surgical,social and family histories were reviewed and updated as appropriate.    Past Medical History:   Diagnosis Date    Allergies     Asthma     Diabetes mellitus (HCC)     Genital herpes     HTN (hypertension)     Hyperlipidemia     Hypothyroid     Urticaria      Past Surgical History:   Procedure Laterality Date    CYST REMOVAL       Family History   Problem Relation Age of Onset    Diabetes Mother     Diabetes Father      Social History     Socioeconomic History    Marital status: Single     Spouse name: Not on file    Number of children: Not on file    Years of education: Not on file    Highest education level: Not on file   Occupational History    Not on file   Tobacco Use    Smoking status: Never    Smokeless tobacco: Never   Vaping Use    Vaping Use: Never used   Substance and Sexual Activity    Alcohol use: No    Drug use: No    Sexual activity: Yes     Partners: Male   Other Topics Concern    Not on file   Social History Narrative    Not on file     Social Determinants of Health     Financial Resource Strain: Low Risk     Difficulty of Paying Living Expenses: Not hard at all   Food Insecurity: No Food Insecurity    Worried About Charity fundraiser in the Last Year: Never true    [place] in the Last Year: Never true   Transportation Needs: Unknown    Lack of Transportation (Medical): Not on file    Lack of Transportation (Non-Medical): No   Physical Activity: Not on file   Stress: Not on file   Social Connections: Not on file   Intimate Partner Violence: Not on file    Housing Stability: Unknown    Unable to Pay for Housing in the Last Year: Not on file    Number of Places Lived in the Last Year: Not on file    Unstable Housing in the Last Year: No       Review of Systems  Review of Systems   Constitutional:  Negative for chills, fatigue and fever.   HENT:  Negative for congestion, sinus pressure and sinus pain.    Respiratory:  Negative for cough, shortness of breath and wheezing.    Cardiovascular:  Negative for chest pain and palpitations.   Gastrointestinal:  Negative for abdominal pain, constipation and diarrhea.   Musculoskeletal:  Negative for arthralgias, back pain and myalgias.   Skin:  Negative for color change, pallor and rash.   Neurological:  Negative for dizziness, syncope, weakness, light-headedness and headaches.   Psychiatric/Behavioral:  Negative for behavioral problems, confusion and sleep disturbance.  The patient is not nervous/anxious.           Objective:     Vitals:    09/11/21 1300   BP: 120/72   Pulse: (!) 105   Temp: 98.3 F (36.8 C)   SpO2: 99%        Physical Exam  Physical Exam  Constitutional:       Appearance: She is well-developed.   HENT:      Head: Normocephalic and atraumatic.   Eyes:      Conjunctiva/sclera: Conjunctivae normal.      Pupils: Pupils are equal, round, and reactive to light.   Neck:      Thyroid: No thyromegaly.      Vascular: No JVD.   Cardiovascular:      Rate and Rhythm: Normal rate and regular rhythm.      Heart sounds: Normal heart sounds.   Pulmonary:      Effort: Pulmonary effort is normal. No respiratory distress.      Breath sounds: Normal breath sounds. No wheezing.   Musculoskeletal:         General: Normal range of motion.      Cervical back: Normal range of motion and neck supple.   Skin:     General: Skin is warm and dry.      Capillary Refill: Capillary refill takes less than 2 seconds.   Neurological:      Mental Status: She is alert and oriented to person, place, and time.   Psychiatric:         Behavior:  Behavior normal.       Lab Review   Lab Results   Component Value Date/Time    NA 138 08/29/2021 04:09 PM    K 4.2 08/29/2021 04:09 PM    CL 103 08/29/2021 04:09 PM    CO2 22 08/29/2021 04:09 PM    BUN 9 08/29/2021 04:09 PM    CREATININE 0.6 08/29/2021 04:09 PM    GLUCOSE 229 08/29/2021 04:09 PM    CALCIUM 9.8 08/29/2021 04:09 PM     Lab Results   Component Value Date/Time    WBC 10.8 08/29/2021 04:09 PM    HGB 13.6 08/29/2021 04:09 PM    HCT 40.7 08/29/2021 04:09 PM    MCV 89.4 08/29/2021 04:09 PM    PLT 314 08/29/2021 04:09 PM     Last a1c   08/29/2021: 8.1%    Medication Review  Current Outpatient Medications on File Prior to Visit   Medication Sig Dispense Refill    erythromycin (ROMYCIN) 5 MG/GM ophthalmic ointment ~1 cm ribbon into affected eye(s) 4 times daily 3.5 g 0    FEROSUL 325 (65 Fe) MG tablet TAKE ONE TABLET BY MOUTH DAILY WITH BREAKFAST 30 tablet 2    letrozole (FEMARA) 2.5 MG tablet       norethindrone (AYGESTIN) 5 MG tablet       losartan (COZAAR) 25 MG tablet TAKE ONE TABLET BY MOUTH DAILY 90 tablet 1    metFORMIN (GLUCOPHAGE) 500 MG tablet TAKE TWO TABLETS BY MOUTH TWICE A DAY WITH MEALS 270 tablet 1    Cholecalciferol (D3) 50 MCG (2000 UT) TABS TAKE ONE TABLET BY MOUTH DAILY 30 tablet 3    Liraglutide (VICTOZA) 18 MG/3ML SOPN SC injection Inject 1.2 mg into the skin daily 2 Adjustable Dose Pre-filled Pen Syringe 1    Insulin Pen Needle 32G X 6 MM MISC Use four times daily and as needed 100 each 5  Insulin Pen Needle 32G X 6 MM MISC Once daily 90 each 2    insulin lispro, 1 Unit Dial, (HUMALOG KWIKPEN) 100 UNIT/ML SOPN Inject 2 units for BS 150-200, 4 units for BS 201-250, 6 units for BS 251-300, 8 units for BS 301-350, 10 units for BS greater than 350 with meals 4 Adjustable Dose Pre-filled Pen Syringe 2    simvastatin (ZOCOR) 40 MG tablet TAKE ONE TABLET BY MOUTH DAILY 90 tablet 1    levothyroxine (SYNTHROID) 75 MCG tablet TAKE ONE TABLET BY MOUTH DAILY 90 tablet 1    FARXIGA 10 MG tablet TAKE  ONE TABLET BY MOUTH EVERY MORNING 90 tablet 1    insulin detemir (LEVEMIR FLEXTOUCH) 100 UNIT/ML injection pen Inject 20 Units into the skin nightly 5 Adjustable Dose Pre-filled Pen Syringe 3    Continuous Blood Gluc Sensor (FREESTYLE LIBRE 2 SENSOR) MISC Use as directed 6 each 1    Continuous Blood Gluc Sensor (FREESTYLE LIBRE 3 SENSOR) MISC Check glucose per diagnosis E11.9 6 each 3    EPINEPHrine (EPIPEN) 0.3 MG/0.3ML SOAJ injection       blood glucose monitor strips Tes2t qam  times a day & as needed for symptoms of irregular blood glucose. Please provide test strips approved by insurance. 100 strip 2    montelukast (SINGULAIR) 10 MG tablet TAKE ONE TABLET BY MOUTH DAILY 30 tablet 1    albuterol sulfate HFA 108 (90 Base) MCG/ACT inhaler Inhale 2 puffs into the lungs every 6 hours as needed for Wheezing or Shortness of Breath 1 Inhaler 5    cetirizine (ZYRTEC) 10 MG tablet TAKE ONE TABLET BY MOUTH DAILY 30 tablet 1    Insulin Pen Needle ([employer] PEN NEEDLES 31G) 31G X 8 MM MISC 1 each by Does not apply route daily 100 each 3    Insulin Pen Needle ([employer] PEN NEEDLES 29G) 29G X 12MM MISC 1 each by Does not apply route daily 100 each 3    valACYclovir (VALTREX) 500 MG tablet TAKE 1 TABLET BY MOUTH DAILY 90 tablet 5    Lancets MISC 1 each by Does not apply route daily Please provide lancets approved by insurance. 100 each 3    Blood Glucose Monitoring Suppl (ACURA BLOOD GLUCOSE METER) w/Device KIT Please provide meter approved by insurance.  Check bs qam 1 kit 0    Lactobacillus (ACIDOPHILUS) TABS TAKE 1 TABLET BY MOUTH EVERY DAY 30 tablet 5    ibuprofen (ADVIL;MOTRIN) 600 MG tablet Take 1 tablet by mouth every 6 hours as needed for Pain 30 tablet 2    rizatriptan (MAXALT) 10 MG tablet Take 1 tablet by mouth once as needed for Migraine May repeat in 2 hours if needed 30 tablet 3     No current facility-administered medications on file prior to visit.       Assessment:       1. Type 2 diabetes mellitus without  complication, with long-term current use of insulin ([place])    2. Uterine leiomyoma, unspecified location    3. Strabismus    4. Mixed hyperlipidemia    5. Mild intermittent asthma without complication    6. Iron deficiency anemia, unspecified iron deficiency anemia type    7. Fever, unspecified fever cause    8. Essential hypertension    9. Acute intractable tension-type headache    10. Acquired hypothyroidism    11. Preop examination           Plan:  Type 2 diabetes mellitus without complication, with long-term current use of insulin (HCC)  Recent A1c 8.1  CGM in place  Working on diet  Doing well on current insulin regimen  Would like to try Ozempic over Victoza due to side effects    Uterine fibroid  Following with GYN  Surgery as planned    Strabismus  Scheduled with [facility] next month     Mixed hyperlipidemia  Stable, controlled   Continue statin     Mild intermittent asthma without complication  Stable, controlled  Continue albuterol as needed  Uses sparingly    Iron deficiency anemia  Stable, controlled  Continue iron    Fever  Symptoms resolved    Essential hypertension  Stable, controlled  Continue current regimen    Acute intractable tension-type headache  Resolved    Acquired hypothyroidism  Stable, controlled  Continue Synthroid    Preop examination  Perioperative risk related to the patient's upcoming surgery is considered low. she is cleared for surgery.  Pre-op exam was completed on 09/11/21 2:26 PM.

## 2021-09-11 NOTE — Assessment & Plan Note (Signed)
Scheduled with [facility] next month

## 2021-09-11 NOTE — Assessment & Plan Note (Signed)
Perioperative risk related to the patient's upcoming surgery is considered low. she is cleared for surgery.  Pre-op exam was completed on 09/11/21 2:26 PM.

## 2021-09-11 NOTE — Assessment & Plan Note (Signed)
Recent A1c 8.1  CGM in place  Working on diet  Doing well on current insulin regimen  Would like to try Ozempic over Victoza due to side effects

## 2021-09-11 NOTE — Assessment & Plan Note (Signed)
Stable, controlled  Continue current regimen

## 2021-09-11 NOTE — Assessment & Plan Note (Signed)
Symptoms resolved

## 2021-09-11 NOTE — Assessment & Plan Note (Signed)
Resolved

## 2021-09-11 NOTE — Assessment & Plan Note (Signed)
Stable, controlled  Continue Synthroid

## 2021-09-11 NOTE — Assessment & Plan Note (Signed)
Stable, controlled  Continue iron

## 2021-09-11 NOTE — Assessment & Plan Note (Signed)
Stable, controlled   Continue statin

## 2021-09-14 ENCOUNTER — Ambulatory Visit: Payer: PRIVATE HEALTH INSURANCE | Primary: Oncology

## 2021-09-14 NOTE — Unmapped (Addendum)
[facility]    We're pleased that you have chosen [facility] for your upcoming procedure.  The staff serving you is professionally trained to provide the highest quality care.  We encourage you to ask questions and to let the staff know your special needs.  We want your visit to be as comfortable as possible. Below you will find instructions for your upcoming procedure. Please read the instructions and follow closely, failure to follow instructions may lead to delay or cancellation of your surgery.     Your procedure is scheduled on Thursday 6/8.    Please arrive at 11:00 AM.    Location: Come in the main entrance of the hospital and the registration desk will be on your right as soon as you enter the lobby.   We have free valet parking from 6 AM to 630 PM.     [facility]        [address]  [place], [place] [address]    Surgery area phone number:   [phone]       OTHER USEFUL INFORMATION [facility])     If you have any questions about these instructions please call us at:    [facility] for Per[facility]  Monday - Friday 8:00 am - 4:30 pm  [phone]    For questions regarding surgery, please call your surgeon.     GENERAL INFORMATION BEFORE SURGERY     ?STOP shaving in the area of the surgery for the 3 days prior.   If needed, a trained staff member will clip the area immediately before your surgery.    Contact your surgeon if you develop any of the following before surgery:  A fever or symptoms of an Upper Respiratory Infection.  A skin rash/wound/lesion.  A cold or are sick ANYTIME.    If you smoke, quit smoking as far in advance of surgery as possible.   Patients who quit at least 30 days before surgery may have better outcomes.          MEDICATIONS     ? OK TO TAKE     ACETAMINOPHEN (TYLENOL): This is the only over the counter pain medication that is ok to take before surgery.   Vitamins to treat a specific  deficiency: ex. Vitamins B, C and D, Calcium, Folate, Iron, Magnesium and Potassium.    ? ONE WEEK PRIOR TO YOUR PROCEDURE STOP/ DO NOT TAKE          (the following are held due to increased risk of bleeding).    MULTIVITAMINS  SUPPLEMENTS/HERBAL SUPPLEMENTS: Glucosamine-Chondroitin, Garlic, Ginseng, Ginko Biloba, Vitamin E, Echinacea, Turmeric, Saw Palmetto, St. John's Wort, Herbal tea  CANNABIS PRODUCTS: ex. CBD oil, Gummies or edibles, THC   ASPIRIN  NSAIDS (non-steroidal anti-inflammatories such as Ibuprofen, Advil, Aleve, Naproxen,                       Excedrin, Meloxicam/Mobic, Celoxib/Celebrex, oral Diclofenac/Voltaren)        DIABETIC PATIENTS     ?Pay close attention to your blood sugar. Try to keep it in the range your doctor recommends    Instructions for Diabetic Medications:    FARXIGA : PLEASE STOP THIS MEDICATION 3 DAYS PRIOR TO SURGERY    Your last dose before surgery will be Sunday 09/16/21.      STOP the following medications 3 days before surgery and day of surgery:   ?  Empagliflozin (Jardiance), Empagliflozin+Metformin (Jardiomet), Dapagliflozin Marcelline Deist), Canagliflozin (Invokana), Dapagliflozin+Metformin (Xigudo).          CONTINUE ALL OTHER PRESCRIBED HOME MEDICATIONS   UNLESS OTHERWISE INSTRUCTED.      ** PLEASE DO NOT TAKE YOUR LOSARTAN THE NIGHT PRIOR TO SURGERY **      MORNING OF THE PROCEDURE  EATING AND DRINKING INSTRUCTIONS       IF your physician has prescribed a special diet or bowel prep prior to your surgery or procedure, please follow the instructions from your physician's office.     IF your arrival time changes, the times below will need to be adjusted accordingly.     Nothing to eat or drink after midnight except:        2 HOURS     Prior to hospital arrival.      Complete clear fluid (up to 16 oz) by 9:00 AM  A clear fluid may have color, but you need to be able to see through it (examples: water, clear broth, Gatorade??, apple juice). Coffee and tea should NOT contain milk,  creamer or sugar.  Juice should NOT contain pulp. No gum or mints beyond 2 hours.        MORNING OF SURGERY    See EATING and DRINKING INSTRUCTIONS ABOVE.    ?TAKE ONLY these medications the morning of surgery with a small sip of water     LEVOTHYROXINE  ALBUTEROL INHALER ( IF NEEDED AND BRING IT WITH YOU )  CLARITIN  NORETHIDRONE  1/2 YOUR DOSE OF LEVEMIR ( LONG ACTING INSULIN ) = 10 UNITS      PLEASE DO NOT TAKE YOUR METFORMIN, VICTOZA OR REGULAR INSULIN THE MORNING OF YOUR SURGERY       PLEASE BRUSH YOUR TEETH    SHOWER: BOTH the night before and the morning of surgery with an antibacterial soap, such as Dial.    ?Remove all makeup, nail polish, jewelry, wristwatch, body piercings.  ?Do not use powder, lotions and perfume/cologne.     WHAT TO WEAR: Wear casual, loose fitting, and comfortable clothing.    Bring with you:  ? List of your medications, dose and how often you take the medication.  ? List of over-the-counter medications and supplements, dose and how often you take the       medication.    ? Photo ID  ? Insurance card     ? Glasses and case    ? Rescue Inhaler (albuterol)      Leave at home:    We recommend that you leave valuables (ex. money, jewelry, credit cards) at home or     with your family.   ? Do not bring any medications to the hospital. (Exception: rescue inhaler, transplant medications, Parkinson's or seizure medication.)  ? CONTACT LENSES- leave at home or bring a case for safe keeping.      TRANSPORTATION and VISITORS     Please make transportation arrangements and bring ONE responsible adult to accompany you home and remain with you for 24 hours after having received anesthesia.              You will NOT be permitted to drive.  You may take a taxi, uber, etc., only if accompanied by a familiar adult.    VISITING HOURS: Same Day Surgery/Pre-Anesthesia: Open to two visitors per patient, from 5:30 a.m. to 7 p.m.      ICU and Stepdown units: Open to two visitors per patient at a time,  from 2  p.m. to 7 p.m.    Inpatient units: Open to any number of visitors 24/7.        WHAT TO EXPECT     Make sure all of your health care givers are checking your ID bracelet and verifying your name and date of birth.  You will actively be involved in verifying the type of surgery you are having and the correct site.      Your health care givers should be cleaning their hands with soap and water or antibacterial foam before taking care of you and if they do not, it is ok to remind them to do so.  Antibacterial showering and good hand hygiene are essential to prevent surgical site infections and reduce the spread of MRSA.     In an effort to reduce the risks of blood clots after your surgery you may have compression sleeves on your lower legs.  These sleeves help facilitate circulation and decrease the chances of developing any blood clots.     You may be given an incentive spirometer after surgery to use every hour to help prevent pneumonia by having you take deep breaths in and out.  You will be given instructions about proper use after surgery.

## 2021-09-19 NOTE — Unmapped (Signed)
Received return call from patient today at 1:00 PM.  Confirmed [facility] which is [facility] with Dr. Doreene AdasHurley on 09-20-21.  Confirmed 11:00 AM check-in time and 1:00 PM surgery start time.  Reminded of NPO instructions.

## 2021-09-19 NOTE — Unmapped (Signed)
Attempted to contact patient today at 10:42 AM.  Someone picked up phone and when I said Hello--they hung up.    Attempted to contact patient today at 10:43 AM.  LM on VM.    Calling to confirm surgery which is scheduled at [facility] with Dr. Doreene Adas on 09-20-21 at 1:00 PM.

## 2021-09-20 DIAGNOSIS — M3322 Polymyositis with myopathy: Secondary | ICD-10-CM | POA: Diagnosis not present

## 2021-09-20 LAB — POC GLU MONITORING DEVICE
POC Glucose Monitoring Device: 212 mg/dL (ref 70–100)
POC Glucose Monitoring Device: 188 mg/dL (ref 70–100)

## 2021-09-20 LAB — POC HCG QUALITATIVE, URINE: hCG Qualitative -Clinitek: NEGATIVE

## 2021-09-20 MED ORDER — sodium chloride 0.9 % irrigation
0.9 | PRN
Start: 2021-09-20 — End: 2021-09-20
  Administered 2021-09-20: 19:00:00 12000

## 2021-09-20 MED ORDER — naloxone (NARCAN) injection 0.04 mg
0.4 | INTRAMUSCULAR | Status: AC | PRN
Start: 2021-09-20 — End: 2021-09-20

## 2021-09-20 MED ORDER — oxyCODONE (ROXICODONE) immediate release tablet 2.5 mg
5 | ORAL | PRN
Start: 2021-09-20 — End: 2021-09-20

## 2021-09-20 MED ORDER — ibuprofen (MOTRIN) 600 MG tablet
600 | ORAL_TABLET | Freq: Four times a day (QID) | ORAL | 1 refills | Status: AC | PRN
Start: 2021-09-20 — End: ?
  Filled 2021-09-20: qty 30, 8d supply, fill #0

## 2021-09-20 MED ORDER — ondansetron (ZOFRAN) injection 4 mg
4 | Freq: Three times a day (TID) | INTRAMUSCULAR | Status: AC | PRN
Start: 2021-09-20 — End: 2021-09-20

## 2021-09-20 MED ORDER — midazolam (PF) (VERSED) 1 mg/mL injection
1 | INTRAMUSCULAR | Status: AC
Start: 2021-09-20 — End: ?

## 2021-09-20 MED ORDER — methylene blue (PROVAYBLUE) 5 mg/mL IV solution
5 | INTRAVENOUS
Start: 2021-09-20 — End: 2021-09-20

## 2021-09-20 MED ORDER — insulin lispro (humaLOG/ADMELOG) injection 0-7 Units
100 | SUBCUTANEOUS
Start: 2021-09-20 — End: 2021-09-20
  Administered 2021-09-20: 16:00:00 3 [IU] via SUBCUTANEOUS
  Administered 2021-09-20: 19:00:00 1 [IU] via SUBCUTANEOUS

## 2021-09-20 MED ORDER — HYDROmorphone (DILAUDID) injection Syrg
1 | INTRAMUSCULAR | Status: AC | PRN
Start: 2021-09-20 — End: 2021-09-20
  Administered 2021-09-20: 18:00:00 .5 via INTRAVENOUS

## 2021-09-20 MED ORDER — fentaNYL (SUBLIMAZE) 50 mcg/mL injection
50 | INTRAMUSCULAR | Status: AC
Start: 2021-09-20 — End: ?

## 2021-09-20 MED ORDER — fentaNYL (SUBLIMAZE) injection 12.5 mcg
50 | INTRAMUSCULAR | PRN
Start: 2021-09-20 — End: 2021-09-20

## 2021-09-20 MED ORDER — propofol 10 mg/ml (DIPRIVAN) injection
10 | INTRAVENOUS | PRN
Start: 2021-09-20 — End: 2021-09-20
  Administered 2021-09-20: 17:00:00 200 via INTRAVENOUS

## 2021-09-20 MED ORDER — ondansetron (ZOFRAN) injection
4 | INTRAMUSCULAR | Status: AC | PRN
Start: 2021-09-20 — End: 2021-09-20
  Administered 2021-09-20: 17:00:00 4 via INTRAVENOUS

## 2021-09-20 MED ORDER — fentaNYL (SUBLIMAZE) injection 25 mcg
50 | INTRAMUSCULAR | PRN
Start: 2021-09-20 — End: 2021-09-20

## 2021-09-20 MED ORDER — lactated Ringers infusion
INTRAVENOUS
Start: 2021-09-20 — End: 2021-09-20
  Administered 2021-09-20: 16:00:00 20 mL/h via INTRAVENOUS

## 2021-09-20 MED ORDER — glucose chewable tablet 12 g
4 | ORAL | PRN
Start: 2021-09-20 — End: 2021-09-20

## 2021-09-20 MED ORDER — vasopressin (VASOSTRICT) IV solution Soln
20 | INTRAVENOUS | Status: AC | PRN
Start: 2021-09-20 — End: 2021-09-20
  Administered 2021-09-20: 18:00:00 5

## 2021-09-20 MED ORDER — esmoloL (BREVIBLOC) 100 mg/10 mL (10 mg/mL) injection
100 | INTRAVENOUS | Status: AC
Start: 2021-09-20 — End: ?

## 2021-09-20 MED ORDER — scopolamine (TRANSDERM-SCOP) (1 mg over 3 days) 1 patch
1 | Freq: Once | TRANSDERMAL | Status: AC
Start: 2021-09-20 — End: 2021-09-20
  Administered 2021-09-20: 16:00:00 1 via TRANSDERMAL

## 2021-09-20 MED ORDER — acetaminophen (TYLENOL) tablet 975 mg
325 | ORAL | Status: AC | PRN
Start: 2021-09-20 — End: 2021-09-20
  Administered 2021-09-20: 16:00:00 975 mg via ORAL

## 2021-09-20 MED ORDER — ketorolac (TORADOL) injection 30 mg
30 | INTRAMUSCULAR | Status: AC | PRN
Start: 2021-09-20 — End: 2021-09-20
  Administered 2021-09-20: 19:00:00 30 via INTRAVENOUS

## 2021-09-20 MED ORDER — esmoloL (BREVIBLOC) injection
100 | INTRAVENOUS | PRN
Start: 2021-09-20 — End: 2021-09-20
  Administered 2021-09-20: 18:00:00 10 via INTRAVENOUS

## 2021-09-20 MED ORDER — midazolam (PF) (VERSED) injection
1 | INTRAMUSCULAR | PRN
Start: 2021-09-20 — End: 2021-09-20
  Administered 2021-09-20: 17:00:00 2 via INTRAVENOUS

## 2021-09-20 MED ORDER — oxyCODONE (ROXICODONE) immediate release tablet 5 mg
5 | ORAL | PRN
Start: 2021-09-20 — End: 2021-09-20
  Administered 2021-09-20: 21:00:00 5 mg via ORAL

## 2021-09-20 MED ORDER — sodium chloride 0.9 % infusion
INTRAVENOUS
Start: 2021-09-20 — End: 2021-09-20

## 2021-09-20 MED ORDER — HYDROmorphone (DILAUDID) 0.5 mg/0.5 mL injection Syrg
0.5 | INTRAMUSCULAR | Status: AC
Start: 2021-09-20 — End: ?

## 2021-09-20 MED ORDER — dextrose 10%-water (D10W) IV soln
INTRAVENOUS | PRN
Start: 2021-09-20 — End: 2021-09-20

## 2021-09-20 MED ORDER — dexamethasone (DECADRON) injection
10 | INTRAMUSCULAR | Status: AC | PRN
Start: 2021-09-20 — End: 2021-09-20
  Administered 2021-09-20: 17:00:00 8 via INTRAVENOUS

## 2021-09-20 MED ORDER — ferric subsulfate (ASTRINGYN) 259 mg/g solution
259 | TOPICAL
Start: 2021-09-20 — End: 2021-09-20

## 2021-09-20 MED ORDER — lidocaine (PF) 20 mg/mL (2 %) Soln
20 | INTRAVENOUS | Status: AC | PRN
Start: 2021-09-20 — End: 2021-09-20
  Administered 2021-09-20: 17:00:00 100 via INTRAVENOUS

## 2021-09-20 MED ORDER — acetaminophen (TYLENOL) 325 MG tablet
325 | ORAL_TABLET | Freq: Four times a day (QID) | ORAL | 1 refills | 11.00000 days | Status: AC | PRN
Start: 2021-09-20 — End: ?
  Filled 2021-09-20: qty 30, 4d supply, fill #0

## 2021-09-20 MED ORDER — vasopressin (VASOSTRICT) 20 unit/mL IV solution Soln
20 | INTRAVENOUS
Start: 2021-09-20 — End: 2021-09-20

## 2021-09-20 MED ORDER — fentaNYL (SUBLIMAZE) injection
50 | INTRAMUSCULAR | Status: AC | PRN
Start: 2021-09-20 — End: 2021-09-20
  Administered 2021-09-20 (×2): 50 via INTRAVENOUS

## 2021-09-20 MED FILL — LACTATED RINGERS INTRAVENOUS SOLUTION: 20.00 20.00 mL/hr | INTRAVENOUS | Qty: 1000

## 2021-09-20 MED FILL — HYDROMORPHONE 0.5 MG/0.5 ML INJECTION SYRINGE: 0.5 0.5 mg/0.5 mL | INTRAMUSCULAR | Qty: 0.5

## 2021-09-20 MED FILL — OXYCODONE 5 MG TABLET: 5 5 MG | ORAL | Qty: 1

## 2021-09-20 MED FILL — ASTRINGYN 259 MG/G TOPICAL SOLUTION: 259 259 mg/g | TOPICAL | Qty: 8

## 2021-09-20 MED FILL — VASOPRESSIN 20 UNIT/ML INTRAVENOUS SOLUTION: 20 20 unit/mL | INTRAVENOUS | Qty: 1

## 2021-09-20 MED FILL — TYLENOL 325 MG TABLET: 325 325 mg | ORAL | Qty: 3

## 2021-09-20 MED FILL — PROVAYBLUE 5 MG/ML INTRAVENOUS SOLUTION: 5 5 mg/mL | INTRAVENOUS | Qty: 10

## 2021-09-20 MED FILL — SODIUM CHLORIDE 0.9 % INTRAVENOUS SOLUTION: INTRAVENOUS | Qty: 100

## 2021-09-20 MED FILL — HUMALOG U-100 INSULIN 100 UNIT/ML SUBCUTANEOUS SOLUTION: 100 100 unit/mL | SUBCUTANEOUS | Qty: 3

## 2021-09-20 MED FILL — TRANSDERM-SCOP 1 MG OVER 3 DAYS TRANSDERMAL PATCH: 1 1 mg over 3 days | TRANSDERMAL | Qty: 1

## 2021-09-20 MED FILL — ESMOLOL 100 MG/10 ML (10 MG/ML) INTRAVENOUS SOLUTION: 100 100 mg/10 mL (10 mg/mL) | INTRAVENOUS | Qty: 10

## 2021-09-20 MED FILL — FENTANYL (PF) 50 MCG/ML INJECTION SOLUTION: 50 50 mcg/mL | INTRAMUSCULAR | Qty: 2

## 2021-09-20 MED FILL — MIDAZOLAM (PF) 1 MG/ML INJECTION SOLUTION: 1 1 mg/mL | INTRAMUSCULAR | Qty: 2

## 2021-09-20 MED FILL — HUMALOG U-100 INSULIN 100 UNIT/ML SUBCUTANEOUS SOLUTION: 100 100 unit/mL | SUBCUTANEOUS | Qty: 1

## 2021-09-20 NOTE — Unmapped (Signed)
INTRA-OP POST BRIEFING NOTE: LUCINDIA LEMLEY      Specimens: 1  Specimens     ID Source Type Tests Collected By Collected At Frozen? Attributes Order ID Breast Spec Formalin Marked as Sent    A Uterus Tissue ?? SURGICAL PATHOLOGY EXAM   Eliberto Ivory, MD 09/20/21 1441  Sent in Formalin ?? [id number]    09/20/21 1442    Comment: Intrauterine Fibriods          Prior to leaving the room: Nurse confirmed name of procedure, completion of instrument, sponge & needle counts, reads specimen labels aloud including patient name and addresses any equipment issues? Nurse confirmed wound class. Nurse to surgeon and anesthesia: What are key concerns for recovery and management of the patient?  Yes      Blood products stored at appropriate temperatures prior to return to blood bank (if applicable)? N/A      Patient identification band secured on patient prior to transfer out of the operating room? Yes    Temporary devices implanted for the duration of the surgery removed and evaluated for intactness and completeness prior to closure? N/A      Other Comments: 1750 Fluid Deficit    Signed: Natividad Brood    Date: 09/20/2021    Time: 2:43 PM

## 2021-09-20 NOTE — Unmapped (Signed)
Anesthesia Post Note    Patient: Maria Stevenson    Procedure(s) Performed: Procedure(s):  HYSTEROSCOPY OPERATIVE    Anesthesia type: general    Patient location: PACU    Airway: Patent    Post pain: Adequate analgesia    Nausea / Vomiting: Absent    Post-operative Hydration Status: Adequate    Post assessment: no apparent anesthetic complications    Last Vitals:   Vitals:    09/20/21 1500 09/20/21 1515 09/20/21 1530 09/20/21 1545   BP: 156/89 147/88 135/87 146/88   BP Location:       Patient Position:       Pulse: 97 98 99 98   Resp: 13 12 13 14    Temp:       TempSrc:       SpO2: 98% 100% 100% 98%   Weight:       Height:            Post vital signs: stable    Level of consciousness: awake    Complications:  There were no known notable events for this encounter.

## 2021-09-20 NOTE — Unmapped (Signed)
Anesthesia Transfer of Care Note    Patient: Maria Stevenson  Procedure(s) Performed: Procedure(s):  HYSTEROSCOPY OPERATIVE    Patient location: PACU    Anesthesia type: general    Airway Device on Arrival to PACU/ICU: Room Air    IV Access: Peripheral    Monitors Recommended to be Used During PACU/ICU: Standard Monitors    Outstanding Issues to Address: None    Level of Consciousness: awake    Post vital signs:    Vitals:    09/20/21 1441   BP: 130/75   Pulse: 93   Resp: 17   Temp: 98   SpO2: 99%       Complications:  No notable events documented.

## 2021-09-20 NOTE — Unmapped (Signed)
Pt received from pacu in stable condition. Pt tolerating PO intake.    Discharge instructions given to patient and family. Questions, comments, and concerns addressed. Prescriptions and as needed supplies provided to patient/family.     Pt assisted OOB, voided post op. IV removed.

## 2021-09-20 NOTE — Unmapped (Signed)
[place]  DEPARTMENT OF ANESTHESIOLOGY  PRE-PROCEDURAL EVALUATION    Maria Stevenson is a 36 y.o. year old female presenting for:    Procedure(s):  HYSTEROSCOPY OPERATIVE    Surgeon:   Eliberto Ivory, MD    Chief Complaint     Abnormal uterine bleeding [N93.9]    Review of Systems     Anesthesia Evaluation    Patient summary reviewed and nursing notes reviewed.  All other systems reviewed and are negative.     No history of anesthetic complications   I have reviewed the History and Physical Exam, any relevant changes are noted in the anesthesia pre-operative evaluation.      Cardiovascular:    Hypertension is.      Neuro/Muscoloskeletal/Psych:      (-) seizures.     Pulmonary:    (+) asthma (Mild intermittent).        GI/Hepatic/Renal:      (-) GERD, liver disease, renal disease.    Endo/Other:    (+) hypothyroidism.  Diabetes (A1c 10.7), poorly controlled, type 2, using insulin.        Past Medical History     Past Medical History:   Diagnosis Date   ??? Asthma    ??? Dermatitis    ??? DM (diabetes mellitus), type 2 (CMS-HCC)    ??? Hypothyroid    ??? Varicella        Past Surgical History     Past Surgical History:   Procedure Laterality Date   ??? CYST REMOVAL     ??? HYSTEROSCOPY N/A 11/17/2018    Procedure: Hysteroscopic resection of fibroid;  Surgeon: Ali Lowe, MD;  Location: [facility] OR;  Service: Gynecology;  Laterality: N/A;   ??? WISDOM TOOTH EXTRACTION         Family History     Family History   Problem Relation Age of Onset   ??? Diabetes Mother    ??? Diabetes Father    ??? Melanoma Neg Hx        Social History     Social History     Socioeconomic History   ??? Marital status: Single     Spouse name: Not on file   ??? Number of children: Not on file   ??? Years of education: Not on file   ??? Highest education level: Not on file   Occupational History   ??? Not on file   Tobacco Use   ??? Smoking status: Never   ??? Smokeless tobacco: Never   Substance and Sexual Activity   ??? Alcohol use: No   ??? Drug use: Never   ??? Sexual activity:  Yes     Partners: Male   Other Topics Concern   ??? Caffeine Use Yes   ??? Occupational Exposure Not Asked   ??? Exercise No   ??? Seat Belt Yes   Social History Narrative   ??? Not on file     Social Determinants of Health     Financial Resource Strain: Not on file   Physical Activity: Not on file   Stress: Not on file   Social Connections: Not on file   Housing Stability: Not on file       Medications     Allergies:  Allergies   Allergen Reactions   ??? Semaglutide Hives   ??? Lisinopril    ??? Peanut Swelling     Throat and tongue itch, angioedema of face  Throat and tongue itch, angioedema of face  Home Meds:  Prior to Admission medications as of 09/20/21 1117   Medication Sig Taking?   ALBUTEROL INHL Inhale into the lungs. Yes   biotin 10,000 mcg Cap Take 10,000 mcg by mouth daily. Yes   dapagliflozin (FARXIGA) 10 mg Tab tablet Take 1 tablet (10 mg total) by mouth daily. Yes   ergocalciferol, vitamin D2, (VITAMIN D ORAL) Take by mouth. Yes   ferrous sulfate/vit C/folic ac (FERROUS SULFATE-C-FOLIC ACID ORAL) Take 325 mg by mouth daily. Yes   ibuprofen (MOTRIN) 600 MG tablet Take 1 tablet (600 mg total) by mouth every 6 hours as needed for Pain. Yes   insulin detemir U-100 (LEVEMIR) 100 unit/mL injection Inject subcutaneously at bedtime. Yes   insulin lispro (HUMALOG/ADMELOG) 100 unit/mL injection Inject subcutaneously 3 times a day with meals. Yes   levothyroxine (SYNTHROID) 75 MCG tablet Take 1 tablet (75 mcg total) by mouth every morning before breakfast. Yes   liraglutide (VICTOZA) 0.6 mg/0.1 mL (18 mg/3 mL) PnIj Inject 1.2 mg subcutaneously daily. Yes   loratadine (CLARITIN) 10 mg tablet Take 1 tablet (10 mg total) by mouth daily. Yes   losartan (COZAAR) 25 MG tablet Take 1 tablet (25 mg total) by mouth daily. Yes   metFORMIN (GLUCOPHAGE) 500 MG tablet Take 2 tablets (1,000 mg total) by mouth 2 times a day with meals. Yes   montelukast (SINGULAIR) 10 mg tablet Take 1 tablet (10 mg total) by mouth at bedtime. Yes    multivit-min/ferrous fumarate (MULTI VITAMIN ORAL) Take by mouth. Yes   norethindrone (AYGESTIN) 5 mg tablet Take 1 tablet (5 mg total) by mouth daily. Yes   simvastatin (ZOCOR) 40 MG tablet Take 1 tablet (40 mg total) by mouth at bedtime. Yes   albuterol (ACCUNEB) 0.63 mg/3 mL nebulizer solution Inhale 1 ampule by nebulization every 6 hours as needed.    hydrocortisone 1 % cream Apply topically 2 times a day.    lidocaine (XYLOCAINE) 5 % ointment Apply topically as needed.    valACYclovir (VALTREX) 500 MG tablet Take 1 tablet (500 mg total) by mouth 2 times a day.        Inpatient Meds:  Scheduled:   ??? insulin lispro  0-7 Units Subcutaneous Q2H2 [facility]   ??? scopolamine  1 patch Transdermal Once     Continuous:   ??? lactated Ringers 20 mL/hr (09/20/21 1147)       PRN: dextrose 10% in water **OR** dextrose 10% in water, glucose    Vital Signs     Wt Readings from Last 3 Encounters:   09/20/21 204 lb (92.5 kg)   08/24/21 204 lb 1.6 oz (92.6 kg)   06/22/21 196 lb (88.9 kg)     Ht Readings from Last 3 Encounters:   09/20/21 5' 4 (1.626 m)   08/24/21 5' 4 (1.626 m)   06/22/21 5' 4 (1.626 m)     Temp Readings from Last 3 Encounters:   09/20/21 98.3 ??F (36.8 ??C) (Oral)   08/24/21 97.7 ??F (36.5 ??C)   06/22/21 98.5 ??F (36.9 ??C)     BP Readings from Last 3 Encounters:   09/20/21 (!) 122/92   08/24/21 118/74   06/22/21 154/88     Pulse Readings from Last 3 Encounters:   09/20/21 93   08/24/21 109   06/22/21 94     @LASTSAO2 (3)@    Physical Exam     Airway:     Mallampati: III  Mouth Opening: >2 FB  TM distance: > =  3 FB  Neck ROM: full    Dental:   - No obvious cracked, loose, chipped, or missing teeth.     Pulmonary:      Breathing: unlabored        Cardiovascular:     Rhythm: regular  Rate: normal    Neuro/Musculoskeletal/Psych:    Mental status: alert and oriented to person, place and time.          Abdominal:     Obese.    Current OB Status:       Other Findings:        Laboratory Data     Lab Results   Component Value  Date    WBC 9.4 05/01/2021    HGB 10.6 (L) 05/01/2021    HCT 36.3 05/01/2021    MCV 73 (L) 05/01/2021    PLT 317 05/01/2021       No results found for: [facility]    Lab Results   Component Value Date    GLUCOSE 437 (H) 05/01/2021    BUN 10 05/01/2021    CO2 20 05/01/2021    CREATININE 0.66 05/01/2021    K 4.2 05/01/2021    NA 137 05/01/2021    CL 102 05/01/2021    CALCIUM 9.4 05/01/2021    ALBUMIN 4.1 05/01/2021    PROT 7.4 05/01/2021    ALKPHOS 174 (H) 05/01/2021    ALT 11 05/01/2021    AST 11 05/01/2021    BILITOT 0.4 05/01/2021       No results found for: PTT, INR    Lab Results   Component Value Date    PREGTESTUR Negative 09/20/2021       Anesthesia Plan     ASA 3         Female and current non-smoker    Anesthesia Type:  general.      PONV Risk Factors: female, current non-smoker,              Induction:   Intravenous induction.      Anesthetic plan and risks discussed with patient.    Plan, alternatives, and risks of anesthesia, including death, have been explained to and discussed with the patient/legal guardian.  By my assessment, the patient/legal guardian understands and agrees.  Scenario presented in detail.  Questions answered.    Blood products not discussed.      Plan discussed with attending and CRNA.

## 2021-09-20 NOTE — Unmapped (Signed)
Discharge Instructions  Procedure Performed: Hysteroscopic Myomectomy    Patient: TAUNIA FRASCO         Vaginal bleeding:  - Some bleeding or spotting is normal. If you are saturating a pad with blood hourly, please call the office.     Activity:  - Place nothing in the vagina for two weeks. No tampons, intercourse or douching.  - You may eat your usual diet.   - You may drive the day after your procedure as long as you are not taking narcotic pain medication.     Medications:  - Resume all your usual medications the day you return home, unless instructed otherwise.   - Continue aygestin at least until your post-op visit.     Pain:  - If you experience cramping or pain, you may take 600mg  ibuprofen every 6 hours with 650mg  tylenol every 6 hours. If pain is persistent despite ibuprofen and tylenol, please call the office.     Bowels:  - Colace and senekot are stool softeners available over the counter. They are not laxatives and take about two days to soften your stools. If you are taking narcotics for pain regularly, use a stool softener twice a day.  - Miralax is a good laxative to take on a daily basis if you have any history of constipation or if you are constipated post-op                                          Things to call us about:  - Temperature greater than 100.4 degrees.   - Saturating a pad with blood  in 1 hour or less.   - Foul smelling drainage from the vagina.   - Severe pain that is not responsive to ibuprofen/tylenol.           Please call to schedule an appointment for two weeks after your procedure.

## 2021-09-20 NOTE — Unmapped (Signed)
HYSTEROSCOPY OPERATIVE  Brief Op Note  Maria Stevenson  09/20/2021      Pre-op Diagnosis: Abnormal uterine bleeding - leiomyoma        Post-op Diagnosis: Abnormal uterine bleeding - leiomyoma     Procedure(s):  Hysteroscopic dilation and curettage, attempted myomectmoy       Surgeon(s):  Eliberto IvoryEmily Gay Blain Hunsucker, MD    Anesthesia: Choice    Staff:   Circulator: Natividad BroodBenjamin Saguid, RN  Scrub Person: [facility] Center[facility]isty Bostick    Estimated Blood Loss: Minimal                 Specimens:  Leiomyoma           Drains:               There were no complications unless listed below.         [name] [name]     Date: 09/20/2021  Time: 2:27 PM

## 2021-09-20 NOTE — Unmapped (Signed)
PATIENT NAME:   Maria Stevenson  MRN: [id number]  DATE OF BIRTH:  1986-01-14  CSN: [id number]    SURGEON:  Eliberto Ivory, MD  SURGERY DATE: 09/20/2021                                     OPERATIVE REPORT      PREOPERATIVE DIAGNOSIS(ES):  Abnormal uterine bleeding - leiomyoma   POSTOPERATIVE DIAGNOSIS(ES):  Same  PROCEDURE(S) PERFORMED:  Hysteroscopic dilation and curettage, attempted myomectomy   ATTENDING SURGEON: Iman Reinertsen Garlan Fair, MD  ASSISTANT(S):  Abagail Kitchens, MD; Jeneen Montgomery M3  ANESTHESIA:  LMA  INTRAVENOUS FLUIDS:  800 ml  ESTIMATED BLOOD LOSS:  Minimal  FLUID DEFICIT DURING HYSTEROSCOPY: 1750 ml  SPECIMEN(S):  Leiomyoma  COMPLICATIONS:  None   FINDINGS:    1.  Normal appearing vagina and cervix  2.  Uterus with small appearing ostia with possible scarring  3.  At low pressure, evidence of large anterior uterine fibroid       INDICATION(S):  TRENEE IGOE is a [id number] with abnormal uterine bleeding - leiomyoma.  All the risks, benefits and alternative treatments were discussed.  She signed a consent form.        DETAILS OF PROCEDURE(S):  The patient was met in the preoperative area where all questions were answered.  Her consent form was reviewed.  The patient was then taken by the anesthesiologist to the operating room.  She was placed under anesthesia.  Once this was found to be adequate, she was prepped and draped in the normal sterile fashion in dorsal lithotomy position. A speculum was then placed into the vagina in order to visualize the cervix.  A single-tooth tenaculum was placed on the anterior lip of the cervix and the cervix was dilated.  The cervix was dilated to an appropriate size to accommodate the myosure hysterscope.      The hysteroscope was then passed under direct visualization into the uterine cavity, and the uterine cavity was noted to have the above mentioned findings.    The morcellation device was then passed through the operative scope under direct visualization.  At high  pressure, the uterine cavity overall looked normal.  However, at low pressure, a large anterior fibroid was visualized.  Using proper orientation, the Myosure device was used to begin the myomectomy.  Secondary to bleeding, this procedure was stopped and vasopressin was obtained to inject.  Nonetheless, when the hysteroscope was replaced, adequate visualization could not be obtained.  A second hysteroscope was obtained and multiple attempts at flushing out the uterine cavity were attempted, however visualization remained poor.  Given increasing fluid deficit, the decision was made to abandon the procedure.    Attention to the uterine fundus and side walls were maintained during the procedure to reduce the risk of perforation.  The hysteroscope removed from the uterus.  Uterine pressure remained adequate throughout.      The cervix was visualized and noted to have no active bleeding from the cervical os.  The single-tooth tenaculum was removed from the patient's cervix and hemostasis was achieved. The speculum was removed.  The patient was awakened from anesthesia.  All sponge, lap, needle, and instrument counts were correct at the end of the procedure. She did not receive any antibiotics prior to beginning the procedure and was taken to the recovery room in stable condition.  She will return to the  office in one - two weeks for follow up.    Washington Whedbee Garlan Fair, MD

## 2021-09-20 NOTE — Unmapped (Signed)
Anesthesia Extubation Criteria:    Airway Device: laryngeal mask airway    Emergence Details:      Smooth      _x_      Stormy       __       Prolonged   __     Extubation Criteria:      Motor strength intact       _x_      Follows commands        _x_      Good airway reflexes      _x_      OP suctioned                  _x_        Follows commands:  Yes     Patient extubated:  Yes

## 2021-09-20 NOTE — Unmapped (Signed)
Sliding scale insulin ordered pre op per Dr Andrey CampanileWilson for BS 212.

## 2021-09-20 NOTE — Unmapped (Signed)
Pharmacy Meds to [facility]  [place] [facility]    The following prescriptions for outpatient use have been delivered to [facility] room:  acetaminophen (TYLENOL) 325 MG tablet    ibuprofen (MOTRIN) 600 MG tablet        Other notes:      Please contact outpatient pharmacy Monday-Friday 0900-1700 at [phone] with any questions or concerns.    Sheralyn Boatman, PharmD  09/20/2021 4:27 PM  [phone]

## 2021-09-21 NOTE — Unmapped (Signed)
Called pt and reviewed surgical findings and surgery procedure.  Needs post-op in 2 weeks.  All questions answered.    Possibly desires abdominal myomectomy to remove large fibroid. Will discuss more at follow-up.

## 2021-09-21 NOTE — Unmapped (Signed)
Spoke with patient regarding [place] [address] [place] She is going to check her calendar and call back to schedule.

## 2021-09-21 NOTE — Unmapped (Signed)
Patient is calling into triage with questions concerning the procedure that she had done yesterday. She would like to speak with Dr.Hurley about the procedure since she was still out of it afterwards from the anesthesia.

## 2021-10-05 MED ORDER — LEVEMIR FLEXTOUCH 100 UNIT/ML SC SOPN
100 | SUBCUTANEOUS | 3 refills | 38.50000 days | Status: DC
Start: 2021-10-05 — End: 2021-12-20

## 2021-10-05 NOTE — Telephone Encounter (Signed)
Requested Prescriptions     Pending Prescriptions Disp Refills    LEVEMIR FLEXTOUCH 100 UNIT/ML injection pen [Pharmacy Med Name: LEVEMIR FLEXPEN 100 UNIT/ML] 6 mL      Sig: INJECT 20 UNITS UNDER THE SKIN NIGHTLY          Last Office Visit: 09/11/2021     Next Office Visit: 12/20/2021    Last Labs:  08/29/2021

## 2021-10-08 DIAGNOSIS — M3322 Polymyositis with myopathy: Secondary | ICD-10-CM | POA: Diagnosis not present

## 2021-10-09 DIAGNOSIS — M3322 Polymyositis with myopathy: Secondary | ICD-10-CM | POA: Diagnosis not present

## 2021-10-15 DIAGNOSIS — M351 Other overlap syndromes: Secondary | ICD-10-CM | POA: Diagnosis not present

## 2021-10-15 DIAGNOSIS — Z79899 Other long term (current) drug therapy: Secondary | ICD-10-CM | POA: Diagnosis not present

## 2021-10-17 DIAGNOSIS — M351 Other overlap syndromes: Secondary | ICD-10-CM | POA: Diagnosis not present

## 2021-10-17 DIAGNOSIS — Z79899 Other long term (current) drug therapy: Secondary | ICD-10-CM | POA: Diagnosis not present

## 2021-10-31 DIAGNOSIS — Z79899 Other long term (current) drug therapy: Secondary | ICD-10-CM | POA: Diagnosis not present

## 2021-10-31 DIAGNOSIS — M351 Other overlap syndromes: Secondary | ICD-10-CM | POA: Diagnosis not present

## 2021-11-01 NOTE — Telephone Encounter (Signed)
LOV: 09/11/2021  FOV: 12/20/2021    Requesting 90 days.

## 2021-11-02 MED ORDER — VICTOZA 18 MG/3ML SC SOPN
18 | Freq: Every day | SUBCUTANEOUS | 1 refills | 30.00000 days | Status: DC
Start: 2021-11-02 — End: 2022-01-22

## 2021-11-28 NOTE — Telephone Encounter (Signed)
Could be allergy related or a stye.  Recommend warm compresses and continue Claritin. Appointment if no better

## 2021-11-28 NOTE — Telephone Encounter (Signed)
Pt's eye is red x2days  States it's not pink eye  Itching yesterday but not today  Lower left lid is swollen   She thinks a medication interaction   Only med she's been on for less than a year is Claritin

## 2021-11-29 NOTE — Telephone Encounter (Signed)
Pt is asking if she can be re-prescribed the Zyrtec as she feels that it was working better than the Claritin. Pt then stated that after switching back to Zyrtec if her eye problem is still present, she will schedule an appointment.

## 2021-11-30 MED ORDER — CETIRIZINE HCL 10 MG PO TABS
10 | ORAL_TABLET | Freq: Every day | ORAL | 1 refills | 30.00000 days | Status: DC
Start: 2021-11-30 — End: 2022-03-12

## 2021-12-13 ENCOUNTER — Telehealth: Payer: Self-pay | Admitting: *Deleted

## 2021-12-13 NOTE — Patient Outreach (Signed)
  Care Coordination   12/13/2021 Name: Jeanne Hawkins MRN: [id number] DOB: 1986/02/17   Care Coordination Outreach Attempts:  An unsuccessful telephone outreach was attempted today to offer the patient information about available care coordination services as a benefit of their health plan.   Follow Up Plan:  Additional outreach attempts will be made to offer the patient care coordination information and services.   Encounter Outcome:  No Answer  Care Coordination Interventions Activated:  No   Care Coordination Interventions:  No, not indicated    Irving Shows [facility], BSN [facility] RN Care Coordinator [phone]

## 2021-12-14 ENCOUNTER — Telehealth: Payer: Self-pay | Admitting: *Deleted

## 2021-12-14 ENCOUNTER — Encounter

## 2021-12-14 NOTE — Patient Outreach (Signed)
  Care Coordination   12/14/2021 Name: Jeanne Hawkins MRN: [id number] DOB: [birth year]   Care Coordination Outreach Attempts:  A second unsuccessful outreach was attempted today to offer the patient with information about available care coordination services as a benefit of their health plan.     Follow Up Plan:  Additional outreach attempts will be made to offer the patient care coordination information and services.   Encounter Outcome:  No Answer  Care Coordination Interventions Activated:  No   Care Coordination Interventions:  No, not indicated    Irving Shows [facility], BSN [facility] RN Care Coordinator [phone]

## 2021-12-18 ENCOUNTER — Telehealth: Payer: Self-pay | Admitting: *Deleted

## 2021-12-18 ENCOUNTER — Ambulatory Visit: Payer: PRIVATE HEALTH INSURANCE | Primary: Oncology

## 2021-12-18 NOTE — Patient Outreach (Signed)
  Care Coordination   12/18/2021 Name: Jeanne Hawkins MRN: [id number] DOB: December 09, 1985   Care Coordination Outreach Attempts:  A third unsuccessful outreach was attempted today to offer the patient with information about available care coordination services as a benefit of their health plan.   Follow Up Plan:  No further outreach attempts will be made at this time. We have been unable to contact the patient to offer or enroll patient in care coordination services  Encounter Outcome:  No Answer  Care Coordination Interventions Activated:  No   Care Coordination Interventions:  No, not indicated    Irving Shows [facility], BSN [facility] RN Care Coordinator [phone]

## 2021-12-20 ENCOUNTER — Ambulatory Visit: Admit: 2021-12-20 | Discharge: 2021-12-20 | Payer: PRIVATE HEALTH INSURANCE | Attending: Oncology | Primary: Oncology

## 2021-12-20 DIAGNOSIS — M3322 Polymyositis with myopathy: Secondary | ICD-10-CM | POA: Diagnosis not present

## 2021-12-20 DIAGNOSIS — Z794 Long term (current) use of insulin: Secondary | ICD-10-CM

## 2021-12-20 LAB — POCT GLYCOSYLATED HEMOGLOBIN (HGB A1C): Hemoglobin A1C: 8.2 %

## 2021-12-20 MED ORDER — SKIN PREP WIPES MISC
2 refills | 54.50000 days | Status: DC
Start: 2021-12-20 — End: 2022-05-22

## 2021-12-20 MED ORDER — INSULIN DETEMIR 100 UNIT/ML SC SOPN
100 | SUBCUTANEOUS | 3 refills | 38.50000 days | Status: DC
Start: 2021-12-20 — End: 2022-04-23

## 2021-12-20 MED ORDER — INSULIN LISPRO (1 UNIT DIAL) 100 UNIT/ML SC SOPN
100 | SUBCUTANEOUS | 2 refills | 75.00000 days | Status: DC
Start: 2021-12-20 — End: 2021-12-25

## 2021-12-20 NOTE — Assessment & Plan Note (Signed)
A1c 8.2  She will check with the pharmacy on her Ozempic  We will increase Levemir to 30 units  Start Humalog at 10 units with each meal plus sliding scale  Discussed diet  Encourage compliance with medications and monitoring blood sugars  We will trial skin prep prior to putting the sensor for adherence  She has surgery in October and will need to have better control of her blood sugars prior to clearance

## 2021-12-20 NOTE — Progress Notes (Signed)
Shelly Richard (DOB:  1985/04/20) is a 36 y.o. female,Established patient, here for evaluation of the following chief complaint(s):  Diabetes      ASSESSMENT/PLAN:  1. Type 2 diabetes mellitus without complication, with long-term current use of insulin (HCC)  Assessment & Plan:  A1c 8.2  She will check with the pharmacy on her Ozempic  We will increase Levemir to 30 units  Start Humalog at 10 units with each meal plus sliding scale  Discussed diet  Encourage compliance with medications and monitoring blood sugars  We will trial skin prep prior to putting the sensor for adherence  She has surgery in October and will need to have better control of her blood sugars prior to clearance  Orders:  -     POCT glycosylated hemoglobin (Hb A1C)      No follow-ups on file.    SUBJECTIVE/OBJECTIVE:  Diabetes  Pertinent negatives for hypoglycemia include no confusion, dizziness, headaches, nervousness/anxiousness or pallor. Pertinent negatives for diabetes include no chest pain, no fatigue and no weakness.   Here for follow-up of her diabetes.  She is not tracking her blood sugars.  She does have CGM at home but states her sensors keep falling off.  She is not checking her blood sugars with fingersticks either.  She does report compliance with her medications except she states that she is only taking her Humalog about once a day in the morning.  She takes 20 units in the morning.  She denies any hypoglycemia symptoms.  She does take metformin and [place].  She does admit her diet has been very poor lately.  She has not felt her Ozempic.  She did not hear from the pharmacy as to whether or not this has been ready for pickup and she has not checked with the pharmacy to follow-up on this.    Current Outpatient Medications   Medication Sig Dispense Refill    Ostomy Supplies (SKIN PREP WIPES) MISC 50 each by Does not apply route every 14 days 50 each 2    insulin lispro, 1 Unit Dial, (HUMALOG KWIKPEN) 100 UNIT/ML SOPN Inject 10  units with meals plus Inject 2 units for BS 150-200, 4 units for BS 201-250, 6 units for BS 251-300, 8 units for BS 301-350, 10 units for BS greater than 350 with meals 4 Adjustable Dose Pre-filled Pen Syringe 2    insulin detemir (LEVEMIR FLEXTOUCH) 100 UNIT/ML injection pen INJECT 30 UNITS UNDER THE SKIN NIGHTLY 6 mL 3    cetirizine (ZYRTEC) 10 MG tablet Take 1 tablet by mouth daily 30 tablet 1    Liraglutide (VICTOZA) 18 MG/3ML SOPN SC injection Inject 1.2 mg into the skin daily 2 Adjustable Dose Pre-filled Pen Syringe 1    Semaglutide,0.25 or 0.5MG/DOS, (OZEMPIC, 0.25 OR 0.5 MG/DOSE,) 2 MG/3ML SOPN Inject 0.5 mg into the skin once a week 3 mL 0    erythromycin (ROMYCIN) 5 MG/GM ophthalmic ointment ~1 cm ribbon into affected eye(s) 4 times daily 3.5 g 0    FEROSUL 325 (65 Fe) MG tablet TAKE ONE TABLET BY MOUTH DAILY WITH BREAKFAST 30 tablet 2    letrozole (FEMARA) 2.5 MG tablet       norethindrone (AYGESTIN) 5 MG tablet       losartan (COZAAR) 25 MG tablet TAKE ONE TABLET BY MOUTH DAILY 90 tablet 1    metFORMIN (GLUCOPHAGE) 500 MG tablet TAKE TWO TABLETS BY MOUTH TWICE A DAY WITH MEALS 270 tablet 1    Cholecalciferol (D3)  50 MCG (2000 UT) TABS TAKE ONE TABLET BY MOUTH DAILY 30 tablet 3    Insulin Pen Needle 32G X 6 MM MISC Use four times daily and as needed 100 each 5    Insulin Pen Needle 32G X 6 MM MISC Once daily 90 each 2    simvastatin (ZOCOR) 40 MG tablet TAKE ONE TABLET BY MOUTH DAILY 90 tablet 1    levothyroxine (SYNTHROID) 75 MCG tablet TAKE ONE TABLET BY MOUTH DAILY 90 tablet 1    FARXIGA 10 MG tablet TAKE ONE TABLET BY MOUTH EVERY MORNING 90 tablet 1    Continuous Blood Gluc Sensor (FREESTYLE LIBRE 2 SENSOR) MISC Use as directed 6 each 1    Continuous Blood Gluc Sensor (FREESTYLE LIBRE 3 SENSOR) MISC Check glucose per diagnosis E11.9 6 each 3    EPINEPHrine (EPIPEN) 0.3 MG/0.3ML SOAJ injection       blood glucose monitor strips Tes2t qam  times a day & as needed for symptoms of irregular blood glucose.  Please provide test strips approved by insurance. 100 strip 2    montelukast (SINGULAIR) 10 MG tablet TAKE ONE TABLET BY MOUTH DAILY 30 tablet 1    albuterol sulfate HFA 108 (90 Base) MCG/ACT inhaler Inhale 2 puffs into the lungs every 6 hours as needed for Wheezing or Shortness of Breath 1 Inhaler 5    Insulin Pen Needle ([employer] PEN NEEDLES 31G) 31G X 8 MM MISC 1 each by Does not apply route daily 100 each 3    Insulin Pen Needle ([employer] PEN NEEDLES 29G) 29G X 12MM MISC 1 each by Does not apply route daily 100 each 3    valACYclovir (VALTREX) 500 MG tablet TAKE 1 TABLET BY MOUTH DAILY 90 tablet 5    Lancets MISC 1 each by Does not apply route daily Please provide lancets approved by insurance. 100 each 3    Blood Glucose Monitoring Suppl (ACURA BLOOD GLUCOSE METER) w/Device KIT Please provide meter approved by insurance.  Check bs qam 1 kit 0    Lactobacillus (ACIDOPHILUS) TABS TAKE 1 TABLET BY MOUTH EVERY DAY 30 tablet 5    ibuprofen (ADVIL;MOTRIN) 600 MG tablet Take 1 tablet by mouth every 6 hours as needed for Pain 30 tablet 2    rizatriptan (MAXALT) 10 MG tablet Take 1 tablet by mouth once as needed for Migraine May repeat in 2 hours if needed 30 tablet 3     No current facility-administered medications for this visit.       Review of Systems   Constitutional:  Negative for chills, fatigue and fever.   HENT:  Negative for congestion, sinus pressure and sinus pain.    Respiratory:  Negative for cough, shortness of breath and wheezing.    Cardiovascular:  Negative for chest pain and palpitations.   Gastrointestinal:  Negative for abdominal pain, constipation and diarrhea.   Musculoskeletal:  Negative for arthralgias, back pain and myalgias.   Skin:  Negative for color change, pallor and rash.   Neurological:  Negative for dizziness, syncope, weakness, light-headedness and headaches.   Psychiatric/Behavioral:  Negative for behavioral problems, confusion and sleep disturbance. The patient is not nervous/anxious.       Vitals:    12/20/21 1603   BP: 110/80   Site: Left Upper Arm   Position: Sitting   Cuff Size: Large Adult   Pulse: 100   Temp: 98.5 F (36.9 C)   SpO2: 97%   Weight: 201 lb (91.2 kg)  Height: '5\' 4"'  (1.626 m)       Physical Exam  Constitutional:       Appearance: Normal appearance.   HENT:      Head: Normocephalic and atraumatic.   Eyes:      Extraocular Movements: Extraocular movements intact.   Pulmonary:      Effort: Pulmonary effort is normal.   Musculoskeletal:      Cervical back: Normal range of motion.   Skin:     Coloration: Skin is not jaundiced or pale.   Neurological:      General: No focal deficit present.      Mental Status: She is alert and oriented to person, place, and time.   Psychiatric:         Mood and Affect: Mood normal.         Behavior: Behavior normal.         Thought Content: Thought content normal.               An electronic signature was used to authenticate this note.    --Herbert Deaner, [name]

## 2021-12-21 DIAGNOSIS — Z01818 Encounter for other preprocedural examination: Secondary | ICD-10-CM

## 2021-12-21 DIAGNOSIS — D259 Leiomyoma of uterus, unspecified: Secondary | ICD-10-CM

## 2021-12-21 NOTE — Telephone Encounter (Signed)
Submitted PA for Ozempic (0.25 or 0.5 MG/DOSE) '2MG'$ /3ML pen-injectors  Via CMM Key: [id number] STATUS: PENDING.    Follow up done daily; if no response in three days we will refax for status check.  If another three days goes by with no response we will call the insurance for status.

## 2021-12-22 ENCOUNTER — Inpatient Hospital Stay: Admit: 2021-12-22 | Payer: PRIVATE HEALTH INSURANCE | Primary: Oncology

## 2021-12-22 MED ORDER — GADOTERIDOL 279.3 MG/ML IV SOLN
279.3 MG/ML | Freq: Once | INTRAVENOUS | Status: AC | PRN
Start: 2021-12-22 — End: 2021-12-21
  Administered 2021-12-22: 01:00:00 20 mL via INTRAVENOUS

## 2021-12-24 DIAGNOSIS — M3322 Polymyositis with myopathy: Secondary | ICD-10-CM | POA: Diagnosis not present

## 2021-12-24 NOTE — Telephone Encounter (Signed)
LOV: 12/20/2021  FOV: 01/22/2022

## 2021-12-25 DIAGNOSIS — M3322 Polymyositis with myopathy: Secondary | ICD-10-CM | POA: Diagnosis not present

## 2021-12-25 MED ORDER — DAPAGLIFLOZIN PROPANEDIOL 10 MG PO TABS
10 | ORAL_TABLET | Freq: Every morning | ORAL | 1 refills | Status: DC
Start: 2021-12-25 — End: 2022-05-22

## 2021-12-25 MED ORDER — FREESTYLE LIBRE 2 SENSOR MISC
2 refills | 20.00000 days | Status: DC
Start: 2021-12-25 — End: 2024-04-20

## 2021-12-25 MED ORDER — OZEMPIC (0.25 OR 0.5 MG/DOSE) 2 MG/3ML SC SOPN
2 | SUBCUTANEOUS | 0 refills | 30.00000 days | Status: DC
Start: 2021-12-25 — End: 2022-01-22

## 2021-12-25 MED ORDER — SIMVASTATIN 40 MG PO TABS
40 | ORAL_TABLET | ORAL | 1 refills | Status: DC
Start: 2021-12-25 — End: 2022-05-22

## 2021-12-25 MED ORDER — INSULIN LISPRO (1 UNIT DIAL) 100 UNIT/ML SC SOPN
100 | SUBCUTANEOUS | 2 refills | 75.00000 days | Status: DC
Start: 2021-12-25 — End: 2022-04-30

## 2021-12-25 MED ORDER — FREESTYLE LIBRE 3 SENSOR MISC
3 refills | Status: DC
Start: 2021-12-25 — End: 2023-11-25

## 2021-12-25 NOTE — Telephone Encounter (Signed)
Received APPROVAL for Ozempic (0.25 or 0.5 MG/DOSE) '2MG'$ /3ML pen-injectors from 12/24/21 - 12/23/22; approval letter attached.    If this requires a response please respond to the pool ( P [facility] [place]).      Thank you please advise patient.

## 2021-12-25 NOTE — Telephone Encounter (Signed)
From: Sharla Kidney  To: Herbert Deaner  Sent: 12/24/2021 11:12 AM EDT  Subject: Medication     Good morning,      Did you ever send over the medications that we talked about at my last appointment? The pharmacy sent over some of the medications I need refilled. I need a prior authorization for Ozempic. They said they sent one to the office back in May. Just let me know

## 2022-01-16 NOTE — Telephone Encounter (Signed)
LOV 12/20/21

## 2022-01-17 MED ORDER — VALACYCLOVIR HCL 500 MG PO TABS
500 | ORAL_TABLET | Freq: Two times a day (BID) | ORAL | 5 refills | 30.00000 days | Status: AC
Start: 2022-01-17 — End: 2022-01-22

## 2022-01-22 ENCOUNTER — Encounter: Admit: 2022-01-22 | Discharge: 2022-01-22 | Payer: PRIVATE HEALTH INSURANCE | Attending: Oncology | Primary: Oncology

## 2022-01-22 DIAGNOSIS — E119 Type 2 diabetes mellitus without complications: Secondary | ICD-10-CM

## 2022-01-22 LAB — POCT GLYCOSYLATED HEMOGLOBIN (HGB A1C): Hemoglobin A1C: 7.4 %

## 2022-01-22 MED ORDER — OZEMPIC (1 MG/DOSE) 4 MG/3ML SC SOPN
4 | SUBCUTANEOUS | 1 refills | 30.00000 days | Status: AC
Start: 2022-01-22 — End: 2022-02-21

## 2022-01-22 NOTE — Assessment & Plan Note (Signed)
Perioperative risk related to the patient's upcoming surgery is considered low. she is cleared for surgery.  Pre-op exam was completed on 01/22/22 4:25 PM.

## 2022-01-22 NOTE — Assessment & Plan Note (Signed)
Well-controlled, continue current medications

## 2022-01-22 NOTE — Assessment & Plan Note (Signed)
Following with GYN  Surgery as planned

## 2022-01-22 NOTE — Assessment & Plan Note (Signed)
Not at goal  Improving  A1c 7.4  We will increase Ozempic to 1 mg  Change Levemir to twice daily  Continue Humalog per sliding scale  Needs more consistency with diet-offered referral to diabetes dietitian.  Declines for now   Follow-up in 4 months

## 2022-01-22 NOTE — Progress Notes (Signed)
Subjective:     Shelly Richard who presentsto the office today for a preoperative consultation at the request of surgeon Dr.  Raelyn Number who plans on performing ROBOTIC MYOMECTOMY on 02/01/22. Planned anesthesia isGeneral.  The patient has the following known anesthesia issues:  none   Patient has a bleeding risk of : no recent abnormal bleeding, no remote history of abnormal bleeding.  Patient's medications, allergies, past medical, surgical,social and family histories were reviewed and updated as appropriate.    She is also here to follow-up on diabetes.  She admits that her diet has been very inconsistent lately.  She is eating late due to working late often.  Blood sugars are high overnight.  Ranging from 100-250.  She does report compliance with Levemir and Humalog  She is tolerating Ozempic well.    Past Medical History:   Diagnosis Date   . Allergies    . Asthma    . Diabetes mellitus ([place])    . Genital herpes    . HTN (hypertension)    . Hyperlipidemia    . Hypothyroid    . Urticaria      Past Surgical History:   Procedure Laterality Date   . CYST REMOVAL       Family History   Problem Relation Age of Onset   . Diabetes Mother    . Diabetes Father      Social History     Socioeconomic History   . Marital status: Single     Spouse name: Not on file   . Number of children: Not on file   . Years of education: Not on file   . Highest education level: Not on file   Occupational History   . Not on file   Tobacco Use   . Smoking status: Never   . Smokeless tobacco: Never   Vaping Use   . Vaping Use: Never used   Substance and Sexual Activity   . Alcohol use: No   . Drug use: No   . Sexual activity: Yes     Partners: Male   Other Topics Concern   . Not on file   Social History Narrative   . Not on file     Social Determinants of Health     Financial Resource Strain: Low Risk  (06/07/2021)    Overall Financial Resource Strain (CARDIA)    . Difficulty of Paying Living Expenses: Not hard at all   Food Insecurity: No Food  Insecurity (06/07/2021)    Hunger Vital Sign    . Worried About Charity fundraiser in the Last Year: Never true    . Ran Out of Food in the Last Year: Never true   Transportation Needs: Unknown (06/07/2021)    PRAPARE - Transportation    . Lack of Transportation (Medical): Not on file    . Lack of Transportation (Non-Medical): No   Physical Activity: Not on file   Stress: Not on file   Social Connections: Not on file   Intimate Partner Violence: Not on file   Housing Stability: Unknown (06/07/2021)    Housing Stability Vital Sign    . Unable to Pay for Housing in the Last Year: Not on file    . Number of Places Lived in the Last Year: Not on file    . Unstable Housing in the Last Year: No       Review of Systems  Review of Systems   Constitutional:  Negative for chills,  fatigue and fever.   HENT:  Negative for congestion, sinus pressure and sinus pain.    Respiratory:  Negative for cough, shortness of breath and wheezing.    Cardiovascular:  Negative for chest pain and palpitations.   Gastrointestinal:  Negative for abdominal pain, constipation and diarrhea.   Musculoskeletal:  Negative for arthralgias, back pain and myalgias.   Skin:  Negative for color change, pallor and rash.   Neurological:  Negative for dizziness, syncope, weakness, light-headedness and headaches.   Psychiatric/Behavioral:  Negative for behavioral problems, confusion and sleep disturbance. The patient is not nervous/anxious.             Objective:     Vitals:    01/22/22 1554   BP: 110/68   Pulse: 98   Temp: 98.2 F (36.8 C)   SpO2: 99%        Physical Exam  Physical Exam  Constitutional:       Appearance: She is well-developed. She is obese.   HENT:      Head: Normocephalic and atraumatic.   Eyes:      Conjunctiva/sclera: Conjunctivae normal.      Pupils: Pupils are equal, round, and reactive to light.   Neck:      Thyroid: No thyromegaly.      Vascular: No JVD.   Cardiovascular:      Rate and Rhythm: Normal rate and regular rhythm.      Heart  sounds: Normal heart sounds.   Pulmonary:      Effort: Pulmonary effort is normal. No respiratory distress.      Breath sounds: Normal breath sounds. No wheezing.   Musculoskeletal:         General: Normal range of motion.      Cervical back: Normal range of motion and neck supple.   Skin:     General: Skin is warm and dry.      Capillary Refill: Capillary refill takes less than 2 seconds.   Neurological:      Mental Status: She is alert and oriented to person, place, and time.   Psychiatric:         Behavior: Behavior normal.       Lab Review   Lab Results   Component Value Date/Time    NA 138 08/29/2021 04:09 PM    K 4.2 08/29/2021 04:09 PM    CL 103 08/29/2021 04:09 PM    CO2 22 08/29/2021 04:09 PM    BUN 9 08/29/2021 04:09 PM    CREATININE 0.6 08/29/2021 04:09 PM    GLUCOSE 229 08/29/2021 04:09 PM    CALCIUM 9.8 08/29/2021 04:09 PM     Lab Results   Component Value Date/Time    WBC 10.8 08/29/2021 04:09 PM    HGB 13.6 08/29/2021 04:09 PM    HCT 40.7 08/29/2021 04:09 PM    MCV 89.4 08/29/2021 04:09 PM    PLT 314 08/29/2021 04:09 PM     Hemoglobin A1C   Date Value Ref Range Status   01/22/2022 7.4 % Final          Medication Review  Current Outpatient Medications on File Prior to Visit   Medication Sig Dispense Refill   . MYFEMBREE 40-1-0.5 MG TABS Take by mouth daily     . Continuous Blood Gluc Sensor (FREESTYLE LIBRE 2 SENSOR) MISC USE TO TEST BLOOD GLUCOSE AS DIRECTED 14 each 2   . simvastatin (ZOCOR) 40 MG tablet TAKE ONE TABLET BY MOUTH DAILY 90 tablet  1   . dapagliflozin (FARXIGA) 10 MG tablet Take 1 tablet by mouth every morning 90 tablet 1   . insulin lispro, 1 Unit Dial, (HUMALOG KWIKPEN) 100 UNIT/ML SOPN Inject 10 units with meals plus Inject 2 units for BS 150-200, 4 units for BS 201-250, 6 units for BS 251-300, 8 units for BS 301-350, 10 units for BS greater than 350 with meals 4 Adjustable Dose Pre-filled Pen Syringe 2   . insulin detemir (LEVEMIR FLEXTOUCH) 100 UNIT/ML injection pen INJECT 30 UNITS  UNDER THE SKIN NIGHTLY 6 mL 3   . cetirizine (ZYRTEC) 10 MG tablet Take 1 tablet by mouth daily 30 tablet 1   . FEROSUL 325 (65 Fe) MG tablet TAKE ONE TABLET BY MOUTH DAILY WITH BREAKFAST 30 tablet 2   . letrozole (FEMARA) 2.5 MG tablet      . losartan (COZAAR) 25 MG tablet TAKE ONE TABLET BY MOUTH DAILY 90 tablet 1   . metFORMIN (GLUCOPHAGE) 500 MG tablet TAKE TWO TABLETS BY MOUTH TWICE A DAY WITH MEALS 270 tablet 1   . Cholecalciferol (D3) 50 MCG (2000 UT) TABS TAKE ONE TABLET BY MOUTH DAILY 30 tablet 3   . Insulin Pen Needle 32G X 6 MM MISC Use four times daily and as needed 100 each 5   . Insulin Pen Needle 32G X 6 MM MISC Once daily 90 each 2   . levothyroxine (SYNTHROID) 75 MCG tablet TAKE ONE TABLET BY MOUTH DAILY 90 tablet 1   . EPINEPHrine (EPIPEN) 0.3 MG/0.3ML SOAJ injection      . blood glucose monitor strips Tes2t qam  times a day & as needed for symptoms of irregular blood glucose. Please provide test strips approved by insurance. 100 strip 2   . montelukast (SINGULAIR) 10 MG tablet TAKE ONE TABLET BY MOUTH DAILY 30 tablet 1   . albuterol sulfate HFA 108 (90 Base) MCG/ACT inhaler Inhale 2 puffs into the lungs every 6 hours as needed for Wheezing or Shortness of Breath 1 Inhaler 5   . Insulin Pen Needle ([employer] PEN NEEDLES 31G) 31G X 8 MM MISC 1 each by Does not apply route daily 100 each 3   . Insulin Pen Needle ([employer] PEN NEEDLES 29G) 29G X 12MM MISC 1 each by Does not apply route daily 100 each 3   . valACYclovir (VALTREX) 500 MG tablet TAKE 1 TABLET BY MOUTH DAILY 90 tablet 5   . Lancets MISC 1 each by Does not apply route daily Please provide lancets approved by insurance. 100 each 3   . Blood Glucose Monitoring Suppl (ACURA BLOOD GLUCOSE METER) w/Device KIT Please provide meter approved by insurance.  Check bs qam 1 kit 0   . ibuprofen (ADVIL;MOTRIN) 600 MG tablet Take 1 tablet by mouth every 6 hours as needed for Pain 30 tablet 2   . valACYclovir (VALTREX) 500 MG tablet Take 1 tablet by mouth 2  times daily for 5 days (Patient not taking: Reported on 01/22/2022) 10 tablet 5   . Continuous Blood Gluc Sensor (FREESTYLE LIBRE 3 SENSOR) MISC Check glucose per diagnosis E11.9 (Patient not taking: Reported on 01/22/2022) 6 each 3   . Ostomy Supplies (SKIN PREP WIPES) MISC 50 each by Does not apply route every 14 days 50 each 2   . norethindrone (AYGESTIN) 5 MG tablet  (Patient not taking: Reported on 01/22/2022)     . Continuous Blood Gluc Sensor (FREESTYLE LIBRE 3 SENSOR) MISC Check glucose per diagnosis E11.9 (Patient not  taking: Reported on 01/22/2022) 6 each 3   . Lactobacillus (ACIDOPHILUS) TABS TAKE 1 TABLET BY MOUTH EVERY DAY (Patient not taking: Reported on 01/22/2022) 30 tablet 5     No current facility-administered medications on file prior to visit.       Assessment:       1. Type 2 diabetes mellitus without complication, with long-term current use of insulin ([place])    2. Uterine leiomyoma, unspecified location    3. Mild intermittent asthma without complication    4. Essential hypertension    5. Class 1 obesity due to excess calories with serious comorbidity and body mass index (BMI) of 33.0 to 33.9 in adult    6. Preop examination           Plan:        Type 2 diabetes mellitus without complication, with long-term current use of insulin (HCC)  Not at goal  Improving  A1c 7.4  We will increase Ozempic to 1 mg  Change Levemir to twice daily  Continue Humalog per sliding scale  Needs more consistency with diet-offered referral to diabetes dietitian.  Declines for now   Follow-up in 4 months    Uterine fibroid  Following with GYN  Surgery as planned    Mild intermittent asthma without complication   Well-controlled, continue current medications    Essential hypertension   Well-controlled, continue current medications    Class 1 obesity due to excess calories with serious comorbidity and body mass index (BMI) of 33.0 to 33.9 in adult  Not at goal  Discussed diet  Increase exercise  Increase Ozempic to 1  mg    Preop examination  Perioperative risk related to the patient's upcoming surgery is considered low. she is cleared for surgery.  Pre-op exam was completed on 01/22/22 4:25 PM.

## 2022-01-22 NOTE — Assessment & Plan Note (Signed)
Not at goal  Discussed diet  Increase exercise  Increase Ozempic to 1 mg

## 2022-01-23 NOTE — Telephone Encounter (Signed)
Received APPROVAL for Ozempic (1 MG/DOSE) '4MG'$ /3ML pen-injectors from 01/23/22 - 01/22/23; approval letter attached.    If this requires a response please respond to the pool ( P [facility] [place]).      Thank you please advise patient.

## 2022-01-23 NOTE — Telephone Encounter (Signed)
Submitted PA for Ozempic (1 MG/DOSE) '4MG'$ /3ML pen-injectors  Via CMM Key: [id number] STATUS: PENDING.    Follow up done daily; if no response in three days we will refax for status check.  If another three days goes by with no response we will call the insurance for status.

## 2022-01-24 NOTE — Progress Notes (Signed)
Name_______________________________________Printed:____________________  Date and time of surgery__10/20/23 @ 0730______________________Arrival Time:___0600 main hosp_____________   1. The instructions given regarding when and if a patient needs to stop oral intake prior to surgery varies.Follow the specific instructions you were given                  _x__Nothing to eat or to drink after Midnight the night before.                   ____Carbo loading or instructions will be given to select patients-if you have been given those instructions -please do the following                           The evening before your surgery after dinner before midnight drink 40 ounces of gatorade.If you are diabetic use sugar free.  The morning of surgery drink 40 ounces of water.This needs to be finished 3 hours prior to your surgery start time.    2. Take the following pills with a small sip of water on the morning of surgery_____synthroid______________________________________________                  Do not take blood pressure medications ending in pril or sartan the eve prior to surgery or the morning of surgery. Dr Cherlynn Polo patient are not to take any medications the AM of surgery.         3. Aspirin, Ibuprofen, Advil, Naproxen, Vitamin E and other Anti-inflammatory products and supplements should be stopped for 5 -7days before surgery or as directed by your physician.   4. Check with your Doctor regarding stopping Plavix, Coumadin,Eliquis, Lovenox,Effient,Pradaxa,Xarelto, Fragmin or other blood thinners and follow their instructions.   5. Do not smoke, and do not drink any alcoholic beverages 24 hours prior to surgery.  This includes NA Beer.Refrain from the usage of any recreational drugs.   6. You may brush your teeth and gargle the morning of surgery.  DO NOT SWALLOW WATER   7. You MUST make arrangements for a responsible adult to stay on site while you are here and take you home after your surgery. You will not be allowed to  leave alone or drive yourself home.  It is strongly suggested someone stay with you the first 24 hrs. Your surgery will be cancelled if you do not have a ride home.   8. A parent/legal guardian must accompany a child scheduled for surgery and plan to stay at the hospital until the child is discharged.  Please do not bring other children with you.   9. Please wear simple, loose fitting clothing to the hospital.  Do not bring valuables (money, credit cards, checkbooks, etc.) Do not wear any makeup (including no eye makeup) or nail polish on your fingers or toes.             10. DO NOT wear any jewelry or piercings on day of surgery.  All body piercing jewelry must be removed.             11. If you have ___dentures, they will be removed before going to the OR; we will provide you a container.  If you wear ___contact lenses or _x__glasses, they will be removed; please bring a case for them.             12. Please see your family doctor/pediatrician for a history & physical and/or concerning medications.  Bring any test results/reports from  your physician's office.   PCP___foley_______________Phone___________H&P Appt. Date________             13 If you  have a Living Will and Durable Power of Attorney for Healthcare, please bring in a copy.             55. Notify your Surgeon if you develop any illness between now and surgery  time, cough, cold, fever, sore throat, nausea, vomiting, etc.  Please notify your surgeon if you experience dizziness, shortness of breath or blurred vision between now & the time of your surgery             15. DO NOT shave your operative site 96 hours prior to surgery. For face & neck surgery, men may use an electric razor 48 hours prior to surgery.             16. Shower the night before or morning of surgery using an antibacterial soap or as you have been instructed.             17. To provide excellent care visitors will be limited to one in the room at any given time.             18.  Please  bring picture ID and insurance card.             19.  Visit our web site for additional information:  [email or web address]              20.During flu season no children under the age of 23 are permitted in the hospital for the safety of all patients.                              21. If you take a long acting insulin in the evening only  take half of your usual  dose the night  before your procedure              22. If you use a c-pap please bring DOS if staying overnight,             23.For your convenience [facility] has a pharmacy on site to fill your prescriptions.             24. If you use oxygen and have a portable tank please bring it  with you the DOS             25. Bring a complete list of all your medications with name and dose include any supplements.             26. Other__________________________________________   *Please call pre admission testing if you any further questions   [facility] S[address]         [address]    [place] [place]  [phone]   [place]       VISITOR POLICY(subject to change)    Current policy is 2 visitors per patient. No children. Mask is  at the discretion of the facility. Visiting hours are 8a-8p. Overnight visitors will be at the discretion of the nurse. All policies subject to change.      All above information reviewed with patient in person or by phone.Patient verbalizes understanding.All questions and concerns addressed.  Patient/Rep_____pt_______________                                                                                                                                    PRE OP INSTRUCTIONS

## 2022-01-24 NOTE — Telephone Encounter (Signed)
Pt was advised.

## 2022-01-25 ENCOUNTER — Inpatient Hospital Stay: Payer: PRIVATE HEALTH INSURANCE | Primary: Oncology

## 2022-01-25 DIAGNOSIS — Z01818 Encounter for other preprocedural examination: Secondary | ICD-10-CM

## 2022-01-25 LAB — BASIC METABOLIC PANEL
Anion Gap: 9 (ref 3–16)
BUN: 14 mg/dL (ref 7–20)
CO2: 28 mmol/L (ref 21–32)
Calcium: 9.1 mg/dL (ref 8.3–10.6)
Chloride: 105 mmol/L (ref 99–110)
Creatinine: 0.8 mg/dL (ref 0.6–1.1)
Est, Glom Filt Rate: >60 (ref >60)
Glucose: 127 mg/dL — ABNORMAL HIGH (ref 70–99)
Potassium: 4 mmol/L (ref 3.5–5.1)
Sodium: 142 mmol/L (ref 136–145)

## 2022-01-25 LAB — CBC
Hematocrit: 41.1 % (ref 36.0–48.0)
Hemoglobin: 13.9 g/dL (ref 12.0–16.0)
MCH: 29.3 pg (ref 26.0–34.0)
MCHC: 33.8 g/dL (ref 31.0–36.0)
MCV: 86.7 fL (ref 80.0–100.0)
MPV: 9.5 fL (ref 5.0–10.5)
Platelets: 278 10*3/uL (ref 135–450)
RBC: 4.74 M/uL (ref 4.00–5.20)
RDW: 14.1 % (ref 12.4–15.4)
WBC: 11.5 10*3/uL — ABNORMAL HIGH (ref 4.0–11.0)

## 2022-01-25 LAB — TYPE AND SCREEN
ABO/Rh: B POS
Antibody Screen: NEGATIVE

## 2022-02-01 ENCOUNTER — Observation Stay
Admit: 2022-02-01 | Discharge: 2022-02-02 | Disposition: A | Discharge status: Home / Self Care | Payer: PRIVATE HEALTH INSURANCE | Attending: Obstetrics & Gynecology | Admitting: Obstetrics & Gynecology

## 2022-02-01 DIAGNOSIS — Z01818 Encounter for other preprocedural examination: Secondary | ICD-10-CM

## 2022-02-01 DIAGNOSIS — D259 Leiomyoma of uterus, unspecified: Secondary | ICD-10-CM

## 2022-02-01 LAB — POCT GLUCOSE
POC Glucose: 178 mg/dl — ABNORMAL HIGH (ref 70–99)
POC Glucose: 214 mg/dl — ABNORMAL HIGH (ref 70–99)
POC Glucose: 226 mg/dl — ABNORMAL HIGH (ref 70–99)
POC Glucose: 245 mg/dl — ABNORMAL HIGH (ref 70–99)
POC Glucose: 305 mg/dl — ABNORMAL HIGH (ref 70–99)

## 2022-02-01 LAB — TYPE AND SCREEN
ABO/Rh: B POS
Antibody Screen: NEGATIVE

## 2022-02-01 LAB — PREGNANCY, URINE: Pregnancy, Urine: NEGATIVE

## 2022-02-01 MED ORDER — DEXAMETHASONE SODIUM PHOSPHATE 20 MG/5ML IJ SOLN
20 MG/5ML | INTRAMUSCULAR | Status: DC | PRN
Start: 2022-02-01 — End: 2022-02-01
  Administered 2022-02-01: 12:00:00 8 via INTRAVENOUS

## 2022-02-01 MED ORDER — MAGNESIUM HYDROXIDE 400 MG/5ML PO SUSP
400 | Freq: Every day | ORAL | Status: DC | PRN
Start: 2022-02-01 — End: 2022-02-02

## 2022-02-01 MED ORDER — SODIUM CHLORIDE 0.9 % IV SOLN
0.9 % | INTRAVENOUS | Status: DC | PRN
Start: 2022-02-01 — End: 2022-02-02

## 2022-02-01 MED ORDER — HYDROMORPHONE HCL 2 MG/ML IJ SOLN
2 MG/ML | INTRAMUSCULAR | Status: DC | PRN
Start: 2022-02-01 — End: 2022-02-01

## 2022-02-01 MED ORDER — GLUCAGON HCL (DIAGNOSTIC) 1 MG IJ SOLR
1 | INTRAMUSCULAR | Status: DC | PRN
Start: 2022-02-01 — End: 2022-02-02

## 2022-02-01 MED ORDER — BUPIVACAINE HCL (PF) 0.5 % IJ SOLN
0.5 % | Freq: Once | INTRAMUSCULAR | Status: AC | PRN
Start: 2022-02-01 — End: 2022-02-01
  Administered 2022-02-01: 13:00:00 27 via INTRADERMAL

## 2022-02-01 MED ORDER — SENNOSIDES 8.6 MG PO TABS
8.6 MG | Freq: Two times a day (BID) | ORAL | Status: DC | PRN
Start: 2022-02-01 — End: 2022-02-02

## 2022-02-01 MED ORDER — SUCCINYLCHOLINE CHLORIDE 200 MG/10ML IV SOSY
200 MG/10ML | INTRAVENOUS | Status: DC | PRN
Start: 2022-02-01 — End: 2022-02-01
  Administered 2022-02-01: 12:00:00 160 via INTRAVENOUS

## 2022-02-01 MED ORDER — INSULIN LISPRO 100 UNIT/ML IJ SOLN
100 | Freq: Three times a day (TID) | INTRAMUSCULAR | Status: DC
Start: 2022-02-01 — End: 2022-02-02
  Administered 2022-02-01: 22:00:00 4 [IU] via SUBCUTANEOUS

## 2022-02-01 MED ORDER — GLYCOPYRROLATE 1 MG/5ML IJ SOLN
1 | INTRAMUSCULAR | Status: AC
Start: 2022-02-01 — End: 2022-02-01

## 2022-02-01 MED ORDER — KETOROLAC TROMETHAMINE 60 MG/2ML IM SOLN
60 MG/2ML | Freq: Four times a day (QID) | INTRAMUSCULAR | Status: DC
Start: 2022-02-01 — End: 2022-02-01

## 2022-02-01 MED ORDER — LIDOCAINE HCL (PF) 1 % IJ SOLN
1 % | Freq: Once | INTRAMUSCULAR | Status: DC
Start: 2022-02-01 — End: 2022-02-01

## 2022-02-01 MED ORDER — HYDROMORPHONE HCL 2 MG/ML IJ SOLN
2 | INTRAMUSCULAR | Status: AC
Start: 2022-02-01 — End: 2022-02-01

## 2022-02-01 MED ORDER — DEXTROSE 10 % IV SOLN
10 % | INTRAVENOUS | Status: DC | PRN
Start: 2022-02-01 — End: 2022-02-02

## 2022-02-01 MED ORDER — VASOPRESSIN 20 UNIT/ML SOLN (MIXTURES ONLY)
20 UNIT/ML | Freq: Once | Status: AC | PRN
Start: 2022-02-01 — End: 2022-02-01
  Administered 2022-02-01: 14:00:00 7 via INTRAMUSCULAR

## 2022-02-01 MED ORDER — HYDROMORPHONE HCL PF 1 MG/ML IJ SOLN
1 | INTRAMUSCULAR | Status: DC | PRN
Start: 2022-02-01 — End: 2022-02-02

## 2022-02-01 MED ORDER — LACTATED RINGERS IV SOLN
INTRAVENOUS | Status: AC | PRN
Start: 2022-02-01 — End: 2022-02-01
  Administered 2022-02-01: 13:00:00 1000

## 2022-02-01 MED ORDER — POLYETHYLENE GLYCOL 3350 17 G PO PACK
17 | Freq: Every day | ORAL | Status: DC
Start: 2022-02-01 — End: 2022-02-02
  Administered 2022-02-01 – 2022-02-02 (×2): 17 g via ORAL

## 2022-02-01 MED ORDER — VASOPRESSIN 20 UNIT/ML IV SOLN
20 | INTRAVENOUS | Status: AC
Start: 2022-02-01 — End: 2022-02-01

## 2022-02-01 MED ORDER — MIDAZOLAM HCL 2 MG/2ML IJ SOLN
2 MG/ML | INTRAMUSCULAR | Status: DC | PRN
Start: 2022-02-01 — End: 2022-02-01
  Administered 2022-02-01: 12:00:00 2 via INTRAVENOUS

## 2022-02-01 MED ORDER — DAPAGLIFLOZIN PROPANEDIOL 10 MG PO TABS
10 MG | Freq: Every morning | ORAL | Status: DC
Start: 2022-02-01 — End: 2022-02-01

## 2022-02-01 MED ORDER — SODIUM CHLORIDE 0.9 % IV SOLN
0.9 % | INTRAVENOUS | Status: DC | PRN
Start: 2022-02-01 — End: 2022-02-01

## 2022-02-01 MED ORDER — INTERCEED BARRIER 3" X 4"
Freq: Once | CUTANEOUS | Status: AC | PRN
Start: 2022-02-01 — End: 2022-02-01
  Administered 2022-02-01: 14:00:00 1 via TOPICAL

## 2022-02-01 MED ORDER — DEXAMETHASONE SODIUM PHOSPHATE 20 MG/5ML IJ SOLN
20 | INTRAMUSCULAR | Status: AC
Start: 2022-02-01 — End: 2022-02-01

## 2022-02-01 MED ORDER — IBUPROFEN 600 MG PO TABS
600 | Freq: Three times a day (TID) | ORAL | Status: DC
Start: 2022-02-01 — End: 2022-02-02
  Administered 2022-02-01 – 2022-02-02 (×2): 600 mg via ORAL

## 2022-02-01 MED ORDER — INSULIN REGULAR HUMAN 100 UNIT/ML IJ SOLN
100 UNIT/ML | Freq: Once | INTRAMUSCULAR | Status: AC
Start: 2022-02-01 — End: 2022-02-01

## 2022-02-01 MED ORDER — ONDANSETRON HCL 4 MG/2ML IJ SOLN
4 | INTRAMUSCULAR | Status: AC
Start: 2022-02-01 — End: 2022-02-01

## 2022-02-01 MED ORDER — NORMAL SALINE FLUSH 0.9 % IV SOLN
0.9 % | INTRAVENOUS | Status: DC | PRN
Start: 2022-02-01 — End: 2022-02-01

## 2022-02-01 MED ORDER — POLYETHYLENE GLYCOL 3350 17 GM/SCOOP PO POWD
17 | Freq: Every day | ORAL | 1 refills | Status: AC
Start: 2022-02-01 — End: 2022-03-03

## 2022-02-01 MED ORDER — MAGNESIUM SULFATE 50 % IJ SOLN
50 | INTRAMUSCULAR | Status: AC
Start: 2022-02-01 — End: 2022-02-01

## 2022-02-01 MED ORDER — ACETAMINOPHEN 500 MG PO TABS
500 | Freq: Three times a day (TID) | ORAL | Status: DC
Start: 2022-02-01 — End: 2022-02-02
  Administered 2022-02-01 – 2022-02-02 (×3): 1000 mg via ORAL

## 2022-02-01 MED ORDER — PROPOFOL 200 MG/20ML IV EMUL
200 MG/20ML | INTRAVENOUS | Status: DC | PRN
Start: 2022-02-01 — End: 2022-02-01
  Administered 2022-02-01: 12:00:00 200 via INTRAVENOUS

## 2022-02-01 MED ORDER — MIDAZOLAM HCL 2 MG/2ML IJ SOLN
2 | INTRAMUSCULAR | Status: AC
Start: 2022-02-01 — End: 2022-02-01

## 2022-02-01 MED ORDER — DEXTROSE 10 % IV BOLUS
INTRAVENOUS | Status: DC | PRN
Start: 2022-02-01 — End: 2022-02-02

## 2022-02-01 MED ORDER — KETAMINE HCL 50 MG/5ML IJ SOSY
50 | INTRAMUSCULAR | Status: AC
Start: 2022-02-01 — End: 2022-02-01

## 2022-02-01 MED ORDER — INSULIN DETEMIR 100 UNIT/ML SC SOPN
100 UNIT/ML | Freq: Every day | SUBCUTANEOUS | Status: DC
Start: 2022-02-01 — End: 2022-02-01

## 2022-02-01 MED ORDER — ALBUTEROL SULFATE HFA 108 (90 BASE) MCG/ACT IN AERS
108 (90 Base) MCG/ACT | Freq: Four times a day (QID) | RESPIRATORY_TRACT | Status: DC | PRN
Start: 2022-02-01 — End: 2022-02-02

## 2022-02-01 MED ORDER — SODIUM CHLORIDE 0.9 % IV SOLN
0.9 | INTRAVENOUS | Status: AC
Start: 2022-02-01 — End: 2022-02-01

## 2022-02-01 MED ORDER — KETOROLAC TROMETHAMINE 60 MG/2ML IM SOLN
60 MG/2ML | INTRAMUSCULAR | Status: AC
Start: 2022-02-01 — End: 2022-02-01
  Administered 2022-02-01: 15:00:00 30 via INTRAMUSCULAR

## 2022-02-01 MED ORDER — ACETAMINOPHEN 500 MG PO TABS
500 | ORAL_TABLET | Freq: Four times a day (QID) | ORAL | 0 refills | 10.00000 days | Status: AC | PRN
Start: 2022-02-01 — End: 2024-05-20

## 2022-02-01 MED ORDER — NORMAL SALINE FLUSH 0.9 % IV SOLN
0.9 % | Freq: Two times a day (BID) | INTRAVENOUS | Status: DC
Start: 2022-02-01 — End: 2022-02-01

## 2022-02-01 MED ORDER — LEVOTHYROXINE SODIUM 75 MCG PO TABS
75 MCG | Freq: Every day | ORAL | Status: DC
Start: 2022-02-01 — End: 2022-02-02
  Administered 2022-02-02: 09:00:00 75 ug via ORAL

## 2022-02-01 MED ORDER — GLYCOPYRROLATE 1 MG/5ML IJ SOLN
1 MG/5ML | INTRAMUSCULAR | Status: DC | PRN
Start: 2022-02-01 — End: 2022-02-01
  Administered 2022-02-01: 12:00:00 .2 via INTRAVENOUS

## 2022-02-01 MED ORDER — INSULIN LISPRO 100 UNIT/ML IJ SOLN
100 | Freq: Every evening | INTRAMUSCULAR | Status: DC
Start: 2022-02-01 — End: 2022-02-02

## 2022-02-01 MED ORDER — SODIUM CHLORIDE 0.9 % IR SOLN
0.9 % | Status: AC | PRN
Start: 2022-02-01 — End: 2022-02-01
  Administered 2022-02-01: 13:00:00 1000

## 2022-02-01 MED ORDER — ACETAMINOPHEN 500 MG PO TABS
500 MG | Freq: Once | ORAL | Status: AC
Start: 2022-02-01 — End: 2022-02-01
  Administered 2022-02-01: 11:00:00 1000 mg via ORAL

## 2022-02-01 MED ORDER — SUGAMMADEX SODIUM 200 MG/2ML IV SOLN
200 MG/2ML | INTRAVENOUS | Status: DC | PRN
Start: 2022-02-01 — End: 2022-02-01
  Administered 2022-02-01: 14:00:00 200 via INTRAVENOUS

## 2022-02-01 MED ORDER — SODIUM CHLORIDE 0.9 % IV SOLN
0.9 % | INTRAVENOUS | Status: DC
Start: 2022-02-01 — End: 2022-02-01
  Administered 2022-02-01: 11:00:00 via INTRAVENOUS

## 2022-02-01 MED ORDER — CETIRIZINE HCL 10 MG PO TABS
10 | Freq: Every day | ORAL | Status: DC
Start: 2022-02-01 — End: 2022-02-02
  Administered 2022-02-01 – 2022-02-02 (×2): 10 mg via ORAL

## 2022-02-01 MED ORDER — INSULIN GLARGINE 100 UNIT/ML SC SOLN
100 | Freq: Every day | SUBCUTANEOUS | Status: DC
Start: 2022-02-01 — End: 2022-02-02
  Administered 2022-02-02: 12:00:00 30 [IU] via SUBCUTANEOUS

## 2022-02-01 MED ORDER — PHENYLEPHRINE HCL 1 MG/10ML IV SOSY
1 | INTRAVENOUS | Status: AC
Start: 2022-02-01 — End: 2022-02-01

## 2022-02-01 MED ORDER — FENTANYL CITRATE (PF) 100 MCG/2ML IJ SOLN
100 MCG/2ML | INTRAMUSCULAR | Status: DC | PRN
Start: 2022-02-01 — End: 2022-02-01
  Administered 2022-02-01 (×2): 50 via INTRAVENOUS

## 2022-02-01 MED ORDER — LIDOCAINE HCL (PF) 2 % IJ SOLN
2 % | INTRAMUSCULAR | Status: DC | PRN
Start: 2022-02-01 — End: 2022-02-01
  Administered 2022-02-01: 12:00:00 100 via INTRAVENOUS

## 2022-02-01 MED ORDER — ATORVASTATIN CALCIUM 40 MG PO TABS
40 MG | Freq: Every day | ORAL | Status: DC
Start: 2022-02-01 — End: 2022-02-01

## 2022-02-01 MED ORDER — METFORMIN HCL 500 MG PO TABS
500 | Freq: Two times a day (BID) | ORAL | Status: DC
Start: 2022-02-01 — End: 2022-02-02
  Administered 2022-02-01 – 2022-02-02 (×2): 1000 mg via ORAL

## 2022-02-01 MED ORDER — GLUCOSE-VITAMIN C 4-6 GM-MG PO CHEW
4-6 GM-MG | ORAL | Status: DC | PRN
Start: 2022-02-01 — End: 2022-02-01

## 2022-02-01 MED ORDER — OXYCODONE HCL 5 MG PO TABS
5 MG | ORAL | Status: DC | PRN
Start: 2022-02-01 — End: 2022-02-02

## 2022-02-01 MED ORDER — HYDROMORPHONE HCL 2 MG/ML IJ SOLN
2 MG/ML | INTRAMUSCULAR | Status: DC | PRN
Start: 2022-02-01 — End: 2022-02-01
  Administered 2022-02-01 (×2): .5 via INTRAVENOUS

## 2022-02-01 MED ORDER — CEFAZOLIN SODIUM 2 G SOLR (MIXTURES ONLY)
2 | INTRAVENOUS | Status: AC
Start: 2022-02-01 — End: 2022-02-01
  Administered 2022-02-01: 12:00:00 2000 mg via INTRAVENOUS

## 2022-02-01 MED ORDER — SUCCINYLCHOLINE CHLORIDE 200 MG/10ML IV SOSY
200 | INTRAVENOUS | Status: AC
Start: 2022-02-01 — End: 2022-02-01

## 2022-02-01 MED ORDER — MEPERIDINE HCL 25 MG/ML IJ SOLN
25 MG/ML | INTRAMUSCULAR | Status: DC | PRN
Start: 2022-02-01 — End: 2022-02-01

## 2022-02-01 MED ORDER — ONDANSETRON HCL 4 MG/2ML IJ SOLN
4 | Freq: Four times a day (QID) | INTRAMUSCULAR | Status: DC | PRN
Start: 2022-02-01 — End: 2022-02-02

## 2022-02-01 MED ORDER — VECURONIUM BROMIDE 10 MG IV SOLR
10 MG | INTRAVENOUS | Status: DC | PRN
Start: 2022-02-01 — End: 2022-02-01
  Administered 2022-02-01: 12:00:00 10 via INTRAVENOUS

## 2022-02-01 MED ORDER — OXYCODONE HCL 5 MG PO TABS
5 MG | ORAL | Status: DC | PRN
Start: 2022-02-01 — End: 2022-02-02
  Administered 2022-02-02 (×3): 5 mg via ORAL

## 2022-02-01 MED ORDER — ENOXAPARIN SODIUM 40 MG/0.4ML IJ SOSY
40 | Freq: Every day | INTRAMUSCULAR | Status: DC
Start: 2022-02-01 — End: 2022-02-02
  Administered 2022-02-01 – 2022-02-02 (×2): 40 mg via SUBCUTANEOUS

## 2022-02-01 MED ORDER — KETAMINE HCL 50 MG/5ML IJ SOSY
50 MG/5ML | INTRAMUSCULAR | Status: DC | PRN
Start: 2022-02-01 — End: 2022-02-01
  Administered 2022-02-01: 13:00:00 20 via INTRAVENOUS
  Administered 2022-02-01: 13:00:00 10 via INTRAVENOUS
  Administered 2022-02-01: 13:00:00 20 via INTRAVENOUS

## 2022-02-01 MED ORDER — ATORVASTATIN CALCIUM 20 MG PO TABS
20 | Freq: Every day | ORAL | Status: DC
Start: 2022-02-01 — End: 2022-02-02
  Administered 2022-02-01 – 2022-02-02 (×2): 20 mg via ORAL

## 2022-02-01 MED ORDER — ONDANSETRON HCL 4 MG/2ML IJ SOLN
4 MG/2ML | Freq: Once | INTRAMUSCULAR | Status: DC | PRN
Start: 2022-02-01 — End: 2022-02-01

## 2022-02-01 MED ORDER — MONTELUKAST SODIUM 10 MG PO TABS
10 MG | Freq: Every day | ORAL | Status: DC
Start: 2022-02-01 — End: 2022-02-02
  Administered 2022-02-01 – 2022-02-02 (×2): 10 mg via ORAL

## 2022-02-01 MED ORDER — ACYCLOVIR 200 MG PO CAPS
200 MG | Freq: Three times a day (TID) | ORAL | Status: DC
Start: 2022-02-01 — End: 2022-02-01

## 2022-02-01 MED ORDER — LIDOCAINE HCL (PF) 2 % IJ SOLN
2 | INTRAMUSCULAR | Status: AC
Start: 2022-02-01 — End: 2022-02-01

## 2022-02-01 MED ORDER — INDOCYANINE GREEN 25 MG IV SOLR
25 | INTRAVENOUS | Status: AC
Start: 2022-02-01 — End: 2022-02-01

## 2022-02-01 MED ORDER — PROPOFOL 200 MG/20ML IV EMUL
200 | INTRAVENOUS | Status: AC
Start: 2022-02-01 — End: 2022-02-01

## 2022-02-01 MED ORDER — NORMAL SALINE FLUSH 0.9 % IV SOLN
0.9 | INTRAVENOUS | Status: DC | PRN
Start: 2022-02-01 — End: 2022-02-02

## 2022-02-01 MED ORDER — SUGAMMADEX SODIUM 200 MG/2ML IV SOLN
200 | INTRAVENOUS | Status: AC
Start: 2022-02-01 — End: 2022-02-01

## 2022-02-01 MED ORDER — OXYCODONE HCL 5 MG PO TABS
5 | ORAL_TABLET | Freq: Four times a day (QID) | ORAL | 0 refills | 7.00000 days | Status: AC | PRN
Start: 2022-02-01 — End: 2022-02-08

## 2022-02-01 MED ORDER — SODIUM CHLORIDE 0.9 % IV SOLN
0.9 | INTRAVENOUS | Status: DC
Start: 2022-02-01 — End: 2022-02-02
  Administered 2022-02-01 – 2022-02-02 (×2): via INTRAVENOUS

## 2022-02-01 MED ORDER — VECURONIUM BROMIDE 10 MG IV SOLR
10 | INTRAVENOUS | Status: AC
Start: 2022-02-01 — End: 2022-02-01

## 2022-02-01 MED ORDER — MAGNESIUM SULFATE 50 % IJ SOLN
50 % | INTRAMUSCULAR | Status: DC | PRN
Start: 2022-02-01 — End: 2022-02-01
  Administered 2022-02-01: 12:00:00 1 via INTRAVENOUS

## 2022-02-01 MED ORDER — LEVOTHYROXINE SODIUM 100 MCG PO TABS
100 MCG | Freq: Every day | ORAL | Status: DC
Start: 2022-02-01 — End: 2022-02-01

## 2022-02-01 MED ORDER — ACYCLOVIR 200 MG PO CAPS
200 MG | Freq: Two times a day (BID) | ORAL | Status: DC
Start: 2022-02-01 — End: 2022-02-02
  Administered 2022-02-01 – 2022-02-02 (×3): 400 mg via ORAL

## 2022-02-01 MED ORDER — BUPIVACAINE HCL (PF) 0.5 % IJ SOLN
0.5 | INTRAMUSCULAR | Status: AC
Start: 2022-02-01 — End: 2022-02-01

## 2022-02-01 MED ORDER — LABETALOL HCL 5 MG/ML IV SOLN
5 | INTRAVENOUS | Status: AC
Start: 2022-02-01 — End: 2022-02-01

## 2022-02-01 MED ORDER — SODIUM CHLORIDE 0.9 % IV SOLN
0.9 % | INTRAVENOUS | Status: DC | PRN
Start: 2022-02-01 — End: 2022-02-01
  Administered 2022-02-01 (×2): via INTRAVENOUS

## 2022-02-01 MED ORDER — ONDANSETRON 4 MG PO TBDP
4 | Freq: Three times a day (TID) | ORAL | Status: DC | PRN
Start: 2022-02-01 — End: 2022-02-02

## 2022-02-01 MED ORDER — LABETALOL HCL 5 MG/ML IV SOLN
5 MG/ML | INTRAVENOUS | Status: DC | PRN
Start: 2022-02-01 — End: 2022-02-01
  Administered 2022-02-01: 12:00:00 10 via INTRAVENOUS

## 2022-02-01 MED ORDER — NORMAL SALINE FLUSH 0.9 % IV SOLN
0.9 | Freq: Two times a day (BID) | INTRAVENOUS | Status: DC
Start: 2022-02-01 — End: 2022-02-02
  Administered 2022-02-02 (×2): 10 mL via INTRAVENOUS

## 2022-02-01 MED ORDER — FENTANYL CITRATE (PF) 100 MCG/2ML IJ SOLN
100 | INTRAMUSCULAR | Status: AC
Start: 2022-02-01 — End: 2022-02-01

## 2022-02-01 MED ORDER — LOSARTAN POTASSIUM 25 MG PO TABS
25 MG | Freq: Every day | ORAL | Status: DC
Start: 2022-02-01 — End: 2022-02-01

## 2022-02-01 MED ORDER — INSULIN REGULAR HUMAN 100 UNIT/ML IJ SOLN
100 UNIT/ML | INTRAMUSCULAR | Status: AC
Start: 2022-02-01 — End: 2022-02-01
  Administered 2022-02-01: 15:00:00 5 via INTRAVENOUS

## 2022-02-01 MED ORDER — DEXMEDETOMIDINE HCL 200 MCG/2ML IV SOLN
200 MCG/2ML | INTRAVENOUS | Status: DC | PRN
Start: 2022-02-01 — End: 2022-02-01
  Administered 2022-02-01 (×2): 10 via INTRAVENOUS

## 2022-02-01 MED ORDER — IBUPROFEN 600 MG PO TABS
600 | ORAL_TABLET | Freq: Four times a day (QID) | ORAL | 0 refills | 12.00000 days | Status: AC | PRN
Start: 2022-02-01 — End: 2024-05-20

## 2022-02-01 MED ORDER — LOSARTAN POTASSIUM 25 MG PO TABS
25 MG | Freq: Every day | ORAL | Status: DC
Start: 2022-02-01 — End: 2022-02-02
  Administered 2022-02-01: 19:00:00 25 mg via ORAL

## 2022-02-01 MED ORDER — ONDANSETRON HCL 4 MG/2ML IJ SOLN
4 MG/2ML | INTRAMUSCULAR | Status: DC | PRN
Start: 2022-02-01 — End: 2022-02-01
  Administered 2022-02-01: 12:00:00 4 via INTRAVENOUS

## 2022-02-01 MED ORDER — MELATONIN 3 MG PO TABS
3 | Freq: Every evening | ORAL | Status: DC | PRN
Start: 2022-02-01 — End: 2022-02-02
  Administered 2022-02-02: 02:00:00 3 mg via ORAL

## 2022-02-01 MED FILL — KETOROLAC TROMETHAMINE 60 MG/2ML IM SOLN: 60 MG/2ML | INTRAMUSCULAR | Qty: 2

## 2022-02-01 MED FILL — PROAIR HFA 108 (90 BASE) MCG/ACT IN AERS: 108 (90 Base) MCG/ACT | RESPIRATORY_TRACT | Qty: 8.5

## 2022-02-01 MED FILL — HYDROMORPHONE HCL 2 MG/ML IJ SOLN: 2 mg/mL | INTRAMUSCULAR | Qty: 1

## 2022-02-01 MED FILL — MAGNESIUM SULFATE 50 % IJ SOLN: 50 % | INTRAMUSCULAR | Qty: 2

## 2022-02-01 MED FILL — BRIDION 200 MG/2ML IV SOLN: 200 MG/2ML | INTRAVENOUS | Qty: 2

## 2022-02-01 MED FILL — INSULIN LISPRO 100 UNIT/ML IJ SOLN: 100 [IU]/mL | INTRAMUSCULAR | Qty: 4

## 2022-02-01 MED FILL — LABETALOL HCL 5 MG/ML IV SOLN: 5 mg/mL | INTRAVENOUS | Qty: 20

## 2022-02-01 MED FILL — HYDROMORPHONE HCL 1 MG/ML IJ SOLN: 1 mg/mL | INTRAMUSCULAR | Qty: 1

## 2022-02-01 MED FILL — SUCCINYLCHOLINE CHLORIDE 200 MG/10ML IV SOSY: 200 MG/10ML | INTRAVENOUS | Qty: 10

## 2022-02-01 MED FILL — METFORMIN HCL 500 MG PO TABS: 500 mg | ORAL | Qty: 2

## 2022-02-01 MED FILL — BUPIVACAINE HCL (PF) 0.5 % IJ SOLN: 0.5 % | INTRAMUSCULAR | Qty: 30

## 2022-02-01 MED FILL — VASOSTRICT 20 UNIT/ML IV SOLN: 20 [IU]/mL | INTRAVENOUS | Qty: 1

## 2022-02-01 MED FILL — ONDANSETRON HCL 4 MG/2ML IJ SOLN: 4 MG/2ML | INTRAMUSCULAR | Qty: 2

## 2022-02-01 MED FILL — ACETAMINOPHEN EXTRA STRENGTH 500 MG PO TABS: 500 mg | ORAL | Qty: 2

## 2022-02-01 MED FILL — HEALTHYLAX 17 G PO PACK: 17 g | ORAL | Qty: 1

## 2022-02-01 MED FILL — LOSARTAN POTASSIUM 25 MG PO TABS: 25 mg | ORAL | Qty: 1

## 2022-02-01 MED FILL — VECURONIUM BROMIDE 10 MG IV SOLR: 10 mg | INTRAVENOUS | Qty: 10

## 2022-02-01 MED FILL — MONTELUKAST SODIUM 10 MG PO TABS: 10 mg | ORAL | Qty: 1

## 2022-02-01 MED FILL — KETAMINE HCL 50 MG/5ML IJ SOSY: 50 MG/5ML | INTRAMUSCULAR | Qty: 5

## 2022-02-01 MED FILL — MIDAZOLAM HCL 2 MG/2ML IJ SOLN: 2 mg/mL | INTRAMUSCULAR | Qty: 2

## 2022-02-01 MED FILL — DEXAMETHASONE SODIUM PHOSPHATE 20 MG/5ML IJ SOLN: 20 MG/5ML | INTRAMUSCULAR | Qty: 5

## 2022-02-01 MED FILL — GLYCOPYRROLATE 1 MG/5ML IJ SOLN: 1 MG/5ML | INTRAMUSCULAR | Qty: 5

## 2022-02-01 MED FILL — ACYCLOVIR 200 MG PO CAPS: 200 mg | ORAL | Qty: 2

## 2022-02-01 MED FILL — CEFAZOLIN SODIUM 2 G IJ SOLR: 2 g | INTRAMUSCULAR | Qty: 2000

## 2022-02-01 MED FILL — LOVENOX 40 MG/0.4ML IJ SOSY: 40 MG/0.4ML | INTRAMUSCULAR | Qty: 0.4

## 2022-02-01 MED FILL — FENTANYL CITRATE (PF) 100 MCG/2ML IJ SOLN: 100 MCG/2ML | INTRAMUSCULAR | Qty: 2

## 2022-02-01 MED FILL — PHENYLEPHRINE HCL (PRESSORS) 1 MG/10ML IV SOSY: 1 MG/0ML | INTRAVENOUS | Qty: 10

## 2022-02-01 MED FILL — XYLOCAINE-MPF 2 % IJ SOLN: 2 % | INTRAMUSCULAR | Qty: 5

## 2022-02-01 MED FILL — INDOCYANINE GREEN 25 MG IV SOLR: 25 mg | INTRAVENOUS | Qty: 10

## 2022-02-01 MED FILL — HUMULIN R 100 UNIT/ML IJ SOLN: 100 [IU]/mL | INTRAMUSCULAR | Qty: 1

## 2022-02-01 MED FILL — SODIUM CHLORIDE 0.9 % IV SOLN: 0.9 % | INTRAVENOUS | Qty: 100

## 2022-02-01 MED FILL — CETIRIZINE HCL 10 MG PO TABS: 10 mg | ORAL | Qty: 1

## 2022-02-01 MED FILL — DIPRIVAN 200 MG/20ML IV EMUL: 200 MG/20ML | INTRAVENOUS | Qty: 40

## 2022-02-01 MED FILL — IBUPROFEN 600 MG PO TABS: 600 mg | ORAL | Qty: 1

## 2022-02-01 MED FILL — ATORVASTATIN CALCIUM 20 MG PO TABS: 20 mg | ORAL | Qty: 1

## 2022-02-01 NOTE — Progress Notes (Signed)
Patient following commands, oral airway removed. O2 sat 100%

## 2022-02-01 NOTE — Progress Notes (Signed)
Per [employer] approved formulary policy.     SGLT2's are only formulary with the indication of CKD or CHF therefore:    Please note that the  Dapagliflozin Wilder Glade) is non-formulary with indications of type 2 diabetes and has been discontinued while inpatient. If you feel the patient needs to continue their home therapy during the inpatient stay, the patient may bring their medication bottle for verification and administration pursuant to our home medication use policy.      Please contact the pharmacy with any questions or concerns.  Thank you,  Kathrine Cords, PharmD  02/01/2022 11:44 AM

## 2022-02-01 NOTE — Progress Notes (Signed)
Patient starting to respond to stimuli. Vitals stable. BS - 305 - order for insulin given per anesthesia.

## 2022-02-01 NOTE — Anesthesia Pre-Procedure Evaluation (Signed)
Department of Anesthesiology  Preprocedure Note       Name:  Shelly Richard   Age:  36 y.o.  DOB:  1986-03-10                                          MRN:  [id number]         Date:  02/01/2022      Surgeon: Juliann Mule):  Foley, Audrie Gallus, MD    Procedure: Procedure(s):  ROBOTIC MYOMECTOMY    Medications prior to admission:   Prior to Admission medications    Medication Sig Start Date End Date Taking? Authorizing Provider   MYFEMBREE 40-1-0.5 MG TABS Take by mouth daily 01/18/22   [provider]   Semaglutide, 1 MG/DOSE, (OZEMPIC, 1 MG/DOSE,) 4 MG/3ML SOPN Inject 1 mg into the skin once a week 01/22/22 02/21/22  Herbert Deaner, [name]   Continuous Blood Gluc Sensor (FREESTYLE LIBRE 2 SENSOR) MISC USE TO TEST BLOOD GLUCOSE AS DIRECTED 12/25/21   Herbert Deaner, [name]   simvastatin (ZOCOR) 40 MG tablet TAKE ONE TABLET BY MOUTH DAILY 12/25/21   Herbert Deaner, [name]   dapagliflozin (FARXIGA) 10 MG tablet Take 1 tablet by mouth every morning 12/25/21   Herbert Deaner, [name]   insulin lispro, 1 Unit Dial, (HUMALOG KWIKPEN) 100 UNIT/ML SOPN Inject 10 units with meals plus Inject 2 units for BS 150-200, 4 units for BS 201-250, 6 units for BS 251-300, 8 units for BS 301-350, 10 units for BS greater than 350 with meals 12/25/21   Herbert Deaner, [name]   Continuous Blood Gluc Sensor (FREESTYLE LIBRE 3 SENSOR) MISC Check glucose per diagnosis E11.9  Patient not taking: Reported on 01/22/2022 12/25/21   Herbert Deaner, [name]   Ostomy Supplies (SKIN PREP WIPES) MISC 50 each by Does not apply route every 14 days 12/20/21 01/19/22  Herbert Deaner, [name]   insulin detemir (LEVEMIR FLEXTOUCH) 100 UNIT/ML injection pen INJECT 30 UNITS UNDER THE SKIN NIGHTLY  Patient taking differently: every morning (before breakfast) INJECT 30 UNITS UNDER THE SKIN NIGHTLY 12/20/21   Herbert Deaner, [name]   cetirizine (ZYRTEC) 10 MG tablet Take 1 tablet by mouth daily 11/30/21   Herbert Deaner, APRN -  CNP   FEROSUL 325 (65 Fe) MG tablet TAKE ONE TABLET BY MOUTH DAILY WITH BREAKFAST 07/24/21   Herbert Deaner, [name]   letrozole [facility]) 2.5 MG tablet  06/06/21   [provider]   norethindrone (AYGESTIN) 5 MG tablet  06/24/21   [provider]   losartan (COZAAR) 25 MG tablet TAKE ONE TABLET BY MOUTH DAILY 07/05/21   Herbert Deaner, [name]   metFORMIN (GLUCOPHAGE) 500 MG tablet TAKE TWO TABLETS BY MOUTH TWICE A DAY WITH MEALS 06/26/21   Herbert Deaner, [name]   Cholecalciferol (D3) 50 MCG (2000 UT) TABS TAKE ONE TABLET BY MOUTH DAILY 06/26/21   Herbert Deaner, [name]   Insulin Pen Needle 32G X 6 MM MISC Use four times daily and as needed 06/12/21   Herbert Deaner, [name]   Insulin Pen Needle 32G X 6 MM MISC Once daily 06/11/21   Gettelfinger, Carola Frost, [name]   levothyroxine (SYNTHROID)  75 MCG tablet TAKE ONE TABLET BY MOUTH DAILY 06/07/21   Herbert Deaner, [name]   Continuous Blood Gluc Sensor (FREESTYLE LIBRE 3 SENSOR) MISC Check glucose per diagnosis E11.9  Patient not taking: Reported on 01/22/2022 05/03/21   Herbert Deaner, [name]   EPINEPHrine [facility]) 0.3 MG/0.3ML SOAJ injection  04/05/21   [provider]   blood glucose monitor strips Tes2t qam  times a day & as needed for symptoms of irregular blood glucose. Please provide test strips approved by insurance. 12/29/19   Herbert Deaner, [name]   montelukast (SINGULAIR) 10 MG tablet TAKE ONE TABLET BY MOUTH DAILY 12/21/19   Herbert Deaner, [name]   albuterol sulfate HFA 108 (90 Base) MCG/ACT inhaler Inhale 2 puffs into the lungs every 6 hours as needed for Wheezing or Shortness of Breath 04/26/19   Herbert Deaner, [name]   Insulin Pen Needle ([employer] PEN NEEDLES 31G) 31G X 8 MM MISC 1 each by Does not apply route daily 02/10/19   Herbert Deaner, [name]   Insulin Pen Needle ([employer] PEN NEEDLES 29G) 29G X 12MM MISC 1 each by Does not apply route daily 11/03/18   Herbert Deaner, [name]    valACYclovir (VALTREX) 500 MG tablet TAKE 1 TABLET BY MOUTH DAILY 03/03/18   Lavona Mound, MD   Lancets MISC 1 each by Does not apply route daily Please provide lancets approved by insurance. 02/16/18   Lise Auer, MD   Blood Glucose Monitoring Suppl [facility] BLOOD GLUCOSE METER) w/Device KIT Please provide meter approved by insurance.  Check bs qam 02/16/18   Lise Auer, MD   Lactobacillus (ACIDOPHILUS) TABS TAKE 1 TABLET BY MOUTH EVERY DAY  Patient not taking: Reported on 01/22/2022 12/23/17   Lavona Mound, MD   ibuprofen (ADVIL;MOTRIN) 600 MG tablet Take 1 tablet by mouth every 6 hours as needed for Pain 12/14/15   Lavona Mound, MD       Current medications:    Current Facility-Administered Medications   Medication Dose Route Frequency Provider Last Rate Last Admin   . 0.9 % sodium chloride infusion   IntraVENous Continuous Foley, Audrie Gallus, MD 50 mL/hr at 02/01/22 0700 New Bag at 02/01/22 0700   . lidocaine PF 1 % injection 0.5 mL  0.5 mL IntraDERmal Once Foley, Audrie Gallus, MD       . ceFAZolin (ANCEF) 2,000 mg in sodium chloride 0.9 % 50 mL IVPB (mini-bag)  2,000 mg IntraVENous On Call to OR Foley, Audrie Gallus, MD           Allergies:    Allergies   Allergen Reactions   . Ace Inhibitors Swelling   . Lisinopril Other (See Comments)     Swollen lips   . Peanuts [Peanut Oil]      THROAT ITCHES  Walnuts       Problem List:    Patient Active Problem List   Diagnosis Code   . Mixed hyperlipidemia E78.2   . Iron deficiency anemia D50.9   . Type 2 diabetes mellitus without complication, with long-term current use of insulin (HCC) E11.9, Z79.4   . Essential hypertension I10   . Acquired hypothyroidism E03.9   . Mild intermittent asthma without complication U72.53   . Allergic rhinitis J30.9   . Heart murmur R01.1   . Uterine fibroid D25.9   . Preop examination Z01.818   .  Class 1 obesity due to excess calories with serious comorbidity and body mass index (BMI) of 33.0 to 33.9 in adult E66.09, Z68.33    . Abnormal uterine bleeding N93.9   . Eczema L30.9   . Glaucoma suspect, bilateral H40.003   . Hidradenitis axillaris L73.2   . HSV-2 infection B00.9   . Myopic astigmatism, bilateral H52.203, H52.13   . Strabismus H50.9       Past Medical History:        Diagnosis Date   . Allergies    . Asthma    . Diabetes mellitus ([place])    . Genital herpes    . HTN (hypertension)    . Hyperlipidemia    . Hypothyroid    . Urticaria        Past Surgical History:        Procedure Laterality Date   . CYST REMOVAL     . HYSTEROSCOPY      x2       Social History:    Social History     Tobacco Use   . Smoking status: Never   . Smokeless tobacco: Never   Substance Use Topics   . Alcohol use: No                                Counseling given: Not Answered      Vital Signs (Current):   Vitals:    01/24/22 1552 02/01/22 0656   BP:  137/77   Pulse:  94   Resp:  15   Temp:  97.4 F (36.3 C)   TempSrc:  Temporal   SpO2:  97%   Weight: 93.4 kg (206 lb) 92.7 kg (204 lb 5.9 oz)   Height: 1.626 m ('5\' 4"' )                                               BP Readings from Last 3 Encounters:   02/01/22 137/77   01/22/22 110/68   12/20/21 110/80       NPO Status: Time of last liquid consumption: 2330                        Time of last solid consumption: 0000                        Date of last liquid consumption: 01/31/22                        Date of last solid food consumption: 01/31/22    BMI:   Wt Readings from Last 3 Encounters:   02/01/22 92.7 kg (204 lb 5.9 oz)   01/22/22 93.4 kg (206 lb)   12/20/21 91.2 kg (201 lb)     Body mass index is 35.08 kg/m.    CBC:   Lab Results   Component Value Date/Time    WBC 11.5 01/25/2022 02:34 PM    RBC 4.74 01/25/2022 02:34 PM    HGB 13.9 01/25/2022 02:34 PM    HCT 41.1 01/25/2022 02:34 PM    MCV 86.7 01/25/2022 02:34 PM    RDW 14.1 01/25/2022 02:34 PM    PLT 278 01/25/2022 02:34 PM  CMP:   Lab Results   Component Value Date/Time    NA 142 01/25/2022 02:34 PM    K 4.0 01/25/2022 02:34 PM    CL 105  01/25/2022 02:34 PM    CO2 28 01/25/2022 02:34 PM    BUN 14 01/25/2022 02:34 PM    CREATININE 0.8 01/25/2022 02:34 PM    GFRAA >60 08/11/2019 12:18 PM    AGRATIO 1.5 08/29/2021 04:09 PM    LABGLOM >60 01/25/2022 02:34 PM    GLUCOSE 127 01/25/2022 02:34 PM    PROT 7.7 08/29/2021 04:09 PM    CALCIUM 9.1 01/25/2022 02:34 PM    BILITOT 0.7 08/29/2021 04:09 PM    ALKPHOS 105 08/29/2021 04:09 PM    AST 9 08/29/2021 04:09 PM    ALT 9 08/29/2021 04:09 PM       POC Tests:   Recent Labs     02/01/22  0654   POCGLU 178*       Coags: No results found for: "PROTIME", "INR", "APTT"    HCG (If Applicable):   Lab Results   Component Value Date    PREGTESTUR Negative 02/01/2022        ABGs: No results found for: "PHART", "PO2ART", "PCO2ART", "HCO3ART", "BEART", "O2SATART"     Type & Screen (If Applicable):  No results found for: "LABABO", "LABRH"    Drug/Infectious Status (If Applicable):  No results found for: "HIV", "HEPCAB"    COVID-19 Screening (If Applicable):   Lab Results   Component Value Date/Time    COVID19 DETECTED 04/22/2019 02:15 PM           Anesthesia Evaluation  Patient summary reviewed and Nursing notes reviewed  Airway: Mallampati: III          Dental:          Pulmonary:   (+) asthma:                            Cardiovascular:  Exercise tolerance: good (>4 METS),   (+) hypertension:,                   Neuro/Psych:               GI/Hepatic/Renal:             Endo/Other:    (+) Diabetes, hypothyroidism::., .                 Abdominal:             Vascular:          Other Findings:           Anesthesia Plan      general     ASA 2                                   Levonne Hubert, MD   02/01/2022

## 2022-02-01 NOTE — Op Note (Signed)
[facility]                     [place] [address]                                OPERATIVE REPORT    PATIENT NAME: Shelly Richard, GRITZ                  DOB:        October 22, 1985  MED REC NO:   [id number]                          ROOM:       4469  ACCOUNT NO:   [id number]                           ADMIT DATE: 02/01/2022  PROVIDER:     Brett Albino, MD    DATE OF PROCEDURE:  02/01/2022    PREOPERATIVE DIAGNOSES:  Enlarged fibroid uterus with abnormal bleeding  and desires future fertility.    POSTOPERATIVE DIAGNOSES:  Enlarged fibroid uterus with abnormal bleeding  and desires future fertility.    OPERATION PERFORMED:  Robotic-assisted myomectomy.    SURGEON:  Brett Albino, MD    ANESTHESIA:  General endotracheal.    ESTIMATED BLOOD LOSS:  Less than 100 mL.    COMPLICATIONS:  None.    SPECIMENS:  Uterine fibroids.    INDICATIONS:  This is a 36 year old female who had a prior attempted  hysteroscopic resection of the fibroid that failed and she presented for  surgical management.  The risks, benefits, and alternatives were  discussed and she desires a robotic myomectomy.    OPERATIVE PROCEDURE:  The patient was brought back to the operating  room.  General anesthesia was found to be adequate.  She was then placed  in the dorsal lithotomy position with both hands tucked.  She was then  prepped and draped in a normal sterile fashion.  Attention was turned to  the pelvis where the cervix was grasped with a single-tooth tenaculum.   The uterus sounded to 13 cm.  Cervical cup was 3 cm.  The uterine  manipulator was then placed without difficulty and the Hydie Langan catheter  was then placed.  Attention was turned to the abdomen where a 3-cm  incision was made superior to the umbilicus.  This was then carried down  to the fascia.  The fascia was then incised and the gel port Alexis  retractor was then placed without difficulty.  The gel port was then  placed and pneumoperitoneum was  then achieved.  Lateral robotic ports  were then placed under direct visualization.  The patient was placed in  Trendelenburg position and the robot was then docked.  Using a spinal  needle, diluted vasopressin was then infiltrated into the uterine  serosa.  A transverse incision was made over the fundus.  The fibroid  capsule was then found and the fibroid was then ultimately bluntly and  sharply dissected.  The cavity was then entered due to the location of  the fibroid.  The 8-cm fibroid was then removed.  This uterine defect  was then closed with three layers of 2-0 barbed sutured.  This operative  site was then hemostatic.  There was another fibroid anteriorly.  This  was pedunculated and removed without difficulty.  This defect was then  closed with a single layer of 2-0 barbed suture.  These sites were then  found to be hemostatic.  The pelvis was irrigated.  The uterus was then  further inspected and no further fibroids were noted.  Once this was  completed, the robotic was undocked and the patient was then out the  Trendelenburg.  The suture that had been placed through the fibroid  specimen was then grasped.  The bag was then placed through the gel  port.  The specimen was then placed in the bag and the bag was then  brought out of the incision and the fibroid was then morcellated out by  hand without difficulty.  The Alexis retractor was then removed and the  fascia was then closed with a running O-PDS and the skin was closed with  4-0 Monocryl and Dermabond.  The patient was taken to PACU in stable  condition.        Brett Albino, MD    D: 02/01/2022 10:56:17       T: 02/01/2022 20:23:04     [id number]  Job#: [id number]     Doc#: [id number]    CC:

## 2022-02-01 NOTE — Progress Notes (Signed)
Teaching / education initiated regarding perioperative experience, expectations, and pain management during stay. Patient verbalized understanding.

## 2022-02-01 NOTE — Progress Notes (Signed)
02/01/22 1757   RT Protocol   History Pulmonary Disease 2   Respiratory pattern 0   Breath sounds 0   Cough 0   Bronchodilator Assessment Score 2

## 2022-02-01 NOTE — H&P (Signed)
Department of Gynecology   Pre-operative History and Physical        DIAGNOSIS:  uterine fibroid    PLANNED PROCEDURE:  robotic myomectomy    Reason for Admission:  surgery    History obtained from patient with large 8cm fibroid impinging cavity.      Past Medical History:    Past Medical History:   Diagnosis Date    Allergies     Asthma     Diabetes mellitus ([place])     Genital herpes     HTN (hypertension)     Hyperlipidemia     Hypothyroid     Urticaria         Past Surgical History:    Past Surgical History:   Procedure Laterality Date    CYST REMOVAL      HYSTEROSCOPY      x2        Medications Prior to Admission:  Medications Prior to Admission: MYFEMBREE 40-1-0.5 MG TABS, Take by mouth daily  Semaglutide, 1 MG/DOSE, (OZEMPIC, 1 MG/DOSE,) 4 MG/3ML SOPN, Inject 1 mg into the skin once a week  Continuous Blood Gluc Sensor (FREESTYLE LIBRE 2 SENSOR) MISC, USE TO TEST BLOOD GLUCOSE AS DIRECTED  simvastatin (ZOCOR) 40 MG tablet, TAKE ONE TABLET BY MOUTH DAILY  dapagliflozin (FARXIGA) 10 MG tablet, Take 1 tablet by mouth every morning  insulin lispro, 1 Unit Dial, (HUMALOG KWIKPEN) 100 UNIT/ML SOPN, Inject 10 units with meals plus Inject 2 units for BS 150-200, 4 units for BS 201-250, 6 units for BS 251-300, 8 units for BS 301-350, 10 units for BS greater than 350 with meals  Continuous Blood Gluc Sensor (FREESTYLE LIBRE 3 SENSOR) MISC, Check glucose per diagnosis E11.9 (Patient not taking: Reported on 01/22/2022)  Ostomy Supplies (SKIN PREP WIPES) MISC, 50 each by Does not apply route every 14 days  insulin detemir (LEVEMIR FLEXTOUCH) 100 UNIT/ML injection pen, INJECT 30 UNITS UNDER THE SKIN NIGHTLY (Patient taking differently: every morning (before breakfast) INJECT 30 UNITS UNDER THE SKIN NIGHTLY)  cetirizine (ZYRTEC) 10 MG tablet, Take 1 tablet by mouth daily  FEROSUL 325 (65 Fe) MG tablet, TAKE ONE TABLET BY MOUTH DAILY WITH BREAKFAST  letrozole (FEMARA) 2.5 MG tablet,   norethindrone (AYGESTIN) 5 MG tablet,    losartan (COZAAR) 25 MG tablet, TAKE ONE TABLET BY MOUTH DAILY  metFORMIN (GLUCOPHAGE) 500 MG tablet, TAKE TWO TABLETS BY MOUTH TWICE A DAY WITH MEALS  Cholecalciferol (D3) 50 MCG (2000 UT) TABS, TAKE ONE TABLET BY MOUTH DAILY  Insulin Pen Needle 32G X 6 MM MISC, Use four times daily and as needed  Insulin Pen Needle 32G X 6 MM MISC, Once daily  levothyroxine (SYNTHROID) 75 MCG tablet, TAKE ONE TABLET BY MOUTH DAILY  Continuous Blood Gluc Sensor (FREESTYLE LIBRE 3 SENSOR) MISC, Check glucose per diagnosis E11.9 (Patient not taking: Reported on 01/22/2022)  EPINEPHrine (EPIPEN) 0.3 MG/0.3ML SOAJ injection,   blood glucose monitor strips, Tes2t qam  times a day & as needed for symptoms of irregular blood glucose. Please provide test strips approved by insurance.  montelukast (SINGULAIR) 10 MG tablet, TAKE ONE TABLET BY MOUTH DAILY  albuterol sulfate HFA 108 (90 Base) MCG/ACT inhaler, Inhale 2 puffs into the lungs every 6 hours as needed for Wheezing or Shortness of Breath  Insulin Pen Needle ([employer] PEN NEEDLES 31G) 31G X 8 MM MISC, 1 each by Does not apply route daily  Insulin Pen Needle ([employer] PEN NEEDLES 29G) 29G X 12MM MISC, 1  each by Does not apply route daily  valACYclovir (VALTREX) 500 MG tablet, TAKE 1 TABLET BY MOUTH DAILY  Lancets MISC, 1 each by Does not apply route daily Please provide lancets approved by insurance.  Blood Glucose Monitoring Suppl (ACURA BLOOD GLUCOSE METER) w/Device KIT, Please provide meter approved by insurance. Check bs qam  Lactobacillus (ACIDOPHILUS) TABS, TAKE 1 TABLET BY MOUTH EVERY DAY (Patient not taking: Reported on 01/22/2022)  ibuprofen (ADVIL;MOTRIN) 600 MG tablet, Take 1 tablet by mouth every 6 hours as needed for Pain     Allergies:    Allergies   Allergen Reactions    Ace Inhibitors Swelling    Lisinopril Other (See Comments)     Swollen lips    Peanuts [Peanut Oil]      THROAT ITCHES  Walnuts        Social History:   reports that she has never smoked. She has never  used smokeless tobacco. She reports that she does not drink alcohol and does not use drugs.     PHYSICAL EXAM:    Patient Vitals for the past 24 hrs:   BP Temp Temp src Pulse Resp SpO2 Weight   02/01/22 0656 137/77 97.4 F (36.3 C) Temporal 94 15 97 % 92.7 kg (204 lb 5.9 oz)      General appearance - alert, well appearing, and in no distress  Chest - clear to auscultation, no wheezes, rales or rhonchi, symmetric air entry  Heart - normal rate, regular rhythm, normal S1, S2, no murmurs, rubs, clicks or gallops     ASSESSMENT AND PLAN:      1.  Robotic myomectomy      2.  Procedure options, risks and benefits reviewed with patient.  Patient expresses understanding.

## 2022-02-01 NOTE — Care Coordination-Inpatient (Signed)
Discharge Planning Note:    Chart reviewed and it appears that patient has minimal needs for discharge at this time. Discussed with patient's nurse and requested that case management be notified if discharge needs are identified.     - Current discharge plan is for the patient to return home.    - PCP: Clayburn Pert    Case management will continue to follow progress and update discharge plan as needed.      Risk of Readmission Score: Observation    Blima Dessert, BSN RN  Case Manager  [facility]  Phone: [phone]

## 2022-02-01 NOTE — Plan of Care (Signed)
Problem: Chronic Conditions and Co-morbidities  Goal: Patient's chronic conditions and co-morbidity symptoms are monitored and maintained or improved  Outcome: Progressing     Problem: Discharge Planning  Goal: Discharge to home or other facility with appropriate resources  Outcome: Progressing     Problem: Pain  Goal: Verbalizes/displays adequate comfort level or baseline comfort level  Outcome: Progressing

## 2022-02-01 NOTE — Other (Signed)
RT Inhaler-Nebulizer Bronchodilator Protocol Note    There is a bronchodilator order in the chart from a provider indicating to follow the RT Bronchodilator Protocol and there is an "Initiate RT Inhaler-Nebulizer Bronchodilator Protocol" order as well (see protocol at bottom of note).    CXR Findings:  No results found.    The findings from the last RT Protocol Assessment were as follows:   History Pulmonary Disease: Chronic pulmonary disease  Respiratory Pattern: Regular pattern and RR 12-20 bpm  Breath Sounds: Clear breath sounds  Cough: Strong, spontaneous, non-productive  Indication for Bronchodilator Therapy:    Bronchodilator Assessment Score: 2    Aerosolized bronchodilator medication orders have been revised according to the RT Inhaler-Nebulizer Bronchodilator Protocol below.    Respiratory Therapist to perform RT Therapy Protocol Assessment initially then follow the protocol.  Repeat RT Therapy Protocol Assessment PRN for score 0-3 or on second treatment, BID, and PRN for scores above 3.    No Indications - adjust the frequency to every 6 hours PRN wheezing or bronchospasm, if no treatments needed after 48 hours then discontinue using Per Protocol order mode.     If indication present, adjust the RT bronchodilator orders based on the Bronchodilator Assessment Score as indicated below.  Use Inhaler orders unless patient has one or more of the following: on home nebulizer, not able to hold breath for 10 seconds, is not alert and oriented, cannot activate and use MDI correctly, or respiratory rate 25 breaths per minute or more, then use the equivalent nebulizer order(s) with same Frequency and PRN reasons based on the score.  If a patient is on this medication at home then do not decrease Frequency below that used at home.    0-3 - enter or revise RT bronchodilator order(s) to equivalent RT Bronchodilator order with Frequency of every 4 hours PRN for wheezing or increased work of breathing using Per Protocol  order mode.        4-6 - enter or revise RT Bronchodilator order(s) to two equivalent RT bronchodilator orders with one order with BID Frequency and one order with Frequency of every 4 hours PRN wheezing or increased work of breathing using Per Protocol order mode.        7-10 - enter or revise RT Bronchodilator order(s) to two equivalent RT bronchodilator orders with one order with TID Frequency and one order with Frequency of every 4 hours PRN wheezing or increased work of breathing using Per Protocol order mode.       11-13 - enter or revise RT Bronchodilator order(s) to one equivalent RT bronchodilator order with QID Frequency and an Albuterol order with Frequency of every 4 hours PRN wheezing or increased work of breathing using Per Protocol order mode.      Greater than 13 - enter or revise RT Bronchodilator order(s) to one equivalent RT bronchodilator order with every 4 hours Frequency and an Albuterol order with Frequency of every 2 hours PRN wheezing or increased work of breathing using Per Protocol order mode.     RT to enter RT Home Evaluation for COPD & MDI Assessment order using Per Protocol order mode.    Electronically signed by Jeannie Done, RCP on 02/01/2022 at 5:57 PM

## 2022-02-01 NOTE — Discharge Summary (Signed)
Physician Discharge Summary     Patient ID:  Shelly Richard  [id number]  36 y.o.  [birth year]    Admit date: 02/01/2022    Discharge date:  10/21    Admitting Physician: Laurette Schimke, MD    Discharge Diagnoses: Abnormal uterine bleeding [N93.9]  Uterine leiomyoma, unspecified location [D25.9]    Discharged Condition: STABLE    Procedures Performed: Robotic myomectomy    Hospital Course: Patient was admitted on the day of surgery, and underwent an uncomplicated procedure.  See dictated op note.     Discharge Exam:  Appears well, AVSS, abdomen soft, appropriately tender, nondistended, BS present, incisions clean dry, intact    Disposition: Good    Patient Instructions:   Activity: ambulate in house, no lifting or strenuous exercise for 6 weeks, no sex for 6-8 weeks and no driving while on analgesics    Diet: Regular    Wound Care: Keep wound clean and dry    Discharge Medication:      Medication List        START taking these medications      acetaminophen 500 MG tablet  Commonly known as: TYLENOL  Take 1 tablet by mouth 4 times daily as needed for Pain     oxyCODONE 5 MG immediate release tablet  Commonly known as: Roxicodone  Take 1 tablet by mouth every 6 hours as needed for Pain for up to 7 days. Intended supply: 7 days. Take lowest dose possible to manage pain Max Daily Amount: 20 mg     polyethylene glycol 17 GM/SCOOP powder  Commonly known as: GLYCOLAX  Take 17 g by mouth daily            CHANGE how you take these medications      insulin detemir 100 UNIT/ML injection pen  Commonly known as: Levemir FlexTouch  INJECT 30 UNITS UNDER THE SKIN NIGHTLY  What changed: when to take this            CONTINUE taking these medications      Acidophilus Tabs  TAKE 1 TABLET BY MOUTH EVERY DAY     [employer] Blood Glucose Meter w/Device Kit  Please provide meter approved by insurance.  Check bs qam     albuterol sulfate HFA 108 (90 Base) MCG/ACT inhaler  Commonly known as: PROVENTIL;VENTOLIN;PROAIR  Inhale 2 puffs into the  lungs every 6 hours as needed for Wheezing or Shortness of Breath     blood glucose test strips  Tes2t qam  times a day & as needed for symptoms of irregular blood glucose. Please provide test strips approved by insurance.     cetirizine 10 MG tablet  Commonly known as: ZYRTEC  Take 1 tablet by mouth daily     D3 50 MCG (2000 UT) Tabs  Generic drug: Cholecalciferol  TAKE ONE TABLET BY MOUTH DAILY     dapagliflozin 10 MG tablet  Commonly known as: Farxiga  Take 1 tablet by mouth every morning     EPINEPHrine 0.3 MG/0.3ML Soaj injection  Commonly known as: EPIPEN     FeroSul 325 (65 Fe) MG tablet  Generic drug: ferrous sulfate  TAKE ONE TABLET BY MOUTH DAILY WITH BREAKFAST     * FreeStyle Libre 3 Sensor Misc  Check glucose per diagnosis E11.9     * FreeStyle Libre 2 Sensor Misc  USE TO TEST BLOOD GLUCOSE AS DIRECTED     * FreeStyle Libre 3 Sensor Misc  Check glucose per diagnosis  E11.9     ibuprofen 600 MG tablet  Commonly known as: ADVIL;MOTRIN  Take 1 tablet by mouth every 6 hours as needed for Pain     insulin lispro (1 Unit Dial) 100 UNIT/ML Sopn  Commonly known as: HumaLOG KwikPen  Inject 10 units with meals plus Inject 2 units for BS 150-200, 4 units for BS 201-250, 6 units for BS 251-300, 8 units for BS 301-350, 10 units for BS greater than 350 with meals     * [employer] Pen Needles 29G 29G X 12MM Misc  Generic drug: Insulin Pen Needle  1 each by Does not apply route daily     * [employer] Pen Needles 31G 31G X 8 MM Misc  Generic drug: Insulin Pen Needle  1 each by Does not apply route daily     * Insulin Pen Needle 32G X 6 MM Misc  Once daily     * Insulin Pen Needle 32G X 6 MM Misc  Use four times daily and as needed     Lancets Misc  1 each by Does not apply route daily Please provide lancets approved by insurance.     letrozole 2.5 MG tablet  Commonly known as: FEMARA     levothyroxine 75 MCG tablet  Commonly known as: SYNTHROID  TAKE ONE TABLET BY MOUTH DAILY     losartan 25 MG tablet  Commonly known as:  COZAAR  TAKE ONE TABLET BY MOUTH DAILY     metFORMIN 500 MG tablet  Commonly known as: GLUCOPHAGE  TAKE TWO TABLETS BY MOUTH TWICE A DAY WITH MEALS     montelukast 10 MG tablet  Commonly known as: SINGULAIR  TAKE ONE TABLET BY MOUTH DAILY     Myfembree 40-1-0.5 MG Tabs  Generic drug: Relugolix-Estradiol-Norethind     Ozempic (1 MG/DOSE) 4 MG/3ML Sopn  Generic drug: Semaglutide (1 MG/DOSE)  Inject 1 mg into the skin once a week     simvastatin 40 MG tablet  Commonly known as: ZOCOR  TAKE ONE TABLET BY MOUTH DAILY     Skin Prep Wipes Misc  50 each by Does not apply route every 14 days     valACYclovir 500 MG tablet  Commonly known as: VALTREX  TAKE 1 TABLET BY MOUTH DAILY           * This list has 7 medication(s) that are the same as other medications prescribed for you. Read the directions carefully, and ask your doctor or other care provider to review them with you.                STOP taking these medications      norethindrone 5 MG tablet  Commonly known as: AYGESTIN               Where to Get Your Medications        You can get these medications from any pharmacy    Bring a paper prescription for each of these medications  acetaminophen 500 MG tablet  ibuprofen 600 MG tablet  oxyCODONE 5 MG immediate release tablet  polyethylene glycol 17 GM/SCOOP powder          Follow-up with Laurette Schimke, MD in 1-2 weeks    Signed:  Laurette Schimke, MD  02/01/2022  10:33 AM

## 2022-02-01 NOTE — Plan of Care (Signed)
Problem: Chronic Conditions and Co-morbidities  Goal: Patient's chronic conditions and co-morbidity symptoms are monitored and maintained or improved  02/01/2022 2353 by Lamine Laton, Sandy Salaam, RN  Outcome: Progressing     Problem: Discharge Planning  Goal: Discharge to home or other facility with appropriate resources  02/01/2022 2353 by Rosella Crandell, Sandy Salaam, RN  Outcome: Progressing     Problem: Pain  Goal: Verbalizes/displays adequate comfort level or baseline comfort level  02/01/2022 2353 by Taelyn Broecker, Sandy Salaam, RN  Outcome: Progressing     Problem: ABCDS Injury Assessment  Goal: Absence of physical injury  Outcome: Progressing     Problem: Safety - Adult  Goal: Free from fall injury  Outcome: Progressing

## 2022-02-01 NOTE — Brief Op Note (Signed)
Department of Gynecology  Brief Operative Report        Pre-operative Diagnosis:  fibroid uterus    Post-operative Diagnosis:  same    Procedure:  robotic myomectomy    Surgeon:  Hubbert Landrigan     Assistant(s):  Randy    Anesthesia:  General Endotracheal Anesthesia    Findings:  enlarged fibroid uterus      Urine Output:  900 ml    Estimated blood loss:  162m    Blood Transfusion?:  No     Drains:  none    Specimens:  uterine fibroids    Complications:  none    Condition:  good    See dictated operative report for full details.

## 2022-02-01 NOTE — Progress Notes (Signed)
Admission assessment completed. VSS. Drowsy, oriented x 4. Lap incision sites x 3 to abdomen clean, dry and intact. Call light in reach.

## 2022-02-01 NOTE — Progress Notes (Signed)
Patient drowsy, but responds to voice. O2 sat 98% on nasal cannula. Abd soft, lap sites intact. No vaginal bleeding. Patient  denies pain. Phase 1 recovery completed, will transfer to 4T.

## 2022-02-01 NOTE — Progress Notes (Signed)
VAGINAL SWEEP PER DR. Raelyn Number, NOTHING LEFT IN. 900 ML CLEAR, YELLOW URINE. FOLEY DISCONTINUED WITHOUT DIFFICULTY.

## 2022-02-01 NOTE — Progress Notes (Signed)
Patients pre-op assessment, checklist, H&P, and problem list reviewed during patient interview prior to going to surgery.

## 2022-02-01 NOTE — Progress Notes (Signed)
Patient to PACU from OR. Unresponsive to stimuli. Oral airway in place. Simple mask at 10L O2 sat 90-92%. Abd soft, lap sites intact. Peri pad dry.

## 2022-02-01 NOTE — Anesthesia Post-Procedure Evaluation (Signed)
Department of Anesthesiology  Postprocedure Note    Patient: DAVI ROTAN  MRN: [id number]  Birthdate: [birth year]  Date of evaluation: 02/01/2022      Procedure Summary     Date: 02/01/22 Room / Location: [facility] OR 01 / [facility]    Anesthesia Start: [phone] Anesthesia Stop: 1749    Procedure: ROBOTIC MYOMECTOMY (Abdomen) Diagnosis:       Abnormal uterine bleeding      Uterine leiomyoma, unspecified location      (Abnormal uterine bleeding [N93.9])      (Uterine leiomyoma, unspecified location [D25.9])    Surgeons: Foley, Audrie Gallus, MD Responsible Provider: Levonne Hubert, MD    Anesthesia Type: general ASA Status: 2          Anesthesia Type: No value filed.    Aldrete Phase I: Aldrete Score: 7    Aldrete Phase II:        Anesthesia Post Evaluation    Patient location during evaluation: PACU  Patient participation: complete - patient participated  Level of consciousness: awake  Airway patency: patent  Nausea & Vomiting: no vomiting and no nausea  Complications: no  Cardiovascular status: hemodynamically stable  Respiratory status: acceptable  Hydration status: stable  Multimodal analgesia pain management approach  Pain management: adequate

## 2022-02-02 LAB — CBC WITH AUTO DIFFERENTIAL
Basophils %: 0.3 %
Basophils Absolute: 0 10*3/uL (ref 0.0–0.2)
Eosinophils %: 0 %
Eosinophils Absolute: 0 10*3/uL (ref 0.0–0.6)
Hematocrit: 32.7 % — ABNORMAL LOW (ref 36.0–48.0)
Hemoglobin: 10.7 g/dL — ABNORMAL LOW (ref 12.0–16.0)
Lymphocytes %: 19.1 %
Lymphocytes Absolute: 2.2 10*3/uL (ref 1.0–5.1)
MCH: 28.6 pg (ref 26.0–34.0)
MCHC: 32.8 g/dL (ref 31.0–36.0)
MCV: 87.1 fL (ref 80.0–100.0)
MPV: 9 fL (ref 5.0–10.5)
Monocytes %: 7 %
Monocytes Absolute: 0.8 10*3/uL (ref 0.0–1.3)
Neutrophils %: 73.6 %
Neutrophils Absolute: 8.5 10*3/uL — ABNORMAL HIGH (ref 1.7–7.7)
Platelets: 254 10*3/uL (ref 135–450)
RBC: 3.75 M/uL — ABNORMAL LOW (ref 4.00–5.20)
RDW: 14.4 % (ref 12.4–15.4)
WBC: 11.6 10*3/uL — ABNORMAL HIGH (ref 4.0–11.0)

## 2022-02-02 LAB — BASIC METABOLIC PANEL
Anion Gap: 12 (ref 3–16)
BUN: 15 mg/dL (ref 7–20)
CO2: 19 mmol/L — ABNORMAL LOW (ref 21–32)
Calcium: 8 mg/dL — ABNORMAL LOW (ref 8.3–10.6)
Chloride: 107 mmol/L (ref 99–110)
Creatinine: 0.7 mg/dL (ref 0.6–1.1)
Est, Glom Filt Rate: >60 (ref >60)
Glucose: 208 mg/dL — ABNORMAL HIGH (ref 70–99)
Potassium: 3.9 mmol/L (ref 3.5–5.1)
Sodium: 138 mmol/L (ref 136–145)

## 2022-02-02 LAB — POCT GLUCOSE
POC Glucose: 178 mg/dl — ABNORMAL HIGH (ref 70–99)
POC Glucose: 193 mg/dl — ABNORMAL HIGH (ref 70–99)

## 2022-02-02 MED FILL — MELATONIN 3 MG PO TABS: 3 mg | ORAL | Qty: 1

## 2022-02-02 MED FILL — ACYCLOVIR 200 MG PO CAPS: 200 mg | ORAL | Qty: 2

## 2022-02-02 MED FILL — ACETAMINOPHEN EXTRA STRENGTH 500 MG PO TABS: 500 mg | ORAL | Qty: 2

## 2022-02-02 MED FILL — OXYCODONE HCL 5 MG PO TABS: 5 mg | ORAL | Qty: 1

## 2022-02-02 MED FILL — IBUPROFEN 600 MG PO TABS: 600 mg | ORAL | Qty: 1

## 2022-02-02 MED FILL — LANTUS 100 UNIT/ML SC SOLN: 100 [IU]/mL | SUBCUTANEOUS | Qty: 30

## 2022-02-02 MED FILL — ATORVASTATIN CALCIUM 20 MG PO TABS: 20 mg | ORAL | Qty: 1

## 2022-02-02 MED FILL — MONTELUKAST SODIUM 10 MG PO TABS: 10 mg | ORAL | Qty: 1

## 2022-02-02 MED FILL — METFORMIN HCL 500 MG PO TABS: 500 mg | ORAL | Qty: 2

## 2022-02-02 MED FILL — LOVENOX 40 MG/0.4ML IJ SOSY: 40 MG/0.4ML | INTRAMUSCULAR | Qty: 0.4

## 2022-02-02 MED FILL — CETIRIZINE HCL 10 MG PO TABS: 10 mg | ORAL | Qty: 1

## 2022-02-02 MED FILL — LEVOTHYROXINE SODIUM 75 MCG PO TABS: 75 ug | ORAL | Qty: 1

## 2022-02-02 MED FILL — HEALTHYLAX 17 G PO PACK: 17 g | ORAL | Qty: 1

## 2022-02-02 MED FILL — LOSARTAN POTASSIUM 25 MG PO TABS: 25 mg | ORAL | Qty: 1

## 2022-02-02 NOTE — Discharge Instructions (Signed)
Laparoscopic Myomectomy: What to Expect at [facility]  Laparoscopic myomectomy is surgery to remove one or more fibroids. Your doctor put a lighted tube (scope) and other tools through small cuts (incisions) in your belly. The doctor then removed the fibroids.  After surgery, you may feel some pain in your belly for several days. Your belly may also be swollen. You may have a change in your bowel movements for a few days. And you may have some cramping for the first week.  It's normal to also have some shoulder or back pain. This is caused by the gas your doctor put in your belly to help see your organs better.  Your doctor may prescribe medicines for pain. You may need about 1 to 2 weeks to fully recover. It's important not to lift anything heavy for about 1 week. Your doctor may talk to you about when you can have sex and when it's safe to try to become pregnant.  You may have a brown or reddish brown vaginal discharge or light vaginal bleeding or spotting for a few weeks. This is normal. Expect your first two periods to start early or late. They may be more painful or heavy than usual.  This care sheet gives you a general idea about how long it will take for you to recover. But each person recovers at a different pace. Follow the steps below to get better as quickly as possible.  How can you care for yourself at home?  Activity    Rest when you feel tired.     Be active. Walking is a good choice.     Allow your body to heal. Don't move quickly or lift anything heavy until you are feeling better.     Ask your doctor when it is okay for you to have sex or use tampons. Do not douche.     Hold a pillow over your incisions when you cough or take deep breaths. This will support your belly and may help to decrease your pain.     Do breathing exercises at home as instructed by your doctor. This will help prevent pneumonia.   Diet    You can eat your normal diet. If your stomach is upset, try bland, low-fat  foods like plain rice, broiled chicken, toast, and yogurt.     If your bowel movements are not regular right after surgery, try to avoid constipation and straining. Drink plenty of water. Your doctor may suggest fiber, a stool softener, or a mild laxative.   Medicines    Be safe with medicines. Read and follow all instructions on the label.  If the doctor gave you a prescription medicine for pain, take it as prescribed.  If you are not taking a prescription pain medicine, ask your doctor if you can take an over-the-counter medicine.     Your doctor will tell you if and when you can restart your medicines. You will also get instructions about taking any new medicines.     If you stopped taking aspirin or some other blood thinner, your doctor will tell you when to start taking it again.   Incision care    If you have strips of tape on the cut (incision) the doctor made, leave the tape on for a week or until it falls off.     If you have skin adhesive on the incision, leave it on until it falls off. Skin adhesive is also called liquid stitches.  Wash the area daily with warm, soapy water, and pat it dry. Don't use hydrogen peroxide or alcohol. They can slow healing.     You may cover the area with a gauze bandage if it oozes fluid or rubs against clothing.     Change the bandage every day.     Keep the area clean and dry.   Other instructions    Talk to your doctor if you want to try to get pregnant soon. Your doctor can tell you when it's safe to do so. If you don't want to get pregnant, talk with your doctor about birth control.     Wear loose, comfortable clothing. For a few weeks, avoid anything that puts pressure on your belly.     You may have some light vaginal bleeding. Wear sanitary pads if needed.     You may want to use a heating pad on your belly to help with pain.   Follow-up care is a key part of your treatment and safety. Be sure to make and go to all appointments, and call your doctor if you are  having problems. It's also a good idea to know your test results and keep a list of the medicines you take.  When should you call for help?   Call 911 anytime you think you may need emergency care. For example, call if:    You passed out (lost consciousness).     You have chest pain, are short of breath, or cough up blood.   Call your doctor now or seek immediate medical care if:    You have pain that does not get better after you take pain medicine.     You cannot pass stools or gas.     You have vaginal discharge that has increased in amount or smells bad.     You are sick to your stomach or cannot drink fluids.     You have loose stitches, or your incision comes open.     Bright red blood has soaked through the bandage over your incision.     You have signs of infection, such as:  Increased pain, swelling, warmth, or redness.  Red streaks leading from the incision.  Pus draining from the incision.  A fever.     You have severe vaginal bleeding. This means that you are soaking through your usual pads every hour for 2 or more hours.     You have signs of a blood clot in your leg (called a deep vein thrombosis), such as:  Pain in your calf, back of the knee, thigh, or groin.  Redness and swelling in your leg.   Watch closely for changes in your health, and be sure to contact your doctor if you have any problems.  Where can you learn more?  Go to https://www.bennett.info/ and enter K537 to learn more about "Laparoscopic Myomectomy: What to Expect at Home."  Current as of: August 01, 2021               Content Version: 13.8   2006-2023 [employer].   Care instructions adapted under license by [facility]. If you have questions about a medical condition or this instruction, always ask your healthcare professional. [employer] any warranty or liability for your use of this information.

## 2022-02-02 NOTE — Progress Notes (Signed)
Patient provided with discharge instructions, discussed in detail, new medications reviewed including use and side effects.  Patient verbalized understanding. No questions at this time. New prescriptions to be filled at patient's primary pharmacy. All questions answered, family at the bedside to transport home. IV removed w/o complications. Ice pack for abdomen provided. Medicated with PRN oxycodone per patient request. Patient wheeled out to husband's personal vehicle.

## 2022-02-02 NOTE — Plan of Care (Signed)
Problem: Chronic Conditions and Co-morbidities  Goal: Patient's chronic conditions and co-morbidity symptoms are monitored and maintained or improved    Outcome: Progressing     Problem: Discharge Planning  Goal: Discharge to home or other facility with appropriate resources  Outcome: Progressing     Problem: Pain  Goal: Verbalizes/displays adequate comfort level or baseline comfort level  Outcome: Progressing     Problem: ABCDS Injury Assessment  Goal: Absence of physical injury  Outcome: Progressing     Problem: Safety - Adult  Goal: Free from fall injury  Outcome: Progressing      Okay to discharge home

## 2022-02-02 NOTE — Progress Notes (Signed)
Surgery Post-op note    Post-op Day #1    Subjective:    Ambulating, tolerating PO, denies nausea, voiding, has had flatus, appropriate pain control with meds    Objective:  Vitals:    02/01/22 1730 02/01/22 2131 02/02/22 0036 02/02/22 0530   BP: 130/81 116/82 113/66 99/65   Pulse: 94 (!) 105 (!) 107 96   Resp: '16 16 16 16   '$ Temp: 98.1 F (36.7 C) 98.5 F (36.9 C) 98.3 F (36.8 C) 98.1 F (36.7 C)   TempSrc: Oral Axillary Oral Oral   SpO2: 96% 96% 96% 96%   Weight:       Height:           Abdominal exam: soft, nontender, nondistended, no masses or organomegaly. BS present    Wound: well approximated incision    Labs:    Recent Labs     02/02/22  0510   WBC 11.6*   HGB 10.7*   HCT 32.7*   PLT 254     Recent Labs     02/02/22  0510   NA 138   K 3.9   CL 107   CO2 19*   BUN 15   CREATININE 0.7   CALCIUM 8.0*          Current Facility-Administered Medications:     albuterol sulfate HFA (PROVENTIL;VENTOLIN;PROAIR) 108 (90 Base) MCG/ACT inhaler 2 puff, 2 puff, Inhalation, Q6H PRN, Hayes Rehfeldt, Audrie Gallus, MD    cetirizine (ZYRTEC) tablet 10 mg, 10 mg, Oral, Daily, Myquan Schaumburg, Audrie Gallus, MD, 10 mg at 02/01/22 1457    metFORMIN (GLUCOPHAGE) tablet 1,000 mg, 1,000 mg, Oral, BID with meals, Aleane Wesenberg, Audrie Gallus, MD, 1,000 mg at 02/01/22 1746    montelukast (SINGULAIR) tablet 10 mg, 10 mg, Oral, Daily, Reegan Bouffard, Audrie Gallus, MD, 10 mg at 02/01/22 1456    sodium chloride flush 0.9 % injection 5-40 mL, 5-40 mL, IntraVENous, 2 times per day, Leng Montesdeoca, Audrie Gallus, MD, 10 mL at 02/01/22 2200    sodium chloride flush 0.9 % injection 5-40 mL, 5-40 mL, IntraVENous, PRN, Kenji Mapel, Audrie Gallus, MD    0.9 % sodium chloride infusion, , IntraVENous, PRN, Sheritha Louis, Audrie Gallus, MD    ondansetron (ZOFRAN-ODT) disintegrating tablet 4 mg, 4 mg, Oral, Q8H PRN **OR** ondansetron (ZOFRAN) injection 4 mg, 4 mg, IntraVENous, Q6H PRN, Teah Votaw, Audrie Gallus, MD    0.9 % sodium chloride infusion, , IntraVENous, Continuous, Lawana Hartzell, Audrie Gallus, MD, Last Rate: 125 mL/hr at  02/02/22 0524, New Bag at 02/02/22 0524    acetaminophen (TYLENOL) tablet 1,000 mg, 1,000 mg, Oral, 3 times per day, Nazyia Gaugh, Audrie Gallus, MD, 1,000 mg at 02/02/22 4696    ibuprofen (ADVIL;MOTRIN) tablet 600 mg, 600 mg, Oral, q8h, Cuma Polyakov, Audrie Gallus, MD, 600 mg at 02/01/22 1746    oxyCODONE (ROXICODONE) immediate release tablet 5 mg, 5 mg, Oral, Q4H PRN, 5 mg at 02/02/22 0533 **OR** oxyCODONE (ROXICODONE) immediate release tablet 10 mg, 10 mg, Oral, Q4H PRN, Mykenzi Vanzile, Audrie Gallus, MD    HYDROmorphone HCl PF (DILAUDID) injection 1 mg, 1 mg, IntraVENous, Q3H PRN, Malaiya Paczkowski, Audrie Gallus, MD    melatonin tablet 3 mg, 3 mg, Oral, Nightly PRN, Amor Packard, Audrie Gallus, MD, 3 mg at 02/01/22 2131    polyethylene glycol (GLYCOLAX) packet 17 g, 17 g, Oral, Daily, Marri Mcneff, Audrie Gallus, MD, 17 g at 02/01/22 1456    senna (SENOKOT) tablet 8.6 mg, 8.6 mg, Oral, BID PRN, Cillian Gwinner, Audrie Gallus, MD    magnesium hydroxide (MILK  OF MAGNESIA) 400 MG/5ML suspension 30 mL, 30 mL, Oral, Daily PRN, Reshard Guillet, Audrie Gallus, MD    enoxaparin (LOVENOX) injection 40 mg, 40 mg, SubCUTAneous, Daily, Joliana Claflin, Audrie Gallus, MD, 40 mg at 02/01/22 1456    insulin lispro (HUMALOG) injection vial 0-16 Units, 0-16 Units, SubCUTAneous, TID WC, Keena Heesch, Audrie Gallus, MD, 4 Units at 02/01/22 1746    insulin lispro (HUMALOG) injection vial 0-4 Units, 0-4 Units, SubCUTAneous, Nightly, Sybella Harnish, Audrie Gallus, MD    dextrose bolus 10% 125 mL, 125 mL, IntraVENous, PRN **OR** dextrose bolus 10% 250 mL, 250 mL, IntraVENous, PRN, Thane Age, Audrie Gallus, MD    glucagon injection 1 mg, 1 mg, SubCUTAneous, PRN, Kourtney Terriquez, Audrie Gallus, MD    dextrose 10 % infusion, , IntraVENous, Continuous PRN, Adi Seales, Audrie Gallus, MD    insulin glargine (LANTUS) injection vial 30 Units, 30 Units, SubCUTAneous, QAM AC, Kameren Pargas, Audrie Gallus, MD, 30 Units at 02/02/22 0800    levothyroxine (SYNTHROID) tablet 75 mcg, 75 mcg, Oral, Daily, Shantea Poulton, Audrie Gallus, MD, 75 mcg at 02/02/22 0527    losartan (COZAAR) tablet 25 mg, 25 mg, Oral,  Daily, Shaun Zuccaro, Audrie Gallus, MD, 25 mg at 02/01/22 1456    atorvastatin (LIPITOR) tablet 20 mg, 20 mg, Oral, Daily, Shloimy Michalski, Audrie Gallus, MD, 20 mg at 02/01/22 1457    acyclovir (ZOVIRAX) capsule 400 mg, 400 mg, Oral, BID, Shateria Paternostro, Audrie Gallus, MD, 400 mg at 02/01/22 2131     Assessment/Plan:      Doing well. Routine p.o. Care.    Diet: General/Regular    Discharge today: Yes    Follow up:

## 2022-02-02 NOTE — Care Coordination-Inpatient (Signed)
Patient discharged 02/02/2022 to home  All discharge needs met per case management     Sherril Cong, RN, BSN  [phone]

## 2022-02-06 DIAGNOSIS — R059 Cough, unspecified: Secondary | ICD-10-CM | POA: Diagnosis not present

## 2022-02-06 DIAGNOSIS — Z681 Body mass index (BMI) 19 or less, adult: Secondary | ICD-10-CM | POA: Diagnosis not present

## 2022-02-06 DIAGNOSIS — J069 Acute upper respiratory infection, unspecified: Secondary | ICD-10-CM | POA: Diagnosis not present

## 2022-02-10 DIAGNOSIS — J0100 Acute maxillary sinusitis, unspecified: Secondary | ICD-10-CM | POA: Diagnosis not present

## 2022-02-10 DIAGNOSIS — Z681 Body mass index (BMI) 19 or less, adult: Secondary | ICD-10-CM | POA: Diagnosis not present

## 2022-03-05 DIAGNOSIS — R059 Cough, unspecified: Secondary | ICD-10-CM | POA: Diagnosis not present

## 2022-03-05 DIAGNOSIS — Z681 Body mass index (BMI) 19 or less, adult: Secondary | ICD-10-CM | POA: Diagnosis not present

## 2022-03-11 NOTE — Telephone Encounter (Signed)
Requested Prescriptions     Pending Prescriptions Disp Refills    cetirizine (ZYRTEC) 10 MG tablet [Pharmacy Med Name: CETIRIZINE HCL 10 MG TABLET] 30 tablet 1     Sig: TAKE 1 TABLET BY MOUTH DAILY    Cholecalciferol (VITAMIN D3) 50 MCG (2000 UT) TABS [Pharmacy Med Name: VITAMIN D3 50 MCG TABLET] 30 tablet 3     Sig: TAKE ONE TABLET BY MOUTH DAILY          Last Office Visit: 01/22/2022     Next Office Visit: 04/24/2022     Last Labs: 01/25/2022

## 2022-03-12 MED ORDER — CETIRIZINE HCL 10 MG PO TABS
10 | ORAL_TABLET | Freq: Every day | ORAL | 1 refills | 30.00000 days | Status: DC
Start: 2022-03-12 — End: 2022-05-22

## 2022-03-12 MED ORDER — VITAMIN D3 50 MCG (2000 UT) PO TABS
50 | ORAL_TABLET | ORAL | 3 refills | 30.00000 days | Status: DC
Start: 2022-03-12 — End: 2023-11-26

## 2022-03-27 DIAGNOSIS — M3322 Polymyositis with myopathy: Secondary | ICD-10-CM | POA: Diagnosis not present

## 2022-03-28 DIAGNOSIS — M3322 Polymyositis with myopathy: Secondary | ICD-10-CM | POA: Diagnosis not present

## 2022-03-29 DIAGNOSIS — M3322 Polymyositis with myopathy: Secondary | ICD-10-CM | POA: Diagnosis not present

## 2022-04-22 NOTE — Telephone Encounter (Signed)
Lov 01-22-22  Fov 05-22-22

## 2022-04-23 MED ORDER — INSULIN DETEMIR 100 UNIT/ML SC SOPN
100 | SUBCUTANEOUS | 3 refills | 38.50000 days | Status: DC
Start: 2022-04-23 — End: 2022-05-22

## 2022-04-23 MED ORDER — METFORMIN HCL 500 MG PO TABS
500 | ORAL_TABLET | ORAL | 1 refills | Status: DC
Start: 2022-04-23 — End: 2022-05-22

## 2022-04-24 ENCOUNTER — Encounter: Payer: PRIVATE HEALTH INSURANCE | Attending: Oncology | Primary: Oncology

## 2022-04-29 NOTE — Telephone Encounter (Signed)
Requested Prescriptions     Pending Prescriptions Disp Refills    insulin lispro, 1 Unit Dial, (HUMALOG/ADMELOG) 100 UNIT/ML SOPN [Pharmacy Med Name: INSULIN LISPRO 100 UNIT/ML PEN] 12 mL      Sig: INJECT 10 UNITS UNDER THE SKIN WITH MEALS PLUS INJECT 2 UNITS FOR BLOOD SUGAR  150-200, 4 UNITS IF 201-250, 6 UNITS IF 251-300, 8 UNITS IF 301-350, 10 UNITS IF GREATER THAN 350          Last Office Visit: 01/22/2022     Next Office Visit: 05/22/2022

## 2022-04-30 MED ORDER — INSULIN LISPRO (1 UNIT DIAL) 100 UNIT/ML SC SOPN
100 | SUBCUTANEOUS | 3 refills | 75.00000 days | Status: DC
Start: 2022-04-30 — End: 2023-12-01

## 2022-05-15 DIAGNOSIS — M351 Other overlap syndromes: Secondary | ICD-10-CM | POA: Diagnosis not present

## 2022-05-15 DIAGNOSIS — Z79899 Other long term (current) drug therapy: Secondary | ICD-10-CM | POA: Diagnosis not present

## 2022-05-22 ENCOUNTER — Ambulatory Visit: Admit: 2022-05-22 | Discharge: 2022-05-22 | Payer: PRIVATE HEALTH INSURANCE | Attending: Oncology | Primary: Oncology

## 2022-05-22 DIAGNOSIS — Z794 Long term (current) use of insulin: Secondary | ICD-10-CM

## 2022-05-22 LAB — POCT GLYCOSYLATED HEMOGLOBIN (HGB A1C): Hemoglobin A1C: 8.4 %

## 2022-05-22 MED ORDER — SIMVASTATIN 40 MG PO TABS
40 | ORAL_TABLET | Freq: Every day | ORAL | 1 refills | Status: DC
Start: 2022-05-22 — End: 2024-04-20

## 2022-05-22 MED ORDER — CETIRIZINE HCL 10 MG PO TABS
10 | ORAL_TABLET | Freq: Every day | ORAL | 1 refills | 30.00000 days | Status: DC
Start: 2022-05-22 — End: 2024-04-20

## 2022-05-22 MED ORDER — OZEMPIC (0.25 OR 0.5 MG/DOSE) 2 MG/3ML SC SOPN
2 | SUBCUTANEOUS | 0 refills | 30.00000 days | Status: DC
Start: 2022-05-22 — End: 2022-08-06

## 2022-05-22 MED ORDER — INSULIN DETEMIR 100 UNIT/ML SC SOPN
100 | SUBCUTANEOUS | 3 refills | 38.50000 days | Status: DC
Start: 2022-05-22 — End: 2022-08-27

## 2022-05-22 MED ORDER — MONTELUKAST SODIUM 10 MG PO TABS
10 | ORAL_TABLET | Freq: Every day | ORAL | 1 refills | 90.00000 days | Status: DC
Start: 2022-05-22 — End: 2024-04-20

## 2022-05-22 MED ORDER — METFORMIN HCL 500 MG PO TABS
500 | ORAL_TABLET | Freq: Two times a day (BID) | ORAL | 1 refills | Status: DC
Start: 2022-05-22 — End: 2022-08-06

## 2022-05-22 MED ORDER — LEVOTHYROXINE SODIUM 75 MCG PO TABS
75 | ORAL_TABLET | ORAL | 1 refills | Status: DC
Start: 2022-05-22 — End: 2024-04-07

## 2022-05-22 MED ORDER — FREESTYLE LIBRE 3 SENSOR MISC
3 refills | Status: DC
Start: 2022-05-22 — End: 2023-11-25

## 2022-05-22 MED ORDER — FUROSEMIDE 20 MG PO TABS
20 | ORAL_TABLET | Freq: Every day | ORAL | 3 refills | Status: DC | PRN
Start: 2022-05-22 — End: 2024-04-20

## 2022-05-22 MED ORDER — DAPAGLIFLOZIN PROPANEDIOL 10 MG PO TABS
10 | ORAL_TABLET | Freq: Every morning | ORAL | 1 refills | 30.00000 days | Status: DC
Start: 2022-05-22 — End: 2024-04-20

## 2022-05-23 NOTE — Assessment & Plan Note (Signed)
Resolved following hysterectomy

## 2022-05-23 NOTE — Assessment & Plan Note (Signed)
Unclear control  A1c 8.4  Encouraged her to use CGM consistently  Encouraged her to focus on diet  Will decrease Ozempic 0.5 mg in an effort to reduce side effects  Continue Levemir and Humalog at current dose for now.  If blood sugars remain elevated we will need to increase Levemir  Continue metformin

## 2022-05-23 NOTE — Assessment & Plan Note (Signed)
Well-controlled, continue current medications

## 2022-05-23 NOTE — Progress Notes (Signed)
Shelly Richard (DOB:  1986/03/16) is a 37 y.o. female,Established patient, here for evaluation of the following chief complaint(s):  3 Month Follow-Up      ASSESSMENT/PLAN:  1. Type 2 diabetes mellitus without complication, with long-term current use of insulin (HCC)  Assessment & Plan:  Unclear control  A1c 8.4  Encouraged her to use CGM consistently  Encouraged her to focus on diet  Will decrease Ozempic 0.5 mg in an effort to reduce side effects  Continue Levemir and Humalog at current dose for now.  If blood sugars remain elevated we will need to increase Levemir  Continue metformin  Orders:  -     POCT glycosylated hemoglobin (Hb A1C)  2. Mild intermittent asthma without complication  -     montelukast (SINGULAIR) 10 MG tablet; Take 1 tablet by mouth daily, Disp-90 tablet, R-1Normal  3. Allergic rhinitis, unspecified seasonality, unspecified trigger  -     montelukast (SINGULAIR) 10 MG tablet; Take 1 tablet by mouth daily, Disp-90 tablet, R-1Normal  4. Essential hypertension  Assessment & Plan:   Well-controlled, continue current medications  5. Abnormal uterine bleeding  Assessment & Plan:  Resolved following hysterectomy      No follow-ups on file.    SUBJECTIVE/OBJECTIVE:  HPI  Here for diabetes follow-up.  She has not been tracking her blood sugars.  She has been out of her CGM sensor.  She is also been out of some of her medications over the last 2 weeks.  Diet has been fair.  She does not always follow diabetic diet.  She does complain of abdominal bloating and belching.  She stopped taking her medicine following her surgery for several weeks and noticed that she did not have any of the symptoms when she was not on her medication.  He is wondering which medication is causing the symptoms and if we can adjust.    Current Outpatient Medications   Medication Sig Dispense Refill    dapagliflozin (FARXIGA) 10 MG tablet Take 1 tablet by mouth every morning 90 tablet 1    simvastatin (ZOCOR) 40 MG tablet Take  1 tablet by mouth daily 90 tablet 1    cetirizine (ZYRTEC) 10 MG tablet Take 1 tablet by mouth daily 90 tablet 1    levothyroxine (SYNTHROID) 75 MCG tablet TAKE ONE TABLET BY MOUTH DAILY 90 tablet 1    montelukast (SINGULAIR) 10 MG tablet Take 1 tablet by mouth daily 90 tablet 1    metFORMIN (GLUCOPHAGE) 500 MG tablet Take 2 tablets by mouth 2 times daily (with meals) 180 tablet 1    Semaglutide,0.25 or 0.'5MG'$ /DOS, (OZEMPIC, 0.25 OR 0.5 MG/DOSE,) 2 MG/3ML SOPN Inject 0.5 mg into the skin once a week 3 mL 0    insulin detemir (LEVEMIR FLEXPEN) 100 UNIT/ML injection pen INJECT 20 UNITS UNDER THE SKIN ONCE NIGHTLY 6 mL 3    Continuous Blood Gluc Sensor (FREESTYLE LIBRE 3 SENSOR) MISC Check glucose per diagnosis E11.9 6 each 3    furosemide (LASIX) 20 MG tablet Take 1 tablet by mouth daily as needed (swelling) 60 tablet 3    insulin lispro, 1 Unit Dial, (HUMALOG/ADMELOG) 100 UNIT/ML SOPN INJECT 10 UNITS UNDER THE SKIN WITH MEALS PLUS INJECT 2 UNITS FOR BLOOD SUGAR  150-200, 4 UNITS IF 201-250, 6 UNITS IF 251-300, 8 UNITS IF 301-350, 10 UNITS IF GREATER THAN 350 12 mL 3    Cholecalciferol (VITAMIN D3) 50 MCG (2000 UT) TABS TAKE ONE TABLET BY MOUTH DAILY 30 tablet  3    Continuous Blood Gluc Sensor (FREESTYLE LIBRE 2 SENSOR) MISC USE TO TEST BLOOD GLUCOSE AS DIRECTED 14 each 2    FEROSUL 325 (65 Fe) MG tablet TAKE ONE TABLET BY MOUTH DAILY WITH BREAKFAST 30 tablet 2    letrozole (FEMARA) 2.5 MG tablet       losartan (COZAAR) 25 MG tablet TAKE ONE TABLET BY MOUTH DAILY 90 tablet 1    Insulin Pen Needle 32G X 6 MM MISC Use four times daily and as needed 100 each 5    Insulin Pen Needle 32G X 6 MM MISC Once daily 90 each 2    EPINEPHrine (EPIPEN) 0.3 MG/0.3ML SOAJ injection       blood glucose monitor strips Tes2t qam  times a day & as needed for symptoms of irregular blood glucose. Please provide test strips approved by insurance. 100 strip 2    albuterol sulfate HFA 108 (90 Base) MCG/ACT inhaler Inhale 2 puffs into the lungs  every 6 hours as needed for Wheezing or Shortness of Breath 1 Inhaler 5    Insulin Pen Needle ([employer] PEN NEEDLES 31G) 31G X 8 MM MISC 1 each by Does not apply route daily 100 each 3    Insulin Pen Needle ([employer] PEN NEEDLES 29G) 29G X 12MM MISC 1 each by Does not apply route daily 100 each 3    valACYclovir (VALTREX) 500 MG tablet TAKE 1 TABLET BY MOUTH DAILY 90 tablet 5    Lancets MISC 1 each by Does not apply route daily Please provide lancets approved by insurance. 100 each 3    Blood Glucose Monitoring Suppl (ACURA BLOOD GLUCOSE METER) w/Device KIT Please provide meter approved by insurance.  Check bs qam 1 kit 0    ibuprofen (ADVIL;MOTRIN) 600 MG tablet Take 1 tablet by mouth every 6 hours as needed for Pain 120 tablet 0    acetaminophen (TYLENOL) 500 MG tablet Take 1 tablet by mouth 4 times daily as needed for Pain 120 tablet 0    Continuous Blood Gluc Sensor (FREESTYLE LIBRE 3 SENSOR) MISC Check glucose per diagnosis E11.9 (Patient not taking: Reported on 01/22/2022) 6 each 3    Continuous Blood Gluc Sensor (FREESTYLE LIBRE 3 SENSOR) MISC Check glucose per diagnosis E11.9 (Patient not taking: Reported on 01/22/2022) 6 each 3     No current facility-administered medications for this visit.       Review of Systems   Constitutional:  Negative for chills, fatigue and fever.   HENT:  Negative for congestion, sinus pressure and sinus pain.    Respiratory:  Negative for cough, shortness of breath and wheezing.    Cardiovascular:  Negative for chest pain and palpitations.   Gastrointestinal:  Positive for abdominal distention. Negative for abdominal pain, constipation and diarrhea.        Belching   Musculoskeletal:  Negative for arthralgias, back pain and myalgias.   Skin:  Negative for color change, pallor and rash.   Neurological:  Negative for dizziness, syncope, weakness, light-headedness and headaches.   Psychiatric/Behavioral:  Negative for behavioral problems, confusion and sleep disturbance. The patient is  not nervous/anxious.        Vitals:    05/22/22 1556   BP: 120/76   Site: Right Upper Arm   Position: Sitting   Cuff Size: Large Adult   Pulse: 93   Temp: 98.5 F (36.9 C)   SpO2: 97%   Weight: 92.5 kg (204 lb)   Height: 1.626 m (5'  4")       Physical Exam  Constitutional:       Appearance: She is well-developed.   HENT:      Head: Normocephalic and atraumatic.   Eyes:      Conjunctiva/sclera: Conjunctivae normal.      Pupils: Pupils are equal, round, and reactive to light.   Neck:      Thyroid: No thyromegaly.      Vascular: No JVD.   Cardiovascular:      Rate and Rhythm: Normal rate and regular rhythm.      Heart sounds: Normal heart sounds.   Pulmonary:      Effort: Pulmonary effort is normal. No respiratory distress.      Breath sounds: Normal breath sounds. No wheezing.   Musculoskeletal:         General: Normal range of motion.      Cervical back: Normal range of motion and neck supple.   Skin:     General: Skin is warm and dry.      Capillary Refill: Capillary refill takes less than 2 seconds.   Neurological:      Mental Status: She is alert and oriented to person, place, and time.   Psychiatric:         Behavior: Behavior normal.                 An electronic signature was used to authenticate this note.    --Herbert Deaner, [name]

## 2022-05-24 NOTE — Telephone Encounter (Signed)
Submitted PA for Ozempic (0.25 or 0.5 MG/DOSE) '2MG'$ /3ML pen-injectors  Via CMM Key: [id number] STATUS: PENDING.    Follow up done daily; if no decision with in three days we will refax.  If another three days goes by with no decision will call the insurance for status.

## 2022-05-27 NOTE — Telephone Encounter (Signed)
APPROVAL for Ozempic (0.25 or 0.5 MG/DOSE) '2MG'$ /3ML pen-injectors 05/24/22-05/23/23; letter attached.    If this requires a response please respond to the pool ( [facility] [place]).      Thank you please advise patient.

## 2022-05-29 DIAGNOSIS — I7300 Raynaud's syndrome without gangrene: Secondary | ICD-10-CM | POA: Diagnosis not present

## 2022-05-29 DIAGNOSIS — I272 Pulmonary hypertension, unspecified: Secondary | ICD-10-CM | POA: Diagnosis not present

## 2022-05-29 DIAGNOSIS — Z7952 Long term (current) use of systemic steroids: Secondary | ICD-10-CM | POA: Diagnosis not present

## 2022-05-29 DIAGNOSIS — Z23 Encounter for immunization: Secondary | ICD-10-CM | POA: Diagnosis not present

## 2022-05-29 DIAGNOSIS — J849 Interstitial pulmonary disease, unspecified: Secondary | ICD-10-CM | POA: Diagnosis not present

## 2022-05-29 DIAGNOSIS — M797 Fibromyalgia: Secondary | ICD-10-CM | POA: Diagnosis not present

## 2022-05-29 DIAGNOSIS — Z79899 Other long term (current) drug therapy: Secondary | ICD-10-CM | POA: Diagnosis not present

## 2022-05-29 DIAGNOSIS — L942 Calcinosis cutis: Secondary | ICD-10-CM | POA: Diagnosis not present

## 2022-05-29 DIAGNOSIS — M351 Other overlap syndromes: Secondary | ICD-10-CM | POA: Diagnosis not present

## 2022-06-07 DIAGNOSIS — M3322 Polymyositis with myopathy: Secondary | ICD-10-CM | POA: Diagnosis not present

## 2022-06-07 MED ORDER — MOUNJARO 2.5 MG/0.5ML SC SOPN
2.5 | SUBCUTANEOUS | 1 refills | Status: DC
Start: 2022-06-07 — End: 2022-08-27

## 2022-06-07 MED ORDER — FREESTYLE LIBRE 2 SENSOR MISC
1 refills | 30.00000 days | Status: DC
Start: 2022-06-07 — End: 2023-12-02

## 2022-06-07 NOTE — Telephone Encounter (Signed)
Can try mounjaro. If it gives her same side effects then we will need to stay away from the injectables. She has tried multiple.   Will send the [place] 2

## 2022-06-07 NOTE — Telephone Encounter (Signed)
Trying Ozempic without Metformin  Doesn't think her body likes the Ozempic  Really gassy to the point of pain  Can she try something else?    Instead of Libre 3, can she get [place] 2?  The [place] 3 scans but doesn't log

## 2022-06-07 NOTE — Telephone Encounter (Signed)
Spoke to pt and relayed information regarding medication refills. Pt confirmed understanding regarding switch to [facility] and possible insurance formulary conflicts. Will update pt as PA is updated. Pt otherwise denies any additional questions or concerns.

## 2022-06-10 DIAGNOSIS — M3322 Polymyositis with myopathy: Secondary | ICD-10-CM | POA: Diagnosis not present

## 2022-06-11 DIAGNOSIS — M3322 Polymyositis with myopathy: Secondary | ICD-10-CM | POA: Diagnosis not present

## 2022-07-08 NOTE — Telephone Encounter (Signed)
LOV: 05/22/2022  FOV: 08/27/2022

## 2022-07-09 MED ORDER — DROPLET PEN NEEDLES 32G X 6 MM MISC
3 refills | 30.00000 days | Status: AC
Start: 2022-07-09 — End: ?

## 2022-07-16 DIAGNOSIS — I7300 Raynaud's syndrome without gangrene: Secondary | ICD-10-CM | POA: Diagnosis not present

## 2022-07-16 DIAGNOSIS — I781 Nevus, non-neoplastic: Secondary | ICD-10-CM | POA: Diagnosis not present

## 2022-07-16 DIAGNOSIS — L942 Calcinosis cutis: Secondary | ICD-10-CM | POA: Diagnosis not present

## 2022-07-16 DIAGNOSIS — M351 Other overlap syndromes: Secondary | ICD-10-CM | POA: Diagnosis not present

## 2022-08-06 MED ORDER — METFORMIN HCL 500 MG PO TABS
500 | ORAL_TABLET | Freq: Two times a day (BID) | ORAL | 1 refills | Status: DC
Start: 2022-08-06 — End: 2023-11-25

## 2022-08-06 MED ORDER — FERROUS SULFATE 325 (65 FE) MG PO TABS
325 | ORAL_TABLET | Freq: Every day | ORAL | 2 refills | Status: DC
Start: 2022-08-06 — End: 2023-11-26

## 2022-08-06 MED ORDER — OZEMPIC (0.25 OR 0.5 MG/DOSE) 2 MG/3ML SC SOPN
2 | SUBCUTANEOUS | 0 refills | 30.00000 days | Status: DC
Start: 2022-08-06 — End: 2022-08-27

## 2022-08-06 NOTE — Telephone Encounter (Signed)
LOV: 05/22/2022  FOV: 08/27/2022

## 2022-08-09 DIAGNOSIS — Z79899 Other long term (current) drug therapy: Secondary | ICD-10-CM | POA: Diagnosis not present

## 2022-08-09 DIAGNOSIS — H43811 Vitreous degeneration, right eye: Secondary | ICD-10-CM | POA: Diagnosis not present

## 2022-08-15 NOTE — Addendum Note (Signed)
Addended by: Orene Desanctis on: 08/15/2022 09:39 AM     Modules accepted: Level of Service

## 2022-08-27 ENCOUNTER — Encounter

## 2022-08-27 ENCOUNTER — Ambulatory Visit: Admit: 2022-08-27 | Discharge: 2022-08-27 | Payer: PRIVATE HEALTH INSURANCE | Attending: Oncology | Primary: Oncology

## 2022-08-27 DIAGNOSIS — E119 Type 2 diabetes mellitus without complications: Secondary | ICD-10-CM

## 2022-08-27 LAB — POCT GLYCOSYLATED HEMOGLOBIN (HGB A1C): Hemoglobin A1C: 8.6 %

## 2022-08-27 MED ORDER — INSULIN DETEMIR 100 UNIT/ML SC SOPN
100 | SUBCUTANEOUS | 3 refills | 38.50000 days | Status: DC
Start: 2022-08-27 — End: 2023-12-02

## 2022-08-27 MED ORDER — INSULIN DETEMIR 100 UNIT/ML SC SOPN
100 | SUBCUTANEOUS | 3 refills | 38.50000 days | Status: DC
Start: 2022-08-27 — End: 2022-08-27

## 2022-08-27 MED ORDER — OZEMPIC (1 MG/DOSE) 4 MG/3ML SC SOPN
43 MG/3ML | SUBCUTANEOUS | 1 refills | Status: DC
Start: 2022-08-27 — End: 2023-05-27

## 2022-08-28 DIAGNOSIS — M3322 Polymyositis with myopathy: Secondary | ICD-10-CM | POA: Diagnosis not present

## 2022-08-28 LAB — CBC WITH AUTO DIFFERENTIAL
Basophils %: 0.6 %
Basophils Absolute: 0.1 10*3/uL (ref 0.0–0.2)
Eosinophils %: 3 %
Eosinophils Absolute: 0.3 10*3/uL (ref 0.0–0.6)
Hematocrit: 40.3 % (ref 36.0–48.0)
Hemoglobin: 13.5 g/dL (ref 12.0–16.0)
Lymphocytes %: 33.1 %
Lymphocytes Absolute: 3.6 10*3/uL (ref 1.0–5.1)
MCH: 29.2 pg (ref 26.0–34.0)
MCHC: 33.5 g/dL (ref 31.0–36.0)
MCV: 87.1 fL (ref 80.0–100.0)
MPV: 10 fL (ref 5.0–10.5)
Monocytes %: 3.9 %
Monocytes Absolute: 0.4 10*3/uL (ref 0.0–1.3)
Neutrophils %: 59.4 %
Neutrophils Absolute: 6.4 10*3/uL (ref 1.7–7.7)
Platelets: 249 10*3/uL (ref 135–450)
RBC: 4.63 M/uL (ref 4.00–5.20)
RDW: 15.7 % — ABNORMAL HIGH (ref 12.4–15.4)
WBC: 10.7 10*3/uL (ref 4.0–11.0)

## 2022-08-28 LAB — LIPID PANEL
Cholesterol, Total: 157 mg/dL (ref 0–199)
HDL: 33 mg/dL — ABNORMAL LOW (ref 40–60)
Triglycerides: 333 mg/dL — ABNORMAL HIGH (ref 0–150)

## 2022-08-28 LAB — COMPREHENSIVE METABOLIC PANEL
ALT: 18 U/L (ref 10–40)
AST: 20 U/L (ref 15–37)
Albumin/Globulin Ratio: 1.3 (ref 1.1–2.2)
Albumin: 4.3 g/dL (ref 3.4–5.0)
Alkaline Phosphatase: 159 U/L — ABNORMAL HIGH (ref 40–129)
Anion Gap: 16 (ref 3–16)
BUN: 12 mg/dL (ref 7–20)
CO2: 22 mmol/L (ref 21–32)
Calcium: 9.5 mg/dL (ref 8.3–10.6)
Chloride: 101 mmol/L (ref 99–110)
Creatinine: 0.7 mg/dL (ref 0.6–1.1)
Est, Glom Filt Rate: >90 (ref >60)
Glucose: 224 mg/dL — ABNORMAL HIGH (ref 70–99)
Potassium: 4 mmol/L (ref 3.5–5.1)
Sodium: 139 mmol/L (ref 136–145)
Total Bilirubin: 0.5 mg/dL (ref 0.0–1.0)
Total Protein: 7.5 g/dL (ref 6.4–8.2)

## 2022-08-28 LAB — TSH REFLEX TO FT4: TSH: 1.92 u[IU]/mL (ref 0.27–4.20)

## 2022-08-28 LAB — ALBUMIN/CREATININE RATIO, URINE
Albumin Urine: <1.2 mg/dL (ref <2.0)
Creatinine, Ur: 45 mg/dL (ref 28.0–259.0)

## 2022-08-28 LAB — LDL CHOLESTEROL, DIRECT: LDL Direct: 84 mg/dL (ref <100)

## 2022-08-28 NOTE — Assessment & Plan Note (Signed)
Following with [facility]  Due to have surgery next week

## 2022-08-28 NOTE — Progress Notes (Addendum)
Shelly Richard (DOB:  07-17-1985) is a 37 y.o. female,Established patient, here for evaluation of the following chief complaint(s):  Diabetes      ASSESSMENT/PLAN:  1. Type 2 diabetes mellitus without complication, with long-term current use of insulin (HCC)  Assessment & Plan:  Not well-controlled  A1c 8.6  Encouraged compliance and consistency with insulin regimen and monitoring blood sugar  Discussed diet  Will increase Ozempic to 1 mg  Continue Levemir 40 units  Continue Humalog sliding scale  Continue metformin  We will check labs including CMP and UACR  Orders:  -     POCT glycosylated hemoglobin (Hb A1C)  -     CBC with Auto Differential; Future  -     Comprehensive Metabolic Panel; Future  -     Lipid Panel; Future  -     MICROALBUMIN / CREATININE URINE RATIO; Future  -     HM DIABETES FOOT EXAM  2. Acquired hypothyroidism  Assessment & Plan:  Stable, controlled  Continue Synthroid  Check TSH  Orders:  -     TSH with Reflex; Future  3. Vitamin D deficiency  -     Vitamin D 1,25 Dihydroxy; Future  4. Type 2 diabetes mellitus with hyperglycemia, with long-term current use of insulin (HCC)  Assessment & Plan:  Not well-controlled  A1c 8.6  Encouraged compliance and consistency with insulin regimen and monitoring blood sugar  Discussed diet  Will increase Ozempic to 1 mg  Continue Levemir 40 units  Continue Humalog sliding scale  Continue metformin  We will check labs including CMP and UACR  5. Strabismus  Assessment & Plan:  Following with [facility]  Due to have surgery next week  6. Mixed hyperlipidemia  Assessment & Plan:  Stable, controlled   Check lipids   Continue simvastatin   7. Essential hypertension  Assessment & Plan:  Stable, controlled   Continue losartan   8. Preop examination  Assessment & Plan:  Perioperative risk related to the patient's upcoming surgery is considered low. she is cleared for surgery.  Pre-op exam was completed on 08/29/22 2:55 PM.   Check labs       No follow-ups on  file.    SUBJECTIVE/OBJECTIVE:  Diabetes  Pertinent negatives for hypoglycemia include no confusion, dizziness, headaches, nervousness/anxiousness or pallor. Pertinent negatives for diabetes include no chest pain, no fatigue and no weakness.     Here for follow-up. Overall she is feeling well.  She has been tracking her blood sugars on occasion.  Blood sugars ranging from low 100s to 200s.  She does admit that her diet has not been great.  She did have [employer] this morning.  She is also not taking her insulin consistently.  States that most days she remembers to take her Levemir but does not always take her Humalog.  She is doing well on decreased dose of Ozempic.  She would like to try to increase this again to 1 mg to help with weight loss.  She is having eye surgery next week.    [name] presents to the office today for a preoperative consultation. She is having LUL ptosis repair on 09/10/2022 by Dr. Claiborne Rigg. Planned anesthesia is Local and IV sedation.  The patient has the following known anesthesia issues: none  Patient has a bleeding risk of : no recent abnormal bleeding, no remote history of abnormal bleeding.  Patient's medications, allergies, past medical, surgical,social and family histories were reviewed and updated as appropriate.  Current Outpatient Medications   Medication Sig Dispense Refill    Semaglutide, 1 MG/DOSE, (OZEMPIC, 1 MG/DOSE,) 4 MG/3ML SOPN sc injection Inject 1 mg into the skin every 7 days 9 mL 1    insulin detemir (LEVEMIR FLEXPEN) 100 UNIT/ML injection pen INJECT 40 UNITS UNDER THE SKIN ONCE NIGHTLY 6 mL 3    ferrous sulfate (FEROSUL) 325 (65 Fe) MG tablet Take 1 tablet by mouth daily (with breakfast) 30 tablet 2    metFORMIN (GLUCOPHAGE) 500 MG tablet Take 2 tablets by mouth 2 times daily (with meals) 180 tablet 1    Insulin Pen Needle (DROPLET PEN NEEDLES) 32G X 6 MM MISC USE ONCE DAILY 100 each 3    Continuous Blood Gluc Sensor (FREESTYLE LIBRE 2 SENSOR) MISC Apply every 2  weeks 12 each 1    dapagliflozin (FARXIGA) 10 MG tablet Take 1 tablet by mouth every morning 90 tablet 1    simvastatin (ZOCOR) 40 MG tablet Take 1 tablet by mouth daily 90 tablet 1    cetirizine (ZYRTEC) 10 MG tablet Take 1 tablet by mouth daily 90 tablet 1    levothyroxine (SYNTHROID) 75 MCG tablet TAKE ONE TABLET BY MOUTH DAILY 90 tablet 1    montelukast (SINGULAIR) 10 MG tablet Take 1 tablet by mouth daily 90 tablet 1    Continuous Blood Gluc Sensor (FREESTYLE LIBRE 3 SENSOR) MISC Check glucose per diagnosis E11.9 6 each 3    furosemide (LASIX) 20 MG tablet Take 1 tablet by mouth daily as needed (swelling) 60 tablet 3    insulin lispro, 1 Unit Dial, (HUMALOG/ADMELOG) 100 UNIT/ML SOPN INJECT 10 UNITS UNDER THE SKIN WITH MEALS PLUS INJECT 2 UNITS FOR BLOOD SUGAR  150-200, 4 UNITS IF 201-250, 6 UNITS IF 251-300, 8 UNITS IF 301-350, 10 UNITS IF GREATER THAN 350 12 mL 3    Cholecalciferol (VITAMIN D3) 50 MCG (2000 UT) TABS TAKE ONE TABLET BY MOUTH DAILY 30 tablet 3    Continuous Blood Gluc Sensor (FREESTYLE LIBRE 2 SENSOR) MISC USE TO TEST BLOOD GLUCOSE AS DIRECTED 14 each 2    letrozole (FEMARA) 2.5 MG tablet       losartan (COZAAR) 25 MG tablet TAKE ONE TABLET BY MOUTH DAILY 90 tablet 1    Insulin Pen Needle 32G X 6 MM MISC Use four times daily and as needed 100 each 5    EPINEPHrine (EPIPEN) 0.3 MG/0.3ML SOAJ injection       blood glucose monitor strips Tes2t qam  times a day & as needed for symptoms of irregular blood glucose. Please provide test strips approved by insurance. 100 strip 2    albuterol sulfate HFA 108 (90 Base) MCG/ACT inhaler Inhale 2 puffs into the lungs every 6 hours as needed for Wheezing or Shortness of Breath 1 Inhaler 5    Insulin Pen Needle ([employer] PEN NEEDLES 31G) 31G X 8 MM MISC 1 each by Does not apply route daily 100 each 3    Insulin Pen Needle ([employer] PEN NEEDLES 29G) 29G X MISC 1 each by Does not apply route daily 100 each 3    valACYclovir (VALTREX) 500 MG tablet TAKE 1 TABLET  BY MOUTH DAILY 90 tablet 5    Lancets MISC 1 each by Does not apply route daily Please provide lancets approved by insurance. 100 each 3    Blood Glucose Monitoring Suppl (ACURA BLOOD GLUCOSE METER) w/Device KIT Please provide meter approved by insurance.  Check bs qam 1 kit 0  ibuprofen (ADVIL;MOTRIN) 600 MG tablet Take 1 tablet by mouth every 6 hours as needed for Pain 120 tablet 0    acetaminophen (TYLENOL) 500 MG tablet Take 1 tablet by mouth 4 times daily as needed for Pain 120 tablet 0    Continuous Blood Gluc Sensor (FREESTYLE LIBRE 3 SENSOR) MISC Check glucose per diagnosis E11.9 (Patient not taking: Reported on 01/22/2022) 6 each 3    Continuous Blood Gluc Sensor (FREESTYLE LIBRE 3 SENSOR) MISC Check glucose per diagnosis E11.9 (Patient not taking: Reported on 01/22/2022) 6 each 3     No current facility-administered medications for this visit.       Review of Systems   Constitutional:  Negative for chills, fatigue and fever.   HENT:  Negative for congestion, sinus pressure and sinus pain.    Respiratory:  Negative for cough, shortness of breath and wheezing.    Cardiovascular:  Negative for chest pain and palpitations.   Gastrointestinal:  Negative for abdominal pain, constipation and diarrhea.   Musculoskeletal:  Negative for arthralgias, back pain and myalgias.   Skin:  Negative for color change, pallor and rash.   Neurological:  Negative for dizziness, syncope, weakness, light-headedness and headaches.   Psychiatric/Behavioral:  Negative for behavioral problems, confusion and sleep disturbance. The patient is not nervous/anxious.        Vitals:    08/27/22 1558   BP: 120/76   Site: Left Upper Arm   Position: Sitting   Cuff Size: Medium Adult   Pulse: 98   Temp: 98.6 F (37 C)   SpO2: 98%   Weight: 93.9 kg (207 lb)   Height: 1.626 m (5\' 4" )       Physical Exam  Constitutional:       Appearance: Normal appearance. She is well-developed.   HENT:      Head: Normocephalic and atraumatic.   Eyes:       Conjunctiva/sclera: Conjunctivae normal.      Pupils: Pupils are equal, round, and reactive to light.   Neck:      Thyroid: No thyromegaly.      Vascular: No JVD.   Cardiovascular:      Rate and Rhythm: Normal rate and regular rhythm.      Heart sounds: Normal heart sounds.   Pulmonary:      Effort: Pulmonary effort is normal. No respiratory distress.      Breath sounds: Normal breath sounds. No wheezing.   Musculoskeletal:         General: Normal range of motion.      Cervical back: Normal range of motion and neck supple.   Skin:     General: Skin is warm and dry.      Capillary Refill: Capillary refill takes less than 2 seconds.   Neurological:      Mental Status: She is alert and oriented to person, place, and time.   Psychiatric:         Behavior: Behavior normal.                 An electronic signature was used to authenticate this note.    --Farley Ly, [name]

## 2022-08-28 NOTE — Assessment & Plan Note (Signed)
Stable, controlled  Continue losartan

## 2022-08-28 NOTE — Assessment & Plan Note (Signed)
Stable, controlled  Continue Synthroid  Check TSH

## 2022-08-28 NOTE — Assessment & Plan Note (Signed)
Stable, controlled   Check lipids   Continue simvastatin

## 2022-08-28 NOTE — Assessment & Plan Note (Signed)
Not well-controlled  A1c 8.6  Encouraged compliance and consistency with insulin regimen and monitoring blood sugar  Discussed diet  Will increase Ozempic to 1 mg  Continue Levemir 40 units  Continue Humalog sliding scale  Continue metformin  We will check labs including CMP and UACR

## 2022-08-29 DIAGNOSIS — M3322 Polymyositis with myopathy: Secondary | ICD-10-CM | POA: Diagnosis not present

## 2022-08-29 LAB — VITAMIN D 1,25 DIHYDROXY: Vit D, 1,25-Dihydroxy: 69.1 pg/mL (ref 19.9–79.3)

## 2022-08-29 NOTE — Telephone Encounter (Signed)
Marvis Moeller, Nunn 08/29/2022 1:02 PM EDT      ----- Message -----  From: Rush Barer  Sent: 08/29/2022 12:52 PM EDT  To: [facility] Staff  Subject: Labs     Good Afternoon,    Are you able to fill out this paper work and fax to them? It should be 2 separate papers attached

## 2022-08-29 NOTE — Telephone Encounter (Signed)
Submitted PA for Ozempic (1 MG/DOSE) 4MG /3ML pen-injectors  Via CMM Key: [id number]  STATUS: PENDING.    Follow up done daily; if no decision with in three days we will refax.  If another three days goes by with no decision will call the insurance for status.

## 2022-08-29 NOTE — Assessment & Plan Note (Addendum)
Perioperative risk related to the patient's upcoming surgery is considered low. she is cleared for surgery.  Pre-op exam was completed on 08/27/2022 2:55 PM.   Check labs

## 2022-08-30 DIAGNOSIS — R7989 Other specified abnormal findings of blood chemistry: Secondary | ICD-10-CM | POA: Diagnosis not present

## 2022-08-30 NOTE — Telephone Encounter (Signed)
MyChart message sent relaying information.

## 2022-08-30 NOTE — Telephone Encounter (Signed)
APPROVAL for Ozempic (1 MG/DOSE) 4MG /3ML pen-injectors for 268 days. The PA# assigned is [id number]. Authorization Expiration Date: May 23, 2023.     If this requires a response please respond to the pool ( [facility] MEDICATION PRE-AUTH).      Thank you please advise patient.

## 2022-09-03 MED ORDER — HYDROCORTISONE 1 % EX CREA
1 | CUTANEOUS | 1 refills | Status: AC
Start: 2022-09-03 — End: 2022-09-10

## 2022-10-24 DIAGNOSIS — I7300 Raynaud's syndrome without gangrene: Secondary | ICD-10-CM | POA: Diagnosis not present

## 2022-10-24 DIAGNOSIS — I781 Nevus, non-neoplastic: Secondary | ICD-10-CM | POA: Diagnosis not present

## 2022-10-24 DIAGNOSIS — M351 Other overlap syndromes: Secondary | ICD-10-CM | POA: Diagnosis not present

## 2022-11-04 DIAGNOSIS — L942 Calcinosis cutis: Secondary | ICD-10-CM | POA: Diagnosis not present

## 2022-11-04 DIAGNOSIS — I7300 Raynaud's syndrome without gangrene: Secondary | ICD-10-CM | POA: Diagnosis not present

## 2022-11-04 DIAGNOSIS — M351 Other overlap syndromes: Secondary | ICD-10-CM | POA: Diagnosis not present

## 2022-11-06 DIAGNOSIS — M3322 Polymyositis with myopathy: Secondary | ICD-10-CM | POA: Diagnosis not present

## 2022-11-07 DIAGNOSIS — M3322 Polymyositis with myopathy: Secondary | ICD-10-CM | POA: Diagnosis not present

## 2022-11-27 ENCOUNTER — Encounter: Payer: PRIVATE HEALTH INSURANCE | Attending: Oncology | Primary: Oncology

## 2022-11-27 DIAGNOSIS — M351 Other overlap syndromes: Secondary | ICD-10-CM | POA: Diagnosis not present

## 2022-12-19 DIAGNOSIS — M351 Other overlap syndromes: Secondary | ICD-10-CM | POA: Diagnosis not present

## 2023-01-27 DIAGNOSIS — M3322 Polymyositis with myopathy: Secondary | ICD-10-CM | POA: Diagnosis not present

## 2023-01-28 DIAGNOSIS — M3322 Polymyositis with myopathy: Secondary | ICD-10-CM | POA: Diagnosis not present

## 2023-02-13 DIAGNOSIS — I781 Nevus, non-neoplastic: Secondary | ICD-10-CM | POA: Diagnosis not present

## 2023-02-20 DIAGNOSIS — I7300 Raynaud's syndrome without gangrene: Secondary | ICD-10-CM | POA: Diagnosis not present

## 2023-02-20 DIAGNOSIS — M351 Other overlap syndromes: Secondary | ICD-10-CM | POA: Diagnosis not present

## 2023-02-20 DIAGNOSIS — I781 Nevus, non-neoplastic: Secondary | ICD-10-CM | POA: Diagnosis not present

## 2023-02-20 DIAGNOSIS — L942 Calcinosis cutis: Secondary | ICD-10-CM | POA: Diagnosis not present

## 2023-03-31 NOTE — Progress Notes (Signed)
 Formatting of this note is different from the original.  Images from the original note were not included.  Subjective   Subjective:  Shelly Richard is a 37 year old G2P2002 who presents today with   Chief Complaint   Patient presents with    Gyn Problem     C/o boil on left side of groin area, vaginal itching, and vaginal discharge.      OB History   Gravida Para Term Preterm AB Living   2 2 2     2    SAB IAB Ectopic Multiple Live Births           2     # Outcome Date GA Lbr Len/2nd Weight Sex Type Anes PTL Lv   2 Term         LIV   1 Term         LIV     Past Medical History:   Diagnosis Date    Asthma     Diabetes mellitus type 2, controlled, without complications (HCC)      No past surgical history on file.    Social History     Tobacco Use    Smoking status: Never    Smokeless tobacco: Never   Vaping Use    Vaping status: Never Used   Substance Use Topics    Alcohol use: No    Drug use: No     Social History     Tobacco Use   Smoking Status Never   Smokeless Tobacco Never     Counseling given: Not Answered    History reviewed. No pertinent family history.    Allergies   Allergen Reactions    Lisinopril Angioedema     Outpatient Medications Marked as Taking for the 03/31/23 encounter (Office Visit) with Ogburn, Casey A., MD   Medication Sig Dispense Refill    sulfamethoxazole-trimethoprim (BACTRIM DS,SEPTRA DS) 800-160 mg per tablet Take 1 tablet by mouth every 12 (twelve) hours for 10 days. Indications: Cellulitis (skin soft tissue, wound infections) 20 tablet 0    fluconazole  (DIFLUCAN ) 150 mg tablet Take 1 tablet by mouth every third day as needed for up to 2 doses. 2 tablet 1    ferrous sulfate  325 (65 FE) MG TABS Take 325 mg by mouth.      insulin  lispro (HUMALOG ) 100 Units/mL SOLN subcutaneous injection Inject  under the skin.      montelukast  (SINGULAIR ) 10 MG TABS Take 10 mg by mouth daily.      Prenatal Vit-Fe Fumarate-FA (PRENATAL VITAMINS) 28-0.8 MG TABS Take 1 tablet by mouth daily. 90 tablet 3     metFORMIN  (GLUCOPHAGE ) 500 MG TABS Take 500 mg by mouth 2 (two) times daily with meals.      losartan  (COZAAR ) 25 MG TABS Take 25 mg by mouth daily.      simvastatin  (ZOCOR ) 40 MG TABS Take 40 mg by mouth at bedtime.      levothyroxine  (SYNTHROID , LEVOTHROID) 50 MCG TABS Take 50 mcg by mouth daily.       Review of Systems   Constitutional: Negative.    HENT: Negative.     Eyes: Negative.    Respiratory: Negative.     Cardiovascular: Negative.    Gastrointestinal: Negative.    Genitourinary: Negative.    Musculoskeletal: Negative.    Skin: Negative.    Neurological: Negative.    Endo/Heme/Allergies: Negative.    Psychiatric/Behavioral: Negative.       Objective:  Vitals:    03/31/23 1459   BP: 132/88   Weight: 207 lb (93.9 kg)   Height: 64 (162.6 cm)           Physical Exam  Constitutional:       Appearance: Normal appearance. She is normal weight.   Genitourinary:       Comments: Open area with surrounding cellulitis. Some drainage noted.  Neurological:      General: No focal deficit present.      Mental Status: She is alert and oriented to person, place, and time.   Psychiatric:         Mood and Affect: Mood normal.         Behavior: Behavior normal.         Judgment: Judgment normal.     Assessment & Plan  Vulvar cellulitis  Cellulitis noted with some drainage, but no discreet abscess.       Vaginal discharge        Counseled on sitz baths and cleaning the open area with H2O2. Will start bactrim and plan to follow-up in 1 week.    Medications ordered this encounter   Medications    sulfamethoxazole-trimethoprim (BACTRIM DS,SEPTRA DS) 800-160 mg per tablet     Sig: Take 1 tablet by mouth every 12 (twelve) hours for 10 days. Indications: Cellulitis (skin soft tissue, wound infections)     Dispense:  20 tablet     Refill:  0    fluconazole  (DIFLUCAN ) 150 mg tablet     Sig: Take 1 tablet by mouth every third day as needed for up to 2 doses.     Dispense:  2 tablet     Refill:  1     Augustin RONAL Spina, MD      Electronically signed by Spina Augustin LABOR., MD at 03/31/2023  3:28 PM EST

## 2023-04-01 DIAGNOSIS — M3322 Polymyositis with myopathy: Secondary | ICD-10-CM | POA: Diagnosis not present

## 2023-04-02 DIAGNOSIS — M3322 Polymyositis with myopathy: Secondary | ICD-10-CM | POA: Diagnosis not present

## 2023-04-14 DIAGNOSIS — I781 Nevus, non-neoplastic: Secondary | ICD-10-CM | POA: Diagnosis not present

## 2023-04-28 NOTE — Telephone Encounter (Signed)
Patient requesting alternate medication; Levemir is out of stock and they won't be reordering. Patient was told she may need appointment first lov 08/2022. Thanks

## 2023-04-29 MED ORDER — LANTUS SOLOSTAR 100 UNIT/ML SC SOPN
100 | Freq: Every evening | SUBCUTANEOUS | 0 refills | 40.00000 days | Status: DC
Start: 2023-04-29 — End: 2024-04-07

## 2023-04-29 NOTE — Telephone Encounter (Signed)
Sent prescription for Lantus instead.

## 2023-04-29 NOTE — Telephone Encounter (Signed)
Spoke to pt and relayed information regarding new insulin. Pt confirmed understanding with no additional questions or concerns.

## 2023-05-14 NOTE — Telephone Encounter (Signed)
Submitted PA for Ozempic (1 MG/DOSE) 4MG /3ML pen-injectors  Via CMM Key: [id number] STATUS: PENDING.    Follow up done daily; if no decision with in three days we will refax.  If another three days goes by with no decision will call the insurance for status.

## 2023-05-16 DIAGNOSIS — M1990 Unspecified osteoarthritis, unspecified site: Secondary | ICD-10-CM | POA: Diagnosis not present

## 2023-05-16 DIAGNOSIS — Z79899 Other long term (current) drug therapy: Secondary | ICD-10-CM | POA: Diagnosis not present

## 2023-05-16 DIAGNOSIS — D509 Iron deficiency anemia, unspecified: Secondary | ICD-10-CM | POA: Diagnosis not present

## 2023-05-16 DIAGNOSIS — Z79624 Long term (current) use of inhibitors of nucleotide synthesis: Secondary | ICD-10-CM | POA: Diagnosis not present

## 2023-05-16 DIAGNOSIS — Z79631 Long term (current) use of antimetabolite agent: Secondary | ICD-10-CM | POA: Diagnosis not present

## 2023-05-16 DIAGNOSIS — D649 Anemia, unspecified: Secondary | ICD-10-CM | POA: Diagnosis not present

## 2023-05-16 DIAGNOSIS — I7300 Raynaud's syndrome without gangrene: Secondary | ICD-10-CM | POA: Diagnosis not present

## 2023-05-16 DIAGNOSIS — K219 Gastro-esophageal reflux disease without esophagitis: Secondary | ICD-10-CM | POA: Diagnosis not present

## 2023-05-16 DIAGNOSIS — M329 Systemic lupus erythematosus, unspecified: Secondary | ICD-10-CM | POA: Diagnosis not present

## 2023-05-16 DIAGNOSIS — J849 Interstitial pulmonary disease, unspecified: Secondary | ICD-10-CM | POA: Diagnosis not present

## 2023-05-16 DIAGNOSIS — G7249 Other inflammatory and immune myopathies, not elsewhere classified: Secondary | ICD-10-CM | POA: Diagnosis not present

## 2023-05-16 DIAGNOSIS — I272 Pulmonary hypertension, unspecified: Secondary | ICD-10-CM | POA: Diagnosis not present

## 2023-05-16 DIAGNOSIS — Z7952 Long term (current) use of systemic steroids: Secondary | ICD-10-CM | POA: Diagnosis not present

## 2023-05-16 DIAGNOSIS — M351 Other overlap syndromes: Secondary | ICD-10-CM | POA: Diagnosis not present

## 2023-05-16 DIAGNOSIS — Z23 Encounter for immunization: Secondary | ICD-10-CM | POA: Diagnosis not present

## 2023-05-26 NOTE — Telephone Encounter (Signed)
LOV: 08/27/2022  FOV: N/A

## 2023-05-27 DIAGNOSIS — M351 Other overlap syndromes: Secondary | ICD-10-CM | POA: Diagnosis not present

## 2023-05-27 DIAGNOSIS — Z79899 Other long term (current) drug therapy: Secondary | ICD-10-CM | POA: Diagnosis not present

## 2023-05-27 MED ORDER — OZEMPIC (1 MG/DOSE) 4 MG/3ML SC SOPN
4 | SUBCUTANEOUS | 2 refills | 30.00000 days | Status: DC
Start: 2023-05-27 — End: 2023-12-02

## 2023-06-02 NOTE — Progress Notes (Signed)
 Formatting of this note is different from the original.  GYN Problem    CC:  F/U (Pt states her body has bumps all over )      HPI: Patient is a 38 year old year old G2P2002 who complains of subcutaneous cyst.  Patient treated for a superficial cellulitis on the left subcostal ankle.  That lesion has resolved.  She does notice intermittent subcutaneous cyst or inflamed glands that will rub periodically.  The appearance does not look like hidradenitis.  She says this will happen a few times a year.  Patient is diabetic with well-controlled blood sugars.  Currently patient does not have any lesions or erythema.  Patient without fever or chills.        ROS:   General: denies fever / chills  HEENT: denies sore throat:  CV: denies chest pain:  Repiratory: denies shortness of breath  GI: denies abdominal pain  GU: denies dysuria:    PFSH:  Past Medical History:   Diagnosis Date    Abnormal uterine bleeding (AUB)     Asthma     Diabetes mellitus type 2, controlled, without complications (HCC)     Hx of dilation and curettage     Hypothyroidism     Infertility, female     Iron deficiency anemia     Menorrhagia with regular cycle     Trichomonas infection     Uterine leiomyoma        Past Surgical History:   Procedure Laterality Date    BELPHAROPTOSIS REPAIR Left     HC LAPAROSCOPIC MYOMECTOMY      HYSTEROSCOPY W/ POLYPECTOMY         History   Sexual Activity    Sexual activity: Yes    Partners: Male      ALLERGIES / REACTIONS:  Allergies   Allergen Reactions    Lisinopril Angioedema    Semaglutide  Hives    Peanut Oil Swelling     THROAT ITCHES    THROAT ITCHES   Walnuts       SOCIAL HISTORY:   reports that she has never smoked. She has never used smokeless tobacco. She reports that she does not drink alcohol and does not use drugs.    PHYSICAL EXAMINATION:  Vital Signs:   Vitals:    06/02/23 1300   BP: 124/76   Weight: 202 lb (91.6 kg)   Height: 64 (162.6 cm)     Body mass index is 34.67 kg/m.    Physical  Exam  Cardiovascular:      Rate and Rhythm: Normal rate.   Pulmonary:      Effort: Pulmonary effort is normal.   Abdominal:      General: Abdomen is flat.   Neurological:      Mental Status: She is alert.         ASSESSMENT AND PLAN:  Maria Stevenson was seen today for f/u.    Diagnoses and all orders for this visit:    Skin lesion  Comments:  Superficial cellulitis has resolved.  Does have recurrence of superficial infection with subsequent cyst.  GIven referal to derm, will use doxy as needed    Other orders  -     doxycycline 100 MG CAPS; Take 1 capsule by mouth every 12 (twelve) hours for 14 days.    I have spent a total of 30 minutes on Preparing to see the patient (e.g., review of tests), Performing a medical appropriate examination and/or evaluation, Counseling and educating  the patient/family/caregiver, Ordering medications, tests, or procedures, and Documenting clinical information in the electronic or other health record.    Chauncey Ozell Hammonds, MD       Electronically signed by Hammonds Chauncey Ozell, MD at 06/02/2023  2:55 PM EST

## 2023-06-02 NOTE — Progress Notes (Signed)
 Formatting of this note is different from the original.  GYN Problem    CC:  F/U (Pt states her body has bumps all over )      HPI: Patient is a 38 year old year old G2P2002 who complains of subcutaneous cyst.  Patient treated for a superficial cellulitis on the left subcostal ankle.  That lesion has resolved.  She does notice intermittent subcutaneous cyst or inflamed glands that will rub periodically.  The appearance does not look like hidradenitis.  She says this will happen a few times a year.  Patient is diabetic with well-controlled blood sugars.  Currently patient does not have any lesions or erythema.  Patient without fever or chills.        ROS:   General: denies fever / chills  HEENT: denies sore throat:  CV: denies chest pain:  Repiratory: denies shortness of breath  GI: denies abdominal pain  GU: denies dysuria:    PFSH:  Past Medical History:   Diagnosis Date    Abnormal uterine bleeding (AUB)     Asthma     Diabetes mellitus type 2, controlled, without complications (HCC)     Hx of dilation and curettage     Hypothyroidism     Infertility, female     Iron deficiency anemia     Menorrhagia with regular cycle     Trichomonas infection     Uterine leiomyoma        Past Surgical History:   Procedure Laterality Date    BELPHAROPTOSIS REPAIR Left     HC LAPAROSCOPIC MYOMECTOMY      HYSTEROSCOPY W/ POLYPECTOMY         History   Sexual Activity    Sexual activity: Yes    Partners: Male      ALLERGIES / REACTIONS:  Allergies   Allergen Reactions    Lisinopril Angioedema    Semaglutide  Hives    Peanut Oil Swelling     THROAT ITCHES    THROAT ITCHES   Walnuts       SOCIAL HISTORY:   reports that she has never smoked. She has never used smokeless tobacco. She reports that she does not drink alcohol and does not use drugs.    PHYSICAL EXAMINATION:  Vital Signs:   Vitals:    06/02/23 1300   BP: 124/76   Weight: 202 lb (91.6 kg)   Height: 64 (162.6 cm)     Body mass index is 34.67 kg/m.    Physical  Exam  Cardiovascular:      Rate and Rhythm: Normal rate.   Pulmonary:      Effort: Pulmonary effort is normal.   Abdominal:      General: Abdomen is flat.   Neurological:      Mental Status: She is alert.         ASSESSMENT AND PLAN:  Shelly Richard was seen today for f/u.    Diagnoses and all orders for this visit:    Skin lesion  Comments:  Superficial cellulitis has resolved.  Does have recurrence of superficial infection with subsequent cyst.  GIven referal to derm, will use doxy as needed    Other orders  -     doxycycline 100 MG CAPS; Take 1 capsule by mouth every 12 (twelve) hours for 14 days.    I have spent a total of 30 minutes on Preparing to see the patient (e.g., review of tests), Performing a medical appropriate examination and/or evaluation, Counseling and educating  the patient/family/caregiver, Ordering medications, tests, or procedures, and Documenting clinical information in the electronic or other health record.    Shelly Ozell Hammonds, MD       Electronically signed by Richard Shelly Ozell, MD at 06/02/2023  2:55 PM EST

## 2023-06-16 DIAGNOSIS — I781 Nevus, non-neoplastic: Secondary | ICD-10-CM | POA: Diagnosis not present

## 2023-06-17 ENCOUNTER — Ambulatory Visit: Admit: 2023-06-17 | Payer: PRIVATE HEALTH INSURANCE | Primary: Oncology

## 2023-06-17 ENCOUNTER — Ambulatory Visit: Admit: 2023-06-17 | Discharge: 2023-06-21 | Payer: PRIVATE HEALTH INSURANCE | Primary: Oncology

## 2023-06-17 DIAGNOSIS — L0291 Cutaneous abscess, unspecified: Secondary | ICD-10-CM

## 2023-06-17 LAB — ROUTINE CULTURE PLUS STAIN

## 2023-06-17 MED ORDER — mupirocin (BACTROBAN) 2 % ointment
2 | Freq: Two times a day (BID) | TOPICAL | 1 refills | 7.00000 days | Status: AC
Start: 2023-06-17 — End: ?

## 2023-06-17 MED ORDER — doxycycline monohydrate (MONODOX) 100 MG capsule
100 | ORAL_CAPSULE | Freq: Two times a day (BID) | ORAL | 0 refills | 10.00000 days
Start: 2023-06-17 — End: 2023-08-26

## 2023-06-17 MED ORDER — chlorhexidine (HIBICLENS) 4 % external liquid
4 | TOPICAL | 11 refills | Status: AC
Start: 2023-06-17 — End: ?

## 2023-06-17 NOTE — Progress Notes (Signed)
 Maria Stevenson is a 38 y.o. Black or [place] American female established patient. Last visit March 2023    CC:   Chief Complaint   Patient presents with    Hair/Scalp Problem     PT HERE FOR BUMP TO SCALP     HPI:    1.) Few year history of painful bumps on body, and most recently one on left scalp. Thinks change of shampoo may be responsible. Also has history of hidradenitis suppurativa?, prior treatment: Excision, doxycycline, benzoyl peroxide 5% wash, clindamycin 1% gel and 4% chlorhexidine gluconate wash. Recurrent condition 1-3 times per month in different location. Doesn't work on health care.    Other derm history  - Hibiclens, and clindamycin 1% gel    ADDITIONAL HISTORY:  I have reviewed past medical, social and surgical history, medications and allergies as documented in the patient's electronic medical record.    PHYSICAL EXAM:  The patient is alert, awake and oriented x 3, well-nourished, good humored and in no apparent distress.  Examination was performed of the following: scalp/hair and right lower extremity  1.) Brown nodule with peripheral collarette on right thigh and follicular nodule, with mild drainage on left scalp.        ASSESSMENT AND PLAN:  1.) Recurrent furunculosis-like lesions, could be spectrum of hidradenitis suppurativa or furunculosis  - Advised glycemic control  -     Routine Culture plus Stain  -     doxycycline monohydrate (MONODOX) 100 MG capsule; Take 1 capsule (100 mg total) by mouth 2 times a day with meals x 4 weeks. Take with non-dairy meals, and full glass of water. Stay upright for 1 hour after taking it. May cause severe sunburn. Stop and call us if you experience severe headaches. Contraindicated in pregnancy  -     mupirocin (BACTROBAN) 2 % ointment; Apply topically 2 times a day. Twice daily x 7 days every month x 3. Nose, belly button  -     chlorhexidine (HIBICLENS) 4 % external liquid; Apply topically once a week. As body wash from neck down    The patient  indicates understanding of these issues and agrees with the plan.    Follow-up in 2 months  Waynette Buttery, MD M.D.  06/17/2023

## 2023-07-10 DIAGNOSIS — M3322 Polymyositis with myopathy: Secondary | ICD-10-CM | POA: Diagnosis not present

## 2023-07-15 DIAGNOSIS — M3322 Polymyositis with myopathy: Secondary | ICD-10-CM | POA: Diagnosis not present

## 2023-08-26 ENCOUNTER — Ambulatory Visit: Admit: 2023-08-26 | Discharge: 2023-08-30 | Payer: Medicaid (Managed Care) | Primary: Oncology

## 2023-08-26 DIAGNOSIS — L0291 Cutaneous abscess, unspecified: Secondary | ICD-10-CM

## 2023-08-26 MED ORDER — doxycycline monohydrate (MONODOX) 100 MG capsule
100 | ORAL_CAPSULE | Freq: Two times a day (BID) | ORAL | 0 refills | 10.00000 days | Status: AC
Start: 2023-08-26 — End: ?

## 2023-08-26 NOTE — Progress Notes (Signed)
 Shelly Richard is a 38 y.o. Black or [place] American female established patient. Last visit March 2025    CC:   Chief Complaint   Patient presents with    Skin Problem     Pt here for follow up     HPI:    1.) Follow up for MSSA abscess on scalp and thigh. Also has history of hidradenitis suppurativa which was previously treated with  Excision, doxycycline , benzoyl peroxide 5% wash, clindamycin 1% gel and 4% chlorhexidine  gluconate wash, but currently not active. Scalp lesion patient thought was after change on hair care. Currently using [employer] essential, didn't like Royal oils.  Reports abscess were recurrent 1-3 per month. At prior visit culture of lesion on scalp: MSSA sensitive to doxycycline . Completed 4 weeks of doxycycline  100 mg twice daily, as well as 4% chlorhexidine  gluconate wash weekly and mupirocin  2% ointment twice daily nares, didn't do belly button. Doesn't work on health care, works in childcare facility. Lesion on scalp has resolved but with alopecic patch.  2.) Wants recommendations for dry facial skin    Other derm history  - Hibiclens , and clindamycin 1% gel    ADDITIONAL HISTORY:  I have reviewed past medical, social and surgical history, medications and allergies as documented in the patient's electronic medical record.    PHYSICAL EXAM:  The patient is alert, awake and oriented x 3, well-nourished, good humored and in no apparent distress.  Examination was performed of the following: scalp/hair and head/face  1.) Round patch of short hairs on left scalp  2.) Facial xerosis    ASSESSMENT AND PLAN:  1.) Recurrent furunculosis, post 4 weeks of doxycycline , currently in decolonization  - Recommend adding mupirocin  2% treatment in umbilicus.   -  If repeated: doxycycline  monohydrate (MONODOX ) 100 MG capsule; Take 1 capsule (100 mg total) by mouth 2 times a day with meals x 4 weeks. Take with non-dairy meals, and full glass of water . Stay upright for 1 hour after taking it. May cause severe  sunburn. Stop and call us  if you experience severe headaches. Contraindicated in pregnancy  For 3 more months continue  - mupirocin  (BACTROBAN ) 2 % ointment; Apply topically 2 times a day. Twice daily x 7 days every month x 3. Nose, belly button  - chlorhexidine  (HIBICLENS ) 4 % external liquid; Apply topically once a week. As body wash from neck down  - Discussed children can carry Staph, patient is careful with exposires  - Alopecic patch likely will regrow, several vellous hairs observed    2.) Facial xerosis  - Vanicream facial wash and moisturizer  The patient indicates understanding of these issues and agrees with the plan.    Follow-up in 2 months  Douglass Gelineau, MD M.D.  08/26/2023    The HPI, ROS, physical exam, test results, and assessment & plan were reviewed and copied forward (with edits) from a note written by Douglass Gelineau, MD. I have reviewed and updated the history, physical exam, data, assessment, and plan of the note so that it reflects my evaluation and management of the patient.

## 2023-08-26 NOTE — Progress Notes (Signed)
 Maria Stevenson is a 38 y.o. Black or [place] American female established patient. Last visit March 2025    CC:   Chief Complaint   Patient presents with    Skin Problem     Pt here for follow up     HPI:    1.) Follow up for MSSA abscess on scalp and thigh. Also has history of hidradenitis suppurativa which was previously treated with  Excision, doxycycline , benzoyl peroxide 5% wash, clindamycin 1% gel and 4% chlorhexidine  gluconate wash, but currently not active. Scalp lesion patient thought was after change on hair care. Currently using [employer] essential, didn't like Royal oils.  Reports abscess were recurrent 1-3 per month. At prior visit culture of lesion on scalp: MSSA sensitive to doxycycline . Completed 4 weeks of doxycycline  100 mg twice daily, as well as 4% chlorhexidine  gluconate wash weekly and mupirocin  2% ointment twice daily nares, didn't do belly button. Doesn't work on health care, works in childcare facility. Lesion on scalp has resolved but with alopecic patch.  2.) Wants recommendations for dry facial skin    Other derm history  - Hibiclens , and clindamycin 1% gel    ADDITIONAL HISTORY:  I have reviewed past medical, social and surgical history, medications and allergies as documented in the patient's electronic medical record.    PHYSICAL EXAM:  The patient is alert, awake and oriented x 3, well-nourished, good humored and in no apparent distress.  Examination was performed of the following: scalp/hair and head/face  1.) Round patch of short hairs on left scalp  2.) Facial xerosis    ASSESSMENT AND PLAN:  1.) Recurrent furunculosis, post 4 weeks of doxycycline , currently in decolonization  - Recommend adding mupirocin  2% treatment in umbilicus.   -  If repeated: doxycycline  monohydrate (MONODOX ) 100 MG capsule; Take 1 capsule (100 mg total) by mouth 2 times a day with meals x 4 weeks. Take with non-dairy meals, and full glass of water . Stay upright for 1 hour after taking it. May cause severe  sunburn. Stop and call us  if you experience severe headaches. Contraindicated in pregnancy  For 3 more months continue  - mupirocin  (BACTROBAN ) 2 % ointment; Apply topically 2 times a day. Twice daily x 7 days every month x 3. Nose, belly button  - chlorhexidine  (HIBICLENS ) 4 % external liquid; Apply topically once a week. As body wash from neck down  - Discussed children can carry Staph, patient is careful with exposires  - Alopecic patch likely will regrow, several vellous hairs observed    2.) Facial xerosis  - Vanicream facial wash and moisturizer  The patient indicates understanding of these issues and agrees with the plan.    Follow-up in 2 months  Douglass Gelineau, MD M.D.  08/26/2023    The HPI, ROS, physical exam, test results, and assessment & plan were reviewed and copied forward (with edits) from a note written by Douglass Gelineau, MD. I have reviewed and updated the history, physical exam, data, assessment, and plan of the note so that it reflects my evaluation and management of the patient.

## 2023-09-22 DIAGNOSIS — M3322 Polymyositis with myopathy: Secondary | ICD-10-CM | POA: Diagnosis not present

## 2023-09-23 DIAGNOSIS — M3322 Polymyositis with myopathy: Secondary | ICD-10-CM | POA: Diagnosis not present

## 2023-10-22 ENCOUNTER — Encounter: Payer: Medicaid (Managed Care) | Attending: Oncology | Primary: Oncology

## 2023-11-13 ENCOUNTER — Encounter: Payer: Medicaid (Managed Care) | Attending: Oncology | Primary: Oncology

## 2023-11-15 ENCOUNTER — Emergency Department: Admit: 2023-11-16 | Payer: Medicaid (Managed Care) | Primary: Oncology

## 2023-11-15 DIAGNOSIS — N3000 Acute cystitis without hematuria: Principal | ICD-10-CM

## 2023-11-15 NOTE — Discharge Instructions (Signed)
 You were seen today for body aches and fever.  You are diagnosed with a urinary tract infection.  We are discharging you with antibiotics.  Please take as prescribed.  Please return to the emergency department for worsening symptoms.

## 2023-11-15 NOTE — ED Provider Notes (Signed)
 ED Attending Attestation Note     Date of evaluation: 11/15/2023    This patient was seen by the resident.  I have seen and examined the patient, agree with the workup, evaluation, management and diagnosis. The care plan has been discussed.  I have reviewed the ECG and concur with the resident's interpretation.  My assessment reveals patient with history of type 2 diabetes presents for fever, fatigue, and bodyaches.  Patient states that symptoms have been going on for approximately 2 weeks.  Patient denies any headache, sore throat, cough, vomiting, diarrhea, or urinary symptoms.  On arrival, patient is febrile at 100.7 and tachycardic into the she has clear breath sounds and normal heart sounds with a soft abdomen.  Will check laboratory studies, imaging.     Elaine Norleen ORN, MD  11/15/23 2224

## 2023-11-15 NOTE — ED Provider Notes (Signed)
 THE [facility]  EMERGENCY DEPARTMENT ENCOUNTER          EM RESIDENT NOTE       Date of evaluation: 11/15/2023    Chief Complaint     Fever, Chills, Fatigue, and Generalized Body Aches      History of Present Illness     Shelly Richard is a 38 y.o. female with history of type 2 diabetes and asthma who presents with 2 weeks of viral symptoms.  Patient reports that for 2 weeks she has been having diffuse body aches, chills and fevers.  Denies chest pain, shortness of breath, abdominal pain, nausea, vomiting, diarrhea.  Denies recent travel.  Denies treatment rashes.  Reports decreased appetite and has been drinking less fluids lately as well.  Denies throat pain, congestion, cough, sputum production    MEDICAL DECISION MAKING / ASSESSMENT / PLAN     INITIAL VITALS: BP: (!) 143/83, Temp: (!) 100.7 F (38.2 C), Pulse: (!) 125, Respirations: 18, SpO2: 100 %    Shelly Richard is a 38 y.o. female with past medical history of type 2 diabetes and asthma presenting with 2 weeks of viral symptoms.  Initial exam reveals very well-appearing patient with vital signs remarkable for tachycardia in the 120s.  Temperature shows a low-grade fever of 100.7.  Otherwise patient is not hypotensive and is satting well on room air with no signs respiratory distress.  Physical exam is largely unremarkable with no wheezing, abdominal pain or neurodeficits.  Basic workup obtained to assess for electrolyte disturbances and diabetic complications with a BMP, to assess for infection with CBC, COVID flu, UA and chest x-ray.    Overall initial exam and HPI is consistent with viral illness, tachycardia likely due to low-grade fever and dehydration in the setting of this illness.  EKG with sinus tachycardia.    Medical Decision Making  Amount and/or Complexity of Data Reviewed  Labs: ordered.  Radiology: ordered.  ECG/medicine tests: ordered.    Risk  OTC drugs.  Prescription drug management.      ED Course as of 11/15/23 2303   Sat Nov 15, 2023   2156 Patient given 1 L of IV fluids for dehydration and tachycardia.  CBC with mild leukocytosis of 14, hemoglobin 11.1.  Leukocytosis is likely due to viral infection.  BMP is remarkable for glucose of 399, anion gap is unremarkable, no concern for DKA.  Bicarb is slightly decreased at 20.  Otherwise no extreme Electra abnormalities and normal renal function a creatinine of 0.7.  Chest x-ray with no acute cardiopulmonary findings, no concern for pneumonia at this time.  COVID and flu not detected, likely another viral source for patient's symptoms [VM]   2258 Pregnancy test negative. UA with concern for UTI with greater than 100 WBCs, and trace leukocyte esterase.  Sent for culture.  No CVA tenderness bilaterally, no concern for pyelonephritis.  Will treat for UTI.  Pregnancy test negative. [VM]   2259 On repeat assessment, patient reports improved symptoms.  Heart rate is down to 106 and fever is gone.  Given 1 dose of antibiotics in the ED.  Remains hemodynamically stable and safe for discharge. [VM]      ED Course User Index  [VM] Loleta Fine, MD        Is this patient to be included in the SEP-1 core measure? No. High suspicion for viral etiology.     Clinical Impression     1. Acute cystitis without hematuria  Disposition     PATIENT REFERRED TO:  Alexandro Bernice BRAVO, [name]  [address], [address] [place] [address]  [phone]    Schedule an appointment as soon as possible for a visit in 1 week        DISCHARGE MEDICATIONS:  New Prescriptions    CEPHALEXIN  (KEFLEX ) 500 MG CAPSULE    Take 1 capsule by mouth 4 times daily       DISPOSITION Decision To Discharge 11/15/2023 10:59:58 PM   DISPOSITION CONDITION Stable             Diagnostic Results and Other Data     RADIOLOGY:  XR CHEST (2 VW)   Final Result      No acute cardiopulmonary findings.          Electronically signed by Chauncey Hipp, MD          LABS:   Results for orders placed or performed during the hospital  encounter of 11/15/23   COVID-19 & Influenza Combo    Specimen: Nasopharyngeal Swab   Result Value Ref Range    SARS-CoV-2 RNA, RT PCR NOT DETECTED NOT DETECTED    Influenza A NOT DETECTED NOT DETECTED    Influenza B NOT DETECTED NOT DETECTED   CBC with Auto Differential   Result Value Ref Range    WBC 14.0 (H) 4.0 - 11.0 K/uL    RBC 4.30 4.00 - 5.20 M/uL    Hemoglobin 11.1 (L) 12.0 - 16.0 g/dL    Hematocrit 66.1 (L) 36.0 - 48.0 %    MCV 78.6 (L) 80.0 - 100.0 fL    MCH 25.8 (L) 26.0 - 34.0 pg    MCHC 32.8 31.0 - 36.0 g/dL    RDW 83.8 (H) 87.5 - 15.4 %    Platelets 439 135 - 450 K/uL    MPV 8.7 5.0 - 10.5 fL    Neutrophils % 78.7 %    Lymphocytes % 13.8 %    Monocytes % 6.1 %    Eosinophils % 0.6 %    Basophils % 0.8 %    Neutrophils Absolute 11.1 (H) 1.7 - 7.7 K/uL    Lymphocytes Absolute 1.9 1.0 - 5.1 K/uL    Monocytes Absolute 0.9 0.0 - 1.3 K/uL    Eosinophils Absolute 0.1 0.0 - 0.6 K/uL    Basophils Absolute 0.1 0.0 - 0.2 K/uL   BMP w/ Reflex to MG   Result Value Ref Range    Sodium 133 (L) 136 - 145 mmol/L    Potassium reflex Magnesium  4.0 3.5 - 5.1 mmol/L    Chloride 97 (L) 99 - 110 mmol/L    CO2 20 (L) 21 - 32 mmol/L    Anion Gap 16 3 - 16    Glucose 399 (H) 70 - 99 mg/dL    BUN 8 7 - 20 mg/dL    Creatinine 0.7 0.6 - 1.1 mg/dL    Est, Glom Filt Rate >90 >60    Calcium  9.3 8.3 - 10.6 mg/dL   Urinalysis with Microscopic   Result Value Ref Range    Color, UA Yellow Straw/Yellow    Clarity, UA Clear Clear    Glucose, Ur >=1000 (A) Negative mg/dL    Bilirubin, Urine SMALL (A) Negative    Ketones, Urine >=80 (A) Negative mg/dL    Specific Gravity, UA 1.020 1.005 - 1.030    Blood, Urine SMALL (A) Negative    pH, Urine 6.0 5.0 -  8.0    Protein, UA 30 (A) Negative mg/dL    Urobilinogen, Urine 1.0 <2.0 E.U./dL    Nitrite, Urine Negative Negative    Leukocyte Esterase, Urine TRACE (A) Negative    Microscopic Examination YES     Urine Type Voided     Cellular Casts 2, UA 1-3 WBC (A) None Seen /LPF    WBC, UA >100 (A) 0 -  5 /HPF    RBC, UA 0-2 0 - 4 /HPF    Epithelial Cells, UA 2-5 0 - 5 /HPF    Bacteria, UA 1+ (A) None Seen /HPF   Pregnancy, urine   Result Value Ref Range    Pregnancy, Urine Negative Detects HCG level >20 MIU/mL     EKG   Interpreted in conjunction with emergencydepartment physician No att. providers found  Rhythm: sinus tachycardia  Rate: 120-130  Axis: normal  Ectopy: none  Conduction: normal  ST Segments: normal  T Waves:normal  Q Waves: none  Comparison: Sinus tachycardia with no signs of acute ischemia    ED BEDSIDE ULTRASOUND:  No results found.    RECENT VITALS:  BP: 136/76, Temp: 99.1 F (37.3 C), Pulse: (!) 106,Respirations: 24, SpO2: 97 %     Procedures     None    ED Course     Nursing Notes, Past Medical Hx, Past Surgical Hx, Social Hx, Allergies, and Family Hx were reviewed.      The patient was given the following medications:  Orders Placed This Encounter   Medications    lactated ringers  bolus 1,000 mL    acetaminophen  (TYLENOL ) tablet 650 mg    cephALEXin  (KEFLEX ) capsule 500 mg     Antimicrobial Indications:   Urinary Tract Infection    cephALEXin  (KEFLEX ) 500 MG capsule     Sig: Take 1 capsule by mouth 4 times daily     Dispense:  7 capsule     Refill:  0       CONSULTS:  None    Review of Systems     Review of Systems   All other systems reviewed and are negative.      Past Medical, Surgical, Family, and Social History     She has a past medical history of Allergies, Asthma, Diabetes mellitus (HCC), Genital herpes, HTN (hypertension), Hyperlipidemia, Hypothyroid, and Urticaria.  She has a past surgical history that includes cyst removal; hysteroscopy; and myomectomy (N/A, 02/01/2022).  Her family history includes Diabetes in her father and mother.  She reports that she has never smoked. She has never used smokeless tobacco. She reports that she does not drink alcohol and does not use drugs.    Medications     Previous Medications    ACETAMINOPHEN  (TYLENOL ) 500 MG TABLET    Take 1 tablet by mouth  4 times daily as needed for Pain    ALBUTEROL  SULFATE HFA 108 (90 BASE) MCG/ACT INHALER    Inhale 2 puffs into the lungs every 6 hours as needed for Wheezing or Shortness of Breath    BLOOD GLUCOSE MONITOR STRIPS    Tes2t qam  times a day & as needed for symptoms of irregular blood glucose. Please provide test strips approved by insurance.    BLOOD GLUCOSE MONITORING SUPPL (ACURA BLOOD GLUCOSE METER) W/DEVICE KIT    Please provide meter approved by insurance.  Check bs qam    CETIRIZINE  (ZYRTEC ) 10 MG TABLET    Take 1 tablet by mouth daily    CHOLECALCIFEROL (  VITAMIN D3) 50 MCG (2000 UT) TABS    TAKE ONE TABLET BY MOUTH DAILY    CONTINUOUS BLOOD GLUC SENSOR (FREESTYLE LIBRE 2 SENSOR) MISC    USE TO TEST BLOOD GLUCOSE AS DIRECTED    CONTINUOUS BLOOD GLUC SENSOR (FREESTYLE LIBRE 2 SENSOR) MISC    Apply every 2 weeks    CONTINUOUS BLOOD GLUC SENSOR (FREESTYLE LIBRE 3 SENSOR) MISC    Check glucose per diagnosis E11.9    CONTINUOUS BLOOD GLUC SENSOR (FREESTYLE LIBRE 3 SENSOR) MISC    Check glucose per diagnosis E11.9    CONTINUOUS BLOOD GLUC SENSOR (FREESTYLE LIBRE 3 SENSOR) MISC    Check glucose per diagnosis E11.9    DAPAGLIFLOZIN  (FARXIGA ) 10 MG TABLET    Take 1 tablet by mouth every morning    EPINEPHRINE (EPIPEN) 0.3 MG/0.3ML SOAJ INJECTION        FERROUS SULFATE  (FEROSUL) 325 (65 FE) MG TABLET    Take 1 tablet by mouth daily (with breakfast)    FUROSEMIDE  (LASIX ) 20 MG TABLET    Take 1 tablet by mouth daily as needed (swelling)    IBUPROFEN  (ADVIL ;MOTRIN ) 600 MG TABLET    Take 1 tablet by mouth every 6 hours as needed for Pain    INSULIN  DETEMIR (LEVEMIR  FLEXPEN) 100 UNIT/ML INJECTION PEN    INJECT 40 UNITS UNDER THE SKIN ONCE NIGHTLY    INSULIN  GLARGINE (LANTUS  SOLOSTAR) 100 UNIT/ML INJECTION PEN    Inject 40 Units into the skin nightly    INSULIN  LISPRO, 1 UNIT DIAL , (HUMALOG /ADMELOG ) 100 UNIT/ML SOPN    INJECT 10 UNITS UNDER THE SKIN WITH MEALS PLUS INJECT 2 UNITS FOR BLOOD SUGAR  150-200, 4 UNITS IF 201-250, 6  UNITS IF 251-300, 8 UNITS IF 301-350, 10 UNITS IF GREATER THAN 350    INSULIN  PEN NEEDLE (DROPLET PEN NEEDLES) 32G X 6 MM MISC    USE ONCE DAILY    INSULIN  PEN NEEDLE ([employer] PEN NEEDLES 29G) 29G X MISC    1 each by Does not apply route daily    INSULIN  PEN NEEDLE ([employer] PEN NEEDLES 31G) 31G X 8 MM MISC    1 each by Does not apply route daily    INSULIN  PEN NEEDLE 32G X 6 MM MISC    Use four times daily and as needed    LANCETS MISC    1 each by Does not apply route daily Please provide lancets approved by insurance.    LETROZOLE (FEMARA) 2.5 MG TABLET        LEVOTHYROXINE  (SYNTHROID ) 75 MCG TABLET    TAKE ONE TABLET BY MOUTH DAILY    LOSARTAN  (COZAAR ) 25 MG TABLET    TAKE ONE TABLET BY MOUTH DAILY    METFORMIN  (GLUCOPHAGE ) 500 MG TABLET    Take 2 tablets by mouth 2 times daily (with meals)    MONTELUKAST  (SINGULAIR ) 10 MG TABLET    Take 1 tablet by mouth daily    SEMAGLUTIDE , 1 MG/DOSE, (OZEMPIC , 1 MG/DOSE,) 4 MG/3ML SOPN SC INJECTION    DIAL  AND INJECT UNDER THE SKIN 1 MG EVERY 7 DAYS    SIMVASTATIN  (ZOCOR ) 40 MG TABLET    Take 1 tablet by mouth daily    VALACYCLOVIR  (VALTREX ) 500 MG TABLET    TAKE 1 TABLET BY MOUTH DAILY       Allergies     She is allergic to ace inhibitors, lisinopril, and peanuts [peanut oil].    Physical Exam     INITIAL VITALS:  BP: (!) 143/83, Temp: (!) 100.7 F (38.2 C), Pulse: (!) 125, Respirations: 18, SpO2: 100 %   Physical Exam  Constitutional:       General: She is not in acute distress.     Appearance: Normal appearance. She is not ill-appearing, toxic-appearing or diaphoretic.   HENT:      Head: Normocephalic and atraumatic.      Nose: Nose normal. No congestion.      Mouth/Throat:      Pharynx: No oropharyngeal exudate or posterior oropharyngeal erythema.      Comments: Mucous membranes are mildly dry  Eyes:      Conjunctiva/sclera: Conjunctivae normal.   Cardiovascular:      Rate and Rhythm: Regular rhythm. Tachycardia present.      Heart sounds: Normal heart sounds.    Pulmonary:      Effort: Pulmonary effort is normal.      Breath sounds: Normal breath sounds. No wheezing or rales.   Abdominal:      General: Abdomen is flat.      Palpations: Abdomen is soft.   Musculoskeletal:      Cervical back: Normal range of motion and neck supple. No rigidity or tenderness.      Right lower leg: No edema.      Left lower leg: No edema.   Skin:     General: Skin is warm and dry.   Neurological:      General: No focal deficit present.      Mental Status: She is alert and oriented to person, place, and time.              Loleta Fine, MD  Resident  11/15/23 [phone]

## 2023-11-15 NOTE — ED Notes (Signed)
 Pt discharged from ED in stable condition. Discharge instruction explain, all question answered. Prescription given. Pt walked to lobby independently.       Lorrene Helper, RN  11/15/23 [phone]

## 2023-11-15 NOTE — ED Triage Notes (Signed)
 Patient arrived in the ER with c/o fatigue, chills, fever, body aches. Patient has had these symptoms for the last two-week. Patient states that she has mold in her bathroom.

## 2023-11-16 ENCOUNTER — Inpatient Hospital Stay
Admit: 2023-11-16 | Discharge: 2023-11-16 | Disposition: A | Discharge status: Home / Self Care | Payer: Medicaid (Managed Care) | Arrived: WI | Attending: Emergency Medicine

## 2023-11-16 LAB — URINALYSIS WITH MICROSCOPIC
Glucose, Ur: >=1000 mg/dL — AB
Ketones, Urine: >=80 mg/dL — AB
Nitrite, Urine: NEGATIVE
Protein, UA: 30 mg/dL — AB
Specific Gravity, UA: 1.02 (ref 1.005–1.030)
Urobilinogen, Urine: 1 U/dL (ref <2.0)
WBC, UA: >100 /HPF — AB (ref 0–5)
pH, Urine: 6 (ref 5.0–8.0)

## 2023-11-16 LAB — CBC WITH AUTO DIFFERENTIAL
Basophils %: 0.8 %
Basophils Absolute: 0.1 K/uL (ref 0.0–0.2)
Eosinophils %: 0.6 %
Eosinophils Absolute: 0.1 K/uL (ref 0.0–0.6)
Hematocrit: 33.8 % — ABNORMAL LOW (ref 36.0–48.0)
Hemoglobin: 11.1 g/dL — ABNORMAL LOW (ref 12.0–16.0)
Lymphocytes %: 13.8 %
Lymphocytes Absolute: 1.9 K/uL (ref 1.0–5.1)
MCH: 25.8 pg — ABNORMAL LOW (ref 26.0–34.0)
MCHC: 32.8 g/dL (ref 31.0–36.0)
MCV: 78.6 fL — ABNORMAL LOW (ref 80.0–100.0)
MPV: 8.7 fL (ref 5.0–10.5)
Monocytes %: 6.1 %
Monocytes Absolute: 0.9 K/uL (ref 0.0–1.3)
Neutrophils %: 78.7 %
Neutrophils Absolute: 11.1 K/uL — ABNORMAL HIGH (ref 1.7–7.7)
Platelets: 439 K/uL (ref 135–450)
RBC: 4.3 M/uL (ref 4.00–5.20)
RDW: 16.1 % — ABNORMAL HIGH (ref 12.4–15.4)
WBC: 14 K/uL — ABNORMAL HIGH (ref 4.0–11.0)

## 2023-11-16 LAB — EKG 12-LEAD
Atrial Rate: 122 {beats}/min
P Axis: 53 degrees
P-R Interval: 150 ms
Q-T Interval: 304 ms
QRS Duration: 68 ms
QTc Calculation (Bazett): 433 ms
R Axis: 14 degrees
T Axis: 7 degrees
Ventricular Rate: 122 {beats}/min

## 2023-11-16 LAB — COVID-19 & INFLUENZA COMBO
Influenza A: NOT DETECTED
Influenza B: NOT DETECTED
SARS-CoV-2 RNA, RT PCR: NOT DETECTED

## 2023-11-16 LAB — BASIC METABOLIC PANEL W/ REFLEX TO MG FOR LOW K
Anion Gap: 16 (ref 3–16)
BUN: 8 mg/dL (ref 7–20)
CO2: 20 mmol/L — ABNORMAL LOW (ref 21–32)
Calcium: 9.3 mg/dL (ref 8.3–10.6)
Chloride: 97 mmol/L — ABNORMAL LOW (ref 99–110)
Creatinine: 0.7 mg/dL (ref 0.6–1.1)
Est, Glom Filt Rate: >90 (ref >60)
Glucose: 399 mg/dL — ABNORMAL HIGH (ref 70–99)
Potassium reflex Magnesium: 4 mmol/L (ref 3.5–5.1)
Sodium: 133 mmol/L — ABNORMAL LOW (ref 136–145)

## 2023-11-16 LAB — PREGNANCY, URINE: Pregnancy, Urine: NEGATIVE

## 2023-11-16 MED ORDER — ACETAMINOPHEN 325 MG PO TABS
325 | Freq: Once | ORAL | Status: AC
Start: 2023-11-16 — End: 2023-11-15
  Administered 2023-11-16: 02:00:00 650 mg via ORAL

## 2023-11-16 MED ORDER — CEPHALEXIN 500 MG PO CAPS
500 | ORAL_CAPSULE | Freq: Four times a day (QID) | ORAL | 0 refills | 7.00000 days | Status: DC
Start: 2023-11-16 — End: 2024-03-26

## 2023-11-16 MED ORDER — LACTATED RINGERS IV BOLUS
Freq: Once | INTRAVENOUS | Status: AC
Start: 2023-11-16 — End: 2023-11-15
  Administered 2023-11-16: 02:00:00 1000 mL via INTRAVENOUS

## 2023-11-16 MED ORDER — CEPHALEXIN 500 MG PO CAPS
500 | Freq: Once | ORAL | Status: AC
Start: 2023-11-16 — End: 2023-11-15
  Administered 2023-11-16: 03:00:00 500 mg via ORAL

## 2023-11-16 MED FILL — CEPHALEXIN 500 MG PO CAPS: 500 mg | ORAL | Qty: 1 | Fill #0

## 2023-11-16 MED FILL — ACETAMINOPHEN 325 MG PO TABS: 325 mg | ORAL | Qty: 2 | Fill #0

## 2023-11-25 ENCOUNTER — Ambulatory Visit: Admit: 2023-11-25 | Discharge: 2023-11-25 | Payer: Medicaid (Managed Care) | Attending: Oncology | Primary: Oncology

## 2023-11-25 ENCOUNTER — Encounter

## 2023-11-25 VITALS — BP 110/72 | HR 100 | Temp 98.50000°F | Ht 64.0 in | Wt 181.0 lb

## 2023-11-25 DIAGNOSIS — N3090 Cystitis, unspecified without hematuria: Principal | ICD-10-CM

## 2023-11-25 MED ORDER — METFORMIN HCL ER 500 MG PO TB24
500 | ORAL_TABLET | Freq: Every day | ORAL | 1 refills | 90.00000 days | Status: DC
Start: 2023-11-25 — End: 2024-04-20

## 2023-11-25 NOTE — Progress Notes (Signed)
 Shelly Richard (DOB:  Feb 17, 1986) is a 38 y.o. female,Established patient, here for evaluation of the following chief complaint(s):  Annual Exam      ASSESSMENT/PLAN:  Assessment & Plan  Cystitis   Reviewed the ER workup and documentation.  Still having some symptoms.  Did not complete Keflex .  Will recheck UA and culture.   Mild intermittent asthma without complication   Chronic, at goal (stable), continue albuterol  as needed  Type 2 diabetes mellitus with hyperglycemia, with long-term current use of insulin  (HCC)   Chronic, not at goal (unstable), has not been taking medications for several months.  Recently started taking them again a few days ago.  Follow-up in 1 month.  Using CGM.  Continue Ozempic , metformin , Levemir , Humalog , Farxiga    Class 1 obesity due to excess calories with serious comorbidity and body mass index (BMI) of 33.0 to 33.9 in adult   Chronic, at goal (stable), lifestyle modifications recommended  Essential hypertension   Chronic, at goal (stable), continue losartan   Mixed hyperlipidemia   Chronic, not at goal (unstable), continue simvastatin .  Check lipids.  Iron deficiency anemia, unspecified iron deficiency anemia type   Chronic, not at goal (unstable), continue iron supplement.  Check CBC.        No follow-ups on file.    SUBJECTIVE/OBJECTIVE:    History of Present Illness  The patient is a 38 year old female who presents for a physical exam.    She has been unwell for the past month, with a diagnosis of urinary tract infection (UTI) made during an ER visit on 11/15/2023. Despite treatment with Keflex  500 mg four times daily, she continues to feel unwell and is considering a retest for UTI. She did not complete the full course of Keflex  due to diarrhea. She reports no urinary urgency, frequency, or burning sensation. However, she experiences fevers at night, accompanied by chills, and has been experiencing pain on her side for the past few days. Her kidney function was reported as  normal.    She had discontinued her medications for the past month due to illness and loss of appetite but resumed them on Sunday. Her blood sugar level was recorded at 300. She is currently on Levemir  40 units, Ozempic  once a week, Humalog  10 units with meals, metformin  1000 mg twice daily, and Farxiga . She uses a continuous glucose monitor. She also reports diarrhea, which she attributes to metformin .    She has not had a recent Pap smear and does not have a gynecologist. Her last eye exam was in 12/2022.    Hobbies: Camping      Current Outpatient Medications   Medication Sig Dispense Refill    metFORMIN  (GLUCOPHAGE -XR) 500 MG extended release tablet Take 4 tablets by mouth daily (with breakfast) 360 tablet 1    cephALEXin  (KEFLEX ) 500 MG capsule Take 1 capsule by mouth 4 times daily 7 capsule 0    Semaglutide , 1 MG/DOSE, (OZEMPIC , 1 MG/DOSE,) 4 MG/3ML SOPN sc injection DIAL  AND INJECT UNDER THE SKIN 1 MG EVERY 7 DAYS 3 mL 2    insulin  glargine (LANTUS  SOLOSTAR) 100 UNIT/ML injection pen Inject 40 Units into the skin nightly 5 Adjustable Dose Pre-filled Pen Syringe 0    insulin  detemir (LEVEMIR  FLEXPEN) 100 UNIT/ML injection pen INJECT 40 UNITS UNDER THE SKIN ONCE NIGHTLY 6 mL 3    ferrous sulfate  (FEROSUL) 325 (65 Fe) MG tablet Take 1 tablet by mouth daily (with breakfast) 30 tablet 2    Insulin  Pen Needle (DROPLET  PEN NEEDLES) 32G X 6 MM MISC USE ONCE DAILY 100 each 3    Continuous Blood Gluc Sensor (FREESTYLE LIBRE 2 SENSOR) MISC Apply every 2 weeks 12 each 1    dapagliflozin  (FARXIGA ) 10 MG tablet Take 1 tablet by mouth every morning 90 tablet 1    simvastatin  (ZOCOR ) 40 MG tablet Take 1 tablet by mouth daily 90 tablet 1    cetirizine  (ZYRTEC ) 10 MG tablet Take 1 tablet by mouth daily 90 tablet 1    levothyroxine  (SYNTHROID ) 75 MCG tablet TAKE ONE TABLET BY MOUTH DAILY 90 tablet 1    montelukast  (SINGULAIR ) 10 MG tablet Take 1 tablet by mouth daily 90 tablet 1    furosemide  (LASIX ) 20 MG tablet Take 1 tablet  by mouth daily as needed (swelling) 60 tablet 3    insulin  lispro, 1 Unit Dial , (HUMALOG /ADMELOG ) 100 UNIT/ML SOPN INJECT 10 UNITS UNDER THE SKIN WITH MEALS PLUS INJECT 2 UNITS FOR BLOOD SUGAR  150-200, 4 UNITS IF 201-250, 6 UNITS IF 251-300, 8 UNITS IF 301-350, 10 UNITS IF GREATER THAN 350 12 mL 3    Cholecalciferol (VITAMIN D3) 50 MCG (2000 UT) TABS TAKE ONE TABLET BY MOUTH DAILY 30 tablet 3    ibuprofen  (ADVIL ;MOTRIN ) 600 MG tablet Take 1 tablet by mouth every 6 hours as needed for Pain 120 tablet 0    acetaminophen  (TYLENOL ) 500 MG tablet Take 1 tablet by mouth 4 times daily as needed for Pain 120 tablet 0    Continuous Blood Gluc Sensor (FREESTYLE LIBRE 2 SENSOR) MISC USE TO TEST BLOOD GLUCOSE AS DIRECTED 14 each 2    letrozole (FEMARA) 2.5 MG tablet       losartan  (COZAAR ) 25 MG tablet TAKE ONE TABLET BY MOUTH DAILY 90 tablet 1    Insulin  Pen Needle 32G X 6 MM MISC Use four times daily and as needed 100 each 5    EPINEPHrine (EPIPEN) 0.3 MG/0.3ML SOAJ injection       blood glucose monitor strips Tes2t qam  times a day & as needed for symptoms of irregular blood glucose. Please provide test strips approved by insurance. 100 strip 2    albuterol  sulfate HFA 108 (90 Base) MCG/ACT inhaler Inhale 2 puffs into the lungs every 6 hours as needed for Wheezing or Shortness of Breath 1 Inhaler 5    Insulin  Pen Needle ([employer] PEN NEEDLES 31G) 31G X 8 MM MISC 1 each by Does not apply route daily 100 each 3    Insulin  Pen Needle ([employer] PEN NEEDLES 29G) 29G X MISC 1 each by Does not apply route daily 100 each 3    Lancets MISC 1 each by Does not apply route daily Please provide lancets approved by insurance. 100 each 3    Blood Glucose Monitoring Suppl (ACURA BLOOD GLUCOSE METER) w/Device KIT Please provide meter approved by insurance.  Check bs qam 1 kit 0    valACYclovir  (VALTREX ) 500 MG tablet TAKE 1 TABLET BY MOUTH DAILY (Patient not taking: Reported on 11/25/2023) 90 tablet 5     No current facility-administered  medications for this visit.     MEDICATION LIST REVIEWED AND UPDATED AT TIME OF VISIT       Review of Systems   Constitutional:  Positive for fatigue. Negative for chills and fever.   HENT:  Negative for congestion, sinus pressure and sinus pain.    Respiratory:  Negative for cough, shortness of breath and wheezing.    Cardiovascular:  Negative  for chest pain and palpitations.   Gastrointestinal:  Negative for abdominal pain, constipation and diarrhea.   Genitourinary:  Positive for flank pain and frequency. Negative for dysuria and hematuria.   Musculoskeletal:  Positive for arthralgias and myalgias. Negative for back pain.   Skin:  Negative for color change, pallor and rash.   Neurological:  Negative for dizziness, syncope, weakness, light-headedness and headaches.   Psychiatric/Behavioral:  Negative for behavioral problems, confusion and sleep disturbance. The patient is not nervous/anxious.        Vitals:    11/25/23 1341   BP: 110/72   BP Site: Right Upper Arm   Patient Position: Sitting   BP Cuff Size: Medium Adult   Pulse: 100   Temp: 98.5 F (36.9 C)   SpO2: 98%   Weight: 82.1 kg (181 lb)   Height: 1.626 m (5' 4)       Physical Exam  Constitutional:       Appearance: She is well-developed. She is obese.   HENT:      Head: Normocephalic and atraumatic.   Eyes:      Conjunctiva/sclera: Conjunctivae normal.      Pupils: Pupils are equal, round, and reactive to light.   Neck:      Thyroid: No thyromegaly.      Vascular: No JVD.   Cardiovascular:      Rate and Rhythm: Normal rate and regular rhythm.      Heart sounds: Normal heart sounds.   Pulmonary:      Effort: Pulmonary effort is normal. No respiratory distress.      Breath sounds: Normal breath sounds. No wheezing.   Abdominal:      Tenderness: There is no right CVA tenderness or left CVA tenderness.   Musculoskeletal:         General: Normal range of motion.      Cervical back: Normal range of motion and neck supple.   Skin:     General: Skin is warm and  dry.      Capillary Refill: Capillary refill takes less than 2 seconds.   Neurological:      Mental Status: She is alert and oriented to person, place, and time.   Psychiatric:         Behavior: Behavior normal.           The patient (or guardian, if applicable) and other individuals in attendance with the patient were advised that Artificial Intelligence will be utilized during this visit to record, process the conversation to generate a clinical note, and support improvement of the AI technology. The patient (or guardian, if applicable) and other individuals in attendance at the appointment consented to the use of AI, including the recording.          An electronic signature was used to authenticate this note.    --Bernice FORBES Shields, [name]

## 2023-11-25 NOTE — Assessment & Plan Note (Signed)
 Chronic, not at goal (unstable), continue iron supplement.  Check CBC.

## 2023-11-25 NOTE — Assessment & Plan Note (Signed)
Chronic, at goal (stable), continue losartan

## 2023-11-25 NOTE — Patient Instructions (Signed)
 Pt declines SDOH resources.

## 2023-11-25 NOTE — Assessment & Plan Note (Signed)
 Chronic, at goal (stable), continue albuterol as needed

## 2023-11-25 NOTE — Assessment & Plan Note (Signed)
 Chronic, not at goal (unstable), has not been taking medications for several months.  Recently started taking them again a few days ago.  Follow-up in 1 month.  Using CGM.  Continue Ozempic , metformin , Levemir , Humalog , Farxiga 

## 2023-11-25 NOTE — Assessment & Plan Note (Signed)
 Chronic, at goal (stable), lifestyle modifications recommended

## 2023-11-25 NOTE — Assessment & Plan Note (Signed)
 Chronic, not at goal (unstable), continue simvastatin .  Check lipids.

## 2023-11-26 LAB — ALBUMIN/CREATININE RATIO, URINE
Albumin Urine: <1.2 mg/dL (ref <2.0)
Creatinine, Ur: 70.6 mg/dL (ref 28.0–259.0)

## 2023-11-26 LAB — CBC WITH AUTO DIFFERENTIAL
Basophils %: 1.2 %
Basophils Absolute: 0.1 K/uL (ref 0.0–0.2)
Eosinophils %: 1.1 %
Eosinophils Absolute: 0.1 K/uL (ref 0.0–0.6)
Hematocrit: 32.1 % — ABNORMAL LOW (ref 36.0–48.0)
Hemoglobin: 10.6 g/dL — ABNORMAL LOW (ref 12.0–16.0)
Lymphocytes %: 20.7 %
Lymphocytes Absolute: 2.3 K/uL (ref 1.0–5.1)
MCH: 25.8 pg — ABNORMAL LOW (ref 26.0–34.0)
MCHC: 33.1 g/dL (ref 31.0–36.0)
MCV: 78 fL — ABNORMAL LOW (ref 80.0–100.0)
MPV: 9.3 fL (ref 5.0–10.5)
Monocytes %: 6.3 %
Monocytes Absolute: 0.7 K/uL (ref 0.0–1.3)
Neutrophils %: 70.7 %
Neutrophils Absolute: 7.9 K/uL — ABNORMAL HIGH (ref 1.7–7.7)
Platelets: 522 K/uL — ABNORMAL HIGH (ref 135–450)
RBC: 4.11 M/uL (ref 4.00–5.20)
RDW: 16.1 % — ABNORMAL HIGH (ref 12.4–15.4)
WBC: 11.1 K/uL — ABNORMAL HIGH (ref 4.0–11.0)

## 2023-11-26 LAB — URINALYSIS
Bilirubin, Urine: NEGATIVE
Blood, Urine: NEGATIVE
Glucose, Ur: >=1000 mg/dL — AB
Ketones, Urine: NEGATIVE mg/dL
Leukocyte Esterase, Urine: NEGATIVE
Nitrite, Urine: NEGATIVE
Protein, UA: NEGATIVE mg/dL
Specific Gravity, UA: 1.039 (ref 1.005–1.030)
Urobilinogen, Urine: 0.2 U/dL (ref <2.0)
pH, Urine: 6 (ref 5.0–8.0)

## 2023-11-26 LAB — LIPID PANEL
Cholesterol, Total: 154 mg/dL (ref 0–199)
HDL: 22 mg/dL — ABNORMAL LOW (ref 40–60)
LDL Cholesterol: 98 mg/dL (ref <100)
Triglycerides: 170 mg/dL — ABNORMAL HIGH (ref 0–150)
VLDL Cholesterol Calculated: 34 mg/dL

## 2023-11-26 LAB — COMPREHENSIVE METABOLIC PANEL
ALT: 16 U/L (ref 10–40)
AST: 15 U/L (ref 15–37)
Albumin/Globulin Ratio: 0.8 — ABNORMAL LOW (ref 1.1–2.2)
Albumin: 3.5 g/dL (ref 3.4–5.0)
Alkaline Phosphatase: 154 U/L — ABNORMAL HIGH (ref 40–129)
Anion Gap: 12 (ref 3–16)
BUN: 4 mg/dL — ABNORMAL LOW (ref 7–20)
CO2: 24 mmol/L (ref 21–32)
Calcium: 9.5 mg/dL (ref 8.3–10.6)
Chloride: 103 mmol/L (ref 99–110)
Creatinine: 0.6 mg/dL (ref 0.6–1.1)
Est, Glom Filt Rate: >90 (ref >60)
Glucose: 260 mg/dL — ABNORMAL HIGH (ref 70–99)
Potassium: 4.5 mmol/L (ref 3.5–5.1)
Sodium: 139 mmol/L (ref 136–145)
Total Bilirubin: 0.7 mg/dL (ref 0.0–1.0)
Total Protein: 7.7 g/dL (ref 6.4–8.2)

## 2023-11-26 LAB — HEMOGLOBIN A1C
Estimated Avg Glucose: 309.2 mg/dL
Hemoglobin A1C: 12.4 %

## 2023-11-26 MED ORDER — FERROUS SULFATE 325 (65 FE) MG PO TABS
325 | ORAL_TABLET | Freq: Every day | ORAL | 2 refills | 30.00000 days | Status: DC
Start: 2023-11-26 — End: 2024-04-20

## 2023-11-26 MED ORDER — VITAMIN D3 50 MCG (2000 UT) PO TABS
50 | ORAL_TABLET | ORAL | 3 refills | 30.00000 days | Status: DC
Start: 2023-11-26 — End: 2024-04-20

## 2023-11-26 NOTE — Telephone Encounter (Signed)
 LOV: 11/25/2023  FOV: 12/30/2023

## 2023-11-26 NOTE — Progress Notes (Signed)
 Formatting of this note is different from the original.  Subjective   Subjective:  Shelly Richard is a 38 year old female who presents today with   Chief Complaint   Patient presents with    Annual Exam     No c/o     OB History   Gravida Para Term Preterm AB Living   2 2 2   2    SAB IAB Ectopic Multiple Live Births       2     # Outcome Date GA Lbr Len/2nd Weight Sex Type Anes PTL Lv   2 Term 06/30/07    F NSVD NONE   LIV   1 Term 10/31/03    F NSVD EPI  LIV     Last Menstrual Period:      Menstrual History:  Periods are regular q 28-30 days, lasting 4 days.   Dysmenorrhea :none. Cyclic symptoms include none. No intermenstrual bleeding, spotting, or discharge.    Contraception Used:None  Past Contraception: None    Pap History:  Last PAP:   CYTOLOGY-GYN (no units)   Date Value   10/08/2022     CYTOLOGY GYNECOLOGICAL REPORT    Name: Shelly Richard  EPI#: [id number]  Acct#: [id number]    Case #: [id number]        Final Cytologic Diagnosis  A.  Thinprep Cervical/Endocervical with HPV screen and 16/18 Genotyping if  Indicated:   Adequacy:       Satisfactory for evaluation, endocervical transformation zone component  present.  Interpretation:       Negative for intraepithelial lesion or malignancy.       Fungal organisms morphologically consistent with Candida spp.      This specimen has been analyzed by the ThinPrep Imaging System [employer]  an automated imaging and review system, which assists the cytotechnologist  and/or pathologist in evaluation of cells on Thinprep Pap tests.  Electronically Signed Out By Suzen Flies, CT(ASCP)         HPV HIGH RISK WITH REFLEX      Not Detected        10/10/2022           Text/Comments:  This test was performed using the FDA Approved APTIMA HPV mRNA assay  which detects E6/E7 messenger RNA of High Risk HPV types (16, 18, 31,  33, 35, 39, 45, 51, 52, 56, 58, 59, 66, and 68).     This assay is intended for use in women 21 years or older with ASC-US   cervical cytology or  women 30 years or older. This assay is not  intended to substitute for regular cervical cytology screening.  Detection of HPV using the APTIMA HPV Assay does not differentiate  HPV types and cannot evaluate persistence of any one type. The use of  this assay has not been evaluated for the management of HPV  vaccinated women, women with prior ablative or excisional therapy,  hysterectomy, or who are pregnant. Sensitivities may be affected by  collection methods, stage of infection, and the presence of  interfering substances. Results of this assay should be interpreted  in conjunction with other available laboratory and clinical data.               Thin Prep        10/09/2022                 CERVIX        10/09/2022  Source of Specimen(s)  A: Thinprep Cervical/Endocervical with HPV screen and 16/18 Genotyping if  Indicated    Clinical History  Date of Last Menstrual Period:     09/10/2022  Menstrual History:  Normal    Signed out at [facility], [address], [place]  [address]    -------------- [facility] Non-Formatted Report --------------      Any abnormal pap smears?  no  Menstrual problems, pain?  no  PMS symptoms?  no  History of sexually transmitted diseases?  no  Abnormal discharge? no    Health Maintenance   Topic Date Due    DTap,Tdap,and Td (4 - Td or Tdap) 11/13/2025     Past Medical History:   Diagnosis Date    Abnormal uterine bleeding (AUB)     Asthma     Diabetes mellitus type 2, controlled, without complications     Hx of dilation and curettage     Hypothyroidism     Infertility, female     Iron  deficiency anemia     Menorrhagia with regular cycle     Trichomonas infection     Uterine leiomyoma      Past Surgical History:   Procedure Laterality Date    BELPHAROPTOSIS REPAIR Left     HC LAPAROSCOPIC MYOMECTOMY      HYSTEROSCOPY W/ POLYPECTOMY         Social History     Tobacco Use    Smoking status: Never    Smokeless tobacco: Never   Vaping Use    Vaping status: Never  Used   Substance Use Topics    Alcohol use: No    Drug use: No     Social History     Tobacco Use   Smoking Status Never   Smokeless Tobacco Never     Counseling given: Not Answered    History reviewed. No pertinent family history.    Allergies   Allergen Reactions    Lisinopril Angioedema    Semaglutide  Hives    Peanut Oil Swelling     THROAT ITCHES    THROAT ITCHES   Walnuts     Outpatient Medications Marked as Taking for the 11/26/23 encounter (Office Visit) with Foley, Chauncey Sharper, MD   Medication Sig Dispense Refill    nystatin-triamcinolone (MYCOLOG) 100000-0.1 UNIT/GM-% OINT Apply  topically 2 (two) times daily as needed for up to 90 days. 60 g 1     ROS    Vitals:    11/26/23 1433   BP: 120/74   Weight: 183 lb (83 kg)   Height: 61 (154.9 cm)         Objective   Objective:    Physical Exam      Assessment & Plan:    breast self exam and family planning choices    Shelly Richard was seen today for annual exam.    Diagnoses and all orders for this visit:    Annual physical exam    Encounter for gynecological examination (general) (routine) without abnormal findings    Submucous leiomyoma of uterus  Comments:  Prior myomectomy.  Patient does desire pregnancy.  Once blood sugars better controlled then will return for follow-up    Vulvar irritation  Comments:  Encouraged better glucose control.  Will use topical nystatin triamcinolone as needed.  Also discussed use of Desitin for vulvar skin protection    Other orders  -     AMBRY RISK ASSESSMENT REPORT  -     nystatin-triamcinolone (  MYCOLOG) 100000-0.1 UNIT/GM-% OINT; Apply  topically 2 (two) times daily as needed for up to 90 days.      Electronically signed by Shelly Chauncey Sharper, MD at 11/26/2023  5:34 PM EDT

## 2023-11-27 LAB — CULTURE, URINE
Urine Culture, Routine: <10000
Urine Culture, Routine: <10000 — AB

## 2023-11-27 NOTE — Telephone Encounter (Signed)
 Forms completed

## 2023-12-01 NOTE — Telephone Encounter (Signed)
 LOV:11/25/2023  FOV:12/30/2023

## 2023-12-02 MED ORDER — FREESTYLE LIBRE 2 SENSOR MISC
1 refills | Status: DC
Start: 2023-12-02 — End: 2024-04-20

## 2023-12-02 MED ORDER — INSULIN LISPRO (1 UNIT DIAL) 100 UNIT/ML SC SOPN
100 | SUBCUTANEOUS | 3 refills | 37.00000 days | Status: DC
Start: 2023-12-02 — End: 2024-04-07

## 2023-12-02 MED ORDER — OZEMPIC (1 MG/DOSE) 4 MG/3ML SC SOPN
4 | SUBCUTANEOUS | 1 refills | 28.00000 days | Status: DC
Start: 2023-12-02 — End: 2024-04-20

## 2023-12-02 MED ORDER — INSULIN DETEMIR 100 UNIT/ML SC SOPN
100 | SUBCUTANEOUS | 3 refills | Status: DC
Start: 2023-12-02 — End: 2024-04-07

## 2023-12-02 NOTE — Telephone Encounter (Signed)
 Requested Prescriptions     Pending Prescriptions Disp Refills    Semaglutide , 1 MG/DOSE, (OZEMPIC , 1 MG/DOSE,) 4 MG/3ML SOPN sc injection 3 mL 2     LOV 11/25/23  FOA 12/30/23

## 2023-12-08 DIAGNOSIS — I781 Nevus, non-neoplastic: Secondary | ICD-10-CM | POA: Diagnosis not present

## 2023-12-08 NOTE — Telephone Encounter (Signed)
 Rec'd a refill request thru CMM for patients Ozempic .     This was Denied in January. Will resubmit with updated A1c.    Submitted PA for Ozempic  (1 MG/DOSE) 4MG /3ML pen-injectors  Via CMM (Key: [id number]) STATUS: PENDING.    Follow up done daily.      If no response has been received by the sixth day, we will contact the insurance company for a status update.

## 2023-12-11 DIAGNOSIS — M3322 Polymyositis with myopathy: Secondary | ICD-10-CM | POA: Diagnosis not present

## 2023-12-17 DIAGNOSIS — M351 Other overlap syndromes: Secondary | ICD-10-CM | POA: Diagnosis not present

## 2023-12-17 DIAGNOSIS — J984 Other disorders of lung: Secondary | ICD-10-CM | POA: Diagnosis not present

## 2023-12-17 DIAGNOSIS — D649 Anemia, unspecified: Secondary | ICD-10-CM | POA: Diagnosis not present

## 2023-12-17 DIAGNOSIS — M329 Systemic lupus erythematosus, unspecified: Secondary | ICD-10-CM | POA: Diagnosis not present

## 2023-12-17 DIAGNOSIS — M3322 Polymyositis with myopathy: Secondary | ICD-10-CM | POA: Diagnosis not present

## 2023-12-17 DIAGNOSIS — I272 Pulmonary hypertension, unspecified: Secondary | ICD-10-CM | POA: Diagnosis not present

## 2023-12-17 DIAGNOSIS — J849 Interstitial pulmonary disease, unspecified: Secondary | ICD-10-CM | POA: Diagnosis not present

## 2023-12-25 DIAGNOSIS — Z79899 Other long term (current) drug therapy: Secondary | ICD-10-CM | POA: Diagnosis not present

## 2023-12-25 DIAGNOSIS — M351 Other overlap syndromes: Secondary | ICD-10-CM | POA: Diagnosis not present

## 2023-12-30 ENCOUNTER — Encounter: Payer: Medicaid (Managed Care) | Attending: Oncology | Primary: Oncology

## 2024-01-02 DIAGNOSIS — H43813 Vitreous degeneration, bilateral: Secondary | ICD-10-CM | POA: Diagnosis not present

## 2024-01-02 DIAGNOSIS — Z79899 Other long term (current) drug therapy: Secondary | ICD-10-CM | POA: Diagnosis not present

## 2024-01-02 DIAGNOSIS — H04123 Dry eye syndrome of bilateral lacrimal glands: Secondary | ICD-10-CM | POA: Diagnosis not present

## 2024-01-13 ENCOUNTER — Encounter: Payer: Medicaid (Managed Care) | Attending: Oncology | Primary: Oncology

## 2024-02-10 NOTE — Telephone Encounter (Signed)
"  The medication OZEMPIC  is APPROVED from 12/07/2023 to 12/06/2024.    Pharmacy    Auth / Case # [id number] .    Please notify the patient.    If this requires a response please respond to the pool ( P MHCX PSC MEDICATION PRE-AUTH).      "

## 2024-03-02 ENCOUNTER — Ambulatory Visit: Payer: Medicaid (Managed Care) | Primary: Oncology

## 2024-03-10 NOTE — HIE Documentation (Unsigned)
 "    Transcription Designer, Multimedia Text  ------------------------------------------------------------------------------  --  Attestation signed by Milissa Juliene HERO., MD at 03/15/24 1400  EMERGENCY DEPARTMENT - ATTENDING NOTE      I have personally seen and examined this patient. I have fully   participated  in the care of this patient with the resident/APP. I have performed the  substantive portion of the medical decision making.  I have reviewed and agree  with all pertinent clinical information including history, physical exam,   labs,  radiographic studies, and the plan.  I approve the management plan and take  responsibility for that plan and management.  IJuliene Milissa, MD, have personally obtained a history and examined the   patient.  They present for left sided neck tightness/soreness and headache. Their  pertinent exam findings are discussed in MDM below .  Diagnostic, treatment, and disposition decisions were made by myself in  conjunction with the resident or advanced practice provider.  I have also  reviewed and agree with the HPI, exam, past medical, family and social history  unless otherwise noted.  If appropriate, my independent interpretation of patient's labs/imaging/EKG   are  interpreted in ED Course below:  MDM  38y F p/w L neck spasm and headache without red flag symptoms. Hemodynamically  stable, afebrile, SIRS negative. No meningismus on exam. Neurologically   intact.  CT negative. Symptoms well controlled on reassessment after migraine cocktail.  Discussed strict return precautions with patient who verbalized understanding  and was amenable to dc home. Stable for discharge.  1. Acute strain of neck muscle, initial encounter  2. Tension headache  For all further details of the patient's emergency department visit, please   see  the resident or advanced practice provider's documentation.  Electronically signed by Juliene HERO. Milissa, MD  Electronically signed by:  Juliene HERO. Milissa,  MD  ------------------------------------------------------------------------------  --  [facility] EMERGENCY DEPARTMENT  ED PATIENT ENCOUNTER ARRIVAL: 03/10/24 2301  CSN: [id number]     HAR: [id number]  History  Chief Complaint  Patient presents with  ?? Headache  ?? Neck Pain    Patient arrived to ED with c/o left sided neck pain and headache since sept.  Denies injury. Denies fever and chills. Alert and oriented x4. Steady gait in  triage  38 year old female presents the emergency department with complaints of  headache.  Patient reports that it starts in the back left side of her scalp  near the insertion of her neck and radiates up around her head.  Patient   denies  blurred vision, double vision, aphasia, ataxia.  Patient denies photophobia,  phonophobia.  Patient reports the symptoms been on and off since September.  Patient denies any injury or trauma to the area.  Patient denies fever,   chills.  Patient denies nausea, vomiting.  Patient denies any other complaints.  External medical record review: Office visit with OB/GYN on 11/26/2023.  Past Medical History:  Diagnosis Date  ?? Abnormal uterine bleeding (AUB)  ?? Asthma  ?? Diabetes mellitus type 2, controlled, without complications (HCC)  ?? Hx of dilation and curettage  ?? Hypothyroidism  ?? Infertility, female  ?? Iron  deficiency anemia  ?? Menorrhagia with regular cycle  ?? Trichomonas infection  ?? Uterine leiomyoma  Past Surgical History:  Procedure Laterality Date  ?? BELPHAROPTOSIS REPAIR Left  ?? HC LAPAROSCOPIC MYOMECTOMY  ?? HYSTEROSCOPY W/ POLYPECTOMY  Prior to Admission medications  Medication Sig Start Date End Date Taking? Authorizing Provider  fluconazole  (DIFLUCAN ) 150 mg tablet Take 1 tablet by mouth every third day as  needed for up to 2 doses. 03/31/23   Jenette Augustin LABOR., MD  ferrous sulfate  325 (65 FE) MG TABS Take 325 mg by mouth. 08/06/22   HISTORICAL  MED  LEVEMIR  FLEXPEN 100 UNIT/ML SOPN pen 40 Units. 05/22/22   HISTORICAL  MED  insulin  lispro (HUMALOG ) 100 Units/mL SOLN subcutaneous injection Inject    under  the skin.    HISTORICAL MED  montelukast  (SINGULAIR ) 10 MG TABS Take 10 mg by mouth daily. 05/22/22  HISTORICAL MED  erythromycin  (ROMYCIN) 5 MG/GM OINT Place 1 inch into left eye 4 (four) times  daily as needed. 03/23/17   Manning, Megan Beatrice, PA-C  metFORMIN  (GLUCOPHAGE ) 500 MG TABS Take 500 mg by mouth 2 (two) times daily   with  meals.    HISTORICAL MED  losartan  (COZAAR ) 25 MG TABS Take 25 mg by mouth daily.    HISTORICAL MED  simvastatin  (ZOCOR ) 40 MG TABS Take 40 mg by mouth at bedtime.    HISTORICAL   MED  levothyroxine  (SYNTHROID , LEVOTHROID) 50 MCG TABS Take 50 mcg by mouth daily.  HISTORICAL MED  Albuterol  Sulfate (PROAIR  HFA) 108 (90 BASE) MCG/ACT IN AERS 2 puffs q 6 hours  as needed 04/19/10   Mardy Hubert LABOR, MD  Montelukast  Sodium (SINGULAIR ) 10 MG PO TABS 1 TABLET EVERY EVENING 10/09/09  Mardy Hubert LABOR, MD  Allergies  Allergen Reactions  ?? Lisinopril Angioedema  ?? Semaglutide  Hives  ?? Peanut Oil Swelling    THROAT ITCHES  THROAT ITCHES   Walnuts  No family history on file.  Social History  Social History  Tobacco Use  ?? Smoking status: Never  ?? Smokeless tobacco: Never  Vaping Use  ?? Vaping status: Never Used  Substance Use Topics  ?? Alcohol use: No  ?? Drug use: No  Review of Systems  Constitutional:  Negative for activity change, diaphoresis, fatigue and  unexpected weight change.  HENT:  Negative for congestion, ear pain, facial swelling, rhinorrhea, sinus  pressure and sore throat.  Eyes:  Negative for pain, redness and itching.  Respiratory:  Negative for cough, chest tightness and shortness of breath.  Cardiovascular:  Negative for chest pain and leg swelling.  Gastrointestinal:  Negative for abdominal pain, diarrhea, nausea and vomiting.  Endocrine: Negative for polydipsia and polyphagia.  Genitourinary:  Negative for decreased urine volume, dysuria, flank pain and  frequency.  Musculoskeletal:   Negative for back pain, gait problem, myalgias, neck pain   and  neck stiffness.  Skin:  Negative for color change and pallor.  Neurological:  Positive for headaches. Negative for dizziness, syncope,  weakness, light-headedness and numbness.  Psychiatric/Behavioral:  Negative for agitation, confusion and suicidal ideas.  The patient is not nervous/anxious.  All other systems reviewed and are negative.  Physical Exam  ED Triage Vitals  Encounter Vitals Group     BP 03/10/24 2308 (!) 155/85     Girls Systolic BP Percentile --     Girls Diastolic BP Percentile --     Boys Systolic BP Percentile --     Boys Diastolic BP Percentile --     Pulse 03/10/24 2307 104     Resp 03/10/24 2307 18     Temp 03/10/24 2307 98.4  F (36.9  C)     Temp src 03/10/24 2307 TEMPORAL     SpO2 03/10/24 2307 100 %     Weight 03/10/24 2304  186 lb (84.4 kg)     Height 03/10/24 2304 5' 4 (1.626 m)     Head Circumference --     Peak Flow --     Pain Score --     Pain Loc --     Pain Education --     Exclude from Growth Chart --  Physical Exam  Vitals and nursing note reviewed.  Constitutional:     Appearance: Normal appearance. She is well-developed.  HENT:     Head: Normocephalic and atraumatic. No raccoon eyes or Battle's sign.     Right Ear: Hearing and external ear normal.     Left Ear: Hearing and external ear normal.  Eyes:     Conjunctiva/sclera: Conjunctivae normal.     Pupils: Pupils are equal, round, and reactive to light.  Neck:     Vascular: No carotid bruit.     Trachea: Trachea and phonation normal.  Cardiovascular:     Rate and Rhythm: Normal rate and regular rhythm.     Pulses: Normal pulses.     Heart sounds: Normal heart sounds.  Pulmonary:     Effort: Pulmonary effort is normal.     Breath sounds: Normal breath sounds. No wheezing, rhonchi or rales.  Abdominal:     General: Bowel sounds are normal. There is no distension.     Palpations: Abdomen is soft. Abdomen is not rigid.     Tenderness: There is no abdominal tenderness.  There is no guarding or  rebound.  Musculoskeletal:     General: Normal range of motion.     Cervical back: Normal range of motion and neck supple. No edema or   erythema.  Pain with movement present.  Skin:     General: Skin is warm.  Neurological:     Mental Status: She is alert and oriented to person, place, and time.     Cranial Nerves: No cranial nerve deficit.     Sensory: No sensory deficit.     Motor: No abnormal muscle tone.     Coordination: Coordination normal.     Deep Tendon Reflexes: Reflexes are normal and symmetric. Reflexes normal.  Psychiatric:     Speech: Speech normal.     Behavior: Behavior normal.     Thought Content: Thought content normal.     Judgment: Judgment normal.  ED Course  Procedures  MDM  Number of Diagnoses or Management Options  Acute strain of neck muscle, initial encounter  Tension headache  Diagnosis management comments:  Differential diagnoses: Tension headache, muscle strain, cervical spine  radiculopathy  MDM: 38 year old female presents the emergency department with complaints of  headache and pain to the left side of the paraspinal musculature at the  insertion point of the skull.  Patient denies any known injury or trauma to   the  area.  Patient did have some point tenderness palpation of the musculature.    No  bony tenderness palpation.  No decreased range of motion..  No overlying  erythema, ecchymosis.  CT head was obtained and revealed no acute intracranial  abnormality.  Patient was given Reglan, Benadryl , Decadron .  Patient noted  moderate improvement of symptoms.  After CT imaging was obtained.  Patient was  given Toradol .  Patient noted significant improvement of symptoms.  Patient   was  prescribed muscle relaxers in the outpatient setting.  Patient is otherwise  stable and appropriate for further management in the outpatient setting.  Discussed return cautions.  Patient expressed understanding.  Patient be  discharged home at this time.  External medical  record review: Please see HPI  Pertinent Labs   for details)  CT HEAD WO CONTRAST  Aiyden Lauderback Date: 03/10/2024  HISTORY:   Headache, sudden, severe Headache; Neck Pain COMPARISON:  None  TECHNIQUE:  Noncontrast multiplanar CT images of the head NOTE:  If there are  questions about the content of this report, please contact [facility] radiology  by calling [phone] FINDINGS: BRAIN PARENCHYMA: Unremarkable. No  intraparenchymal hemorrhage or mass. No acute loss of gray-white matter  differentiation. VENTRICLES/SULCI: Unremarkable.  No hydrocephalus or midline  shift EXTRA-AXIAL SPACES:  Unremarkable PARANASAL SINUSES/MASTOIDS:   Unremarkable  BONES:  Unremarkable OTHER: None  No acute intracranial abnormality.  The patient was given the following medication in the Emergency department:  Medication Administration from 03/10/2024 2301 to 03/11/2024 0154   Date/Time Order Dose Route Action Action by Comments   03/10/2024 2349 EST sodium chloride  0.9 % IV bolus 1,000 mL 1,000 mL  Intravenous New Bag Philamena Kramar --   03/11/2024 0004 EST sodium chloride  0.9 % IV bolus 1,000 mL 0 mL  Intravenous Stopped [place] Oborn --   03/10/2024 2351 EST metoclopramide (REGLAN) injection 10 mg 10 mg  Intravenous Given Rimsha Trembley --   03/10/2024 2350 EST dexamethasone  (DECADRON ) injection 10 mg 10 mg  Intravenous Given Annette Bertelson --   03/10/2024 2351 EST diphenhydrAMINE  (BENADRYL ) injection 25 mg 25 mg  Intravenous Given Arlette Schaad --   03/11/2024 0134 EST ketorolac  (TORADOL ) injection 30 mg 30 mg Intravenous  Given Dorn Pouch --  The patient now feels better.  Medical and Social Comorbidity Risk:  Medical Comorbidity Complication Risk:  None  Concomitant Social Determinants of Health Addressed:  None  Final Diagnosis:  Acute strain of neck muscle, initial encounter  (primary encounter diagnosis)  Tension headache  Patient was given the signs and symptoms of worsening condition and told to  return if  they occurred.  Patient understands the plan and management and all  questions were answered.  Patient will follow up with her primary care   physician  in the next 2-3 days.  Patient was instructed on Culture results and   preliminary  radiology reads and the she would be contacted if a variance or positive test   is  obtained.  The nursing note was reviewed, agreed and appreciated.  Final Diagnosis:  Acute strain of neck muscle, initial encounter  (primary encounter diagnosis)  Tension headache  The patient was given the following medications to go home with:  Medication List  START taking these medications  methocarbamol  750 MG Tabs  Commonly known as: ROBAXIN   Take 1 tablet by mouth 3 (three) times daily as needed.  ASK your doctor about these medications  erythromycin  0.5 % ophthalmic ointment  Place 1 inch into left eye 4 (four) times daily as needed.  ferrous sulfate  325 (65 FE) MG Tabs  fluconazole  150 mg tablet  Commonly known as: Diflucan   Take 1 tablet by mouth every third day as needed for up to 2 doses.  insulin  lispro 100 Units/mL Soln subcutaneous injection  Commonly known as: HUMALOG   Levemir  FlexPen 100 UNIT/ML injection pen  Generic drug: insulin  detemir  levothyroxine  50 MCG Tabs  Commonly known as: SYNTHROID , LEVOTHROID  losartan  25 MG Tabs  Commonly known as: COZAAR   metFORMIN  500 MG Tabs  Commonly known as: GLUCOPHAGE   ProAir  HFA 108 (90 Base) MCG/ACT inhaler  Generic drug: albuterol   2 puffs q 6  hours as needed  simvastatin  40 MG Tabs  Commonly known as: ZOCOR   * Singulair  10 MG Tabs  Generic drug: montelukast   1 TABLET EVERY EVENING  * montelukast  10 MG Tabs  Commonly known as: SINGULAIR    * This list has 2 medication(s) that are the same as other medications  prescribed for you. Read the directions carefully, and ask your doctor or  other care provider to review them with you.  Where to Get Your Medications  These medications were sent to [facility] [id number] [place], [place] -  [address]  [address] [place] [facility] [address]   Phone: [phone]  methocarbamol  750 MG Tabs  Follow-up Information   Follow up With Specialties Details Why Contact Info   [facility]  Call  To schedule a follow up appointment  Multiple Locations Call For An Office Near You.  [place]   [phone]  Dr. Milissa evaluated the patient face-to-face and agrees with the assessment   and  plan.  Please note that some or all of this record was generated using voice  recognition software. If there are any questions about the content of this  document, please contact the author as some errors in transcription may have  occurred.  Amount and/or Complexity of Data Reviewed  Tests in the radiology section of CPT??: reviewed  Alray Tinnie Fuller, PA-C  03/14/24 9773  Milissa Juliene HERO., MD  03/15/24 1400  "

## 2024-03-10 NOTE — ED Provider Notes (Signed)
 "Formatting of this note is different from the original.  Images from the original note were not included.  [facility] EMERGENCY DEPARTMENT  ED PATIENT ENCOUNTER ARRIVAL: 03/10/24 2301  CSN: [id number]     HAR: [id number]    History     Chief Complaint   Patient presents with    Headache    Neck Pain     Patient arrived to ED with c/o left sided neck pain and headache since sept. Denies injury. Denies fever and chills. Alert and oriented x4. Steady gait in triage     38 year old female presents the emergency department with complaints of headache.  Patient reports that it starts in the back left side of her scalp near the insertion of her neck and radiates up around her head.  Patient denies blurred vision, double vision, aphasia, ataxia.  Patient denies photophobia, phonophobia.  Patient reports the symptoms been on and off since September.  Patient denies any injury or trauma to the area.  Patient denies fever, chills.  Patient denies nausea, vomiting.  Patient denies any other complaints.    External medical record review: Office visit with OB/GYN on 11/26/2023.    Past Medical History:   Diagnosis Date    Abnormal uterine bleeding (AUB)     Asthma     Diabetes mellitus type 2, controlled, without complications (HCC)     Hx of dilation and curettage     Hypothyroidism     Infertility, female     Iron  deficiency anemia     Menorrhagia with regular cycle     Trichomonas infection     Uterine leiomyoma      Past Surgical History:   Procedure Laterality Date    BELPHAROPTOSIS REPAIR Left     HC LAPAROSCOPIC MYOMECTOMY      HYSTEROSCOPY W/ POLYPECTOMY       Prior to Admission medications   Medication Sig Start Date End Date Taking? Authorizing Provider   fluconazole  (DIFLUCAN ) 150 mg tablet Take 1 tablet by mouth every third day as needed for up to 2 doses. 03/31/23   Jenette Augustin LABOR., MD   ferrous sulfate  325 (65 FE) MG TABS Take 325 mg by mouth. 08/06/22   HISTORICAL MED   LEVEMIR  FLEXPEN 100 UNIT/ML SOPN pen 40  Units. 05/22/22   HISTORICAL MED   insulin  lispro (HUMALOG ) 100 Units/mL SOLN subcutaneous injection Inject  under the skin.    HISTORICAL MED   montelukast  (SINGULAIR ) 10 MG TABS Take 10 mg by mouth daily. 05/22/22   HISTORICAL MED   erythromycin  (ROMYCIN) 5 MG/GM OINT Place 1 inch into left eye 4 (four) times daily as needed. 03/23/17   Manning, Megan Beatrice, PA-C   metFORMIN  (GLUCOPHAGE ) 500 MG TABS Take 500 mg by mouth 2 (two) times daily with meals.    HISTORICAL MED   losartan  (COZAAR ) 25 MG TABS Take 25 mg by mouth daily.    HISTORICAL MED   simvastatin  (ZOCOR ) 40 MG TABS Take 40 mg by mouth at bedtime.    HISTORICAL MED   levothyroxine  (SYNTHROID , LEVOTHROID) 50 MCG TABS Take 50 mcg by mouth daily.    HISTORICAL MED   Albuterol  Sulfate (PROAIR  HFA) 108 (90 BASE) MCG/ACT IN AERS 2 puffs q 6 hours as needed 04/19/10   Mardy Hubert LABOR, MD   Montelukast  Sodium (SINGULAIR ) 10 MG PO TABS 1 TABLET EVERY EVENING 10/09/09   Mardy Hubert LABOR, MD     Allergies   Allergen Reactions  Lisinopril Angioedema    Semaglutide  Hives    Peanut Oil Swelling     THROAT ITCHES    THROAT ITCHES   Walnuts     No family history on file.    Social History[1]    Review of Systems   Constitutional:  Negative for activity change, diaphoresis, fatigue and unexpected weight change.   HENT:  Negative for congestion, ear pain, facial swelling, rhinorrhea, sinus pressure and sore throat.    Eyes:  Negative for pain, redness and itching.   Respiratory:  Negative for cough, chest tightness and shortness of breath.    Cardiovascular:  Negative for chest pain and leg swelling.   Gastrointestinal:  Negative for abdominal pain, diarrhea, nausea and vomiting.   Endocrine: Negative for polydipsia and polyphagia.   Genitourinary:  Negative for decreased urine volume, dysuria, flank pain and frequency.   Musculoskeletal:  Negative for back pain, gait problem, myalgias, neck pain and neck stiffness.   Skin:  Negative for color change and pallor.    Neurological:  Positive for headaches. Negative for dizziness, syncope, weakness, light-headedness and numbness.   Psychiatric/Behavioral:  Negative for agitation, confusion and suicidal ideas. The patient is not nervous/anxious.    All other systems reviewed and are negative.    Physical Exam     ED Triage Vitals   Encounter Vitals Group      BP 03/10/24 2308 (!) 155/85      Girls Systolic BP Percentile --       Girls Diastolic BP Percentile --       Boys Systolic BP Percentile --       Boys Diastolic BP Percentile --       Pulse 03/10/24 2307 104      Resp 03/10/24 2307 18      Temp 03/10/24 2307 98.4 F (36.9 C)      Temp src 03/10/24 2307 TEMPORAL      SpO2 03/10/24 2307 100 %      Weight 03/10/24 2304 186 lb (84.4 kg)      Height 03/10/24 2304 5' 4 (1.626 m)      Head Circumference --       Peak Flow --       Pain Score --       Pain Loc --       Pain Education --       Exclude from Growth Chart --      Physical Exam  Vitals and nursing note reviewed.   Constitutional:       Appearance: Normal appearance. She is well-developed.   HENT:      Head: Normocephalic and atraumatic. No raccoon eyes or Battle's sign.      Right Ear: Hearing and external ear normal.      Left Ear: Hearing and external ear normal.   Eyes:      Conjunctiva/sclera: Conjunctivae normal.      Pupils: Pupils are equal, round, and reactive to light.   Neck:      Vascular: No carotid bruit.      Trachea: Trachea and phonation normal.     Cardiovascular:      Rate and Rhythm: Normal rate and regular rhythm.      Pulses: Normal pulses.      Heart sounds: Normal heart sounds.   Pulmonary:      Effort: Pulmonary effort is normal.      Breath sounds: Normal breath sounds. No wheezing, rhonchi or rales.   Abdominal:  General: Bowel sounds are normal. There is no distension.      Palpations: Abdomen is soft. Abdomen is not rigid.      Tenderness: There is no abdominal tenderness. There is no guarding or rebound.   Musculoskeletal:          General: Normal range of motion.      Cervical back: Normal range of motion and neck supple. No edema or erythema. Pain with movement present.   Skin:     General: Skin is warm.   Neurological:      Mental Status: She is alert and oriented to person, place, and time.      Cranial Nerves: No cranial nerve deficit.      Sensory: No sensory deficit.      Motor: No abnormal muscle tone.      Coordination: Coordination normal.      Deep Tendon Reflexes: Reflexes are normal and symmetric. Reflexes normal.   Psychiatric:         Speech: Speech normal.         Behavior: Behavior normal.         Thought Content: Thought content normal.         Judgment: Judgment normal.     ED Course       Procedures    MDM  Number of Diagnoses or Management Options  Acute strain of neck muscle, initial encounter  Tension headache  Diagnosis management comments:     Differential diagnoses: Tension headache, muscle strain, cervical spine radiculopathy    MDM: 38 year old female presents the emergency department with complaints of headache and pain to the left side of the paraspinal musculature at the insertion point of the skull.  Patient denies any known injury or trauma to the area.  Patient did have some point tenderness palpation of the musculature.  No bony tenderness palpation.  No decreased range of motion..  No overlying erythema, ecchymosis.  CT head was obtained and revealed no acute intracranial abnormality.  Patient was given Reglan, Benadryl , Decadron .  Patient noted moderate improvement of symptoms.  After CT imaging was obtained.  Patient was given Toradol .  Patient noted significant improvement of symptoms.  Patient was prescribed muscle relaxers in the outpatient setting.  Patient is otherwise stable and appropriate for further management in the outpatient setting.  Discussed return cautions.  Patient expressed understanding.  Patient be discharged home at this time.    External medical record review: Please see  HPI    Pertinent Labs & Imaging studies reviewed and interpreted by myself. (See chart for details)  CT HEAD WO CONTRAST  Result Date: 03/10/2024  HISTORY:   Headache, sudden, severe Headache; Neck Pain COMPARISON:  None TECHNIQUE:  Noncontrast multiplanar CT images of the head NOTE:  If there are questions about the content of this report, please contact [facility] radiology by calling [phone] FINDINGS: BRAIN PARENCHYMA: Unremarkable. No intraparenchymal hemorrhage or mass. No acute loss of gray-white matter differentiation. VENTRICLES/SULCI: Unremarkable.  No hydrocephalus or midline shift EXTRA-AXIAL SPACES:  Unremarkable PARANASAL SINUSES/MASTOIDS: Unremarkable BONES:  Unremarkable OTHER: None     No acute intracranial abnormality.     The patient was given the following medication in the Emergency department:    Medication Administration from 03/10/2024 2301 to 03/11/2024 0154     Date/Time Order Dose Route Action Action by Comments    03/10/2024 2349 EST sodium chloride  0.9 % IV bolus 1,000 mL 1,000 mL   Intravenous New Bag Dorn Pouch --  03/11/2024 0004 EST sodium chloride  0.9 % IV bolus 1,000 mL 0 mL   Intravenous Stopped Shellie Rogoff --    03/10/2024 2351 EST metoclopramide (REGLAN) injection 10 mg 10 mg   Intravenous Given Koralee Wedeking --    03/10/2024 2350 EST dexamethasone  (DECADRON ) injection 10 mg 10 mg   Intravenous Given Sameria Morss --    03/10/2024 2351 EST diphenhydrAMINE  (BENADRYL ) injection 25 mg 25 mg   Intravenous Given Melika Reder --    03/11/2024 0134 EST ketorolac  (TORADOL ) injection 30 mg 30 mg Intravenous   Given Dorn Pouch --           The patient now feels better.    Medical and Social Comorbidity Risk:    Medical Comorbidity Complication Risk:  None    Concomitant Social Determinants of Health Addressed:  None     Final Diagnosis:    Acute strain of neck muscle, initial encounter  (primary encounter diagnosis)  Tension headache    Patient was  given the signs and symptoms of worsening condition and told to return if they occurred.  Patient understands the plan and management and all questions were answered.  Patient will follow up with her primary care physician in the next 2-3 days.  Patient was instructed on Culture results and preliminary radiology reads and the she would be contacted if a variance or positive test is obtained.  The nursing note was reviewed, agreed and appreciated.      Final Diagnosis:    Acute strain of neck muscle, initial encounter  (primary encounter diagnosis)  Tension headache    The patient was given the following medications to go home with:      Medication List     START taking these medications    methocarbamol  750 MG Tabs  Commonly known as: ROBAXIN   Take 1 tablet by mouth 3 (three) times daily as needed.      ASK your doctor about these medications    erythromycin  0.5 % ophthalmic ointment  Place 1 inch into left eye 4 (four) times daily as needed.    ferrous sulfate  325 (65 FE) MG Tabs    fluconazole  150 mg tablet  Commonly known as: Diflucan   Take 1 tablet by mouth every third day as needed for up to 2 doses.    insulin  lispro 100 Units/mL Soln subcutaneous injection  Commonly known as: HUMALOG     Levemir  FlexPen 100 UNIT/ML injection pen  Generic drug: insulin  detemir    levothyroxine  50 MCG Tabs  Commonly known as: SYNTHROID , LEVOTHROID    losartan  25 MG Tabs  Commonly known as: COZAAR     metFORMIN  500 MG Tabs  Commonly known as: GLUCOPHAGE     ProAir  HFA 108 (90 Base) MCG/ACT inhaler  Generic drug: albuterol   2 puffs q 6 hours as needed    simvastatin  40 MG Tabs  Commonly known as: ZOCOR     * Singulair  10 MG Tabs  Generic drug: montelukast   1 TABLET EVERY EVENING    * montelukast  10 MG Tabs  Commonly known as: SINGULAIR        * This list has 2 medication(s) that are the same as other medications   prescribed for you. Read the directions carefully, and ask your doctor or   other care provider to review them with you.            Where to Get Your Medications     These medications were sent to [facility] [phone] - [place], [place] -  [address] AT [facility] [address]  [address] [place] [address]    Phone: [phone]   methocarbamol  750 MG Tabs      Follow-up Information     Follow up With Specialties Details Why Contact Info    [facility]  Call  To schedule a follow up appointment   Multiple Locations Call For An Office Near You.  [place]    [phone]       Dr. Milissa evaluated the patient face-to-face and agrees with the assessment and plan.    Please note that some or all of this record was generated using voice recognition software. If there are any questions about the content of this document, please contact the author as some errors in transcription may have occurred.      Amount and/or Complexity of Data Reviewed  Tests in the radiology section of CPT: reviewed        [1]   Social History  Tobacco Use    Smoking status: Never    Smokeless tobacco: Never   Vaping Use    Vaping status: Never Used   Substance Use Topics    Alcohol use: No    Drug use: No       Alray Tinnie Fuller, PA-C  03/14/24 0226      Milissa Juliene HERO., MD  03/15/24 1400    Cosigned by Milissa Juliene HERO., MD at 03/15/2024  2:00 PM EST  Electronically signed by Alray Tinnie Fuller, PA-C at 03/14/2024  2:26 AM EST  Electronically signed by Milissa Juliene HERO., MD at 03/15/2024  2:00 PM EST    Associated attestation - Milissa Juliene HERO., MD - 03/15/2024  2:00 PM EST  Formatting of this note is different from the original.  EMERGENCY DEPARTMENT - ATTENDING NOTE        I have personally seen and examined this patient. I have fully participated in the care of this patient with the resident/APP. I have performed the substantive portion of the medical decision making.  I have reviewed and agree with all pertinent clinical information including history, physical exam, labs, radiographic studies, and the plan.  I approve  the management plan and take responsibility for that plan and management.    IJuliene Milissa, MD, have personally obtained a history and examined the patient. They present for left sided neck tightness/soreness and headache. Their pertinent exam findings are discussed in MDM below .    Diagnostic, treatment, and disposition decisions were made by myself in conjunction with the resident or advanced practice provider.  I have also reviewed and agree with the HPI, exam, past medical, family and social history unless otherwise noted.    If appropriate, my independent interpretation of patient's labs/imaging/EKG are interpreted in ED Course below:        MDM  38y F p/w L neck spasm and headache without red flag symptoms. Hemodynamically stable, afebrile, SIRS negative. No meningismus on exam. Neurologically intact. CT negative. Symptoms well controlled on reassessment after migraine cocktail. Discussed strict return precautions with patient who verbalized understanding and was amenable to dc home. Stable for discharge.    1. Acute strain of neck muscle, initial encounter    2. Tension headache      For all further details of the patient's emergency department visit, please see the resident or advanced practice provider's documentation.    Electronically signed by Juliene HERO. Milissa, MD        Electronically signed  by:   Juliene HERO. Milissa, MD  "

## 2024-03-10 NOTE — ED Triage Notes (Signed)
"  Formatting of this note might be different from the original.  Triage Note  Shelly Richard presents to [facility] with complaint(s) of Headache and Neck Pain (Patient arrived to ED with c/o left sided neck pain and headache since sept. Denies injury. Denies fever and chills. Alert and oriented x4. Steady gait in triage)      Electronically signed by Glendia Morris, Registered Nurse at 03/10/2024 11:08 PM EST  "

## 2024-03-10 NOTE — ED Notes (Signed)
"  Formatting of this note might be different from the original.  Bed: 03  Expected date:   Expected time:   Means of arrival:   Comments:  TR  Electronically signed by Gladis Cowper, Registered Nurse at 03/10/2024 11:08 PM EST  "

## 2024-03-10 NOTE — HIE Documentation (Unsigned)
"      Transcription Designer, Multimedia Text  CURRENT STATUS IS EMERGENCY .   :  Any questions regarding PATIENT CLASS/STATUS should be directed to the Care  Management Departments:  [facility] [phone] or [facility] [phone]   or  [facility]  [phone].  "

## 2024-03-11 NOTE — Progress Notes (Signed)
"  Formatting of this note might be different from the original.  CURRENT STATUS IS EMERGENCY .     :    Any questions regarding PATIENT CLASS/STATUS should be directed to the Care Management Departments:  [facility] [phone] or [facility] [phone] or [facility]  [phone].  Electronically signed by Audrea Moat, MD at 03/11/2024  9:48 AM EST  "

## 2024-03-14 NOTE — ED Triage Notes (Signed)
"  Formatting of this note might be different from the original.  Triage Note  Shelly Richard presents to [facility] with complaint(s) of Neck Pain (Patient to ED with complaint of left sided neck pain and headache. States seen here recently for same, sent home with muscle relaxer but no relief. Denies injury. Oriented x 4, respirations even and unlabored. )      Electronically signed by Terresa Ditch, Registered Nurse at 03/14/2024  1:46 PM EST  "

## 2024-03-14 NOTE — Progress Notes (Signed)
 Formatting of this note might be different from the original.  CURRENT STATUS IS EMERGENCY .     :    Any questions regarding PATIENT CLASS/STATUS should be directed to the Care Management Departments:  [facility] [phone] or [facility] [phone] or [facility]  [phone].  Electronically signed by Audrea Moat, MD at 03/15/2024 11:20 AM EST

## 2024-03-14 NOTE — ED Provider Notes (Signed)
 "Formatting of this note is different from the original.  Images from the original note were not included.  EMERGENCY DEPARTMENT NOTE    CHIEF COMPLAINT    Chief Complaint   Patient presents with    Neck Pain     Patient to ED with complaint of left sided neck pain and headache. States seen here recently for same, sent home with muscle relaxer but no relief. Denies injury. Oriented x 4, respirations even and unlabored.      HPI    Shelly Richard is a 38 year old female who presents with a headache.  Patient reports that she was recently seen in the emergency department for a headache.  She actually is feeling a lot better after prior ED visit but has now had recurrent symptoms.  She states that it is over the left posterior head and radiating up into the left forehead.  No history of headaches.  She was discharged home with Robaxin .  She has tried that, without relief of her symptoms.  No numbness or tingling.  She does report some nausea but no vomiting.  She does report photophobia.  No slurred speech facial droop focal weakness numbness or tingling.  No confusion.    PAST MEDICAL HISTORY    Past Medical History:   Diagnosis Date    Abnormal uterine bleeding (AUB)     Asthma     Diabetes mellitus type 2, controlled, without complications (HCC)     Hx of dilation and curettage     Hypothyroidism     Infertility, female     Iron  deficiency anemia     Menorrhagia with regular cycle     Trichomonas infection     Uterine leiomyoma      SURGICAL HISTORY    Past Surgical History:   Procedure Laterality Date    BELPHAROPTOSIS REPAIR Left     HC LAPAROSCOPIC MYOMECTOMY      HYSTEROSCOPY W/ POLYPECTOMY       CURRENT MEDICATIONS    Prior to Admission medications   Medication Sig Start Date End Date Taking? Authorizing Provider   methocarbamol  (ROBAXIN ) 750 MG TABS Take 1 tablet by mouth 3 (three) times daily as needed. 03/11/24   Alray Tinnie Fuller, PA-C   fluconazole  (DIFLUCAN ) 150 mg tablet Take 1 tablet by mouth every  third day as needed for up to 2 doses. 03/31/23   Jenette Augustin LABOR., MD   ferrous sulfate  325 (65 FE) MG TABS Take 325 mg by mouth. 08/06/22   HISTORICAL MED   LEVEMIR  FLEXPEN 100 UNIT/ML SOPN pen 40 Units. 05/22/22   HISTORICAL MED   insulin  lispro (HUMALOG ) 100 Units/mL SOLN subcutaneous injection Inject  under the skin.    HISTORICAL MED   montelukast  (SINGULAIR ) 10 MG TABS Take 10 mg by mouth daily. 05/22/22   HISTORICAL MED   erythromycin  (ROMYCIN) 5 MG/GM OINT Place 1 inch into left eye 4 (four) times daily as needed. 03/23/17   Manning, Megan Beatrice, PA-C   metFORMIN  (GLUCOPHAGE ) 500 MG TABS Take 500 mg by mouth 2 (two) times daily with meals.    HISTORICAL MED   losartan  (COZAAR ) 25 MG TABS Take 25 mg by mouth daily.    HISTORICAL MED   simvastatin  (ZOCOR ) 40 MG TABS Take 40 mg by mouth at bedtime.    HISTORICAL MED   levothyroxine  (SYNTHROID , LEVOTHROID) 50 MCG TABS Take 50 mcg by mouth daily.    HISTORICAL MED   Albuterol  Sulfate (PROAIR  HFA)  108 (90 BASE) MCG/ACT IN AERS 2 puffs q 6 hours as needed 04/19/10   Mardy Hubert LABOR, MD   Montelukast  Sodium (SINGULAIR ) 10 MG PO TABS 1 TABLET EVERY EVENING 10/09/09   Mardy Hubert LABOR, MD     ALLERGIES    Allergies   Allergen Reactions    Lisinopril Angioedema    Semaglutide  Hives    Peanut Oil Swelling     THROAT ITCHES    THROAT ITCHES   Walnuts     FAMILY HISTORY    History reviewed. No pertinent family history.    SOCIAL HISTORY    Social History[1]    REVIEW OF SYSTEMS    Constitutional: - fever, - chills  HENT: - sore throat, - congestion  Respiratory: - shortness of breath, - cough  Cardiovascular: - chest pain  Gastrointestinal: - abdominal pain, + nausea, - vomiting  Musculoskeletal: - back pain  Skin: - rash  Neurological: - focal weakness  All systems negative except as marked.     PHYSICAL EXAM    VITAL SIGNS: BP 131/75 (Patient Position: Semi-Fowlers)   Pulse 81   Temp 98.1 F (36.7 C) (Oral)   Resp 20   Ht 64 (162.6 cm)   Wt 84.4 kg (186 lb)    LMP 02/28/2024 (Exact Date)   SpO2 100%   BMI 31.93 kg/m    Constitutional: Well developed, well nourished, no acute distress  HENT:  Normocephalic, atraumatic, bilateral external ears normal, oropharynx moist, nose normal  Neck: Supple, normal ROM, no nuchal rigidity or meningismus, no midline bony spinous tenderness to palpation, no paraspinal tenderness to palpation  Eyes:  PERRL, EOMI, conjunctiva normal  Respiratory:  No respiratory distress, normal breath sounds, no wheezing  Cardiovascular:  Regular rate and rhythm, no murmurs  GI: Soft, non-distended, no tenderness to palpation   GU: Deferred  Musculoskeletal:  Intact distal pulses, good ROM of all major joints  Back: No midline bony spinous tenderness to palpation  Integument:  Warm, dry, no rash  Neurologic:  Alert & oriented, no focal neurologic deficits, normal speech, CN II through XII intact, normal motor function, normal sensory function  Psychiatric:  Normal mood, normal affect normal, normal judgement    LABORATORY  No results found for this or any previous visit (from the past 24 hours).    RADIOLOGY    XR CERVICAL SPINE AP LATERAL AND ODONTOID   Final Result       No radiographic evidence of acute displaced fracture.                              ED COURSE & MEDICAL DECISION MAKING      Initial impression:     Briefly, Shelly Richard is a 23 year oldfemale with chief concern of   Chief Complaint   Patient presents with    Neck Pain     Patient to ED with complaint of left sided neck pain and headache. States seen here recently for same, sent home with muscle relaxer but no relief. Denies injury. Oriented x 4, respirations even and unlabored.      History obtained from independent historian: Independent source to provide history: The patient was able to provide the full history and participate in the medical decision making    External records review:  Patient was initially seen in the emergency department on 05/11/2023 with a headache.  She  had a CT at that  time that was negative.  She was treated with a migraine cocktail and was discharged home on Robaxin .    Initial assessment and rationale for testing ordered: Patient is seen and examined in the emergency department.  She is generally well-appearing.  She is hemodynamically stable.  She is presenting with a headache.  She just recently had a ED visit for similar symptoms.  Her symptoms improved and now have recurred.  She has no nuchal rigidity or meningismus.  Low suspicion for meningitis.  This does seem consistent with a migraine headache.  She already had a CT head at her last ED visit so I do not think we need to repeat that but I would like to get it x-ray of the cervical spine to evaluate for possible cervical radiculopathy as the cause of the patient's symptoms.  I would like to treat her with a migraine cocktail and reevaluation.    Differential diagnosis (includes but is not limited to) (3:47 PM): Migraine headache, tension headache, cluster headache, temporal arteritis, acute angle-closure glaucoma, SAH, ICH, CVA, IIH, venous sinus thrombosis, meningitis    Work up and therapy ordered during this encounter:     Imaging performed and reviewed with interpretation by radiology, reviewed by myself:  X-ray cervical spine negative for acute pathology.    The patient was given the following medication in the Emergency Department:  Medication Administration from 03/14/2024 1339 to 03/14/2024 1547       Date/Time Order Dose Route Action Action by Comments    03/14/2024 1416 EST sodium chloride  0.9 % IV bolus 1,000 mL 1,000 mL Intravenous New Bag Leita Bloch --    03/14/2024 1411 EST acetaminophen  (TYLENOL ) tablet 1,000 mg 1,000 mg Oral Given Leita Bloch --    03/14/2024 1411 EST metoclopramide (REGLAN) injection 10 mg 10 mg Intravenous Given Leita Bloch --    03/14/2024 1411 EST diphenhydrAMINE  (BENADRYL ) injection 25 mg 25 mg Intravenous Given Leita Bloch --    03/14/2024 1530 EST ketorolac   (TORADOL ) injection 15 mg 15 mg Intravenous Given Leita Bloch --         Patients ED Course:     Vitals:    03/14/24 1345   BP: 131/75   Patient Position: Semi-Fowlers   Pulse: 81   Resp: 20   Temp: 98.1 F (36.7 C)   TempSrc: Oral   SpO2: 100%   Weight: 84.4 kg (186 lb)   Height: 64 (162.6 cm)     The patient is presenting with a headache.    Patient's headache could be possibly benign tension headache versus cervicogenic migraine headache.    No congestion, rhinorrhea, or other infectious symptoms, makes sinusitis an unlikely cause of the patient?s headache. Supple neck with full ROM without nuchal rigidity or meningismus, no fever, and no AMS, makes meningitis less likely.  No AMS and BP at patient?s baseline, makes hypertensive headache less likely. Headache that was non-maximal at onset, no history of LOC or seizure activity, no history of trauma, no cancer history, no history of recent systemic bacterial infection, and non-focal neurologic exam makes acute intracranial process and intracranial mass lesion less likely.  Low suspicion for temporal arteritis, trigeminal neuralgia, or cluster headache, given no characteristic symptoms or clinical findings on exam.     The patients symptoms were treated with the above noted medications. On re-evaluation, the patient reports improvement of symptoms. At this point, given the history, exam, and work up, I think it less likely that there is an immediately emergent  cause of the patient's headache. I believe the patient can trial outpatient management with strict return precautions. The patient is comfortable with this plan and all questions were answered.     Comorbidities:   The patient has the following comorbidity that contribute to the increase risk of complications with the present diagnosis and treatment.  I have discussed with the patient the risks and importance of monitoring.  Patient understands and agrees.    Diabetes    Patient final diagnosis and  disposition:     Social determinants of health affecting treatment: None    Prescription Drug / Test  Management : The Patient's current medications were reviewed on today's visit and the following changes were recommended.  Medrol  Dosepak    Patient was given the signs and symptoms of worsening condition and told to return if they occurred.  Patient understands the plan and management and all questions were answered.  Patient will follow up with her primary care physician in the next 2-3 days.  Patient was instructed on Culture results and preliminary radiology reads and the she would be contacted if a variance or positive test is obtained.  The nursing note was reviewed, agreed and appreciated.      Final Diagnosis:    1. Acute nonintractable headache, unspecified headache type      The patient was given the following medications to go home with:      Medication List       START taking these medications      methylPREDNIsolone  4 mg tablet pack  Commonly known as: MEDROL  DOSEPAK  Take 6 tablets by mouth daily for 1 day, THEN 5 tablets daily for 1 day, THEN 4 tablets daily for 1 day, THEN 3 tablets daily for 1 day, THEN 2 tablets daily for 1 day, THEN 1 tablet daily for 1 day. .  Start taking on: March 14, 2024          ASK your doctor about these medications      erythromycin  0.5 % ophthalmic ointment  Place 1 inch into left eye 4 (four) times daily as needed.    ferrous sulfate  325 (65 FE) MG Tabs    fluconazole  150 mg tablet  Commonly known as: Diflucan   Take 1 tablet by mouth every third day as needed for up to 2 doses.    insulin  lispro 100 Units/mL Soln subcutaneous injection  Commonly known as: HUMALOG     Levemir  FlexPen 100 UNIT/ML injection pen  Generic drug: insulin  detemir    levothyroxine  50 MCG Tabs  Commonly known as: SYNTHROID , LEVOTHROID    losartan  25 MG Tabs  Commonly known as: COZAAR     metFORMIN  500 MG Tabs  Commonly known as: GLUCOPHAGE     methocarbamol  750 MG Tabs  Commonly known as:  ROBAXIN   Take 1 tablet by mouth 3 (three) times daily as needed.    ProAir  HFA 108 (90 Base) MCG/ACT inhaler  Generic drug: albuterol   2 puffs q 6 hours as needed    simvastatin  40 MG Tabs  Commonly known as: ZOCOR     * Singulair  10 MG Tabs  Generic drug: montelukast   1 TABLET EVERY EVENING    * montelukast  10 MG Tabs  Commonly known as: SINGULAIR          * This list has 2 medication(s) that are the same as other medications prescribed for you. Read the directions carefully, and ask your doctor or other care provider to review them with you.  Where to Get Your Medications       These medications were sent to [facility] [id number] [facility], [place] - [address] [address] [facility] [address]  [address] [place] [address]      Phone: [phone]   methylPREDNIsolone  4 mg tablet pack      Follow-up Information       Follow up With Specialties Details Why Contact Info    Alexandro Asal, CNP  In 1 week For a recheck [address]  [facility] [address]  [phone]          Please note that some or all of this record was generated using voice recognition software. If there are any questions about the content of this document, please contact the author as some errors in transcription may have occurred.        [1]   Social History  Socioeconomic History    Marital status: Single   Tobacco Use    Smoking status: Never    Smokeless tobacco: Never   Vaping Use    Vaping status: Never Used   Substance and Sexual Activity    Alcohol use: No    Drug use: No    Sexual activity: Yes     Partners: Male   Other Topics Concern    Weight Concern No    Special Diet No    Exercise No    Self-Exams No     Social Drivers of Psychologist, [employer] Strain: Low Risk  (08/27/2022)    Received from [facility]    Overall Financial Resource Strain (CARDIA)     Difficulty of Paying Living Expenses: Not hard at all   Food Insecurity: Patient Declined (11/25/2023)     Received from [facility]    Hunger Vital Sign     Within the past 12 months, you worried that your food would run out before you got the money to buy more.: Patient declined     Within the past 12 months, the food you bought just didn't last and you didn't have money to get more.: Patient declined   Transportation Needs: Patient Declined (11/25/2023)    Received from [facility]    PRAPARE - Transportation     In the past 12 months, has lack of transportation kept you from medical appointments or from getting medications?: Patient declined     In the past 12 months, has lack of transportation kept you from meetings, work, or from getting things needed for daily living?: Patient declined   Housing Stability: Patient Declined (11/25/2023)    Received from [facility]    Housing Stability Vital Sign     In the last 12 months, was there a time when you were not able to pay the mortgage or rent on time?: Patient declined     In the past 12 months, how many times have you moved where you were living?: 0     At any time in the past 12 months, were you homeless or living in a shelter (including now)?: Patient declined       Sharron Ulanda Jansky, MD  03/14/24 1547    Electronically signed by Sharron Ulanda Jansky, MD at 03/14/2024  3:47 PM EST  "

## 2024-03-14 NOTE — HIE Documentation (Unsigned)
"      Transcription Designer, Multimedia Text  CURRENT STATUS IS EMERGENCY .   :  Any questions regarding PATIENT CLASS/STATUS should be directed to the Care  Management Departments:  [facility] [phone] or [facility] [phone]   or  [facility]  [phone].  "

## 2024-03-14 NOTE — HIE Documentation (Unsigned)
 "    Transcription Designer, Multimedia Text  EMERGENCY DEPARTMENT NOTE  CHIEF COMPLAINT  Chief Complaint  Patient presents with  ?? Neck Pain    Patient to ED with complaint of left sided neck pain and headache. States   seen  here recently for same, sent home with muscle relaxer but no relief. Denies  injury. Oriented x 4, respirations even and unlabored.  HPI  Shelly Richard is a 38 year old female who presents with a headache.    Patient  reports that she was recently seen in the emergency department for a headache.  She actually is feeling a lot better after prior ED visit but has now had  recurrent symptoms.  She states that it is over the left posterior head and  radiating up into the left forehead.  No history of headaches.  She was  discharged home with Robaxin .  She has tried that, without relief of her  symptoms.  No numbness or tingling.  She does report some nausea but no  vomiting.  She does report photophobia.  No slurred speech facial droop focal  weakness numbness or tingling.  No confusion.  PAST MEDICAL HISTORY  Past Medical History:  Diagnosis Date  ?? Abnormal uterine bleeding (AUB)  ?? Asthma  ?? Diabetes mellitus type 2, controlled, without complications (HCC)  ?? Hx of dilation and curettage  ?? Hypothyroidism  ?? Infertility, female  ?? Iron  deficiency anemia  ?? Menorrhagia with regular cycle  ?? Trichomonas infection  ?? Uterine leiomyoma  SURGICAL HISTORY  Past Surgical History:  Procedure Laterality Date  ?? BELPHAROPTOSIS REPAIR Left  ?? HC LAPAROSCOPIC MYOMECTOMY  ?? HYSTEROSCOPY W/ POLYPECTOMY  CURRENT MEDICATIONS  Prior to Admission medications  Medication Sig Start Date End Date Taking? Authorizing Provider  methocarbamol  (ROBAXIN ) 750 MG TABS Take 1 tablet by mouth 3 (three) times   daily  as needed. 03/11/24   Alray Tinnie Fuller, PA-C  fluconazole  (DIFLUCAN ) 150 mg tablet Take 1 tablet by mouth every third day as  needed for up to 2 doses. 03/31/23   Jenette Augustin LABOR., MD  ferrous sulfate  325 (65 FE) MG TABS Take 325 mg by mouth. 08/06/22   HISTORICAL  MED  LEVEMIR  FLEXPEN 100 UNIT/ML SOPN pen 40 Units. 05/22/22   HISTORICAL MED  insulin  lispro (HUMALOG ) 100 Units/mL SOLN subcutaneous injection Inject    under  the skin.    HISTORICAL MED  montelukast  (SINGULAIR ) 10 MG TABS Take 10 mg by mouth daily. 05/22/22  HISTORICAL MED  erythromycin  (ROMYCIN) 5 MG/GM OINT Place 1 inch into left eye 4 (four) times  daily as needed. 03/23/17   Manning, Megan Beatrice, PA-C  metFORMIN  (GLUCOPHAGE ) 500 MG TABS Take 500 mg by mouth 2 (two) times daily   with  meals.    HISTORICAL MED  losartan  (COZAAR ) 25 MG TABS Take 25 mg by mouth daily.    HISTORICAL MED  simvastatin  (ZOCOR ) 40 MG TABS Take 40 mg by mouth at bedtime.    HISTORICAL   MED  levothyroxine  (SYNTHROID , LEVOTHROID) 50 MCG TABS Take 50 mcg by mouth daily.  HISTORICAL MED  Albuterol  Sulfate (PROAIR  HFA) 108 (90 BASE) MCG/ACT IN AERS 2 puffs q 6 hours  as needed 04/19/10   Mardy Hubert LABOR, MD  Montelukast  Sodium (SINGULAIR ) 10 MG PO TABS 1 TABLET EVERY EVENING 10/09/09  Mardy Hubert LABOR, MD  ALLERGIES  Allergies  Allergen Reactions  ?? Lisinopril Angioedema  ?? Semaglutide  Hives  ??  Peanut Oil Swelling    THROAT ITCHES  THROAT ITCHES   Walnuts  FAMILY HISTORY  History reviewed. No pertinent family history.  SOCIAL HISTORY  Social History  Social History  Socioeconomic History  ?? Marital status: Single  Tobacco Use  ?? Smoking status: Never  ?? Smokeless tobacco: Never  Vaping Use  ?? Vaping status: Never Used  Substance and Sexual Activity  ?? Alcohol use: No  ?? Drug use: No  ?? Sexual activity: Yes    Partners: Male  Other Topics Concern  ?? Weight Concern No  ?? Special Diet No  ?? Exercise No  ?? Self-Exams No  Social Drivers of Catering Manager Strain: Low Risk  (08/27/2022)   [facility]   Overall Financial Resource Strain (CARDIA)   ?? Difficulty of Paying Living Expenses: Not hard at  all  Food Insecurity: Patient Declined (11/25/2023)   Received from [facility]   Hunger Vital Sign   ?? Within the past 12 months, you worried that your food would run out before  you got the money to buy more.: Patient declined   ?? Within the past 12 months, the food you bought just didn't last and you  didn't have money to get more.: Patient declined  Transportation Needs: Patient Declined (11/25/2023)   Received from [facility]   PRAPARE - Transportation   ?? In the past 12 months, has lack of transportation kept you from medical  appointments or from getting medications?: Patient declined   ?? In the past 12 months, has lack of transportation kept you from meetings,  work, or from getting things needed for daily living?: Patient declined  Housing Stability: Patient Declined (11/25/2023)   Received from [facility]   Housing Stability Vital Sign   ?? In the last 12 months, was there a time when you were not able to pay the  mortgage or rent on time?: Patient declined   ?? In the past 12 months, how many times have you moved where you were   living?:  0   ?? At any time in the past 12 months, were you homeless or living in a   shelter  (including now)?: Patient declined  REVIEW OF SYSTEMS  Constitutional: - fever, - chills  HENT: - sore throat, - congestion  Respiratory: - shortness of breath, - cough  Cardiovascular: - chest pain  Gastrointestinal: - abdominal pain, + nausea, - vomiting  Musculoskeletal: - back pain  Skin: - rash  Neurological: - focal weakness  All systems negative except as marked.  PHYSICAL EXAM  VITAL SIGNS: BP 131/75 (Patient Position: Semi-Fowlers)   Pulse 81   Temp   98.1   F (36.7  C) (Oral)   Resp 20   Ht 64 (162.6 cm)   Wt 84.4 kg (186 lb)    LMP 02/28/2024 (Exact Date)   SpO2 100%   BMI 31.93 kg/m??  Constitutional: Well developed, well nourished, no acute distress  HENT:  Normocephalic, atraumatic, bilateral  external ears normal, oropharynx  moist, nose normal  Neck: Supple, normal ROM, no nuchal rigidity or meningismus, no midline bony  spinous tenderness to palpation, no paraspinal tenderness to palpation  Eyes:  PERRL, EOMI, conjunctiva normal  Respiratory:  No respiratory distress, normal breath sounds, no wheezing  Cardiovascular:  Regular rate and rhythm, no murmurs  GI: Soft, non-distended, no tenderness to palpation  GU: Deferred  Musculoskeletal:  Intact distal pulses, good ROM of all major joints  Back: No midline bony spinous tenderness to palpation  Integument:  Warm, dry, no rash  Neurologic:  Alert   II through XII intact, normal motor function, normal sensory function  Psychiatric:  Normal mood, normal affect normal, normal judgement  LABORATORY  No results found for this or any previous visit (from the past 24 hours).  RADIOLOGY  XR CERVICAL SPINE AP LATERAL AND ODONTOID  Final Nahla Lukin  No radiographic evidence of acute displaced fracture.  ED COURSE   Initial impression:  Briefly, Shelly Richard is a 47 year oldfemale with chief concern of  Chief Complaint  Patient presents with  ?? Neck Pain    Patient to ED with complaint of left sided neck pain and headache. States   seen  here recently for same, sent home with muscle relaxer but no relief. Denies  injury. Oriented x 4, respirations even and unlabored.  History obtained from independent historian: Independent source to provide  history: The patient was able to provide the full history and participate in   the  medical decision making  External records review:  Patient was initially seen in the emergency department on 05/11/2023 with a  headache.  She had a CT at that time that was negative.  She was treated with   a  migraine cocktail and was discharged home on Robaxin .  Initial assessment and rationale for testing ordered: Patient is seen and  examined in the emergency department.  She is generally well-appearing.  She   is  hemodynamically stable.   She is presenting with a headache.  She just recently  had a ED visit for similar symptoms.  Her symptoms improved and now have  recurred.  She has no nuchal rigidity or meningismus.  Low suspicion for  meningitis.  This does seem consistent with a migraine headache.  She already  had a CT head at her last ED visit so I do not think we need to repeat that   but  I would like to get it x-ray of the cervical spine to evaluate for possible  cervical radiculopathy as the cause of the patient's symptoms.  I would like   to  treat her with a migraine cocktail and reevaluation.  Differential diagnosis (includes but is not limited to) (3:47 PM): Migraine  headache, tension headache, cluster headache, temporal arteritis, acute  angle-closure glaucoma, SAH, ICH, CVA, IIH, venous sinus thrombosis,   meningitis  Work up and therapy ordered during this encounter:  Imaging performed and reviewed with interpretation by radiology, reviewed by  myself:  X-ray cervical spine negative for acute pathology.  The patient was given the following medication in the Emergency Department:  Medication Administration from 03/14/2024 1339 to 03/14/2024 1547   Date/Time Order Dose Route Action Action by Comments   03/14/2024 1416 EST sodium chloride  0.9 % IV bolus 1,000 mL 1,000 mL  Intravenous New Bag Leita Bloch --   03/14/2024 1411 EST acetaminophen  (TYLENOL ) tablet 1,000 mg 1,000 mg Oral   Given  Leita Bloch --   03/14/2024 1411 EST metoclopramide (REGLAN) injection 10 mg 10 mg Intravenous  Given Leita Bloch --   03/14/2024 1411 EST diphenhydrAMINE  (BENADRYL ) injection 25 mg 25 mg  Intravenous Given Leita Bloch --   03/14/2024 1530 EST ketorolac  (TORADOL ) injection 15 mg 15 mg Intravenous   Given  Leita Bloch --  Patients ED Course:  Vitals:   03/14/24 1345  BP: 131/75  Patient Position: Semi-Fowlers  Pulse: 81  Resp: 20  Temp: 98.1  F (36.7  C)  TempSrc: Oral  SpO2: 100%  Weight: 84.4 kg (186 lb)  Height: 64 (162.6 cm)  The patient is  presenting with a headache.  Patient's headache could be possibly benign tension headache versus   cervicogenic  migraine headache.  No congestion, rhinorrhea, or other infectious symptoms, makes sinusitis an  unlikely cause of the patient??s headache. Supple neck with full ROM without  nuchal rigidity or meningismus, no fever, and no AMS, makes meningitis less  likely.  No AMS and BP at patient??s baseline, makes hypertensive headache   less  likely. Headache that was non-maximal at onset, no history of LOC or seizure  activity, no history of trauma, no cancer history, no history of recent   systemic  bacterial infection, and non-focal neurologic exam makes acute intracranial  process and intracranial mass lesion less likely.  Low suspicion for temporal  arteritis, trigeminal neuralgia, or cluster headache, given no characteristic  symptoms or clinical findings on exam.  The patients symptoms were treated with the above noted medications. On  re-evaluation, the patient reports improvement of symptoms. At this point,   given  the history, exam, and work up, I think it less likely that there is an  immediately emergent cause of the patient's headache. I believe the patient   can  trial outpatient management with strict return precautions. The patient is  comfortable with this plan and all questions were answered.  Comorbidities:  The patient has the following comorbidity that contribute to the increase risk  of complications with the present diagnosis and treatment.  I have discussed  with the patient the risks and importance of monitoring.  Patient understands  and agrees.    Diabetes  Patient final diagnosis and disposition:  Social determinants of health affecting treatment: None  Prescription Drug / Test  Management : The Patient's current medications were  reviewed on today's visit and the following changes were recommended.  Medrol   Dosepak  Patient was given the signs and symptoms of worsening condition and  told to  return if they occurred.  Patient understands the plan and management and all  questions were answered.  Patient will follow up with her primary care   physician  in the next 2-3 days.  Patient was instructed on Culture results and   preliminary  radiology reads and the she would be contacted if a variance or positive test   is  obtained.  The nursing note was reviewed, agreed and appreciated.  Final Diagnosis:  1. Acute nonintractable headache, unspecified headache type  The patient was given the following medications to go home with:  Medication List  START taking these medications  methylPREDNIsolone  4 mg tablet pack  Commonly known as: MEDROL  DOSEPAK  Take 6 tablets by mouth daily for 1 day, THEN 5 tablets daily for 1 day, THEN   4  tablets daily for 1 day, THEN 3 tablets daily for 1 day, THEN 2 tablets daily  for 1 day, THEN 1 tablet daily for 1 day. .  Start taking on: March 14, 2024  ASK your doctor about these medications  erythromycin  0.5 % ophthalmic ointment  Place 1 inch into left eye 4 (four) times daily as needed.  ferrous sulfate  325 (65 FE) MG Tabs  fluconazole  150 mg tablet  Commonly known as: Diflucan   Take 1 tablet by mouth every third day as needed for up to 2  doses.  insulin  lispro 100 Units/mL Soln subcutaneous injection  Commonly known as: HUMALOG   Levemir  FlexPen 100 UNIT/ML injection pen  Generic drug: insulin  detemir  levothyroxine  50 MCG Tabs  Commonly known as: SYNTHROID , LEVOTHROID  losartan  25 MG Tabs  Commonly known as: COZAAR   metFORMIN  500 MG Tabs  Commonly known as: GLUCOPHAGE   methocarbamol  750 MG Tabs  Commonly known as: ROBAXIN   Take 1 tablet by mouth 3 (three) times daily as needed.  ProAir  HFA 108 (90 Base) MCG/ACT inhaler  Generic drug: albuterol   2 puffs q 6 hours as needed  simvastatin  40 MG Tabs  Commonly known as: ZOCOR   * Singulair  10 MG Tabs  Generic drug: montelukast   1 TABLET EVERY EVENING  * montelukast  10 MG Tabs  Commonly known as: SINGULAIR    * This  list has 2 medication(s) that are the same as other medications  prescribed for you. Read the directions carefully, and ask your doctor or   other  care provider to review them with you.  Where to Get Your Medications  These medications were sent to [facility] [id number] [place], [place] -   [id number] AT [facility] [address]   Phone: [phone]  methylPREDNIsolone  4 mg tablet pack  Follow-up Information   Follow up With Specialties Details Why Contact Info   Alexandro Asal, CNP  In 1 week For a recheck [address]  [facility] [address]  [phone]  Please note that some or all of this record was generated using voice  recognition software. If there are any questions about the content of this  document, please contact the author as some errors in transcription may have  occurred.  Sharron Ulanda Jansky, MD  03/14/24 1547  "

## 2024-03-26 DIAGNOSIS — R519 Headache, unspecified: Principal | ICD-10-CM

## 2024-03-26 NOTE — ED Provider Notes (Signed)
 "  THE [facility]  EMERGENCY DEPARTMENT ENCOUNTER          PHYSICIAN ASSISTANT NOTE       Date of evaluation: 03/26/2024    Chief Complaint     Shortness of Breath (C/o Shortness of breath that started this week ), Headache (C/o headaches on and off x 6 months, base of neck up to left side of head ), and OTHER (Out of MEDS since September )      History of Present Illness     Shelly Richard is a 38 y.o. female who presents for headache and shortness of breath.  Patient reports she has been dealing with a headache on and off for months.  Reports she has been to other hospitals and had a head CT and a neck x-ray and was given muscle relaxants and steroids but none of it is helping.  Patient has been taking ibuprofen  and Tylenol  at home and it still does not work.  Reports the pain comes from the back of her head and extends forward.  Reports she has not been able to follow-up with her primary care or neurologist because her insurance does not start until January 1.  Reports now she feels short of breath.  Reports this has been ongoing since about Monday.  Reports she think she had a fever on Monday but none since.  Reports she does not have cold symptoms or cough.  No chest pain.  No leg pain or swelling.  On chart review appears the patient has history of diabetes and hypothyroidism.  On further discussion it seems that the patient has been out of her medications for a few months and has not been able to check her blood sugar either.  Reports she does have an inhaler at home but did not use it because she does not feel like she is having an asthma exacerbation and has not noticed any wheezing.    ASSESSMENT / PLAN  (MEDICAL DECISION MAKING)     INITIAL VITALS: BP: (!) 166/85, Temp: 97.9 F (36.6 C), Pulse: (!) 136, Respirations: 18, SpO2: 100 %    Shelly Richard is a 38 y.o. female with headache and shortness of breath.  On arrival the patient is alert and in no acute distress but does seem short of  breath.  Patient is oxygenating at 100% on room air but is tachycardic to 136 and is hypertensive.  Point-of-care glucose is over 400.  Fluids initiated.  VBG pH is 7.1.  BMP shows a glucose of 418 with a sodium of 128 and an anion gap of 28.  Potassium is 4.2.  Leukocytosis to 22.7.  Baseline anemia.  Normal lactic acid.  Urinalysis with ketones.  Urinalysis is nitrite positive but is likely due to greater than 100 red blood cells, only 0-2 white blood cells.  Urine culture sent.  Insulin  drip initiated with fluids and potassium.  Patient will be admitted for further management.    Is this patient to be included in the SEP-1 core measure? No Exclusion criteria - the patient is NOT to be included for SEP-1 Core Measure due to: Alternative explanation for abnormal labs/vitals that do not relate to sepsis, see MDM for further explanation    Medical Decision Making  Amount and/or Complexity of Data Reviewed  Labs: ordered.  Radiology: ordered.    Risk  Prescription drug management.  Decision regarding hospitalization.        This patient was also evaluated by  the attending physician. All care plans were discussed and agreed upon.    Clinical Impression     1. Acute nonintractable headache, unspecified headache type    2. Diabetic ketoacidosis without coma associated with type 2 diabetes mellitus (HCC)        Disposition     PATIENT REFERRED TO:  No follow-up provider specified.    DISCHARGE MEDICATIONS:  New Prescriptions    No medications on file       DISPOSITION Decision To Admit 03/27/2024 12:29:06 AM   DISPOSITION CONDITION Stable             Diagnostic Results and Other Data     RADIOLOGY:  XR CHEST PORTABLE   Final Result      Clear lungs.      Normal cardiac mediastinal silhouette.      Electronically signed by Lynwood Schwalbe, MD          LABS:   Results for orders placed or performed during the hospital encounter of 03/26/24   CBC with Auto Differential   Result Value Ref Range    WBC 22.7 (H) 4.0 - 11.0 K/uL     RBC 4.35 4.00 - 5.20 M/uL    Hemoglobin 10.9 (L) 12.0 - 16.0 g/dL    Hematocrit 65.1 (L) 36.0 - 48.0 %    MCV 80.0 80.0 - 100.0 fL    MCH 25.0 (L) 26.0 - 34.0 pg    MCHC 31.3 31.0 - 36.0 g/dL    RDW 83.5 (H) 87.5 - 15.4 %    Platelets 503 (H) 135 - 450 K/uL    MPV 8.6 5.0 - 10.5 fL    Neutrophils % 83.2 %    Lymphocytes % 7.4 %    Monocytes % 8.2 %    Eosinophils % 0.4 %    Basophils % 0.8 %    Neutrophils Absolute 18.9 (H) 1.7 - 7.7 K/uL    Lymphocytes Absolute 1.7 1.0 - 5.1 K/uL    Monocytes Absolute 1.9 (H) 0.0 - 1.3 K/uL    Eosinophils Absolute 0.1 0.0 - 0.6 K/uL    Basophils Absolute 0.2 0.0 - 0.2 K/uL   BMP w/ Reflex to MG   Result Value Ref Range    Sodium 128 (L) 136 - 145 mmol/L    Potassium reflex Magnesium  4.2 3.5 - 5.1 mmol/L    Chloride 92 (L) 99 - 110 mmol/L    CO2 8 (LL) 21 - 32 mmol/L    Anion Gap 28 (H) 3 - 16    Glucose 418 (H) 70 - 99 mg/dL    BUN 7 7 - 20 mg/dL    Creatinine 0.9 0.6 - 1.1 mg/dL    Est, Glom Filt Rate 83     Calcium  10.4 8.3 - 10.6 mg/dL   HCG Qualitative, Serum   Result Value Ref Range    Preg, Serum Negative Detects HCG level >10 MIU/mL   Blood Gas, Venous   Result Value Ref Range    pH, Ven 7.196 (LL) 7.350 - 7.450    pCO2, Ven 28.5 (L) 41.0 - 51.0 mmHg    PO2, Ven <30.0 25.0 - 40.0 mmHg    HCO3, Venous 11.0 (L) 24.0 - 28.0 mmol/L    Base Excess, Ven -15.7 (L) -2.0 - 3.0 mmol/L    O2 Sat, Ven 45 Not established %    Carboxyhemoglobin 1.8 (H) 0.0 - 1.5 %    MetHgb, Ven 0.0 0.0 - 1.5 %  TC02 (Calc), Ven 12 mmol/L    Hemoglobin, Ven, Reduced 54.30 %   Lactic Acid   Result Value Ref Range    Lactic Acid 1.6 0.4 - 2.0 mmol/L   Urinalysis with Microscopic   Result Value Ref Range    Color, UA Yellow Straw/Yellow    Clarity, UA Clear Clear    Glucose, Ur 250 (A) Negative mg/dL    Bilirubin, Urine MODERATE (A) Negative    Ketones, Urine >=80 (A) Negative mg/dL    Specific Gravity, UA >=1.030 1.005 - 1.030    Blood, Urine MODERATE (A) Negative    pH, Urine 6.0 5.0 - 8.0    Protein,  UA 100 (A) Negative mg/dL    Urobilinogen, Urine 0.2 <2.0 E.U./dL    Nitrite, Urine POSITIVE (A) Negative    Leukocyte Esterase, Urine Negative Negative    Microscopic Examination YES     Urine Type Voided     Hyaline Casts, UA 6-10 (A) 0 - 2 /LPF    WBC, UA 0-2 0 - 5 /HPF    RBC, UA >100 (A) 0 - 4 /HPF    Epithelial Cells, UA 2-5 0 - 5 /HPF    Bacteria, UA 1+ (A) None Seen /HPF   TSH with Reflex   Result Value Ref Range    TSH Reflex FT4 1.19 0.27 - 4.20 uIU/mL   POCT Glucose   Result Value Ref Range    POC Glucose 416 (H) 70 - 99 mg/dl    Performed on ACCU-CHEK      EKG   Interpreted in conjunction with emergency department physician No att. providers found  Rhythm: sinus tachycardia  Rate: 135  Axis: normal  Ectopy: none  Conduction: normal  ST Segments: no acute change  T Waves: no acute change  Q Waves:none  Clinical Impression: no acute changes  Comparison:  02/10/19    ED BEDSIDE ULTRASOUND:  No results found.    RECENT VITALS:  BP: (!) 161/79, Temp: 97.9 F (36.6 C), Pulse: (!) 124, Respirations: 18, SpO2: 100 %     Procedures         ED Course     Nursing Notes, Past Medical Hx,Past Surgical Hx, Social Hx, Allergies, and Family Hx were reviewed.         The patient was given the following medications:  Orders Placed This Encounter   Medications    lactated ringers  bolus 1,000 mL    ketorolac  (TORADOL ) injection 15 mg    insulin  regular (HumuLIN  R;NovoLIN  R) 100 Units in sodium chloride  0.9 % 100 mL infusion     Goal Blood Glucose Range:   DKA: 150-200     Calculate initial bolus with the embedded Insulin  Calculator?:   No    OR Linked Order Group     dextrose  bolus 10% 125 mL     dextrose  bolus 10% 250 mL    potassium chloride  10 mEq/100 mL IVPB (Peripheral Line)    magnesium  sulfate 2000 mg in 50 mL IVPB premix    sodium phosphate  15 mmol in sodium chloride  0.9 % 250 mL IVPB    FOLLOWED BY Linked Order Group     sodium chloride  0.9 % bolus 1,136 mL     0.9 % sodium chloride  infusion    DISCONTD: dextrose   5 % and 0.45 % sodium chloride  infusion       CONSULTS:  IP CONSULT TO CRITICAL CARE    Review of Systems  Review of Systems   Constitutional: Negative.    Respiratory:  Positive for shortness of breath. Negative for cough.    Cardiovascular: Negative.  Negative for chest pain and leg swelling.   Gastrointestinal: Negative.    Musculoskeletal: Negative.    Skin: Negative.    Neurological:  Positive for headaches.   Psychiatric/Behavioral: Negative.     All other systems reviewed and are negative.      Past Medical, Surgical, Family, and Social History     She has a past medical history of Acute headache, Allergies, Asthma, Diabetes mellitus (HCC), Genital herpes, HTN (hypertension), Hyperlipidemia, Hypothyroid, and Urticaria.  She has a past surgical history that includes cyst removal; hysteroscopy; and myomectomy (N/A, 02/01/2022).  Her family history includes Diabetes in her father and mother.  She reports that she has never smoked. She has never used smokeless tobacco. She reports that she does not drink alcohol and does not use drugs.    Medications     Previous Medications    ACETAMINOPHEN  (TYLENOL ) 500 MG TABLET    Take 1 tablet by mouth 4 times daily as needed for Pain    ALBUTEROL  SULFATE HFA 108 (90 BASE) MCG/ACT INHALER    Inhale 2 puffs into the lungs every 6 hours as needed for Wheezing or Shortness of Breath    BLOOD GLUCOSE MONITOR STRIPS    Tes2t qam  times a day & as needed for symptoms of irregular blood glucose. Please provide test strips approved by insurance.    BLOOD GLUCOSE MONITORING SUPPL (ACURA BLOOD GLUCOSE METER) W/DEVICE KIT    Please provide meter approved by insurance.  Check bs qam    CETIRIZINE  (ZYRTEC ) 10 MG TABLET    Take 1 tablet by mouth daily    CHOLECALCIFEROL (VITAMIN D3) 50 MCG (2000 UT) TABS    TAKE ONE TABLET BY MOUTH DAILY    CONTINUOUS BLOOD GLUC SENSOR (FREESTYLE LIBRE 2 SENSOR) MISC    USE TO TEST BLOOD GLUCOSE AS DIRECTED    CONTINUOUS GLUCOSE SENSOR (FREESTYLE LIBRE  2 SENSOR) MISC    APPLY SENSOR TO SKIN FOR CONTINUOUS BLOOD SUGAR MONITORING, REPLACE EVERY 14 DAYS    DAPAGLIFLOZIN  (FARXIGA ) 10 MG TABLET    Take 1 tablet by mouth every morning    EPINEPHRINE (EPIPEN) 0.3 MG/0.3ML SOAJ INJECTION        FERROUS SULFATE  (FEROSUL) 325 (65 FE) MG TABLET    Take 1 tablet by mouth daily (with breakfast)    FUROSEMIDE  (LASIX ) 20 MG TABLET    Take 1 tablet by mouth daily as needed (swelling)    IBUPROFEN  (ADVIL ;MOTRIN ) 600 MG TABLET    Take 1 tablet by mouth every 6 hours as needed for Pain    INSULIN  DETEMIR (LEVEMIR  FLEXPEN) 100 UNIT/ML INJECTION PEN    INJECT 20 UNITS UNDER THE SKIN ONCE NIGHTLY    INSULIN  GLARGINE (LANTUS  SOLOSTAR) 100 UNIT/ML INJECTION PEN    Inject 40 Units into the skin nightly    INSULIN  LISPRO, 1 UNIT DIAL , (HUMALOG /ADMELOG ) 100 UNIT/ML SOPN    INJECT 10 UNITS UNDER THE SKIN WITH MEALS PLUS INJECT 2 UNITS FOR BLOOD SUGAR  150-200, 4 UNITS IF 201-250, 6 UNITS IF 251-300, 8 UNITS IF 301-350, 10 UNITS IF GREATER THAN 350    INSULIN  PEN NEEDLE (DROPLET PEN NEEDLES) 32G X 6 MM MISC    USE ONCE DAILY    INSULIN  PEN NEEDLE ([employer] PEN NEEDLES 31G) 31G X 8 MM MISC    1 each  by Does not apply route daily    INSULIN  PEN NEEDLE 32G X 6 MM MISC    Use four times daily and as needed    LANCETS MISC    1 each by Does not apply route daily Please provide lancets approved by insurance.    LETROZOLE (FEMARA) 2.5 MG TABLET        LEVOTHYROXINE  (SYNTHROID ) 75 MCG TABLET    TAKE ONE TABLET BY MOUTH DAILY    LOSARTAN  (COZAAR ) 25 MG TABLET    TAKE ONE TABLET BY MOUTH DAILY    METFORMIN  (GLUCOPHAGE -XR) 500 MG EXTENDED RELEASE TABLET    Take 4 tablets by mouth daily (with breakfast)    MONTELUKAST  (SINGULAIR ) 10 MG TABLET    Take 1 tablet by mouth daily    SEMAGLUTIDE , 1 MG/DOSE, (OZEMPIC , 1 MG/DOSE,) 4 MG/3ML SOPN SC INJECTION    Inject 1 mg into the skin every 7 days    SIMVASTATIN  (ZOCOR ) 40 MG TABLET    Take 1 tablet by mouth daily    VALACYCLOVIR  (VALTREX ) 500 MG TABLET    TAKE 1  TABLET BY MOUTH DAILY       Allergies     She is allergic to ace inhibitors, lisinopril, and peanuts [peanut oil].    Physical Exam     INITIAL VITALS: BP: (!) 166/85, Temp: 97.9 F (36.6 C), Pulse: (!) 136, Respirations: 18, SpO2: 100 %  Physical Exam  Vitals and nursing note reviewed.   Constitutional:       General: She is not in acute distress.  HENT:      Head: Normocephalic and atraumatic.   Neck:      Comments: No meningismus.  Cardiovascular:      Rate and Rhythm: Regular rhythm. Tachycardia present.   Pulmonary:      Effort: Tachypnea present.      Breath sounds: No decreased breath sounds, wheezing, rhonchi or rales.   Musculoskeletal:         General: Normal range of motion.      Cervical back: Normal range of motion and neck supple.   Skin:     General: Skin is warm and dry.   Neurological:      General: No focal deficit present.      Mental Status: She is alert and oriented to person, place, and time.   Psychiatric:         Mood and Affect: Mood normal.         Behavior: Behavior normal.             Arloa Mardy BIRCH, PA-C  03/27/24 0047    "

## 2024-03-27 ENCOUNTER — Inpatient Hospital Stay: Admit: 2024-03-28 | Payer: Medicaid (Managed Care) | Primary: Oncology

## 2024-03-27 ENCOUNTER — Emergency Department: Admit: 2024-03-27 | Payer: Medicaid (Managed Care) | Primary: Oncology

## 2024-03-27 ENCOUNTER — Inpatient Hospital Stay
Admission: EM | Admit: 2024-03-27 | Discharge: 2024-03-27 | Disposition: A | Discharge status: Home / Self Care | Payer: Medicaid (Managed Care) | Arrived: WI | Admitting: Student in an Organized Health Care Education/Training Program

## 2024-03-27 LAB — BASIC METABOLIC PANEL
Anion Gap: 19 — ABNORMAL HIGH (ref 3–16)
Anion Gap: 11 (ref 3–16)
Anion Gap: 11 (ref 3–16)
Anion Gap: 12 (ref 3–16)
BUN: 5 mg/dL — ABNORMAL LOW (ref 7–20)
BUN: 4 mg/dL — ABNORMAL LOW (ref 7–20)
BUN: 3 mg/dL — ABNORMAL LOW (ref 7–20)
BUN: <2 mg/dL — ABNORMAL LOW (ref 7–20)
CO2: 12 mmol/L — CL (ref 21–32)
CO2: 16 mmol/L — ABNORMAL LOW (ref 21–32)
CO2: 17 mmol/L — ABNORMAL LOW (ref 21–32)
CO2: 17 mmol/L — ABNORMAL LOW (ref 21–32)
Calcium: 9 mg/dL (ref 8.3–10.6)
Calcium: 8.8 mg/dL (ref 8.3–10.6)
Calcium: 8.7 mg/dL (ref 8.3–10.6)
Calcium: 8.5 mg/dL (ref 8.3–10.6)
Chloride: 99 mmol/L (ref 99–110)
Chloride: 101 mmol/L (ref 99–110)
Chloride: 99 mmol/L (ref 99–110)
Chloride: 98 mmol/L — ABNORMAL LOW (ref 99–110)
Creatinine: 0.6 mg/dL (ref 0.6–1.1)
Creatinine: 0.6 mg/dL (ref 0.6–1.1)
Creatinine: 0.5 mg/dL — ABNORMAL LOW (ref 0.6–1.1)
Creatinine: 0.5 mg/dL — ABNORMAL LOW (ref 0.6–1.1)
Est, Glom Filt Rate: >90
Est, Glom Filt Rate: >90
Est, Glom Filt Rate: >90
Est, Glom Filt Rate: >90
Glucose: 245 mg/dL — ABNORMAL HIGH (ref 70–99)
Glucose: 207 mg/dL — ABNORMAL HIGH (ref 70–99)
Glucose: 180 mg/dL — ABNORMAL HIGH (ref 70–99)
Glucose: 178 mg/dL — ABNORMAL HIGH (ref 70–99)
Potassium: 3.3 mmol/L — ABNORMAL LOW (ref 3.5–5.1)
Potassium: 3.8 mmol/L (ref 3.5–5.1)
Potassium: 4 mmol/L (ref 3.5–5.1)
Potassium: 3.5 mmol/L (ref 3.5–5.1)
Sodium: 130 mmol/L — ABNORMAL LOW (ref 136–145)
Sodium: 128 mmol/L — ABNORMAL LOW (ref 136–145)
Sodium: 127 mmol/L — ABNORMAL LOW (ref 136–145)
Sodium: 127 mmol/L — ABNORMAL LOW (ref 136–145)

## 2024-03-27 LAB — URINALYSIS WITH MICROSCOPIC
Glucose, Ur: 250 mg/dL — AB
Ketones, Urine: >=80 mg/dL — AB
Leukocyte Esterase, Urine: NEGATIVE
Nitrite, Urine: POSITIVE — AB
Protein, UA: 100 mg/dL — AB
RBC, UA: >100 /HPF — AB (ref 0–4)
Specific Gravity, UA: >=1.03 (ref 1.005–1.030)
Urobilinogen, Urine: 0.2 U/dL (ref <2.0)
pH, Urine: 6 (ref 5.0–8.0)

## 2024-03-27 LAB — CBC WITH AUTO DIFFERENTIAL
Basophils %: 0.8 %
Basophils Absolute: 0.2 10*3/uL (ref 0.0–0.2)
Eosinophils %: 0.4 %
Eosinophils Absolute: 0.1 10*3/uL (ref 0.0–0.6)
Hematocrit: 34.8 % — ABNORMAL LOW (ref 36.0–48.0)
Hemoglobin: 10.9 g/dL — ABNORMAL LOW (ref 12.0–16.0)
Lymphocytes %: 7.4 %
Lymphocytes Absolute: 1.7 10*3/uL (ref 1.0–5.1)
MCH: 25 pg — ABNORMAL LOW (ref 26.0–34.0)
MCHC: 31.3 g/dL (ref 31.0–36.0)
MCV: 80 fL (ref 80.0–100.0)
MPV: 8.6 fL (ref 5.0–10.5)
Monocytes %: 8.2 %
Monocytes Absolute: 1.9 10*3/uL — ABNORMAL HIGH (ref 0.0–1.3)
Neutrophils %: 83.2 %
Neutrophils Absolute: 18.9 10*3/uL — ABNORMAL HIGH (ref 1.7–7.7)
Platelets: 503 10*3/uL — ABNORMAL HIGH (ref 135–450)
RBC: 4.35 M/uL (ref 4.00–5.20)
RDW: 16.4 % — ABNORMAL HIGH (ref 12.4–15.4)
WBC: 22.7 10*3/uL — ABNORMAL HIGH (ref 4.0–11.0)

## 2024-03-27 LAB — BLOOD GAS, VENOUS
Base Excess, Ven: -15.7 mmol/L — ABNORMAL LOW (ref <=3.0)
Carboxyhemoglobin: 1.8 % — ABNORMAL HIGH (ref 0.0–1.5)
HCO3, Venous: 11 mmol/L — ABNORMAL LOW (ref 24.0–28.0)
Hemoglobin, Ven, Reduced: 54.3 %
MetHgb, Ven: 0 % (ref 0.0–1.5)
O2 Sat, Ven: 45 %
PO2, Ven: <30 mmHg (ref 25.0–40.0)
TC02 (Calc), Ven: 12 mmol/L
pCO2, Ven: 28.5 mmHg — ABNORMAL LOW (ref 41.0–51.0)
pH, Ven: 7.196 — CL (ref 7.350–7.450)

## 2024-03-27 LAB — POCT GLUCOSE
POC Glucose: 172 mg/dL — ABNORMAL HIGH (ref 70–99)
POC Glucose: 174 mg/dL — ABNORMAL HIGH (ref 70–99)
POC Glucose: 180 mg/dL — ABNORMAL HIGH (ref 70–99)
POC Glucose: 181 mg/dL — ABNORMAL HIGH (ref 70–99)
POC Glucose: 187 mg/dL — ABNORMAL HIGH (ref 70–99)
POC Glucose: 189 mg/dL — ABNORMAL HIGH (ref 70–99)
POC Glucose: 193 mg/dL — ABNORMAL HIGH (ref 70–99)
POC Glucose: 194 mg/dL — ABNORMAL HIGH (ref 70–99)
POC Glucose: 205 mg/dL — ABNORMAL HIGH (ref 70–99)
POC Glucose: 209 mg/dL — ABNORMAL HIGH (ref 70–99)
POC Glucose: 212 mg/dL — ABNORMAL HIGH (ref 70–99)
POC Glucose: 216 mg/dL — ABNORMAL HIGH (ref 70–99)
POC Glucose: 226 mg/dL — ABNORMAL HIGH (ref 70–99)
POC Glucose: 232 mg/dL — ABNORMAL HIGH (ref 70–99)
POC Glucose: 234 mg/dL — ABNORMAL HIGH (ref 70–99)
POC Glucose: 282 mg/dL — ABNORMAL HIGH (ref 70–99)
POC Glucose: 328 mg/dL — ABNORMAL HIGH (ref 70–99)
POC Glucose: 384 mg/dL — ABNORMAL HIGH (ref 70–99)
POC Glucose: 416 mg/dL — ABNORMAL HIGH (ref 70–99)

## 2024-03-27 LAB — EKG 12-LEAD
Atrial Rate: 135 {beats}/min
P Axis: 50 degrees
P-R Interval: 142 ms
Q-T Interval: 284 ms
QRS Duration: 68 ms
QTc Calculation (Bazett): 426 ms
R Axis: 27 degrees
T Axis: 36 degrees
Ventricular Rate: 135 {beats}/min

## 2024-03-27 LAB — POCT VENOUS
Base Excess, Ven: -9 — ABNORMAL LOW (ref <=3)
Calcium, Ionized: 1.26 mmol/L (ref 1.12–1.32)
HCO3, Venous: 16.8 mmol/L — ABNORMAL LOW (ref 23.0–29.0)
Lactate: 0.75 mmol/L (ref 0.40–2.00)
O2 Sat, Ven: 38 %
PO2, Ven: 24 mmHg
POC Glucose: 224 mg/dL — ABNORMAL HIGH (ref 70–99)
POC Potassium: 3.4 mmol/L — ABNORMAL LOW (ref 3.5–5.1)
POC Sodium: 135 mmol/L — ABNORMAL LOW (ref 136–145)
TC02 (Calc), Ven: 18 mmol/L
pCO2, Ven: 33.2 mmHg — ABNORMAL LOW (ref 40.0–50.0)
pH, Ven: 7.313 — ABNORMAL LOW (ref 7.350–7.450)

## 2024-03-27 LAB — HEMOGLOBIN A1C
Estimated Avg Glucose: 352.2 mg/dL
Hemoglobin A1C: 13.9 %

## 2024-03-27 LAB — BASIC METABOLIC PANEL W/ REFLEX TO MG FOR LOW K
Anion Gap: 28 — ABNORMAL HIGH (ref 3–16)
BUN: 7 mg/dL (ref 7–20)
CO2: 8 mmol/L — CL (ref 21–32)
Calcium: 10.4 mg/dL (ref 8.3–10.6)
Chloride: 92 mmol/L — ABNORMAL LOW (ref 99–110)
Creatinine: 0.9 mg/dL (ref 0.6–1.1)
Est, Glom Filt Rate: 83
Glucose: 418 mg/dL — ABNORMAL HIGH (ref 70–99)
Potassium reflex Magnesium: 4.2 mmol/L (ref 3.5–5.1)
Sodium: 128 mmol/L — ABNORMAL LOW (ref 136–145)

## 2024-03-27 LAB — TSH REFLEX TO FT4: TSH Reflex FT4: 1.19 u[IU]/mL (ref 0.27–4.20)

## 2024-03-27 LAB — MAGNESIUM
Magnesium: 1.82 mg/dL (ref 1.80–2.40)
Magnesium: 1.69 mg/dL — ABNORMAL LOW (ref 1.80–2.40)
Magnesium: 1.59 mg/dL — ABNORMAL LOW (ref 1.80–2.40)
Magnesium: 2.05 mg/dL (ref 1.80–2.40)

## 2024-03-27 LAB — PHOSPHORUS
Phosphorus: 2.7 mg/dL (ref 2.5–4.9)
Phosphorus: 2.3 mg/dL — ABNORMAL LOW (ref 2.5–4.9)
Phosphorus: 1.9 mg/dL — ABNORMAL LOW (ref 2.5–4.9)
Phosphorus: 2 mg/dL — ABNORMAL LOW (ref 2.5–4.9)

## 2024-03-27 LAB — COVID-19 & INFLUENZA COMBO
Influenza A: NOT DETECTED
Influenza B: NOT DETECTED
SARS-CoV-2 RNA, RT PCR: NOT DETECTED

## 2024-03-27 LAB — BETA-HYDROXYBUTYRATE: Beta-Hydroxybutyrate: 2.98 mmol/L — ABNORMAL HIGH (ref 0.00–0.27)

## 2024-03-27 LAB — LACTIC ACID: Lactic Acid: 1.6 mmol/L (ref 0.4–2.0)

## 2024-03-27 LAB — HCG, SERUM, QUALITATIVE: Preg, Serum: NEGATIVE

## 2024-03-27 LAB — D-DIMER, QUANTITATIVE: D-Dimer, Quant: 1.49 ug{FEU}/mL — ABNORMAL HIGH (ref 0.00–0.60)

## 2024-03-27 MED ORDER — POTASSIUM CHLORIDE 10 MEQ/100ML IV SOLN
10 | INTRAVENOUS | Status: DC | PRN
Start: 2024-03-27 — End: 2024-03-29

## 2024-03-27 MED ORDER — LACTATED RINGERS IV BOLUS
Freq: Once | INTRAVENOUS | Status: DC
Start: 2024-03-27 — End: 2024-03-27

## 2024-03-27 MED ORDER — HYDROMORPHONE HCL PF 1 MG/ML IJ SOLN
1 | INTRAMUSCULAR | Status: DC | PRN
Start: 2024-03-27 — End: 2024-03-27

## 2024-03-27 MED ORDER — SODIUM CHLORIDE (PF) 0.9 % IJ SOLN
0.9 | Freq: Every day | INTRAMUSCULAR | Status: DC
Start: 2024-03-27 — End: 2024-03-29
  Administered 2024-03-27 – 2024-03-29 (×3): 40 mg via INTRAVENOUS

## 2024-03-27 MED ORDER — HYDROMORPHONE HCL PF 1 MG/ML IJ SOLN
1 | INTRAMUSCULAR | Status: AC | PRN
Start: 2024-03-27 — End: 2024-03-31

## 2024-03-27 MED ORDER — POTASSIUM CHLORIDE 10 MEQ/100ML IV SOLN
10 | INTRAVENOUS | Status: DC | PRN
Start: 2024-03-27 — End: 2024-03-27

## 2024-03-27 MED ORDER — SODIUM CHLORIDE 0.9 % IV BOLUS
0.9 | Freq: Once | INTRAVENOUS | Status: DC
Start: 2024-03-27 — End: 2024-03-27

## 2024-03-27 MED ORDER — DEXTROSE 10 % IV BOLUS
INTRAVENOUS | Status: DC | PRN
Start: 2024-03-27 — End: 2024-04-07

## 2024-03-27 MED ORDER — HYDROMORPHONE HCL PF 1 MG/ML IJ SOLN
1 | INTRAMUSCULAR | Status: AC | PRN
Start: 2024-03-27 — End: 2024-03-31
  Administered 2024-03-31 – 2024-04-01 (×2): 0.5 mg via INTRAVENOUS

## 2024-03-27 MED ORDER — ONDANSETRON HCL 4 MG/2ML IJ SOLN
4 | Freq: Four times a day (QID) | INTRAMUSCULAR | Status: DC | PRN
Start: 2024-03-27 — End: 2024-04-02

## 2024-03-27 MED ORDER — MAGNESIUM SULFATE 2000 MG/50 ML IVPB PREMIX
2 | INTRAVENOUS | Status: DC | PRN
Start: 2024-03-27 — End: 2024-04-07
  Administered 2024-03-27: 19:00:00 2000 mg via INTRAVENOUS

## 2024-03-27 MED ORDER — DEXTROSE 10 % IV BOLUS
INTRAVENOUS | Status: DC | PRN
Start: 2024-03-27 — End: 2024-03-27

## 2024-03-27 MED ORDER — SODIUM CHLORIDE 0.9 % IV SOLN
0.9 | INTRAVENOUS | Status: DC
Start: 2024-03-27 — End: 2024-03-27

## 2024-03-27 MED ORDER — SODIUM CHLORIDE 0.9 % IV SOLN
0.9 | INTRAVENOUS | Status: DC
Start: 2024-03-27 — End: 2024-03-28

## 2024-03-27 MED ORDER — NORMAL SALINE FLUSH 0.9 % IV SOLN
0.9 | INTRAVENOUS | Status: DC | PRN
Start: 2024-03-27 — End: 2024-04-02

## 2024-03-27 MED ORDER — GLUCAGON HCL (DIAGNOSTIC) 1 MG IJ SOLR
1 | INTRAMUSCULAR | Status: DC | PRN
Start: 2024-03-27 — End: 2024-04-07

## 2024-03-27 MED ORDER — SODIUM CHLORIDE 0.9 % IV SOLN
0.9 | INTRAVENOUS | Status: DC | PRN
Start: 2024-03-27 — End: 2024-04-07
  Administered 2024-03-27 – 2024-03-29 (×5): 15 mmol via INTRAVENOUS

## 2024-03-27 MED ORDER — KETOROLAC TROMETHAMINE 30 MG/ML IJ SOLN
30 | Freq: Once | INTRAMUSCULAR | Status: AC
Start: 2024-03-27 — End: 2024-03-27
  Administered 2024-03-27: 05:00:00 15 mg via INTRAVENOUS

## 2024-03-27 MED ORDER — ACETAMINOPHEN 650 MG RE SUPP
650 | Freq: Four times a day (QID) | RECTAL | Status: DC | PRN
Start: 2024-03-27 — End: 2024-04-02

## 2024-03-27 MED ORDER — SODIUM CHLORIDE 0.9 % IV SOLN
0.9 | INTRAVENOUS | Status: DC | PRN
Start: 2024-03-27 — End: 2024-04-02

## 2024-03-27 MED ORDER — ENOXAPARIN SODIUM 40 MG/0.4ML IJ SOSY
40 | Freq: Every day | INTRAMUSCULAR | Status: DC
Start: 2024-03-27 — End: 2024-04-02
  Administered 2024-03-27 – 2024-03-29 (×3): 40 mg via SUBCUTANEOUS

## 2024-03-27 MED ORDER — LACTATED RINGERS IV BOLUS
Freq: Once | INTRAVENOUS | Status: AC
Start: 2024-03-27 — End: 2024-03-27
  Administered 2024-03-27: 21:00:00 500 mL via INTRAVENOUS

## 2024-03-27 MED ORDER — HYDROMORPHONE HCL PF 1 MG/ML IJ SOLN
1 | INTRAMUSCULAR | Status: DC | PRN
Start: 2024-03-27 — End: 2024-03-27
  Administered 2024-03-27: 13:00:00 0.5 mg via INTRAVENOUS

## 2024-03-27 MED ORDER — MAGNESIUM SULFATE 2000 MG/50 ML IVPB PREMIX
2 | INTRAVENOUS | Status: DC | PRN
Start: 2024-03-27 — End: 2024-04-07

## 2024-03-27 MED ORDER — DEXTROSE 10 % IV SOLN
10 | INTRAVENOUS | Status: DC | PRN
Start: 2024-03-27 — End: 2024-04-07

## 2024-03-27 MED ORDER — SODIUM CHLORIDE 0.9 % IV BOLUS
0.9 | Freq: Once | INTRAVENOUS | Status: AC
Start: 2024-03-27 — End: 2024-03-27
  Administered 2024-03-27: 06:00:00 1136 mL/kg via INTRAVENOUS

## 2024-03-27 MED ORDER — NORMAL SALINE FLUSH 0.9 % IV SOLN
0.9 | Freq: Two times a day (BID) | INTRAVENOUS | Status: DC
Start: 2024-03-27 — End: 2024-04-02
  Administered 2024-03-27 – 2024-04-01 (×6): 10 mL via INTRAVENOUS

## 2024-03-27 MED ORDER — DEXTROSE-SODIUM CHLORIDE 5-0.45 % IV SOLN
5-0.45 | INTRAVENOUS | Status: DC | PRN
Start: 2024-03-27 — End: 2024-03-27

## 2024-03-27 MED ORDER — ALBUTEROL SULFATE (2.5 MG/3ML) 0.083% IN NEBU
Freq: Four times a day (QID) | RESPIRATORY_TRACT | Status: DC | PRN
Start: 2024-03-27 — End: 2024-04-07

## 2024-03-27 MED ORDER — SODIUM CHLORIDE 0.9 % IV SOLN
0.9 | INTRAVENOUS | Status: DC
Start: 2024-03-27 — End: 2024-03-27
  Administered 2024-03-27: 06:00:00 6.5 [IU]/h via INTRAVENOUS

## 2024-03-27 MED ORDER — POTASSIUM CHLORIDE CRYS ER 20 MEQ PO TBCR
20 | ORAL | Status: DC | PRN
Start: 2024-03-27 — End: 2024-03-29

## 2024-03-27 MED ORDER — LEVOTHYROXINE SODIUM 75 MCG PO TABS
75 | Freq: Every day | ORAL | Status: DC
Start: 2024-03-27 — End: 2024-03-28
  Administered 2024-03-27 – 2024-03-28 (×2): 75 ug via ORAL

## 2024-03-27 MED ORDER — MAGNESIUM SULFATE 2000 MG/50 ML IVPB PREMIX
2 | INTRAVENOUS | Status: DC | PRN
Start: 2024-03-27 — End: 2024-03-27

## 2024-03-27 MED ORDER — GLUCOSE 4 G PO CHEW
4 | ORAL | Status: DC | PRN
Start: 2024-03-27 — End: 2024-04-07

## 2024-03-27 MED ORDER — DEXTROSE-SODIUM CHLORIDE 5-0.45 % IV SOLN
5-0.45 | INTRAVENOUS | Status: DC | PRN
Start: 2024-03-27 — End: 2024-04-07
  Administered 2024-03-27 – 2024-03-29 (×7): via INTRAVENOUS

## 2024-03-27 MED ORDER — LACTATED RINGERS IV BOLUS
Freq: Once | INTRAVENOUS | Status: AC
Start: 2024-03-27 — End: 2024-03-27
  Administered 2024-03-27: 22:00:00 500 mL via INTRAVENOUS

## 2024-03-27 MED ORDER — ASPIRIN-ACETAMINOPHEN-CAFFEINE 250-250-65 MG PO TABS
250-250-65 | Freq: Once | ORAL | Status: AC
Start: 2024-03-27 — End: 2024-03-27
  Administered 2024-03-27: 14:00:00 1 via ORAL

## 2024-03-27 MED ORDER — LACTATED RINGERS IV BOLUS
Freq: Once | INTRAVENOUS | Status: AC
Start: 2024-03-27 — End: 2024-03-27
  Administered 2024-03-27: 05:00:00 1000 mL via INTRAVENOUS

## 2024-03-27 MED ORDER — POLYETHYLENE GLYCOL 3350 17 G PO PACK
17 | Freq: Every day | ORAL | Status: DC | PRN
Start: 2024-03-27 — End: 2024-04-07

## 2024-03-27 MED ORDER — ACETAMINOPHEN 325 MG PO TABS
325 | Freq: Four times a day (QID) | ORAL | Status: DC | PRN
Start: 2024-03-27 — End: 2024-04-02
  Administered 2024-03-27 – 2024-04-02 (×15): 650 mg via ORAL

## 2024-03-27 MED ORDER — SODIUM PHOSPHATE 3 MMOL/ML IV SOLN (MIXTURES ONLY)
3 | INTRAVENOUS | Status: DC | PRN
Start: 2024-03-27 — End: 2024-03-27

## 2024-03-27 MED ORDER — ONDANSETRON 4 MG PO TBDP
4 | Freq: Three times a day (TID) | ORAL | Status: DC | PRN
Start: 2024-03-27 — End: 2024-04-02

## 2024-03-27 MED ORDER — SODIUM CHLORIDE 0.9 % IV SOLN
0.9 | INTRAVENOUS | Status: DC
Start: 2024-03-27 — End: 2024-03-28
  Administered 2024-03-27: 08:00:00 5.4 [IU]/h via INTRAVENOUS
  Administered 2024-03-27: 17:00:00 13.4 [IU]/h via INTRAVENOUS
  Administered 2024-03-28: 9.4 [IU]/h via INTRAVENOUS
  Administered 2024-03-28: 14:00:00 5.4 [IU]/h via INTRAVENOUS
  Administered 2024-03-28: 04:00:00 16 [IU]/h via INTRAVENOUS

## 2024-03-27 MED ORDER — POTASSIUM BICARB-CITRIC ACID 20 MEQ PO TBEF
20 | ORAL | Status: DC | PRN
Start: 2024-03-27 — End: 2024-03-29

## 2024-03-27 MED ORDER — SODIUM CHLORIDE 0.9 % IV SOLN
0.9 | INTRAVENOUS | Status: DC
Start: 2024-03-27 — End: 2024-03-27
  Administered 2024-03-27: 07:00:00 via INTRAVENOUS

## 2024-03-27 MED ORDER — POTASSIUM CHLORIDE 10 MEQ/100ML IV SOLN
10 | INTRAVENOUS | Status: DC | PRN
Start: 2024-03-27 — End: 2024-03-29
  Administered 2024-03-27 – 2024-03-28 (×21): 10 meq via INTRAVENOUS

## 2024-03-27 MED FILL — POTASSIUM CHLORIDE 10 MEQ/100ML IV SOLN: 10 MEQ/0ML | INTRAVENOUS | Qty: 100 | Fill #0

## 2024-03-27 MED FILL — LEVOTHYROXINE SODIUM 75 MCG PO TABS: 75 ug | ORAL | Qty: 1 | Fill #0

## 2024-03-27 MED FILL — ACETAMINOPHEN 325 MG PO TABS: 325 mg | ORAL | Qty: 2 | Fill #0

## 2024-03-27 MED FILL — HUMULIN R 100 UNIT/ML IJ SOLN: 100 [IU]/mL | INTRAMUSCULAR | Qty: 100 | Fill #0

## 2024-03-27 MED FILL — PANTOPRAZOLE SODIUM 40 MG IV SOLR: 40 mg | INTRAVENOUS | Qty: 40 | Fill #0

## 2024-03-27 MED FILL — PAIN RELIEVER PLUS 250-250-65 MG PO TABS: 250-250-65 mg | ORAL | Qty: 1 | Fill #0

## 2024-03-27 MED FILL — SODIUM PHOSPHATES 15 MMOLE/5ML IV SOLN: 15 MMOLE/5ML | INTRAVENOUS | Qty: 5 | Fill #0

## 2024-03-27 MED FILL — ENOXAPARIN SODIUM 40 MG/0.4ML IJ SOSY: 40 MG/0.4ML | INTRAMUSCULAR | Qty: 0.4 | Fill #0

## 2024-03-27 MED FILL — HUMULIN R 100 UNIT/ML IJ SOLN: 100 [IU]/mL | INTRAMUSCULAR | Qty: 1 | Fill #0

## 2024-03-27 MED FILL — HYDROMORPHONE HCL 1 MG/ML IJ SOLN: 1 mg/mL | INTRAMUSCULAR | Qty: 1 | Fill #0

## 2024-03-27 MED FILL — MAGNESIUM SULFATE 2 GM/50ML IV SOLN: 2 GM/50ML | INTRAVENOUS | Qty: 50 | Fill #0

## 2024-03-27 MED FILL — KETOROLAC TROMETHAMINE 30 MG/ML IJ SOLN: 30 mg/mL | INTRAMUSCULAR | Qty: 1 | Fill #0

## 2024-03-27 NOTE — Progress Notes (Signed)
"  4 Eyes Admission Assessment     I agree as the admission nurse that 2 RN's have performed a thorough Head to Toe Skin Assessment on the patient. ALL assessment sites listed below have been assessed on admission.       Areas assessed by both nurses: yes  [x]    Head, Face, and Ears   [x]    Shoulders, Back, and Chest  [x]    Arms, Elbows, and Hands   [x]    Coccyx, Sacrum, and Ischum  [x]    Legs, Feet, and Heels        Does the Patient have Skin Breakdown?  No         Braden Prevention initiated:  No   Wound Care Orders initiated:  No      WOC nurse consulted for Pressure Injury (Stage 3,4, Unstageable, DTI, NWPT, and Complex wounds):  No      Nurse 1 eSignature: Electronically signed by Arlyne LULLA Bellman, RN on 03/27/24 at 5:42 AM EST    **SHARE this note so that the co-signing nurse is able to place an eSignature**    Nurse 2 eSignature: Electronically signed by Ammon Flicker, RN on 03/27/24 at 7:15 AM EST              "

## 2024-03-27 NOTE — Plan of Care (Signed)
"    Problem: Chronic Conditions and Co-morbidities  Goal: Patient's chronic conditions and co-morbidity symptoms are monitored and maintained or improved  Outcome: Progressing  Flowsheets (Taken 03/27/2024 2000)  Care Plan - Patient's Chronic Conditions and Co-Morbidity Symptoms are Monitored and Maintained or Improved:   Monitor and assess patient's chronic conditions and comorbid symptoms for stability, deterioration, or improvement   Collaborate with multidisciplinary team to address chronic and comorbid conditions and prevent exacerbation or deterioration   Update acute care plan with appropriate goals if chronic or comorbid symptoms are exacerbated and prevent overall improvement and discharge     Problem: Discharge Planning  Goal: Discharge to home or other facility with appropriate resources  Outcome: Progressing  Flowsheets (Taken 03/27/2024 2000)  Discharge to home or other facility with appropriate resources:   Identify barriers to discharge with patient and caregiver   Arrange for needed discharge resources and transportation as appropriate   Identify discharge learning needs (meds, wound care, etc)   Refer to discharge planning if patient needs post-hospital services based on physician order or complex needs related to functional status, cognitive ability or social support system     Problem: Safety - Adult  Goal: Free from fall injury  Outcome: Progressing  Flowsheets (Taken 03/27/2024 2000)  Free From Fall Injury:   Instruct family/caregiver on patient safety   Based on caregiver fall risk screen, instruct family/caregiver to ask for assistance with transferring infant if caregiver noted to have fall risk factors     Problem: Pain  Goal: Verbalizes/displays adequate comfort level or baseline comfort level  Outcome: Progressing     Problem: Nutrition Deficit:  Goal: Optimize nutritional status  Outcome: Progressing     "

## 2024-03-27 NOTE — Progress Notes (Signed)
"  Pt w/ sinus tachycardia and hypertensive; HR 140s and BP 170s/80s. Also found to have temperature of 100.4. she herself denies symptoms such as cough, SOB, headache, chest pain, abdominal pain, nausea, or other complaints. Lyndal does note some left hip discomfort which she states is from sleeping wrong on it.     Tachycardia could be reactive to dehydration and DKA given weight is ~160s down from baseline which appears to be 180s - 200s within the last few months. May be evidence of infection (although chest xr, urine w/o obvious source of infection).     Plan  - 500cc fluid bolus   - EKG  - covid/flu   - d-dimer with next blood tests ordered  "

## 2024-03-27 NOTE — Progress Notes (Signed)
"  Pt admitted to ICU. Pt is Aox4, moves all extremities, pt is on room air, and denies pain. Pt tolerated transfer well.     Pt arrives with shirt, pants, purse, socks and shoes.     [facility] residents notified of pt's arrival.   "

## 2024-03-27 NOTE — Progress Notes (Addendum)
"  Patient has been tachycardic since admission, but sustaining in 130s at this time. I notified resident team just so they are aware. She is asymptomatic.    Addendum 1230: Pt HR sustaining 140s, resident team messaged. States they will be by soon to assess.    Addendum 0200: Resident team at bedside to assess patient. No further intervention at this time besides electrolyte replacement. Patient remains on insulin  gtt. Routine EKG ordered per care team.  "

## 2024-03-27 NOTE — Progress Notes (Signed)
"  Pt HR in the 150s with BP 170s/80s, temp 100.4. I notified Dr. Elihu who was quick to bedside to assess patient (see prior note). Patient denies symptoms at this time. New orders placed. LR bolus initiated.  "

## 2024-03-27 NOTE — Progress Notes (Signed)
 "     ICU Progress Note    Admit Date: 03/26/2024  Day: 1  Vent Day: N/A  IV Access:Peripheral  IV Fluids:None  Vasopressors:None                Antibiotics: N/A  Diet: Diet NPO    CC:   Chief Complaint   Patient presents with    Shortness of Breath     C/o Shortness of breath that started this week     Headache     C/o headaches on and off x 6 months, base of neck up to left side of head     OTHER     Out of MEDS since September        Interval history:   Patient was seen at bedside in the AM. She is complaining pf a headache that has prevented her from getting sleep overnight. As per the patient, she was seen in the ED in the past for a similar headache and was given Excedrin  migraine which resolved her headache. One time dose given. Most recent anion gap is 19 from 28, once AG closed x 2, will transition to subQ insulin  and transfer out. No other AEON.    HPI:  [name] is a 38 y.o. female with a medical hx significant for type 2 diabetes, mixed hyperlipidemia, and mild hypertension, otherwise as listed in the MHx table below, who presented from home to the ED on 03/27/2024, here with headache and shortness of breath. Patient reportedly has not had health insurance coverage since September, and as a result has not been taking any of her medications.  She has been having 1 week history of difficulty breathing. She also has a 61-month history of headache that begins at the base of her neck and radiates to bilateral temples, left more than right. She has had previous hospitalizations for her headache and she was treated with pain medications and robaxin . She notes that this did not help very much. Patient also notes to have had increased polydipsia, and urinary frequency. Patient also had not checked her blood sugars recently, last time she checked was around August when sugars were ranging in 150s to high 250s. Patient notes she did not have any issues with medications when she was taking them. Denies  any changes in vision, photophobia, nausea, vomiting, motor weakness, sensory loss, chest pain, abdominal pain, hematuria or blood in stool.      On presentarion patient was found to be tachycardic and hypertensive, with a glucose of 418, anion gap metabolic acidosis. Elevated white count. Urine ketoacidosis. CXR was unremarkable.     Medications:     Scheduled Meds:   sodium chloride  flush  5-40 mL IntraVENous 2 times per day    enoxaparin   40 mg SubCUTAneous Daily    levothyroxine   75 mcg Oral Daily    pantoprazole  (PROTONIX ) 40 mg in sodium chloride  (PF) 0.9 % 10 mL injection  40 mg IntraVENous Daily     Continuous Infusions:   sodium chloride       dextrose  5 % and 0.45 % NaCl 150 mL/hr at 03/27/24 0421    insulin  10.4 Units/hr (03/27/24 0657)    dextrose       sodium chloride        PRN Meds:dextrose  bolus **OR** dextrose  bolus, potassium chloride , magnesium  sulfate, sodium phosphate  15 mmol in sodium chloride  0.9 % 250 mL IVPB, sodium chloride  flush, sodium chloride , potassium chloride  **OR** potassium alternative oral replacement **OR** potassium  chloride, magnesium  sulfate, ondansetron  **OR** ondansetron , polyethylene glycol, acetaminophen  **OR** acetaminophen , dextrose  5 % and 0.45 % NaCl, glucose, glucagon , dextrose , albuterol , HYDROmorphone  **OR** HYDROmorphone     Objective:   Vitals:   T-max:  Patient Vitals for the past 8 hrs:   BP Temp Temp src Pulse Resp SpO2 Weight   03/27/24 0750 (!) 158/64 -- -- (!) 127 18 99 % --   03/27/24 0738 -- -- -- -- 19 -- --   03/27/24 0730 (!) 163/71 -- -- (!) 123 20 -- --   03/27/24 0600 -- -- -- -- -- -- 75.9 kg (167 lb 5.3 oz)   03/27/24 0400 -- 98 F (36.7 C) Temporal -- -- -- --   03/27/24 0200 (!) 152/72 98.2 F (36.8 C) Oral (!) 122 19 99 % --   03/27/24 0130 (!) 151/71 -- -- (!) 120 18 100 % --   03/27/24 0100 (!) 166/73 -- -- (!) 120 19 100 % --   03/27/24 0030 (!) 161/79 -- -- (!) 124 18 100 % --   03/27/24 0000 (!) 169/85 -- -- (!) 129 22 100 % --        Intake/Output Summary (Last 24 hours) at 03/27/2024 0754  Last data filed at 03/27/2024 9367  Gross per 24 hour   Intake 1028.48 ml   Output --   Net 1028.48 ml       Review of Systems  As per HPI.    Physical Exam  Constitutional:       General: She is in acute distress.   HENT:      Head: Normocephalic and atraumatic.   Eyes:      Extraocular Movements: Extraocular movements intact.      Conjunctiva/sclera: Conjunctivae normal.   Cardiovascular:      Rate and Rhythm: Regular rhythm. Tachycardia present.      Heart sounds: Normal heart sounds.   Pulmonary:      Effort: Pulmonary effort is normal.      Breath sounds: Normal breath sounds.   Abdominal:      Tenderness: There is no abdominal tenderness. There is no guarding.   Musculoskeletal:         General: Normal range of motion.   Skin:     General: Skin is warm and dry.   Neurological:      General: No focal deficit present.      Mental Status: She is alert and oriented to person, place, and time.   Psychiatric:         Mood and Affect: Mood normal.         Behavior: Behavior normal.         Thought Content: Thought content normal.         Judgment: Judgment normal.           LABS:    CBC:   Recent Labs     03/26/24  2331   WBC 22.7*   HGB 10.9*   HCT 34.8*   PLT 503*   MCV 80.0     Renal:    Recent Labs     03/26/24  2331 03/27/24  0347   NA 128* 130*   K 4.2 3.3*   CL 92* 99   CO2 8* 12*   BUN 7 5*   CREATININE 0.9 0.6   GLUCOSE 418* 245*   CALCIUM  10.4 9.0   MG  --  1.82   PHOS  --  2.7   ANIONGAP 28*  19*     Hepatic: No results for input(s): AST, ALT, BILITOT, BILIDIR, LABALBU, ALKPHOS in the last 72 hours.    Invalid input(s): PROT  Troponin: No results for input(s): TROPONINI in the last 72 hours.  BNP: No results for input(s): BNP in the last 72 hours.  Lipids: No results for input(s): CHOL, HDL in the last 72 hours.    Invalid input(s): LDLCALCU, TRIGLYCERIDE  ABGs:  No results for input(s): PHART, PCO2ART, PO2ART,  HCO3ART, BEART, THGBART, O2SATART, TCO2ART in the last 72 hours.    INR: No results for input(s): INR in the last 72 hours.  Lactate:   Recent Labs     03/27/24  0625   LACTATE 0.75     Cultures:  -----------------------------------------------------------------  RAD:   XR CHEST PORTABLE   Final Result      Clear lungs.      Normal cardiac mediastinal silhouette.      Electronically signed by Shelly Schwalbe, MD            Assessment/Plan:   [name] is a 38 y.o. female with a medical hx significant for type 2 diabetes, mixed hyperlipidemia, and mild hypertension, otherwise as listed in the MHx table below, who presented from home to the ED on 03/27/2024, here with headache and shortness of breath.      Diabetic Ketoacidosis Type 2   High Anion Gap Metabolic Acidosis   Headache   Elevated White Count   Patient with poorly controlled type 2 diabetes mellitus presenting with diabetic ketoacidosis (DKA), evidenced by hyperglycemia, elevated anion gap metabolic acidosis, ketonuria, and low bicarbonate. Delta gap is between 1-2, meaning pure, uncomplicated HAGMA. DKA is most likely secondary to medication nonadherence due to lack of insurance coverage. Patient also reports headache, likely related to metabolic derangements from DKA and generalized medication noncompliance, including antihypertensive therapy. Leukocytosis is present and is suspected to be reactive in the setting of DKA, with no clear infectious source identified at this time. UA looks unremarkable for infective and CXR similarly is unremarkable.   IVF   Insulin    Hypoglycemia protocol   POCT glucose   BMP q 4   Mg q4   Phos q4   CBC   Beta-hydroxybutyrate   A1c   Urine Culture Pending    Inpatient consult to DM educator/mgmt team   Telemetry      Chronic Medical Conditions:  Hypothyroidism   Continue Levothyroxine       Hypertension   Will hold Losartan  in the setting of slight inc in cr from baseline on presentation. Will treat  DKA first      Hyperlipidemia   Simvastatin  not given tonight as patient Npo      Code Status: Full Code   PPX: Lovenox   DISPO: ICU    Leni Hildegard Lillette Amanda, MD, PGY-3  Internal Medicine Resident  Contact via [facility]  03/27/24  7:54 AM    This patient has been staffed and discussed with Shelly Richard.       PULMONARY AND CRITICAL CARE ATTENDING:  Patient was seen, examined and discussed with Dr. Hildegard PGY-3. I agree with the history of present illness, past medical/surgical histories, family history, social history, medication list and allergies as listed. The review of systems is as noted above. My physical exam confirms the findings listed above.   Chart was reviewed including Labs, CXR, CT scan, EKG, and Medical records confirm the findings noted above.   I edited the note where  appropriate.    Briefly, this is a 38 y.o. female admitted to ICU with DKA  Reported recently became uninsured and this resulted in inability to procure medications leading to DKA.      BP (!) 152/68   Pulse (!) 128   Temp 98.7 F (37.1 C) (Oral)   Resp 18   Ht 1.626 m (5' 4)   Wt 75.9 kg (167 lb 5.3 oz)   LMP 03/23/2024   SpO2 99%   BMI 28.72 kg/m     Intake/Output Summary (Last 24 hours) at 03/27/2024 0856  Last data filed at 03/27/2024 9367  Gross per 24 hour   Intake 1028.48 ml   Output --   Net 1028.48 ml    SpO2: 99 %  -       Peripheral IV 03/26/24 Left;Proximal Forearm (Active)   Number of days: 0       Peripheral IV 03/27/24 Right Antecubital (Active)   Number of days: 0       ASSESSMENT:  DKA  Social health deterrants - uninsured    PLAN:  Provide O2 supplementation to keep SpO2 between 88-92% if needed.    DKA protocol  Advance diet as tolerated  ICU Glycemic goal 140-180 mg/dL  Diet: carb controlled  IVF: 1/2NS  GI prophylaxis: elevate HOB 30 degrees, PPI  DVT prophylaxis: LMWH  Bowel regimen: N/A  Abx: N/A    Pt has a high probability of imminent or life-threatening deterioration requiring close monitoring, and  highly complex decision-making and/or interventions of high intensity to assess, manipulate, and support his critical organ systems to prevent a likely inevitable decline which could occur if left untreated.      A total critical care time 31 minutes was used. This includes but not limited to examining patient, collaborating with other physicians, monitoring vital signs, telemetry, continuous pulse oximetry, and clinical response to IV medications, documentation time, review and interpretation of laboratory and radiological data, review of nursing notes and old record review. This time excludes any time that may have been spent performing procedures for life threatening organ failure.    The note was completed using EMR and Dragon dictation system. Every effort was made to ensure accuracy; however, inadvertent computerized transcription errors may be present.     [facility] Richard Hilts MD  Pulmonary and Critical Care Medicine    "

## 2024-03-27 NOTE — Plan of Care (Signed)
 Problem: Chronic Conditions and Co-morbidities  Goal: Patient's chronic conditions and co-morbidity symptoms are monitored and maintained or improved  Outcome: Progressing     Problem: Discharge Planning  Goal: Discharge to home or other facility with appropriate resources  Outcome: Progressing     Problem: Safety - Adult  Goal: Free from fall injury  Outcome: Progressing

## 2024-03-27 NOTE — H&P (Signed)
 "    ICU HISTORY & PHYSICAL       Admit Date:  03/26/2024                            Hospital Day: 1  ICU Day: 1    CC: Headache and shortness of breath    History obtained from:  chart review and the patient    SUBJECTIVE   HPI:    [name] is a 38 y.o. female with a medical hx significant for type 2 diabetes, mixed hyperlipidemia, and mild hypertension, otherwise as listed in the MHx table below, who presented from home to the ED on 03/27/2024, here with headache and shortness of breath. Patient reportedly has not had health insurance coverage since September, and as a result has not been taking any of her medications.  She has been having 1 week history of difficulty breathing. She also has a 85-month history of headache that begins at the base of her neck and radiates to bilateral temples, left more than right. She has had previous hospitalizations for her headache and she was treated with pain medications and robaxin . She notes that this did not help very much. Patient also notes to have had increased polydipsia, and urinary frequency. Patient also had not checked her blood sugars recently, last time she checked was around August when sugars were ranging in 150s to high 250s. Patient notes she did not have any issues with medications when she was taking them. Denies any changes in vision, photophobia, nausea, vomiting, motor weakness, sensory loss, chest pain, abdominal pain, hematuria or blood in stool.     On presentarion patient was found to be tachycardic and hypertensive, with a glucose of 418, anion gap metabolic acidosis. Elevated white count. Urine ketoacidosis. CXR was unremarkable.     Past Medical History:   Diagnosis Date    Acute headache     Allergies     Asthma     Diabetes mellitus (HCC)     Genital herpes     HTN (hypertension)     Hyperlipidemia     Hypothyroid     Urticaria      Past Surgical History:   Procedure Laterality Date    CYST REMOVAL      HYSTEROSCOPY      x2     MYOMECTOMY N/A 02/01/2022    ROBOTIC MYOMECTOMY performed by Foley, Chauncey Sharper, MD at [facility] OR     Social History:   Housing: From home   Occupation: Dentist   Alcohol: None   Illicit drugs: N/A  Tobacco: None     Family History   Problem Relation Age of Onset    Diabetes Mother     Diabetes Father      Allergies:   Allergies   Allergen Reactions    Ace Inhibitors Swelling    Lisinopril Other (See Comments)     Swollen lips    Peanuts [Peanut Oil]      THROAT ITCHES  Walnuts  PEANUTS     Walnut      Review of Systems   Constitutional:  Negative for chills and fever.   Eyes:  Negative for photophobia and visual disturbance.   Respiratory:  Negative for shortness of breath.    Cardiovascular:  Negative for chest pain.   Gastrointestinal:  Negative for abdominal pain.   Endocrine: Positive for polydipsia and polyuria.   Genitourinary:  Negative  for dysuria, flank pain and hematuria.   Neurological:  Negative for tremors, weakness, light-headedness and headaches.         OBJECTIVE   MEDICATION:  Home meds:  Prior to Admission medications   Medication Sig Start Date End Date Taking? Authorizing Provider   Blood Glucose Monitoring Suppl (ACURA BLOOD GLUCOSE METER) w/Device KIT Please provide meter approved by insurance.  Check bs qam 02/16/18  Yes Tussey, Rocio G, MD   insulin  detemir (LEVEMIR  FLEXPEN) 100 UNIT/ML injection pen INJECT 20 UNITS UNDER THE SKIN ONCE NIGHTLY  Patient not taking: Reported on 03/26/2024 12/02/23   Alexandro Bernice BRAVO, [name]   Continuous Glucose Sensor (FREESTYLE LIBRE 2 SENSOR) MISC APPLY SENSOR TO SKIN FOR CONTINUOUS BLOOD SUGAR MONITORING, REPLACE EVERY 14 DAYS  Patient not taking: Reported on 03/27/2024 12/02/23   Alexandro Bernice BRAVO, [name]   insulin  lispro, 1 Unit Dial , (HUMALOG /ADMELOG ) 100 UNIT/ML SOPN INJECT 10 UNITS UNDER THE SKIN WITH MEALS PLUS INJECT 2 UNITS FOR BLOOD SUGAR  150-200, 4 UNITS IF 201-250, 6 UNITS IF 251-300, 8 UNITS IF 301-350, 10 UNITS IF GREATER THAN  350  Patient not taking: Reported on 03/26/2024 12/02/23   Alexandro Bernice BRAVO, [name]   Semaglutide , 1 MG/DOSE, (OZEMPIC , 1 MG/DOSE,) 4 MG/3ML SOPN sc injection Inject 1 mg into the skin every 7 days  Patient not taking: Reported on 03/26/2024 12/02/23   Alexandro Bernice BRAVO, [name]   Cholecalciferol (VITAMIN D3) 50 MCG (2000 UT) TABS TAKE ONE TABLET BY MOUTH DAILY  Patient not taking: Reported on 03/27/2024 11/26/23   Alexandro Bernice BRAVO, [name]   ferrous sulfate  (FEROSUL) 325 (65 Fe) MG tablet Take 1 tablet by mouth daily (with breakfast)  Patient not taking: Reported on 03/27/2024 11/26/23   Alexandro Bernice BRAVO, [name]   metFORMIN  (GLUCOPHAGE -XR) 500 MG extended release tablet Take 4 tablets by mouth daily (with breakfast)  Patient not taking: Reported on 03/26/2024 11/25/23   Alexandro Bernice BRAVO, [name]   insulin  glargine (LANTUS  SOLOSTAR) 100 UNIT/ML injection pen Inject 40 Units into the skin nightly  Patient not taking: Reported on 03/26/2024 04/29/23   Osterbrock, Erika E, [name]   Insulin  Pen Needle (DROPLET PEN NEEDLES) 32G X 6 MM MISC USE ONCE DAILY  Patient not taking: Reported on 03/27/2024 07/09/22   Alexandro Bernice BRAVO, [name]   dapagliflozin  (FARXIGA ) 10 MG tablet Take 1 tablet by mouth every morning  Patient not taking: Reported on 03/26/2024 05/22/22   Osterbrock, Erika E, [name]   simvastatin  (ZOCOR ) 40 MG tablet Take 1 tablet by mouth daily  Patient not taking: Reported on 03/26/2024 05/22/22   Alexandro Bernice BRAVO, [name]   cetirizine  (ZYRTEC ) 10 MG tablet Take 1 tablet by mouth daily  Patient not taking: Reported on 03/26/2024 05/22/22   Alexandro Bernice BRAVO, [name]   levothyroxine  (SYNTHROID ) 75 MCG tablet TAKE ONE TABLET BY MOUTH DAILY  Patient not taking: Reported on 03/27/2024 05/22/22   Alexandro Bernice BRAVO, [name]   montelukast  (SINGULAIR ) 10 MG tablet Take 1 tablet by mouth daily  Patient not taking: Reported on 03/26/2024 05/22/22   Alexandro Bernice BRAVO, APRN -  CNP   furosemide  (LASIX ) 20 MG tablet Take 1 tablet by mouth daily as needed (swelling)  Patient not taking: Reported on 03/27/2024 05/22/22   Alexandro Bernice BRAVO, [name]   ibuprofen  (ADVIL ;MOTRIN ) 600 MG  tablet Take 1 tablet by mouth every 6 hours as needed for Pain 02/01/22 11/25/23  Foley, Chauncey Sharper, MD   acetaminophen  (TYLENOL ) 500 MG tablet Take 1 tablet by mouth 4 times daily as needed for Pain 02/01/22 11/25/23  Foley, Chauncey Sharper, MD   Continuous Blood Gluc Sensor (FREESTYLE LIBRE 2 SENSOR) MISC USE TO TEST BLOOD GLUCOSE AS DIRECTED  Patient not taking: Reported on 03/27/2024 12/25/21   Alexandro Bernice BRAVO, [name]   letrozole (FEMARA) 2.5 MG tablet  06/06/21   [provider]   losartan  (COZAAR ) 25 MG tablet TAKE ONE TABLET BY MOUTH DAILY  Patient not taking: Reported on 03/26/2024 07/05/21   Osterbrock, Erika E, [name]   Insulin  Pen Needle 32G X 6 MM MISC Use four times daily and as needed  Patient not taking: Reported on 03/27/2024 06/12/21   Osterbrock, Erika E, [name]   EPINEPHrine (EPIPEN) 0.3 MG/0.3ML SOAJ injection  04/05/21   [provider]   blood glucose monitor strips Tes2t qam  times a day & as needed for symptoms of irregular blood glucose. Please provide test strips approved by insurance.  Patient not taking: Reported on 03/27/2024 12/29/19   Alexandro Bernice BRAVO, [name]   albuterol  sulfate HFA 108 (90 Base) MCG/ACT inhaler Inhale 2 puffs into the lungs every 6 hours as needed for Wheezing or Shortness of Breath 04/26/19   Alexandro Bernice BRAVO, [name]   Insulin  Pen Needle ([employer] PEN NEEDLES 31G) 31G X 8 MM MISC 1 each by Does not apply route daily  Patient not taking: Reported on 03/27/2024 02/10/19   Alexandro Bernice BRAVO, [name]   valACYclovir  (VALTREX ) 500 MG tablet TAKE 1 TABLET BY MOUTH DAILY  Patient not taking: Reported on 11/15/2023 03/03/18   Seena JAYSON Eck, MD   Lancets MISC 1 each by Does not apply route daily Please provide lancets approved by  insurance.  Patient not taking: Reported on 03/27/2024 02/16/18   Esmeralda Lazaro MATSU, MD      Scheduled:    sodium chloride  flush  5-40 mL IntraVENous 2 times per day    enoxaparin   40 mg SubCUTAneous Daily    sodium chloride   15 mL/kg IntraVENous Once    levothyroxine   75 mcg Oral Daily     Continuous Infusions:    sodium chloride       sodium chloride       dextrose  5 % and 0.45 % NaCl      insulin  6.7 Units/hr (03/27/24 0310)    dextrose          Vitals:    03/27/24 0030 03/27/24 0100 03/27/24 0130 03/27/24 0200   BP: (!) 161/79 (!) 166/73 (!) 151/71 (!) 152/72   Pulse: (!) 124 (!) 120 (!) 120 (!) 122   Resp: 18 19 18 19    Temp:    98.2 F (36.8 C)   TempSrc:    Oral   SpO2: 100% 100% 100% 99%   Weight:       Height:         PHYSICAL EXAM:  Physical Exam  Constitutional:       General: She is not in acute distress.     Appearance: She is not ill-appearing, toxic-appearing or diaphoretic.   Cardiovascular:      Rate and Rhythm: Normal rate and regular rhythm.      Pulses: Normal pulses.      Heart sounds: Normal heart sounds. No murmur  heard.     No friction rub. No gallop.   Pulmonary:      Effort: Pulmonary effort is normal. No respiratory distress.      Breath sounds: Normal breath sounds. No stridor. No wheezing, rhonchi or rales.   Chest:      Chest wall: No tenderness.   Abdominal:      General: There is no distension.      Palpations: Abdomen is soft. There is no mass.      Tenderness: There is no abdominal tenderness. There is no guarding or rebound.      Hernia: No hernia is present.   Musculoskeletal:      Right lower leg: No edema.      Left lower leg: No edema.   Skin:     General: Skin is warm.   Neurological:      Mental Status: She is alert and oriented to person, place, and time.         LABS:  VBGs:    Recent Labs     03/26/24  2331   PHVEN 7.196*   PCO2VEN 28.5*   PO2VEN <30.0     Recent Labs     03/26/24  2331   WBC 22.7*   HGB 10.9*   HCT 34.8*   PLT 503*       Recent Labs     03/26/24  2331   NA  128*   K 4.2   CL 92*   CO2 8*   BUN 7   CREATININE 0.9   GLUCOSE 418*     UA/micro:   Recent Labs     03/26/24  2343   COLORU Yellow   PHUR 6.0   WBCUA 0-2   RBCUA >100*   BACTERIA 1+*   CLARITYU Clear   LEUKOCYTESUR Negative   UROBILINOGEN 0.2   BILIRUBINUR MODERATE*   BLOODU MODERATE*   GLUCOSEU 250*     Micro:   Results       Procedure Component Value Units Date/Time    Urine Culture [[id number]] Collected: 03/26/24 2343    Order Status: Sent Specimen: Urine voided            RADIOLOGY:  XR CHEST PORTABLE   Final Result      Clear lungs.      Normal cardiac mediastinal silhouette.      Electronically signed by Lynwood Schwalbe, MD        ASSESSMENT & PLAN   [name] is a 38 y.o. female with a medical hx significant for type 2 diabetes, mixed hyperlipidemia, and mild hypertension, otherwise as listed in the MHx table below, who presented from home to the ED on 03/27/2024, here with headache and shortness of breath.     Diabetic Ketoacidosis Type 2   High Anion Gap Metabolic Acidosis   Headache   Elevated White Count   Patient with poorly controlled type 2 diabetes mellitus presenting with diabetic ketoacidosis (DKA), evidenced by hyperglycemia, elevated anion gap metabolic acidosis, ketonuria, and low bicarbonate. Delta gap is between 1-2, meaning pure, uncomplicated HAGMA. DKA is most likely secondary to medication nonadherence due to lack of insurance coverage. Patient also reports headache, likely related to metabolic derangements from DKA and generalized medication noncompliance, including antihypertensive therapy. Leukocytosis is present and is suspected to be reactive in the setting of DKA, with no clear infectious source identified at this time. UA looks unremarkable for infective and CXR similarly is unremarkable.   IVF  Insulin    Hypoglycemia protocol   POCT glucose   BMP q 4   Mg q4   Phos q4   CBC   Beta-hydroxybutyrate   A1c   Urine Culture Pending    Inpatient consult to DM educator/mgmt  team   Telemetry     Chronic Medical Conditions:  Hypothyroidism   Continue Levothyroxine      Hypertension   Will hold Losartan  in the setting of slight inc in cr from baseline on presentation. Will treat DKA first     Hyperlipidemia   Simvastatin  not given tonight as patient Npo      Glycemic goal:  140-180 mg/dL  LDAs: R and L peripheral   Infusions: Normal saline and Insulin  regular   Abx: None   Diet:  Diet NPO  GI PPx:  Protonix  40 mg IV qd  Bowel Regimen: Glycolax   DVT PPx: Lovenox        Code Status:  Full Code  Disposition:   Prior to admission:  From Home   Current:  ICU  At discharge:  TBD    Enrigue Buckles, MD, PGY-1  Internal Medicine Resident  Contact via [employer]     "

## 2024-03-27 NOTE — Progress Notes (Signed)
"    Comprehensive Nutrition Assessment    RECOMMENDATIONS:  PO Diet: Continue Regular, CCC-3  Nutrition Supplement: No current indication  Nutrition Education: Education/Counseling needed     NUTRITION ASSESSMENT:   Nutritional summary & status: Positive screen for wt loss. Presented to ED w/ SOB and headache; found to be in DKA due to not checking blood glucose levels and unable to take medications since pt has been w/o health insurance since 12/2023 per H&P. Per wt hx in EMR, pt has had clinically significant wt loss of 8% (~15 lb) over past 4 months. Upon visit, pt reports no decrease in appetite or intake over past few months. Endorses good appetite currently as well. Suspect wt loss may be r/t untreated DM. Will plan to provide carb controlled nutrition education as needed. Pt just started on diet earlier today; no documented PO intake in EMR yet. Will continue to monitor.   Admission // PMH: DKA, type 2, not at goal // T2DM, mixed HLD, HTN, hypothyroidism    MALNUTRITION ASSESSMENT  Context of Malnutrition: Chronic Illness   Malnutrition Status: At risk for malnutrition  Findings of the 6 clinical characteristics of malnutrition (Minimum of 2 out of 6 clinical characteristics is required to make the diagnosis of moderate or severe Protein Calorie Malnutrition based on AND/ASPEN Guidelines):  Energy Intake:  No decrease in energy intake  Weight Loss:  7.5% over 3 months (8% over 4 months)     Body Fat Loss:  No body fat loss     Muscle Mass Loss:  No muscle mass loss    Fluid Accumulation:  No fluid accumulation    Grip Strength:  Not Performed    NUTRITION DIAGNOSIS   Altered nutrition-related lab values related to endocrine dysfunction as evidenced by lab values    Nutrition Monitoring and Evaluation:   Food/Nutrient Intake Outcomes:  Food and Nutrient Intake  Physical Signs/Symptoms Outcomes:  Biochemical Data, Nutrition Focused Physical Findings, Weight     OBJECTIVE DATA: Significant to nutrition  assessment  Nutrition Related Findings: LBM pta; glycolax  PRN. Labs reviewed; Na+ 127. Mg 1.59, Phos 1.9: both replaced. POCG 205-416.  Wounds: None  Nutrition Goals: PO intake 50% or greater, by next RD assessment     CURRENT NUTRITION THERAPIES  ADULT DIET; Regular; 3 carb choices (45 gm/meal)  PO Intake: Unable to assess   PO Supplement Intake:None Ordered  Additional Sources of Calories/IVF:Insulin  regular 100 units in NS 100 mL     COMPARATIVE STANDARDS  Energy (kcal):  1518 - 1746 (20 - 23 kcal/kg CBW)     Protein (g):  65 - 87 (1.2 - 1.6 gm/kg IBW)       Fluid (ml/day):  1 ml/kcal    ANTHROPOMETRICS  Current Height: 162.6 cm (5' 4)  Current Weight - Scale: 75.9 kg (167 lb 5.3 oz)    Admission weight: 75.7 kg (166 lb 12.8 oz)    The patient will be monitored per nutrition standards of care. Consult dietitian if additional nutrition interventions are needed prior to RD reassessment.     Madyson King, [place]  [employer]:  [phone]       "

## 2024-03-27 NOTE — ED Notes (Signed)
"  Handoff given to ICU RN     Ventura Mussel, RN  03/27/24 [phone]    "

## 2024-03-28 ENCOUNTER — Inpatient Hospital Stay: Admit: 2024-03-28 | Primary: Oncology

## 2024-03-28 ENCOUNTER — Inpatient Hospital Stay: Admit: 2024-03-28 | Payer: Medicaid (Managed Care) | Primary: Oncology

## 2024-03-28 LAB — MAGNESIUM
Magnesium: 1.93 mg/dL (ref 1.80–2.40)
Magnesium: 1.9 mg/dL (ref 1.80–2.40)
Magnesium: 1.75 mg/dL — ABNORMAL LOW (ref 1.80–2.40)

## 2024-03-28 LAB — POCT GLUCOSE
POC Glucose: 139 mg/dL — ABNORMAL HIGH (ref 70–99)
POC Glucose: 143 mg/dL — ABNORMAL HIGH (ref 70–99)
POC Glucose: 144 mg/dL — ABNORMAL HIGH (ref 70–99)
POC Glucose: 149 mg/dL — ABNORMAL HIGH (ref 70–99)
POC Glucose: 151 mg/dL — ABNORMAL HIGH (ref 70–99)
POC Glucose: 154 mg/dL — ABNORMAL HIGH (ref 70–99)
POC Glucose: 159 mg/dL — ABNORMAL HIGH (ref 70–99)
POC Glucose: 168 mg/dL — ABNORMAL HIGH (ref 70–99)
POC Glucose: 177 mg/dL — ABNORMAL HIGH (ref 70–99)
POC Glucose: 177 mg/dL — ABNORMAL HIGH (ref 70–99)
POC Glucose: 177 mg/dL — ABNORMAL HIGH (ref 70–99)
POC Glucose: 177 mg/dL — ABNORMAL HIGH (ref 70–99)
POC Glucose: 179 mg/dL — ABNORMAL HIGH (ref 70–99)
POC Glucose: 180 mg/dL — ABNORMAL HIGH (ref 70–99)
POC Glucose: 183 mg/dL — ABNORMAL HIGH (ref 70–99)
POC Glucose: 184 mg/dL — ABNORMAL HIGH (ref 70–99)
POC Glucose: 184 mg/dL — ABNORMAL HIGH (ref 70–99)
POC Glucose: 191 mg/dL — ABNORMAL HIGH (ref 70–99)
POC Glucose: 198 mg/dL — ABNORMAL HIGH (ref 70–99)
POC Glucose: 202 mg/dL — ABNORMAL HIGH (ref 70–99)
POC Glucose: 210 mg/dL — ABNORMAL HIGH (ref 70–99)
POC Glucose: 210 mg/dL — ABNORMAL HIGH (ref 70–99)
POC Glucose: 211 mg/dL — ABNORMAL HIGH (ref 70–99)
POC Glucose: 251 mg/dL — ABNORMAL HIGH (ref 70–99)

## 2024-03-28 LAB — POTASSIUM: Potassium: 3.7 mmol/L (ref 3.5–5.1)

## 2024-03-28 LAB — BASIC METABOLIC PANEL
Anion Gap: 11 (ref 3–16)
Anion Gap: 13 (ref 3–16)
Anion Gap: 12 (ref 3–16)
Anion Gap: 13 (ref 3–16)
BUN: <2 mg/dL — ABNORMAL LOW (ref 7–20)
BUN: <2 mg/dL — ABNORMAL LOW (ref 7–20)
BUN: 3 mg/dL — ABNORMAL LOW (ref 7–20)
BUN: 3 mg/dL — ABNORMAL LOW (ref 7–20)
CO2: 19 mmol/L — ABNORMAL LOW (ref 21–32)
CO2: 18 mmol/L — ABNORMAL LOW (ref 21–32)
CO2: 19 mmol/L — ABNORMAL LOW (ref 21–32)
CO2: 20 mmol/L — ABNORMAL LOW (ref 21–32)
Calcium: 8.9 mg/dL (ref 8.3–10.6)
Calcium: 8.9 mg/dL (ref 8.3–10.6)
Calcium: 8.2 mg/dL — ABNORMAL LOW (ref 8.3–10.6)
Calcium: 8.5 mg/dL (ref 8.3–10.6)
Chloride: 101 mmol/L (ref 99–110)
Chloride: 100 mmol/L (ref 99–110)
Chloride: 101 mmol/L (ref 99–110)
Chloride: 101 mmol/L (ref 99–110)
Creatinine: 0.6 mg/dL (ref 0.6–1.1)
Creatinine: 0.6 mg/dL (ref 0.6–1.1)
Creatinine: 0.6 mg/dL (ref 0.6–1.1)
Creatinine: 0.6 mg/dL (ref 0.6–1.1)
Est, Glom Filt Rate: >90
Est, Glom Filt Rate: >90
Est, Glom Filt Rate: >90
Est, Glom Filt Rate: >90
Glucose: 167 mg/dL — ABNORMAL HIGH (ref 70–99)
Glucose: 190 mg/dL — ABNORMAL HIGH (ref 70–99)
Glucose: 151 mg/dL — ABNORMAL HIGH (ref 70–99)
Glucose: 154 mg/dL — ABNORMAL HIGH (ref 70–99)
Potassium: 3.8 mmol/L (ref 3.5–5.1)
Potassium: 3.9 mmol/L (ref 3.5–5.1)
Potassium: 3.7 mmol/L (ref 3.5–5.1)
Potassium: 4 mmol/L (ref 3.5–5.1)
Sodium: 131 mmol/L — ABNORMAL LOW (ref 136–145)
Sodium: 131 mmol/L — ABNORMAL LOW (ref 136–145)
Sodium: 133 mmol/L — ABNORMAL LOW (ref 136–145)
Sodium: 133 mmol/L — ABNORMAL LOW (ref 136–145)

## 2024-03-28 LAB — URINALYSIS WITH REFLEX TO CULTURE
Bilirubin, Urine: NEGATIVE
Glucose, Ur: NEGATIVE mg/dL
Ketones, Urine: NEGATIVE mg/dL
Nitrite, Urine: NEGATIVE
Protein, UA: 30 mg/dL — AB
Specific Gravity, UA: 1.015 (ref 1.005–1.030)
Urobilinogen, Urine: 0.2 U/dL (ref <2.0)
pH, Urine: 6 (ref 5.0–8.0)

## 2024-03-28 LAB — URINE DRUG SCREEN
Amphetamine Screen, Ur: NEGATIVE
Barbiturate Screen, Ur: NEGATIVE (ref <200)
Benzodiazepine Screen, Urine: NEGATIVE (ref <200)
Cannabinoid Scrn, Ur: NEGATIVE (ref <50)
Cocaine Metabolite Screen, Urine: NEGATIVE (ref <300)
FENTANYL SCREEN, URINE: NEGATIVE (ref <5)
Methadone Screen, Urine: NEGATIVE (ref <300)
Opiate Screen, Urine: NEGATIVE (ref <300)
Oxycodone Urine: NEGATIVE (ref <100)
Phencyclidine (PCP), Screen, Urine: NEGATIVE (ref <25)
pH, Urine: 5

## 2024-03-28 LAB — CBC WITH AUTO DIFFERENTIAL
Atypical Lymphocytes Relative: 1 % (ref 0–6)
Bands Relative: 5 % (ref 0–7)
Basophils %: 0 %
Basophils %: 0 %
Basophils Absolute: 0 10*3/uL (ref 0.0–0.2)
Basophils Absolute: 0 K/uL (ref 0.0–0.2)
Eosinophils %: 0 %
Eosinophils %: 0 %
Eosinophils Absolute: 0 10*3/uL (ref 0.0–0.6)
Eosinophils Absolute: 0 K/uL (ref 0.0–0.6)
Hematocrit: 27.3 % — ABNORMAL LOW (ref 36.0–48.0)
Hematocrit: 26.9 % — ABNORMAL LOW (ref 36.0–48.0)
Hemoglobin: 8.7 g/dL — ABNORMAL LOW (ref 12.0–16.0)
Hemoglobin: 8.7 g/dL — ABNORMAL LOW (ref 12.0–16.0)
Lymphocytes %: 11 %
Lymphocytes %: 20 %
Lymphocytes Absolute: 1.9 10*3/uL (ref 1.0–5.1)
Lymphocytes Absolute: 3.3 K/uL (ref 1.0–5.1)
MCH: 24.9 pg — ABNORMAL LOW (ref 26.0–34.0)
MCH: 25.3 pg — ABNORMAL LOW (ref 26.0–34.0)
MCHC: 31.8 g/dL (ref 31.0–36.0)
MCHC: 32.3 g/dL (ref 31.0–36.0)
MCV: 78.2 fL — ABNORMAL LOW (ref 80.0–100.0)
MCV: 78.2 fL — ABNORMAL LOW (ref 80.0–100.0)
MPV: 8.6 fL (ref 5.0–10.5)
MPV: 8.2 fL (ref 5.0–10.5)
Monocytes %: 3 %
Monocytes %: 11 %
Monocytes Absolute: 0.5 10*3/uL (ref 0.0–1.3)
Monocytes Absolute: 1.8 K/uL — ABNORMAL HIGH (ref 0.0–1.3)
Neutrophils %: 80 %
Neutrophils %: 69 %
Neutrophils Absolute: 13.7 10*3/uL — ABNORMAL HIGH (ref 1.7–7.7)
Neutrophils Absolute: 11.3 K/uL — ABNORMAL HIGH (ref 1.7–7.7)
PLATELET SLIDE REVIEW: INCREASED
PLATELET SLIDE REVIEW: INCREASED
Platelets: 491 10*3/uL — ABNORMAL HIGH (ref 135–450)
Platelets: 455 K/uL — ABNORMAL HIGH (ref 135–450)
RBC: 3.49 M/uL — ABNORMAL LOW (ref 4.00–5.20)
RBC: 3.44 M/uL — ABNORMAL LOW (ref 4.00–5.20)
RDW: 16.4 % — ABNORMAL HIGH (ref 12.4–15.4)
RDW: 16.4 % — ABNORMAL HIGH (ref 12.4–15.4)
WBC: 16.1 10*3/uL — ABNORMAL HIGH (ref 4.0–11.0)
WBC: 16.4 K/uL — ABNORMAL HIGH (ref 4.0–11.0)

## 2024-03-28 LAB — RENAL FUNCTION PANEL
Albumin: 2.8 g/dL — ABNORMAL LOW (ref 3.4–5.0)
Anion Gap: 11 (ref 3–16)
BUN: 4 mg/dL — ABNORMAL LOW (ref 7–20)
CO2: 21 mmol/L (ref 21–32)
Calcium: 8.8 mg/dL (ref 8.3–10.6)
Chloride: 100 mmol/L (ref 99–110)
Creatinine: 0.6 mg/dL (ref 0.6–1.1)
Est, Glom Filt Rate: >90
Glucose: 157 mg/dL — ABNORMAL HIGH (ref 70–99)
Phosphorus: 1.7 mg/dL — ABNORMAL LOW (ref 2.5–4.9)
Potassium: 3.6 mmol/L (ref 3.5–5.1)
Sodium: 132 mmol/L — ABNORMAL LOW (ref 136–145)

## 2024-03-28 LAB — TROPONIN: Troponin, High Sensitivity: <6 ng/L (ref 0–14)

## 2024-03-28 LAB — T4, FREE: T4 Free: 1.3 ng/dL (ref 0.9–1.8)

## 2024-03-28 LAB — LACTIC ACID
Lactic Acid: 2.4 mmol/L — ABNORMAL HIGH (ref 0.4–2.0)
Lactic Acid: 2.1 mmol/L — ABNORMAL HIGH (ref 0.4–2.0)

## 2024-03-28 LAB — TSH REFLEX TO FT4: TSH Reflex FT4: 0.25 u[IU]/mL — ABNORMAL LOW (ref 0.27–4.20)

## 2024-03-28 LAB — PHOSPHORUS
Phosphorus: 2.2 mg/dL — ABNORMAL LOW (ref 2.5–4.9)
Phosphorus: 2 mg/dL — ABNORMAL LOW (ref 2.5–4.9)

## 2024-03-28 LAB — MICROSCOPIC URINALYSIS

## 2024-03-28 MED ORDER — STERILE WATER FOR INJECTION (MIXTURES ONLY)
1 | INTRAMUSCULAR | Status: DC
Start: 2024-03-28 — End: 2024-03-29
  Administered 2024-03-28 – 2024-03-29 (×2): 1000 mg via INTRAVENOUS

## 2024-03-28 MED ORDER — MAGNESIUM SULFATE 2000 MG/50 ML IVPB PREMIX
2 | Freq: Once | INTRAVENOUS | Status: AC
Start: 2024-03-28 — End: 2024-03-28
  Administered 2024-03-28: 23:00:00 2000 mg via INTRAVENOUS

## 2024-03-28 MED ORDER — POTASSIUM PHOSPHATE 3 MMOL/ML IV SOLN (MIXTURES ONLY)
15 | Freq: Once | INTRAVENOUS | Status: AC
Start: 2024-03-28 — End: 2024-03-28
  Administered 2024-03-28: 03:00:00 15 mmol via INTRAVENOUS

## 2024-03-28 MED ORDER — IOPAMIDOL 76 % IV SOLN
76 | Freq: Once | INTRAVENOUS | Status: AC | PRN
Start: 2024-03-28 — End: 2024-03-27
  Administered 2024-03-28: 01:00:00 75 mL via INTRAVENOUS

## 2024-03-28 MED ORDER — LEVOTHYROXINE SODIUM 25 MCG PO TABS
25 | Freq: Every day | ORAL | Status: DC
Start: 2024-03-28 — End: 2024-04-07
  Administered 2024-03-29 – 2024-04-07 (×10): 25 ug via ORAL

## 2024-03-28 MED ORDER — LACTATED RINGERS IV SOLN
INTRAVENOUS | Status: DC
Start: 2024-03-28 — End: 2024-03-28

## 2024-03-28 MED ORDER — METOPROLOL TARTRATE 5 MG/5ML IV SOLN
5 | Freq: Once | INTRAVENOUS | Status: AC
Start: 2024-03-28 — End: 2024-03-28
  Administered 2024-03-28: 21:00:00 5 mg via INTRAVENOUS

## 2024-03-28 MED ORDER — AMOXICILLIN-POT CLAVULANATE 875-125 MG PO TABS
875-125 | Freq: Two times a day (BID) | ORAL | Status: DC
Start: 2024-03-28 — End: 2024-03-28

## 2024-03-28 MED FILL — POTASSIUM CHLORIDE 10 MEQ/100ML IV SOLN: 10 MEQ/0ML | INTRAVENOUS | Qty: 100

## 2024-03-28 MED FILL — POTASSIUM CHLORIDE 10 MEQ/100ML IV SOLN: 10 MEQ/0ML | INTRAVENOUS | Qty: 100 | Fill #0

## 2024-03-28 MED FILL — CEFTRIAXONE SODIUM 1 G IJ SOLR: 1 g | INTRAMUSCULAR | Qty: 1000

## 2024-03-28 MED FILL — HUMULIN R 100 UNIT/ML IJ SOLN: 100 [IU]/mL | INTRAMUSCULAR | Qty: 100 | Fill #0

## 2024-03-28 MED FILL — HUMULIN R 100 UNIT/ML IJ SOLN: 100 [IU]/mL | INTRAMUSCULAR | Qty: 100

## 2024-03-28 MED FILL — ACETAMINOPHEN 325 MG PO TABS: 325 mg | ORAL | Qty: 2

## 2024-03-28 MED FILL — MAGNESIUM SULFATE 2 GM/50ML IV SOLN: 2 GM/50ML | INTRAVENOUS | Qty: 50

## 2024-03-28 MED FILL — ACETAMINOPHEN 325 MG PO TABS: 325 mg | ORAL | Qty: 2 | Fill #0

## 2024-03-28 MED FILL — ENOXAPARIN SODIUM 40 MG/0.4ML IJ SOSY: 40 MG/0.4ML | INTRAMUSCULAR | Qty: 0.4

## 2024-03-28 MED FILL — PANTOPRAZOLE SODIUM 40 MG IV SOLR: 40 mg | INTRAVENOUS | Qty: 40

## 2024-03-28 MED FILL — POTASSIUM PHOSPHATES(66 MEQ K) 45 MMOLE/15ML IV SOLN: 45 MMOLE/15ML | INTRAVENOUS | Qty: 5 | Fill #0

## 2024-03-28 MED FILL — SODIUM PHOSPHATES 15 MMOLE/5ML IV SOLN: 15 MMOLE/5ML | INTRAVENOUS | Qty: 5

## 2024-03-28 MED FILL — METOPROLOL TARTRATE 5 MG/5ML IV SOLN: 5 mg/mL | INTRAVENOUS | Qty: 5

## 2024-03-28 MED FILL — LEVOTHYROXINE SODIUM 75 MCG PO TABS: 75 ug | ORAL | Qty: 1

## 2024-03-28 NOTE — Progress Notes (Signed)
"  1545: Patient HR tachy in 160s, enter patients room and patient actively trying to get out of bed stating I have to pee. HR sustaining in 180s at this time. Residents at bedside at this time. Patient asking to get up to the bathroom, educated on reason why she cannot get out of bed right now with HR that high. Purewick placed at this time. STAT EKG ordered    1602: 5mg  IV metoprolol  administered, HR down to 120s after given. Residents updated.     "

## 2024-03-28 NOTE — Progress Notes (Addendum)
"  2300: Patient consumed 120 mL apple juice and 100% apple sauce     2308: insult and D5 stopped at this time  "

## 2024-03-28 NOTE — Progress Notes (Addendum)
 "    V2.0    [facility] Progress Note      Name:  Shelly Richard DOB/Age/Sex: September 18, 1985  (38 y.o. female)   MRN & CSN:  [id number] & [id number] Encounter Date/Time: 03/28/2024 7:30 PM EST   Location:  4516/4516-01 PCP: Alexandro Bernice FORBES, [name]     Attending:Rodriguez Marylyn FordAlomere Health Day: 3    Assessment and Recommendations     38yo female with Hx T2DM, HLD, HTN, presenting with HA, SOB, and admitted with DKA after not being able to take meds 2/2 medical insurance. Has been on ICU on insulin  drip since admission. Suspected UTI and persistent sinus tachycardia for which she has been started on Rocephin . Also with Decreased TSH with adjustments in Synthroid  levels.     DKA  Hx of DM  -Continue ICU level of care as per ICU team. Currently on insulin  infusion. Will follow up in am.   -Social work consult.     Persistent sinus tachycardia  Possible UTI  Possible sepsis  -Continue Rocephin . Will follow up.  -S/P IV fluids.     HLD    HTN    Hypothyroidism  -Continue Synthroid  at adjusted dose.     Diet ADULT DIET; Regular; 3 carb choices (45 gm/meal)   DVT Prophylaxis []  Lovenox , []   Heparin, []  SCDs, []  Ambulation,  []  Eliquis, []  Xarelto  []  Coumadin   Code Status Full Code   Disposition From: Home  Expected Disposition: Home  Estimated Date of Discharge: TBD  Patient requires continued admission due to DKA   Surrogate Decision Maker/ POA  [facility] listed as primary contact.      Personally reviewed Lab Studies and Imaging     Discussed management of the case with case management. I reviewed HnP        Subjective:     Patient seen and evaluated at bedside. No acute complaints.       Review of Systems:      Pertinent positives and negatives discussed in HPI    Objective:     Intake/Output Summary (Last 24 hours) at 03/28/2024 1930  Last data filed at 03/28/2024 1800  Gross per 24 hour   Intake 89913 ml   Output 1950 ml   Net 8136 ml      Vitals:   Vitals:    03/28/24 1730 03/28/24 1745 03/28/24 1800  03/28/24 1857   BP: 127/63 135/69 135/67    Pulse: (!) 138 (!) 138 (!) 139    Resp: 18 24 20     Temp:    99.6 F (37.6 C)   TempSrc:       SpO2:  100% 100%    Weight:       Height:             Physical Exam:      General: NAD  Eyes: EOMI  ENT: neck supple  Cardiovascular: Tachycardic  Respiratory: Clear to auscultation  Gastrointestinal: Soft, non tender  Genitourinary: no suprapubic tenderness  Musculoskeletal: No edema  Skin: warm, dry  Neuro: Alert.  Psych: Mood appropriate.         Medications:   Medications:    cefTRIAXone  (ROCEPHIN ) IV  1,000 mg IntraVENous Q24H    [START ON 03/29/2024] levothyroxine   25 mcg Oral Daily    magnesium  sulfate  2,000 mg IntraVENous Once    sodium chloride  flush  5-40 mL IntraVENous 2 times per day  enoxaparin   40 mg SubCUTAneous Daily    pantoprazole  (PROTONIX ) 40 mg in sodium chloride  (PF) 0.9 % 10 mL injection  40 mg IntraVENous Daily      Infusions:    sodium chloride       dextrose  5 % and 0.45 % NaCl 150 mL/hr at 03/28/24 1425    insulin  9.4 Units/hr (03/28/24 1857)    dextrose       sodium chloride        PRN Meds: dextrose  bolus, 125 mL, PRN   Or  dextrose  bolus, 250 mL, PRN  potassium chloride , 10 mEq, PRN  magnesium  sulfate, 2,000 mg, PRN  sodium phosphate  15 mmol in sodium chloride  0.9 % 250 mL IVPB, 15 mmol, PRN  sodium chloride  flush, 5-40 mL, PRN  sodium chloride , , PRN  potassium chloride , 40 mEq, PRN   Or  potassium alternative oral replacement, 40 mEq, PRN   Or  potassium chloride , 10 mEq, PRN  magnesium  sulfate, 2,000 mg, PRN  ondansetron , 4 mg, Q8H PRN   Or  ondansetron , 4 mg, Q6H PRN  polyethylene glycol, 17 g, Daily PRN  acetaminophen , 650 mg, Q6H PRN   Or  acetaminophen , 650 mg, Q6H PRN  dextrose  5 % and 0.45 % NaCl, , Continuous PRN  glucose, 4 tablet, PRN  glucagon , 1 mg, PRN  dextrose , , Continuous PRN  albuterol , 2.5 mg, Q6H PRN  HYDROmorphone , 0.25 mg, Q3H PRN   Or  HYDROmorphone , 0.5 mg, Q3H PRN        Labs and Imaging   CT SOFT TISSUE NECK WO  CONTRAST  Result Date: 03/28/2024  DATE: March 28, 2024 EXAM: CT SOFT TISSUE NECK WO CONTRAST INDICATION: neck pain, fever; COMPARISON: None. TECHNIQUE: Axial CT imaging obtained from the skull base through the lung apices. Axial images and multiplanar reformatted images reviewed.   Up-to-date CT equipment and radiation dose reduction techniques were employed. IV Contrast: None FINDINGS: SCOUT: Nondiagnostic scout image(s) obtained for localization purposes, not for diagnostic use DEVICES/HARDWARE: None . ORBITS, PARANASAL SINUSES, SKULL BASE: Limited images of the base of the brain demonstrate no mass effect. Orbits are normal. Imaged sinuses are clear. CERVICAL SPINE/BONES: No destructive process. NASAL CAVITY/NASOPHARYNX: No mass. ORAL CAVITY/OROPHARYNX: No evidence of mass. HYPOPHARYNX AND LARYNX: Normal. DEEP NECK SPACES: The parapharyngeal, retropharyngeal and masticator spaces are normal. SALIVARY GLANDS: Submandibular glands, sublingual glands and parotid glands are normal. THYROID GLAND: Normal. LYMPH NODES: Nonenlarged cervical lymph nodes bilaterally. THORACIC INLET: No mass or acute abnormality involving the visualized mediastinal structures, trachea or lung apices. OTHER: No significant abnormality.     1. Negative. This dictation was created with voice recognition software.  While attempts have been made to review the dictation as it was transcribed, occasionally the spoken word can be misinterpreted  by the technology, leading to omissions or inappropriate words or phrases.  If questions or concerns arise, please contact the Radiology Department. Electronically signed by Elouise Hugh, MD    CT ABDOMEN PELVIS WO CONTRAST Additional Contrast? None  Result Date: 03/28/2024  DATE: 03/28/2024 EXAM: CT ABDOMEN PELVIS WO CONTRAST INDICATION: hip pain Pain COMPARISON: 2015 TECHNIQUE: Axial CT imaging obtained from lung bases through pelvis. Axial images and multiplanar reformatted images are provided for  review.  Up-to-date CT equipment and radiation dose reduction techniques were employed. IV Contrast: None Oral Contrast: No. FINDINGS: SCOUT: Nondiagnostic scout image(s) obtained for localization purposes, not for diagnostic use DEVICES/HARDWARE: None . LUNG BASES: Clear. LIVER: There is decreased in attenuation. Liver is up to  25 cm in craniocaudal dimension. GALLBLADDER AND BILIARY TREE: No calcified gallstones. No gallbladder distention.  No intra- or extrahepatic biliary dilatation. PANCREAS: Normal. SPLEEN: Spleen is up to 14 cm in craniocaudal dimension. ADRENAL GLANDS: Normal. KIDNEYS AND URETERS: No hydronephrosis. Focal areas of striated enhancement of the kidneys bilaterally. Mild perinephric haziness bilaterally without fluid collection. BOWEL: Normal diameter, nonobstructed.  Appendix is not dilated or thickened. Distal colonic diverticulosis. URINARY BLADDER: Normal. REPRODUCTIVE ORGANS: No associated masses. LYMPH NODES: No abnormally enlarged nodes. PERITONEUM/RETROPERITONEUM: No ascites or free air. VESSELS: Aorta and IVC without significant abnormality.  ABDOMINAL WALL: Mild periarticular edema or swelling of the left hip. BONES: No significant abnormality. OTHER FINDINGS: None.     1. Diverticulosis of the colon without evidence of diverticulitis. 2. Focal areas of striated enhancement of the kidneys, previous CT contrast administration. Findings could indicate renal dysfunction or infection. 3. Mild periarticular edema or swelling of the left hip suggesting muscular injury/strain. 4. Hepatosplenomegaly. This dictation was created with voice recognition software.  While attempts have been made to review the dictation as it was transcribed, occasionally the spoken word can be misinterpreted  by the technology, leading to omissions or inappropriate words or phrases.  If questions or concerns arise, please contact the Radiology Department. Electronically signed by Elouise Hugh, MD    CT HEAD WO  CONTRAST  Result Date: 03/28/2024  EXAM: CT HEAD WO CONTRAST STUDY DATE: 03/28/2024 11:15 EST INDICATION: neck pain COMPARISON: None TECHNIQUE: Standard per department protocol. Up-to-date CT equipment and radiation dose reduction techniques were employed. CONTRAST: No intravenous FINDINGS: Brain Parenchyma: No midline shift, mass effect, parenchymal hemorrhage, or evidence of acute territorial infarct. Ventricular System and Extra-Axial Spaces: No extra-axial fluid collections. Basilar cisterns are patent. No hydrocephalus. Extracranial Structures: No significant paranasal sinus disease. No calvarial abnormality. No orbit abnormality.     *  No acute intracranial findings. Electronically signed by Isom Repress    CT CHEST PULMONARY EMBOLISM W CONTRAST  Result Date: 03/27/2024  DATE: 03/27/2024 EXAM: CT CHEST PULMONARY EMBOLISM W CONTRAST INDICATION: c/f PE, Chest pain COMPARISON: None. TECHNIQUE: Axial CT imaging obtained through the chest using CT pulmonary angiogram protocol. Axial images, multiplanar reformatted images and maximum intensity projection images were reviewed.  Up-to-date CT equipment and radiation dose reduction techniques were employed.  IV Contrast Media and volume: 80 cc iodinated contrast FINDINGS: SCOUT: Nondiagnostic scout image(s) obtained for localization purposes, not for diagnostic use DEVICES/HARDWARE: None . DIAGNOSTIC QUALITY: Suboptimal. PULMONARY EMBOLI: None. RIGHT HEART STRAIN: None. LUNGS AND AIRWAYS: Airways are patent.  Lungs are clear. PLEURA: No pleural effusions or significant pleural thickening. HEART / GREAT VESSELS: Heart is not enlarged. ADENOPATHY: None. CHEST WALL / LOWER NECK: No significant abnormality. UPPER ABDOMEN: Normal. BONES: No significant abnormality. OTHER FINDINGS: None.     1. No evidence of pulmonary embolus. This dictation was created with voice recognition software.  While attempts have been made to review the dictation as it was transcribed,  occasionally the spoken word can be misinterpreted  by the technology, leading to omissions or inappropriate words or phrases.  If questions or concerns arise, please contact the Radiology Department. Electronically signed by Elouise Hugh, MD    XR CHEST PORTABLE  Result Date: 03/27/2024  Chest HISTORY : Dyspnea VIEWS : 1     Clear lungs. Normal cardiac mediastinal silhouette. Electronically signed by Lynwood Schwalbe, MD      CBC:   Recent Labs     03/26/24  2331 03/27/24  2341  03/28/24  0414   WBC 22.7* 16.1* 16.4*   HGB 10.9* 8.7* 8.7*   PLT 503* 491* 455*     BMP:    Recent Labs     03/27/24  2341 03/28/24  0413 03/28/24  1059 03/28/24  1520   NA 131* 133*  133*  --  132*   K 3.9 3.7  4.0 3.7 3.6   CL 100 101  101  --  100   CO2 18* 20*  19*  --  21   BUN <2* 3*  3*  --  4*   CREATININE 0.6 0.6  0.6  --  0.6   GLUCOSE 190* 154*  151*  --  157*     Hepatic: No results for input(s): AST, ALT, BILITOT, ALKPHOS in the last 72 hours.    Invalid input(s): ALB  Lipids:   Lab Results   Component Value Date/Time    CHOL 154 11/25/2023 02:44 PM    HDL 22 11/25/2023 02:44 PM    TRIG 170 11/25/2023 02:44 PM     Hemoglobin A1C:   Lab Results   Component Value Date/Time    LABA1C 13.9 03/27/2024 03:47 AM     TSH:   Lab Results   Component Value Date/Time    TSH 1.92 08/27/2022 04:35 PM     Troponin: No results found for: TROPONINT  Lactic Acid:   Recent Labs     03/26/24  2331 03/28/24  0821 03/28/24  1520   LACTA 1.6 2.4* 2.1*     BNP: No results for input(s): PROBNP in the last 72 hours.  UA:  Lab Results   Component Value Date/Time    NITRU Negative 03/28/2024 02:03 AM    COLORU Yellow 03/28/2024 02:03 AM    PHUR 6.0 03/28/2024 02:03 AM    PHUR 5.0 03/28/2024 02:03 AM    PHUR 5.5 02/25/2017 12:22 PM    WBCUA 3-5 03/28/2024 02:03 AM    RBCUA 5-10 03/28/2024 02:03 AM    BACTERIA 3+ 03/28/2024 02:03 AM    CLARITYU Clear 03/28/2024 02:03 AM    LEUKOCYTESUR TRACE 03/28/2024 02:03 AM    UROBILINOGEN 0.2  03/28/2024 02:03 AM    BILIRUBINUR Negative 03/28/2024 02:03 AM    BLOODU SMALL 03/28/2024 02:03 AM    GLUCOSEU Negative 03/28/2024 02:03 AM    KETUA Negative 03/28/2024 02:03 AM     Urine Cultures:   Lab Results   Component Value Date/Time    LABURIN  11/25/2023 02:44 PM     <10,000 CFU/ml mixed skin/urogenital flora. No further workup    LABURIN  11/25/2023 02:44 PM     <10,000 CFU/ml  Susceptibility testing of penicillin and other beta lactams is  not necessary for beta hemolytic Streptococci since resistant  strains have not been identified. (CLSI M100)  No further workup       Blood Cultures: No results found for: BC  No results found for: BLOODCULT2  Organism:   Lab Results   Component Value Date/Time    ORG Strep agalactiae (Beta Strep Group B) 11/25/2023 02:44 PM         Electronically signed by Sheree CHRISTELLA Evern Marylyn, MD on 03/28/2024 at 7:30 PM    "

## 2024-03-28 NOTE — Progress Notes (Signed)
"  STAT EKLG obtained, place in pt chart.  "

## 2024-03-28 NOTE — Plan of Care (Signed)
"    Problem: Chronic Conditions and Co-morbidities  Goal: Patient's chronic conditions and co-morbidity symptoms are monitored and maintained or improved  03/28/2024 0928 by Donah Sayres, RN  Outcome: Progressing  03/27/2024 2304 by Arzella Arnette [place], RN  Outcome: Progressing  Flowsheets (Taken 03/27/2024 2000)  Care Plan - Patient's Chronic Conditions and Co-Morbidity Symptoms are Monitored and Maintained or Improved:   Monitor and assess patient's chronic conditions and comorbid symptoms for stability, deterioration, or improvement   Collaborate with multidisciplinary team to address chronic and comorbid conditions and prevent exacerbation or deterioration   Update acute care plan with appropriate goals if chronic or comorbid symptoms are exacerbated and prevent overall improvement and discharge     Problem: Discharge Planning  Goal: Discharge to home or other facility with appropriate resources  03/28/2024 0928 by Donah Sayres, RN  Outcome: Progressing  03/27/2024 2304 by Arzella Arnette [place], RN  Outcome: Progressing  Flowsheets (Taken 03/27/2024 2000)  Discharge to home or other facility with appropriate resources:   Identify barriers to discharge with patient and caregiver   Arrange for needed discharge resources and transportation as appropriate   Identify discharge learning needs (meds, wound care, etc)   Refer to discharge planning if patient needs post-hospital services based on physician order or complex needs related to functional status, cognitive ability or social support system     Problem: Safety - Adult  Goal: Free from fall injury  03/28/2024 0928 by Donah Sayres, RN  Outcome: Progressing  03/27/2024 2304 by Arzella Arnette [place], RN  Outcome: Progressing  Flowsheets (Taken 03/27/2024 2000)  Free From Fall Injury:   Instruct family/caregiver on patient safety   Based on caregiver fall risk screen, instruct family/caregiver to ask for assistance with transferring infant if  caregiver noted to have fall risk factors     Problem: Pain  Goal: Verbalizes/displays adequate comfort level or baseline comfort level  03/28/2024 0928 by Donah Sayres, RN  Outcome: Progressing  03/27/2024 2304 by Arzella Arnette [place], RN  Outcome: Progressing     Problem: Nutrition Deficit:  Goal: Optimize nutritional status  03/28/2024 0928 by Donah Sayres, RN  Outcome: Progressing  03/27/2024 2304 by Arzella Arnette [place], RN  Outcome: Progressing     Problem: ABCDS Injury Assessment  Goal: Absence of physical injury  Outcome: Progressing     "

## 2024-03-28 NOTE — Plan of Care (Signed)
"    Problem: Chronic Conditions and Co-morbidities  Goal: Patient's chronic conditions and co-morbidity symptoms are monitored and maintained or improved  Outcome: Progressing  Flowsheets (Taken 03/28/2024 2000)  Care Plan - Patient's Chronic Conditions and Co-Morbidity Symptoms are Monitored and Maintained or Improved:   Monitor and assess patient's chronic conditions and comorbid symptoms for stability, deterioration, or improvement   Collaborate with multidisciplinary team to address chronic and comorbid conditions and prevent exacerbation or deterioration   Update acute care plan with appropriate goals if chronic or comorbid symptoms are exacerbated and prevent overall improvement and discharge     Problem: Discharge Planning  Goal: Discharge to home or other facility with appropriate resources  Outcome: Progressing  Flowsheets (Taken 03/28/2024 2000)  Discharge to home or other facility with appropriate resources:   Identify barriers to discharge with patient and caregiver   Arrange for needed discharge resources and transportation as appropriate   Identify discharge learning needs (meds, wound care, etc)   Refer to discharge planning if patient needs post-hospital services based on physician order or complex needs related to functional status, cognitive ability or social support system     Problem: Safety - Adult  Goal: Free from fall injury  Outcome: Progressing     Problem: Pain  Goal: Verbalizes/displays adequate comfort level or baseline comfort level  Outcome: Progressing     Problem: Nutrition Deficit:  Goal: Optimize nutritional status  Outcome: Progressing     Problem: ABCDS Injury Assessment  Goal: Absence of physical injury  Outcome: Progressing     "

## 2024-03-28 NOTE — Progress Notes (Addendum)
 "     ICU Progress Note    Admit Date: 03/26/2024  Hospital Day:  Hospital Day: 3  ICU Day:      Interval History     Anion gap closed for 24 hours  Bicarbonate still low at 20 this a.m.  Remains on insulin  gtt      Tachycardic, temp to 100.4 yesterday afternoon  Flu/covid negative  CT PE negative    Still tachycardic   U/a some bacteria    Ceftriaxone    Uds negative    Lactate elevated 2.4 this morning  Remains on IV fluids  Recheck in 4 hours      HPI (from H&P):  [name] is a 38 y.o. female with a medical hx significant for type 2 diabetes, mixed hyperlipidemia, and mild hypertension, otherwise as listed in the MHx table below, who presented from home to the ED on 03/27/2024, here with headache and shortness of breath. Patient reportedly has not had health insurance coverage since September, and as a result has not been taking any of her medications.  She has been having 1 week history of difficulty breathing. She also has a 4-month history of headache that begins at the base of her neck and radiates to bilateral temples, left more than right. She has had previous hospitalizations for her headache and she was treated with pain medications and robaxin . She notes that this did not help very much. Patient also notes to have had increased polydipsia, and urinary frequency. Patient also had not checked her blood sugars recently, last time she checked was around August when sugars were ranging in 150s to high 250s. Patient notes she did not have any issues with medications when she was taking them. Denies any changes in vision, photophobia, nausea, vomiting, motor weakness, sensory loss, chest pain, abdominal pain, hematuria or blood in stool.      On presentarion patient was found to be tachycardic and hypertensive, with a glucose of 418, anion gap metabolic acidosis. Elevated white count. Urine ketoacidosis. CXR was unremarkable.      Objective     MEDICATIONS:  Scheduled:     cefTRIAXone  (ROCEPHIN ) IV   1,000 mg IntraVENous Q24H    sodium chloride  flush  5-40 mL IntraVENous 2 times per day    enoxaparin   40 mg SubCUTAneous Daily    levothyroxine   75 mcg Oral Daily    pantoprazole  (PROTONIX ) 40 mg in sodium chloride  (PF) 0.9 % 10 mL injection  40 mg IntraVENous Daily     Continuous Infusions:     sodium chloride       dextrose  5 % and 0.45 % NaCl 150 mL/hr at 03/28/24 0045    insulin  12.8 Units/hr (03/28/24 0648)    dextrose       sodium chloride        PRN:  dextrose  bolus **OR** dextrose  bolus, potassium chloride , magnesium  sulfate, sodium phosphate  15 mmol in sodium chloride  0.9 % 250 mL IVPB, sodium chloride  flush, sodium chloride , potassium chloride  **OR** potassium alternative oral replacement **OR** potassium chloride , magnesium  sulfate, ondansetron  **OR** ondansetron , polyethylene glycol, acetaminophen  **OR** acetaminophen , dextrose  5 % and 0.45 % NaCl, glucose, glucagon , dextrose , albuterol , HYDROmorphone  **OR** HYDROmorphone     VITALS:   Patient Vitals for the past 5 hrs:   BP Temp Temp src Pulse Resp SpO2 Weight   03/28/24 0600 (!) 148/71 (!) 100.8 F (38.2 C) Temporal (!) 146 25 98 % 78.2 kg (172 lb 6.4 oz)   03/28/24 0500 (!) 146/75 -- -- MAGNUS  143 22 96 % --   03/28/24 0400 (!) 155/73 100.3 F (37.9 C) Temporal (!) 142 21 98 % --   03/28/24 0300 137/74 -- -- (!) 135 21 98 % --         Intake/Output Summary (Last 24 hours) at 03/28/2024 0716  Last data filed at 03/28/2024 0700  Gross per 24 hour   Intake 7494.64 ml   Output 2350 ml   Net 5144.64 ml         PHYSICAL EXAM:   Physical Exam  Vitals and nursing note reviewed.   Constitutional:       General: She is not in acute distress.  Cardiovascular:      Rate and Rhythm: Regular rhythm. Tachycardia present.      Heart sounds: Normal heart sounds.   Pulmonary:      Breath sounds: Normal breath sounds.   Abdominal:      Tenderness: There is no abdominal tenderness.   Musculoskeletal:      Right lower leg: No edema.      Left lower leg: No edema.    Neurological:      General: No focal deficit present.      Mental Status: She is alert and oriented to person, place, and time. Mental status is at baseline.           LABS:  ABGs:  No results for input(s): PHART, PCO2ART, PO2ART, HCO3ART, BEART, THGBART, O2SATART, TCO2ART in the last 72 hours.     VBGs:  Recent Labs     03/26/24  2331 03/27/24  0625   PHVEN 7.196* 7.313*   PCO2VEN 28.5* 33.2*   PO2VEN <30.0 24   HCO3VEN 11.0* 16.8*   BEVEN -15.7* -9*   O2SATVEN 45 38       CBC:   Recent Labs     03/26/24  2331 03/27/24  2341 03/28/24  0414   WBC 22.7* 16.1* 16.4*   HGB 10.9* 8.7* 8.7*   HCT 34.8* 27.3* 26.9*   PLT 503* 491* 455*   MCV 80.0 78.2* 78.2*     Renal:    Recent Labs     03/27/24  1659 03/27/24  2119 03/27/24  2341 03/28/24  0413   NA 127* 131* 131* 133*  133*   K 3.5 3.8 3.9 3.7  4.0   CL 98* 101 100 101  101   CO2 17* 19* 18* 20*  19*   BUN <2* <2* <2* 3*  3*   CREATININE 0.5* 0.6 0.6 0.6  0.6   GLUCOSE 178* 167* 190* 154*  151*   CALCIUM  8.5 8.9 8.9 8.5  8.2*   MG 2.05 1.93 1.90  --    PHOS 2.0* 2.2* 2.0*  --    ANIONGAP 12 11 13 12  13      Hepatic: No results for input(s): AST, ALT, BILITOT, BILIDIR, LABALBU, ALKPHOS in the last 72 hours.    Invalid input(s): PROT  Troponin:   Recent Labs     03/28/24  0413   TROPHS <6       NT-proBNP:  No results for input(s): PROBNP in the last 72 hours.  BNP: No results for input(s): BNP in the last 72 hours.  Lipids: No results for input(s): CHOL, HDL in the last 72 hours.    Invalid input(s): LDLCALCU, TRIGLYCERIDE  INR: No results for input(s): INR in the last 72 hours.  Lactate:   Recent Labs     03/27/24  [phone]  LACTATE 0.75     Lactic acid:  Invalid input(s): LACTICACID  ESR: No results for input(s): SEDRATE in the last 72 hours.  CRP:  No results for input(s): CRP in the last 72 hours.      MICROBIOLOGY:  Previous Cultures:       Results       Procedure Component Value Units Date/Time    Culture,  Urine [[id number]]     Order Status: Sent Specimen: Urine, Clean Catch     Culture, Blood 1 [[id number]]     Order Status: Sent Specimen: Blood     Culture, Blood 2 [[id number]]     Order Status: Sent Specimen: Blood     COVID-19 & Influenza Combo [[id number]] Collected: 03/27/24 1607    Order Status: Completed Specimen: Nasopharyngeal Swab Updated: 03/27/24 1741     SARS-CoV-2 RNA, RT PCR NOT DETECTED     Comment: Not Detected results do not preclude SARS-CoV-2 infection and  should not be used as the sole basis for patient management  decisions.  Results must be combined with clinical observations,  patient history, and epidemiological information.    Testing was performed using [employer] SARS-CoV-2 and Influenza A/B  nucleic acid assay. This test is a multiplex Real-Time Reverse  Transcriptase Polymerase Chain Reaction (RT-PCR)-based in vitro  diagnostic test intended for the qualitative detection of nucleic  acids from SARS-CoV-2, influenza A, and influenza B in nasopharyngeal  and nasal swab specimens.    Patient Fact Sheet:  salonclasses.at  Provider Fact Sheet: coursepreviews.dk  EUA: forexcoupons.hu  IFU: webcrashers.at    Methodology:  RT-PCR          Influenza A NOT DETECTED     Influenza B NOT DETECTED    Urine Culture [[id number]] Collected: 03/26/24 2343    Order Status: Sent Specimen: Urine voided                RADIOLOGY:   CT CHEST PULMONARY EMBOLISM W CONTRAST   Final Result      1. No evidence of pulmonary embolus.            This dictation was created with voice recognition software.  While attempts have been made to review the dictation as it was transcribed, occasionally the spoken word can be misinterpreted  by the technology, leading to omissions or inappropriate words    or phrases.  If questions or concerns arise, please contact the Radiology Department.      Electronically signed by Elouise Hugh, MD      XR CHEST PORTABLE   Final Result      Clear lungs.      Normal cardiac mediastinal silhouette.      Electronically signed by Lynwood Schwalbe, MD            ASSESSMENT & PLAN     Ms. ANTHEA UDOVICH is a 38 y.o. female with a medical hx significant for type 2 diabetes, mixed hyperlipidemia, and mild hypertension, otherwise as listed in the MHx table below, who presented from home to the ED on 03/27/2024, here with headache and shortness of breath.      #Diabetic Ketoacidosis Type 2   #High Anion Gap Metabolic Acidosis   #Headache   #Elevated White Count   Patient with poorly controlled type 2 diabetes mellitus presenting with diabetic ketoacidosis (DKA), evidenced by hyperglycemia, elevated anion gap metabolic acidosis, ketonuria, and low bicarbonate. Delta gap is between 1-2, meaning pure, uncomplicated HAGMA. DKA is most likely secondary  to medication nonadherence due to lack of insurance coverage. Patient also reports headache, likely related to metabolic derangements from DKA and generalized medication noncompliance, including antihypertensive therapy. Leukocytosis is present and is suspected to be reactive in the setting of DKA, with no clear infectious source identified at this time. UA looks unremarkable for infective and CXR similarly is unremarkable.   - Anion gap closed 24 hours  - Bicarb remains elevated at 20  - Still on insulin  gtt. with very high requirement.   On Lantus  40, lispro 10 3 times daily with meals at home    #Tachycardia  #Fever  Tachy to 140-150, tachypneic to mid 20s, febrile to tMax 102.9. Hypertensive to systolics 170s. Initial infectious workup was negative.   -CT PE   Negative  -flu/covid negative  -follow-up u/a   3+ bacteria   +LE   Started on ceftriaxone     #Neck pain  #Headache  - CT head negative       Chronic Medical Conditions:  #Hypothyroidism   -Continue Levothyroxine       #Hypertension   -Will hold Losartan  in the setting of slight inc in cr from baseline  on presentation. Will treat DKA first      #Hyperlipidemia   -Simvastatin  not given tonight as patient Npo        Glycemic goal:  140-180 mg/dL  LDAs:    Infusions:  insulin , IVF  Abx: rocephin    Diet:  ADULT DIET; Regular; 3 carb choices (45 gm/meal)  GI PPx:    Protonix  40 mg IV qd  Bowel Regimen: N/A  DVT PPx: LMWH    Code Status: Full Code     Disposition:   Prior to admission:  From home  Current:  ICU  Upon discharge:  TBD      Saahil Kotian, MD, PGY-1  Internal Medicine Resident  Contact via [facility]    PULMONARY AND CRITICAL CARE ATTENDING:  Patient was seen, examined and discussed with Dr. Kotian PGY-1. I agree with the history of present illness, past medical/surgical histories, family history, social history, medication list and allergies as listed. The review of systems is as noted above. My physical exam confirms the findings listed above.   Chart was reviewed including Labs, CXR, CT scan, EKG, and Medical records confirm the findings noted above.   I edited the note where appropriate.     Briefly, this is a 38 y.o. female admitted to ICU with DKA  Reported recently became uninsured and this resulted in inability to procure medications leading to DKA.      INTERVAL HISTORY:  Fever yesterday, still tachycardic, no PE on imaging. No meningeal signs.      BP 114/65   Pulse (!) 133   Temp 97.5 F (36.4 C) (Temporal)   Resp 19   Ht 1.626 m (5' 4)   Wt 78.2 kg (172 lb 6.4 oz)   LMP 03/23/2024   SpO2 99%   BMI 29.59 kg/m      Intake/Output Summary (Last 24 hours) at 03/28/2024 1048  Last data filed at 03/28/2024 0907      Gross per 24 hour   Intake 7494.64 ml   Output 2550 ml   Net 4944.64 ml    SpO2: 99 %  -        Peripheral IV 03/26/24 Left;Proximal Forearm (Active)   Number of days: 0       Peripheral IV 03/27/24 Right Antecubital (Active)   Number of days: 0  ASSESSMENT:  Sepsis - suspected 2/2 UTI  DKA  Hypothyroidism  Social health deterrants - uninsured     PLAN:  Provide O2  supplementation to keep SpO2 between 88-92% if needed.    DKA protocol  Advance diet as tolerated  CT H/N/A/P  ICU Glycemic goal 140-180 mg/dL  Diet: carb controlled  IVF: 1/2NS  GI prophylaxis: elevate HOB 30 degrees, PPI  DVT prophylaxis: LMWH  Bowel regimen: N/A  Abx: N/A     UPDATE:  Scans are negative for collections/abscess, working under the assumption that we are dealing with a UTI at the moment.  Her heart rate remains quite elevated, last office visit was in August and her heart rate was already 100, she might have chronic sinus tachycardia, her TSH was 0.25, may need to decrease her levothyroxine  dose.  -Decreased Levothyroxine  to 25 mcg/d.    Pt has a high probability of imminent or life-threatening deterioration requiring close monitoring, and highly complex decision-making and/or interventions of high intensity to assess, manipulate, and support his critical organ systems to prevent a likely inevitable decline which could occur if left untreated.      A total critical care time 31 minutes was used. This includes but not limited to examining patient, collaborating with other physicians, monitoring vital signs, telemetry, continuous pulse oximetry, and clinical response to IV medications, documentation time, review and interpretation of laboratory and radiological data, review of nursing notes and old record review. This time excludes any time that may have been spent performing procedures for life threatening organ failure.     The note was completed using EMR and Dragon dictation system. Every effort was made to ensure accuracy; however, inadvertent computerized transcription errors may be present.     [facility] [place] Rosendo Hilts MD  Pulmonary and Critical Care Medicine    "

## 2024-03-28 NOTE — Progress Notes (Signed)
"  Residents notified of 2.4 lactic, orders received to repeat in 4 hours. Plan of care continues.  "

## 2024-03-28 NOTE — Progress Notes (Addendum)
"  Patient remains on insulin  gtt despite anion gap closure and euglycemia. She expresses her discomfort with frequent glucose checks and is declining further peripheral finger sticks for glucose monitoring at this time, stating that she would like to discontinue this therapy. RN reached out to MD to discuss treatment options.  "

## 2024-03-29 LAB — RENAL FUNCTION PANEL
Albumin: 2.8 g/dL — ABNORMAL LOW (ref 3.4–5.0)
Albumin: 2.6 g/dL — ABNORMAL LOW (ref 3.4–5.0)
Albumin: 2.4 g/dL — ABNORMAL LOW (ref 3.4–5.0)
Albumin: 2.4 g/dL — ABNORMAL LOW (ref 3.4–5.0)
Anion Gap: 11 (ref 3–16)
Anion Gap: 10 (ref 3–16)
Anion Gap: 12 (ref 3–16)
Anion Gap: 15 (ref 3–16)
BUN: 4 mg/dL — ABNORMAL LOW (ref 7–20)
BUN: 4 mg/dL — ABNORMAL LOW (ref 7–20)
BUN: 5 mg/dL — ABNORMAL LOW (ref 7–20)
BUN: 5 mg/dL — ABNORMAL LOW (ref 7–20)
CO2: 23 mmol/L (ref 21–32)
CO2: 21 mmol/L (ref 21–32)
CO2: 18 mmol/L — ABNORMAL LOW (ref 21–32)
CO2: 20 mmol/L — ABNORMAL LOW (ref 21–32)
Calcium: 8.7 mg/dL (ref 8.3–10.6)
Calcium: 8.2 mg/dL — ABNORMAL LOW (ref 8.3–10.6)
Calcium: 8.5 mg/dL (ref 8.3–10.6)
Calcium: 8.5 mg/dL (ref 8.3–10.6)
Chloride: 102 mmol/L (ref 99–110)
Chloride: 101 mmol/L (ref 99–110)
Chloride: 100 mmol/L (ref 99–110)
Chloride: 100 mmol/L (ref 99–110)
Creatinine: 0.6 mg/dL (ref 0.6–1.1)
Creatinine: 0.6 mg/dL (ref 0.6–1.1)
Creatinine: 0.6 mg/dL (ref 0.6–1.1)
Creatinine: 0.6 mg/dL (ref 0.6–1.1)
Est, Glom Filt Rate: >90
Est, Glom Filt Rate: >90
Est, Glom Filt Rate: >90
Est, Glom Filt Rate: >90
Glucose: 144 mg/dL — ABNORMAL HIGH (ref 70–99)
Glucose: 295 mg/dL — ABNORMAL HIGH (ref 70–99)
Glucose: 255 mg/dL — ABNORMAL HIGH (ref 70–99)
Glucose: 257 mg/dL — ABNORMAL HIGH (ref 70–99)
Phosphorus: 2 mg/dL — ABNORMAL LOW (ref 2.5–4.9)
Phosphorus: 2.7 mg/dL (ref 2.5–4.9)
Phosphorus: 2 mg/dL — ABNORMAL LOW (ref 2.5–4.9)
Phosphorus: 2.1 mg/dL — ABNORMAL LOW (ref 2.5–4.9)
Potassium: 3.5 mmol/L (ref 3.5–5.1)
Potassium: 3.6 mmol/L (ref 3.5–5.1)
Potassium: 4 mmol/L (ref 3.5–5.1)
Potassium: 4.2 mmol/L (ref 3.5–5.1)
Sodium: 136 mmol/L (ref 136–145)
Sodium: 132 mmol/L — ABNORMAL LOW (ref 136–145)
Sodium: 132 mmol/L — ABNORMAL LOW (ref 136–145)
Sodium: 133 mmol/L — ABNORMAL LOW (ref 136–145)

## 2024-03-29 LAB — CBC WITH AUTO DIFFERENTIAL
Basophils %: 0.5 %
Basophils %: 0.4 %
Basophils Absolute: 0.1 K/uL (ref 0.0–0.2)
Basophils Absolute: 0.1 10*3/uL (ref 0.0–0.2)
Eosinophils %: 0.2 %
Eosinophils %: 0.3 %
Eosinophils Absolute: 0 K/uL (ref 0.0–0.6)
Eosinophils Absolute: 0 10*3/uL (ref 0.0–0.6)
Hematocrit: 20.2 % — CL (ref 36.0–48.0)
Hematocrit: 22.8 % — ABNORMAL LOW (ref 36.0–48.0)
Hemoglobin: 6.6 g/dL — CL (ref 12.0–16.0)
Hemoglobin: 7.3 g/dL — ABNORMAL LOW (ref 12.0–16.0)
Lymphocytes %: 9.9 %
Lymphocytes %: 10.5 %
Lymphocytes Absolute: 1.5 K/uL (ref 1.0–5.1)
Lymphocytes Absolute: 1.7 10*3/uL (ref 1.0–5.1)
MCH: 25.1 pg — ABNORMAL LOW (ref 26.0–34.0)
MCH: 24.8 pg — ABNORMAL LOW (ref 26.0–34.0)
MCHC: 32.8 g/dL (ref 31.0–36.0)
MCHC: 31.8 g/dL (ref 31.0–36.0)
MCV: 76.4 fL — ABNORMAL LOW (ref 80.0–100.0)
MCV: 78 fL — ABNORMAL LOW (ref 80.0–100.0)
MPV: 8.3 fL (ref 5.0–10.5)
MPV: 8.3 fL (ref 5.0–10.5)
Monocytes %: 8.2 %
Monocytes %: 9 %
Monocytes Absolute: 1.3 K/uL (ref 0.0–1.3)
Monocytes Absolute: 1.4 10*3/uL — ABNORMAL HIGH (ref 0.0–1.3)
Neutrophils %: 81.2 %
Neutrophils %: 79.8 %
Neutrophils Absolute: 12.5 K/uL — ABNORMAL HIGH (ref 1.7–7.7)
Neutrophils Absolute: 12.6 10*3/uL — ABNORMAL HIGH (ref 1.7–7.7)
Platelets: 388 K/uL (ref 135–450)
Platelets: 411 10*3/uL (ref 135–450)
RBC: 2.64 M/uL — ABNORMAL LOW (ref 4.00–5.20)
RBC: 2.93 M/uL — ABNORMAL LOW (ref 4.00–5.20)
RDW: 16.5 % — ABNORMAL HIGH (ref 12.4–15.4)
RDW: 16.7 % — ABNORMAL HIGH (ref 12.4–15.4)
WBC: 15.4 K/uL — ABNORMAL HIGH (ref 4.0–11.0)
WBC: 15.8 10*3/uL — ABNORMAL HIGH (ref 4.0–11.0)

## 2024-03-29 LAB — POCT GLUCOSE
POC Glucose: 169 mg/dL — ABNORMAL HIGH (ref 70–99)
POC Glucose: 168 mg/dL — ABNORMAL HIGH (ref 70–99)
POC Glucose: 291 mg/dL — ABNORMAL HIGH (ref 70–99)
POC Glucose: 365 mg/dL — ABNORMAL HIGH (ref 70–99)

## 2024-03-29 LAB — RHEUMATOID FACTOR: Rheumatoid Factor: 13.4 [IU]/mL (ref <14)

## 2024-03-29 LAB — MAGNESIUM
Magnesium: 2.25 mg/dL (ref 1.80–2.40)
Magnesium: 2.07 mg/dL (ref 1.80–2.40)
Magnesium: 2.32 mg/dL (ref 1.80–2.40)

## 2024-03-29 LAB — TYPE AND SCREEN
ABO/Rh: B POS
Antibody Screen: NEGATIVE

## 2024-03-29 LAB — SEDIMENTATION RATE: Sed Rate, Automated: 79 mm/h — ABNORMAL HIGH (ref 0–20)

## 2024-03-29 LAB — CULTURE, BLOOD, PCR ID PANEL RESULTS REPORT

## 2024-03-29 MED ORDER — INSULIN GLARGINE 100 UNIT/ML SC SOLN
100 | Freq: Every evening | SUBCUTANEOUS | Status: DC
Start: 2024-03-29 — End: 2024-04-01
  Administered 2024-03-30 – 2024-04-01 (×3): 30 [IU] via SUBCUTANEOUS

## 2024-03-29 MED ORDER — LIDOCAINE 4 % EX PTCH
4 | Freq: Once | CUTANEOUS | Status: AC
Start: 2024-03-29 — End: 2024-03-29
  Administered 2024-03-29: 14:00:00 1 via TRANSDERMAL

## 2024-03-29 MED ORDER — SODIUM CHLORIDE 0.9 % IV SOLN
0.9 | Freq: Once | INTRAVENOUS | Status: AC
Start: 2024-03-29 — End: 2024-03-29
  Administered 2024-03-29: 16:00:00 2000 mg/kg via INTRAVENOUS

## 2024-03-29 MED ORDER — PANTOPRAZOLE SODIUM 40 MG PO TBEC
40 | Freq: Every day | ORAL | Status: DC
Start: 2024-03-29 — End: 2024-04-07
  Administered 2024-03-30 – 2024-04-07 (×9): 40 mg via ORAL

## 2024-03-29 MED ORDER — INSULIN LISPRO 100 UNIT/ML IJ SOLN
100 | Freq: Four times a day (QID) | INTRAMUSCULAR | Status: DC
Start: 2024-03-29 — End: 2024-03-29

## 2024-03-29 MED ORDER — LACTATED RINGERS IV SOLN
INTRAVENOUS | Status: AC
Start: 2024-03-29 — End: 2024-03-30
  Administered 2024-03-29: 22:00:00 via INTRAVENOUS

## 2024-03-29 MED ORDER — INSULIN GLARGINE 100 UNIT/ML SC SOLN
100 | Freq: Every evening | SUBCUTANEOUS | Status: DC
Start: 2024-03-29 — End: 2024-03-29
  Administered 2024-03-29: 05:00:00 20 [IU] via SUBCUTANEOUS

## 2024-03-29 MED ORDER — POTASSIUM BICARB-CITRIC ACID 20 MEQ PO TBEF
20 | Freq: Once | ORAL | Status: AC
Start: 2024-03-29 — End: 2024-03-29
  Administered 2024-03-29: 14:00:00 40 meq via ORAL

## 2024-03-29 MED ORDER — SODIUM CHLORIDE 0.9 % IV SOLN
0.9 | Freq: Two times a day (BID) | INTRAVENOUS | Status: DC
Start: 2024-03-29 — End: 2024-03-29

## 2024-03-29 MED ORDER — INSULIN LISPRO 100 UNIT/ML IJ SOLN
100 | Freq: Three times a day (TID) | INTRAMUSCULAR | Status: DC
Start: 2024-03-29 — End: 2024-04-03
  Administered 2024-03-29 – 2024-04-03 (×12): 7 [IU] via SUBCUTANEOUS

## 2024-03-29 MED ORDER — INSULIN LISPRO 100 UNIT/ML IJ SOLN
100 | Freq: Four times a day (QID) | INTRAMUSCULAR | Status: DC
Start: 2024-03-29 — End: 2024-04-07
  Administered 2024-03-29: 16:00:00 8 [IU] via SUBCUTANEOUS
  Administered 2024-03-29 – 2024-03-30 (×3): 4 [IU] via SUBCUTANEOUS
  Administered 2024-03-30: 22:00:00 8 [IU] via SUBCUTANEOUS
  Administered 2024-03-30: 17:00:00 5 [IU] via SUBCUTANEOUS
  Administered 2024-03-30: 02:00:00 4 [IU] via SUBCUTANEOUS
  Administered 2024-03-31 (×2): 2 [IU] via SUBCUTANEOUS
  Administered 2024-03-31: 03:00:00 4 [IU] via SUBCUTANEOUS
  Administered 2024-04-01 – 2024-04-03 (×3): 2 [IU] via SUBCUTANEOUS
  Administered 2024-04-03: 23:00:00 4 [IU] via SUBCUTANEOUS
  Administered 2024-04-03: 14:00:00 6 [IU] via SUBCUTANEOUS
  Administered 2024-04-03 – 2024-04-04 (×2): 2 [IU] via SUBCUTANEOUS
  Administered 2024-04-04: 15:00:00 4 [IU] via SUBCUTANEOUS
  Administered 2024-04-04 – 2024-04-07 (×5): 2 [IU] via SUBCUTANEOUS

## 2024-03-29 MED ORDER — SODIUM CHLORIDE 0.9 % IV SOLN (ADDEASE)
0.9 | INTRAVENOUS | Status: DC
Start: 2024-03-29 — End: 2024-04-07
  Administered 2024-03-29 – 2024-04-07 (×53): 2000 mg via INTRAVENOUS

## 2024-03-29 MED ORDER — SODIUM CHLORIDE 0.9 % IV SOLN
0.9 | INTRAVENOUS | Status: DC | PRN
Start: 2024-03-29 — End: 2024-03-29

## 2024-03-29 MED FILL — INSULIN LISPRO 100 UNIT/ML IJ SOLN: 100 [IU]/mL | INTRAMUSCULAR | Qty: 2

## 2024-03-29 MED FILL — LEVOTHYROXINE SODIUM 50 MCG PO TABS: 50 ug | ORAL | Qty: 1

## 2024-03-29 MED FILL — ACETAMINOPHEN 325 MG PO TABS: 325 mg | ORAL | Qty: 2

## 2024-03-29 MED FILL — INSULIN LISPRO 100 UNIT/ML IJ SOLN: 100 [IU]/mL | INTRAMUSCULAR | Qty: 7

## 2024-03-29 MED FILL — ASPERCREME LIDOCAINE 4 % EX PTCH: 4 % | CUTANEOUS | Qty: 1

## 2024-03-29 MED FILL — CEFTRIAXONE SODIUM 1 G IJ SOLR: 1 g | INTRAMUSCULAR | Qty: 1000

## 2024-03-29 MED FILL — INSULIN LISPRO 100 UNIT/ML IJ SOLN: 100 [IU]/mL | INTRAMUSCULAR | Qty: 4

## 2024-03-29 MED FILL — VANCOMYCIN HCL 10 G IV SOLR: 10 g | INTRAVENOUS | Qty: 2000

## 2024-03-29 MED FILL — NAFCILLIN SODIUM 2 G IJ SOLR: 2 g | INTRAMUSCULAR | Qty: 2000

## 2024-03-29 MED FILL — ENOXAPARIN SODIUM 40 MG/0.4ML IJ SOSY: 40 MG/0.4ML | INTRAMUSCULAR | Qty: 0.4

## 2024-03-29 MED FILL — SODIUM PHOSPHATES 15 MMOLE/5ML IV SOLN: 15 MMOLE/5ML | INTRAVENOUS | Qty: 5

## 2024-03-29 MED FILL — PANTOPRAZOLE SODIUM 40 MG IV SOLR: 40 mg | INTRAVENOUS | Qty: 40

## 2024-03-29 MED FILL — EFFER-K 20 MEQ PO TBEF: 20 meq | ORAL | Qty: 2

## 2024-03-29 MED FILL — INSULIN LISPRO 100 UNIT/ML IJ SOLN: 100 [IU]/mL | INTRAMUSCULAR | Qty: 8

## 2024-03-29 MED FILL — LANTUS 100 UNIT/ML SC SOLN: 100 [IU]/mL | SUBCUTANEOUS | Qty: 20

## 2024-03-29 NOTE — Consults (Signed)
"  [facility] - Clinical Pharmacy Note    Pharmacy was consulted to dose vancomycin .    Vancomycin  therapy has been discontinued. Pharmacy will sign off at this time. Please consider consulting pharmacy again if vancomycin  is re-started.    Thank you for allowing us  to participate in the care of this patient.    Rockey Kurk, PharmD  Main Pharmacy: [phone]  03/29/2024 2:38 PM    "

## 2024-03-29 NOTE — Progress Notes (Signed)
 "    V2.0    [facility] Progress Note      Name:  Shelly Richard DOB/Age/Sex: 09/12/1985  (38 y.o. female)   MRN & CSN:  [id number] & [id number] Encounter Date/Time: 03/29/2024 7:30 PM EST   Location:  4516/4516-01 PCP: Alexandro Bernice FORBES, [name]     Attending:[facility] 4    Assessment and Recommendations     38yo female with Hx T2DM, HLD, HTN, presenting with HA, SOB, and admitted with DKA after not being able to take meds 2/2 medical insurance. Has been on ICU on insulin  drip since admission. Suspected UTI and persistent sinus tachycardia for which she has been started on Rocephin . Also with Decreased TSH with adjustments in Synthroid  levels. Is growing MSSA in back to back blood cultures. On IV Abx.     DKA  Hx of DM  -Possibly 2/2 failure to administer insulin  in the setting of lack of medical insurance. Off insulin  infusion.   -Continue SubQ regimen. Monitor for hypoglycemia.    Persistent sinus tachycardia  Possible staph UTI  Possible sepsis  -Previously on Rocephin . Now on Ancef . Follow up sensitivities.    MSSA bacteremia  -Continue Ancef .     Microcytic anemia  -Monitor closely. Iron  studies as ordered. Follow up results.       HLD    HTN    Hypothyroidism  -Continue Synthroid  at adjusted dose.     Diet ADULT DIET; Regular; 3 carb choices (45 gm/meal)   DVT Prophylaxis []  Lovenox , []   Heparin, []  SCDs, []  Ambulation,  []  Eliquis, []  Xarelto  []  Coumadin   Code Status Full Code   Disposition From: Home  Expected Disposition: Home  Estimated Date of Discharge: TBD  Patient requires continued admission due to Microcytic anemia.    Surrogate Decision Maker/ POA  [place] listed as primary contact.      Personally reviewed Lab Studies and Imaging     Discussed management of the case with case management. I reviewed ICU progress note.         Subjective:     Patient seen and evaluated at bedside. Feels tired.       Review of Systems:      Pertinent positives and negatives  discussed in HPI    Objective:     Intake/Output Summary (Last 24 hours) at 03/29/2024 1834  Last data filed at 03/29/2024 1400  Gross per 24 hour   Intake 7450.2 ml   Output 4000 ml   Net 3450.2 ml      Vitals:   Vitals:    03/29/24 1400 03/29/24 1500 03/29/24 1600 03/29/24 1700   BP: 125/70 135/73 (!) 146/76 134/75   Pulse: (!) 134 (!) 136 (!) 143 (!) 141   Resp: 12 22 24 14    Temp:       TempSrc:       SpO2:  100% 95% 97%   Weight:       Height:             Physical Exam:      General: NAD  Eyes: EOMI  ENT: neck supple  Cardiovascular: Tachycardic  Respiratory: Clear to auscultation  Gastrointestinal: Soft, non tender  Genitourinary: no suprapubic tenderness  Musculoskeletal: No edema  Skin: warm, dry  Neuro: Alert.  Psych: Mood appropriate.         Medications:   Medications:    insulin  glargine  30 Units SubCUTAneous Nightly    insulin  lispro  0-8 Units SubCUTAneous 4x Daily AC & HS    lidocaine   1 patch TransDERmal Once    [START ON 03/30/2024] pantoprazole   40 mg Oral QAM AC    nafcillin   2,000 mg IntraVENous Q4H    levothyroxine   25 mcg Oral Daily    insulin  lispro  7 Units SubCUTAneous TID WC    sodium chloride  flush  5-40 mL IntraVENous 2 times per day    enoxaparin   40 mg SubCUTAneous Daily      Infusions:    lactated ringers  100 mL/hr at 03/29/24 1649    sodium chloride       dextrose  5 % and 0.45 % NaCl Stopped (03/28/24 2303)    dextrose        PRN Meds: dextrose  bolus, 125 mL, PRN   Or  dextrose  bolus, 250 mL, PRN  magnesium  sulfate, 2,000 mg, PRN  sodium phosphate  15 mmol in sodium chloride  0.9 % 250 mL IVPB, 15 mmol, PRN  sodium chloride  flush, 5-40 mL, PRN  sodium chloride , , PRN  magnesium  sulfate, 2,000 mg, PRN  ondansetron , 4 mg, Q8H PRN   Or  ondansetron , 4 mg, Q6H PRN  polyethylene glycol, 17 g, Daily PRN  acetaminophen , 650 mg, Q6H PRN   Or  acetaminophen , 650 mg, Q6H PRN  dextrose  5 % and 0.45 % NaCl, , Continuous PRN  glucose, 4 tablet, PRN  glucagon , 1 mg, PRN  dextrose , , Continuous  PRN  albuterol , 2.5 mg, Q6H PRN  HYDROmorphone , 0.25 mg, Q3H PRN   Or  HYDROmorphone , 0.5 mg, Q3H PRN        Labs and Imaging   CT SOFT TISSUE NECK WO CONTRAST  Result Date: 03/28/2024  DATE: March 28, 2024 EXAM: CT SOFT TISSUE NECK WO CONTRAST INDICATION: neck pain, fever; COMPARISON: None. TECHNIQUE: Axial CT imaging obtained from the skull base through the lung apices. Axial images and multiplanar reformatted images reviewed.   Up-to-date CT equipment and radiation dose reduction techniques were employed. IV Contrast: None FINDINGS: SCOUT: Nondiagnostic scout image(s) obtained for localization purposes, not for diagnostic use DEVICES/HARDWARE: None . ORBITS, PARANASAL SINUSES, SKULL BASE: Limited images of the base of the brain demonstrate no mass effect. Orbits are normal. Imaged sinuses are clear. CERVICAL SPINE/BONES: No destructive process. NASAL CAVITY/NASOPHARYNX: No mass. ORAL CAVITY/OROPHARYNX: No evidence of mass. HYPOPHARYNX AND LARYNX: Normal. DEEP NECK SPACES: The parapharyngeal, retropharyngeal and masticator spaces are normal. SALIVARY GLANDS: Submandibular glands, sublingual glands and parotid glands are normal. THYROID GLAND: Normal. LYMPH NODES: Nonenlarged cervical lymph nodes bilaterally. THORACIC INLET: No mass or acute abnormality involving the visualized mediastinal structures, trachea or lung apices. OTHER: No significant abnormality.     1. Negative. This dictation was created with voice recognition software.  While attempts have been made to review the dictation as it was transcribed, occasionally the spoken word can be misinterpreted  by the technology, leading to omissions or inappropriate words or phrases.  If questions or concerns arise, please contact the Radiology Department. Electronically signed by Elouise Hugh, MD    CT ABDOMEN PELVIS WO CONTRAST Additional Contrast? None  Result Date: 03/28/2024  DATE: 03/28/2024 EXAM: CT ABDOMEN PELVIS WO CONTRAST INDICATION: hip pain Pain  COMPARISON: 2015 TECHNIQUE: Axial CT imaging obtained from lung bases through pelvis. Axial images and multiplanar reformatted images are provided for review.  Up-to-date CT equipment and radiation dose reduction techniques were employed. IV Contrast: None Oral Contrast: No. FINDINGS: SCOUT: Nondiagnostic  scout image(s) obtained for localization purposes, not for diagnostic use DEVICES/HARDWARE: None . LUNG BASES: Clear. LIVER: There is decreased in attenuation. Liver is up to 25 cm in craniocaudal dimension. GALLBLADDER AND BILIARY TREE: No calcified gallstones. No gallbladder distention.  No intra- or extrahepatic biliary dilatation. PANCREAS: Normal. SPLEEN: Spleen is up to 14 cm in craniocaudal dimension. ADRENAL GLANDS: Normal. KIDNEYS AND URETERS: No hydronephrosis. Focal areas of striated enhancement of the kidneys bilaterally. Mild perinephric haziness bilaterally without fluid collection. BOWEL: Normal diameter, nonobstructed.  Appendix is not dilated or thickened. Distal colonic diverticulosis. URINARY BLADDER: Normal. REPRODUCTIVE ORGANS: No associated masses. LYMPH NODES: No abnormally enlarged nodes. PERITONEUM/RETROPERITONEUM: No ascites or free air. VESSELS: Aorta and IVC without significant abnormality.  ABDOMINAL WALL: Mild periarticular edema or swelling of the left hip. BONES: No significant abnormality. OTHER FINDINGS: None.     1. Diverticulosis of the colon without evidence of diverticulitis. 2. Focal areas of striated enhancement of the kidneys, previous CT contrast administration. Findings could indicate renal dysfunction or infection. 3. Mild periarticular edema or swelling of the left hip suggesting muscular injury/strain. 4. Hepatosplenomegaly. This dictation was created with voice recognition software.  While attempts have been made to review the dictation as it was transcribed, occasionally the spoken word can be misinterpreted  by the technology, leading to omissions or inappropriate  words or phrases.  If questions or concerns arise, please contact the Radiology Department. Electronically signed by Elouise Hugh, MD    CT HEAD WO CONTRAST  Result Date: 03/28/2024  EXAM: CT HEAD WO CONTRAST STUDY DATE: 03/28/2024 11:15 EST INDICATION: neck pain COMPARISON: None TECHNIQUE: Standard per department protocol. Up-to-date CT equipment and radiation dose reduction techniques were employed. CONTRAST: No intravenous FINDINGS: Brain Parenchyma: No midline shift, mass effect, parenchymal hemorrhage, or evidence of acute territorial infarct. Ventricular System and Extra-Axial Spaces: No extra-axial fluid collections. Basilar cisterns are patent. No hydrocephalus. Extracranial Structures: No significant paranasal sinus disease. No calvarial abnormality. No orbit abnormality.     *  No acute intracranial findings. Electronically signed by Isom Repress    CT CHEST PULMONARY EMBOLISM W CONTRAST  Result Date: 03/27/2024  DATE: 03/27/2024 EXAM: CT CHEST PULMONARY EMBOLISM W CONTRAST INDICATION: c/f PE, Chest pain COMPARISON: None. TECHNIQUE: Axial CT imaging obtained through the chest using CT pulmonary angiogram protocol. Axial images, multiplanar reformatted images and maximum intensity projection images were reviewed.  Up-to-date CT equipment and radiation dose reduction techniques were employed.  IV Contrast Media and volume: 80 cc iodinated contrast FINDINGS: SCOUT: Nondiagnostic scout image(s) obtained for localization purposes, not for diagnostic use DEVICES/HARDWARE: None . DIAGNOSTIC QUALITY: Suboptimal. PULMONARY EMBOLI: None. RIGHT HEART STRAIN: None. LUNGS AND AIRWAYS: Airways are patent.  Lungs are clear. PLEURA: No pleural effusions or significant pleural thickening. HEART / GREAT VESSELS: Heart is not enlarged. ADENOPATHY: None. CHEST WALL / LOWER NECK: No significant abnormality. UPPER ABDOMEN: Normal. BONES: No significant abnormality. OTHER FINDINGS: None.     1. No evidence of pulmonary  embolus. This dictation was created with voice recognition software.  While attempts have been made to review the dictation as it was transcribed, occasionally the spoken word can be misinterpreted  by the technology, leading to omissions or inappropriate words or phrases.  If questions or concerns arise, please contact the Radiology Department. Electronically signed by Elouise Hugh, MD    XR CHEST PORTABLE  Result Date: 03/27/2024  Chest HISTORY : Dyspnea VIEWS : 1     Clear lungs. Normal cardiac mediastinal silhouette.  Electronically signed by Lynwood Schwalbe, MD      CBC:   Recent Labs     03/28/24  0414 03/29/24  0225 03/29/24  0452   WBC 16.4* 15.4* 15.8*   HGB 8.7* 6.6* 7.3*   PLT 455* 388 411     BMP:    Recent Labs     03/28/24  2307 03/29/24  0225 03/29/24  1448   NA 136 132* 133*  132*   K 3.5 3.6 4.2  4.0   CL 102 101 100  100   CO2 23 21 18*  20*   BUN 4* 4* 5*  5*   CREATININE 0.6 0.6 0.6  0.6   GLUCOSE 144* 295* 255*  257*     Hepatic: No results for input(s): AST, ALT, BILITOT, ALKPHOS in the last 72 hours.    Invalid input(s): ALB  Lipids:   Lab Results   Component Value Date/Time    CHOL 154 11/25/2023 02:44 PM    HDL 22 11/25/2023 02:44 PM    TRIG 170 11/25/2023 02:44 PM     Hemoglobin A1C:   Lab Results   Component Value Date/Time    LABA1C 13.9 03/27/2024 03:47 AM     TSH:   Lab Results   Component Value Date/Time    TSH 1.92 08/27/2022 04:35 PM     Troponin: No results found for: TROPONINT  Lactic Acid:   Recent Labs     03/26/24  2331 03/28/24  0821 03/28/24  1520   LACTA 1.6 2.4* 2.1*     BNP: No results for input(s): PROBNP in the last 72 hours.  UA:  Lab Results   Component Value Date/Time    NITRU Negative 03/28/2024 02:03 AM    COLORU Yellow 03/28/2024 02:03 AM    PHUR 6.0 03/28/2024 02:03 AM    PHUR 5.0 03/28/2024 02:03 AM    PHUR 5.5 02/25/2017 12:22 PM    WBCUA 3-5 03/28/2024 02:03 AM    RBCUA 5-10 03/28/2024 02:03 AM    BACTERIA 3+ 03/28/2024 02:03 AM    CLARITYU  Clear 03/28/2024 02:03 AM    LEUKOCYTESUR TRACE 03/28/2024 02:03 AM    UROBILINOGEN 0.2 03/28/2024 02:03 AM    BILIRUBINUR Negative 03/28/2024 02:03 AM    BLOODU SMALL 03/28/2024 02:03 AM    GLUCOSEU Negative 03/28/2024 02:03 AM    KETUA Negative 03/28/2024 02:03 AM     Urine Cultures:   Lab Results   Component Value Date/Time    LABURIN  03/28/2024 01:54 AM     25,000 CFU/ml  Sensitivity to follow  PBP2= Negative       Blood Cultures:   Lab Results   Component Value Date/Time    [facility]  03/27/2024 11:42 PM     Gram stain Aerobic bottle:  Gram positive cocci in clusters  resembling Staphylococcus  Information to follow  Gram stain Anaerobic bottle:  Gram positive cocci in clusters  resembling Staphylococcus  Information to follow      [facility]  03/27/2024 11:42 PM     mecA gene not detected  See additional report for complete BCID panel.  No resistance genes detected.  Negative results for this marker do not indicate susceptibility,  as multiple mechanisms of resistance to Methicillin exist.  Please see full susceptibility report.       Lab Results   Component Value Date/Time    BLOODCULT2  03/27/2024 11:42 PM     Gram stain Aerobic bottle:  Gram positive  cocci in clusters  resembling Staphylococcus  Information to follow  Gram stain Anaerobic bottle:  Gram positive cocci in clusters  resembling Staphylococcus  Information to follow       Organism:   Lab Results   Component Value Date/Time    ORG Staphylococcus aureus 03/28/2024 01:54 AM         Electronically signed by Sheree CHRISTELLA Evern Marylyn, MD on 03/29/2024 at 6:34 PM    "

## 2024-03-29 NOTE — Progress Notes (Addendum)
"  Pt Hemoglobin 6.6 for 12/15 morning labs, hemoglobin 12/14 was 8.7. Residents aware and at bedside, no noticeable hematoma on abdomen. Labs ordered to be redrawn: CBC and type and screen.      0500: redraw showed hemoglobin 7.3, no blood transfusion at this time   "

## 2024-03-29 NOTE — Progress Notes (Addendum)
 "     ICU Progress Note    Admit Date: 03/26/2024  Hospital Day:  Hospital Day: 4  ICU Day:      Interval History     Shelly Richard, her HR is still running high (140s), no chest pain, or SOB. She denied nausea, vomiting but had decreased appetite while having breakfast this morning.  She had left hip/buttock pain, was able to move her left extremity, normal range of motion but was painful on touch    Labs showed : Sodium 132, potassium 3.6, normal creatinine, magnesium  2.07.  Sugars still running high 295.  CBC revealed hemoglobin 7.3, before that it was 6.6 most likely lab error as repeat was 7.3.  Leukocytosis with WBC count of 15.8 (22.7-> 16.1 ->16.4 -> 15.4-> 15.8).   Blood culture sent on 12/13/025 grew MRSA today     Updated Plan:   - Apply lidocaine  patch on left hip  - Replacing electrolytes as needed  - Started Vancomycin     Brief H & P     [name] is a 38 y.o. female with a medical hx significant for type 2 diabetes, mixed hyperlipidemia, and mild hypertension, otherwise as listed in the MHx table below, who presented from home to the ED on 03/27/2024, here with headache and shortness of breath. Patient reportedly has not had health insurance coverage since September, and as a result has not been taking any of her medications.  She has been having 1 week history of difficulty breathing. She also has a 25-month history of headache that begins at the base of her neck and radiates to bilateral temples, left more than right. She has had previous hospitalizations for her headache and she was treated with pain medications and robaxin . She notes that this did not help very much. Patient also notes to have had increased polydipsia, and urinary frequency. Patient also had not checked her blood sugars recently, last time she checked was around August when sugars were ranging in 150s to high 250s. Patient notes she did not have any issues with medications when she was taking them. Denies any changes in vision,  photophobia, nausea, vomiting, motor weakness, sensory loss, chest pain, abdominal pain, hematuria or blood in stool.      On presentarion patient was found to be tachycardic and hypertensive, with a glucose of 418, anion gap metabolic acidosis. Elevated white count. Urine ketoacidosis. CXR was unremarkable.     ROS:   Review of Systems   Constitutional:  Positive for appetite change (Decreased) and fever.   HENT: Negative.     Eyes: Negative.    Respiratory: Negative.     Cardiovascular: Negative.    Gastrointestinal: Negative.    Endocrine: Negative.    Genitourinary: Negative.    Musculoskeletal: Negative.    Skin: Negative.    Neurological:  Positive for dizziness and light-headedness.   Psychiatric/Behavioral: Negative.           Objective     MEDICATIONS:  Scheduled:     insulin  glargine  30 Units SubCUTAneous Nightly    insulin  lispro  0-8 Units SubCUTAneous 4x Daily AC & HS    cefTRIAXone  (ROCEPHIN ) IV  1,000 mg IntraVENous Q24H    levothyroxine   25 mcg Oral Daily    insulin  lispro  7 Units SubCUTAneous TID WC    sodium chloride  flush  5-40 mL IntraVENous 2 times per day    enoxaparin   40 mg SubCUTAneous Daily    pantoprazole  (PROTONIX ) 40  mg in sodium chloride  (PF) 0.9 % 10 mL injection  40 mg IntraVENous Daily     Continuous Infusions:     sodium chloride       dextrose  5 % and 0.45 % NaCl Stopped (03/28/24 2309)    dextrose        PRN:  dextrose  bolus **OR** dextrose  bolus, potassium chloride , magnesium  sulfate, sodium phosphate  15 mmol in sodium chloride  0.9 % 250 mL IVPB, sodium chloride  flush, sodium chloride , potassium chloride  **OR** potassium alternative oral replacement **OR** potassium chloride , magnesium  sulfate, ondansetron  **OR** ondansetron , polyethylene glycol, acetaminophen  **OR** acetaminophen , dextrose  5 % and 0.45 % NaCl, glucose, glucagon , dextrose , albuterol , HYDROmorphone  **OR** HYDROmorphone     VITALS:   Patient Vitals for the past 5 hrs:   BP Pulse Resp SpO2 Weight   03/29/24 0600  132/73 (!) 140 20 98 % 80.2 kg (176 lb 12.9 oz)   03/29/24 0500 127/74 (!) 134 21 97 % --   03/29/24 0400 131/74 (!) 135 21 100 % --   03/29/24 0300 122/64 (!) 137 17 97 % --         Intake/Output Summary (Last 24 hours) at 03/29/2024 0723  Last data filed at 03/29/2024 0600  Gross per 24 hour   Intake 8564 ml   Output 4050 ml   Net 4514 ml         PHYSICAL EXAM:   Physical Exam  Constitutional:       Appearance: She is ill-appearing.      Comments: Looked tired    HENT:      Mouth/Throat:      Pharynx: Oropharynx is clear.   Eyes:      Extraocular Movements: Extraocular movements intact.      Conjunctiva/sclera: Conjunctivae normal.      Pupils: Pupils are equal, round, and reactive to light.   Cardiovascular:      Rate and Rhythm: Regular rhythm. Tachycardia present.      Heart sounds: Normal heart sounds.   Pulmonary:      Effort: Pulmonary effort is normal.      Breath sounds: Normal breath sounds.   Abdominal:      General: Bowel sounds are normal.      Palpations: Abdomen is soft.   Musculoskeletal:         General: Tenderness (Left hip) present.      Cervical back: Normal range of motion.   Skin:     General: Skin is dry.   Neurological:      General: No focal deficit present.      Mental Status: She is oriented to person, place, and time. Mental status is at baseline.   Psychiatric:         Mood and Affect: Mood normal.         Behavior: Behavior normal.         Thought Content: Thought content normal.         Judgment: Judgment normal.           LABS:  ABGs:  No results for input(s): PHART, PCO2ART, PO2ART, HCO3ART, BEART, THGBART, O2SATART, TCO2ART in the last 72 hours.     VBGs:  Recent Labs     03/26/24  2331 03/27/24  0625   PHVEN 7.196* 7.313*   PCO2VEN 28.5* 33.2*   PO2VEN <30.0 24   HCO3VEN 11.0* 16.8*   BEVEN -15.7* -9*   O2SATVEN 45 38       CBC:   Recent Labs  03/28/24  0414 03/29/24  0225 03/29/24  0452   WBC 16.4* 15.4* 15.8*   HGB 8.7* 6.6* 7.3*   HCT 26.9* 20.2* 22.8*   MCV  78.2* 76.4* 78.0*   PLT 455* 388 411     Renal:    Recent Labs     03/28/24  1520 03/28/24  2307 03/29/24  0225   NA 132* 136 132*   K 3.6 3.5 3.6   CL 100 102 101   CO2 21 23 21    BUN 4* 4* 4*   CREATININE 0.6 0.6 0.6   GLUCOSE 157* 144* 295*   CALCIUM  8.8 8.7 8.2*   MG 1.75* 2.25 2.07   PHOS 1.7* 2.0* 2.7   ANIONGAP 11 11 10      Hepatic: No results for input(s): AST, ALT, BILITOT, BILIDIR, LABALBU, ALKPHOS in the last 72 hours.    Invalid input(s): PROT  Troponin:   Recent Labs     03/28/24  0413   TROPHS <6       NT-proBNP:  No results for input(s): PROBNP in the last 72 hours.  BNP: No results for input(s): BNP in the last 72 hours.  Lipids: No results for input(s): CHOL, HDL in the last 72 hours.    Invalid input(s): LDLCALCU, TRIGLYCERIDE  INR: No results for input(s): INR in the last 72 hours.  Lactate:   Recent Labs     03/27/24  0625   LACTATE 0.75     Lactic acid:  Invalid input(s): LACTICACID  ESR: No results for input(s): SEDRATE in the last 72 hours.  CRP:  No results for input(s): CRP in the last 72 hours.      MICROBIOLOGY:  Previous Cultures:       Results       Procedure Component Value Units Date/Time    Culture, Urine [[id number]]     Order Status: Sent Specimen: Urine, Clean Catch     Urine Culture [[id number]] Collected: 03/28/24 0154    Order Status: Sent Specimen: Urine voided Updated: 03/28/24 1113    Culture, Blood 1 [[id number]] Collected: 03/27/24 2342    Order Status: Completed Specimen: Blood Updated: 03/29/24 0115     Blood Culture, Routine No Growth to date.  Any change in status will be called.    Narrative:      ORDER#: [id number]                          ORDERED BY: GENSIC, ANNA  SOURCE: Blood                              COLLECTED:  03/27/24 23:42  ANTIBIOTICS AT COLL.:                      RECEIVED :  03/28/24 19:23  If child <=2 yrs old please draw pediatric bottle.~Blood Culture 1    Culture, Blood 2 [[id number]] Collected: 03/27/24 2342    Order  Status: Completed Specimen: Blood Updated: 03/29/24 0115     Culture, Blood 2 No Growth to date.  Any change in status will be called.    Narrative:      ORDER#: [id number]                          ORDERED BY: GENSIC, ANNA  SOURCE: Blood  COLLECTED:  03/27/24 23:42  ANTIBIOTICS AT COLL.:                      RECEIVED :  03/28/24 19:23  If child <=2 yrs old please draw pediatric bottle.~Blood Culture #2    COVID-19 & Influenza Combo [[id number]] Collected: 03/27/24 1607    Order Status: Completed Specimen: Nasopharyngeal Swab Updated: 03/27/24 1741     SARS-CoV-2 RNA, RT PCR NOT DETECTED     Comment: Not Detected results do not preclude SARS-CoV-2 infection and  should not be used as the sole basis for patient management  decisions.  Results must be combined with clinical observations,  patient history, and epidemiological information.    Testing was performed using [employer] SARS-CoV-2 and Influenza A/B  nucleic acid assay. This test is a multiplex Real-Time Reverse  Transcriptase Polymerase Chain Reaction (RT-PCR)-based in vitro  diagnostic test intended for the qualitative detection of nucleic  acids from SARS-CoV-2, influenza A, and influenza B in nasopharyngeal  and nasal swab specimens.    Patient Fact Sheet:  salonclasses.at  Provider Fact Sheet: coursepreviews.dk  EUA: forexcoupons.hu  IFU: webcrashers.at    Methodology:  RT-PCR          Influenza A NOT DETECTED     Influenza B NOT DETECTED               RADIOLOGY:   CT ABDOMEN PELVIS WO CONTRAST Additional Contrast? None   Final Result      1. Diverticulosis of the colon without evidence of diverticulitis.   2. Focal areas of striated enhancement of the kidneys, previous CT contrast administration. Findings could indicate renal dysfunction or infection.   3. Mild periarticular edema or swelling of the left hip suggesting muscular  injury/strain.   4. Hepatosplenomegaly.            This dictation was created with voice recognition software.  While attempts have been made to review the dictation as it was transcribed, occasionally the spoken word can be misinterpreted  by the technology, leading to omissions or inappropriate words    or phrases.  If questions or concerns arise, please contact the Radiology Department.      Electronically signed by Elouise Hugh, MD      CT SOFT TISSUE NECK WO CONTRAST   Final Result      1. Negative.            This dictation was created with voice recognition software.  While attempts have been made to review the dictation as it was transcribed, occasionally the spoken word can be misinterpreted  by the technology, leading to omissions or inappropriate words    or phrases.  If questions or concerns arise, please contact the Radiology Department.      Electronically signed by Elouise Hugh, MD      CT HEAD WO CONTRAST   Final Result   *  No acute intracranial findings.      Electronically signed by Isom Repress      CT CHEST PULMONARY EMBOLISM W CONTRAST   Final Result      1. No evidence of pulmonary embolus.            This dictation was created with voice recognition software.  While attempts have been made to review the dictation as it was transcribed, occasionally the spoken word can be misinterpreted  by the technology, leading to omissions or inappropriate words    or phrases.  If  questions or concerns arise, please contact the Radiology Department.      Electronically signed by Elouise Hugh, MD      XR CHEST PORTABLE   Final Result      Clear lungs.      Normal cardiac mediastinal silhouette.      Electronically signed by Lynwood Schwalbe, MD            ASSESSMENT & PLAN   [name] is a 38 y.o. female with a medical hx significant for type 2 diabetes, mixed hyperlipidemia, and mild hypertension, otherwise as listed in the MHx table below, who presented from home to the ED on 03/27/2024,  here with headache and shortness of breath.      #Diabetic Ketoacidosis Type 2 (hbA1c was 13.9 two days ago)  #High Anion Gap Metabolic Acidosis  (resolved)  #Headache   #Elevated White Count   Patient with poorly controlled type 2 diabetes mellitus presenting with diabetic ketoacidosis (DKA), evidenced by hyperglycemia, elevated anion gap metabolic acidosis, ketonuria, and low bicarbonate. Delta gap was between 1-2, meaning pure, uncomplicated HAGMA. DKA is most likely secondary to medication nonadherence due to lack of insurance coverage. Patient also reported headache, likely related to metabolic derangements from DKA and generalized medication noncompliance, including antihypertensive therapy. Leukocytosis was present and was suspected to be reactive in the setting of DKA, with no clear infectious source identified at this time.   PLAN:    - AG closed x2 on 03/27/2024   - Insulin  drip transitioned to Lantus  40 nightly , lispro 7 units 3 times daily with meals    #Possible sepsis  #Concerns for UTI  #MRSA bacteremia   She had multiple episodes of fever with Tmax 101.3 ( 03/29/2024). Also had tachycardia with HR upto 140s, tachypnea (30s), meeting the SIRS criteria for sepsis.  Urinalysis was positive for 3+ bacteriuria with trace leukocyte esterase. Also had elevated WBC count. Otherwise infectious workup has been negative so far.  Blood culture sent on 03/27/2024 are NGTD.  Chest x-ray was unremarkable, D dimer elevated, although CT chest showed no evidence of pulmonary embolism CT soft tissue of neck was negative for any acute pathology, CT abdomen pelvis showed diverticulosis of colon without evidence of diverticulitis.  PLAN:   - Started empirically on ceftriaxone  (03/28/2024 ), vancomycin  was started on 03/29/2024 after positive blood culture  - Ordered blood culture ; MRSA positive   - Ordered Urine cultures; pending      #Tachycardia likely multifactorial  Although she has history of chronic tachycardia as  per chart review, but her current tachycardia could be a combination of underlying infection, increase thyroxine dose. EKG showed sinus tachycardia without ischemic changes. She also has anemia and that could be leading to tachycardia.    - Will continue to monitor heart rate     - Treating th underlying infection    Chronic Medical Conditions:  #Hypothyroidism   TSH was 0.25 one day ago. Normal t4.  -Continue Levothyroxine  25 mcg     #Hypertension   - Holding Losartan  in the setting of slight inc in cr from baseline on presentation.   - Will consider starting it if she has constant high BP.     #Hyperlipidemia   - Holding Simvastatin           Glycemic goal:  140-180 mg/dL  LDAs:    Infusions:  no continuous infusions  Abx: Ceftriaxone     Diet:  ADULT DIET; Regular; 3 carb choices (45  gm/meal)  GI PPx:    Protonix  40 mg IV qd  Bowel Regimen:    DVT PPx: Lovenox  40 mg     Code Status: Full Code     Disposition:   Prior to admission:  From Home  Current:  ICU  Upon discharge:  TBD      Muhammad Imtanan Fazal, MD, PGY-1  Internal Medicine Resident  Contact via [facility]     PULMONARY AND CRITICAL CARE ATTENDING:  Patient was seen, examined and discussed with Dr. Catarino PGY-1. I agree with the history of present illness, past medical/surgical histories, family history, social history, medication list and allergies as listed. The review of systems is as noted above. My physical exam confirms the findings listed above.   Chart was reviewed including Labs, CXR, CT scan, EKG, and Medical records confirm the findings noted above.   I edited the note where appropriate.     Briefly, this is a 38 y.o. female admitted to ICU with DKA  Reported recently became uninsured and this resulted in inability to procure medications leading to DKA.      INTERVAL HISTORY:    Kaisyn is still febrile to 101.  Tachycardia remains in the 130s but down from 150-180 yesterday  She states she started to feel little bit better  She was  transition from insulin  drip to Lantus  and lispro last night  She is able to tolerate p.o.  Blood cultures 2 out of 2 showing MSSA     BP 135/73   Pulse (!) 136   Temp 100 F (37.8 C) (Temporal)   Resp 22   Ht 1.626 m (5' 4)   Wt 80.2 kg (176 lb 12.9 oz)   LMP 03/23/2024   SpO2 100%   BMI 30.35 kg/m     Intake/Output Summary (Last 24 hours) at 03/29/2024 1636  Last data filed at 03/29/2024 1400  Gross per 24 hour   Intake 10195.2 ml   Output 4750 ml   Net 5445.2 ml        SpO2: 99 %  -  RA      Peripheral IV 03/26/24 Left;Proximal Forearm (Active)   Number of days: 0       Peripheral IV 03/27/24 Right Antecubital (Active)   Number of days: 0         ASSESSMENT:  MSSA bacteremia with sepsis- suspected 2/2 UTI  DKA -resolved  Hypothyroidism  Social health deterrants - uninsured     PLAN:    Change vancomycin  to nafcillin  2 g IV every 4 hours  Check echo given MSSA bacteremia  Continue Lantus  30 units nightly plus SSI  Advance diet as tolerated  Will start LR at 100 mL an hour x 24 hours given sepsis, fevers, and marginal intake  Antipyretics as needed    Diet: carb controlled  GI prophylaxis: elevate HOB 30 degrees, PPI  DVT prophylaxis: LMWH  Bowel regimen: N/A  Okay to transfer to PCU  Pulmonary/critical care service will sign off     The note was completed using EMR and Dragon dictation system. Every effort was made to ensure accuracy; however, inadvertent computerized transcription errors may be present.    Azzie Parchment, MD  Pulmonary and Critical Care Medicine  [facility]  [facility]  Pulmonary & Critical Care Medicine  [address] [place], [address], [place] [address]  Office: [phone]  fax: [phone]  [facility]  Pulmonary & Critical Care Medicine  [phone]  [address], [address], [place] [address]  Office: [phone]  fax: [phone]    "

## 2024-03-29 NOTE — Consults (Signed)
"  [facility] -  Clinical Pharmacy Note    Vancomycin  - Management by Pharmacy    Consult Date(s): 03/29/24  Consulting Provider(s): Dr. Fazal    Assessment / Plan   Sepsis, bacteremia - Vancomycin   Concurrent Antimicrobials: Ceftriaxone   Day of Vanc Therapy / Ordered Duration: 1 of 7 days  Current Dosing Method: Bayesian-Guided AUC Dosing  Therapeutic Goal: AUC 400-600 mg/L*hr  Current Dose / Plan:   Renal function: Scr 0.6, appears to be baseline  Will give 2000 mg IV loading dose x1 this morning.   Initiate vancomycin  1500 mg IV q12h. Therafter.  Kinetics predict an AUC = 588 mg/L*h with an estimated steady-state vancomycin  trough = 12.3 mcg/mL  No vancomycin  follow up levels have been ordered at this time. Will plan to check a level in 1-2 days.   Will continue to monitor clinical condition and make adjustments to regimen as appropriate.    Thank you for consulting pharmacy,    Elzie Kitty, PharmD, BCPS  Clinical Pharmacist  [phone]        Interval update:  therapy initiation . 1/2 blood cx with staph aureus as of this morning.     Subjective/Objective:   Shelly Richard is a 38 y.o. female with a PMHx significant for DM2, HLD, HTN who is admitted with DKA, elevated WBC with concern for UTI.     Pharmacy is consulted to dose vancomycin .    Ht Readings from Last 1 Encounters:   03/26/24 1.626 m (5' 4)     Wt Readings from Last 1 Encounters:   03/29/24 80.2 kg (176 lb 12.9 oz)     Current & Prior Antimicrobial Regimen(s):  Ceftriaxone  1000mg  IV q24h (12/14-current)  Vancomycin    2000 mg IV x1 loading dose (12/15)  1500 mg IV q12h (to start 12/15 PM)    Vancomycin  Level(s) / Doses:    Date Time Dose Type of Level / Level Interpretation                 Note: Serum levels collected for AUC-based dosing may be high if collected in close proximity to the dose administered. This is not necessarily indicative of toxicity.    Cultures & Sensitivities:    Date Site Micro Susceptibility / Result   12/13  COVID/Influenza Not detected    12/14 Urine  Sent    12/13 Blood x2 Staph aureus in 1/2 cultures mecA not detected     Recent Labs     03/28/24  0414 03/28/24  1520 03/28/24  2307 03/29/24  0225 03/29/24  0452   CREATININE  --  0.6 0.6 0.6  --    BUN  --  4* 4* 4*  --    WBC 16.4*  --   --  15.4* 15.8*       Estimated Creatinine Clearance: 130 mL/min (based on SCr of 0.6 mg/dL).    Additional Lab Values / Findings of Note:    No results for input(s): PROCAL in the last 72 hours.    "

## 2024-03-29 NOTE — Progress Notes (Signed)
"  Assessment completed as charted, see flow sheet. Respirations regular, unlabored. Remains tachycardic, continues to have intermittent left hip discomfort. Voiding without difficulty.  "

## 2024-03-29 NOTE — Consent (Signed)
"  Informed Consent for Blood Component Transfusion Note    I have discussed with the patient the rationale for blood component transfusion; its benefits in treating or preventing fatigue, organ damage, or death; and its risk which includes mild transfusion reactions, rare risk of blood borne infection, or more serious but rare reactions. I have discussed the alternatives to transfusion, including the risk and consequences of not receiving transfusion. The patient had an opportunity to ask questions and had agreed to proceed with transfusion of blood components.    Electronically signed by Enrigue Buckles, MD on 03/29/24 at 5:16 AM EST  "

## 2024-03-30 ENCOUNTER — Inpatient Hospital Stay: Admit: 2024-03-30 | Payer: Medicaid (Managed Care) | Primary: Oncology

## 2024-03-30 ENCOUNTER — Inpatient Hospital Stay: Admit: 2024-03-30 | Discharge: 2024-04-08 | Payer: Medicaid (Managed Care) | Primary: Oncology

## 2024-03-30 DIAGNOSIS — A4101 Sepsis due to Methicillin susceptible Staphylococcus aureus: Secondary | ICD-10-CM

## 2024-03-30 LAB — CBC WITH AUTO DIFFERENTIAL
Basophils %: 0.3 %
Basophils Absolute: 0.1 K/uL (ref 0.0–0.2)
Eosinophils %: 0.3 %
Eosinophils Absolute: 0.1 K/uL (ref 0.0–0.6)
Hematocrit: 20.7 % — CL (ref 36.0–48.0)
Hemoglobin: 6.6 g/dL — CL (ref 12.0–16.0)
Lymphocytes %: 9.6 %
Lymphocytes Absolute: 1.8 K/uL (ref 1.0–5.1)
MCH: 24.8 pg — ABNORMAL LOW (ref 26.0–34.0)
MCHC: 31.7 g/dL (ref 31.0–36.0)
MCV: 78.1 fL — ABNORMAL LOW (ref 80.0–100.0)
MPV: 8.4 fL (ref 5.0–10.5)
Monocytes %: 7 %
Monocytes Absolute: 1.3 K/uL (ref 0.0–1.3)
Neutrophils %: 82.8 %
Neutrophils Absolute: 15.7 K/uL — ABNORMAL HIGH (ref 1.7–7.7)
Platelets: 418 K/uL (ref 135–450)
RBC: 2.65 M/uL — ABNORMAL LOW (ref 4.00–5.20)
RDW: 16.6 % — ABNORMAL HIGH (ref 12.4–15.4)
WBC: 19 K/uL — ABNORMAL HIGH (ref 4.0–11.0)

## 2024-03-30 LAB — ECHO (TTE) COMPLETE (PRN CONTRAST/BUBBLE/STRAIN/3D)
AV Area by Peak Velocity: 1.8 cm2
AV Area by VTI: 2 cm2
AV Mean Gradient: 8 mmHg
AV Mean Velocity: 1.3 m/s
AV Peak Gradient: 13 mmHg
AV Peak Velocity: 1.8 m/s
AV VTI: 22.7 cm
AV Velocity Ratio: 0.61
AVA/BSA Peak Velocity: 1 cm2/m2
AVA/BSA VTI: 1.1 cm2/m2
Ao Root Index: 1.24 cm/m2
Aortic Root: 2.3 cm
Ascending Aorta Index: 1.24 cm/m2
Ascending Aorta: 2.3 cm
Body Surface Area: 1.89 m2
E/E' Lateral: 8.62
E/E' Ratio (Averaged): 8.97
E/E' Septal: 9.33
EF Physician: 65 %
Est. RA Pressure: 3 mmHg
Fractional Shortening 2D: 28 % (ref 28–44)
IVC Inspiration: 1.5 cm
IVC Proxmal: 2 cm
IVSd: 0.6 cm (ref 0.6–0.9)
LA Area 2C: 21.2 cm2
LA Area 4C: 19.4 cm2
LA Major Axis: 5 cm
LA Minor Axis: 4.9 cm
LA Volume BP: 65 mL — AB (ref 22–52)
LA Volume Index BP: 35 mL/m2 — AB (ref 16–34)
LA Volume Index MOD A2C: 38 mL/m2 — AB (ref 16–34)
LA Volume Index MOD A4C: 32 mL/m2 (ref 16–34)
LA Volume MOD A2C: 71 mL — AB (ref 22–52)
LA Volume MOD A4C: 59 mL — AB (ref 22–52)
LV E' Lateral Velocity: 13 cm/s
LV E' Septal Velocity: 12 cm/s
LV EDV 3D: 118 mL
LV EDV Index 3D: 64 mL/m2
LV ESV 3D: 50 mL
LV ESV Index 3D: 27 mL/m2
LV Mass 2D Index: 36.9 g/m2 — AB (ref 43–95)
LV Mass 2D: 68.2 g (ref 67–162)
LV Mass 3D: 141 g
LV Mass Index 3D: 76.2 g/m2
LV RWT Ratio: 0.36
LVIDd Index: 2.11 cm/m2
LVIDd: 3.9 cm (ref 3.9–5.3)
LVIDs Index: 1.51 cm/m2
LVIDs: 2.8 cm
LVOT Area: 2.8 cm2
LVOT Diameter: 1.9 cm
LVOT Mean Gradient: 3 mmHg
LVOT Peak Gradient: 5 mmHg
LVOT Peak Velocity: 1.1 m/s
LVOT SV: 45 mL
LVOT Stroke Volume Index: 24.4 mL/m2
LVOT VTI: 15.9 cm
LVOT:AV VTI Index: 0.7
LVPWd: 0.7 cm (ref 0.6–0.9)
MV A Velocity: 0.9 m/s
MV E Velocity: 1.12 m/s
MV E Wave Deceleration Time: 132 ms
MV E/A: 1.24
PV Max Velocity: 1.2 m/s
PV Peak Gradient: 6 mmHg
RA Area 4C: 8.2 cm2
RA Volume Index A4C: 9 mL/m2
RA Volume: 16 mL
RV Basal Dimension: 2.7 cm
RV Free Wall Peak S': 14.9 cm/s
RV Longitudinal Dimension: 6.6 cm
RV Mid Dimension: 2.4 cm
TAPSE: 2.2 cm (ref 1.7–?)

## 2024-03-30 LAB — ANA REFLEX TO ANTIBODY CASCADE: ANA: POSITIVE — AB

## 2024-03-30 LAB — ANA ANTIBODY CASCADE
Anti-Centromere B IgG: <0.2 AI (ref 0.0–0.9)
Anti-Chromatin IgG: <0.2 AI (ref 0.0–0.9)
Anti-JO1 IgG: <0.2 AI (ref 0.0–0.9)
Anti-RNP IgG: 0.3 AI (ref 0.0–0.9)
Anti-Ribosomal P IgG: <0.2 AI (ref 0.0–0.9)
Anti-SCL70 IgG: <0.2 AI (ref 0.0–0.9)
Anti-SS-A IgG: <0.2 AI (ref 0.0–0.9)
Anti-SS-B IgG: 4.1 AI — ABNORMAL HIGH (ref 0.0–0.9)
Anti-SmRNP IgG: <0.2 AI (ref 0.0–0.9)
Anti-Smith IgG: <0.2 AI (ref 0.0–0.9)
Anti-dsDNA IgG: 1 [IU]/mL (ref 0–9)

## 2024-03-30 LAB — CBC
Hematocrit: 18.4 % — CL (ref 36.0–48.0)
Hemoglobin: 5.7 g/dL — CL (ref 12.0–16.0)
MCH: 24.4 pg — ABNORMAL LOW (ref 26.0–34.0)
MCHC: 31.1 g/dL (ref 31.0–36.0)
MCV: 78.3 fL — ABNORMAL LOW (ref 80.0–100.0)
MPV: 8.7 fL (ref 5.0–10.5)
Platelets: 370 10*3/uL (ref 135–450)
RBC: 2.35 M/uL — ABNORMAL LOW (ref 4.00–5.20)
RDW: 16.7 % — ABNORMAL HIGH (ref 12.4–15.4)
WBC: 17.7 10*3/uL — ABNORMAL HIGH (ref 4.0–11.0)

## 2024-03-30 LAB — HEMOGLOBIN AND HEMATOCRIT
Hematocrit: 22.3 % — ABNORMAL LOW (ref 36.0–48.0)
Hemoglobin: 7 g/dL — ABNORMAL LOW (ref 12.0–16.0)

## 2024-03-30 LAB — CULTURE, BLOOD 1: Blood Culture, Routine: POSITIVE

## 2024-03-30 LAB — PREPARE RBC (CROSSMATCH): Dispense Status Blood Bank: TRANSFUSED

## 2024-03-30 LAB — FERRITIN: Ferritin: 861 ng/mL — ABNORMAL HIGH (ref 15.0–150.0)

## 2024-03-30 LAB — POCT GLUCOSE
POC Glucose: 262 mg/dL — ABNORMAL HIGH (ref 70–99)
POC Glucose: 290 mg/dL — ABNORMAL HIGH (ref 70–99)
POC Glucose: 244 mg/dL — ABNORMAL HIGH (ref 70–99)
POC Glucose: 272 mg/dL — ABNORMAL HIGH (ref 70–99)
POC Glucose: 359 mg/dL — ABNORMAL HIGH (ref 70–99)

## 2024-03-30 LAB — IRON AND TIBC
Iron % Saturation: 8 % — ABNORMAL LOW (ref 15–50)
Iron: 10 ug/dL — ABNORMAL LOW (ref 37–145)
TIBC: 125 ug/dL — ABNORMAL LOW (ref 260–445)

## 2024-03-30 LAB — CULTURE, BLOOD 2: Culture, Blood 2: POSITIVE

## 2024-03-30 LAB — CULTURE, URINE: Urine Culture, Routine: 25000

## 2024-03-30 MED ORDER — SODIUM CHLORIDE 0.9 % IV SOLN
0.9 | INTRAVENOUS | Status: DC | PRN
Start: 2024-03-30 — End: 2024-04-07

## 2024-03-30 MED FILL — INSULIN LISPRO 100 UNIT/ML IJ SOLN: 100 [IU]/mL | INTRAMUSCULAR | Qty: 4

## 2024-03-30 MED FILL — NAFCILLIN SODIUM 2 G IJ SOLR: 2 g | INTRAMUSCULAR | Qty: 2000

## 2024-03-30 MED FILL — INSULIN LISPRO 100 UNIT/ML IJ SOLN: 100 [IU]/mL | INTRAMUSCULAR | Qty: 5

## 2024-03-30 MED FILL — ENOXAPARIN SODIUM 40 MG/0.4ML IJ SOSY: 40 MG/0.4ML | INTRAMUSCULAR | Qty: 0.4

## 2024-03-30 MED FILL — ACETAMINOPHEN 325 MG PO TABS: 325 mg | ORAL | Qty: 2

## 2024-03-30 MED FILL — LEVOTHYROXINE SODIUM 50 MCG PO TABS: 50 ug | ORAL | Qty: 1

## 2024-03-30 MED FILL — LANTUS 100 UNIT/ML SC SOLN: 100 [IU]/mL | SUBCUTANEOUS | Qty: 30

## 2024-03-30 MED FILL — INSULIN LISPRO 100 UNIT/ML IJ SOLN: 100 [IU]/mL | INTRAMUSCULAR | Qty: 7

## 2024-03-30 MED FILL — PANTOPRAZOLE SODIUM 40 MG PO TBEC: 40 mg | ORAL | Qty: 1

## 2024-03-30 MED FILL — INSULIN LISPRO 100 UNIT/ML IJ SOLN: 100 [IU]/mL | INTRAMUSCULAR | Qty: 8

## 2024-03-30 NOTE — Progress Notes (Signed)
"  Time blood reached vein 1021    "

## 2024-03-30 NOTE — Procedures (Addendum)
"  PowerGlide Midline/Extended Dwell Catheter Procedure Note:  Chart reviewed for allergies, diagnosis, labs, known contraindications, reason for line placement and planned length of treatment.  Insertion procedure discussed with patient/family/staff member.  Informed consent not required for PowerGlide Midline/Extended Dwell placement.  Three patient identifiers - Patient name,  DOB and MRN -  completed &  confirmed verbally.   Hat, mask and eye shield donned.  Site scrubbed with Chloraprep  One-Step applicator for 30 seconds x 1.  Hand Hygiene performed prior to sterile gloves.  Patient draped. Vein located by Ultra Sound.  Midline/Extended Dwell Catheter inserted.  Positive brisk blood return obtained.  Valve applied to lumen and flushed with 10 mls  0.9% Sterile Sodium Chloride . Catheter flushes easily with no resistance.  Skin prep applied to site.  Catheter secured with non-sutured locking device per hospital protocol. Bio-patch/CHG impregnated sterile tegaderm dressing applied.  Alcohol Swab Cap applied to valve.  Sterile field maintained during procedure.   Guide wire accounted for post procedure. Pt. Response to procedure, tolerated well. Appearance of site: Clean dry and intact without bleeding or edema. All edges of Tegaderm occlusive.   Site marked with date and initials of RN placing line.Top 2 side rails in up position, call button in reach, RN notified of all of the above.   "

## 2024-03-30 NOTE — Plan of Care (Signed)
"      Consult received, will see patient late afternoon  Please obtain plain xrays of left hip if not already done      LAMAR GRETTA BIDDING, MD  [facility]  [phone] (cell)  "

## 2024-03-30 NOTE — Progress Notes (Signed)
"  Spiritual Health Progress Note  [facility]       Room # 4516/4516-01    Name: Shelly Richard           Age: 38 y.o.    Gender: female          MRN: [id number]  Religion: None       Preferred Language: English      Date: 03/30/24  Visit Time: Begin Time: (P) 1450 End Time : (P) 1459  Complexity of Encounter: (P) Low      Visit Summary: Chaplain met with Shelly Richard with husband at bedside. She expressed gratitude and politely declined further spiritual care interventions at this time.     Referral/Consult From: Rounding  Encounter Overview/Reason: Spiritual/Emotional Needs  Encounter Code:     Crisis (if applicable):    Service Provided For: Patient and family together     Patient was not available. Patient declined visit but aware chaplain can be notified as needed/desired    Faith, Belief, Meaning:   Patient has beliefs or practices that help with coping during difficult times  Family/Friends have beliefs or practices that help with coping during difficult times  Rituals (if applicable)      Importance and Influence:  Patient does not have spiritual/personal beliefs that influence decisions regarding their health  Family/Friends does not have spiritual/personal beliefs that influence decisions regarding the patient's health    Community:  Patient   indicated that they feel well-supported  Support System Includes   (P) Spouse   Family/Friends   indicated that they feel well-supported  Support System Includes   (P) Spouse     Assessment and Plan of Care:   Emotions Expressed by Patient:   Assessment: (P) Calm, Coping    Interventions by Chaplain:   Intervention: (P) Active listening, Explored/Affirmed feelings, thoughts, concerns     Result/ Response by Patient:   Outcome: (P) Expressed Gratitude, Refused/Declined    Patient Plan of Care:   Plan and Referrals  Plan/Referrals: (P) No future visits requested     Emotions Expressed by Spouse/Family/Friends:   calm    Chaplain Interventions with Spouse/  Family/Friends include:   provided ministry of presence    Spouse/Family/Friends Plan of Care:   No spiritual needs identified for follow-up.  Spiritual care available upon referral.      Electronically signed by   Elia Hassan Lacer, BCC on 03/30/2024 at 2:59 PM  Spiritual Health  [facility]   [phone] or PerfectServe  Page via the operator in emergencies    Chaplain on-site M-F [phone]. For support outside of those hours, ask the hospital operator to page the on-call chaplain. On-call chaplain support available 24/7.     "

## 2024-03-30 NOTE — Care Coordination (Signed)
 "Case Management Assessment  Initial Evaluation    Date/Time of Evaluation: 03/30/2024 5:01 PM  Assessment Completed by: Dorothyann Hamilton    If patient is discharged prior to next notation, then this note serves as note for discharge by case management.    Patient Name: Shelly Richard                   Date of Birth: 1986-04-04  Diagnosis: DKA, type 2, not at goal [facility]) [E11.10]  Diabetic ketoacidosis without coma associated with type 2 diabetes mellitus (HCC) [E11.10]  Acute nonintractable headache, unspecified headache type [R51.9]                   Date / Time: 03/26/2024 11:05 PM    Patient Admission Status: Inpatient   Readmission Risk (Low < 19, Mod (19-27), High > 27): Readmission Risk Score: 12.3    Current PCP: Shelly Bernice FORBES, [name]  PCP verified by CM? Yes    Chart Reviewed: Yes      History Provided by: Patient, Medical Record  Patient Orientation: Alert    Patient Cognition: No Apparent Deficit    Hospitalization in the last 30 days (Readmission):  No    If yes, Readmission Assessment in CM Navigator will be completed.    Advance Directives:      Code Status: Full Code   Patient's Primary Decision Maker is: Legal Next of Kin      Discharge Planning:    Patient lives with: Significant other Type of Home: House  Primary Care Giver: Self  Patient Support Systems include: Family Members, Spouse/Significant Other   Current community resources: None  Current services prior to admission: None            Current DME:              Type of Home Care services:       ADLS  Prior functional level: Independent in ADLs/IADLs  Current functional level: Independent in ADLs/IADLs    PT AM-PAC:   /24  OT AM-PAC:   /24    Family can provide assistance at DC: Yes  Would you like Case Management to discuss the discharge plan with any other family members/significant others, and if so, who?    Plans to Return to Present Housing: Yes  Other Identified Issues/Barriers to RETURNING to current housing: none  Potential  Assistance needed at discharge: N/A            Potential DME:    Patient expects to discharge to: Home  Plan for transportation at discharge:      Financial    Payor: /     Does insurance require precert for SNF: No - no insurance    Potential assistance Obtaining Medications:    Meds-to-Beds request:        [id number] - [place], [place] - [name] [phone] - F [phone]  [address]  [place] [address]  Phone: [phone] Fax: [phone]      Notes:    Factors facilitating achievement of predicted outcomes: Family support, Motivated, Cooperative, and Pleasant    Barriers to discharge: Medical complications    Additional Case Management Notes:     CM following for DCP- pt from home with S/O, IPTA, functional mobility remains at/near baseline per documentation.     D/w ID- pt likely needing about 4 weeks of IV abx at DC, final recs pending repeat blood cultures and  TTE.     Pt is uninsured and does not qualify for Medicaid, per Public Benefits assessment on 12/15. CM attempting to find Home Infusion & HHC agencies able to take pt as charity/self-pay. If unable to find Home agencies able to accept, will request outpt infusion review for possible set, if appropriate.     CM will follow and continue to assist with DCP.       The Plan for Transition of Care is related to the following treatment goals of DKA, type 2, not at goal [facility]) [E11.10]  Diabetic ketoacidosis without coma associated with type 2 diabetes mellitus (HCC) [E11.10]  Acute nonintractable headache, unspecified headache type [R51.9]    IF APPLICABLE: The Patient and/or patient representative [name] and her family were provided with a choice of provider and agrees with the discharge plan. Freedom of choice list with basic dialogue that supports the patient's individualized plan of care/goals and shares the quality data associated with the providers was provided to: Patient   Patient Representative Name:        The Patient and/or Patient Representative Agree with the Discharge Plan? Yes    Thank you  Cat Glendia RN, BSN, CM  ICU Case Manager  [phone]      "

## 2024-03-30 NOTE — Plan of Care (Signed)
"    Problem: Chronic Conditions and Co-morbidities  Goal: Patient's chronic conditions and co-morbidity symptoms are monitored and maintained or improved  Outcome: Progressing     Problem: Discharge Planning  Goal: Discharge to home or other facility with appropriate resources  Outcome: Progressing     Problem: Safety - Adult  Goal: Free from fall injury  Outcome: Progressing     Problem: Pain  Goal: Verbalizes/displays adequate comfort level or baseline comfort level  Outcome: Progressing     Problem: Nutrition Deficit:  Goal: Optimize nutritional status  Outcome: Progressing     Problem: ABCDS Injury Assessment  Goal: Absence of physical injury  Outcome: Progressing     "

## 2024-03-30 NOTE — Plan of Care (Signed)
"    Problem: Chronic Conditions and Co-morbidities  Goal: Patient's chronic conditions and co-morbidity symptoms are monitored and maintained or improved  Outcome: Progressing     Problem: Discharge Planning  Goal: Discharge to home or other facility with appropriate resources  Outcome: Progressing     Problem: Safety - Adult  Goal: Free from fall injury  Outcome: Progressing  Flowsheets (Taken 03/29/2024 2000)  Free From Fall Injury:   Instruct family/caregiver on patient safety   Based on caregiver fall risk screen, instruct family/caregiver to ask for assistance with transferring infant if caregiver noted to have fall risk factors     Problem: Pain  Goal: Verbalizes/displays adequate comfort level or baseline comfort level  Outcome: Progressing     Problem: Nutrition Deficit:  Goal: Optimize nutritional status  Outcome: Progressing     Problem: ABCDS Injury Assessment  Goal: Absence of physical injury  Outcome: Progressing  Flowsheets (Taken 03/29/2024 2000)  Absence of Physical Injury: Implement safety measures based on patient assessment     "

## 2024-03-30 NOTE — Consults (Signed)
 "  Attending   Consult Note        Reason for Consult:  Left hip pain  Requesting Physician: Arne Marne LABOR, MD  Date of Service: 03/30/2024 4:01 PM    CHIEF COMPLAINT:  As Above    History Obtained From:  patient, family member, electronic medical record    HISTORY OF PRESENT ILLNESS:                The patient is a 38 y.o. female who presents with above chief complaint.  Admitted recently due to DKA with suspected UTI and bacteremia. Reports left hip pain. Family member states prior to admission patient was sitting cross-legged for long periods of time in bed over 2 weeks. No history of prior reported hip injury or instrumentation.    Past Medical History:        Diagnosis Date    Acute headache     Allergies     Asthma     Diabetes mellitus (HCC)     Genital herpes     HTN (hypertension)     Hyperlipidemia     Hypothyroid     Urticaria      Past Surgical History:        Procedure Laterality Date    CYST REMOVAL      HYSTEROSCOPY      x2    MYOMECTOMY N/A 02/01/2022    ROBOTIC MYOMECTOMY performed by Foley, Chauncey Sharper, MD at [facility] OR         Medications Prior to Admission:   Prior to Admission medications   Medication Sig Start Date End Date Taking? Authorizing Provider   Blood Glucose Monitoring Suppl (ACURA BLOOD GLUCOSE METER) w/Device KIT Please provide meter approved by insurance.  Check bs qam 02/16/18  Yes Tussey, Rocio G, MD   insulin  detemir (LEVEMIR  FLEXPEN) 100 UNIT/ML injection pen INJECT 20 UNITS UNDER THE SKIN ONCE NIGHTLY  Patient not taking: Reported on 03/26/2024 12/02/23   Alexandro Bernice BRAVO, [name]   Continuous Glucose Sensor (FREESTYLE LIBRE 2 SENSOR) MISC APPLY SENSOR TO SKIN FOR CONTINUOUS BLOOD SUGAR MONITORING, REPLACE EVERY 14 DAYS  Patient not taking: Reported on 03/27/2024 12/02/23   Alexandro Bernice BRAVO, [name]   insulin  lispro, 1 Unit Dial , (HUMALOG /ADMELOG ) 100 UNIT/ML SOPN INJECT 10 UNITS UNDER THE SKIN WITH MEALS PLUS INJECT 2 UNITS FOR BLOOD SUGAR  150-200, 4 UNITS IF  201-250, 6 UNITS IF 251-300, 8 UNITS IF 301-350, 10 UNITS IF GREATER THAN 350  Patient not taking: Reported on 03/26/2024 12/02/23   Alexandro Bernice BRAVO, [name]   Semaglutide , 1 MG/DOSE, (OZEMPIC , 1 MG/DOSE,) 4 MG/3ML SOPN sc injection Inject 1 mg into the skin every 7 days  Patient not taking: Reported on 03/26/2024 12/02/23   Alexandro Bernice BRAVO, [name]   Cholecalciferol (VITAMIN D3) 50 MCG (2000 UT) TABS TAKE ONE TABLET BY MOUTH DAILY  Patient not taking: Reported on 03/27/2024 11/26/23   Alexandro Bernice BRAVO, [name]   ferrous sulfate  (FEROSUL) 325 (65 Fe) MG tablet Take 1 tablet by mouth daily (with breakfast)  Patient not taking: Reported on 03/27/2024 11/26/23   Alexandro Bernice BRAVO, [name]   metFORMIN  (GLUCOPHAGE -XR) 500 MG extended release tablet Take 4 tablets by mouth daily (with breakfast)  Patient not taking: Reported on 03/26/2024 11/25/23   Alexandro Bernice BRAVO, [name]   insulin  glargine (LANTUS  SOLOSTAR) 100 UNIT/ML injection pen Inject 40 Units into the  skin nightly  Patient not taking: Reported on 03/26/2024 04/29/23   Osterbrock, Erika E, [name]   Insulin  Pen Needle (DROPLET PEN NEEDLES) 32G X 6 MM MISC USE ONCE DAILY  Patient not taking: Reported on 03/27/2024 07/09/22   Alexandro Bernice BRAVO, [name]   dapagliflozin  (FARXIGA ) 10 MG tablet Take 1 tablet by mouth every morning  Patient not taking: Reported on 03/26/2024 05/22/22   Osterbrock, Erika E, [name]   simvastatin  (ZOCOR ) 40 MG tablet Take 1 tablet by mouth daily  Patient not taking: Reported on 03/26/2024 05/22/22   Alexandro Bernice BRAVO, [name]   cetirizine  (ZYRTEC ) 10 MG tablet Take 1 tablet by mouth daily  Patient not taking: Reported on 03/26/2024 05/22/22   Alexandro Bernice BRAVO, [name]   levothyroxine  (SYNTHROID ) 75 MCG tablet TAKE ONE TABLET BY MOUTH DAILY  Patient not taking: Reported on 03/27/2024 05/22/22   Alexandro Bernice BRAVO, [name]   montelukast  (SINGULAIR ) 10 MG tablet Take 1 tablet by mouth daily  Patient  not taking: Reported on 03/26/2024 05/22/22   Alexandro Bernice BRAVO, [name]   furosemide  (LASIX ) 20 MG tablet Take 1 tablet by mouth daily as needed (swelling)  Patient not taking: Reported on 03/27/2024 05/22/22   Osterbrock, Erika E, [name]   ibuprofen  (ADVIL ;MOTRIN ) 600 MG tablet Take 1 tablet by mouth every 6 hours as needed for Pain 02/01/22 11/25/23  Foley, Chauncey Sharper, MD   acetaminophen  (TYLENOL ) 500 MG tablet Take 1 tablet by mouth 4 times daily as needed for Pain 02/01/22 11/25/23  Foley, Chauncey Sharper, MD   Continuous Blood Gluc Sensor (FREESTYLE LIBRE 2 SENSOR) MISC USE TO TEST BLOOD GLUCOSE AS DIRECTED  Patient not taking: Reported on 03/27/2024 12/25/21   Alexandro Bernice BRAVO, [name]   letrozole (FEMARA) 2.5 MG tablet  06/06/21   [provider]   losartan  (COZAAR ) 25 MG tablet TAKE ONE TABLET BY MOUTH DAILY  Patient not taking: Reported on 03/26/2024 07/05/21   Osterbrock, Erika E, [name]   Insulin  Pen Needle 32G X 6 MM MISC Use four times daily and as needed  Patient not taking: Reported on 03/27/2024 06/12/21   Osterbrock, Erika E, [name]   EPINEPHrine (EPIPEN) 0.3 MG/0.3ML SOAJ injection  04/05/21   [provider]   blood glucose monitor strips Tes2t qam  times a day & as needed for symptoms of irregular blood glucose. Please provide test strips approved by insurance.  Patient not taking: Reported on 03/27/2024 12/29/19   Alexandro Bernice BRAVO, [name]   albuterol  sulfate HFA 108 (90 Base) MCG/ACT inhaler Inhale 2 puffs into the lungs every 6 hours as needed for Wheezing or Shortness of Breath 04/26/19   Alexandro Bernice BRAVO, [name]   Insulin  Pen Needle ([employer] PEN NEEDLES 31G) 31G X 8 MM MISC 1 each by Does not apply route daily  Patient not taking: Reported on 03/27/2024 02/10/19   Alexandro Bernice BRAVO, [name]   valACYclovir  (VALTREX ) 500 MG tablet TAKE 1 TABLET BY MOUTH DAILY  Patient not taking: Reported on 11/15/2023 03/03/18   Seena JAYSON Eck, MD   Lancets MISC 1  each by Does not apply route daily Please provide lancets approved by insurance.  Patient not taking: Reported on 03/27/2024 02/16/18   Tussey, Rocio G, MD       Allergies:  Ace inhibitors, Lisinopril, Peanuts [peanut oil], and Walnut  Social History:    Tobacco:  reports that she has never smoked. She has never used smokeless tobacco.   Alcohol:  reports no history of alcohol use.   Illicit Drug:    Family History:       Problem Relation Age of Onset    Diabetes Mother     Diabetes Father        REVIEW OF SYSTEMS:   All additional Review of Systems were reviewed and noted to be negative or noncontributory today.   CONSTITUTIONAL:  negative  MUSCULOSKELETAL:  positive for  pain    PHYSICAL EXAM:    awake, alert, cooperative, no apparent distress, and appears stated age  MUSCULOSKELETAL:  there is no redness, warmth, or swelling of the joints  full range of motion noted  motor strength is 5 out of 5 all extremities bilaterally  tone is normal  with exception of  LEFT HIP:  redness absent  warmth absent  swelling absent  tenderness present diffusely along buttocks, greater trochanter, anterior thigh  range of motion - satisfactory leg ext/flex, IR/ER, Abd/Add with modest discomfort  Ambulates with walker Full WB    DATA:    CBC:   Recent Labs     03/29/24  0452 03/30/24  0414 03/30/24  0610 03/30/24  1351   WBC 15.8* 19.0* 17.7*  --    HGB 7.3* 6.6* 5.7* 7.0*   PLT 411 418 370  --      BMP:    Recent Labs     03/28/24  2307 03/29/24  0225 03/29/24  1448   NA 136 132* 133*  132*   K 3.5 3.6 4.2  4.0   CL 102 101 100  100   CO2 23 21 18*  20*   BUN 4* 4* 5*  5*   CREATININE 0.6 0.6 0.6  0.6   GLUCOSE 144* 295* 255*  257*     INR: No results for input(s): INR in the last 72 hours.    Radiology:   XR HIP LEFT (2-3 VIEWS)   Final Result   1. No acute fracture or dislocation.   2. Mild degenerative changes of the hips.      Electronically signed by Rosalva Smaller      CT ABDOMEN PELVIS WO CONTRAST Additional  Contrast? None   Final Result      1. Diverticulosis of the colon without evidence of diverticulitis.   2. Focal areas of striated enhancement of the kidneys, previous CT contrast administration. Findings could indicate renal dysfunction or infection.   3. Mild periarticular edema or swelling of the left hip suggesting muscular injury/strain.   4. Hepatosplenomegaly.            This dictation was created with voice recognition software.  While attempts have been made to review the dictation as it was transcribed, occasionally the spoken word can be misinterpreted  by the technology, leading to omissions or inappropriate words    or phrases.  If questions or concerns arise, please contact the Radiology Department.      Electronically signed by Elouise Hugh, MD      CT SOFT TISSUE NECK WO CONTRAST   Final Result      1. Negative.            This dictation was created with voice recognition software.  While attempts have been made to review the dictation as it was transcribed, occasionally the spoken word can be misinterpreted  by the  technology, leading to omissions or inappropriate words    or phrases.  If questions or concerns arise, please contact the Radiology Department.      Electronically signed by Elouise Hugh, MD      CT HEAD WO CONTRAST   Final Result   *  No acute intracranial findings.      Electronically signed by Isom Repress      CT CHEST PULMONARY EMBOLISM W CONTRAST   Final Result      1. No evidence of pulmonary embolus.            This dictation was created with voice recognition software.  While attempts have been made to review the dictation as it was transcribed, occasionally the spoken word can be misinterpreted  by the technology, leading to omissions or inappropriate words    or phrases.  If questions or concerns arise, please contact the Radiology Department.      Electronically signed by Elouise Hugh, MD      XR CHEST PORTABLE   Final Result      Clear lungs.      Normal cardiac  mediastinal silhouette.      Electronically signed by Lynwood Schwalbe, MD             IMPRESSION/RECOMMENDATIONS:    Assessment: Left hip pain most consistent with soft tissue strain    Plan:  1) Discussed with patient and family that CT and xray findings are not classic for obvious infectious etiology, although that cannot be completely excluded. Currently would favor soft tissue muscular strain based on CT findings and exam and history    2) Certainly if symptoms worsen could further image with MRI and even consider CT-guided aspiration, although not necessarily indicated at this point    3) May participate in PT as appropriate and full weight bearing    4) Can re-evaluate and consider imaging as above if clinical picture worsens          Thank you for the opportunity to consult on this patient.    LAMAR GRETTA BIDDING, MD  [phone] (cell)  [phone] (appointment scheduling)      It should be noted that a total time spent with >50% of the time coordinating patient care and decision making was 70 minutes.    "

## 2024-03-30 NOTE — Progress Notes (Signed)
"  Morning labs showed 6.6 hemoglobin, order for redraw per hospitalist .   "

## 2024-03-30 NOTE — Progress Notes (Signed)
 "    V2.0    US[facility] Progress Note      Name:  Shelly Richard DOB/Age/Sex: 08/02/1985  (38 y.o. female)   MRN & CSN:  [id number] & [id number] Encounter Date/Time: 03/30/2024 12:21 PM EST   Location:  4516/4516-01 PCP: Alexandro Bernice FORBES, [name]     Attending:Gunhild Bautch, Marne LABOR, MD       Hospital Day: 5  Subjective:     Chief Complaint: Sepsis    Shelly Richard is a 39 y.o. female who presents with sepsis    Subjective:    Patient feels okay.  Complains of some hip pain  Poor appetite spiking a fever      Assessment and Recommendations   HPI per admitting provider :      Hospital course:    Shelly Richard is a 38 y.o. female with pmh of diabetes, hypertension, hyperlipidemia who presents with DKA, type 2, not at goal [facility]) with sepsis.  Patient has not been taking her medications due to insurance issues.  DKA was treated with an insulin  drip and now switched to subcutaneous insulin .  Workup revealed MSSA bacteremia      Plan:     DKA resolved    Uncontrolled type 2 diabetes  - On Lantus  with Premeal insulin , if sugars are high will increase dose of insulin  tomorrow  - On sliding scale insulin   - As patient is on insulin  monitor blood sugars hypoglycemia    Sepsis-white count still elevated, still spiking fever off-and-on  - Present on admission  - Still spiking fever    MSSA bacteremia  - Was initially on vancomycin  switched to nafcillin  on 12/15  - Repeat blood culture pending  - TEE pending      Acute on chronic anemia  -Hemoglobin dropped from 10.9 to 5.7  -Iron  studies shows iron  deficiency but with bacteremia will hold off IV iron   - In the past patient needed iron  transfusions    Hypothyroidism  -Resume low-dose Levothyroid    Hip pain  - CT shows some fluid collection  - Will consult Ortho      ? femara        I spent _   minutes in the care of this patient.  Over 50% of that time was in face-to-face counseling regarding disease process, diagnostic testing, preventative measures, and answering patient  and family questions.     Diet ADULT DIET; Regular; 3 carb choices (45 gm/meal)   DVT Prophylaxis []  Lovenox , []   Heparin, []  SCDs, []  Ambulation,  []  Eliquis, []  Xarelto  []  Coumadin   Code Status Full Code   Disposition   Unclear     Surrogate Decision Maker/ POA Spouse       MDM  [x]  High (any 2 of A, B, or C)    A. Problems (any 1)  [x]  Acute/Chronic Illness/injury posing threat to life or bodily function:    []  Severe exacerbation of chronic illness:    ---------------------------------------------------------------------  B. Risk of Treatment (any 1)   []  IV ABX requiring serial renal monitoring for nephrotoxicity:     []  IV Narcotic analgesia for adverse drug reaction  []  IV diuresis requiring serial monitoring for renal impairment and electrolyte derangements  []  Critical electrolyte abnormalities requiring IV replacement and close serial monitoring  [x]  Insulin  - monitoring serial FSBS for Hypoglycemic adverse drug reaction  []  Anticoagulation requiring serial monitoring of coagulation factors  []  IV/IM Controlled Substances order (any 1)   []   One-time Order including Drug Name/Route, Reason Ordered:   []  Scheduled Order including Drug Name/Route, Reason Ordered or Continued:   []  PRN Order including Drug Name/Route, Reason Ordered or Continued:  [] Other -   []  Decision to De-escalate Care this DOS due to change in treatment goals:  []  Decision to Escalate Care To:   []  Major Surgery/Procedure (any 1):   Elective with patient risk factors including Procedure Type and Risk Factors:   Emergent Procedure Type:    ----------------------------------------------------------------------  C. Data (any 2)  []  Data Review (3+ points)  []  Consultant Note reviewed with note date, specialty, and summary (1 point each)  [x]  All current labs were reviewed and interpreted for clinical significance   []  Studies Reviewed (1 point each):   []  Collateral history obtained including from who and why needed (Max 1 point):  []   Independent (Denali Becvar DELENA Creighton, MD) Interpretation of tests (any 1)  []  Rhythm Strip (Telemetry) personally reviewed and interpreted as documented above    []  Imaging personally reviewed and interpreted, includes:    [x]  Discussion (any 1)  [x]  Discussed the discharge plan in detail with case management including timing/barriers to discharge, need for support services and/or placement decision   []  Discussed management of the case with:         Review of Systems:      Pertinent positives and negatives discussed in HPI    Objective:     Intake/Output Summary (Last 24 hours) at 03/30/2024 1221  Last data filed at 03/30/2024 0940  Gross per 24 hour   Intake 3586.32 ml   Output 3650 ml   Net -63.68 ml      Vitals:   Vitals:    03/30/24 0857 03/30/24 1012 03/30/24 1017 03/30/24 1041   BP: 108/64 127/68 108/64    Pulse: (!) 122 (!) 123     Resp: 19 14     Temp: 99.7 F (37.6 C) 98.5 F (36.9 C)  99.7 F (37.6 C)   TempSrc: Oral Oral  Oral   SpO2: 97% 98%     Weight:   79.4 kg (175 lb)    Height:   1.626 m (5' 4)          Physical Exam:      General: NAD  Eyes: EOMI  ENT: neck supple  Cardiovascular: Regular rate.  Respiratory: Clear to auscultation  Gastrointestinal: Soft, non tender  Genitourinary: no suprapubic tenderness  Musculoskeletal: No edema  Skin: warm, dry  Neuro: Alert.  Psych: Mood appropriate.         Medications:   Medications:    insulin  glargine  30 Units SubCUTAneous Nightly    insulin  lispro  0-8 Units SubCUTAneous 4x Daily AC & HS    pantoprazole   40 mg Oral QAM AC    nafcillin   2,000 mg IntraVENous Q4H    levothyroxine   25 mcg Oral Daily    insulin  lispro  7 Units SubCUTAneous TID WC    sodium chloride  flush  5-40 mL IntraVENous 2 times per day    [Held by provider] enoxaparin   40 mg SubCUTAneous Daily      Infusions:    sodium chloride       lactated ringers  Stopped (03/30/24 0532)    sodium chloride       dextrose  5 % and 0.45 % NaCl Stopped (03/28/24 2303)    dextrose        PRN Meds: sodium  chloride, , PRN  dextrose  bolus, 125 mL, PRN  Or  dextrose  bolus, 250 mL, PRN  magnesium  sulfate, 2,000 mg, PRN  sodium phosphate  15 mmol in sodium chloride  0.9 % 250 mL IVPB, 15 mmol, PRN  sodium chloride  flush, 5-40 mL, PRN  sodium chloride , , PRN  magnesium  sulfate, 2,000 mg, PRN  ondansetron , 4 mg, Q8H PRN   Or  ondansetron , 4 mg, Q6H PRN  polyethylene glycol, 17 g, Daily PRN  acetaminophen , 650 mg, Q6H PRN   Or  acetaminophen , 650 mg, Q6H PRN  dextrose  5 % and 0.45 % NaCl, , Continuous PRN  glucose, 4 tablet, PRN  glucagon , 1 mg, PRN  dextrose , , Continuous PRN  albuterol , 2.5 mg, Q6H PRN  HYDROmorphone , 0.25 mg, Q3H PRN   Or  HYDROmorphone , 0.5 mg, Q3H PRN        Labs and Imaging   CT SOFT TISSUE NECK WO CONTRAST  Result Date: 03/28/2024  DATE: March 28, 2024 EXAM: CT SOFT TISSUE NECK WO CONTRAST INDICATION: neck pain, fever; COMPARISON: None. TECHNIQUE: Axial CT imaging obtained from the skull base through the lung apices. Axial images and multiplanar reformatted images reviewed.   Up-to-date CT equipment and radiation dose reduction techniques were employed. IV Contrast: None FINDINGS: SCOUT: Nondiagnostic scout image(s) obtained for localization purposes, not for diagnostic use DEVICES/HARDWARE: None . ORBITS, PARANASAL SINUSES, SKULL BASE: Limited images of the base of the brain demonstrate no mass effect. Orbits are normal. Imaged sinuses are clear. CERVICAL SPINE/BONES: No destructive process. NASAL CAVITY/NASOPHARYNX: No mass. ORAL CAVITY/OROPHARYNX: No evidence of mass. HYPOPHARYNX AND LARYNX: Normal. DEEP NECK SPACES: The parapharyngeal, retropharyngeal and masticator spaces are normal. SALIVARY GLANDS: Submandibular glands, sublingual glands and parotid glands are normal. THYROID GLAND: Normal. LYMPH NODES: Nonenlarged cervical lymph nodes bilaterally. THORACIC INLET: No mass or acute abnormality involving the visualized mediastinal structures, trachea or lung apices. OTHER: No significant  abnormality.     1. Negative. This dictation was created with voice recognition software.  While attempts have been made to review the dictation as it was transcribed, occasionally the spoken word can be misinterpreted  by the technology, leading to omissions or inappropriate words or phrases.  If questions or concerns arise, please contact the Radiology Department. Electronically signed by Elouise Hugh, MD    CT ABDOMEN PELVIS WO CONTRAST Additional Contrast? None  Result Date: 03/28/2024  DATE: 03/28/2024 EXAM: CT ABDOMEN PELVIS WO CONTRAST INDICATION: hip pain Pain COMPARISON: 2015 TECHNIQUE: Axial CT imaging obtained from lung bases through pelvis. Axial images and multiplanar reformatted images are provided for review.  Up-to-date CT equipment and radiation dose reduction techniques were employed. IV Contrast: None Oral Contrast: No. FINDINGS: SCOUT: Nondiagnostic scout image(s) obtained for localization purposes, not for diagnostic use DEVICES/HARDWARE: None . LUNG BASES: Clear. LIVER: There is decreased in attenuation. Liver is up to 25 cm in craniocaudal dimension. GALLBLADDER AND BILIARY TREE: No calcified gallstones. No gallbladder distention.  No intra- or extrahepatic biliary dilatation. PANCREAS: Normal. SPLEEN: Spleen is up to 14 cm in craniocaudal dimension. ADRENAL GLANDS: Normal. KIDNEYS AND URETERS: No hydronephrosis. Focal areas of striated enhancement of the kidneys bilaterally. Mild perinephric haziness bilaterally without fluid collection. BOWEL: Normal diameter, nonobstructed.  Appendix is not dilated or thickened. Distal colonic diverticulosis. URINARY BLADDER: Normal. REPRODUCTIVE ORGANS: No associated masses. LYMPH NODES: No abnormally enlarged nodes. PERITONEUM/RETROPERITONEUM: No ascites or free air. VESSELS: Aorta and IVC without significant abnormality.  ABDOMINAL WALL: Mild periarticular edema or swelling of the left hip. BONES: No significant abnormality. OTHER FINDINGS: None.  1. Diverticulosis of the colon without evidence of diverticulitis. 2. Focal areas of striated enhancement of the kidneys, previous CT contrast administration. Findings could indicate renal dysfunction or infection. 3. Mild periarticular edema or swelling of the left hip suggesting muscular injury/strain. 4. Hepatosplenomegaly. This dictation was created with voice recognition software.  While attempts have been made to review the dictation as it was transcribed, occasionally the spoken word can be misinterpreted  by the technology, leading to omissions or inappropriate words or phrases.  If questions or concerns arise, please contact the Radiology Department. Electronically signed by Elouise Hugh, MD    CT HEAD WO CONTRAST  Result Date: 03/28/2024  EXAM: CT HEAD WO CONTRAST STUDY DATE: 03/28/2024 11:15 EST INDICATION: neck pain COMPARISON: None TECHNIQUE: Standard per department protocol. Up-to-date CT equipment and radiation dose reduction techniques were employed. CONTRAST: No intravenous FINDINGS: Brain Parenchyma: No midline shift, mass effect, parenchymal hemorrhage, or evidence of acute territorial infarct. Ventricular System and Extra-Axial Spaces: No extra-axial fluid collections. Basilar cisterns are patent. No hydrocephalus. Extracranial Structures: No significant paranasal sinus disease. No calvarial abnormality. No orbit abnormality.     *  No acute intracranial findings. Electronically signed by Isom Repress    CT CHEST PULMONARY EMBOLISM W CONTRAST  Result Date: 03/27/2024  DATE: 03/27/2024 EXAM: CT CHEST PULMONARY EMBOLISM W CONTRAST INDICATION: c/f PE, Chest pain COMPARISON: None. TECHNIQUE: Axial CT imaging obtained through the chest using CT pulmonary angiogram protocol. Axial images, multiplanar reformatted images and maximum intensity projection images were reviewed.  Up-to-date CT equipment and radiation dose reduction techniques were employed.  IV Contrast Media and volume: 80 cc  iodinated contrast FINDINGS: SCOUT: Nondiagnostic scout image(s) obtained for localization purposes, not for diagnostic use DEVICES/HARDWARE: None . DIAGNOSTIC QUALITY: Suboptimal. PULMONARY EMBOLI: None. RIGHT HEART STRAIN: None. LUNGS AND AIRWAYS: Airways are patent.  Lungs are clear. PLEURA: No pleural effusions or significant pleural thickening. HEART / GREAT VESSELS: Heart is not enlarged. ADENOPATHY: None. CHEST WALL / LOWER NECK: No significant abnormality. UPPER ABDOMEN: Normal. BONES: No significant abnormality. OTHER FINDINGS: None.     1. No evidence of pulmonary embolus. This dictation was created with voice recognition software.  While attempts have been made to review the dictation as it was transcribed, occasionally the spoken word can be misinterpreted  by the technology, leading to omissions or inappropriate words or phrases.  If questions or concerns arise, please contact the Radiology Department. Electronically signed by Elouise Hugh, MD    XR CHEST PORTABLE  Result Date: 03/27/2024  Chest HISTORY : Dyspnea VIEWS : 1     Clear lungs. Normal cardiac mediastinal silhouette. Electronically signed by Lynwood Schwalbe, MD      CBC:   Recent Labs     03/29/24  0452 03/30/24  0414 03/30/24  0610   WBC 15.8* 19.0* 17.7*   HGB 7.3* 6.6* 5.7*   PLT 411 418 370     BMP:    Recent Labs     03/28/24  2307 03/29/24  0225 03/29/24  1448   NA 136 132* 133*  132*   K 3.5 3.6 4.2  4.0   CL 102 101 100  100   CO2 23 21 18*  20*   BUN 4* 4* 5*  5*   CREATININE 0.6 0.6 0.6  0.6   GLUCOSE 144* 295* 255*  257*     Hepatic: No results for input(s): AST, ALT, BILITOT, ALKPHOS in the last 72 hours.    Invalid input(s):  ALB  Lipids:   Lab Results   Component Value Date/Time    CHOL 154 11/25/2023 02:44 PM    HDL 22 11/25/2023 02:44 PM    TRIG 170 11/25/2023 02:44 PM     Hemoglobin A1C:   Lab Results   Component Value Date/Time    LABA1C 13.9 03/27/2024 03:47 AM     TSH:   Lab Results   Component Value  Date/Time    TSH 1.92 08/27/2022 04:35 PM     Troponin: No results found for: TROPONINT  Lactic Acid:   Recent Labs     03/28/24  0821 03/28/24  1520   LACTA 2.4* 2.1*     BNP: No results for input(s): PROBNP in the last 72 hours.  UA:  Lab Results   Component Value Date/Time    NITRU Negative 03/28/2024 02:03 AM    COLORU Yellow 03/28/2024 02:03 AM    PHUR 6.0 03/28/2024 02:03 AM    PHUR 5.0 03/28/2024 02:03 AM    PHUR 5.5 02/25/2017 12:22 PM    WBCUA 3-5 03/28/2024 02:03 AM    RBCUA 5-10 03/28/2024 02:03 AM    BACTERIA 3+ 03/28/2024 02:03 AM    CLARITYU Clear 03/28/2024 02:03 AM    LEUKOCYTESUR TRACE 03/28/2024 02:03 AM    UROBILINOGEN 0.2 03/28/2024 02:03 AM    BILIRUBINUR Negative 03/28/2024 02:03 AM    BLOODU SMALL 03/28/2024 02:03 AM    GLUCOSEU Negative 03/28/2024 02:03 AM    KETUA Negative 03/28/2024 02:03 AM     Urine Cultures:   Lab Results   Component Value Date/Time    LABURIN 25,000 CFU/ml  PBP2= Negative   03/28/2024 01:54 AM     Blood Cultures:   Lab Results   Component Value Date/Time    [facility]  03/27/2024 11:42 PM     Gram stain Aerobic bottle:  Gram positive cocci in clusters  resembling Staphylococcus  Information to follow  Gram stain Anaerobic bottle:  Gram positive cocci in clusters  resembling Staphylococcus  Information to follow      [facility]  03/27/2024 11:42 PM     mecA gene not detected  See additional report for complete BCID panel.  No resistance genes detected.  Negative results for this marker do not indicate susceptibility,  as multiple mechanisms of resistance to Methicillin exist.  Please see full susceptibility report.      [facility]  03/27/2024 11:42 PM     POSITIVE  Sensitivity to follow  Isolated two of two sets       Lab Results   Component Value Date/Time    BLOODCULT2  03/27/2024 11:42 PM     Gram stain Aerobic bottle:  Gram positive cocci in clusters  resembling Staphylococcus  Information to follow  Gram stain Anaerobic bottle:  Gram positive cocci in clusters  resembling  Staphylococcus  Information to follow      BLOODCULT2  03/27/2024 11:42 PM     POSITIVE  No further workup  Isolated two of two sets  Refer to culture [id number] for sensitivity results       Organism:   Lab Results   Component Value Date/Time    ORG Staphylococcus aureus 03/28/2024 01:54 AM         Electronically signed by Marne DELENA Creighton, MD on 03/30/2024 at 12:21 PM  Comment: Please note this report has been produced using speech recognition software and may contain errors related to that system including errors in grammar, punctuation, and spelling, as well  as words and phrases that may be inappropriate. If there are any questions or concerns, please feel free to contact the dictating provider for clarification.   "

## 2024-03-30 NOTE — Consults (Signed)
 "Infectious Diseases Inpatient Consult Note    Reason for Consult:   MSSA bacteremia. Appreciate recommendations for abx treatment and duration.  Requesting Physician:   Evern Aid, Sheree  Primary Care Physician:  Alexandro Bernice BRAVO, [name]  History Obtained From:   Pt, EPIC    Admit Date: 03/26/2024  Hospital Day: 5    CHIEF COMPLAINT:       Chief Complaint   Patient presents with    Shortness of Breath     C/o Shortness of breath that started this week     Headache     C/o headaches on and off x 6 months, base of neck up to left side of head     OTHER     Out of MEDS since September        HISTORY OF PRESENT ILLNESS:    Shelly Richard is a 38 y.o. female with a medical history significant for type 2 diabetes, mixed hyperlipidemia, and hypertension, who presented from home to the ED on 03/27/2024, with headache and shortness of breath. Of note, patient reportedly had not had health insurance coverage since September, and as a result has not been taking any of her medications. On presentation to the ED patient was tachycardic, hypertensive, and had a leukocytosis. Patient was admitted for DKA.     Initial infectious workup was negative. However, over the following days the patient remained tachycardic and became febrile with a Tmax 102.9 and was started on ceftriaxone  secondary to a UA with 3+ bacteria and +LE. Blood cultures sent on 03/27/2024 resulted in MSSA on 12/15 and patient was started on vancomycin .     Today, patient reports being tired but states that she feels much better than on presentation. She remains tachycardic but afebrile. She denies any systemic or urological signs/symptoms of infection. She denies any recent history of trauma or skin lesions. She is still endorsing left hip pain. Leukocytosis increasing today.         Past Medical History:    Past Medical History:   Diagnosis Date    Acute headache     Allergies     Asthma     Diabetes mellitus (HCC)     Genital herpes     HTN  (hypertension)     Hyperlipidemia     Hypothyroid     Urticaria        Past Surgical History:    Past Surgical History:   Procedure Laterality Date    CYST REMOVAL      HYSTEROSCOPY      x2    MYOMECTOMY N/A 02/01/2022    ROBOTIC MYOMECTOMY performed by Foley, Chauncey Sharper, MD at [facility] OR       Current Medications:     insulin  glargine  30 Units SubCUTAneous Nightly    insulin  lispro  0-8 Units SubCUTAneous 4x Daily AC & HS    pantoprazole   40 mg Oral QAM AC    nafcillin   2,000 mg IntraVENous Q4H    levothyroxine   25 mcg Oral Daily    insulin  lispro  7 Units SubCUTAneous TID WC    sodium chloride  flush  5-40 mL IntraVENous 2 times per day    [Held by provider] enoxaparin   40 mg SubCUTAneous Daily       Allergies:  Ace inhibitors, Lisinopril, Peanuts [peanut oil], and Walnut    Social History:    TOBACCO:    None  ETOH:    None  DRUGS:  None  OCCUPATION:   Teacher aid    Patient lives at home.    Family History:   No immunodeficiency    REVIEW OF SYSTEMS:    No fever / chills / sweats.  No weight loss.  No visual change, eye pain, eye discharge.    No oral lesion, sore throat, dysphagia.  Denies cough / sputum.   Denies chest pain, palpitations.  Denies n / v / abd pain.  No diarrhea.  Denies dysuria or change in urinary function.  Denies joint swelling or pain.  No myalgia, arthralgia.  Denies skin changes, itching  Denies focal weakness, sensory change or other neurologic symptom    Denies new / worse depression, psychiatric symptoms    PHYSICAL EXAM:      Vitals:    BP 108/64   Pulse (!) 122   Temp 99.7 F (37.6 C) (Oral)   Resp 19   Ht 1.626 m (5' 4)   Wt 79.5 kg (175 lb 4.3 oz)   LMP 03/23/2024   SpO2 97%   BMI 30.08 kg/m     GENERAL: No apparent distress.    HEENT: Membranes moist, no oral lesion  NECK:  Supple, no lymphadenopathy  LUNGS: Clear b/l, no rales, no dullness  CARDIAC: RRR, no murmur appreciated  ABD:  + BS, soft / NT  EXT:  No rash, no edema, no lesions. Left hip tender with passive  ROM.  NEURO: No focal neurologic findings  PSYCH: Orientation, sensorium, mood normal  LINES:  Peripheral iv    DATA:    Lab Results   Component Value Date    WBC 17.7 (H) 03/30/2024    HGB 5.7 (LL) 03/30/2024    HCT 18.4 (LL) 03/30/2024    MCV 78.3 (L) 03/30/2024    PLT 370 03/30/2024     Lab Results   Component Value Date    CREATININE 0.6 03/29/2024    CREATININE 0.6 03/29/2024    BUN 5 (L) 03/29/2024    BUN 5 (L) 03/29/2024    NA 132 (L) 03/29/2024    NA 133 (L) 03/29/2024    K 4.0 03/29/2024    K 4.2 03/29/2024    CL 100 03/29/2024    CL 100 03/29/2024    CO2 20 (L) 03/29/2024    CO2 18 (L) 03/29/2024       Hepatic Function Panel:   Lab Results   Component Value Date/Time    ALKPHOS 154 11/25/2023 02:44 PM    ALT 16 11/25/2023 02:44 PM    AST 15 11/25/2023 02:44 PM    BILITOT 0.7 11/25/2023 02:44 PM    BILIDIR <0.2 02/03/2014 06:04 PM    IBILI see below 02/03/2014 06:04 PM       Micro:  Previous Cultures:        Results         Procedure Component Value Units Date/Time     Culture, Urine [[id number]]       Order Status: Sent Specimen: Urine, Clean Catch       Urine Culture [[id number]] Collected: 03/28/24 0154     Order Status: Sent Specimen: Urine voided Updated: 03/28/24 1113     Culture, Blood 1 [[id number]] Collected: 03/27/24 2342     Order Status: Completed Specimen: Blood Updated: 03/29/24 0115       Blood Culture, Routine No Growth to date.  Any change in status will be called.     Narrative:       ORDER#:  [id number]                          ORDERED BY: GENSIC, ANNA  SOURCE: Blood                              COLLECTED:  03/27/24 23:42  ANTIBIOTICS AT COLL.:                      RECEIVED :  03/28/24 19:23  If child <=2 yrs old please draw pediatric bottle.~Blood Culture 1     Culture, Blood 2 [[id number]] Collected: 03/27/24 2342     Order Status: Completed Specimen: Blood Updated: 03/29/24 0115       Culture, Blood 2 No Growth to date.  Any change in status will be called.     Narrative:       ORDER#:  [id number]                          ORDERED BY: GENSIC, ANNA  SOURCE: Blood                              COLLECTED:  03/27/24 23:42  ANTIBIOTICS AT COLL.:                      RECEIVED :  03/28/24 19:23  If child <=2 yrs old please draw pediatric bottle.~Blood Culture #2     COVID-19 & Influenza Combo [[id number]] Collected: 03/27/24 1607     Order Status: Completed Specimen: Nasopharyngeal Swab Updated: 03/27/24 1741       SARS-CoV-2 RNA, RT PCR NOT DETECTED       Comment: Not Detected results do not preclude SARS-CoV-2 infection and  should not be used as the sole basis for patient management  decisions.  Results must be combined with clinical observations,  patient history, and epidemiological information.     Testing was performed using [employer] SARS-CoV-2 and Influenza A/B  nucleic acid assay. This test is a multiplex Real-Time Reverse  Transcriptase Polymerase Chain Reaction (RT-PCR)-based in vitro  diagnostic test intended for the qualitative detection of nucleic  acids from SARS-CoV-2, influenza A, and influenza B in nasopharyngeal  and nasal swab specimens.     Patient Fact Sheet:  salonclasses.at  Provider Fact Sheet: coursepreviews.dk  EUA: [email or web address].[email or web address]  IFU: [email or web address].at     Methodology:  RT-PCR             Influenza A NOT DETECTED       Influenza B NOT DETECTED         Susceptibility (03/30/24)    Staphylococcus aureus     Antibiotic Interpretation MIC  Method Status    levofloxacin Resistant 4 mcg/mL BACTERIAL SUSCEPTIBILITY PANEL BY MIC     linezolid Sensitive 2 mcg/mL BACTERIAL SUSCEPTIBILITY PANEL BY MIC     nitrofurantoin Sensitive <=16 mcg/mL BACTERIAL SUSCEPTIBILITY PANEL BY MIC     oxacillin Sensitive <=0.25 mcg/mL BACTERIAL SUSCEPTIBILITY PANEL BY MIC     tetracycline Sensitive <=1 mcg/mL BACTERIAL SUSCEPTIBILITY PANEL BY MIC     trimethoprim-sulfamethoxazole Sensitive <=10 mcg/mL  BACTERIAL SUSCEPTIBILITY PANEL BY MIC     vancomycin  Sensitive 1 mcg/mL BACTERIAL SUSCEPTIBILITY PANEL BY MIC              IMPRESSION:  Patient Active Problem List   Diagnosis    Mixed hyperlipidemia    Iron  deficiency anemia    Type 2 diabetes mellitus with hyperglycemia, with long-term current use of insulin  (HCC)    Essential hypertension    Acquired hypothyroidism    Mild intermittent asthma without complication    Allergic rhinitis    Heart murmur    Uterine fibroid    Class 1 obesity due to excess calories with serious comorbidity and body mass index (BMI) of 31.0 to 31.9 in adult    Abnormal uterine bleeding    Eczema    Glaucoma suspect, bilateral    Hidradenitis axillaris    HSV-2 infection    Myopic astigmatism, bilateral    Strabismus    S/P myomectomy    DKA, type 2, not at goal [facility])    Acute nonintractable headache    MSSA bacteremia    Diabetic ketoacidosis without coma associated with type 2 diabetes mellitus [facility])         RECOMMENDATIONS:  Shelly Richard is a 38 y.o. female with a medical history significant for type 2 diabetes, mixed hyperlipidemia, and hypertension, who presented from home to the ED on 03/27/2024, with headache and shortness of breath and being managed for DKA and bacteremia.    Sepsis  MSSA bacteremia  Etiology currently unknown  Patient presented in DKA secondary to medicaiton non compliance and repeatedly developed episodes of fever. She met sepsis criteria as she was tachycardic and tachypnic with leukocytosis. She was started on ceftriaxone  12/14 for concerns of UTI and started on vancomycin  thereafter on 12/15 when blood cultures resulted with MSSA. Patient reporting left hip pain but denies recent trauma or skin break down, surgery or skin instrumentation, or dental work. She denies IVDU.   - Follow-up TTE  - Vancomycin  deescalated to nafcillin  2 g IV every 4 hours on 12/15  - continue nafcillin    - PICC for IV antibiotics at discharge  - Follow-up repeat blood  cultures  - Consider orthopedic consult to evaluate left hip pain       DKA - resolved  - has been transitioned from insulin  drip to lantus  and lispro and is tolerating PO intake    Chronic medical conditions:  Hypothyroidism - home levothyroxine   Hypertension - home losartan   Hyperlipidemia -  home simvastatin       Medical Decision Making:  The following items were considered in medical decision making:  Discussion of patient care with other providers  Reviewed clinical lab tests  Reviewed radiology tests  Reviewed other diagnostic tests/interventions  Independent review of radiologic images  Microbiology cultures and other micro tests reviewed      Risk of Complications/Morbidity: High   Illness(es)/ Infection present that pose threat to bodily function.   There is potential for severe exacerbation of infection/side effects of treatment.  Therapy requires intensive monitoring for antimicrobial agent toxicity    Discussed with Dr. He.    Dorn Ovens, MD  Internal Medicine, PGY-3  "

## 2024-03-31 ENCOUNTER — Inpatient Hospital Stay: Admit: 2024-03-31 | Payer: Medicaid (Managed Care) | Primary: Oncology

## 2024-03-31 LAB — POCT GLUCOSE
POC Glucose: 282 mg/dL — ABNORMAL HIGH (ref 70–99)
POC Glucose: 266 mg/dL — ABNORMAL HIGH (ref 70–99)
POC Glucose: 186 mg/dL — ABNORMAL HIGH (ref 70–99)
POC Glucose: 150 mg/dL — ABNORMAL HIGH (ref 70–99)
POC Glucose: 186 mg/dL — ABNORMAL HIGH (ref 70–99)
POC Glucose: 197 mg/dL — ABNORMAL HIGH (ref 70–99)

## 2024-03-31 LAB — CBC WITH AUTO DIFFERENTIAL
Basophils %: 0.3 %
Basophils Absolute: 0 K/uL (ref 0.0–0.2)
Eosinophils %: 0.5 %
Eosinophils Absolute: 0.1 K/uL (ref 0.0–0.6)
Hematocrit: 19.9 % — CL (ref 36.0–48.0)
Hemoglobin: 6.6 g/dL — CL (ref 12.0–16.0)
Lymphocytes %: 11.1 %
Lymphocytes Absolute: 1.9 K/uL (ref 1.0–5.1)
MCH: 25.7 pg — ABNORMAL LOW (ref 26.0–34.0)
MCHC: 33.1 g/dL (ref 31.0–36.0)
MCV: 77.6 fL — ABNORMAL LOW (ref 80.0–100.0)
MPV: 8.4 fL (ref 5.0–10.5)
Monocytes %: 7.8 %
Monocytes Absolute: 1.3 K/uL (ref 0.0–1.3)
Neutrophils %: 80.3 %
Neutrophils Absolute: 13.8 K/uL — ABNORMAL HIGH (ref 1.7–7.7)
Platelets: 412 K/uL (ref 135–450)
RBC: 2.57 M/uL — ABNORMAL LOW (ref 4.00–5.20)
RDW: 17.1 % — ABNORMAL HIGH (ref 12.4–15.4)
WBC: 17.1 K/uL — ABNORMAL HIGH (ref 4.0–11.0)

## 2024-03-31 LAB — CULTURE, BLOOD 1
Blood Culture, Routine: NOT DETECTED
Blood Culture, Routine: POSITIVE
Organism: DETECTED — AB

## 2024-03-31 LAB — EKG 12-LEAD
Atrial Rate: 141 {beats}/min
Atrial Rate: 135 {beats}/min
P Axis: 62 degrees
P Axis: 51 degrees
P-R Interval: 128 ms
P-R Interval: 148 ms
Q-T Interval: 340 ms
Q-T Interval: 278 ms
QRS Duration: 70 ms
QRS Duration: 68 ms
QTc Calculation (Bazett): 520 ms
QTc Calculation (Bazett): 417 ms
R Axis: 22 degrees
R Axis: 21 degrees
T Axis: 19 degrees
T Axis: 21 degrees
Ventricular Rate: 141 {beats}/min
Ventricular Rate: 135 {beats}/min

## 2024-03-31 LAB — BASIC METABOLIC PANEL W/ REFLEX TO MG FOR LOW K
Anion Gap: 12 (ref 3–16)
BUN: 4 mg/dL — ABNORMAL LOW (ref 7–20)
CO2: 26 mmol/L (ref 21–32)
Calcium: 7.9 mg/dL — ABNORMAL LOW (ref 8.3–10.6)
Chloride: 97 mmol/L — ABNORMAL LOW (ref 99–110)
Creatinine: 0.6 mg/dL (ref 0.6–1.1)
Est, Glom Filt Rate: >90
Glucose: 163 mg/dL — ABNORMAL HIGH (ref 70–99)
Potassium reflex Magnesium: 3.1 mmol/L — ABNORMAL LOW (ref 3.5–5.1)
Sodium: 135 mmol/L — ABNORMAL LOW (ref 136–145)

## 2024-03-31 LAB — PREPARE RBC (CROSSMATCH): Dispense Status Blood Bank: TRANSFUSED

## 2024-03-31 LAB — CULTURE, BLOOD 2: Culture, Blood 2: POSITIVE

## 2024-03-31 LAB — MPO AND PR3 AB REFLEX
Myeloperoxidase Ab: 0 [AU]/ml (ref 0–19)
Serine Protease 3 Ab: 0 [AU]/ml (ref 0–19)

## 2024-03-31 LAB — HEMOGLOBIN AND HEMATOCRIT
Hematocrit: 22.5 % — ABNORMAL LOW (ref 36.0–48.0)
Hemoglobin: 7 g/dL — ABNORMAL LOW (ref 12.0–16.0)

## 2024-03-31 LAB — LACTIC ACID: Lactic Acid: 1.3 mmol/L (ref 0.4–2.0)

## 2024-03-31 LAB — MAGNESIUM: Magnesium: 1.97 mg/dL (ref 1.80–2.40)

## 2024-03-31 MED ORDER — GADOTERIDOL 279.3 MG/ML IV SOLN
279.3 | Freq: Once | INTRAVENOUS | Status: AC | PRN
Start: 2024-03-31 — End: 2024-03-31
  Administered 2024-03-31: 23:00:00 17 mL via INTRAVENOUS

## 2024-03-31 MED ORDER — OXYCODONE-ACETAMINOPHEN 5-325 MG PO TABS
5-325 | ORAL | Status: DC | PRN
Start: 2024-03-31 — End: 2024-04-02
  Administered 2024-03-31 – 2024-04-02 (×6): 1 via ORAL

## 2024-03-31 MED FILL — LEVOTHYROXINE SODIUM 50 MCG PO TABS: 50 ug | ORAL | Qty: 1 | Fill #0

## 2024-03-31 MED FILL — NAFCILLIN SODIUM 2 G IJ SOLR: 2 g | INTRAMUSCULAR | Qty: 2000 | Fill #0

## 2024-03-31 MED FILL — INSULIN LISPRO 100 UNIT/ML IJ SOLN: 100 [IU]/mL | INTRAMUSCULAR | Qty: 4

## 2024-03-31 MED FILL — ACETAMINOPHEN 325 MG PO TABS: 325 mg | ORAL | Qty: 2 | Fill #0

## 2024-03-31 MED FILL — PANTOPRAZOLE SODIUM 40 MG PO TBEC: 40 mg | ORAL | Qty: 1 | Fill #0

## 2024-03-31 MED FILL — NAFCILLIN SODIUM 2 G IJ SOLR: 2 g | INTRAMUSCULAR | Qty: 2000

## 2024-03-31 MED FILL — ACETAMINOPHEN 325 MG PO TABS: 325 mg | ORAL | Qty: 2

## 2024-03-31 MED FILL — INSULIN LISPRO 100 UNIT/ML IJ SOLN: 100 [IU]/mL | INTRAMUSCULAR | Qty: 7 | Fill #0

## 2024-03-31 MED FILL — PERCOCET 5-325 MG PO TABS: 5-325 mg | ORAL | Qty: 1 | Fill #0

## 2024-03-31 MED FILL — INSULIN LISPRO 100 UNIT/ML IJ SOLN: 100 [IU]/mL | INTRAMUSCULAR | Qty: 2 | Fill #0

## 2024-03-31 MED FILL — HYDROMORPHONE HCL 1 MG/ML IJ SOLN: 1 mg/mL | INTRAMUSCULAR | Qty: 1 | Fill #0

## 2024-03-31 MED FILL — LANTUS 100 UNIT/ML SC SOLN: 100 [IU]/mL | SUBCUTANEOUS | Qty: 30

## 2024-03-31 NOTE — Progress Notes (Signed)
"  4 Eyes Skin Assessment     NAME:  Shelly Richard  DATE OF BIRTH:  1986-02-07  MEDICAL RECORD NUMBER:  [id number]    The patient is being assessed for  Transfer to New Unit    I agree that at least one RN has performed a thorough Head to Toe Skin Assessment on the patient. ALL assessment sites listed below have been assessed.      Areas assessed by both nurses:    Head, Face, Ears, Shoulders, Back, Chest, Arms, Elbows, Hands, Sacrum. Buttock, Coccyx, Ischium, Legs. Feet and Heels, and Under Medical Devices         Does the Patient have a Wound? No noted wound(s)       Braden Prevention initiated by RN: No  Wound Care Orders initiated by RN: No    For hospital-acquired stage 1 & 2 and ALL Stage 3,4, Unstageable, DTI, NWPT, and Complex wounds: place order IP Wound Care/Ostomy Nurse Eval and Treat by RN under ORDER ENTRY: No    New Ostomies, if present place, Ostomy referral order under ORDER ENTRY: No     Nurse 1 eSignature: Electronically signed by Rumaldo Bunker, RN on 03/31/24 at 11:33 AM EST    **SHARE this note so that the co-signing nurse can place an eSignature**    Nurse 2 eSignature: Electronically signed by Cassondra Gallus, RN on 03/31/24 at 11:47 AM EST   "

## 2024-03-31 NOTE — Progress Notes (Signed)
 "    V2.0    USACS Progress Note      Name:  Shelly Richard DOB/Age/Sex: 03/13/1986  (38 y.o. female)   MRN & CSN:  [id number] & [id number] Encounter Date/Time: 03/31/2024 12:21 PM EST   Location:  4516/4516-01 PCP: Alexandro Bernice FORBES, [name]     Attending:Antwuan Eckley, Marne LABOR, MD       Hospital Day: 6  Subjective:     Chief Complaint: Sepsis    Shelly Richard is a 38 y.o. female who presents with sepsis    Subjective:    Patient still feels sick.  Appetite still poor.  Spiking a fever      Assessment and Recommendations   HPI per admitting provider :      Hospital course:    Shelly Richard is a 38 y.o. female with pmh of diabetes, hypertension, hyperlipidemia who presents with DKA, type 2, not at goal [facility]) with sepsis.  Patient has not been taking her medications due to insurance issues.  DKA was treated with an insulin  drip and now switched to subcutaneous insulin .  Workup revealed MSSA bacteremia      Plan:     DKA resolved    Uncontrolled type 2 diabetes-blood sugars better  - On Lantus  with Premeal insulin , if sugars are high will increase dose of insulin  tomorrow  - On sliding scale insulin   - As patient is on insulin  monitor blood sugars hypoglycemia    Sepsis-white count still elevated, still spiking fever off-and-on  - Present on admission  - Still spiking fever    MSSA bacteremia-repeat blood culture positive  - Was initially on vancomycin  switched to nafcillin  on 12/15  - Repeat blood culture pending  - TEE pending      Acute on chronic anemia-posttransfusion hemoglobin stable around 7  -Hemoglobin dropped from 10.9 to 5.7  -Iron  studies shows iron  deficiency but with bacteremia will hold off IV iron   - In the past patient needed iron  transfusions  - In the past patient had a lot of menstrual bleeding and was on Femara for fibroid    Hypothyroidism  -Resume low-dose Levothyroid    Hip pain  - CT shows some fluid collection  - Ortho consulted recommend ambulation as tolerated  - With persistent  MSSA bacteremia, MRI hip ordered if it shows a collection will aspirate            I spent _   minutes in the care of this patient.  Over 50% of that time was in face-to-face counseling regarding disease process, diagnostic testing, preventative measures, and answering patient and family questions.     Diet ADULT DIET; Regular; 3 carb choices (45 gm/meal)   DVT Prophylaxis []  Lovenox , []   Heparin, []  SCDs, []  Ambulation,  []  Eliquis, []  Xarelto  []  Coumadin   Code Status Full Code   Disposition   Unclear     Surrogate Decision Maker/ POA Spouse       MDM  [x]  High (any 2 of A, B, or C)    A. Problems (any 1)  [x]  Acute/Chronic Illness/injury posing threat to life or bodily function:    []  Severe exacerbation of chronic illness:    ---------------------------------------------------------------------  B. Risk of Treatment (any 1)   []  IV ABX requiring serial renal monitoring for nephrotoxicity:     [x]  IV Narcotic analgesia for adverse drug reaction  []  IV diuresis requiring serial monitoring for renal impairment and electrolyte derangements  []   Critical electrolyte abnormalities requiring IV replacement and close serial monitoring  [x]  Insulin  - monitoring serial FSBS for Hypoglycemic adverse drug reaction  []  Anticoagulation requiring serial monitoring of coagulation factors  []  IV/IM Controlled Substances order (any 1)   []  One-time Order including Drug Name/Route, Reason Ordered:   []  Scheduled Order including Drug Name/Route, Reason Ordered or Continued:   []  PRN Order including Drug Name/Route, Reason Ordered or Continued:  [] Other -   []  Decision to De-escalate Care this DOS due to change in treatment goals:  []  Decision to Escalate Care To:   []  Major Surgery/Procedure (any 1):   Elective with patient risk factors including Procedure Type and Risk Factors:   Emergent Procedure Type:    ----------------------------------------------------------------------  C. Data (any 2)  []  Data Review (3+ points)  []   Consultant Note reviewed with note date, specialty, and summary (1 point each)  [x]  All current labs were reviewed and interpreted for clinical significance   []  Studies Reviewed (1 point each):   []  Collateral history obtained including from who and why needed (Max 1 point):  []  Independent (Tyashia Morrisette DELENA Creighton, MD) Interpretation of tests (any 1)  []  Rhythm Strip (Telemetry) personally reviewed and interpreted as documented above    []  Imaging personally reviewed and interpreted, includes:    [x]  Discussion (any 1)  [x]  Discussed the discharge plan in detail with case management including timing/barriers to discharge, need for support services and/or placement decision   []  Discussed management of the case with:         Review of Systems:      Pertinent positives and negatives discussed in HPI    Objective:     Intake/Output Summary (Last 24 hours) at 03/31/2024 0830  Last data filed at 03/31/2024 9187  Gross per 24 hour   Intake 2129.42 ml   Output 1900 ml   Net 229.42 ml      Vitals:   Vitals:    03/31/24 0600 03/31/24 0700 03/31/24 0752 03/31/24 0801   BP:  132/75     Pulse: (!) 111      Resp: 19   21   Temp:   (!) 100.6 F (38.1 C)    TempSrc:   Oral    SpO2: 95%      Weight:       Height:             Physical Exam:      General: NAD  Eyes: EOMI  ENT: neck supple  Cardiovascular: Regular rate.  Respiratory: Clear to auscultation  Gastrointestinal: Soft, non tender  Genitourinary: no suprapubic tenderness  Musculoskeletal: No edema  Skin: warm, dry  Neuro: Alert.  Psych: Mood appropriate.         Medications:   Medications:    insulin  glargine  30 Units SubCUTAneous Nightly    insulin  lispro  0-8 Units SubCUTAneous 4x Daily AC & HS    pantoprazole   40 mg Oral QAM AC    nafcillin   2,000 mg IntraVENous Q4H    levothyroxine   25 mcg Oral Daily    insulin  lispro  7 Units SubCUTAneous TID WC    sodium chloride  flush  5-40 mL IntraVENous 2 times per day    [Held by provider] enoxaparin   40 mg SubCUTAneous Daily       Infusions:    sodium chloride       sodium chloride       dextrose  5 % and 0.45 % NaCl Stopped (03/28/24 2303)  dextrose        PRN Meds: sodium chloride , , PRN  dextrose  bolus, 125 mL, PRN   Or  dextrose  bolus, 250 mL, PRN  magnesium  sulfate, 2,000 mg, PRN  sodium phosphate  15 mmol in sodium chloride  0.9 % 250 mL IVPB, 15 mmol, PRN  sodium chloride  flush, 5-40 mL, PRN  sodium chloride , , PRN  magnesium  sulfate, 2,000 mg, PRN  ondansetron , 4 mg, Q8H PRN   Or  ondansetron , 4 mg, Q6H PRN  polyethylene glycol, 17 g, Daily PRN  acetaminophen , 650 mg, Q6H PRN   Or  acetaminophen , 650 mg, Q6H PRN  dextrose  5 % and 0.45 % NaCl, , Continuous PRN  glucose, 4 tablet, PRN  glucagon , 1 mg, PRN  dextrose , , Continuous PRN  albuterol , 2.5 mg, Q6H PRN  HYDROmorphone , 0.25 mg, Q3H PRN   Or  HYDROmorphone , 0.5 mg, Q3H PRN        Labs and Imaging   CT SOFT TISSUE NECK WO CONTRAST  Result Date: 03/28/2024  DATE: March 28, 2024 EXAM: CT SOFT TISSUE NECK WO CONTRAST INDICATION: neck pain, fever; COMPARISON: None. TECHNIQUE: Axial CT imaging obtained from the skull base through the lung apices. Axial images and multiplanar reformatted images reviewed.   Up-to-date CT equipment and radiation dose reduction techniques were employed. IV Contrast: None FINDINGS: SCOUT: Nondiagnostic scout image(s) obtained for localization purposes, not for diagnostic use DEVICES/HARDWARE: None . ORBITS, PARANASAL SINUSES, SKULL BASE: Limited images of the base of the brain demonstrate no mass effect. Orbits are normal. Imaged sinuses are clear. CERVICAL SPINE/BONES: No destructive process. NASAL CAVITY/NASOPHARYNX: No mass. ORAL CAVITY/OROPHARYNX: No evidence of mass. HYPOPHARYNX AND LARYNX: Normal. DEEP NECK SPACES: The parapharyngeal, retropharyngeal and masticator spaces are normal. SALIVARY GLANDS: Submandibular glands, sublingual glands and parotid glands are normal. THYROID GLAND: Normal. LYMPH NODES: Nonenlarged cervical lymph nodes bilaterally.  THORACIC INLET: No mass or acute abnormality involving the visualized mediastinal structures, trachea or lung apices. OTHER: No significant abnormality.     1. Negative. This dictation was created with voice recognition software.  While attempts have been made to review the dictation as it was transcribed, occasionally the spoken word can be misinterpreted  by the technology, leading to omissions or inappropriate words or phrases.  If questions or concerns arise, please contact the Radiology Department. Electronically signed by Elouise Hugh, MD    CT ABDOMEN PELVIS WO CONTRAST Additional Contrast? None  Result Date: 03/28/2024  DATE: 03/28/2024 EXAM: CT ABDOMEN PELVIS WO CONTRAST INDICATION: hip pain Pain COMPARISON: 2015 TECHNIQUE: Axial CT imaging obtained from lung bases through pelvis. Axial images and multiplanar reformatted images are provided for review.  Up-to-date CT equipment and radiation dose reduction techniques were employed. IV Contrast: None Oral Contrast: No. FINDINGS: SCOUT: Nondiagnostic scout image(s) obtained for localization purposes, not for diagnostic use DEVICES/HARDWARE: None . LUNG BASES: Clear. LIVER: There is decreased in attenuation. Liver is up to 25 cm in craniocaudal dimension. GALLBLADDER AND BILIARY TREE: No calcified gallstones. No gallbladder distention.  No intra- or extrahepatic biliary dilatation. PANCREAS: Normal. SPLEEN: Spleen is up to 14 cm in craniocaudal dimension. ADRENAL GLANDS: Normal. KIDNEYS AND URETERS: No hydronephrosis. Focal areas of striated enhancement of the kidneys bilaterally. Mild perinephric haziness bilaterally without fluid collection. BOWEL: Normal diameter, nonobstructed.  Appendix is not dilated or thickened. Distal colonic diverticulosis. URINARY BLADDER: Normal. REPRODUCTIVE ORGANS: No associated masses. LYMPH NODES: No abnormally enlarged nodes. PERITONEUM/RETROPERITONEUM: No ascites or free air. VESSELS: Aorta and IVC without significant  abnormality.  ABDOMINAL WALL: Mild periarticular edema or swelling of the left hip. BONES: No significant abnormality. OTHER FINDINGS: None.     1. Diverticulosis of the colon without evidence of diverticulitis. 2. Focal areas of striated enhancement of the kidneys, previous CT contrast administration. Findings could indicate renal dysfunction or infection. 3. Mild periarticular edema or swelling of the left hip suggesting muscular injury/strain. 4. Hepatosplenomegaly. This dictation was created with voice recognition software.  While attempts have been made to review the dictation as it was transcribed, occasionally the spoken word can be misinterpreted  by the technology, leading to omissions or inappropriate words or phrases.  If questions or concerns arise, please contact the Radiology Department. Electronically signed by Elouise Hugh, MD    CT HEAD WO CONTRAST  Result Date: 03/28/2024  EXAM: CT HEAD WO CONTRAST STUDY DATE: 03/28/2024 11:15 EST INDICATION: neck pain COMPARISON: None TECHNIQUE: Standard per department protocol. Up-to-date CT equipment and radiation dose reduction techniques were employed. CONTRAST: No intravenous FINDINGS: Brain Parenchyma: No midline shift, mass effect, parenchymal hemorrhage, or evidence of acute territorial infarct. Ventricular System and Extra-Axial Spaces: No extra-axial fluid collections. Basilar cisterns are patent. No hydrocephalus. Extracranial Structures: No significant paranasal sinus disease. No calvarial abnormality. No orbit abnormality.     *  No acute intracranial findings. Electronically signed by Isom Repress    CT CHEST PULMONARY EMBOLISM W CONTRAST  Result Date: 03/27/2024  DATE: 03/27/2024 EXAM: CT CHEST PULMONARY EMBOLISM W CONTRAST INDICATION: c/f PE, Chest pain COMPARISON: None. TECHNIQUE: Axial CT imaging obtained through the chest using CT pulmonary angiogram protocol. Axial images, multiplanar reformatted images and maximum intensity projection images  were reviewed.  Up-to-date CT equipment and radiation dose reduction techniques were employed.  IV Contrast Media and volume: 80 cc iodinated contrast FINDINGS: SCOUT: Nondiagnostic scout image(s) obtained for localization purposes, not for diagnostic use DEVICES/HARDWARE: None . DIAGNOSTIC QUALITY: Suboptimal. PULMONARY EMBOLI: None. RIGHT HEART STRAIN: None. LUNGS AND AIRWAYS: Airways are patent.  Lungs are clear. PLEURA: No pleural effusions or significant pleural thickening. HEART / GREAT VESSELS: Heart is not enlarged. ADENOPATHY: None. CHEST WALL / LOWER NECK: No significant abnormality. UPPER ABDOMEN: Normal. BONES: No significant abnormality. OTHER FINDINGS: None.     1. No evidence of pulmonary embolus. This dictation was created with voice recognition software.  While attempts have been made to review the dictation as it was transcribed, occasionally the spoken word can be misinterpreted  by the technology, leading to omissions or inappropriate words or phrases.  If questions or concerns arise, please contact the Radiology Department. Electronically signed by Elouise Hugh, MD    XR CHEST PORTABLE  Result Date: 03/27/2024  Chest HISTORY : Dyspnea VIEWS : 1     Clear lungs. Normal cardiac mediastinal silhouette. Electronically signed by Lynwood Schwalbe, MD      CBC:   Recent Labs     03/30/24  0414 03/30/24  0610 03/30/24  1351 03/31/24  0112   WBC 19.0* 17.7*  --  17.1*   HGB 6.6* 5.7* 7.0* 6.6*   PLT 418 370  --  412     BMP:    Recent Labs     03/28/24  2307 03/29/24  0225 03/29/24  1448   NA 136 132* 133*  132*   K 3.5 3.6 4.2  4.0   CL 102 101 100  100   CO2 23 21 18*  20*   BUN 4* 4* 5*  5*   CREATININE 0.6 0.6 0.6  0.6   GLUCOSE 144* 295* 255*  257*     Hepatic: No results for input(s): AST, ALT, BILITOT, ALKPHOS in the last 72 hours.    Invalid input(s): ALB  Lipids:   Lab Results   Component Value Date/Time    CHOL 154 11/25/2023 02:44 PM    HDL 22 11/25/2023 02:44 PM    TRIG 170  11/25/2023 02:44 PM     Hemoglobin A1C:   Lab Results   Component Value Date/Time    LABA1C 13.9 03/27/2024 03:47 AM     TSH:   Lab Results   Component Value Date/Time    TSH 1.92 08/27/2022 04:35 PM     Troponin: No results found for: TROPONINT  Lactic Acid:   Recent Labs     03/28/24  1520 03/31/24  0113   LACTA 2.1* 1.3     BNP: No results for input(s): PROBNP in the last 72 hours.  UA:  Lab Results   Component Value Date/Time    NITRU Negative 03/28/2024 02:03 AM    COLORU Yellow 03/28/2024 02:03 AM    PHUR 6.0 03/28/2024 02:03 AM    PHUR 5.0 03/28/2024 02:03 AM    PHUR 5.5 02/25/2017 12:22 PM    WBCUA 3-5 03/28/2024 02:03 AM    RBCUA 5-10 03/28/2024 02:03 AM    BACTERIA 3+ 03/28/2024 02:03 AM    CLARITYU Clear 03/28/2024 02:03 AM    LEUKOCYTESUR TRACE 03/28/2024 02:03 AM    UROBILINOGEN 0.2 03/28/2024 02:03 AM    BILIRUBINUR Negative 03/28/2024 02:03 AM    BLOODU SMALL 03/28/2024 02:03 AM    GLUCOSEU Negative 03/28/2024 02:03 AM    KETUA Negative 03/28/2024 02:03 AM     Urine Cultures:   Lab Results   Component Value Date/Time    LABURIN 25,000 CFU/ml  PBP2= Negative   03/28/2024 01:54 AM     Blood Cultures:   Lab Results   Component Value Date/Time    [facility]  03/27/2024 11:42 PM     mecA gene not detected  See additional report for complete BCID panel.  No resistance genes detected.  Negative results for this marker do not indicate susceptibility,  as multiple mechanisms of resistance to Methicillin exist.  Please see full susceptibility report.      BC POSITIVE  Isolated two of two sets   03/27/2024 11:42 PM     Lab Results   Component Value Date/Time    BLOODCULT2  03/27/2024 11:42 PM     POSITIVE  No further workup  Isolated two of two sets  Refer to culture [id number] for sensitivity results       Organism:   Lab Results   Component Value Date/Time    ORG Staphylococcus aureus 03/28/2024 01:54 AM         Electronically signed by Marne DELENA Creighton, MD on 03/31/2024 at 8:30 AM  Comment: Please note this report  has been produced using speech recognition software and may contain errors related to that system including errors in grammar, punctuation, and spelling, as well as words and phrases that may be inappropriate. If there are any questions or concerns, please feel free to contact the dictating provider for clarification.   "

## 2024-03-31 NOTE — Plan of Care (Signed)
 "  Problem: Chronic Conditions and Co-morbidities  Goal: Patient's chronic conditions and co-morbidity symptoms are monitored and maintained or improved  03/31/2024 2135 by Netta Norris, RN  Outcome: Progressing     Problem: Discharge Planning  Goal: Discharge to home or other facility with appropriate resources  03/31/2024 2135 by Netta Norris, RN  Outcome: Progressing     Problem: Safety - Adult  Goal: Free from fall injury  03/31/2024 2135 by Netta Norris, RN  Outcome: Progressing  Flowsheets (Taken 03/31/2024 2129)  Free From Fall Injury: Instruct family/caregiver on patient safety     Problem: Pain  Goal: Verbalizes/displays adequate comfort level or baseline comfort level  03/31/2024 2135 by Netta Norris, RN  Outcome: Progressing     Problem: Nutrition Deficit:  Goal: Optimize nutritional status  03/31/2024 2135 by Netta Norris, RN  Outcome: Progressing     Problem: ABCDS Injury Assessment  Goal: Absence of physical injury  03/31/2024 2135 by Netta Norris, RN  Outcome: Progressing  Flowsheets (Taken 03/31/2024 2129)  Absence of Physical Injury: Implement safety measures based on patient assessment     Problem: Neurosensory - Adult  Goal: Achieves stable or improved neurological status  Outcome: Progressing     Problem: Neurosensory - Adult  Goal: Achieves maximal functionality and self care  Outcome: Progressing     Problem: Cardiovascular - Adult  Goal: Maintains optimal cardiac output and hemodynamic stability  Outcome: Progressing     Problem: Cardiovascular - Adult  Goal: Absence of cardiac dysrhythmias or at baseline  Outcome: Progressing     Problem: Skin/Tissue Integrity - Adult  Goal: Skin integrity remains intact  Outcome: Progressing     Problem: Skin/Tissue Integrity - Adult  Goal: Oral mucous membranes remain intact  Outcome: Progressing     Problem: Musculoskeletal - Adult  Goal: Return mobility to safest level of function  Outcome: Progressing     Problem: Musculoskeletal -  Adult  Goal: Maintain proper alignment of affected body part  Outcome: Progressing     Problem: Musculoskeletal - Adult  Goal: Return ADL status to a safe level of function  Outcome: Progressing     Problem: Infection - Adult  Goal: Absence of infection at discharge  Outcome: Progressing     Problem: Infection - Adult  Goal: Absence of infection during hospitalization  Outcome: Progressing     Problem: Infection - Adult  Goal: Absence of fever/infection during anticipated neutropenic period  Outcome: Progressing     Problem: Metabolic/Fluid and Electrolytes - Adult  Goal: Electrolytes maintained within normal limits  Outcome: Progressing     Problem: Metabolic/Fluid and Electrolytes - Adult  Goal: Hemodynamic stability and optimal renal function maintained  Outcome: Progressing     Problem: Metabolic/Fluid and Electrolytes - Adult  Goal: Glucose maintained within prescribed range  Outcome: Progressing     Problem: Hematologic - Adult  Goal: Maintains hematologic stability  Outcome: Progressing     Problem: Behavior  Goal: Pt/Family maintain appropriate behavior and adhere to behavioral management agreement, if implemented  Description: INTERVENTIONS:  1. Assess patient/family's coping skills and  non-compliant behavior (including use of illegal substances)  2. Notify security of behavior or suspected illegal substances which indicate the need for search of the family and/or belongings  3. Encourage verbalization of thoughts and concerns in a socially appropriate manner  4. Utilize positive, consistent limit setting strategies supporting safety of patient, staff and others  5. Encourage participation in the decision making process about the behavioral management agreement  6. If a visitor's behavior poses a threat to safety call refer to organization policy.  7. Initiate consult with Social Worker, Psychosocial CNS, Spiritual Care as appropriate  Outcome: Progressing     "

## 2024-03-31 NOTE — Plan of Care (Signed)
"    Problem: Safety - Adult  Goal: Free from fall injury  03/31/2024 1402 by Estelle Flaming, RN  Outcome: Progressing  03/31/2024 0935 by Gaile Needles, RN  Outcome: Progressing  Flowsheets (Taken 03/31/2024 0932)  Free From Fall Injury: Instruct family/caregiver on patient safety   Fall precautions in place. Bed alarm on and in lowest position. Call light, belongings, bedside table within reach.     Problem: Pain  Goal: Verbalizes/displays adequate comfort level or baseline comfort level  03/31/2024 1402 by Estelle Flaming, RN  Outcome: Progressing  03/31/2024 0935 by Gaile Needles, RN  Outcome: Progressing   Assessing pain using numeric pain scale. Managing pain with meds per MAR. Using repositioining and pillow support for comfort.  "

## 2024-03-31 NOTE — Plan of Care (Signed)
"    Problem: Chronic Conditions and Co-morbidities  Goal: Patient's chronic conditions and co-morbidity symptoms are monitored and maintained or improved  03/31/2024 0935 by Gaile Needles, RN  Outcome: Progressing  Flowsheets (Taken 03/31/2024 0800)  Care Plan - Patient's Chronic Conditions and Co-Morbidity Symptoms are Monitored and Maintained or Improved:   Monitor and assess patient's chronic conditions and comorbid symptoms for stability, deterioration, or improvement   Collaborate with multidisciplinary team to address chronic and comorbid conditions and prevent exacerbation or deterioration   Update acute care plan with appropriate goals if chronic or comorbid symptoms are exacerbated and prevent overall improvement and discharge  03/30/2024 1943 by Gweneth Savant, RN  Outcome: Progressing     Problem: Discharge Planning  Goal: Discharge to home or other facility with appropriate resources  03/31/2024 0935 by Gaile Needles, RN  Outcome: Progressing  Flowsheets (Taken 03/31/2024 0800)  Discharge to home or other facility with appropriate resources:   Identify barriers to discharge with patient and caregiver   Arrange for needed discharge resources and transportation as appropriate   Identify discharge learning needs (meds, wound care, etc)   Refer to discharge planning if patient needs post-hospital services based on physician order or complex needs related to functional status, cognitive ability or social support system  03/30/2024 1943 by Gweneth Savant, RN  Outcome: Progressing     Problem: Safety - Adult  Goal: Free from fall injury  03/31/2024 0935 by Gaile Needles, RN  Outcome: Progressing  Flowsheets (Taken 03/31/2024 0932)  Free From Fall Injury: Instruct family/caregiver on patient safety  03/30/2024 1943 by Gweneth Savant, RN  Outcome: Progressing     Problem: Pain  Goal: Verbalizes/displays adequate comfort level or baseline comfort level  03/31/2024 0935 by Gaile Needles, RN  Outcome:  Progressing  03/30/2024 1943 by Gweneth Savant, RN  Outcome: Progressing     Problem: Nutrition Deficit:  Goal: Optimize nutritional status  03/31/2024 0935 by Gaile Needles, RN  Outcome: Progressing  03/30/2024 1943 by Gweneth Savant, RN  Outcome: Progressing     Problem: ABCDS Injury Assessment  Goal: Absence of physical injury  03/31/2024 0935 by Gaile Needles, RN  Outcome: Progressing  Flowsheets (Taken 03/31/2024 0932)  Absence of Physical Injury: Implement safety measures based on patient assessment  03/30/2024 1943 by Gweneth Savant, RN  Outcome: Progressing     "

## 2024-03-31 NOTE — Progress Notes (Signed)
"  Patient admitted to room 5514. Report received from ICU RN via telephone. VSS on room air. Patient is a/o x4. Patient oriented to room and call light. Rights and responsibilities provided to patient, and welcome packet provided to patient. 4 eyes skin assessment completed with 2nd RN.    "

## 2024-03-31 NOTE — Progress Notes (Addendum)
"  Pt A&Ox4, VSS on RA with exception of intermittent tachycardia. Pt ambulates x1 SBA with walker/GB. Pt voiding and tolerating PO fluids. Managing pain with PRN pain meds per MAR. Fall precautions in place. Bed alarm on and in lowest position. Plan of care continues.   "

## 2024-03-31 NOTE — Progress Notes (Signed)
"  Order received for hip aspiration - Dr. Doss reviewed imaging and is requesting MRI be done prior to proceeding with aspiration. Message sent to Dr. Marne.  "

## 2024-03-31 NOTE — Progress Notes (Signed)
 "  Progress Note    Subjective:       Systemic or Specific Complaints: Fever and left hip pain    Objective:     Patient Vitals for the past 24 hrs:   BP Temp Temp src Pulse Resp SpO2 Weight   03/31/24 1129 112/76 98.4 F (36.9 C) Oral 100 14 94 % --   03/31/24 1045 -- 98.4 F (36.9 C) Oral (!) 107 11 98 % --   03/31/24 1030 -- -- -- (!) 109 20 95 % --   03/31/24 1015 -- -- -- (!) 110 19 98 % --   03/31/24 1000 -- -- -- (!) 113 15 93 % --   03/31/24 0945 -- -- -- (!) 111 14 92 % --   03/31/24 0930 -- 99.4 F (37.4 C) Oral (!) 108 19 95 % --   03/31/24 0915 -- -- -- (!) 108 18 95 % --   03/31/24 0900 -- -- -- (!) 112 17 93 % --   03/31/24 0845 -- -- -- (!) 116 18 (!) 89 % --   03/31/24 0831 -- -- -- -- 18 -- --   03/31/24 0830 -- -- -- (!) 115 16 90 % --   03/31/24 0815 -- -- -- (!) 116 21 (!) 87 % --   03/31/24 0801 -- -- -- -- 21 -- --   03/31/24 0800 136/81 -- -- (!) 114 19 93 % --   03/31/24 0752 -- (!) 100.6 F (38.1 C) Oral -- -- -- --   03/31/24 0745 -- -- -- (!) 119 13 -- --   03/31/24 0730 -- -- -- (!) 117 14 -- --   03/31/24 0715 -- -- -- (!) 117 23 -- --   03/31/24 0700 132/75 -- -- -- -- -- --   03/31/24 0600 -- -- -- (!) 111 19 95 % --   03/31/24 0500 -- -- -- (!) 112 18 94 % 82.8 kg (182 lb 8.7 oz)   03/31/24 0400 119/62 98.4 F (36.9 C) Temporal (!) 114 21 (!) 87 % --   03/31/24 0346 119/62 98.4 F (36.9 C) Temporal (!) 112 21 93 % --   03/31/24 0341 111/63 98.3 F (36.8 C) Temporal (!) 112 19 97 % --   03/31/24 0336 125/69 98.2 F (36.8 C) Temporal (!) 119 19 97 % --   03/31/24 0331 133/76 99.3 F (37.4 C) -- (!) 121 17 99 % --   03/31/24 0200 -- -- -- (!) 122 19 -- --   03/31/24 0100 -- -- -- (!) 128 12 -- --   03/31/24 0000 133/76 (!) 102.5 F (39.2 C) Oral (!) 133 24 95 % --   03/30/24 2300 -- -- -- (!) 128 24 -- --   03/30/24 2200 -- -- -- (!) 127 18 -- --   03/30/24 2100 (!) 110/51 -- -- (!) 122 24 -- --   03/30/24 2000 124/67 (!) 102 F (38.9 C) Oral (!) 116 21 95 % --   03/30/24 1900  114/68 -- -- (!) 115 18 -- --   03/30/24 1645 -- 98 F (36.7 C) Oral -- -- -- --   03/30/24 1600 (!) 133/107 -- -- (!) 132 -- 95 % --   03/30/24 1541 -- (!) 101 F (38.3 C) Oral -- -- -- --       General: alert, appears stated age, and cooperative   Wound: No Erythema, No Edema, and tender along groin and posterolateral  left hip   Motion: Painful range of Motion in affected extremity with extremes of IR/ER and hip flexion   DVT Exam: No evidence of DVT seen on physical exam.     Additional exam:     Data Review  CBC:   Lab Results   Component Value Date/Time    WBC 17.1 03/31/2024 01:12 AM    RBC 2.57 03/31/2024 01:12 AM    HGB 7.0 03/31/2024 09:33 AM    HCT 22.5 03/31/2024 09:33 AM    PLT 412 03/31/2024 01:12 AM       Renal:   Lab Results   Component Value Date/Time    NA 135 03/31/2024 09:33 AM    K 3.1 03/31/2024 09:33 AM    CL 97 03/31/2024 09:33 AM    CO2 26 03/31/2024 09:33 AM    BUN 4 03/31/2024 09:33 AM    CREATININE 0.6 03/31/2024 09:33 AM    GLUCOSE 163 03/31/2024 09:33 AM    CALCIUM  7.9 03/31/2024 09:33 AM            Assessment:      left hip pain in setting of bacteremia     Plan:      1:  Given persistent fevers and hip pain I agree with plans for MRI and Radiology-guided left hip aspiration  2:  Discussed with patient who is in agreement  3:  Will follow with regards to any aspiration findings    Shelly GRETTA BIDDING, MD  [facility]  [phone] (cell)  [phone] (appointment scheduling)  "

## 2024-03-31 NOTE — Progress Notes (Signed)
 "Infectious Diseases Inpatient Progress Note    Reason for Consult:   MSSA bacteremia. Appreciate recommendations for abx treatment and duration.  Requesting Physician:   Evern Aid, Sheree  Primary Care Physician:  Alexandro Bernice BRAVO, [name]  History Obtained From:   Pt, EPIC    Admit Date: 03/26/2024  Hospital Day: 6    CHIEF COMPLAINT:       Chief Complaint   Patient presents with    Shortness of Breath     C/o Shortness of breath that started this week     Headache     C/o headaches on and off x 6 months, base of neck up to left side of head     OTHER     Out of MEDS since September        Interval History:  Patient had two reports of fever overnight. 102 and 102.5. She remains tachycardic. Leukocytosis was on the rise yesterday but appears to be leveling off as of this morning. XR of the hip revealed mild degenerative changes but no soft tissue swelling, fracture, or dislocation. Repeat blood cultures have returned positive for staph aureus. Patient seen lying in bed and states that she is tired but otherwise without complaint.        HISTORY OF PRESENT ILLNESS:    Shelly Richard is a 38 y.o. female with a medical history significant for type 2 diabetes, mixed hyperlipidemia, and hypertension, who presented from home to the ED on 03/27/2024, with headache and shortness of breath. Of note, patient reportedly had not had health insurance coverage since September, and as a result has not been taking any of her medications. On presentation to the ED patient was tachycardic, hypertensive, and had a leukocytosis. Patient was admitted for DKA.     Initial infectious workup was negative. However, over the following days the patient remained tachycardic and became febrile with a Tmax 102.9 and was started on ceftriaxone  secondary to a UA with 3+ bacteria and +LE. Blood cultures sent on 03/27/2024 resulted in MSSA on 12/15 and patient was started on vancomycin .     Today, patient reports being tired but  states that she feels much better than on presentation. She remains tachycardic but afebrile. She denies any systemic or urological signs/symptoms of infection. She denies any recent history of trauma or skin lesions. She is still endorsing left hip pain. Leukocytosis increasing today.         Past Medical History:    Past Medical History:   Diagnosis Date    Acute headache     Allergies     Asthma     Diabetes mellitus (HCC)     Genital herpes     HTN (hypertension)     Hyperlipidemia     Hypothyroid     Urticaria        Past Surgical History:    Past Surgical History:   Procedure Laterality Date    CYST REMOVAL      HYSTEROSCOPY      x2    MYOMECTOMY N/A 02/01/2022    ROBOTIC MYOMECTOMY performed by Foley, Chauncey Sharper, MD at [facility] OR       Current Medications:     insulin  glargine  30 Units SubCUTAneous Nightly    insulin  lispro  0-8 Units SubCUTAneous 4x Daily AC & HS    pantoprazole   40 mg Oral QAM AC    nafcillin   2,000 mg IntraVENous Q4H    levothyroxine   25 mcg Oral Daily    insulin  lispro  7 Units SubCUTAneous TID WC    sodium chloride  flush  5-40 mL IntraVENous 2 times per day    [Held by provider] enoxaparin   40 mg SubCUTAneous Daily       Allergies:  Ace inhibitors, Lisinopril, Peanuts [peanut oil], and Walnut    Social History:    TOBACCO:    None  ETOH:    None  DRUGS:   None  OCCUPATION:   Teacher aid    Patient lives at home.    Family History:   No immunodeficiency    REVIEW OF SYSTEMS:    No fever / chills / sweats.  No weight loss.  No visual change, eye pain, eye discharge.    No oral lesion, sore throat, dysphagia.  Denies cough / sputum.   Denies chest pain, palpitations.  Denies n / v / abd pain.  No diarrhea.  Denies dysuria or change in urinary function.  Denies joint swelling or pain.  No myalgia, arthralgia.  Denies skin changes, itching  Denies focal weakness, sensory change or other neurologic symptom    Denies new / worse depression, psychiatric symptoms    PHYSICAL EXAM:      Vitals:     BP 119/62   Pulse (!) 114   Temp 98.4 F (36.9 C) (Temporal)   Resp 21   Ht 1.626 m (5' 4)   Wt 82.8 kg (182 lb 8.7 oz)   LMP 03/23/2024   SpO2 (!) 87%   BMI 31.33 kg/m     GENERAL: No apparent distress.    HEENT: Membranes moist, no oral lesion  NECK:  Supple, no lymphadenopathy  LUNGS: Clear b/l, no rales, no dullness  CARDIAC: RRR, no murmur appreciated  ABD:  + BS, soft / NT  EXT:  No rash, no edema, no lesions. Left hip tender with passive ROM.  NEURO: No focal neurologic findings  PSYCH: Orientation, sensorium, mood normal  LINES:  Peripheral iv    DATA:    Lab Results   Component Value Date    WBC 17.1 (H) 03/31/2024    HGB 6.6 (LL) 03/31/2024    HCT 19.9 (LL) 03/31/2024    MCV 77.6 (L) 03/31/2024    PLT 412 03/31/2024     Lab Results   Component Value Date    CREATININE 0.6 03/29/2024    CREATININE 0.6 03/29/2024    BUN 5 (L) 03/29/2024    BUN 5 (L) 03/29/2024    NA 132 (L) 03/29/2024    NA 133 (L) 03/29/2024    K 4.0 03/29/2024    K 4.2 03/29/2024    CL 100 03/29/2024    CL 100 03/29/2024    CO2 20 (L) 03/29/2024    CO2 18 (L) 03/29/2024       Hepatic Function Panel:   Lab Results   Component Value Date/Time    ALKPHOS 154 11/25/2023 02:44 PM    ALT 16 11/25/2023 02:44 PM    AST 15 11/25/2023 02:44 PM    BILITOT 0.7 11/25/2023 02:44 PM    BILIDIR <0.2 02/03/2014 06:04 PM    IBILI see below 02/03/2014 06:04 PM       Micro:  Previous Cultures:        Results         Procedure Component Value Units Date/Time     Culture, Urine [[id number]]       Order Status: Sent Specimen: Urine, Clean Catch  Urine Culture [[id number]] Collected: 03/28/24 0154     Order Status: Sent Specimen: Urine voided Updated: 03/28/24 1113     Culture, Blood 1 [[id number]] Collected: 03/27/24 2342     Order Status: Completed Specimen: Blood Updated: 03/29/24 0115       Blood Culture, Routine No Growth to date.  Any change in status will be called.     Narrative:       ORDER#: [id number]                          ORDERED  BY: GENSIC, ANNA  SOURCE: Blood                              COLLECTED:  03/27/24 23:42  ANTIBIOTICS AT COLL.:                      RECEIVED :  03/28/24 19:23  If child <=2 yrs old please draw pediatric bottle.~Blood Culture 1     Culture, Blood 2 [[id number]] Collected: 03/27/24 2342     Order Status: Completed Specimen: Blood Updated: 03/29/24 0115       Culture, Blood 2 No Growth to date.  Any change in status will be called.     Narrative:       ORDER#: [id number]                          ORDERED BY: GENSIC, ANNA  SOURCE: Blood                              COLLECTED:  03/27/24 23:42  ANTIBIOTICS AT COLL.:                      RECEIVED :  03/28/24 19:23  If child <=2 yrs old please draw pediatric bottle.~Blood Culture #2     COVID-19 & Influenza Combo [[id number]] Collected: 03/27/24 1607     Order Status: Completed Specimen: Nasopharyngeal Swab Updated: 03/27/24 1741       SARS-CoV-2 RNA, RT PCR NOT DETECTED       Comment: Not Detected results do not preclude SARS-CoV-2 infection and  should not be used as the sole basis for patient management  decisions.  Results must be combined with clinical observations,  patient history, and epidemiological information.     Testing was performed using [employer] SARS-CoV-2 and Influenza A/B  nucleic acid assay. This test is a multiplex Real-Time Reverse  Transcriptase Polymerase Chain Reaction (RT-PCR)-based in vitro  diagnostic test intended for the qualitative detection of nucleic  acids from SARS-CoV-2, influenza A, and influenza B in nasopharyngeal  and nasal swab specimens.     Patient Fact Sheet:  salonclasses.at  Provider Fact Sheet: coursepreviews.dk  EUA: forexcoupons.[email or web address]  IFU: webcrashers.at     Methodology:  RT-PCR             Influenza A NOT DETECTED       Influenza B NOT DETECTED           Susceptibility (03/31/24)    Staphylococcus aureus    Antibiotic  Interpretation MIC  Method Status    clindamycin  Sensitive <=0.25 mcg/mL BACTERIAL SUSCEPTIBILITY PANEL BY MIC     erythromycin  Sensitive <=0.25 mcg/mL BACTERIAL SUSCEPTIBILITY PANEL BY MIC     levofloxacin Resistant 4  mcg/mL BACTERIAL SUSCEPTIBILITY PANEL BY MIC     linezolid Sensitive 2 mcg/mL BACTERIAL SUSCEPTIBILITY PANEL BY MIC     oxacillin Sensitive 0.5 mcg/mL BACTERIAL SUSCEPTIBILITY PANEL BY MIC     tetracycline Sensitive <=1 mcg/mL BACTERIAL SUSCEPTIBILITY PANEL BY MIC      Tetracyclines have decreased renal elimination and  urinary bladder concentrations.  Use of this class of  agents for urinary tract infection should be limited to  patients that cannot tolerate an alternative therapy.       trimethoprim-sulfamethoxazole Sensitive <=10 mcg/mL BACTERIAL SUSCEPTIBILITY PANEL BY MIC     vancomycin  Sensitive 1 mcg/mL BACTERIAL SUSCEPTIBILITY PANEL BY MIC           IMPRESSION:      Patient Active Problem List   Diagnosis    Mixed hyperlipidemia    Iron  deficiency anemia    Type 2 diabetes mellitus with hyperglycemia, with long-term current use of insulin  (HCC)    Essential hypertension    Acquired hypothyroidism    Mild intermittent asthma without complication    Allergic rhinitis    Heart murmur    Uterine fibroid    Class 1 obesity due to excess calories with serious comorbidity and body mass index (BMI) of 31.0 to 31.9 in adult    Abnormal uterine bleeding    Eczema    Glaucoma suspect, bilateral    Hidradenitis axillaris    HSV-2 infection    Myopic astigmatism, bilateral    Strabismus    S/P myomectomy    DKA, type 2, not at goal [facility])    Acute nonintractable headache    MSSA bacteremia    Diabetic ketoacidosis without coma associated with type 2 diabetes mellitus (HCC)    Left hip pain         RECOMMENDATIONS:  Shelly Richard is a 38 y.o. female with a medical history significant for type 2 diabetes, mixed hyperlipidemia, and hypertension, who presented from home to the ED on 03/27/2024, with  headache and shortness of breath and being managed for DKA and bacteremia.    Sepsis  MSSA bacteremia  Etiology currently unknown  Patient presented in DKA secondary to medicaiton non compliance and repeatedly developed episodes of fever. She met sepsis criteria as she was tachycardic and tachypnic with leukocytosis. She was started on ceftriaxone  12/14 for concerns of UTI and started on vancomycin  thereafter on 12/15 when blood cultures resulted with MSSA. Patient reporting left hip pain but denies recent trauma or skin break down, surgery or skin instrumentation, or dental work. She denies IVDU.   - TTE with no significant valvular abnormality or vegetation seen 12/16  - Vancomycin  deescalated to nafcillin  2 g IV every 4 hours on 12/15  - continue nafcillin    - PICC for IV antibiotics at discharge placed  - Repeat blood cultures positive for staph aureus (12/17)  - Orthopedic consulted to evaluate left hip pain. They recommended PT and would reevaluate if clinical picture worsen  - Recommend synovial aspirate of the left hip      DKA - resolved  - has been transitioned from insulin  drip to lantus  and lispro and is tolerating PO intake    Chronic medical conditions:  Hypothyroidism - home levothyroxine   Hypertension - home losartan   Hyperlipidemia -  home simvastatin       Medical Decision Making:  The following items were considered in medical decision making:  Discussion of patient care with other providers  Reviewed clinical lab tests  Reviewed  radiology tests  Reviewed other diagnostic tests/interventions  Independent review of radiologic images  Microbiology cultures and other micro tests reviewed      Risk of Complications/Morbidity: High   Illness(es)/ Infection present that pose threat to bodily function.   There is potential for severe exacerbation of infection/side effects of treatment.  Therapy requires intensive monitoring for antimicrobial agent toxicity    Discussed with Dr. He.    Dorn Ovens,  MD  Internal Medicine, PGY-3  "

## 2024-04-01 ENCOUNTER — Inpatient Hospital Stay: Admit: 2024-04-01 | Payer: Medicaid (Managed Care) | Primary: Oncology

## 2024-04-01 LAB — BASIC METABOLIC PANEL W/ REFLEX TO MG FOR LOW K
Anion Gap: 11 (ref 3–16)
BUN: 5 mg/dL — ABNORMAL LOW (ref 7–20)
CO2: 28 mmol/L (ref 21–32)
Calcium: 8.3 mg/dL (ref 8.3–10.6)
Chloride: 96 mmol/L — ABNORMAL LOW (ref 99–110)
Creatinine: 0.6 mg/dL (ref 0.6–1.1)
Est, Glom Filt Rate: >90
Glucose: 228 mg/dL — ABNORMAL HIGH (ref 70–99)
Potassium reflex Magnesium: 3 mmol/L — ABNORMAL LOW (ref 3.5–5.1)
Sodium: 135 mmol/L — ABNORMAL LOW (ref 136–145)

## 2024-04-01 LAB — METANEPHRINES PLASMA FREE
Metanephrine, Free: <0.1 nmol/L (ref 0.00–0.49)
Normetanephrine, Free: 2.55 nmol/L — ABNORMAL HIGH (ref 0.00–0.89)

## 2024-04-01 LAB — CBC WITH AUTO DIFFERENTIAL
Basophils %: 0.2 %
Basophils Absolute: 0 K/uL (ref 0.0–0.2)
Eosinophils %: 0.9 %
Eosinophils Absolute: 0.1 K/uL (ref 0.0–0.6)
Hematocrit: 22.5 % — ABNORMAL LOW (ref 36.0–48.0)
Hemoglobin: 7.5 g/dL — ABNORMAL LOW (ref 12.0–16.0)
Lymphocytes %: 11.4 %
Lymphocytes Absolute: 1.8 K/uL (ref 1.0–5.1)
MCH: 26.2 pg (ref 26.0–34.0)
MCHC: 33.2 g/dL (ref 31.0–36.0)
MCV: 78.9 fL — ABNORMAL LOW (ref 80.0–100.0)
MPV: 8.5 fL (ref 5.0–10.5)
Monocytes %: 7.4 %
Monocytes Absolute: 1.2 K/uL (ref 0.0–1.3)
Neutrophils %: 80.1 %
Neutrophils Absolute: 13 K/uL — ABNORMAL HIGH (ref 1.7–7.7)
Platelets: 504 K/uL — ABNORMAL HIGH (ref 135–450)
RBC: 2.85 M/uL — ABNORMAL LOW (ref 4.00–5.20)
RDW: 16.4 % — ABNORMAL HIGH (ref 12.4–15.4)
WBC: 16.2 K/uL — ABNORMAL HIGH (ref 4.0–11.0)

## 2024-04-01 LAB — POCT GLUCOSE
POC Glucose: 162 mg/dL — ABNORMAL HIGH (ref 70–99)
POC Glucose: 239 mg/dL — ABNORMAL HIGH (ref 70–99)
POC Glucose: 230 mg/dL — ABNORMAL HIGH (ref 70–99)
POC Glucose: 228 mg/dL — ABNORMAL HIGH (ref 70–99)
POC Glucose: 129 mg/dL — ABNORMAL HIGH (ref 70–99)

## 2024-04-01 LAB — CULTURE, BLOOD 1: Blood Culture, Routine: POSITIVE

## 2024-04-01 LAB — CK: Total CK: 90 U/L (ref 26–192)

## 2024-04-01 LAB — MAGNESIUM: Magnesium: 2.03 mg/dL (ref 1.80–2.40)

## 2024-04-01 MED ORDER — INSULIN GLARGINE 100 UNIT/ML SC SOLN
100 | Freq: Every evening | SUBCUTANEOUS | Status: DC
Start: 2024-04-01 — End: 2024-04-03
  Administered 2024-04-02 – 2024-04-03 (×2): 15 [IU] via SUBCUTANEOUS

## 2024-04-01 MED ORDER — FENTANYL CITRATE (PF) 100 MCG/2ML IJ SOLN
100 | INTRAMUSCULAR | Status: AC
Start: 2024-04-01 — End: 2024-04-01

## 2024-04-01 MED ORDER — MIDAZOLAM HCL 2 MG/2ML IJ SOLN
2 | INTRAMUSCULAR | Status: AC
Start: 2024-04-01 — End: 2024-04-01

## 2024-04-01 MED ORDER — DAPTOMYCIN 500 MG IV SOLR
500 | Freq: Once | INTRAVENOUS | Status: AC
Start: 2024-04-01 — End: 2024-04-01
  Administered 2024-04-01: 20:00:00 400 mg via INTRAVENOUS

## 2024-04-01 MED ORDER — POTASSIUM CHLORIDE 10 MEQ/100ML IV SOLN
10 | INTRAVENOUS | Status: DC | PRN
Start: 2024-04-01 — End: 2024-04-07
  Administered 2024-04-01 (×6): 10 meq via INTRAVENOUS

## 2024-04-01 MED ORDER — FENTANYL CITRATE (PF) 100 MCG/2ML IJ SOLN
100 | INTRAMUSCULAR | Status: DC | PRN
Start: 2024-04-01 — End: 2024-04-02
  Administered 2024-04-01: 14:00:00 50 via INTRAVENOUS

## 2024-04-01 MED ORDER — SODIUM CHLORIDE 0.9 % IV SOLN
0.9 | INTRAVENOUS | Status: DC | PRN
Start: 2024-04-01 — End: 2024-04-07

## 2024-04-01 MED ORDER — POTASSIUM BICARB-CITRIC ACID 20 MEQ PO TBEF
20 | ORAL | Status: DC | PRN
Start: 2024-04-01 — End: 2024-04-07

## 2024-04-01 MED ORDER — MIDAZOLAM HCL 2 MG/2ML IJ SOLN
2 | INTRAMUSCULAR | Status: DC | PRN
Start: 2024-04-01 — End: 2024-04-02
  Administered 2024-04-01: 14:00:00 1 via INTRAVENOUS

## 2024-04-01 MED ORDER — LIDOCAINE HCL 1% INJ (MIXTURES ONLY)
1 | INTRAMUSCULAR | Status: AC
Start: 2024-04-01 — End: 2024-04-01

## 2024-04-01 MED ORDER — HYDROMORPHONE HCL PF 1 MG/ML IJ SOLN
1 | INTRAMUSCULAR | Status: DC | PRN
Start: 2024-04-01 — End: 2024-04-02
  Administered 2024-04-01 – 2024-04-02 (×6): 0.25 mg via INTRAVENOUS

## 2024-04-01 MED ORDER — BUPIVACAINE HCL (PF) 0.5 % IJ SOLN
0.5 | INTRAMUSCULAR | Status: AC
Start: 2024-04-01 — End: 2024-04-01

## 2024-04-01 MED ORDER — LIDOCAINE HCL (PF) 1 % IJ SOLN
1 | Freq: Once | INTRAMUSCULAR | Status: DC
Start: 2024-04-01 — End: 2024-04-02

## 2024-04-01 MED ORDER — NORMAL SALINE FLUSH 0.9 % IV SOLN
0.9 | INTRAVENOUS | Status: DC | PRN
Start: 2024-04-01 — End: 2024-04-02
  Administered 2024-04-01: 14:00:00 10 via INTRAVENOUS

## 2024-04-01 MED ORDER — NORMAL SALINE FLUSH 0.9 % IV SOLN
0.9 | Freq: Two times a day (BID) | INTRAVENOUS | Status: DC
Start: 2024-04-01 — End: 2024-04-07
  Administered 2024-04-02 – 2024-04-07 (×7): 10 mL via INTRAVENOUS

## 2024-04-01 MED ORDER — LIDOCAINE HCL (PF) 1 % IJ SOLN
1 | INTRAMUSCULAR | Status: DC | PRN
Start: 2024-04-01 — End: 2024-04-02
  Administered 2024-04-01: 14:00:00 15 via INTRADERMAL

## 2024-04-01 MED ORDER — NORMAL SALINE FLUSH 0.9 % IV SOLN
0.9 | INTRAVENOUS | Status: DC | PRN
Start: 2024-04-01 — End: 2024-04-07

## 2024-04-01 MED ORDER — POTASSIUM CHLORIDE CRYS ER 20 MEQ PO TBCR
20 | ORAL | Status: DC | PRN
Start: 2024-04-01 — End: 2024-04-07
  Administered 2024-04-02: 14:00:00 40 meq via ORAL

## 2024-04-01 MED FILL — NAFCILLIN SODIUM 2 G IJ SOLR: 2 g | INTRAMUSCULAR | Qty: 2000 | Fill #0

## 2024-04-01 MED FILL — MIDAZOLAM HCL 2 MG/2ML IJ SOLN: 2 mg/mL | INTRAMUSCULAR | Qty: 2 | Fill #0

## 2024-04-01 MED FILL — INSULIN LISPRO 100 UNIT/ML IJ SOLN: 100 [IU]/mL | INTRAMUSCULAR | Qty: 2 | Fill #0

## 2024-04-01 MED FILL — FENTANYL CITRATE (PF) 100 MCG/2ML IJ SOLN: 100 MCG/2ML | INTRAMUSCULAR | Qty: 2 | Fill #0

## 2024-04-01 MED FILL — PERCOCET 5-325 MG PO TABS: 5-325 mg | ORAL | Qty: 1 | Fill #0

## 2024-04-01 MED FILL — INSULIN LISPRO 100 UNIT/ML IJ SOLN: 100 [IU]/mL | INTRAMUSCULAR | Qty: 7 | Fill #0

## 2024-04-01 MED FILL — PANTOPRAZOLE SODIUM 40 MG PO TBEC: 40 mg | ORAL | Qty: 1 | Fill #0

## 2024-04-01 MED FILL — LANTUS 100 UNIT/ML SC SOLN: 100 [IU]/mL | SUBCUTANEOUS | Qty: 30 | Fill #0

## 2024-04-01 MED FILL — POTASSIUM CHLORIDE 10 MEQ/100ML IV SOLN: 10 MEQ/0ML | INTRAVENOUS | Qty: 100 | Fill #0

## 2024-04-01 MED FILL — LEVOTHYROXINE SODIUM 25 MCG PO TABS: 25 ug | ORAL | Qty: 1 | Fill #0

## 2024-04-01 MED FILL — MARCAINE PRESERVATIVE FREE 0.5 % IJ SOLN: 0.5 % | INTRAMUSCULAR | Qty: 30 | Fill #0

## 2024-04-01 MED FILL — LIDOCAINE HCL (PF) 1 % IJ SOLN: 1 % | INTRAMUSCULAR | Qty: 30 | Fill #0

## 2024-04-01 MED FILL — HYDROMORPHONE HCL 1 MG/ML IJ SOLN: 1 mg/mL | INTRAMUSCULAR | Qty: 1 | Fill #0

## 2024-04-01 MED FILL — DAPTOMYCIN 500 MG IV SOLR: 500 mg | INTRAVENOUS | Qty: 8 | Fill #0

## 2024-04-01 MED FILL — ACETAMINOPHEN 325 MG PO TABS: 325 mg | ORAL | Qty: 2 | Fill #0

## 2024-04-01 NOTE — Progress Notes (Signed)
"    Comprehensive Nutrition Assessment    RECOMMENDATIONS:  PO Diet: Continue Regular, CC3 diet; may adjust CC restriction if po intake does not improve  Nutrition Supplement: Start Ensure Max (low carb, high protein) BID   Nutrition Education: Education/Counseling needed     NUTRITION ASSESSMENT:   Nutritional summary & status: Follow up. Patient continues to endorse decreased appetite however intake has been improved over the past two days. S/p Left hip fluid aspiration r/t sepsis, DKA resolved and insulin  gtt off, +MSSA per EMR review. Weight is up with pt being +14L today, no edema, bowels moving. CC3 restriction in place, if po intake remains inadequate this may be modified to promote intake and RD will order ONS for patient to trial.   Admission // PMH: DKA, type 2, not at goal // T2DM, mixed HLD, HTN, hypothyroidism    MALNUTRITION ASSESSMENT  Context of Malnutrition: Chronic Illness   Malnutrition Status: At risk for malnutrition  Findings of the 6 clinical characteristics of malnutrition (Minimum of 2 out of 6 clinical characteristics is required to make the diagnosis of moderate or severe Protein Calorie Malnutrition based on AND/ASPEN Guidelines):  Energy Intake:  No decrease in energy intake  Weight Loss:  7.5% over 3 months (8% over 4 months)     Body Fat Loss:  No body fat loss     Muscle Mass Loss:  No muscle mass loss    Fluid Accumulation:  No fluid accumulation    Grip Strength:  Not Performed    NUTRITION DIAGNOSIS   Altered nutrition-related lab values related to endocrine dysfunction as evidenced by  (DKA)    Nutrition Monitoring and Evaluation:   Food/Nutrient Intake Outcomes:  Food and Nutrient Intake, Supplement Intake  Physical Signs/Symptoms Outcomes:  Biochemical Data, GI Status, Nausea or Vomiting, Nutrition Focused Physical Findings, Weight     OBJECTIVE DATA: Significant to nutrition assessment  Nutrition Related Findings: +BM 12/16,  Wounds: None  Nutrition Goals: PO intake 50% or  greater, by next RD assessment     CURRENT NUTRITION THERAPIES  ADULT DIET; Regular; 3 carb choices (45 gm/meal); PEANUT AND WALNUT ALLERGY  PO Intake: 26-50%, 51-75%, 76-100% (x3 meals/day over past two days)   PO Supplement Intake:None Ordered  Additional Sources of Calories/IVF:n/a     COMPARATIVE STANDARDS  Energy (kcal):  1518 - 1746 (20 - 23 kcal/kg CBW)     Protein (g):  65 - 87 (1.2 - 1.6 gm/kg IBW)       Fluid (ml/day):  1 ml/kcal    ANTHROPOMETRICS  Current Height: 162.6 cm (5' 4)  Current Weight - Scale: 82.8 kg (182 lb 8.7 oz)    Admission weight: 75.7 kg (166 lb 12.8 oz)    The patient will be monitored per nutrition standards of care. Consult dietitian if additional nutrition interventions are needed prior to RD reassessment.     Lauraine Perla, RD  [place]:  [phone]       "

## 2024-04-01 NOTE — Plan of Care (Signed)
"      Early gram stain showing gram pos cocci and WBCs    Given aspiration findings would suggest surgical I&D of greater trochanteric bursa region tomorrow 12/19    Please keep NPO after MN  I will talk to patient again prior to surgery tomorrow      LAMAR GRETTA BIDDING, MD  [facility]  [phone] (cell)  "

## 2024-04-01 NOTE — Progress Notes (Signed)
"  Patient is A&Ox4. VSS this shift with exception of elevated HR. Patient has endorsed pain to L hip managed well per [facility] and non-pharm measures. Patient is tolerating PO diet. Tolerating ambulation x1 assist with gait belt and walker. Voiding via BRP. Patient updated on plan of care. Fall and safety precautions in place, call light within reach.   "

## 2024-04-01 NOTE — Pre Sedation (Signed)
 "IR  H & P      Patient:  Shelly Richard   DOB:   08/30/1985      Relevant patient history reviewed and discussed.    CC: Left hip fluid collection.  Sepsis in need of fluid aspiration.    The procedure including risks and benefits was discussed at length with the patient (or designated family member) and all questions were answered.  Informed consent to proceed with the procedure was given.      Heart : regular rate and rhythm  Lungs : clear, breathing easily  Airway Assessment: Mallampati 1  Condition : stable    Aldrete Scale:  Activity:  2 - Able to move 4 extremities voluntarily on command  Respiration:  2 - Able to breathe deeply and cough freely  Circulation:  2 - BP+/- of normal  Consciousness:  2 - Fully awake  Oxygen Saturation (color):  2 - Able to maintain oxygen saturation >92% on room air      [facility] nurses notes reviewed and agreed.  Medications reviewed.  No current facility-administered medications on file prior to encounter.     Current Outpatient Medications on File Prior to Encounter   Medication Sig Dispense Refill    Blood Glucose Monitoring Suppl (ACURA BLOOD GLUCOSE METER) w/Device KIT Please provide meter approved by insurance.  Check bs qam 1 kit 0    insulin  detemir (LEVEMIR  FLEXPEN) 100 UNIT/ML injection pen INJECT 20 UNITS UNDER THE SKIN ONCE NIGHTLY (Patient not taking: Reported on 03/26/2024) 6 mL 3    Continuous Glucose Sensor (FREESTYLE LIBRE 2 SENSOR) MISC APPLY SENSOR TO SKIN FOR CONTINUOUS BLOOD SUGAR MONITORING, REPLACE EVERY 14 DAYS (Patient not taking: Reported on 03/27/2024) 6 each 1    insulin  lispro, 1 Unit Dial , (HUMALOG /ADMELOG ) 100 UNIT/ML SOPN INJECT 10 UNITS UNDER THE SKIN WITH MEALS PLUS INJECT 2 UNITS FOR BLOOD SUGAR  150-200, 4 UNITS IF 201-250, 6 UNITS IF 251-300, 8 UNITS IF 301-350, 10 UNITS IF GREATER THAN 350 (Patient not taking: Reported on 03/26/2024) 12 mL 3    Semaglutide , 1 MG/DOSE, (OZEMPIC , 1 MG/DOSE,) 4 MG/3ML SOPN sc injection Inject 1 mg  into the skin every 7 days (Patient not taking: Reported on 03/26/2024) 6 mL 1    Cholecalciferol (VITAMIN D3) 50 MCG (2000 UT) TABS TAKE ONE TABLET BY MOUTH DAILY (Patient not taking: Reported on 03/27/2024) 30 tablet 3    ferrous sulfate  (FEROSUL) 325 (65 Fe) MG tablet Take 1 tablet by mouth daily (with breakfast) (Patient not taking: Reported on 03/27/2024) 30 tablet 2    metFORMIN  (GLUCOPHAGE -XR) 500 MG extended release tablet Take 4 tablets by mouth daily (with breakfast) (Patient not taking: Reported on 03/26/2024) 360 tablet 1    insulin  glargine (LANTUS  SOLOSTAR) 100 UNIT/ML injection pen Inject 40 Units into the skin nightly (Patient not taking: Reported on 03/26/2024) 5 Adjustable Dose Pre-filled Pen Syringe 0    Insulin  Pen Needle (DROPLET PEN NEEDLES) 32G X 6 MM MISC USE ONCE DAILY (Patient not taking: Reported on 03/27/2024) 100 each 3    dapagliflozin  (FARXIGA ) 10 MG tablet Take 1 tablet by mouth every morning (Patient not taking: Reported on 03/26/2024) 90 tablet 1    simvastatin  (ZOCOR ) 40 MG tablet Take 1 tablet by mouth daily (Patient not taking: Reported on 03/26/2024) 90 tablet 1    cetirizine  (ZYRTEC ) 10 MG tablet Take 1 tablet by mouth daily (Patient not taking: Reported on 03/26/2024) 90 tablet 1    levothyroxine  (  SYNTHROID ) 75 MCG tablet TAKE ONE TABLET BY MOUTH DAILY (Patient not taking: Reported on 03/27/2024) 90 tablet 1    montelukast  (SINGULAIR ) 10 MG tablet Take 1 tablet by mouth daily (Patient not taking: Reported on 03/26/2024) 90 tablet 1    furosemide  (LASIX ) 20 MG tablet Take 1 tablet by mouth daily as needed (swelling) (Patient not taking: Reported on 03/27/2024) 60 tablet 3    ibuprofen  (ADVIL ;MOTRIN ) 600 MG tablet Take 1 tablet by mouth every 6 hours as needed for Pain 120 tablet 0    acetaminophen  (TYLENOL ) 500 MG tablet Take 1 tablet by mouth 4 times daily as needed for Pain 120 tablet 0    Continuous Blood Gluc Sensor (FREESTYLE LIBRE 2 SENSOR) MISC USE TO TEST BLOOD GLUCOSE  AS DIRECTED (Patient not taking: Reported on 03/27/2024) 14 each 2    letrozole (FEMARA) 2.5 MG tablet  (Patient not taking: Reported on 03/26/2024)      losartan  (COZAAR ) 25 MG tablet TAKE ONE TABLET BY MOUTH DAILY (Patient not taking: Reported on 03/26/2024) 90 tablet 1    Insulin  Pen Needle 32G X 6 MM MISC Use four times daily and as needed (Patient not taking: Reported on 03/27/2024) 100 each 5    EPINEPHrine (EPIPEN) 0.3 MG/0.3ML SOAJ injection  (Patient not taking: Reported on 03/27/2024)      blood glucose monitor strips Tes2t qam  times a day & as needed for symptoms of irregular blood glucose. Please provide test strips approved by insurance. (Patient not taking: Reported on 03/27/2024) 100 strip 2    albuterol  sulfate HFA 108 (90 Base) MCG/ACT inhaler Inhale 2 puffs into the lungs every 6 hours as needed for Wheezing or Shortness of Breath 1 Inhaler 5    Insulin  Pen Needle ([employer] PEN NEEDLES 31G) 31G X 8 MM MISC 1 each by Does not apply route daily (Patient not taking: Reported on 03/27/2024) 100 each 3    valACYclovir  (VALTREX ) 500 MG tablet TAKE 1 TABLET BY MOUTH DAILY (Patient not taking: Reported on 11/15/2023) 90 tablet 5    Lancets MISC 1 each by Does not apply route daily Please provide lancets approved by insurance. (Patient not taking: Reported on 03/27/2024) 100 each 3     Allergies:   Allergies   Allergen Reactions    Ace Inhibitors Swelling    Lisinopril Other (See Comments)     Swollen lips    Peanuts [Peanut Oil]      THROAT ITCHES  Walnuts  PEANUTS     Walnut      Sedation : Moderate sedation planned  ASA 1 - Normal health patient    "

## 2024-04-01 NOTE — Plan of Care (Signed)
"      Radiology aspiration of left hip greater trochanter fluid collection today noted    Will follow for micro results    If aspiration showing obvious signs of septic bursitis will plan for surgical I&D      LAMAR GRETTA BIDDING, MD  [facility]  [phone] (cell)  "

## 2024-04-01 NOTE — Plan of Care (Signed)
"    Problem: Chronic Conditions and Co-morbidities  Goal: Patient's chronic conditions and co-morbidity symptoms are monitored and maintained or improved  04/01/2024 0714 by Meyer Dockery, RN  Outcome: Progressing  Flowsheets (Taken 04/01/2024 [phone])  Care Plan - Patient's Chronic Conditions and Co-Morbidity Symptoms are Monitored and Maintained or Improved:   Monitor and assess patient's chronic conditions and comorbid symptoms for stability, deterioration, or improvement   Collaborate with multidisciplinary team to address chronic and comorbid conditions and prevent exacerbation or deterioration   Update acute care plan with appropriate goals if chronic or comorbid symptoms are exacerbated and prevent overall improvement and discharge     Problem: Discharge Planning  Goal: Discharge to home or other facility with appropriate resources  04/01/2024 0714 by Christophr Calix, RN  Outcome: Progressing  Flowsheets (Taken 04/01/2024 [phone])  Discharge to home or other facility with appropriate resources:   Identify barriers to discharge with patient and caregiver   Identify discharge learning needs (meds, wound care, etc)   Refer to discharge planning if patient needs post-hospital services based on physician order or complex needs related to functional status, cognitive ability or social support system   Arrange for needed discharge resources and transportation as appropriate     Problem: Safety - Adult  Goal: Free from fall injury  04/01/2024 0714 by Shamra Bradeen, RN  Outcome: Progressing  Flowsheets (Taken 04/01/2024 0714)  Free From Fall Injury:   Instruct family/caregiver on patient safety   Based on caregiver fall risk screen, instruct family/caregiver to ask for assistance with transferring infant if caregiver noted to have fall risk factors     "

## 2024-04-01 NOTE — Discharge Instr - COC (Addendum)
 "Continuity of Care Form    Patient Name: Shelly Richard   DOB: 03/22/1986  MRN: [id number]    Admit date: 03/26/2024  Discharge date: {IJUZ:19854}    Advance Directives: None     Admitting Physician: Shelly KATHEE Loud, MD  PCP: Shelly Bernice FORBES, [name]    Discharging Nurse: [facility]/Room#: 5514/5514-01  Discharging Unit Phone Number: ***    Emergency Contact:   Extended Emergency Contact Information  Primary Emergency Contact: Shelly Richard Phone: [phone]  Relation: Spouse  Preferred language: English  Interpreter needed? No    Past Surgical History:  Past Surgical History:   Procedure Laterality Date    CYST REMOVAL      HYSTEROSCOPY      x2    MYOMECTOMY N/A 02/01/2022    ROBOTIC MYOMECTOMY performed by Shelly Richard, Shelly Sharper, MD at [facility]       Immunization History:   Immunization History   Administered Date(s) Administered    COVID-19, Inactive, PFIZER PURPLE top, DILUTE for use, (age 26 y+) 03/17/2020, 04/10/2020    Hep B, ENGERIX-B, RECOMBIVAX-HB, (age 20y+), IM, 1mL 05/03/2021    Hep B, ENGERIX-B, RECOMBIVAX-HB, (age Birth - 71y), IM, 0.44mL 11/25/1997, 12/24/1997, 08/02/1998    Hepatitis B 12/29/2001, 12/29/2001, 02/29/2004, 02/29/2004, 10/09/2009, 10/09/2009    Influenza, FLUARIX, FLULAVAL, LURLEAN (age 57 mo+) and AFLURIA, (age 56 y+), Quadv PF, 0.87mL 02/25/2017    MMR, PRIORIX, M-M-R II, (age 571m+), SC, 0.80mL 11/25/1997, 03/31/2015    PPD Test 12/06/2008, 12/06/2008, 06/06/2020    Pneumococcal, PCV20, PREVNAR 20, (age 57w+), IM, 0.70mL 11/25/2023    Pneumococcal, PPSV23, PNEUMOVAX 23, (age 2y+), SC/IM, 0.63mL 08/23/2014    TD 5LF, TENIVAC, (age 7y+), IM, 0.33mL 02/29/2004    TDaP, ADACEL (age 5y-64y), MYRTICE (age 10y+), IM, 0.53mL 09/08/2009, 10/09/2009, 11/14/2015    Td vaccine (adult) 11/25/1997, 02/29/2004    Td, unspecified formulation 02/29/2004       Active Problems:  Patient Active Problem List   Diagnosis Code    Mixed hyperlipidemia E78.2    Iron  deficiency anemia  D50.9    Type 2 diabetes mellitus with hyperglycemia, with long-term current use of insulin  (HCC) E11.65, Z79.4    Essential hypertension I10    Acquired hypothyroidism E03.9    Mild intermittent asthma without complication J45.20    Allergic rhinitis J30.9    Heart murmur R01.1    Uterine fibroid D25.9    Class 1 obesity due to excess calories with serious comorbidity and body mass index (BMI) of 31.0 to 31.9 in adult E66.811, E66.09, Z68.31    Abnormal uterine bleeding N93.9    Eczema L30.9    Glaucoma suspect, bilateral H40.003    Hidradenitis axillaris L73.2    HSV-2 infection B00.9    Myopic astigmatism, bilateral H52.203, H52.13    Strabismus H50.9    S/P myomectomy Z98.890    DKA, type 2, not at goal [facility]) E11.10    Acute nonintractable headache R51.9    MSSA bacteremia R78.81, B95.61    Diabetic ketoacidosis without coma associated with type 2 diabetes mellitus (HCC) E11.10    Left hip pain M25.552       Isolation/Infection:   Isolation            No Isolation          Patient Infection Status    No active infections.   Resolved       Infection Onset Added Last Indicated Last Indicated By  Resolved Resolved By    COVID-19 04/22/19 04/23/19 04/22/19 COVID-19 05/06/19 Infection Expired                         Nurse Assessment:  Last Vital Signs: BP 120/81   Pulse (!) 110   Temp 98.8 F (37.1 C) (Oral)   Resp 18   Ht 1.626 m (5' 4)   Wt 82.8 kg (182 lb 8.7 oz)   LMP 03/23/2024   SpO2 100%   BMI 31.33 kg/m     Last documented pain score (0-10 scale): Pain Level: 6  Last Weight:   Wt Readings from Last 1 Encounters:   03/31/24 82.8 kg (182 lb 8.7 oz)     Mental Status: {IP PT MENTAL STATUS:20030}  Safety Concerns:     {MH COC Safety Concerns:304088272}  Impairments/Disabilities:      {MH COC Impairments/Disabilities:304088273}  Nursing Mobility/ADLs:  Walking {CHP DME JIOd:695911956}   Transfer {CHP DME JIOd:695911956}   Bathing {CHP DME JIOd:695911956}   Dressing {CHP DME JIOd:695911956}   Toileting  {CHP DME JIOd:695911956}   Feeding {CHP DME JIOd:695911956}   Med Admin {CHP DME JIOd:695911956}   Med Delivery {MH COC MED Delivery:304088264}   Nutrition Therapy:  Current Nutrition Therapy:   {MH COC Diet List:304088271}  Routes of Feeding: {CHP DME Other Feedings:304088042}  Liquids: {Slp liquid thickness:30034}  Daily Fluid Restriction: {CHP DME Yes amt example:304088041}  Last Modified Barium Swallow with Video (Video Swallowing Test): {Done Not Done Ijuz:695911987}    Elimination:  Continence:   Bowel: {YES / WN:80272}  Bladder: {YES / WN:80272}  Urinary Catheter: {Urinary Catheter:304088013}   Colostomy/Ileostomy/Ileal Conduit: {YES / WN:80272}  Date of Last BM: {DATE:80145}    Treatments at the Time of Hospital Discharge:  Respiratory Treatments: ***  Respiratory Support:  {COC Oxygen Therapy:304810065}  {MH CC Vent Opdu:695911888}  IV Access:  {MH COC IV ACCESS:304088262}  Wound Care Documentation and Therapy:     Additional Wound Care Instructions/Orders: ***  Rehab Therapies: {THERAPEUTIC [phone]}  Weight Bearing Status/Restrictions: {MH CC Weight Bearing:304508812}  Other Medical Equipment (for information only, NOT a DME order):    {EQUIPMENT:304520077}  Other Treatments: ***    Patient's personal belongings on hospital discharge:  Dental Appliances: None  Vision - Corrective Lenses: None  Hearing Aid: None  Clothing: Belt, Footwear, Pajamas, Undergarments, At bedside  Jewelry: Bracelet, Watch, Ring, At bedside  Electronic Devices: [employer], Consulting Civil Engineer, At bedside  Weapons (Insurance Underwriter): None  Home Medications: None  Valuables Given To: Patient  Provide Name(s) of Who Valuable(s) Were Given To: [name]     RN SIGNATURE: {Esignature:304088025}    CASE MANAGEMENT/SOCIAL WORK SECTION    Inpatient Status Date: {DATE:80145}    Readmission Risk Assessment Score:  [facility] RISK OF UNPLANNED READMISSION 2.0             12.3 Total Score        Discharging to Facility/ Agency    Name: ***  Address: ***  Phone: ***  Fax: ***    Dialysis Facility   {COC Dialysis:304810066}    CASE MANAGER/SOCIAL WORKER SIGNATURE: {Esignature:304088025}    PHYSICIAN SECTION  Code Status (should be the appropriate Code Status for the state that the patient will be residing in):  {CODE DUJULD:695189935}    Medical Prognosis: {Prognosis:765 422 2258}    Condition at Discharge: {MH Patient Condition:304088024}    Rehab Potential: {Prognosis:765 422 2258}    Recommended Labs, Imaging Studies, or Other Treatments After Discharge: ***  INFUSION ORDERS:  - Drug: IV daptomycin  400 mg daily  - Planned End date: 04/30/2024  - Diagnosis: MSSA bacteremia, left hip septic bursitis   - Has received test dose in hospital  - Routine line maintenance  - Check CBC w diff, CMP, ESR, CRP, CK  every Mon or Tue - FAX result to [phone]  - Call with antibiotic / infusion issues, [phone]  - Call with any change in status, transfer in or out of a facility or to hospital; This COC is only valid for current disposition - [phone].    - No f/u in outpatient ID office necessary    Physician Certification: I certify the above information and transfer of AVALIE OCONNOR  is necessary for the continuing treatment of the diagnosis listed and that she requires {Admit to Appropriate Level of Care:20763} for a {Short Term vs Long Term Dujb:695189932}.     Update Admission H&P: {CHP DME Changes in YjwiE:695911954}    PHYSICIAN SIGNATURE:  {Esignature:304088025}  "

## 2024-04-01 NOTE — Brief Op Note (Signed)
"  Brief Postoperative Note    Shelly Richard  Date of Birth:  [birth year]  [id number]    Pre-operative Diagnosis: Left hip fluid collection.  Sepsis in need of fluid aspiration    Post-operative Diagnosis: Same    Procedure: CT guided left greater trochanteric fluid aspiration     Anesthesia: moderate    Surgeons/Assistants: Lamar Gravely    Estimated Blood Loss: Minimal    Complications: none    Specimens: were obtained      Lamar Gravely, MD MD  04/01/2024  "

## 2024-04-01 NOTE — Progress Notes (Addendum)
 "    V2.0    [facility] Progress Note      Name:  Shelly Richard DOB/Age/Sex: 1985/04/21  (38 y.o. female)   MRN & CSN:  [id number] & [id number] Encounter Date/Time: 04/01/2024 12:21 PM EST   Location:  5514/5514-01 PCP: Alexandro Bernice FORBES, [name]     Attending:Emelina Hinch, Marne LABOR, MD       Hospital Day: 7  Subjective:     Chief Complaint: Sepsis    Shelly Richard is a 38 y.o. female who presents with sepsis    Subjective:  Patient feels a little better.  Still having hip pain  Spiked a fever of 101.2    P.o. intake very poor, slightly improved from yesterday    Assessment and Recommendations   HPI per admitting provider :      Hospital course:    Shelly Richard is a 38 y.o. female with pmh of diabetes, hypertension, hyperlipidemia who presents with DKA, type 2, not at goal [facility]) with sepsis.  Patient has not been taking her medications due to insurance issues.  DKA was treated with Shelly insulin  drip and now switched to subcutaneous insulin .  Workup revealed MSSA bacteremia      Plan:     DKA resolved    Uncontrolled type 2 diabetes-blood sugars better, we will hold off increasing insulin  as patient's p.o. intake is very poor  - On Lantus  with Premeal insulin   - On sliding scale insulin   - As patient is on insulin  monitor blood sugars hypoglycemia    Sepsis-white count still elevated, still spiking fever off-and-on  - Present on admission  - Still spiking fever    MSSA bacteremia-  - Was initially on vancomycin  switched to nafcillin  on 12/15  - Repeat blood culture pending positive on 12/15, repeat blood culture on 12/16 negative  - TEE normal EF no vegetation      Acute on chronic anemia-posttransfusion hemoglobin stable around 7  -Hemoglobin dropped from 10.9 to 5.7  -Iron  studies shows iron  deficiency but with bacteremia will hold off IV iron   - In the past patient needed iron  transfusions  - In the past patient had a lot of menstrual bleeding and was on Femara for fibroid    Hypothyroidism  -Resume low-dose  Levothyroid    Hip pain-concern for septic bursitis-for aspiration  - CT shows some fluid collection  - MRI done on 12/17 shows a 4.6 cm left greater trochanteric bursal fluid collection which tracks into the gluteus muscle with surrounding edema concern for septic bursitis/abscess            I spent _   minutes in the care of this patient.  Over 50% of that time was in face-to-face counseling regarding disease process, diagnostic testing, preventative measures, and answering patient and family questions.     Diet ADULT DIET; Regular; 3 carb choices (45 gm/meal); PEANUT AND WALNUT ALLERGY   DVT Prophylaxis []  Lovenox , []   Heparin, []  SCDs, []  Ambulation,  []  Eliquis, []  Xarelto  []  Coumadin   Code Status Full Code   Disposition   Unclear     Surrogate Decision Maker/ POA Spouse       MDM  [x]  High (any 2 of A, B, or C)    A. Problems (any 1)  [x]  Acute/Chronic Illness/injury posing threat to life or bodily function:    []  Severe exacerbation of chronic illness:    ---------------------------------------------------------------------  B. Risk of Treatment (any 1)   []   IV ABX requiring serial renal monitoring for nephrotoxicity:     [x]  IV Narcotic analgesia for adverse drug reaction  []  IV diuresis requiring serial monitoring for renal impairment and electrolyte derangements  []  Critical electrolyte abnormalities requiring IV replacement and close serial monitoring  [x]  Insulin  - monitoring serial FSBS for Hypoglycemic adverse drug reaction  []  Anticoagulation requiring serial monitoring of coagulation factors  []  IV/IM Controlled Substances order (any 1)   []  One-time Order including Drug Name/Route, Reason Ordered:   []  Scheduled Order including Drug Name/Route, Reason Ordered or Continued:   []  PRN Order including Drug Name/Route, Reason Ordered or Continued:  [] Other -   []  Decision to De-escalate Care this DOS due to change in treatment goals:  []  Decision to Escalate Care To:   []  Major Surgery/Procedure (any  1):   Elective with patient risk factors including Procedure Type and Risk Factors:   Emergent Procedure Type:    ----------------------------------------------------------------------  C. Data (any 2)  []  Data Review (3+ points)  []  Consultant Note reviewed with note date, specialty, and summary (1 point each)  [x]  All current labs were reviewed and interpreted for clinical significance   []  Studies Reviewed (1 point each):   []  Collateral history obtained including from who and why needed (Max 1 point):  []  Independent (Braxon Suder DELENA Creighton, MD) Interpretation of tests (any 1)  []  Rhythm Strip (Telemetry) personally reviewed and interpreted as documented above    []  Imaging personally reviewed and interpreted, includes:    [x]  Discussion (any 1)  [x]  Discussed the discharge plan in detail with case management including timing/barriers to discharge, need for support services and/or placement decision   []  Discussed management of the case with:         Review of Systems:      Pertinent positives and negatives discussed in HPI    Objective:     Intake/Output Summary (Last 24 hours) at 04/01/2024 0839  Last data filed at 04/01/2024 9374  Gross per 24 hour   Intake 1587.61 ml   Output 0 ml   Net 1587.61 ml      Vitals:   Vitals:    03/31/24 2315 03/31/24 2333 04/01/24 0319 04/01/24 0600   BP: 133/67   129/65   Pulse: (!) 129   (!) 123   Resp: 16 17  17    Temp: (!) 101.2 F (38.4 C)   99.3 F (37.4 C)   TempSrc: Oral  Oral Oral   SpO2: 99%   95%   Weight:       Height:             Physical Exam:      General: NAD  Eyes: EOMI  ENT: neck supple  Cardiovascular: Regular rate.  Respiratory: Clear to auscultation  Gastrointestinal: Soft, non tender  Genitourinary: no suprapubic tenderness  Musculoskeletal: No edema  Skin: warm, dry  Neuro: Alert.  Psych: Mood appropriate.         Medications:   Medications:    insulin  glargine  30 Units SubCUTAneous Nightly    insulin  lispro  0-8 Units SubCUTAneous 4x Daily AC & HS     pantoprazole   40 mg Oral QAM AC    nafcillin   2,000 mg IntraVENous Q4H    levothyroxine   25 mcg Oral Daily    insulin  lispro  7 Units SubCUTAneous TID WC    sodium chloride  flush  5-40 mL IntraVENous 2 times per day    [Held by provider]  enoxaparin   40 mg SubCUTAneous Daily      Infusions:    sodium chloride       sodium chloride       dextrose  5 % and 0.45 % NaCl Stopped (03/28/24 2303)    dextrose        PRN Meds: HYDROmorphone , 0.25 mg, Q4H PRN  oxyCODONE -acetaminophen , 1 tablet, Q4H PRN  sodium chloride , , PRN  dextrose  bolus, 125 mL, PRN   Or  dextrose  bolus, 250 mL, PRN  magnesium  sulfate, 2,000 mg, PRN  sodium phosphate  15 mmol in sodium chloride  0.9 % 250 mL IVPB, 15 mmol, PRN  sodium chloride  flush, 5-40 mL, PRN  sodium chloride , , PRN  magnesium  sulfate, 2,000 mg, PRN  ondansetron , 4 mg, Q8H PRN   Or  ondansetron , 4 mg, Q6H PRN  polyethylene glycol, 17 g, Daily PRN  acetaminophen , 650 mg, Q6H PRN   Or  acetaminophen , 650 mg, Q6H PRN  dextrose  5 % and 0.45 % NaCl, , Continuous PRN  glucose, 4 tablet, PRN  glucagon , 1 mg, PRN  dextrose , , Continuous PRN  albuterol , 2.5 mg, Q6H PRN        Labs and Imaging   CT SOFT TISSUE NECK WO CONTRAST  Result Date: 03/28/2024  DATE: March 28, 2024 EXAM: CT SOFT TISSUE NECK WO CONTRAST INDICATION: neck pain, fever; COMPARISON: None. TECHNIQUE: Axial CT imaging obtained from the skull base through the lung apices. Axial images and multiplanar reformatted images reviewed.   Up-to-date CT equipment and radiation dose reduction techniques were employed. IV Contrast: None FINDINGS: SCOUT: Nondiagnostic scout image(s) obtained for localization purposes, not for diagnostic use DEVICES/HARDWARE: None . ORBITS, PARANASAL SINUSES, SKULL BASE: Limited images of the base of the brain demonstrate no mass effect. Orbits are normal. Imaged sinuses are clear. CERVICAL SPINE/BONES: No destructive process. NASAL CAVITY/NASOPHARYNX: No mass. ORAL CAVITY/OROPHARYNX: No evidence of mass.  HYPOPHARYNX AND LARYNX: Normal. DEEP NECK SPACES: The parapharyngeal, retropharyngeal and masticator spaces are normal. SALIVARY GLANDS: Submandibular glands, sublingual glands and parotid glands are normal. THYROID GLAND: Normal. LYMPH NODES: Nonenlarged cervical lymph nodes bilaterally. THORACIC INLET: No mass or acute abnormality involving the visualized mediastinal structures, trachea or lung apices. OTHER: No significant abnormality.     1. Negative. This dictation was created with voice recognition software.  While attempts have been made to review the dictation as it was transcribed, occasionally the spoken word can be misinterpreted  by the technology, leading to omissions or inappropriate words or phrases.  If questions or concerns arise, please contact the Radiology Department. Electronically signed by Elouise Hugh, MD    CT ABDOMEN PELVIS WO CONTRAST Additional Contrast? None  Result Date: 03/28/2024  DATE: 03/28/2024 EXAM: CT ABDOMEN PELVIS WO CONTRAST INDICATION: hip pain Pain COMPARISON: 2015 TECHNIQUE: Axial CT imaging obtained from lung bases through pelvis. Axial images and multiplanar reformatted images are provided for review.  Up-to-date CT equipment and radiation dose reduction techniques were employed. IV Contrast: None Oral Contrast: No. FINDINGS: SCOUT: Nondiagnostic scout image(s) obtained for localization purposes, not for diagnostic use DEVICES/HARDWARE: None . LUNG BASES: Clear. LIVER: There is decreased in attenuation. Liver is up to 25 cm in craniocaudal dimension. GALLBLADDER AND BILIARY TREE: No calcified gallstones. No gallbladder distention.  No intra- or extrahepatic biliary dilatation. PANCREAS: Normal. SPLEEN: Spleen is up to 14 cm in craniocaudal dimension. ADRENAL GLANDS: Normal. KIDNEYS AND URETERS: No hydronephrosis. Focal areas of striated enhancement of the kidneys bilaterally. Mild perinephric haziness bilaterally without fluid collection. BOWEL: Normal diameter,  nonobstructed.  Appendix is not dilated or thickened. Distal colonic diverticulosis. URINARY BLADDER: Normal. REPRODUCTIVE ORGANS: No associated masses. LYMPH NODES: No abnormally enlarged nodes. PERITONEUM/RETROPERITONEUM: No ascites or free air. VESSELS: Aorta and IVC without significant abnormality.  ABDOMINAL WALL: Mild periarticular edema or swelling of the left hip. BONES: No significant abnormality. OTHER FINDINGS: None.     1. Diverticulosis of the colon without evidence of diverticulitis. 2. Focal areas of striated enhancement of the kidneys, previous CT contrast administration. Findings could indicate renal dysfunction or infection. 3. Mild periarticular edema or swelling of the left hip suggesting muscular injury/strain. 4. Hepatosplenomegaly. This dictation was created with voice recognition software.  While attempts have been made to review the dictation as it was transcribed, occasionally the spoken word can be misinterpreted  by the technology, leading to omissions or inappropriate words or phrases.  If questions or concerns arise, please contact the Radiology Department. Electronically signed by Elouise Hugh, MD    CT HEAD WO CONTRAST  Result Date: 03/28/2024  EXAM: CT HEAD WO CONTRAST STUDY DATE: 03/28/2024 11:15 EST INDICATION: neck pain COMPARISON: None TECHNIQUE: Standard per department protocol. Up-to-date CT equipment and radiation dose reduction techniques were employed. CONTRAST: No intravenous FINDINGS: Brain Parenchyma: No midline shift, mass effect, parenchymal hemorrhage, or evidence of acute territorial infarct. Ventricular System and Extra-Axial Spaces: No extra-axial fluid collections. Basilar cisterns are patent. No hydrocephalus. Extracranial Structures: No significant paranasal sinus disease. No calvarial abnormality. No orbit abnormality.     *  No acute intracranial findings. Electronically signed by Isom Repress    CT CHEST PULMONARY EMBOLISM W CONTRAST  Result Date:  03/27/2024  DATE: 03/27/2024 EXAM: CT CHEST PULMONARY EMBOLISM W CONTRAST INDICATION: c/f PE, Chest pain COMPARISON: None. TECHNIQUE: Axial CT imaging obtained through the chest using CT pulmonary angiogram protocol. Axial images, multiplanar reformatted images and maximum intensity projection images were reviewed.  Up-to-date CT equipment and radiation dose reduction techniques were employed.  IV Contrast Media and volume: 80 cc iodinated contrast FINDINGS: SCOUT: Nondiagnostic scout image(s) obtained for localization purposes, not for diagnostic use DEVICES/HARDWARE: None . DIAGNOSTIC QUALITY: Suboptimal. PULMONARY EMBOLI: None. RIGHT HEART STRAIN: None. LUNGS AND AIRWAYS: Airways are patent.  Lungs are clear. PLEURA: No pleural effusions or significant pleural thickening. HEART / GREAT VESSELS: Heart is not enlarged. ADENOPATHY: None. CHEST WALL / LOWER NECK: No significant abnormality. UPPER ABDOMEN: Normal. BONES: No significant abnormality. OTHER FINDINGS: None.     1. No evidence of pulmonary embolus. This dictation was created with voice recognition software.  While attempts have been made to review the dictation as it was transcribed, occasionally the spoken word can be misinterpreted  by the technology, leading to omissions or inappropriate words or phrases.  If questions or concerns arise, please contact the Radiology Department. Electronically signed by Elouise Hugh, MD    XR CHEST PORTABLE  Result Date: 03/27/2024  Chest HISTORY : Dyspnea VIEWS : 1     Clear lungs. Normal cardiac mediastinal silhouette. Electronically signed by Lynwood Schwalbe, MD      CBC:   Recent Labs     03/30/24  0414 03/30/24  0610 03/30/24  1351 03/31/24  0112 03/31/24  0933   WBC 19.0* 17.7*  --  17.1*  --    HGB 6.6* 5.7* 7.0* 6.6* 7.0*   PLT 418 370  --  412  --      BMP:    Recent Labs     03/29/24  1448 03/31/24  0933  NA 133*  132* 135*   K 4.2  4.0 3.1*   CL 100  100 97*   CO2 18*  20* 26   BUN 5*  5* 4*    CREATININE 0.6  0.6 0.6   GLUCOSE 255*  257* 163*     Hepatic: No results for input(s): AST, ALT, BILITOT, ALKPHOS in the last 72 hours.    Invalid input(s): ALB  Lipids:   Lab Results   Component Value Date/Time    CHOL 154 11/25/2023 02:44 PM    HDL 22 11/25/2023 02:44 PM    TRIG 170 11/25/2023 02:44 PM     Hemoglobin A1C:   Lab Results   Component Value Date/Time    LABA1C 13.9 03/27/2024 03:47 AM     TSH:   Lab Results   Component Value Date/Time    TSH 1.92 08/27/2022 04:35 PM     Troponin: No results found for: TROPONINT  Lactic Acid:   Recent Labs     03/31/24  0113   LACTA 1.3     BNP: No results for input(s): PROBNP in the last 72 hours.  UA:  Lab Results   Component Value Date/Time    NITRU Negative 03/28/2024 02:03 AM    COLORU Yellow 03/28/2024 02:03 AM    PHUR 6.0 03/28/2024 02:03 AM    PHUR 5.0 03/28/2024 02:03 AM    PHUR 5.5 02/25/2017 12:22 PM    WBCUA 3-5 03/28/2024 02:03 AM    RBCUA 5-10 03/28/2024 02:03 AM    BACTERIA 3+ 03/28/2024 02:03 AM    CLARITYU Clear 03/28/2024 02:03 AM    LEUKOCYTESUR TRACE 03/28/2024 02:03 AM    UROBILINOGEN 0.2 03/28/2024 02:03 AM    BILIRUBINUR Negative 03/28/2024 02:03 AM    BLOODU SMALL 03/28/2024 02:03 AM    GLUCOSEU Negative 03/28/2024 02:03 AM    KETUA Negative 03/28/2024 02:03 AM     Urine Cultures:   Lab Results   Component Value Date/Time    LABURIN 25,000 CFU/ml  PBP2= Negative   03/28/2024 01:54 AM     Blood Cultures:   Lab Results   Component Value Date/Time    [facility]  03/29/2024 02:48 PM     Gram stain Pediatric bottle:  Gram positive cocci in clusters  resembling Staphylococcus  Information to follow       Lab Results   Component Value Date/Time    BLOODCULT2  03/30/2024 04:21 PM     No Growth to date.  Any change in status will be called.     Organism:   Lab Results   Component Value Date/Time    ORG Staphylococcus aureus 03/28/2024 01:54 AM         Electronically signed by Marne DELENA Creighton, MD on 04/01/2024 at 8:39 AM  Comment: Please note  this report has been produced using speech recognition software and may contain errors related to that system including errors in grammar, punctuation, and spelling, as well as words and phrases that may be inappropriate. If there are any questions or concerns, please feel free to contact the dictating provider for clarification.   "

## 2024-04-01 NOTE — Sedation Documentation (Signed)
"     [facility] NURSING PROGRESS NOTE    Procedure:  Left Hip Aspiration  April 01, 2024  [name]      Allergies:    Allergies   Allergen Reactions    Ace Inhibitors Swelling    Lisinopril Other (See Comments)     Swollen lips    Peanuts [Peanut Oil]      THROAT ITCHES  Walnuts  PEANUTS     Walnut        Vitals:    04/01/24 0910   BP: 112/73   Pulse: (!) 116   Resp: 18   Temp:    SpO2: 100%       Recent lab work reviewed with MD: yes    Procedure explained to patient by MD: yes   Informed consent obtained:yes  Family with patient:no    Mental Status:  Normal  Readiness to learn:  Yes  Barriers to learning: No    Pain Assessment Pre-Procedure:  Pain Present:  yes: left hip  Pain Score:  8  Pain Quality/Description:  Aching    Time out Procedure Verification with:  [x]  RN  [x]  Physician  [x]  Patient  [x]  Other: CT Technologist  Procedure site marked, if applicable:  Yes    Note: Patient arrived A & O x 4, denies pain, breathing easily on room air, Spoke to Dr. Mannie prior to procedure.  Procedural sedation:  Fentanyl :  50mcg  Versed :    1mg   Post Procedureal Note:  Patient tolerated procedure well. Dr. Mannie aspirated of tan purulent fluid - sent to lab.  Breathing easily on room air.  Report given to 5 tower RN.  Patient transported in stable conditon to room 5514.    Pain Assessment Post-Procedure:  Pain Present:  yes: left hip  Pain Score:  7  Pain Quality/Description:  Aching    Plan of Care Goals:  Safety measures met:  Yes  Patient understands explanation of procedure:  Yes    Time in:  0905  Time out:  9074  Bascom Rater, RN.  R.N. 04/01/2024                "

## 2024-04-01 NOTE — Progress Notes (Signed)
 "Infectious Diseases Inpatient Progress Note    Reason for Consult:   MSSA bacteremia. Appreciate recommendations for abx treatment and duration.  Requesting Physician:   Evern Aid, Sheree  Primary Care Physician:  Alexandro Bernice BRAVO, [name]  History Obtained From:   Pt, EPIC    Admit Date: 03/26/2024  Hospital Day: 7    CHIEF COMPLAINT:       Chief Complaint   Patient presents with    Shortness of Breath     C/o Shortness of breath that started this week     Headache     C/o headaches on and off x 6 months, base of neck up to left side of head     OTHER     Out of MEDS since September        Interval History:  MRI of left hip worrisome for a septic bursitis-abscess. Patient had it aspirated today with removal of tan purulent fluid. She remains mildly tachycardic and spiked a fever overnight of 101.2. Awaiting morning CBC. Patient states that she feels about the same.        HISTORY OF PRESENT ILLNESS:    [name] is a 38 y.o. female with a medical history significant for type 2 diabetes, mixed hyperlipidemia, and hypertension, who presented from home to the ED on 03/27/2024, with headache and shortness of breath. Of note, patient reportedly had not had health insurance coverage since September, and as a result has not been taking any of her medications. On presentation to the ED patient was tachycardic, hypertensive, and had a leukocytosis. Patient was admitted for DKA.     Initial infectious workup was negative. However, over the following days the patient remained tachycardic and became febrile with a Tmax 102.9 and was started on ceftriaxone  secondary to a UA with 3+ bacteria and +LE. Blood cultures sent on 03/27/2024 resulted in MSSA on 12/15 and patient was started on vancomycin .     Today, patient reports being tired but states that she feels much better than on presentation. She remains tachycardic but afebrile. She denies any systemic or urological signs/symptoms of infection. She  denies any recent history of trauma or skin lesions. She is still endorsing left hip pain. Leukocytosis increasing today.         Past Medical History:    Past Medical History:   Diagnosis Date    Acute headache     Allergies     Asthma     Diabetes mellitus (HCC)     Genital herpes     HTN (hypertension)     Hyperlipidemia     Hypothyroid     Urticaria        Past Surgical History:    Past Surgical History:   Procedure Laterality Date    CYST REMOVAL      HYSTEROSCOPY      x2    MYOMECTOMY N/A 02/01/2022    ROBOTIC MYOMECTOMY performed by Foley, Chauncey Sharper, MD at [facility] OR       Current Medications:     insulin  glargine  30 Units SubCUTAneous Nightly    insulin  lispro  0-8 Units SubCUTAneous 4x Daily AC & HS    pantoprazole   40 mg Oral QAM AC    nafcillin   2,000 mg IntraVENous Q4H    levothyroxine   25 mcg Oral Daily    insulin  lispro  7 Units SubCUTAneous TID WC    sodium chloride  flush  5-40 mL IntraVENous 2  times per day    [Held by provider] enoxaparin   40 mg SubCUTAneous Daily       Allergies:  Ace inhibitors, Lisinopril, Peanuts [peanut oil], and Walnut    Social History:    TOBACCO:    None  ETOH:    None  DRUGS:   None  OCCUPATION:   Teacher aid    Patient lives at home.    Family History:   No immunodeficiency    REVIEW OF SYSTEMS:    No fever / chills / sweats.  No weight loss.  No visual change, eye pain, eye discharge.    No oral lesion, sore throat, dysphagia.  Denies cough / sputum.   Denies chest pain, palpitations.  Denies n / v / abd pain.  No diarrhea.  Denies dysuria or change in urinary function.  Denies joint swelling or pain.  No myalgia, arthralgia.  Denies skin changes, itching  Denies focal weakness, sensory change or other neurologic symptom    Denies new / worse depression, psychiatric symptoms    PHYSICAL EXAM:      Vitals:    BP 120/81   Pulse (!) 110   Temp 98.8 F (37.1 C) (Oral)   Resp 18   Ht 1.626 m (5' 4)   Wt 82.8 kg (182 lb 8.7 oz)   LMP 03/23/2024   SpO2 100%   BMI  31.33 kg/m     GENERAL: No apparent distress.    HEENT: Membranes moist, no oral lesion  NECK:  Supple, no lymphadenopathy  LUNGS: Clear b/l, no rales, no dullness  CARDIAC: RRR, no murmur appreciated  ABD:  + BS, soft / NT  EXT:  No rash, no edema, no lesions. Left hip tender with passive ROM.  NEURO: No focal neurologic findings  PSYCH: Orientation, sensorium, mood normal  LINES:  Peripheral iv    DATA:    Lab Results   Component Value Date    WBC 17.1 (H) 03/31/2024    HGB 7.0 (L) 03/31/2024    HCT 22.5 (L) 03/31/2024    MCV 77.6 (L) 03/31/2024    PLT 412 03/31/2024     Lab Results   Component Value Date    CREATININE 0.6 03/31/2024    BUN 4 (L) 03/31/2024    NA 135 (L) 03/31/2024    K 3.1 (L) 03/31/2024    CL 97 (L) 03/31/2024    CO2 26 03/31/2024       Hepatic Function Panel:   Lab Results   Component Value Date/Time    ALKPHOS 154 11/25/2023 02:44 PM    ALT 16 11/25/2023 02:44 PM    AST 15 11/25/2023 02:44 PM    BILITOT 0.7 11/25/2023 02:44 PM    BILIDIR <0.2 02/03/2014 06:04 PM    IBILI see below 02/03/2014 06:04 PM       Micro:  Previous Cultures:        Results         Procedure Component Value Units Date/Time     Culture, Urine [[id number]]       Order Status: Sent Specimen: Urine, Clean Catch       Urine Culture [[id number]] Collected: 03/28/24 0154     Order Status: Sent Specimen: Urine voided Updated: 03/28/24 1113     Culture, Blood 1 [[id number]] Collected: 03/27/24 2342     Order Status: Completed Specimen: Blood Updated: 03/29/24 0115       Blood Culture, Routine No Growth to date.  Any change in  status will be called.     Narrative:       ORDER#: [id number]                          ORDERED BY: GENSIC, ANNA  SOURCE: Blood                              COLLECTED:  03/27/24 23:42  ANTIBIOTICS AT COLL.:                      RECEIVED :  03/28/24 19:23  If child <=2 yrs old please draw pediatric bottle.~Blood Culture 1     Culture, Blood 2 [[id number]] Collected: 03/27/24 2342     Order Status: Completed  Specimen: Blood Updated: 03/29/24 0115       Culture, Blood 2 No Growth to date.  Any change in status will be called.     Narrative:       ORDER#: [id number]                          ORDERED BY: GENSIC, ANNA  SOURCE: Blood                              COLLECTED:  03/27/24 23:42  ANTIBIOTICS AT COLL.:                      RECEIVED :  03/28/24 19:23  If child <=2 yrs old please draw pediatric bottle.~Blood Culture #2     COVID-19 & Influenza Combo [[id number]] Collected: 03/27/24 1607     Order Status: Completed Specimen: Nasopharyngeal Swab Updated: 03/27/24 1741       SARS-CoV-2 RNA, RT PCR NOT DETECTED       Comment: Not Detected results do not preclude SARS-CoV-2 infection and  should not be used as the sole basis for patient management  decisions.  Results must be combined with clinical observations,  patient history, and epidemiological information.     Testing was performed using [employer] SARS-CoV-2 and Influenza A/B  nucleic acid assay. This test is a multiplex Real-Time Reverse  Transcriptase Polymerase Chain Reaction (RT-PCR)-based in vitro  diagnostic test intended for the qualitative detection of nucleic  acids from SARS-CoV-2, influenza A, and influenza B in nasopharyngeal  and nasal swab specimens.     Patient Fact Sheet:  salonclasses.at  Provider Fact Sheet: coursepreviews.dk  EUA: [email or web address]  IFU: [email or web address]     Methodology:  RT-PCR             Influenza A NOT DETECTED       Influenza B NOT DETECTED           Susceptibility (03/31/24)    Staphylococcus aureus    Antibiotic Interpretation MIC  Method Status    clindamycin  Sensitive <=0.25 mcg/mL BACTERIAL SUSCEPTIBILITY PANEL BY MIC     erythromycin  Sensitive <=0.25 mcg/mL BACTERIAL SUSCEPTIBILITY PANEL BY MIC     levofloxacin Resistant 4 mcg/mL BACTERIAL SUSCEPTIBILITY PANEL BY MIC     linezolid Sensitive 2 mcg/mL BACTERIAL SUSCEPTIBILITY  PANEL BY MIC     oxacillin Sensitive 0.5 mcg/mL BACTERIAL SUSCEPTIBILITY PANEL BY MIC     tetracycline Sensitive <=1 mcg/mL BACTERIAL SUSCEPTIBILITY PANEL BY MIC      Tetracyclines have decreased renal elimination and  urinary bladder  concentrations.  Use of this class of  agents for urinary tract infection should be limited to  patients that cannot tolerate an alternative therapy.       trimethoprim-sulfamethoxazole Sensitive <=10 mcg/mL BACTERIAL SUSCEPTIBILITY PANEL BY MIC     vancomycin  Sensitive 1 mcg/mL BACTERIAL SUSCEPTIBILITY PANEL BY MIC           IMPRESSION:      Patient Active Problem List   Diagnosis    Mixed hyperlipidemia    Iron  deficiency anemia    Type 2 diabetes mellitus with hyperglycemia, with long-term current use of insulin  (HCC)    Essential hypertension    Acquired hypothyroidism    Mild intermittent asthma without complication    Allergic rhinitis    Heart murmur    Uterine fibroid    Class 1 obesity due to excess calories with serious comorbidity and body mass index (BMI) of 31.0 to 31.9 in adult    Abnormal uterine bleeding    Eczema    Glaucoma suspect, bilateral    Hidradenitis axillaris    HSV-2 infection    Myopic astigmatism, bilateral    Strabismus    S/P myomectomy    DKA, type 2, not at goal [facility])    Acute nonintractable headache    MSSA bacteremia    Diabetic ketoacidosis without coma associated with type 2 diabetes mellitus (HCC)    Left hip pain         RECOMMENDATIONS:  [name] is a 38 y.o. female with a medical history significant for type 2 diabetes, mixed hyperlipidemia, and hypertension, who presented from home to the ED on 03/27/2024, with headache and shortness of breath and being managed for DKA and bacteremia.    Sepsis  MSSA bacteremia  Etiology currently unknown  Patient presented in DKA secondary to medicaiton non compliance and repeatedly developed episodes of fever. She met sepsis criteria as she was tachycardic and tachypnic with leukocytosis. She  was started on ceftriaxone  12/14 for concerns of UTI and started on vancomycin  thereafter on 12/15 when blood cultures resulted with MSSA. Patient reporting left hip pain but denies recent trauma or skin break down, surgery or skin instrumentation, or dental work. She denies IVDU.   - TTE with no significant valvular abnormality or vegetation seen 12/16  - Vancomycin  deescalated to nafcillin  2 g IV every 4 hours on 12/15  - PICC for IV antibiotics placed (12/16)  - Repeat blood cultures positive for staph aureus (12/17)  - Orthopedic consulted to evaluate left hip pain  - Synovial aspirate of the left hip (12/18) with tan purulent fluid  - continue nafcillin   - Will adjust antibiotics according to cultures    INFUSION ORDERS:  - Drug: IV daptomycin  400 mg daily  - Planned End date: 04/30/2024  - Diagnosis: MSSA bacteremia, left hip septic bursitis   - Has received test dose in hospital  - Routine line maintenance  - Check CBC w diff, CMP, ESR, CRP, CK  every Mon or Tue - FAX result to [phone]  - Call with antibiotic / infusion issues, [phone]  - Call with any change in status, transfer in or out of a facility or to hospital; This COC is only valid for current disposition - [phone].    - No f/u in outpatient ID office necessary      DKA - resolved  - has been transitioned from insulin  drip to lantus  and lispro and is tolerating PO intake    Chronic  medical conditions:  Hypothyroidism - home levothyroxine   Hypertension - home losartan   Hyperlipidemia -  home simvastatin       Medical Decision Making:  The following items were considered in medical decision making:  Discussion of patient care with other providers  Reviewed clinical lab tests  Reviewed radiology tests  Reviewed other diagnostic tests/interventions  Independent review of radiologic images  Microbiology cultures and other micro tests reviewed      Risk of Complications/Morbidity: High   Illness(es)/ Infection present that pose threat to bodily function.    There is potential for severe exacerbation of infection/side effects of treatment.  Therapy requires intensive monitoring for antimicrobial agent toxicity    Discussed with Dr. He.    Dorn Ovens, MD  Internal Medicine, PGY-3  "

## 2024-04-01 NOTE — Care Coordination (Signed)
"  CM spoke with patient at bedside.  She plans to go back to work after Christmas break.  She does drive herself and could come to the infusion center daily after work.  CM asked for infusion center approval for charity.  Patient states she will have insurance coverage after the new year.    Nat Dais, RN, BSN, Case Manager  [facility]   [phone]    "

## 2024-04-02 LAB — CBC WITH AUTO DIFFERENTIAL
Basophils %: 0.4 %
Basophils Absolute: 0.1 K/uL (ref 0.0–0.2)
Eosinophils %: 0.6 %
Eosinophils Absolute: 0.1 K/uL (ref 0.0–0.6)
Hematocrit: 22 % — ABNORMAL LOW (ref 36.0–48.0)
Hemoglobin: 7 g/dL — ABNORMAL LOW (ref 12.0–16.0)
Lymphocytes %: 10.3 %
Lymphocytes Absolute: 1.7 K/uL (ref 1.0–5.1)
MCH: 25 pg — ABNORMAL LOW (ref 26.0–34.0)
MCHC: 31.5 g/dL (ref 31.0–36.0)
MCV: 79.2 fL — ABNORMAL LOW (ref 80.0–100.0)
MPV: 8.3 fL (ref 5.0–10.5)
Monocytes %: 6.7 %
Monocytes Absolute: 1.1 K/uL (ref 0.0–1.3)
Neutrophils %: 82 %
Neutrophils Absolute: 13.7 K/uL — ABNORMAL HIGH (ref 1.7–7.7)
Platelets: 583 K/uL — ABNORMAL HIGH (ref 135–450)
RBC: 2.78 M/uL — ABNORMAL LOW (ref 4.00–5.20)
RDW: 17 % — ABNORMAL HIGH (ref 12.4–15.4)
WBC: 16.7 K/uL — ABNORMAL HIGH (ref 4.0–11.0)

## 2024-04-02 LAB — BASIC METABOLIC PANEL W/ REFLEX TO MG FOR LOW K
Anion Gap: 11 (ref 3–16)
BUN: 4 mg/dL — ABNORMAL LOW (ref 7–20)
CO2: 28 mmol/L (ref 21–32)
Calcium: 8.2 mg/dL — ABNORMAL LOW (ref 8.3–10.6)
Chloride: 98 mmol/L — ABNORMAL LOW (ref 99–110)
Creatinine: 0.5 mg/dL — ABNORMAL LOW (ref 0.6–1.1)
Est, Glom Filt Rate: >90
Glucose: 105 mg/dL — ABNORMAL HIGH (ref 70–99)
Potassium reflex Magnesium: 3.2 mmol/L — ABNORMAL LOW (ref 3.5–5.1)
Sodium: 137 mmol/L (ref 136–145)

## 2024-04-02 LAB — PREPARE RBC (CROSSMATCH)
Dispense Status Blood Bank: TRANSFUSED
Dispense Status Blood Bank: TRANSFUSED

## 2024-04-02 LAB — HCG, SERUM, QUALITATIVE: Preg, Serum: NEGATIVE

## 2024-04-02 LAB — POCT GLUCOSE
POC Glucose: 171 mg/dL — ABNORMAL HIGH (ref 70–99)
POC Glucose: 117 mg/dL — ABNORMAL HIGH (ref 70–99)
POC Glucose: 87 mg/dL (ref 70–99)
POC Glucose: 100 mg/dL — ABNORMAL HIGH (ref 70–99)
POC Glucose: 130 mg/dL — ABNORMAL HIGH (ref 70–99)

## 2024-04-02 LAB — POTASSIUM: Potassium: 3.6 mmol/L (ref 3.5–5.1)

## 2024-04-02 LAB — ANA AB HEP2 IGG BY IFA: Antinuclear Antibody, Hep-2, IGG: <1:80 {titer}

## 2024-04-02 LAB — TYPE AND SCREEN
ABO/Rh: B POS
Antibody Screen: NEGATIVE

## 2024-04-02 LAB — MAGNESIUM: Magnesium: 2 mg/dL (ref 1.80–2.40)

## 2024-04-02 LAB — CULTURE, BLOOD 1: Blood Culture, Routine: POSITIVE

## 2024-04-02 MED ORDER — FENTANYL CITRATE (PF) 100 MCG/2ML IJ SOLN
100 | INTRAMUSCULAR | Status: AC
Start: 2024-04-02 — End: 2024-04-02

## 2024-04-02 MED ORDER — METHOCARBAMOL 750 MG PO TABS
750 | Freq: Three times a day (TID) | ORAL | Status: AC | PRN
Start: 2024-04-02 — End: 2024-04-04
  Administered 2024-04-03 – 2024-04-04 (×3): 750 mg via ORAL

## 2024-04-02 MED ORDER — SODIUM CHLORIDE 0.9 % IV SOLN
0.9 | INTRAVENOUS | Status: DC | PRN
Start: 2024-04-02 — End: 2024-04-02

## 2024-04-02 MED ORDER — BISACODYL 5 MG PO TBEC
5 | Freq: Every day | ORAL | Status: DC | PRN
Start: 2024-04-02 — End: 2024-04-07

## 2024-04-02 MED ORDER — FENTANYL CITRATE (PF) 100 MCG/2ML IJ SOLN
100 | INTRAMUSCULAR | Status: AC
Start: 2024-04-02 — End: 2024-04-03

## 2024-04-02 MED ORDER — ASPIRIN 81 MG PO TBEC
81 | Freq: Two times a day (BID) | ORAL | Status: DC
Start: 2024-04-02 — End: 2024-04-07
  Administered 2024-04-03 – 2024-04-07 (×10): 81 mg via ORAL

## 2024-04-02 MED ORDER — ONDANSETRON HCL 4 MG/2ML IJ SOLN
4 | Freq: Once | INTRAMUSCULAR | Status: DC | PRN
Start: 2024-04-02 — End: 2024-04-02
  Administered 2024-04-02: 19:00:00 4 via INTRAVENOUS

## 2024-04-02 MED ORDER — LACTATED RINGERS IV BOLUS
Freq: Once | INTRAVENOUS | Status: AC
Start: 2024-04-02 — End: 2024-04-02
  Administered 2024-04-02: 10:00:00 1000 mL via INTRAVENOUS

## 2024-04-02 MED ORDER — METFORMIN HCL ER 500 MG PO TB24
500 | Freq: Every day | ORAL | Status: DC
Start: 2024-04-02 — End: 2024-04-02

## 2024-04-02 MED ORDER — INSULIN GLARGINE 100 UNIT/ML SC SOPN
100 | Freq: Every evening | SUBCUTANEOUS | Status: DC
Start: 2024-04-02 — End: 2024-04-02

## 2024-04-02 MED ORDER — OXYCODONE HCL 5 MG PO TABS
5 | ORAL | Status: DC | PRN
Start: 2024-04-02 — End: 2024-04-07
  Administered 2024-04-05: 06:00:00 5 mg via ORAL

## 2024-04-02 MED ORDER — NORMAL SALINE FLUSH 0.9 % IV SOLN
0.9 | INTRAVENOUS | Status: DC | PRN
Start: 2024-04-02 — End: 2024-04-07

## 2024-04-02 MED ORDER — DEXAMETHASONE SODIUM PHOSPHATE 4 MG/ML IJ SOLN
4 | Freq: Once | INTRAMUSCULAR | Status: DC | PRN
Start: 2024-04-02 — End: 2024-04-02
  Administered 2024-04-02: 19:00:00 8 via INTRAVENOUS

## 2024-04-02 MED ORDER — ROCURONIUM BROMIDE 50 MG/5ML IV SOLN
50 | Freq: Once | INTRAVENOUS | Status: DC | PRN
Start: 2024-04-02 — End: 2024-04-02
  Administered 2024-04-02: 19:00:00 50 via INTRAVENOUS

## 2024-04-02 MED ORDER — SUGAMMADEX SODIUM 200 MG/2ML IV SOLN
200 | INTRAVENOUS | Status: AC
Start: 2024-04-02 — End: 2024-04-02

## 2024-04-02 MED ORDER — ONDANSETRON HCL 4 MG/2ML IJ SOLN
4 | Freq: Four times a day (QID) | INTRAMUSCULAR | Status: DC | PRN
Start: 2024-04-02 — End: 2024-04-07

## 2024-04-02 MED ORDER — PHENYLEPHRINE HCL 1 MG/10ML IV SOSY
1 | Freq: Once | INTRAVENOUS | Status: DC | PRN
Start: 2024-04-02 — End: 2024-04-02
  Administered 2024-04-02 (×3): 100 via INTRAVENOUS

## 2024-04-02 MED ORDER — HYDROMORPHONE HCL PF 1 MG/ML IJ SOLN
1 | INTRAMUSCULAR | Status: DC | PRN
Start: 2024-04-02 — End: 2024-04-07
  Administered 2024-04-03 – 2024-04-04 (×4): 0.5 mg via INTRAVENOUS

## 2024-04-02 MED ORDER — MONTELUKAST SODIUM 10 MG PO TABS
10 | Freq: Every day | ORAL | Status: DC
Start: 2024-04-02 — End: 2024-04-07
  Administered 2024-04-02 – 2024-04-07 (×6): 10 mg via ORAL

## 2024-04-02 MED ORDER — FENTANYL CITRATE (PF) 100 MCG/2ML IJ SOLN
100 | INTRAMUSCULAR | Status: DC | PRN
Start: 2024-04-02 — End: 2024-04-02
  Administered 2024-04-02: 20:00:00 25 ug via INTRAVENOUS

## 2024-04-02 MED ORDER — SUGAMMADEX SODIUM 200 MG/2ML IV SOLN
200 | Freq: Once | INTRAVENOUS | Status: DC | PRN
Start: 2024-04-02 — End: 2024-04-02
  Administered 2024-04-02: 19:00:00 180 via INTRAVENOUS

## 2024-04-02 MED ORDER — BUPIVACAINE HCL (PF) 0.5 % IJ SOLN
0.5 | INTRAMUSCULAR | Status: AC
Start: 2024-04-02 — End: 2024-04-02

## 2024-04-02 MED ORDER — NORMAL SALINE FLUSH 0.9 % IV SOLN
0.9 | Freq: Two times a day (BID) | INTRAVENOUS | Status: DC
Start: 2024-04-02 — End: 2024-04-02

## 2024-04-02 MED ORDER — MIDAZOLAM HCL 2 MG/2ML IJ SOLN
2 | Freq: Once | INTRAMUSCULAR | Status: DC | PRN
Start: 2024-04-02 — End: 2024-04-02
  Administered 2024-04-02 (×2): 1 via INTRAVENOUS

## 2024-04-02 MED ORDER — SUCCINYLCHOLINE CHLORIDE 200 MG/10ML IV SOSY
200 | INTRAVENOUS | Status: AC
Start: 2024-04-02 — End: 2024-04-02

## 2024-04-02 MED ORDER — HYDROMORPHONE HCL PF 1 MG/ML IJ SOLN
1 | INTRAMUSCULAR | Status: DC | PRN
Start: 2024-04-02 — End: 2024-04-02

## 2024-04-02 MED ORDER — PHENYLEPHRINE HCL 1 MG/10ML IV SOSY
1 | INTRAVENOUS | Status: AC
Start: 2024-04-02 — End: 2024-04-02

## 2024-04-02 MED ORDER — PROPOFOL 200 MG/20ML IV EMUL
200 | Freq: Once | INTRAVENOUS | Status: DC | PRN
Start: 2024-04-02 — End: 2024-04-02
  Administered 2024-04-02: 19:00:00 100 via INTRAVENOUS

## 2024-04-02 MED ORDER — MIDAZOLAM HCL 2 MG/2ML IJ SOLN
2 | INTRAMUSCULAR | Status: AC
Start: 2024-04-02 — End: 2024-04-02

## 2024-04-02 MED ORDER — ONDANSETRON HCL 4 MG/2ML IJ SOLN
4 | INTRAMUSCULAR | Status: AC
Start: 2024-04-02 — End: 2024-04-02

## 2024-04-02 MED ORDER — HYDROMORPHONE HCL PF 1 MG/ML IJ SOLN
1 | INTRAMUSCULAR | Status: DC | PRN
Start: 2024-04-02 — End: 2024-04-07

## 2024-04-02 MED ORDER — LACTATED RINGERS IV SOLN
INTRAVENOUS | Status: DC | PRN
Start: 2024-04-02 — End: 2024-04-02
  Administered 2024-04-02: 19:00:00 via INTRAVENOUS

## 2024-04-02 MED ORDER — LIDOCAINE (CARDIAC) 100 MG/5ML IV SOLN (MIXTURES ONLY)
100 | INTRAVENOUS | Status: AC
Start: 2024-04-02 — End: 2024-04-02

## 2024-04-02 MED ORDER — OXYCODONE HCL 5 MG PO TABS
5 | ORAL | Status: DC | PRN
Start: 2024-04-02 — End: 2024-04-02

## 2024-04-02 MED ORDER — DEXAMETHASONE SODIUM PHOSPHATE 4 MG/ML IJ SOLN
4 | INTRAMUSCULAR | Status: AC
Start: 2024-04-02 — End: 2024-04-02

## 2024-04-02 MED ORDER — NORMAL SALINE FLUSH 0.9 % IV SOLN
0.9 | Freq: Two times a day (BID) | INTRAVENOUS | Status: DC
Start: 2024-04-02 — End: 2024-04-07
  Administered 2024-04-03 – 2024-04-07 (×5): 10 mL via INTRAVENOUS

## 2024-04-02 MED ORDER — ONDANSETRON 4 MG PO TBDP
4 | Freq: Three times a day (TID) | ORAL | Status: DC | PRN
Start: 2024-04-02 — End: 2024-04-07

## 2024-04-02 MED ORDER — PROPOFOL 200 MG/20ML IV EMUL
200 | INTRAVENOUS | Status: AC
Start: 2024-04-02 — End: 2024-04-02

## 2024-04-02 MED ORDER — LABETALOL HCL 5 MG/ML IV SOLN
5 | INTRAVENOUS | Status: DC | PRN
Start: 2024-04-02 — End: 2024-04-02

## 2024-04-02 MED ORDER — SODIUM CHLORIDE (PF) 0.9 % IJ SOLN
0.9 | INTRAMUSCULAR | Status: DC | PRN
Start: 2024-04-02 — End: 2024-04-02

## 2024-04-02 MED ORDER — SODIUM CHLORIDE 0.9 % IV SOLN
0.9 | INTRAVENOUS | Status: DC | PRN
Start: 2024-04-02 — End: 2024-04-07

## 2024-04-02 MED ORDER — ACETAMINOPHEN 325 MG PO TABS
325 | ORAL | Status: DC | PRN
Start: 2024-04-02 — End: 2024-04-07
  Administered 2024-04-03 – 2024-04-07 (×7): 650 mg via ORAL

## 2024-04-02 MED ORDER — CETIRIZINE HCL 10 MG PO TABS
10 | Freq: Every day | ORAL | Status: DC
Start: 2024-04-02 — End: 2024-04-07
  Administered 2024-04-02 – 2024-04-07 (×6): 10 mg via ORAL

## 2024-04-02 MED ORDER — METHOCARBAMOL 1000 MG/10ML IJ SOLN
1000 | Freq: Three times a day (TID) | INTRAMUSCULAR | Status: AC | PRN
Start: 2024-04-02 — End: 2024-04-04

## 2024-04-02 MED ORDER — FUROSEMIDE 20 MG PO TABS
20 | Freq: Every day | ORAL | Status: DC | PRN
Start: 2024-04-02 — End: 2024-04-07

## 2024-04-02 MED ORDER — SODIUM CHLORIDE 0.9 % IV SOLN
0.9 | INTRAVENOUS | Status: DC
Start: 2024-04-02 — End: 2024-04-03
  Administered 2024-04-02: 23:00:00 via INTRAVENOUS

## 2024-04-02 MED ORDER — ROCURONIUM BROMIDE 50 MG/5ML IV SOLN
50 | INTRAVENOUS | Status: AC
Start: 2024-04-02 — End: 2024-04-02

## 2024-04-02 MED ORDER — SEMAGLUTIDE (1 MG/DOSE) 4 MG/3ML SC SOPN
4 | SUBCUTANEOUS | Status: DC
Start: 2024-04-02 — End: 2024-04-02

## 2024-04-02 MED ORDER — FERROUS SULFATE 325 (65 FE) MG PO TABS
325 | Freq: Every day | ORAL | Status: DC
Start: 2024-04-02 — End: 2024-04-07
  Administered 2024-04-03 – 2024-04-07 (×5): 325 mg via ORAL

## 2024-04-02 MED ORDER — LIDOCAINE (CARDIAC) 100 MG/5ML IV SOLN (MIXTURES ONLY)
100 | Freq: Once | INTRAVENOUS | Status: DC | PRN
Start: 2024-04-02 — End: 2024-04-02
  Administered 2024-04-02: 19:00:00 60 via INTRAVENOUS

## 2024-04-02 MED ORDER — OXYCODONE HCL 5 MG PO TABS
5 | ORAL | Status: DC | PRN
Start: 2024-04-02 — End: 2024-04-07
  Administered 2024-04-03 – 2024-04-07 (×17): 10 mg via ORAL

## 2024-04-02 MED ORDER — PROCHLORPERAZINE EDISYLATE 10 MG/2ML IJ SOLN
10 | Freq: Once | INTRAMUSCULAR | Status: DC | PRN
Start: 2024-04-02 — End: 2024-04-02

## 2024-04-02 MED ORDER — CEFAZOLIN SODIUM 1 G IJ SOLR
1 | Freq: Once | INTRAMUSCULAR | Status: DC | PRN
Start: 2024-04-02 — End: 2024-04-02
  Administered 2024-04-02: 19:00:00 2 via INTRAVENOUS

## 2024-04-02 MED ORDER — NORMAL SALINE FLUSH 0.9 % IV SOLN
0.9 | INTRAVENOUS | Status: DC | PRN
Start: 2024-04-02 — End: 2024-04-02

## 2024-04-02 MED ORDER — BUPIVACAINE HCL (PF) 0.5 % IJ SOLN
0.5 | INTRAMUSCULAR | Status: DC | PRN
Start: 2024-04-02 — End: 2024-04-02
  Administered 2024-04-02: 19:00:00 30 via INTRADERMAL

## 2024-04-02 MED ORDER — FENTANYL CITRATE (PF) 100 MCG/2ML IJ SOLN
100 | Freq: Once | INTRAMUSCULAR | Status: DC | PRN
Start: 2024-04-02 — End: 2024-04-02
  Administered 2024-04-02 (×4): 25 via INTRAVENOUS

## 2024-04-02 MED ORDER — VALACYCLOVIR HCL 500 MG PO TABS
500 | Freq: Every day | ORAL | Status: DC
Start: 2024-04-02 — End: 2024-04-07

## 2024-04-02 MED FILL — ROCURONIUM BROMIDE 50 MG/5ML IV SOLN: 50 MG/5ML | INTRAVENOUS | Qty: 10 | Fill #0

## 2024-04-02 MED FILL — PERCOCET 5-325 MG PO TABS: 5-325 mg | ORAL | Qty: 1 | Fill #0

## 2024-04-02 MED FILL — MONTELUKAST SODIUM 10 MG PO TABS: 10 mg | ORAL | Qty: 1 | Fill #0

## 2024-04-02 MED FILL — PANTOPRAZOLE SODIUM 40 MG PO TBEC: 40 mg | ORAL | Qty: 1 | Fill #0

## 2024-04-02 MED FILL — NAFCILLIN SODIUM 2 G IJ SOLR: 2 g | INTRAMUSCULAR | Qty: 2000 | Fill #0

## 2024-04-02 MED FILL — HYDROMORPHONE HCL 1 MG/ML IJ SOLN: 1 mg/mL | INTRAMUSCULAR | Qty: 1 | Fill #0

## 2024-04-02 MED FILL — LIDOCAINE HCL (CARDIAC) PF 100 MG/5ML IV SOSY: 100 MG/5ML | INTRAVENOUS | Qty: 5 | Fill #0

## 2024-04-02 MED FILL — SUCCINYLCHOLINE CHLORIDE 200 MG/10ML IV SOSY: 200 MG/10ML | INTRAVENOUS | Qty: 10 | Fill #0

## 2024-04-02 MED FILL — INSULIN LISPRO 100 UNIT/ML IJ SOLN: 100 [IU]/mL | INTRAMUSCULAR | Qty: 7 | Fill #0

## 2024-04-02 MED FILL — MARCAINE PRESERVATIVE FREE 0.5 % IJ SOLN: 0.5 % | INTRAMUSCULAR | Qty: 30 | Fill #0

## 2024-04-02 MED FILL — POTASSIUM CHLORIDE CRYS ER 20 MEQ PO TBCR: 20 meq | ORAL | Qty: 2 | Fill #0

## 2024-04-02 MED FILL — DIPRIVAN 200 MG/20ML IV EMUL: 200 MG/20ML | INTRAVENOUS | Qty: 20 | Fill #0

## 2024-04-02 MED FILL — VALACYCLOVIR HCL 500 MG PO TABS: 500 mg | ORAL | Qty: 1 | Fill #0

## 2024-04-02 MED FILL — FENTANYL CITRATE (PF) 100 MCG/2ML IJ SOLN: 100 MCG/2ML | INTRAMUSCULAR | Qty: 2 | Fill #0

## 2024-04-02 MED FILL — METHOCARBAMOL 1000 MG/10ML IJ SOLN: 1000 MG/10ML | INTRAMUSCULAR | Qty: 10 | Fill #0

## 2024-04-02 MED FILL — LANTUS 100 UNIT/ML SC SOLN: 100 [IU]/mL | SUBCUTANEOUS | Qty: 15 | Fill #0

## 2024-04-02 MED FILL — MIDAZOLAM HCL 2 MG/2ML IJ SOLN: 2 mg/mL | INTRAMUSCULAR | Qty: 2 | Fill #0

## 2024-04-02 MED FILL — DEXAMETHASONE SODIUM PHOSPHATE 4 MG/ML IJ SOLN: 4 mg/mL | INTRAMUSCULAR | Qty: 1 | Fill #0

## 2024-04-02 MED FILL — ONDANSETRON HCL 4 MG/2ML IJ SOLN: 4 MG/2ML | INTRAMUSCULAR | Qty: 2 | Fill #0

## 2024-04-02 MED FILL — LEVOTHYROXINE SODIUM 25 MCG PO TABS: 25 ug | ORAL | Qty: 1 | Fill #0

## 2024-04-02 MED FILL — ACETAMINOPHEN 325 MG PO TABS: 325 mg | ORAL | Qty: 2 | Fill #0

## 2024-04-02 MED FILL — PHENYLEPHRINE HCL (PRESSORS) 1 MG/10ML IV SOSY: 1 MG/0ML | INTRAVENOUS | Qty: 10 | Fill #0

## 2024-04-02 MED FILL — CETIRIZINE HCL 10 MG PO TABS: 10 mg | ORAL | Qty: 1 | Fill #0

## 2024-04-02 MED FILL — BRIDION 200 MG/2ML IV SOLN: 200 MG/2ML | INTRAVENOUS | Qty: 2 | Fill #0

## 2024-04-02 NOTE — Progress Notes (Signed)
 "    V2.0    US[facility] Progress Note      Name:  Shelly Richard DOB/Age/Sex: 07/03/1985  (38 y.o. female)   MRN & CSN:  [id number] & [id number] Encounter Date/Time: 04/02/2024 12:21 PM EST   Location:  OR/NONE PCP: Alexandro Bernice FORBES, [name]     Attending:Griffen Frayne, Marne LABOR, MD       Hospital Day: 8  Subjective:     Chief Complaint: Sepsis    Shelly Richard is a 38 y.o. female who presents with sepsis    Subjective:  Patient feels a little better.  Still having hip pain  Low-grade temp  Going for surgery today    Assessment and Recommendations   HPI per admitting provider :      Hospital course:    Shelly Richard is a 38 y.o. female with pmh of diabetes, hypertension, hyperlipidemia who presents with DKA, type 2, not at goal [facility]) with sepsis.  Patient has not been taking her medications due to insurance issues.  DKA was treated with an insulin  drip and now switched to subcutaneous insulin .  Workup revealed MSSA bacteremia.  Patient CT scan showed some fluid around the hip MRI confirmed possible trochanteric bursal infection.  Underwent aspiration on 12/18, culture grew staph      Plan:     DKA resolved    Uncontrolled type 2 diabetes-patient's blood sugars running on the lower side and patient is n.p.o. will hold off adjusting any kind of diabetic medication.  Will need to be readjusted tomorrow  - On Lantus  with Premeal insulin   - On sliding scale insulin   - As patient is on insulin  monitor blood sugars hypoglycemia    Sepsis-white count still elevated, temperature curve coming down  - Present on admission  - Still spiking fever    MSSA bacteremia-  - Was initially on vancomycin  switched to nafcillin  on 12/15  - Repeat blood culture pending positive on 12/15, repeat blood culture on 12/16 negative  - TEE normal EF no vegetation      Acute on chronic anemia-posttransfusion hemoglobin stable around 7  -Hemoglobin dropped from 10.9 to 5.7  -Iron  studies shows iron  deficiency but with bacteremia will hold off IV  iron   - In the past patient needed iron  transfusions  - In the past patient had a lot of menstrual bleeding and was on Femara for fibroid    Hypothyroidism  -Resume low-dose Levothyroid    Hip pain-with septic bursitis-for I&D of left hip trochanteric bursa   - CT shows some fluid collection  - MRI done on 12/17 shows a 4.6 cm left greater trochanteric bursal fluid collection which tracks into the gluteus muscle with surrounding edema concern for septic bursitis/abscess  - Culture positive for staph            I spent _   minutes in the care of this patient.  Over 50% of that time was in face-to-face counseling regarding disease process, diagnostic testing, preventative measures, and answering patient and family questions.     Diet Diet NPO Exceptions are: Sips of Water  with Meds   DVT Prophylaxis []  Lovenox , []   Heparin, []  SCDs, []  Ambulation,  []  Eliquis, []  Xarelto  []  Coumadin   Code Status Full Code   Disposition   Unclear     Surrogate Decision Maker/ POA Spouse       MDM  [x]  High (any 2 of A, B, or C)    A.  Problems (any 1)  [x]  Acute/Chronic Illness/injury posing threat to life or bodily function:    []  Severe exacerbation of chronic illness:    ---------------------------------------------------------------------  B. Risk of Treatment (any 1)   []  IV ABX requiring serial renal monitoring for nephrotoxicity:     [x]  IV Narcotic analgesia for adverse drug reaction  []  IV diuresis requiring serial monitoring for renal impairment and electrolyte derangements  []  Critical electrolyte abnormalities requiring IV replacement and close serial monitoring  [x]  Insulin  - monitoring serial FSBS for Hypoglycemic adverse drug reaction  []  Anticoagulation requiring serial monitoring of coagulation factors  []  IV/IM Controlled Substances order (any 1)   []  One-time Order including Drug Name/Route, Reason Ordered:   []  Scheduled Order including Drug Name/Route, Reason Ordered or Continued:   []  PRN Order including Drug  Name/Route, Reason Ordered or Continued:  [] Other -   []  Decision to De-escalate Care this DOS due to change in treatment goals:  []  Decision to Escalate Care To:   []  Major Surgery/Procedure (any 1):   Elective with patient risk factors including Procedure Type and Risk Factors:   Emergent Procedure Type:    ----------------------------------------------------------------------  C. Data (any 2)  []  Data Review (3+ points)  []  Consultant Note reviewed with note date, specialty, and summary (1 point each)  [x]  All current labs were reviewed and interpreted for clinical significance   []  Studies Reviewed (1 point each):   []  Collateral history obtained including from who and why needed (Max 1 point):  []  Independent (Deakin Lacek DELENA Creighton, MD) Interpretation of tests (any 1)  []  Rhythm Strip (Telemetry) personally reviewed and interpreted as documented above    []  Imaging personally reviewed and interpreted, includes:    [x]  Discussion (any 1)  [x]  Discussed the discharge plan in detail with case management including timing/barriers to discharge, need for support services and/or placement decision   []  Discussed management of the case with:         Review of Systems:      Pertinent positives and negatives discussed in HPI    Objective:   No intake or output data in the 24 hours ending 04/02/24 1335     Vitals:   Vitals:    04/02/24 0916 04/02/24 1039 04/02/24 1225 04/02/24 1306   BP:   117/67 125/71   Pulse:   (!) 113 (!) 110   Resp: 18 16 18 16    Temp:   98.4 F (36.9 C) 99.2 F (37.3 C)   TempSrc:   Oral Oral   SpO2:   100% 98%   Weight:       Height:             Physical Exam:      General: NAD  Eyes: EOMI  ENT: neck supple  Cardiovascular: Regular rate.  Respiratory: Clear to auscultation  Gastrointestinal: Soft, non tender  Genitourinary: no suprapubic tenderness  Musculoskeletal: No edema  Skin: warm, dry  Neuro: Alert.  Psych: Mood appropriate.         Medications:   Medications:    sodium chloride  flush  5-40 mL  IntraVENous 2 times per day    lidocaine  1 % injection  50 mg IntraDERmal Once    insulin  glargine  15 Units SubCUTAneous Nightly    insulin  lispro  0-8 Units SubCUTAneous 4x Daily AC & HS    pantoprazole   40 mg Oral QAM AC    nafcillin   2,000 mg IntraVENous Q4H    levothyroxine   25 mcg Oral Daily    insulin  lispro  7 Units SubCUTAneous TID WC    [Held by provider] enoxaparin   40 mg SubCUTAneous Daily      Infusions:    sodium chloride       sodium chloride       dextrose  5 % and 0.45 % NaCl Stopped (03/28/24 2303)    dextrose        PRN Meds: HYDROmorphone , 0.25 mg, Q4H PRN  potassium chloride , 40 mEq, PRN   Or  potassium alternative oral replacement, 40 mEq, PRN   Or  potassium chloride , 10 mEq, PRN  sodium chloride  flush, 5-40 mL, PRN  sodium chloride , , PRN  oxyCODONE -acetaminophen , 1 tablet, Q4H PRN  sodium chloride , , PRN  dextrose  bolus, 125 mL, PRN   Or  dextrose  bolus, 250 mL, PRN  magnesium  sulfate, 2,000 mg, PRN  sodium phosphate  15 mmol in sodium chloride  0.9 % 250 mL IVPB, 15 mmol, PRN  magnesium  sulfate, 2,000 mg, PRN  ondansetron , 4 mg, Q8H PRN   Or  ondansetron , 4 mg, Q6H PRN  polyethylene glycol, 17 g, Daily PRN  acetaminophen , 650 mg, Q6H PRN   Or  acetaminophen , 650 mg, Q6H PRN  dextrose  5 % and 0.45 % NaCl, , Continuous PRN  glucose, 4 tablet, PRN  glucagon , 1 mg, PRN  dextrose , , Continuous PRN  albuterol , 2.5 mg, Q6H PRN        Labs and Imaging   CT SOFT TISSUE NECK WO CONTRAST  Result Date: 03/28/2024  DATE: March 28, 2024 EXAM: CT SOFT TISSUE NECK WO CONTRAST INDICATION: neck pain, fever; COMPARISON: None. TECHNIQUE: Axial CT imaging obtained from the skull base through the lung apices. Axial images and multiplanar reformatted images reviewed.   Up-to-date CT equipment and radiation dose reduction techniques were employed. IV Contrast: None FINDINGS: SCOUT: Nondiagnostic scout image(s) obtained for localization purposes, not for diagnostic use DEVICES/HARDWARE: None . ORBITS, PARANASAL  SINUSES, SKULL BASE: Limited images of the base of the brain demonstrate no mass effect. Orbits are normal. Imaged sinuses are clear. CERVICAL SPINE/BONES: No destructive process. NASAL CAVITY/NASOPHARYNX: No mass. ORAL CAVITY/OROPHARYNX: No evidence of mass. HYPOPHARYNX AND LARYNX: Normal. DEEP NECK SPACES: The parapharyngeal, retropharyngeal and masticator spaces are normal. SALIVARY GLANDS: Submandibular glands, sublingual glands and parotid glands are normal. THYROID GLAND: Normal. LYMPH NODES: Nonenlarged cervical lymph nodes bilaterally. THORACIC INLET: No mass or acute abnormality involving the visualized mediastinal structures, trachea or lung apices. OTHER: No significant abnormality.     1. Negative. This dictation was created with voice recognition software.  While attempts have been made to review the dictation as it was transcribed, occasionally the spoken word can be misinterpreted  by the technology, leading to omissions or inappropriate words or phrases.  If questions or concerns arise, please contact the Radiology Department. Electronically signed by Elouise Hugh, MD    CT ABDOMEN PELVIS WO CONTRAST Additional Contrast? None  Result Date: 03/28/2024  DATE: 03/28/2024 EXAM: CT ABDOMEN PELVIS WO CONTRAST INDICATION: hip pain Pain COMPARISON: 2015 TECHNIQUE: Axial CT imaging obtained from lung bases through pelvis. Axial images and multiplanar reformatted images are provided for review.  Up-to-date CT equipment and radiation dose reduction techniques were employed. IV Contrast: None Oral Contrast: No. FINDINGS: SCOUT: Nondiagnostic scout image(s) obtained for localization purposes, not for diagnostic use DEVICES/HARDWARE: None . LUNG BASES: Clear. LIVER: There is decreased in attenuation. Liver is up to 25 cm in craniocaudal dimension. GALLBLADDER AND BILIARY TREE: No calcified gallstones. No gallbladder distention.  No intra- or extrahepatic biliary dilatation. PANCREAS: Normal. SPLEEN: Spleen is up  to 14 cm in craniocaudal dimension. ADRENAL GLANDS: Normal. KIDNEYS AND URETERS: No hydronephrosis. Focal areas of striated enhancement of the kidneys bilaterally. Mild perinephric haziness bilaterally without fluid collection. BOWEL: Normal diameter, nonobstructed.  Appendix is not dilated or thickened. Distal colonic diverticulosis. URINARY BLADDER: Normal. REPRODUCTIVE ORGANS: No associated masses. LYMPH NODES: No abnormally enlarged nodes. PERITONEUM/RETROPERITONEUM: No ascites or free air. VESSELS: Aorta and IVC without significant abnormality.  ABDOMINAL WALL: Mild periarticular edema or swelling of the left hip. BONES: No significant abnormality. OTHER FINDINGS: None.     1. Diverticulosis of the colon without evidence of diverticulitis. 2. Focal areas of striated enhancement of the kidneys, previous CT contrast administration. Findings could indicate renal dysfunction or infection. 3. Mild periarticular edema or swelling of the left hip suggesting muscular injury/strain. 4. Hepatosplenomegaly. This dictation was created with voice recognition software.  While attempts have been made to review the dictation as it was transcribed, occasionally the spoken word can be misinterpreted  by the technology, leading to omissions or inappropriate words or phrases.  If questions or concerns arise, please contact the Radiology Department. Electronically signed by Elouise Hugh, MD    CT HEAD WO CONTRAST  Result Date: 03/28/2024  EXAM: CT HEAD WO CONTRAST STUDY DATE: 03/28/2024 11:15 EST INDICATION: neck pain COMPARISON: None TECHNIQUE: Standard per department protocol. Up-to-date CT equipment and radiation dose reduction techniques were employed. CONTRAST: No intravenous FINDINGS: Brain Parenchyma: No midline shift, mass effect, parenchymal hemorrhage, or evidence of acute territorial infarct. Ventricular System and Extra-Axial Spaces: No extra-axial fluid collections. Basilar cisterns are patent. No hydrocephalus.  Extracranial Structures: No significant paranasal sinus disease. No calvarial abnormality. No orbit abnormality.     *  No acute intracranial findings. Electronically signed by Isom Repress    CT CHEST PULMONARY EMBOLISM W CONTRAST  Result Date: 03/27/2024  DATE: 03/27/2024 EXAM: CT CHEST PULMONARY EMBOLISM W CONTRAST INDICATION: c/f PE, Chest pain COMPARISON: None. TECHNIQUE: Axial CT imaging obtained through the chest using CT pulmonary angiogram protocol. Axial images, multiplanar reformatted images and maximum intensity projection images were reviewed.  Up-to-date CT equipment and radiation dose reduction techniques were employed.  IV Contrast Media and volume: 80 cc iodinated contrast FINDINGS: SCOUT: Nondiagnostic scout image(s) obtained for localization purposes, not for diagnostic use DEVICES/HARDWARE: None . DIAGNOSTIC QUALITY: Suboptimal. PULMONARY EMBOLI: None. RIGHT HEART STRAIN: None. LUNGS AND AIRWAYS: Airways are patent.  Lungs are clear. PLEURA: No pleural effusions or significant pleural thickening. HEART / GREAT VESSELS: Heart is not enlarged. ADENOPATHY: None. CHEST WALL / LOWER NECK: No significant abnormality. UPPER ABDOMEN: Normal. BONES: No significant abnormality. OTHER FINDINGS: None.     1. No evidence of pulmonary embolus. This dictation was created with voice recognition software.  While attempts have been made to review the dictation as it was transcribed, occasionally the spoken word can be misinterpreted  by the technology, leading to omissions or inappropriate words or phrases.  If questions or concerns arise, please contact the Radiology Department. Electronically signed by Elouise Hugh, MD    XR CHEST PORTABLE  Result Date: 03/27/2024  Chest HISTORY : Dyspnea VIEWS : 1     Clear lungs. Normal cardiac mediastinal silhouette. Electronically signed by Lynwood Schwalbe, MD      CBC:   Recent Labs     03/31/24  0112 03/31/24  0933 04/01/24  1042 04/02/24  0656   WBC 17.1*  --  16.2*  16.7*  HGB 6.6* 7.0* 7.5* 7.0*   PLT 412  --  504* 583*     BMP:    Recent Labs     03/31/24  0933 04/01/24  1042 04/01/24  1952 04/02/24  0656   NA 135* 135*  --  137   K 3.1* 3.0* 3.6 3.2*   CL 97* 96*  --  98*   CO2 26 28  --  28   BUN 4* 5*  --  4*   CREATININE 0.6 0.6  --  0.5*   GLUCOSE 163* 228*  --  105*     Hepatic: No results for input(s): AST, ALT, BILITOT, ALKPHOS in the last 72 hours.    Invalid input(s): ALB  Lipids:   Lab Results   Component Value Date/Time    CHOL 154 11/25/2023 02:44 PM    HDL 22 11/25/2023 02:44 PM    TRIG 170 11/25/2023 02:44 PM     Hemoglobin A1C:   Lab Results   Component Value Date/Time    LABA1C 13.9 03/27/2024 03:47 AM     TSH:   Lab Results   Component Value Date/Time    TSH 1.92 08/27/2022 04:35 PM     Troponin: No results found for: TROPONINT  Lactic Acid:   Recent Labs     03/31/24  0113   LACTA 1.3     BNP: No results for input(s): PROBNP in the last 72 hours.  UA:  Lab Results   Component Value Date/Time    NITRU Negative 03/28/2024 02:03 AM    COLORU Yellow 03/28/2024 02:03 AM    PHUR 6.0 03/28/2024 02:03 AM    PHUR 5.0 03/28/2024 02:03 AM    PHUR 5.5 02/25/2017 12:22 PM    WBCUA 3-5 03/28/2024 02:03 AM    RBCUA 5-10 03/28/2024 02:03 AM    BACTERIA 3+ 03/28/2024 02:03 AM    CLARITYU Clear 03/28/2024 02:03 AM    LEUKOCYTESUR TRACE 03/28/2024 02:03 AM    UROBILINOGEN 0.2 03/28/2024 02:03 AM    BILIRUBINUR Negative 03/28/2024 02:03 AM    BLOODU SMALL 03/28/2024 02:03 AM    GLUCOSEU Negative 03/28/2024 02:03 AM    KETUA Negative 03/28/2024 02:03 AM     Urine Cultures:   Lab Results   Component Value Date/Time    LABURIN 25,000 CFU/ml  PBP2= Negative   03/28/2024 01:54 AM     Blood Cultures:   Lab Results   Component Value Date/Time    [facility]  03/29/2024 02:48 PM     POSITIVE  Isolated one of two sets  No further workup  Refer to culture #[id number] for sensitivity results       Lab Results   Component Value Date/Time    BLOODCULT2  03/30/2024 04:21 PM     No  Growth to date.  Any change in status will be called.     Organism:   Lab Results   Component Value Date/Time    ORG Staphylococcus aureus 04/01/2024 09:20 AM         Electronically signed by Marne DELENA Creighton, MD on 04/02/2024 at 1:35 PM  Comment: Please note this report has been produced using speech recognition software and may contain errors related to that system including errors in grammar, punctuation, and spelling, as well as words and phrases that may be inappropriate. If there are any questions or concerns, please feel free to contact the dictating provider for clarification.   "

## 2024-04-02 NOTE — Progress Notes (Signed)
"  Pt is alert and oriented x4. Pt has low grade fever, and high HR on shift. MD notified. Pt edorses pain to left hip/leg, pain managed per MAR. Pt is NPO. Pt tolerating ambulation to bathroom with GB/walker. Call light in reach, able to make needs known, fall precautions in place, POC continues.   "

## 2024-04-02 NOTE — Progress Notes (Signed)
 "  Progress Note    Subjective:       Systemic or Specific Complaints: Pain Control    Objective:     Patient Vitals for the past 24 hrs:   BP Temp Temp src Pulse Resp SpO2 Weight   04/02/24 1306 125/71 99.2 F (37.3 C) Oral (!) 110 16 98 % --   04/02/24 1225 117/67 98.4 F (36.9 C) Oral (!) 113 18 100 % --   04/02/24 1039 -- -- -- -- 16 -- --   04/02/24 0916 -- -- -- -- 18 -- --   04/02/24 0915 124/66 100 F (37.8 C) Oral (!) 120 18 100 % --   04/02/24 0631 -- -- -- -- 17 -- --   04/02/24 0552 -- -- -- -- 14 -- --   04/02/24 0528 -- -- -- -- -- -- 88.3 kg (194 lb 10.7 oz)   04/02/24 0522 -- -- -- -- 15 -- --   04/02/24 0410 130/72 100.1 F (37.8 C) Oral (!) 125 18 95 % --   04/02/24 0037 -- -- -- -- 17 -- --   04/02/24 0007 -- -- -- -- 18 -- --   04/01/24 2348 122/70 99.5 F (37.5 C) Oral (!) 120 18 95 % --   04/01/24 2100 128/75 99.9 F (37.7 C) Axillary (!) 122 17 96 % --   04/01/24 1927 -- -- -- -- 18 -- --   04/01/24 1857 -- -- -- -- 14 -- --   04/01/24 1817 -- -- -- -- 18 -- --   04/01/24 1517 -- -- -- -- 18 -- --   04/01/24 1515 123/74 99.7 F (37.6 C) Oral (!) 116 18 100 % --   04/01/24 1404 -- -- -- -- 16 -- --       General: alert, appears stated age, and cooperative   Wound: Tender over greater trochanter   Motion: Painful range of Motion in affected extremity   DVT Exam: No evidence of DVT seen on physical exam.     Additional exam:      Data Review  CBC:   Lab Results   Component Value Date/Time    WBC 16.7 04/02/2024 06:56 AM    RBC 2.78 04/02/2024 06:56 AM    HGB 7.0 04/02/2024 06:56 AM    HCT 22.0 04/02/2024 06:56 AM    PLT 583 04/02/2024 06:56 AM       Renal:   Lab Results   Component Value Date/Time    NA 137 04/02/2024 06:56 AM    K 3.2 04/02/2024 06:56 AM    CL 98 04/02/2024 06:56 AM    CO2 28 04/02/2024 06:56 AM    BUN 4 04/02/2024 06:56 AM    CREATININE 0.5 04/02/2024 06:56 AM    GLUCOSE 105 04/02/2024 06:56 AM    CALCIUM  8.2 04/02/2024 06:56 AM            Assessment:      left hip  greater trochanteric septic bursitis.     Plan:      1:  Radiology aspiration from trochanteric bursa appears to be growing Staph Aureus. Discussed with patient the plan to proceed with surgical I&D of left hip trochanteric bursa for best evacuation of any infectious material  2:  Patient agrees with plan and wishes to proceed  3> discussed with Anesthesia team need to plan for periop anemia    Shelly GRETTA BIDDING, MD  [facility]  [phone] (cell)  [phone] (appointment scheduling)  "

## 2024-04-02 NOTE — Progress Notes (Signed)
 "Infectious Diseases Inpatient Progress Note    Reason for Consult:   MSSA bacteremia. Appreciate recommendations for abx treatment and duration.  Requesting Physician:   Evern Aid, Sheree  Primary Care Physician:  Alexandro Bernice BRAVO, [name]  History Obtained From:   Pt, EPIC    Admit Date: 03/26/2024  Hospital Day: 8    CHIEF COMPLAINT:       Chief Complaint   Patient presents with    Shortness of Breath     C/o Shortness of breath that started this week     Headache     C/o headaches on and off x 6 months, base of neck up to left side of head     OTHER     Out of MEDS since September        Interval History:  Patient seen this morning at bedside. She did not have any further questions. Updated her sister via the telephone in the room with the patient. She will go to the OR today for incision and drainage.         HISTORY OF PRESENT ILLNESS:    [name] is a 38 y.o. female with a medical history significant for type 2 diabetes, mixed hyperlipidemia, and hypertension, who presented from home to the ED on 03/27/2024, with headache and shortness of breath. Of note, patient reportedly had not had health insurance coverage since September, and as a result has not been taking any of her medications. On presentation to the ED patient was tachycardic, hypertensive, and had a leukocytosis. Patient was admitted for DKA.     Initial infectious workup was negative. However, over the following days the patient remained tachycardic and became febrile with a Tmax 102.9 and was started on ceftriaxone  secondary to a UA with 3+ bacteria and +LE. Blood cultures sent on 03/27/2024 resulted in MSSA on 12/15 and patient was started on vancomycin .     Today, patient reports being tired but states that she feels much better than on presentation. She remains tachycardic but afebrile. She denies any systemic or urological signs/symptoms of infection. She denies any recent history of trauma or skin lesions. She is  still endorsing left hip pain. Leukocytosis increasing today.         Past Medical History:    Past Medical History:   Diagnosis Date    Acute headache     Allergies     Asthma     Diabetes mellitus (HCC)     Genital herpes     HTN (hypertension)     Hyperlipidemia     Hypothyroid     Urticaria        Past Surgical History:    Past Surgical History:   Procedure Laterality Date    CT FLUID COLLECTION DRAINAGE SOFT TISSUE  04/01/2024    CT FLUID COLLECTION DRAINAGE SOFT TISSUE 04/01/2024 [facility] CT SCAN    CYST REMOVAL      HYSTEROSCOPY      x2    MYOMECTOMY N/A 02/01/2022    ROBOTIC MYOMECTOMY performed by Foley, Chauncey Sharper, MD at [facility] OR       Current Medications:     sodium chloride  flush  5-40 mL IntraVENous 2 times per day    lidocaine  1 % injection  50 mg IntraDERmal Once    insulin  glargine  15 Units SubCUTAneous Nightly    insulin  lispro  0-8 Units SubCUTAneous 4x Daily AC & HS    pantoprazole   40 mg Oral QAM AC    nafcillin   2,000 mg IntraVENous Q4H    levothyroxine   25 mcg Oral Daily    insulin  lispro  7 Units SubCUTAneous TID WC    [Held by provider] enoxaparin   40 mg SubCUTAneous Daily       Allergies:  Ace inhibitors, Lisinopril, Peanuts [peanut oil], and Walnut    Social History:    TOBACCO:    None  ETOH:    None  DRUGS:   None  OCCUPATION:   Teacher aid    Patient lives at home.    Family History:   No immunodeficiency    REVIEW OF SYSTEMS:    No fever / chills / sweats.  No weight loss.  No visual change, eye pain, eye discharge.    No oral lesion, sore throat, dysphagia.  Denies cough / sputum.   Denies chest pain, palpitations.  Denies n / v / abd pain.  No diarrhea.  Denies dysuria or change in urinary function.  Denies joint swelling or pain.  No myalgia, arthralgia.  Denies skin changes, itching  Denies focal weakness, sensory change or other neurologic symptom    Denies new / worse depression, psychiatric symptoms    PHYSICAL EXAM:      Vitals:    BP 130/72   Pulse (!) 125 Comment: RN notified   Temp 100.1 F (37.8 C) (Oral) Comment: RN notified  Resp 17   Ht 1.626 m (5' 4)   Wt 88.3 kg (194 lb 10.7 oz)   LMP 03/23/2024   SpO2 95%   BMI 33.41 kg/m     GENERAL: No apparent distress.    HEENT: Membranes moist, no oral lesion  NECK:  Supple, no lymphadenopathy  LUNGS: Clear b/l, no rales, no dullness  CARDIAC: RRR, no murmur appreciated  ABD:  + BS, soft / NT  EXT:  No rash, no edema, no lesions. Left hip tender with passive ROM.  NEURO: No focal neurologic findings  PSYCH: Orientation, sensorium, mood normal  LINES:  Peripheral iv    DATA:    Lab Results   Component Value Date    WBC 16.7 (H) 04/02/2024    HGB 7.0 (L) 04/02/2024    HCT 22.0 (L) 04/02/2024    MCV 79.2 (L) 04/02/2024    PLT 583 (H) 04/02/2024     Lab Results   Component Value Date    CREATININE 0.5 (L) 04/02/2024    BUN 4 (L) 04/02/2024    NA 137 04/02/2024    K 3.2 (L) 04/02/2024    CL 98 (L) 04/02/2024    CO2 28 04/02/2024       Hepatic Function Panel:   Lab Results   Component Value Date/Time    ALKPHOS 154 11/25/2023 02:44 PM    ALT 16 11/25/2023 02:44 PM    AST 15 11/25/2023 02:44 PM    BILITOT 0.7 11/25/2023 02:44 PM    BILIDIR <0.2 02/03/2014 06:04 PM    IBILI see below 02/03/2014 06:04 PM       Micro:  Previous Cultures:        Results         Procedure Component Value Units Date/Time     Culture, Urine [[id number]]       Order Status: Sent Specimen: Urine, Clean Catch       Urine Culture [[id number]] Collected: 03/28/24 0154     Order Status: Sent Specimen: Urine voided Updated: 03/28/24 1113  Culture, Blood 1 [[id number]] Collected: 03/27/24 2342     Order Status: Completed Specimen: Blood Updated: 03/29/24 0115       Blood Culture, Routine No Growth to date.  Any change in status will be called.     Narrative:       ORDER#: [id number]                          ORDERED BY: GENSIC, ANNA  SOURCE: Blood                              COLLECTED:  03/27/24 23:42  ANTIBIOTICS AT COLL.:                      RECEIVED :  03/28/24  19:23  If child <=2 yrs old please draw pediatric bottle.~Blood Culture 1     Culture, Blood 2 [[id number]] Collected: 03/27/24 2342     Order Status: Completed Specimen: Blood Updated: 03/29/24 0115       Culture, Blood 2 No Growth to date.  Any change in status will be called.     Narrative:       ORDER#: [id number]                          ORDERED BY: GENSIC, ANNA  SOURCE: Blood                              COLLECTED:  03/27/24 23:42  ANTIBIOTICS AT COLL.:                      RECEIVED :  03/28/24 19:23  If child <=2 yrs old please draw pediatric bottle.~Blood Culture #2     COVID-19 & Influenza Combo [[id number]] Collected: 03/27/24 1607     Order Status: Completed Specimen: Nasopharyngeal Swab Updated: 03/27/24 1741       SARS-CoV-2 RNA, RT PCR NOT DETECTED       Comment: Not Detected results do not preclude SARS-CoV-2 infection and  should not be used as the sole basis for patient management  decisions.  Results must be combined with clinical observations,  patient history, and epidemiological information.     Testing was performed using [employer] SARS-CoV-2 and Influenza A/B  nucleic acid assay. This test is a multiplex Real-Time Reverse  Transcriptase Polymerase Chain Reaction (RT-PCR)-based in vitro  diagnostic test intended for the qualitative detection of nucleic  acids from SARS-CoV-2, influenza A, and influenza B in nasopharyngeal  and nasal swab specimens.     Patient Fact Sheet:  salonclasses.at  Provider Fact Sheet: coursepreviews.dk  EUA: [email or web address]  IFU: [email or web address]     Methodology:  RT-PCR             Influenza A NOT DETECTED       Influenza B NOT DETECTED           Susceptibility (03/31/24)    Staphylococcus aureus    Antibiotic Interpretation MIC  Method Status    clindamycin  Sensitive <=0.25 mcg/mL BACTERIAL SUSCEPTIBILITY PANEL BY MIC     erythromycin  Sensitive <=0.25 mcg/mL BACTERIAL  SUSCEPTIBILITY PANEL BY MIC     levofloxacin Resistant 4 mcg/mL BACTERIAL SUSCEPTIBILITY PANEL BY MIC     linezolid Sensitive 2 mcg/mL BACTERIAL SUSCEPTIBILITY PANEL BY MIC  oxacillin Sensitive 0.5 mcg/mL BACTERIAL SUSCEPTIBILITY PANEL BY MIC     tetracycline Sensitive <=1 mcg/mL BACTERIAL SUSCEPTIBILITY PANEL BY MIC      Tetracyclines have decreased renal elimination and  urinary bladder concentrations.  Use of this class of  agents for urinary tract infection should be limited to  patients that cannot tolerate an alternative therapy.       trimethoprim-sulfamethoxazole Sensitive <=10 mcg/mL BACTERIAL SUSCEPTIBILITY PANEL BY MIC     vancomycin  Sensitive 1 mcg/mL BACTERIAL SUSCEPTIBILITY PANEL BY MIC           IMPRESSION:      Patient Active Problem List   Diagnosis    Mixed hyperlipidemia    Iron  deficiency anemia    Type 2 diabetes mellitus with hyperglycemia, with long-term current use of insulin  (HCC)    Essential hypertension    Acquired hypothyroidism    Mild intermittent asthma without complication    Allergic rhinitis    Heart murmur    Uterine fibroid    Class 1 obesity due to excess calories with serious comorbidity and body mass index (BMI) of 31.0 to 31.9 in adult    Abnormal uterine bleeding    Eczema    Glaucoma suspect, bilateral    Hidradenitis axillaris    HSV-2 infection    Myopic astigmatism, bilateral    Strabismus    S/P myomectomy    DKA, type 2, not at goal [facility])    Acute nonintractable headache    MSSA bacteremia    Diabetic ketoacidosis without coma associated with type 2 diabetes mellitus (HCC)    Left hip pain    Abscess of bursa of left hip         RECOMMENDATIONS:  Ms. JENIPHER HAVEL is a 38 y.o. female with a medical history significant for type 2 diabetes, mixed hyperlipidemia, and hypertension, who presented from home to the ED on 03/27/2024, with headache and shortness of breath and being managed for DKA and bacteremia.    Sepsis  MSSA bacteremia  Possible left greater  trochanteric septic bursitis  Patient presented in DKA secondary to medicaiton non compliance and repeatedly developed episodes of fever. She met sepsis criteria as she was tachycardic and tachypnic with leukocytosis. She was started on ceftriaxone  12/14 for concerns of UTI and started on vancomycin  thereafter on 12/15 when blood cultures resulted with MSSA. Patient reporting left hip pain but denies recent trauma or skin break down, surgery or skin instrumentation, or dental work. She denies IVDU.   - TTE with no significant valvular abnormality or vegetation seen 12/16  - Vancomycin  deescalated to nafcillin  2 g IV every 4 hours on 12/15  - PICC for IV antibiotics at discharge placed  - Orthopedic consulted to evaluate left hip pain.   - Synovial aspirate of the left hip (12/18) with tan purulent fluid   - Orthopedic planning for possible I&D. Please obtain cultures at this time  - continue nafcillin  and plan for daptomycin  on discharge as listed below  - Will further adjust antibiotics accordingly to aspirate cultures and sensitivities as needed  - will require PICC once blood cultures result negative  - Will follow-up I&D operative cultures    DKA - resolved  - has been transitioned from insulin  drip to lantus  and lispro and is tolerating PO intake      INFUSION ORDERS:  - Drug: IV daptomycin  400 mg daily  - Planned End date: 04/30/2024  - Diagnosis: MSSA bacteremia, left hip septic bursitis   -  Has received test dose in hospital  - Routine line maintenance  - Check CBC w diff, CMP, ESR, CRP, CK  every Mon or Tue - FAX result to [phone]  - Call with antibiotic / infusion issues, [phone]  - Call with any change in status, transfer in or out of a facility or to hospital; This COC is only valid for current disposition - [phone].    - No f/u in outpatient ID office necessary      Chronic medical conditions:  Hypothyroidism - home levothyroxine   Hypertension - home losartan   Hyperlipidemia -  home  simvastatin       Medical Decision Making:  The following items were considered in medical decision making:  Discussion of patient care with other providers  Reviewed clinical lab tests  Reviewed radiology tests  Reviewed other diagnostic tests/interventions  Independent review of radiologic images  Microbiology cultures and other micro tests reviewed      Risk of Complications/Morbidity: High   Illness(es)/ Infection present that pose threat to bodily function.   There is potential for severe exacerbation of infection/side effects of treatment.  Therapy requires intensive monitoring for antimicrobial agent toxicity    Discussed with Dr. He.    Dorn Ovens, MD  Internal Medicine, PGY-3  "

## 2024-04-02 NOTE — Flowsheet Note (Signed)
 1st unit of blood started.

## 2024-04-02 NOTE — Anesthesia Pre Procedure (Signed)
 "Department of Anesthesiology  Preprocedure Note       Name:  Shelly Richard   Age:  38 y.o.  DOB:  [birth year]                                          MRN:  [id number]         Date:  04/02/2024      Surgeon: Clotilde):  Rhoad, Lamar Gaskins, MD    Procedure: Procedure(s):  INCISION AND DRAINAGE OF LEFT HIP GREATER TROCHANTERIC BURSA    Medications prior to admission:   Prior to Admission medications   Medication Sig Start Date End Date Taking? Authorizing Provider   Blood Glucose Monitoring Suppl (ACURA BLOOD GLUCOSE METER) w/Device KIT Please provide meter approved by insurance.  Check bs qam 02/16/18  Yes Tussey, Rocio G, MD   insulin  detemir (LEVEMIR  FLEXPEN) 100 UNIT/ML injection pen INJECT 20 UNITS UNDER THE SKIN ONCE NIGHTLY  Patient not taking: Reported on 03/26/2024 12/02/23   Alexandro Bernice FORBES, [name]   Continuous Glucose Sensor (FREESTYLE LIBRE 2 SENSOR) MISC APPLY SENSOR TO SKIN FOR CONTINUOUS BLOOD SUGAR MONITORING, REPLACE EVERY 14 DAYS  Patient not taking: Reported on 03/27/2024 12/02/23   Alexandro Bernice FORBES, [name]   insulin  lispro, 1 Unit Dial , (HUMALOG /ADMELOG ) 100 UNIT/ML SOPN INJECT 10 UNITS UNDER THE SKIN WITH MEALS PLUS INJECT 2 UNITS FOR BLOOD SUGAR  150-200, 4 UNITS IF 201-250, 6 UNITS IF 251-300, 8 UNITS IF 301-350, 10 UNITS IF GREATER THAN 350  Patient not taking: Reported on 03/26/2024 12/02/23   Alexandro Bernice FORBES, [name]   Semaglutide , 1 MG/DOSE, (OZEMPIC , 1 MG/DOSE,) 4 MG/3ML SOPN sc injection Inject 1 mg into the skin every 7 days  Patient not taking: Reported on 03/26/2024 12/02/23   Alexandro Bernice FORBES, [name]   Cholecalciferol (VITAMIN D3) 50 MCG (2000 UT) TABS TAKE ONE TABLET BY MOUTH DAILY  Patient not taking: Reported on 03/27/2024 11/26/23   Alexandro Bernice FORBES, [name]   ferrous sulfate  (FEROSUL) 325 (65 Fe) MG tablet Take 1 tablet by mouth daily (with breakfast)  Patient not taking: Reported on 03/27/2024 11/26/23   Alexandro Bernice FORBES, [name]   metFORMIN   (GLUCOPHAGE -XR) 500 MG extended release tablet Take 4 tablets by mouth daily (with breakfast)  Patient not taking: Reported on 03/26/2024 11/25/23   Alexandro Bernice FORBES, [name]   insulin  glargine (LANTUS  SOLOSTAR) 100 UNIT/ML injection pen Inject 40 Units into the skin nightly  Patient not taking: Reported on 03/26/2024 04/29/23   Osterbrock, Erika E, [name]   Insulin  Pen Needle (DROPLET PEN NEEDLES) 32G X 6 MM MISC USE ONCE DAILY  Patient not taking: Reported on 03/27/2024 07/09/22   Alexandro Bernice FORBES, [name]   dapagliflozin  (FARXIGA ) 10 MG tablet Take 1 tablet by mouth every morning  Patient not taking: Reported on 03/26/2024 05/22/22   Osterbrock, Erika E, [name]   simvastatin  (ZOCOR ) 40 MG tablet Take 1 tablet by mouth daily  Patient not taking: Reported on 03/26/2024 05/22/22   Alexandro Bernice FORBES, [name]   cetirizine  (ZYRTEC ) 10 MG tablet Take 1 tablet by mouth daily  Patient not taking: Reported on 03/26/2024 05/22/22   Alexandro Bernice FORBES, [name]   levothyroxine  (SYNTHROID ) 75 MCG tablet TAKE ONE TABLET BY MOUTH  DAILY  Patient not taking: Reported on 03/27/2024 05/22/22   Alexandro Bernice BRAVO, [name]   montelukast  (SINGULAIR ) 10 MG tablet Take 1 tablet by mouth daily  Patient not taking: Reported on 03/26/2024 05/22/22   Osterbrock, Erika E, [name]   furosemide  (LASIX ) 20 MG tablet Take 1 tablet by mouth daily as needed (swelling)  Patient not taking: Reported on 03/27/2024 05/22/22   Osterbrock, Erika E, [name]   ibuprofen  (ADVIL ;MOTRIN ) 600 MG tablet Take 1 tablet by mouth every 6 hours as needed for Pain 02/01/22 11/25/23  Foley, Chauncey Sharper, MD   acetaminophen  (TYLENOL ) 500 MG tablet Take 1 tablet by mouth 4 times daily as needed for Pain 02/01/22 11/25/23  Foley, Chauncey Sharper, MD   Continuous Blood Gluc Sensor (FREESTYLE LIBRE 2 SENSOR) MISC USE TO TEST BLOOD GLUCOSE AS DIRECTED  Patient not taking: Reported on 03/27/2024 12/25/21   Alexandro Bernice BRAVO, [name]   letrozole (FEMARA)  2.5 MG tablet  06/06/21   [provider]   losartan  (COZAAR ) 25 MG tablet TAKE ONE TABLET BY MOUTH DAILY  Patient not taking: Reported on 03/26/2024 07/05/21   Osterbrock, Erika E, [name]   Insulin  Pen Needle 32G X 6 MM MISC Use four times daily and as needed  Patient not taking: Reported on 03/27/2024 06/12/21   Alexandro Bernice BRAVO, [name]   EPINEPHrine (EPIPEN) 0.3 MG/0.3ML SOAJ injection  04/05/21   [provider]   blood glucose monitor strips Tes2t qam  times a day & as needed for symptoms of irregular blood glucose. Please provide test strips approved by insurance.  Patient not taking: Reported on 03/27/2024 12/29/19   Alexandro Bernice BRAVO, [name]   albuterol  sulfate HFA 108 (90 Base) MCG/ACT inhaler Inhale 2 puffs into the lungs every 6 hours as needed for Wheezing or Shortness of Breath 04/26/19   Alexandro Bernice BRAVO, [name]   Insulin  Pen Needle ([employer] PEN NEEDLES 31G) 31G X 8 MM MISC 1 each by Does not apply route daily  Patient not taking: Reported on 03/27/2024 02/10/19   Alexandro Bernice BRAVO, [name]   valACYclovir  (VALTREX ) 500 MG tablet TAKE 1 TABLET BY MOUTH DAILY  Patient not taking: Reported on 11/15/2023 03/03/18   Seena JAYSON Eck, MD   Lancets MISC 1 each by Does not apply route daily Please provide lancets approved by insurance.  Patient not taking: Reported on 03/27/2024 02/16/18   Tussey, Rocio G, MD       Current medications:    Current Facility-Administered Medications   Medication Dose Route Frequency Provider Last Rate Last Admin    HYDROmorphone  HCl PF (DILAUDID ) injection 0.25 mg  0.25 mg IntraVENous Q4H PRN Arne Honour A, MD   0.25 mg at 04/02/24 1039    potassium chloride  (KLOR-CON  M) extended release tablet 40 mEq  40 mEq Oral PRN Arne Honour A, MD   40 mEq at 04/02/24 9083    Or    potassium bicarb-citric acid  (EFFER-K ) effervescent tablet 40 mEq  40 mEq Oral PRN Arne Honour A, MD        Or    potassium chloride  10 mEq/100 mL IVPB (Peripheral  Line)  10 mEq IntraVENous PRN Arne Honour A, MD 100 mL/hr at 04/01/24 1748 10 mEq at 04/01/24 1748    sodium chloride  flush 0.9 % injection 5-40 mL  5-40 mL IntraVENous 2 times per day He, Peimei, MD  10 mL at 04/01/24 2127    sodium chloride  flush 0.9 % injection 5-40 mL  5-40 mL IntraVENous PRN He, Peimei, MD        0.9 % sodium chloride  infusion   IntraVENous PRN He, Peimei, MD        lidocaine  PF 1 % injection 50 mg  50 mg IntraDERmal Once He, Peimei, MD        insulin  glargine (LANTUS ) injection vial 15 Units  15 Units SubCUTAneous Nightly Arne Honour A, MD   15 Units at 04/01/24 2117    oxyCODONE -acetaminophen  (PERCOCET ) 5-325 MG per tablet 1 tablet  1 tablet Oral Q4H PRN Arne Honour A, MD   1 tablet at 04/02/24 0916    0.9 % sodium chloride  infusion   IntraVENous PRN Arne Honour LABOR, MD        insulin  lispro (HUMALOG ,ADMELOG ) injection vial 0-8 Units  0-8 Units SubCUTAneous 4x Daily AC & HS Roetta Grass, MD   2 Units at 04/01/24 1123    pantoprazole  (PROTONIX ) tablet 40 mg  40 mg Oral QAM AC Rodriguez Galliano, Luis M, MD   40 mg at 04/02/24 9476    nafcillin  2,000 mg in sodium chloride  0.9 % 100 mL IVPB (addEASE)  2,000 mg IntraVENous Q4H Naqvi, Syed Ali Abbas, MD   Stopped at 04/02/24 1042    levothyroxine  (SYNTHROID ) tablet 25 mcg  25 mcg Oral Daily Arnie Duverney, MD   25 mcg at 04/02/24 0523    insulin  lispro (HUMALOG ,ADMELOG ) injection vial 7 Units  7 Units SubCUTAneous TID WC Roetta Grass, MD   7 Units at 04/02/24 9082    dextrose  bolus 10% 125 mL  125 mL IntraVENous PRN Judeen Meek, MD        Or    dextrose  bolus 10% 250 mL  250 mL IntraVENous PRN Judeen Meek, MD        magnesium  sulfate 2000 mg in 50 mL IVPB premix  2,000 mg IntraVENous PRN Judeen Meek, MD        sodium phosphate  15 mmol in sodium chloride  0.9 % 250 mL IVPB  15 mmol IntraVENous PRN Judeen Meek, MD   Stopped at 03/29/24 0329    magnesium  sulfate 2000 mg in 50 mL IVPB premix  2,000 mg  IntraVENous PRN Judeen Meek, MD   Stopped at 03/27/24 1612    [Held by provider] enoxaparin  (LOVENOX ) injection 40 mg  40 mg SubCUTAneous Daily Judeen Meek, MD   40 mg at 03/29/24 0912    ondansetron  (ZOFRAN -ODT) disintegrating tablet 4 mg  4 mg Oral Q8H PRN Judeen Meek, MD        Or    ondansetron  (ZOFRAN ) injection 4 mg  4 mg IntraVENous Q6H PRN Judeen Meek, MD        polyethylene glycol (GLYCOLAX ) packet 17 g  17 g Oral Daily PRN Judeen Meek, MD        acetaminophen  (TYLENOL ) tablet 650 mg  650 mg Oral Q6H PRN Bodavula, Girisha, MD   650 mg at 04/01/24 2119    Or    acetaminophen  (TYLENOL ) suppository 650 mg  650 mg Rectal Q6H PRN Judeen Meek, MD        dextrose  5 % and 0.45 % sodium chloride  infusion   IntraVENous Continuous PRN Judeen Meek, MD   Stopped at 03/28/24 2303    glucose chewable tablet 16 g  4 tablet Oral PRN Judeen Meek, MD        glucagon  injection 1 mg  1 mg SubCUTAneous PRN Judeen Meek, MD        dextrose  10 % infusion   IntraVENous Continuous PRN Bodavula, Girisha, MD        albuterol  (PROVENTIL ) (2.5 MG/3ML) 0.083% nebulizer solution 2.5 mg  2.5 mg Nebulization Q6H PRN Judeen Meek, MD           Allergies:    Allergies   Allergen Reactions    Ace Inhibitors Swelling    Lisinopril Other (See Comments)     Swollen lips    Peanuts [Peanut Oil]      THROAT ITCHES  Walnuts  PEANUTS     Walnut        Problem List:    Patient Active Problem List   Diagnosis Code    Mixed hyperlipidemia E78.2    Iron  deficiency anemia D50.9    Type 2 diabetes mellitus with hyperglycemia, with long-term current use of insulin  (HCC) E11.65, Z79.4    Essential hypertension I10    Acquired hypothyroidism E03.9    Mild intermittent asthma without complication J45.20    Allergic rhinitis J30.9    Heart murmur R01.1    Uterine fibroid D25.9    Class 1 obesity due to excess calories with serious comorbidity and body mass index (BMI) of 31.0 to 31.9 in adult E66.811,  E66.09, Z68.31    Abnormal uterine bleeding N93.9    Eczema L30.9    Glaucoma suspect, bilateral H40.003    Hidradenitis axillaris L73.2    HSV-2 infection B00.9    Myopic astigmatism, bilateral H52.203, H52.13    Strabismus H50.9    S/P myomectomy Z98.890    DKA, type 2, not at goal [facility]) E11.10    Acute nonintractable headache R51.9    MSSA bacteremia R78.81, B95.61    Diabetic ketoacidosis without coma associated with type 2 diabetes mellitus (HCC) E11.10    Left hip pain M25.552    Abscess of bursa of left hip M71.052       Past Medical History:        Diagnosis Date    Acute headache     Allergies     Asthma     Diabetes mellitus (HCC)     Genital herpes     HTN (hypertension)     Hyperlipidemia     Hypothyroid     Urticaria        Past Surgical History:        Procedure Laterality Date    CT FLUID COLLECTION DRAINAGE SOFT TISSUE  04/01/2024    CT FLUID COLLECTION DRAINAGE SOFT TISSUE 04/01/2024 [facility] CT SCAN    CYST REMOVAL      HYSTEROSCOPY      x2    MYOMECTOMY N/A 02/01/2022    ROBOTIC MYOMECTOMY performed by Foley, Chauncey Sharper, MD at [facility] OR       Social History:    Social History     Tobacco Use    Smoking status: Never    Smokeless tobacco: Never   Substance Use Topics    Alcohol use: No                                Counseling given: Not Answered      Vital Signs (Current):   Vitals:    04/02/24 0916 04/02/24 1039 04/02/24 1225 04/02/24 1306   BP:   117/67 125/71   Pulse:   (!) 113 (!) 110  Resp: 18 16 18 16    Temp:   98.4 F (36.9 C) 99.2 F (37.3 C)   TempSrc:   Oral Oral   SpO2:   100% 98%   Weight:       Height:                                                  BP Readings from Last 3 Encounters:   04/02/24 125/71   11/25/23 110/72   11/15/23 136/76       NPO Status: Time of last liquid consumption: 2359                        Time of last solid consumption: 2359                        Date of last liquid consumption: 04/01/24                        Date of last solid food consumption:  04/01/24    BMI:   Wt Readings from Last 3 Encounters:   04/02/24 88.3 kg (194 lb 10.7 oz)   11/25/23 82.1 kg (181 lb)   11/15/23 85.1 kg (187 lb 9.6 oz)     Body mass index is 33.41 kg/m.    CBC:   Lab Results   Component Value Date/Time    WBC 16.7 04/02/2024 06:56 AM    RBC 2.78 04/02/2024 06:56 AM    HGB 7.0 04/02/2024 06:56 AM    HCT 22.0 04/02/2024 06:56 AM    MCV 79.2 04/02/2024 06:56 AM    RDW 17.0 04/02/2024 06:56 AM    PLT 583 04/02/2024 06:56 AM       CMP:   Lab Results   Component Value Date/Time    NA 137 04/02/2024 06:56 AM    K 3.2 04/02/2024 06:56 AM    CL 98 04/02/2024 06:56 AM    CO2 28 04/02/2024 06:56 AM    BUN 4 04/02/2024 06:56 AM    CREATININE 0.5 04/02/2024 06:56 AM    GFRAA >60 08/11/2019 12:18 PM    AGRATIO 0.8 11/25/2023 02:44 PM    LABGLOM >90 04/02/2024 06:56 AM    LABGLOM >60 02/02/2022 05:10 AM    GLUCOSE 105 04/02/2024 06:56 AM    CALCIUM  8.2 04/02/2024 06:56 AM    BILITOT 0.7 11/25/2023 02:44 PM    ALKPHOS 154 11/25/2023 02:44 PM    AST 15 11/25/2023 02:44 PM    ALT 16 11/25/2023 02:44 PM       POC Tests:   Recent Labs     04/02/24  1114   POCGLU 87       Coags: No results found for: PROTIME, INR, APTT    HCG (If Applicable):   Lab Results   Component Value Date    PREGTESTUR Negative 11/15/2023    PREGSERUM Negative 04/02/2024        ABGs: No results found for: PHART, PO2ART, PCO2ART, HCO3ART, BEART, O2SATART     Type & Screen (If Applicable):  Lab Results   Component Value Date    ABORH B POS 03/29/2024    LABANTI NEG 03/29/2024       Drug/Infectious Status (If Applicable):  No results found for: HIV,  HEPCAB    COVID-19 Screening (If Applicable):   Lab Results   Component Value Date/Time    COVID19 NOT DETECTED 03/27/2024 04:07 PM    COVID19 DETECTED 04/22/2019 02:15 PM           Anesthesia Evaluation    Patient summary reviewed     no history of anesthetic complications:  Airway:  Mallampati: III  TM distance: >3 FB   Neck ROM: full    Mouth opening: > = 3  FB   Dental:  normal exam         Pulmonary:     breath sounds clear to auscultation  (+)       asthma (hasn't needed inhaler in years):                           (-) rhonchi, wheezes, not a current smoker and no decreased breath sounds         Cardiovascular:     Exercise tolerance: good (>4 METS)    (+)     hypertension:                                        (-) past MI and murmur      Rhythm: regular  Rate: normal                Neuro/Psych:       (-) seizures, TIA and CVA          GI/Hepatic/Renal:    (+)           obesity:     (-) liver disease and no renal disease     Endo/Other:     (+) Diabetes: Type II DM, well controlled, using insulin   :  blood dyscrasia: anemia  :                     Abdominal:    (+) obese            Vascular:         Other Findings:            Anesthesia Plan      general     ASA 3       Induction: intravenous.      Anesthetic plan and risks discussed with patient.    Use of blood products discussed with patient whom consented to blood products.    Plan discussed with CRNA.    Attending anesthesiologist reviewed and agrees with Preprocedure content                Lonni ORN Kadince Boxley, DO   04/02/2024            "

## 2024-04-02 NOTE — Anesthesia Postprocedure Evaluation (Signed)
"  Department of Anesthesiology  Postprocedure Note    Patient: YATZIL CLIPPINGER  MRN: [id number]  Birthdate: 1986/04/15  Date of evaluation: 04/02/2024    Procedure Summary       Date: 04/02/24 Room / Location: [facility] OR 03 / [facility]    Anesthesia Start: 1341 Anesthesia Stop: 1439    Procedure: INCISION AND DRAINAGE OF LEFT HIP GREATER TROCHANTERIC BURSA (Left) Diagnosis:       Abscess of bursa of left hip      (Abscess of bursa of left hip [M71.052])    Surgeons: Sherline Lamar Gaskins, MD Responsible Provider: Lary Lonni ORN, DO    Anesthesia Type: general ASA Status: 3            Anesthesia Type: No value filed.    Aldrete Phase I: Aldrete Score: 9    Aldrete Phase II:      Anesthesia Post Evaluation    Patient location during evaluation: PACU  Patient participation: complete - patient participated  Level of consciousness: awake  Pain score: 2  Airway patency: patent  Cardiovascular status: blood pressure returned to baseline  Respiratory status: acceptable  Hydration status: euvolemic  Pain management: adequate      No notable events documented.  "

## 2024-04-02 NOTE — Plan of Care (Signed)
"    Problem: Infection - Adult  Goal: Absence of infection at discharge  Outcome: Progressing  Flowsheets (Taken 04/02/2024 0230)  Absence of infection at discharge:   Assess and monitor for signs and symptoms of infection   Monitor lab/diagnostic results   Monitor all insertion sites i.e., indwelling lines, tubes and drains   Monitor endotracheal (as able) and nasal secretions for changes in amount and color   Institute appropriate cooling/warming therapies per order   Administer medications as ordered   Instruct and encourage patient and family to use good hand hygiene technique   Identify and instruct in appropriate isolation precautions for identified infection/condition  Note: Monitoring for s/s of worsening infection/pt getting Q4 antibiotics     Problem: Pain  Goal: Verbalizes/displays adequate comfort level or baseline comfort level  Outcome: Progressing  Flowsheets (Taken 04/02/2024 0230)  Verbalizes/displays adequate comfort level or baseline comfort level:   Encourage patient to monitor pain and request assistance   Assess pain using appropriate pain scale   Administer analgesics based on type and severity of pain and evaluate response   Consider cultural and social influences on pain and pain management   Implement non-pharmacological measures as appropriate and evaluate response   Notify Licensed Independent Practitioner if interventions unsuccessful or patient reports new pain  Note: Pain managed per [facility]     Problem: Safety - Adult  Goal: Free from fall injury  Outcome: Progressing  Flowsheets (Taken 04/02/2024 0230)  Free From Fall Injury:   Instruct family/caregiver on patient safety   Based on caregiver fall risk screen, instruct family/caregiver to ask for assistance with transferring infant if caregiver noted to have fall risk factors  Note: All fall precautions in place     "

## 2024-04-02 NOTE — Flowsheet Note (Signed)
"  Second unit of blood started. Pt resting in bed with eyes closed.   "

## 2024-04-02 NOTE — Flowsheet Note (Signed)
"  5 tower RN called to come down for report.   "

## 2024-04-02 NOTE — Flowsheet Note (Signed)
"  Report given to [employer] at bedside.   "

## 2024-04-02 NOTE — Flowsheet Note (Signed)
"  Tried to call WR no answer.   "

## 2024-04-02 NOTE — Progress Notes (Signed)
"  Pt A&Ox4 w/ stable vitals and assessments throughout the day & postop. Report was given by previous RN to this RN and again to this RN downstairs in PACU.     Pt uses 0-10 pain scale appropriately. see MAR for all med admin. Pt not complaining of pain at this time.     Patient with standard safety measures in place. All personal items within reach. Hourly rounding performed. All needs met. Bed in lowest position. Nonskid socks on. Call light in reach. Bed alarm on.     Pt has voided postop but has yet to ambulate. Pt used bedpan, as she didn't feel comfortable to walk to bathroom yet.   Electronically signed by Hudson Jewell, RN on 04/02/2024 at 6:52 PM        "

## 2024-04-02 NOTE — Progress Notes (Signed)
"  PACU Transfer Note    Vitals:    04/02/24 1610   BP: (!) 147/88   Pulse: (!) 121   Resp: 21   Temp: 98.5 F (36.9 C)   SpO2: 100%       In: 1770 [P.O.:120; I.V.:1300]  Out: -     Pain assessment:  none  Pain Level: 2    Report given to Receiving unit RN.    04/02/2024 4:17 PM      "

## 2024-04-02 NOTE — Flowsheet Note (Signed)
"  Writer was able to get a hold of pt sister and made aware pt will be transferring back to room.   "

## 2024-04-02 NOTE — Progress Notes (Signed)
"  Patient admitted to PACU # 17 from OR at 1435 post INCISION AND DRAINAGE OF LEFT HIP GREATER TROCHANTERIC BURSA - Left  per Dr. Sherline.  Attached to PACU monitoring system and report received from anesthesia provider.  Patient was reported to be hemodynamically stable during procedure.  Patient drowsy on admission and denied pain. Pt arrived to pacu ST on monitor. L hip surgical drsg c/d/I with medipore tape and accordian drain compressed. BS 100. Will continue to monitor.   "

## 2024-04-02 NOTE — Flowsheet Note (Signed)
"  Order to transfuse blood. Blood bank called and said they are preparing unit.   "

## 2024-04-02 NOTE — Flowsheet Note (Signed)
"  Tried to call pt sister, No answer.   "

## 2024-04-02 NOTE — Op Note (Signed)
"     Procedure Note        Shelly Richard  04/02/2024    Pre-operative Diagnosis:Left Hip Septic Greater Trochanteric Bursitis     Post-operative Diagnosis: Same    Procedure: Incision and Drainage Left Hip Greater Trochanter Bursa    Surgeon: LAMAR GRETTA BIDDING, MD    Anesthesia:  General    Complications:  None    EBL: Less than 50 ml    INDICATIONS FOR PROCEDURE: The patient has clinical  evidence of symptomatic bacteremia infection and likely abscess at the left hip greater trochanter bursa and has failed appropriate conservative management. Radiology guided aspiration revealed likely staph aureus. The patient understands the relevant risks, benefits, limitations,chance of recurrence or repeat procedure, and the healing process of the procedure.     PROCEDURE: The patient was brought to the operating room and anesthetized with general anesthesia.  The identified and marked extremity was then prepped and draped in a standard fashion.      A standard longitudinal incision was made over the left lateral hip at the affected area.  Dissection carried down through skin and subcutaneous tissue with careful attention paid to hemostasis and protection of crossing cutaneous nerve branches. At the anterior portion of the greater trochanteric bursa a pocket of sanguinous dark fluid was encountered. This correlated with the MRI and radiology guided aspiration region. Any fluid was evacuated and cultured with aerobic, anaerobic, and gram stain studies. The area was thoroughly lavaged with sterile saline. IV Ancef  was then given    Further inspection showed satisfactory soft tissue bed and no further deep purulence or additional extension. There was no obvious tracking deep to the trochanteric bursa.     A medium Hemovac was placed within the wound at the site of the abscess. Skin was closed with nylon suture and then infiltrated with 0.5% plain marcaine .  A post-op absorbent dressing was applied and the patient planned for  transport to the recovery area postoperatively.          LAMAR GRETTA BIDDING, MD   "

## 2024-04-02 NOTE — Progress Notes (Signed)
"  Pt declined PICC line at this time. She has another procedure today and would like to get the PICC on Monday instead.  "

## 2024-04-02 NOTE — Plan of Care (Signed)
"    Problem: Discharge Planning  Goal: Discharge to home or other facility with appropriate resources  Outcome: Progressing  Flowsheets (Taken 04/02/2024 1422)  Discharge to home or other facility with appropriate resources:   Identify barriers to discharge with patient and caregiver   Identify discharge learning needs (meds, wound care, etc)   Refer to discharge planning if patient needs post-hospital services based on physician order or complex needs related to functional status, cognitive ability or social support system   Arrange for needed discharge resources and transportation as appropriate  Note: Patient updated and educated on barriers to discharge and plan of care.      Problem: Chronic Conditions and Co-morbidities  Goal: Patient's chronic conditions and co-morbidity symptoms are monitored and maintained or improved  Outcome: Progressing  Flowsheets (Taken 04/02/2024 1422)  Care Plan - Patient's Chronic Conditions and Co-Morbidity Symptoms are Monitored and Maintained or Improved:   Monitor and assess patient's chronic conditions and comorbid symptoms for stability, deterioration, or improvement   Collaborate with multidisciplinary team to address chronic and comorbid conditions and prevent exacerbation or deterioration   Update acute care plan with appropriate goals if chronic or comorbid symptoms are exacerbated and prevent overall improvement and discharge     Problem: Safety - Adult  Goal: Free from fall injury  04/02/2024 1422 by Hendrik Donath, RN  Outcome: Progressing  Flowsheets (Taken 04/02/2024 1422)  Free From Fall Injury:   Instruct family/caregiver on patient safety   Based on caregiver fall risk screen, instruct family/caregiver to ask for assistance with transferring infant if caregiver noted to have fall risk factors  Note: Patient has remained free of fall injury this shift.      "

## 2024-04-03 LAB — POCT GLUCOSE
POC Glucose: 218 mg/dL — ABNORMAL HIGH (ref 70–99)
POC Glucose: 328 mg/dL — ABNORMAL HIGH (ref 70–99)
POC Glucose: 227 mg/dL — ABNORMAL HIGH (ref 70–99)
POC Glucose: 272 mg/dL — ABNORMAL HIGH (ref 70–99)

## 2024-04-03 LAB — CBC WITH AUTO DIFFERENTIAL
Basophils %: 0.3 %
Basophils Absolute: 0 K/uL (ref 0.0–0.2)
Eosinophils %: 0.5 %
Eosinophils Absolute: 0.1 K/uL (ref 0.0–0.6)
Hematocrit: 27.7 % — ABNORMAL LOW (ref 36.0–48.0)
Hemoglobin: 9.2 g/dL — ABNORMAL LOW (ref 12.0–16.0)
Lymphocytes %: 14 %
Lymphocytes Absolute: 2.2 K/uL (ref 1.0–5.1)
MCH: 26.7 pg (ref 26.0–34.0)
MCHC: 33.1 g/dL (ref 31.0–36.0)
MCV: 80.6 fL (ref 80.0–100.0)
MPV: 8.2 fL (ref 5.0–10.5)
Monocytes %: 7.2 %
Monocytes Absolute: 1.1 K/uL (ref 0.0–1.3)
Neutrophils %: 78 %
Neutrophils Absolute: 12.4 K/uL — ABNORMAL HIGH (ref 1.7–7.7)
Platelets: 610 K/uL — ABNORMAL HIGH (ref 135–450)
RBC: 3.44 M/uL — ABNORMAL LOW (ref 4.00–5.20)
RDW: 17 % — ABNORMAL HIGH (ref 12.4–15.4)
WBC: 15.9 K/uL — ABNORMAL HIGH (ref 4.0–11.0)

## 2024-04-03 LAB — CULTURE, BLOOD 2: Culture, Blood 2: NO GROWTH

## 2024-04-03 MED ORDER — INSULIN LISPRO 100 UNIT/ML IJ SOLN
100 | Freq: Three times a day (TID) | INTRAMUSCULAR | Status: DC
Start: 2024-04-03 — End: 2024-04-04
  Administered 2024-04-03 – 2024-04-04 (×3): 10 [IU] via SUBCUTANEOUS

## 2024-04-03 MED ORDER — INSULIN GLARGINE 100 UNIT/ML SC SOLN
100 | Freq: Every evening | SUBCUTANEOUS | Status: DC
Start: 2024-04-03 — End: 2024-04-04
  Administered 2024-04-04: 02:00:00 30 [IU] via SUBCUTANEOUS

## 2024-04-03 MED FILL — INSULIN LISPRO 100 UNIT/ML IJ SOLN: 100 [IU]/mL | INTRAMUSCULAR | Qty: 4 | Fill #0

## 2024-04-03 MED FILL — ASPIRIN LOW DOSE 81 MG PO TBEC: 81 mg | ORAL | Qty: 1 | Fill #0

## 2024-04-03 MED FILL — OXYCODONE HCL 5 MG PO TABS: 5 mg | ORAL | Qty: 2 | Fill #0

## 2024-04-03 MED FILL — NAFCILLIN SODIUM 2 G IJ SOLR: 2 g | INTRAMUSCULAR | Qty: 2000 | Fill #0

## 2024-04-03 MED FILL — MONTELUKAST SODIUM 10 MG PO TABS: 10 mg | ORAL | Qty: 1 | Fill #0

## 2024-04-03 MED FILL — INSULIN LISPRO 100 UNIT/ML IJ SOLN: 100 [IU]/mL | INTRAMUSCULAR | Qty: 2 | Fill #0

## 2024-04-03 MED FILL — METHOCARBAMOL 750 MG PO TABS: 750 mg | ORAL | Qty: 1 | Fill #0

## 2024-04-03 MED FILL — HYDROMORPHONE HCL 1 MG/ML IJ SOLN: 1 mg/mL | INTRAMUSCULAR | Qty: 1 | Fill #0

## 2024-04-03 MED FILL — INSULIN LISPRO 100 UNIT/ML IJ SOLN: 100 [IU]/mL | INTRAMUSCULAR | Qty: 10 | Fill #0

## 2024-04-03 MED FILL — INSULIN LISPRO 100 UNIT/ML IJ SOLN: 100 [IU]/mL | INTRAMUSCULAR | Qty: 6 | Fill #0

## 2024-04-03 MED FILL — VALACYCLOVIR HCL 500 MG PO TABS: 500 mg | ORAL | Qty: 1 | Fill #0

## 2024-04-03 MED FILL — LANTUS 100 UNIT/ML SC SOLN: 100 [IU]/mL | SUBCUTANEOUS | Qty: 15 | Fill #0

## 2024-04-03 MED FILL — INSULIN LISPRO 100 UNIT/ML IJ SOLN: 100 [IU]/mL | INTRAMUSCULAR | Qty: 7 | Fill #0

## 2024-04-03 MED FILL — PANTOPRAZOLE SODIUM 40 MG PO TBEC: 40 mg | ORAL | Qty: 1 | Fill #0

## 2024-04-03 MED FILL — CETIRIZINE HCL 10 MG PO TABS: 10 mg | ORAL | Qty: 1 | Fill #0

## 2024-04-03 MED FILL — ACETAMINOPHEN 325 MG PO TABS: 325 mg | ORAL | Qty: 2 | Fill #0

## 2024-04-03 MED FILL — FERROUS SULFATE 325 (65 FE) MG PO TABS: 325 (65 Fe) MG | ORAL | Qty: 1 | Fill #0

## 2024-04-03 MED FILL — LEVOTHYROXINE SODIUM 25 MCG PO TABS: 25 ug | ORAL | Qty: 1 | Fill #0

## 2024-04-03 NOTE — Progress Notes (Signed)
 "    V2.0    USACS Progress Note      Name:  Shelly Richard DOB/Age/Sex: [birth year]  (38 y.o. female)   MRN & CSN:  [id number] & [id number] Encounter Date/Time: 04/03/2024 12:21 PM EST   Location:  5514/5514-01 PCP: Alexandro Bernice FORBES, [name]     Attending:Cheng Dec, Marne LABOR, MD       Hospital Day: 9  Subjective:     Chief Complaint: Sepsis    Shelly Richard is a 38 y.o. female who presents with sepsis    Subjective:  Patient feels a little better.  Appetite improving  Low-grade temp    Blood sugars running high  Assessment and Recommendations   HPI per admitting provider :      Hospital course:    Shelly Richard is a 38 y.o. female with pmh of diabetes, hypertension, hyperlipidemia who presents with DKA, type 2, not at goal [facility]) with sepsis.  Patient has not been taking her medications due to insurance issues.  DKA was treated with an insulin  drip and now switched to subcutaneous insulin .  Workup revealed MSSA bacteremia.  Patient CT scan showed some fluid around the hip MRI confirmed possible trochanteric bursal infection.  Underwent aspiration on 12/18, culture grew staph      Plan:     DKA resolved    Uncontrolled type 2 diabetes-patient's blood sugars running high but patient was n.p.o. yesterday and insulin  dose was decreased.  Will increase Lantus  to 30 and increase Premeal insulin   - On Lantus  with Premeal insulin   - On sliding scale insulin   - As patient is on insulin  monitor blood sugars hypoglycemia    Sepsis-white count still elevated trending down, temperature curve coming down  - Present on admission  - Still spiking fever    MSSA bacteremia-  - Was initially on vancomycin  switched to nafcillin  on 12/15  - Repeat blood culture pending positive on 12/15, repeat blood culture on 12/16 negative  - TEE normal EF no vegetation      Acute on chronic anemia-posttransfusion hemoglobin stable around 7  -Hemoglobin dropped from 10.9 to 5.7  -Iron  studies shows iron  deficiency but with bacteremia will  hold off IV iron   - In the past patient needed iron  transfusions  - In the past patient had a lot of menstrual bleeding and was on Femara for fibroid    Hypothyroidism  -Resume low-dose Levothyroid    Hip pain-with septic bursitis-monitor drain output  -s/p I&D on 12/19 culture positive for staph  - CT shows some fluid collection  - MRI done on 12/17 shows a 4.6 cm left greater trochanteric bursal fluid collection which tracks into the gluteus muscle with surrounding edema concern for septic bursitis/abscess  - Culture positive for staph            I spent _   minutes in the care of this patient.  Over 50% of that time was in face-to-face counseling regarding disease process, diagnostic testing, preventative measures, and answering patient and family questions.     Diet ADULT DIET; Regular; 3 carb choices (45 gm/meal)   DVT Prophylaxis []  Lovenox , []   Heparin, []  SCDs, []  Ambulation,  []  Eliquis, []  Xarelto  []  Coumadin   Code Status Full Code   Disposition   Unclear     Surrogate Decision Maker/ POA Spouse       MDM  [x]  High (any 2 of A, B, or C)    A.  Problems (any 1)  [x]  Acute/Chronic Illness/injury posing threat to life or bodily function:    []  Severe exacerbation of chronic illness:    ---------------------------------------------------------------------  B. Risk of Treatment (any 1)   []  IV ABX requiring serial renal monitoring for nephrotoxicity:     [x]  IV Narcotic analgesia for adverse drug reaction  []  IV diuresis requiring serial monitoring for renal impairment and electrolyte derangements  []  Critical electrolyte abnormalities requiring IV replacement and close serial monitoring  [x]  Insulin  - monitoring serial FSBS for Hypoglycemic adverse drug reaction  []  Anticoagulation requiring serial monitoring of coagulation factors  []  IV/IM Controlled Substances order (any 1)   []  One-time Order including Drug Name/Route, Reason Ordered:   []  Scheduled Order including Drug Name/Route, Reason Ordered or  Continued:   []  PRN Order including Drug Name/Route, Reason Ordered or Continued:  [] Other -   []  Decision to De-escalate Care this DOS due to change in treatment goals:  []  Decision to Escalate Care To:   []  Major Surgery/Procedure (any 1):   Elective with patient risk factors including Procedure Type and Risk Factors:   Emergent Procedure Type:    ----------------------------------------------------------------------  C. Data (any 2)  []  Data Review (3+ points)  []  Consultant Note reviewed with note date, specialty, and summary (1 point each)  [x]  All current labs were reviewed and interpreted for clinical significance   []  Studies Reviewed (1 point each):   []  Collateral history obtained including from who and why needed (Max 1 point):  []  Independent (Osinachi Navarrette DELENA Creighton, MD) Interpretation of tests (any 1)  []  Rhythm Strip (Telemetry) personally reviewed and interpreted as documented above    []  Imaging personally reviewed and interpreted, includes:    [x]  Discussion (any 1)  [x]  Discussed the discharge plan in detail with case management including timing/barriers to discharge, need for support services and/or placement decision   []  Discussed management of the case with:         Review of Systems:      Pertinent positives and negatives discussed in HPI    Objective:     Intake/Output Summary (Last 24 hours) at 04/03/2024 0929  Last data filed at 04/03/2024 0501  Gross per 24 hour   Intake 2250 ml   Output 30 ml   Net 2220 ml        Vitals:   Vitals:    04/03/24 0415 04/03/24 0600 04/03/24 0845 04/03/24 0857   BP: 137/77  138/74    Pulse: (!) 106  (!) 115    Resp: 16  16 16    Temp: 98.4 F (36.9 C)  99.9 F (37.7 C)    TempSrc: Oral  Oral    SpO2: 97%  97%    Weight:  97.6 kg (215 lb 2.7 oz)     Height:             Physical Exam:      General: NAD  Eyes: EOMI  ENT: neck supple  Cardiovascular: Regular rate.  Respiratory: Clear to auscultation  Gastrointestinal: Soft, non tender  Genitourinary: no suprapubic  tenderness  Musculoskeletal: No edema  Skin: warm, dry  Neuro: Alert.  Psych: Mood appropriate.         Medications:   Medications:    insulin  glargine  30 Units SubCUTAneous Nightly    insulin  lispro  10 Units SubCUTAneous TID WC    [Held by provider] valACYclovir   500 mg Oral Daily    cetirizine   10 mg  Oral Daily    montelukast   10 mg Oral Daily    ferrous sulfate   325 mg Oral Daily with breakfast    sodium chloride  flush  5-40 mL IntraVENous 2 times per day    aspirin   81 mg Oral BID    sodium chloride  flush  5-40 mL IntraVENous 2 times per day    insulin  lispro  0-8 Units SubCUTAneous 4x Daily AC & HS    pantoprazole   40 mg Oral QAM AC    nafcillin   2,000 mg IntraVENous Q4H    levothyroxine   25 mcg Oral Daily      Infusions:    sodium chloride       sodium chloride       sodium chloride       sodium chloride       dextrose  5 % and 0.45 % NaCl Stopped (03/28/24 2303)    dextrose        PRN Meds: furosemide , 20 mg, Daily PRN  sodium chloride  flush, 5-40 mL, PRN  sodium chloride , , PRN  acetaminophen , 650 mg, Q4H PRN  oxyCODONE , 5 mg, Q4H PRN   Or  oxyCODONE , 10 mg, Q4H PRN  HYDROmorphone , 0.25 mg, Q3H PRN   Or  HYDROmorphone , 0.5 mg, Q3H PRN  methocarbamol , 750 mg, Q8H PRN   Or  methocarbamol  (ROBAXIN ) IVPB, 1,000 mg, Q8H PRN  bisacodyl , 5 mg, Daily PRN  ondansetron , 4 mg, Q8H PRN   Or  ondansetron , 4 mg, Q6H PRN  sodium chloride , , PRN  potassium chloride , 40 mEq, PRN   Or  potassium alternative oral replacement, 40 mEq, PRN   Or  potassium chloride , 10 mEq, PRN  sodium chloride  flush, 5-40 mL, PRN  sodium chloride , , PRN  sodium chloride , , PRN  dextrose  bolus, 125 mL, PRN   Or  dextrose  bolus, 250 mL, PRN  magnesium  sulfate, 2,000 mg, PRN  sodium phosphate  15 mmol in sodium chloride  0.9 % 250 mL IVPB, 15 mmol, PRN  magnesium  sulfate, 2,000 mg, PRN  polyethylene glycol, 17 g, Daily PRN  dextrose  5 % and 0.45 % NaCl, , Continuous PRN  glucose, 4 tablet, PRN  glucagon , 1 mg, PRN  dextrose , , Continuous PRN  albuterol ,  2.5 mg, Q6H PRN        Labs and Imaging   CT SOFT TISSUE NECK WO CONTRAST  Result Date: 03/28/2024  DATE: March 28, 2024 EXAM: CT SOFT TISSUE NECK WO CONTRAST INDICATION: neck pain, fever; COMPARISON: None. TECHNIQUE: Axial CT imaging obtained from the skull base through the lung apices. Axial images and multiplanar reformatted images reviewed.   Up-to-date CT equipment and radiation dose reduction techniques were employed. IV Contrast: None FINDINGS: SCOUT: Nondiagnostic scout image(s) obtained for localization purposes, not for diagnostic use DEVICES/HARDWARE: None . ORBITS, PARANASAL SINUSES, SKULL BASE: Limited images of the base of the brain demonstrate no mass effect. Orbits are normal. Imaged sinuses are clear. CERVICAL SPINE/BONES: No destructive process. NASAL CAVITY/NASOPHARYNX: No mass. ORAL CAVITY/OROPHARYNX: No evidence of mass. HYPOPHARYNX AND LARYNX: Normal. DEEP NECK SPACES: The parapharyngeal, retropharyngeal and masticator spaces are normal. SALIVARY GLANDS: Submandibular glands, sublingual glands and parotid glands are normal. THYROID GLAND: Normal. LYMPH NODES: Nonenlarged cervical lymph nodes bilaterally. THORACIC INLET: No mass or acute abnormality involving the visualized mediastinal structures, trachea or lung apices. OTHER: No significant abnormality.     1. Negative. This dictation was created with voice recognition software.  While attempts have been made to review the dictation as it was transcribed, occasionally the spoken  word can be misinterpreted  by the technology, leading to omissions or inappropriate words or phrases.  If questions or concerns arise, please contact the Radiology Department. Electronically signed by Elouise Hugh, MD    CT ABDOMEN PELVIS WO CONTRAST Additional Contrast? None  Result Date: 03/28/2024  DATE: 03/28/2024 EXAM: CT ABDOMEN PELVIS WO CONTRAST INDICATION: hip pain Pain COMPARISON: 2015 TECHNIQUE: Axial CT imaging obtained from lung bases through pelvis.  Axial images and multiplanar reformatted images are provided for review.  Up-to-date CT equipment and radiation dose reduction techniques were employed. IV Contrast: None Oral Contrast: No. FINDINGS: SCOUT: Nondiagnostic scout image(s) obtained for localization purposes, not for diagnostic use DEVICES/HARDWARE: None . LUNG BASES: Clear. LIVER: There is decreased in attenuation. Liver is up to 25 cm in craniocaudal dimension. GALLBLADDER AND BILIARY TREE: No calcified gallstones. No gallbladder distention.  No intra- or extrahepatic biliary dilatation. PANCREAS: Normal. SPLEEN: Spleen is up to 14 cm in craniocaudal dimension. ADRENAL GLANDS: Normal. KIDNEYS AND URETERS: No hydronephrosis. Focal areas of striated enhancement of the kidneys bilaterally. Mild perinephric haziness bilaterally without fluid collection. BOWEL: Normal diameter, nonobstructed.  Appendix is not dilated or thickened. Distal colonic diverticulosis. URINARY BLADDER: Normal. REPRODUCTIVE ORGANS: No associated masses. LYMPH NODES: No abnormally enlarged nodes. PERITONEUM/RETROPERITONEUM: No ascites or free air. VESSELS: Aorta and IVC without significant abnormality.  ABDOMINAL WALL: Mild periarticular edema or swelling of the left hip. BONES: No significant abnormality. OTHER FINDINGS: None.     1. Diverticulosis of the colon without evidence of diverticulitis. 2. Focal areas of striated enhancement of the kidneys, previous CT contrast administration. Findings could indicate renal dysfunction or infection. 3. Mild periarticular edema or swelling of the left hip suggesting muscular injury/strain. 4. Hepatosplenomegaly. This dictation was created with voice recognition software.  While attempts have been made to review the dictation as it was transcribed, occasionally the spoken word can be misinterpreted  by the technology, leading to omissions or inappropriate words or phrases.  If questions or concerns arise, please contact the Radiology  Department. Electronically signed by Elouise Hugh, MD    CT HEAD WO CONTRAST  Result Date: 03/28/2024  EXAM: CT HEAD WO CONTRAST STUDY DATE: 03/28/2024 11:15 EST INDICATION: neck pain COMPARISON: None TECHNIQUE: Standard per department protocol. Up-to-date CT equipment and radiation dose reduction techniques were employed. CONTRAST: No intravenous FINDINGS: Brain Parenchyma: No midline shift, mass effect, parenchymal hemorrhage, or evidence of acute territorial infarct. Ventricular System and Extra-Axial Spaces: No extra-axial fluid collections. Basilar cisterns are patent. No hydrocephalus. Extracranial Structures: No significant paranasal sinus disease. No calvarial abnormality. No orbit abnormality.     *  No acute intracranial findings. Electronically signed by Isom Repress    CT CHEST PULMONARY EMBOLISM W CONTRAST  Result Date: 03/27/2024  DATE: 03/27/2024 EXAM: CT CHEST PULMONARY EMBOLISM W CONTRAST INDICATION: c/f PE, Chest pain COMPARISON: None. TECHNIQUE: Axial CT imaging obtained through the chest using CT pulmonary angiogram protocol. Axial images, multiplanar reformatted images and maximum intensity projection images were reviewed.  Up-to-date CT equipment and radiation dose reduction techniques were employed.  IV Contrast Media and volume: 80 cc iodinated contrast FINDINGS: SCOUT: Nondiagnostic scout image(s) obtained for localization purposes, not for diagnostic use DEVICES/HARDWARE: None . DIAGNOSTIC QUALITY: Suboptimal. PULMONARY EMBOLI: None. RIGHT HEART STRAIN: None. LUNGS AND AIRWAYS: Airways are patent.  Lungs are clear. PLEURA: No pleural effusions or significant pleural thickening. HEART / GREAT VESSELS: Heart is not enlarged. ADENOPATHY: None. CHEST WALL / LOWER NECK: No significant abnormality. UPPER ABDOMEN: Normal.  BONES: No significant abnormality. OTHER FINDINGS: None.     1. No evidence of pulmonary embolus. This dictation was created with voice recognition software.  While attempts  have been made to review the dictation as it was transcribed, occasionally the spoken word can be misinterpreted  by the technology, leading to omissions or inappropriate words or phrases.  If questions or concerns arise, please contact the Radiology Department. Electronically signed by Elouise Hugh, MD    XR CHEST PORTABLE  Result Date: 03/27/2024  Chest HISTORY : Dyspnea VIEWS : 1     Clear lungs. Normal cardiac mediastinal silhouette. Electronically signed by Lynwood Schwalbe, MD      CBC:   Recent Labs     04/01/24  1042 04/02/24  0656 04/03/24  0424   WBC 16.2* 16.7* 15.9*   HGB 7.5* 7.0* 9.2*   PLT 504* 583* 610*     BMP:    Recent Labs     03/31/24  0933 04/01/24  1042 04/01/24  1952 04/02/24  0656   NA 135* 135*  --  137   K 3.1* 3.0* 3.6 3.2*   CL 97* 96*  --  98*   CO2 26 28  --  28   BUN 4* 5*  --  4*   CREATININE 0.6 0.6  --  0.5*   GLUCOSE 163* 228*  --  105*     Hepatic: No results for input(s): AST, ALT, BILITOT, ALKPHOS in the last 72 hours.    Invalid input(s): ALB  Lipids:   Lab Results   Component Value Date/Time    CHOL 154 11/25/2023 02:44 PM    HDL 22 11/25/2023 02:44 PM    TRIG 170 11/25/2023 02:44 PM     Hemoglobin A1C:   Lab Results   Component Value Date/Time    LABA1C 13.9 03/27/2024 03:47 AM     TSH:   Lab Results   Component Value Date/Time    TSH 1.92 08/27/2022 04:35 PM     Troponin: No results found for: TROPONINT  Lactic Acid:   No results for input(s): LACTA in the last 72 hours.    BNP: No results for input(s): PROBNP in the last 72 hours.  UA:  Lab Results   Component Value Date/Time    NITRU Negative 03/28/2024 02:03 AM    COLORU Yellow 03/28/2024 02:03 AM    PHUR 6.0 03/28/2024 02:03 AM    PHUR 5.0 03/28/2024 02:03 AM    PHUR 5.5 02/25/2017 12:22 PM    WBCUA 3-5 03/28/2024 02:03 AM    RBCUA 5-10 03/28/2024 02:03 AM    BACTERIA 3+ 03/28/2024 02:03 AM    CLARITYU Clear 03/28/2024 02:03 AM    LEUKOCYTESUR TRACE 03/28/2024 02:03 AM    UROBILINOGEN 0.2 03/28/2024  02:03 AM    BILIRUBINUR Negative 03/28/2024 02:03 AM    BLOODU SMALL 03/28/2024 02:03 AM    GLUCOSEU Negative 03/28/2024 02:03 AM    KETUA Negative 03/28/2024 02:03 AM     Urine Cultures:   Lab Results   Component Value Date/Time    LABURIN 25,000 CFU/ml  PBP2= Negative   03/28/2024 01:54 AM     Blood Cultures:   Lab Results   Component Value Date/Time    [facility]  03/29/2024 02:48 PM     POSITIVE  Isolated one of two sets  No further workup  Refer to culture #[id number] for sensitivity results       Lab Results   Component Value Date/Time  BLOODCULT2  03/30/2024 04:21 PM     No Growth to date.  Any change in status will be called.     Organism:   Lab Results   Component Value Date/Time    ORG Staphylococcus aureus 04/01/2024 09:20 AM         Electronically signed by Marne DELENA Creighton, MD on 04/03/2024 at 9:29 AM  Comment: Please note this report has been produced using speech recognition software and may contain errors related to that system including errors in grammar, punctuation, and spelling, as well as words and phrases that may be inappropriate. If there are any questions or concerns, please feel free to contact the dictating provider for clarification.   "

## 2024-04-03 NOTE — Progress Notes (Addendum)
"  Pt A&Ox4, VSS on RA, with exception of tachycardia and fever- tylenol  given. Pt ambulates x1 walker/GB. Pt voiding and tolerating PO fluids. Managing pain with PRN pain meds per MAR. Fall precautions in place. Bed alarm on and in lowest position. Plan of care continues.   "

## 2024-04-03 NOTE — Plan of Care (Signed)
"    Problem: Safety - Adult  Goal: Free from fall injury  04/03/2024 0353 by Janit Ely, RN  Outcome: Progressing   Pt remains free from injury during this shift. Pt is up x 1 person, gait belt and walker. Encourage pt to call for all assistance. Call light is in reach, bed alarm is activated for safety, bed locked and in lowest position. Will continue to monitor.    Problem: Pain  Goal: Verbalizes/displays adequate comfort level or baseline comfort level  04/03/2024 0353 by Janit Ely, RN  Outcome: Progressing   Medicated pt per orders, please see eMar. Encourage pt to call if pain increases. Will continue to monitor.  "

## 2024-04-03 NOTE — Progress Notes (Signed)
 "Occupational Therapy  Facility/Department: [facility] ORTHO/NEURO  Occupational Therapy Initial Assessment/Treatment    Name: Shelly Richard  DOB: 04/23/1985  MRN: [id number]  Date of Service: 04/03/2024    Discharge Recommendations:  24 hour supervision or assist, Home with Home health OT  OT Equipment Recommendations  Equipment Needed: Yes  Mobility Devices: ADL Assistive Devices;Walker  Walker: Rolling  ADL Assistive Devices: Emergency Planning/management Officer       Patient Diagnosis(es): The primary encounter diagnosis was Acute nonintractable headache, unspecified headache type. Diagnoses of Diabetic ketoacidosis without coma associated with type 2 diabetes mellitus (HCC), Heart murmur, MSSA bacteremia, and Abscess of bursa of left hip were also pertinent to this visit.  Past Medical History:  has a past medical history of Acute headache, Allergies, Asthma, Diabetes mellitus (HCC), Genital herpes, HTN (hypertension), Hyperlipidemia, Hypothyroid, and Urticaria.  Past Surgical History:  has a past surgical history that includes cyst removal; hysteroscopy; myomectomy (N/A, 02/01/2022); and CT ABSCESS DRAINAGE SOFT TISSUE (04/01/2024).    Treatment Diagnosis: impaired ADLs, mobility and transfers      Assessment  Performance deficits / Impairments: Decreased functional mobility ;Decreased endurance;Decreased ADL status;Decreased balance;Decreased strength  Assessment: Pt is a 38 y.o F from home with family who reports working full time and is independent with all ADLs and mobility without AE. Pt currently functioning significantly below baseline due to generalized debility, weakness, pain to R hip, affecting ADL performance, transfers and requiring use of RW for mobility. Pt reports having good support at home and may benefit from home health OT to establish HEP and continue to improve functional independence. Pt will need RW upon dc and would benefit from a TTB for energy conservation. Will follow during inpt stay.  Treatment  Diagnosis: impaired ADLs, mobility and transfers  Prognosis: Good  Decision Making: Medium Complexity  REQUIRES OT FOLLOW-UP: Yes  Activity Tolerance  Activity Tolerance: Patient limited by pain     Plan  Occupational Therapy Plan  Times Per Week: 2-5 times in acute care setting  Current Treatment Recommendations: Strengthening, Self-Care / ADL, Balance training, Home management training, Safety education & training, Functional mobility training, Patient/Caregiver education & training, Endurance training, Equipment evaluation, education, & procurement  Additional Comments: see above for post acute recommendations    Restrictions  Position Activity Restriction  Other Position/Activity Restrictions: up with assist    Subjective  General  Chart Reviewed: Yes  Additional Pertinent Hx: The patient is a 38 y.o. female who presents with above chief complaint.  Admitted recently due to DKA with suspected UTI and bacteremia. Reports left hip pain. Family member states prior to admission patient was sitting cross-legged for long periods of time in bed over 2 weeks. No history of prior reported hip injury or instrumentation; s/p INCISION AND DRAINAGE OF LEFT HIP GREATER TROCHANTERIC BURSA 12/19  Family / Caregiver Present: No  Referring Practitioner: Sherline Lamar Gaskins, MD  Diagnosis: DKA type 2  Subjective  Subjective: Pt asleep in bed upon arrival, agreeable to OT evaluation but very lethargic. Pt denies pain.     Social/Functional History  Social/Functional History  Lives With: Family (2 kids aged 62, 67, 65 year old grandson and boyfriend)  Type of Home:  (townhouse)  Home Layout: Two level, Bed/Bath upstairs, 1/2 bath on main level  Home Access: Stairs to enter without rails  Entrance Stairs - Number of Steps: 1 STE, 14 steps with unilateral HR to second floor  Bathroom Shower/Tub: Medical Sales Representative: Standard  Has  the patient had two or more falls in the past year or any fall with injury in the past year?:  No  Receives Help From: Family  Prior Level of Assist for ADLs: Independent  Prior Level of Assist for Homemaking: Independent  Prior Level of Assist for Ambulation: Independent household ambulator, with or without device  Prior Level of Assist for Transfers: Surveyor, Minerals: Yes  Type of Occupation: paraprofessional  Leisure & Hobbies: craft    Objective  Temp: 99.9 F (37.7 C)  Pulse: (!) 115  Heart Rate Source: Monitor  Respirations: 16  SpO2: 97 %  O2 Device: None (Room air)  BP: 138/74  MAP (Calculated): 95  BP Location: Left upper arm  BP Method: Automatic  Patient Position: Semi fowlers  Vision  Vision: Impaired  Vision Exceptions: Wears glasses at all times  Hearing  Hearing: Within functional limits          Safety Devices  Type of Devices: Left in bed;Bed alarm in place;Call light within reach;Nurse notified  Balance  Sitting: Intact  Standing: With support (SBA statically with RW, CGA dynamically)  Copywriter, Advertising - Technique: Ambulating  Equipment Used: Standard toilet  Toilet Transfer: Stand by Interior And Spatial Designer Transfers Comments: use of GB on L  AROM: Within functional limits  Strength: Within functional limits  Coordination: Within functional limits  Tone: Normal  Sensation: Intact      ADL  Feeding: Independent  Feeding Skilled Clinical Factors: lunch tray  Grooming: Stand by assistance  Grooming Skilled Clinical Factors: hand and oral hygiene in stance at sink, pt washed face, heavy offloading with BUEs on sink  Toileting: Stand by assistance  Toileting Skilled Clinical Factors: pt continent of urine, + time in attempt to have BM, perihygiene seated i'ly  Functional Mobility: Contact guard assistance  Functional Mobility Skilled Clinical Factors: to/from bathroom using RW, antalgic RLE, limping, heavy reliance of BUEs on RW, decreased cadence  Product Used : Bath wipes        Bed mobility  Supine to Sit: Substantial/Maximal assistance (++ time, effortful, assist to manage BLEs  off EOB and for trunk ascension, limited by pain)  Sit to Supine: Stand by assistance (HOB elevated)  Scooting: 2 Person assistance (dep to scoot pt superiorly in bed)  Transfers  Sit to stand: Stand by assistance  Stand to sit: Stand by assistance     Cognition  Overall Cognitive Status: [facility]  Orientation  Overall Orientation Status: Within Functional Limits                  Education Given To: Patient  Education Provided: Role of Therapy;Plan of Care;Equipment;ADL Company Secretary  Education Method: Verbal  Barriers to Learning: None  Education Outcome: Verbalized understanding;Demonstrated understanding;Continued education needed                     G-Code     OutComes Score                                                  AM-PAC - ADL  AM-PAC Daily Activity - Inpatient   How much help is needed for putting on and taking off regular lower body clothing?: A Lot  How much help is needed for bathing (which includes washing, rinsing, drying)?: A Lot  How much help is needed for toileting (which includes using toilet, bedpan, or urinal)?: A Little  How much help is needed for putting on and taking off regular upper body clothing?: None  How much help is needed for taking care of personal grooming?: None  How much help for eating meals?: None  AM-PAC Inpatient Daily Activity Raw Score: 19  AM-PAC Inpatient ADL T-Scale Score : 40.22  ADL Inpatient CMS 0-100% Score: 42.8  ADL Inpatient CMS G-Code Modifier : CK    Tinneti Score       Goals  Short Term Goals  Time Frame for Short Term Goals: by dc  Short Term Goal 1: Pt will complete LB dressing mod I  Short Term Goal 2: Pt will complete toileting and toilet transfer mod I  Short Term Goal 3: Pt will complete bed mobility mod I  Patient Goals   Patient goals : to go home      Therapy Time   Individual Concurrent Group Co-treatment   Time In 1121         Time Out 1223         Minutes 62         Timed Code Treatment Minutes: 47 minutes +  15 min eval=62 Minutes       Tillman Skiff, OT     "

## 2024-04-03 NOTE — Plan of Care (Signed)
"    Problem: Chronic Conditions and Co-morbidities  Goal: Patient's chronic conditions and co-morbidity symptoms are monitored and maintained or improved  Outcome: Progressing     Problem: Discharge Planning  Goal: Discharge to home or other facility with appropriate resources  Outcome: Progressing     Problem: Safety - Adult  Goal: Free from fall injury  04/03/2024 2148 by Netta Norris, RN  Outcome: Progressing    Problem: Pain  Goal: Verbalizes/displays adequate comfort level or baseline comfort level  04/03/2024 2148 by Netta Norris, RN  Outcome: Progressing       Problem: Nutrition Deficit:  Goal: Optimize nutritional status  Outcome: Progressing     Problem: ABCDS Injury Assessment  Goal: Absence of physical injury  Outcome: Progressing  Flowsheets (Taken 04/03/2024 2145)  Absence of Physical Injury: Implement safety measures based on patient assessment     Problem: Neurosensory - Adult  Goal: Achieves stable or improved neurological status  Outcome: Progressing  Goal: Achieves maximal functionality and self care  Outcome: Progressing     Problem: Cardiovascular - Adult  Goal: Maintains optimal cardiac output and hemodynamic stability  Outcome: Progressing  Goal: Absence of cardiac dysrhythmias or at baseline  Outcome: Progressing     Problem: Skin/Tissue Integrity - Adult  Goal: Skin integrity remains intact  Outcome: Progressing  Flowsheets (Taken 04/03/2024 2145)  Skin Integrity Remains Intact: Monitor for areas of redness and/or skin breakdown  Goal: Oral mucous membranes remain intact  Outcome: Progressing     Problem: Musculoskeletal - Adult  Goal: Return mobility to safest level of function  Outcome: Progressing  Goal: Maintain proper alignment of affected body part  Outcome: Progressing  Goal: Return ADL status to a safe level of function  Outcome: Progressing     Problem: Infection - Adult  Goal: Absence of infection at discharge  Outcome: Progressing  Goal: Absence of infection during  hospitalization  Outcome: Progressing  Goal: Absence of fever/infection during anticipated neutropenic period  Outcome: Progressing     Problem: Metabolic/Fluid and Electrolytes - Adult  Goal: Electrolytes maintained within normal limits  Outcome: Progressing  Goal: Hemodynamic stability and optimal renal function maintained  Outcome: Progressing  Goal: Glucose maintained within prescribed range  Outcome: Progressing     Problem: Hematologic - Adult  Goal: Maintains hematologic stability  Outcome: Progressing     Problem: Behavior  Goal: Pt/Family maintain appropriate behavior and adhere to behavioral management agreement, if implemented  Description: INTERVENTIONS:  1. Assess patient/family's coping skills and  non-compliant behavior (including use of illegal substances)  2. Notify security of behavior or suspected illegal substances which indicate the need for search of the family and/or belongings  3. Encourage verbalization of thoughts and concerns in a socially appropriate manner  4. Utilize positive, consistent limit setting strategies supporting safety of patient, staff and others  5. Encourage participation in the decision making process about the behavioral management agreement  6. If a visitor's behavior poses a threat to safety call refer to organization policy.  7. Initiate consult with Social Worker, Psychosocial CNS, Spiritual Care as appropriate  Outcome: Progressing     "

## 2024-04-03 NOTE — Plan of Care (Signed)
"    Problem: Safety - Adult  Goal: Free from fall injury  04/03/2024 1454 by Estelle Flaming, RN  Outcome: Progressing  04/03/2024 0353 by Janit Ely, RN  Outcome: Progressing   Fall precautions in place. Bed alarm on and in lowest position. Call light, belongings, bedside table within reach.     Problem: Pain  Goal: Verbalizes/displays adequate comfort level or baseline comfort level  04/03/2024 1454 by Estelle Flaming, RN  Outcome: Progressing  04/03/2024 0353 by Janit Ely, RN  Outcome: Progressing   Assessing pain using numeric pain scale. Managing pain with meds per MAR. Using repositioining and pillow support for comfort.  "

## 2024-04-03 NOTE — Progress Notes (Signed)
 "  Progress Note    Subjective:       Systemic or Specific Complaints: Pain Control    Objective:     Patient Vitals for the past 24 hrs:   BP Temp Temp src Pulse Resp SpO2 Weight   04/03/24 1304 -- -- -- -- 16 -- --   04/03/24 1300 120/74 99.7 F (37.6 C) Oral (!) 117 16 98 % --   04/03/24 0957 -- -- -- -- 16 -- --   04/03/24 0857 -- -- -- -- 16 -- --   04/03/24 0845 138/74 99.9 F (37.7 C) Oral (!) 115 16 97 % --   04/03/24 0600 -- -- -- -- -- -- 97.6 kg (215 lb 2.7 oz)   04/03/24 0415 137/77 98.4 F (36.9 C) Oral (!) 106 16 97 % --   04/02/24 2354 122/71 98.8 F (37.1 C) Oral (!) 119 16 93 % --   04/02/24 2115 129/70 99.5 F (37.5 C) Oral (!) 122 16 94 % --   04/02/24 1917 -- -- -- -- 16 -- --   04/02/24 1630 (!) 165/91 99 F (37.2 C) Oral (!) 120 20 98 % --   04/02/24 1610 (!) 147/88 98.5 F (36.9 C) Oral (!) 121 21 100 % --   04/02/24 1600 (!) 154/89 98.4 F (36.9 C) Oral (!) 120 17 100 % --   04/02/24 1545 (!) 145/83 -- -- (!) 123 26 100 % --   04/02/24 1535 -- -- -- (!) 120 18 100 % --   04/02/24 1530 (!) 156/92 98 F (36.7 C) Temporal (!) 121 17 100 % --   04/02/24 1525 -- -- -- (!) 123 23 100 % --   04/02/24 1524 (!) 153/89 97.9 F (36.6 C) Temporal (!) 120 18 100 % --   04/02/24 1520 -- -- -- (!) 121 18 100 % --   04/02/24 1515 (!) 153/89 -- -- (!) 121 16 100 % --   04/02/24 1510 (!) 148/89 97.9 F (36.6 C) Temporal (!) 120 18 100 % --   04/02/24 1506 -- -- -- (!) 120 (!) 8 100 % --   04/02/24 1505 -- -- -- (!) 120 (!) 7 100 % --   04/02/24 1500 (!) 148/89 98.5 F (36.9 C) Temporal (!) 122 13 95 % --   04/02/24 1458 (!) 148/89 -- -- (!) 125 17 (!) 88 % --   04/02/24 1450 (!) 154/88 -- -- (!) 123 15 100 % --   04/02/24 1445 (!) 154/86 -- -- (!) 124 20 97 % --   04/02/24 1440 (!) 147/84 -- -- (!) 126 20 96 % --   04/02/24 1435 (!) 151/85 98.1 F (36.7 C) Temporal (!) 128 19 (!) 86 % --       General: alert, appears stated age, and cooperative   Wound: Wound clean and dry, HVC remains in place,  30cc output   Motion: Painful range of Motion in affected extremity but able to bear weight   DVT Exam: No evidence of DVT seen on physical exam.     Additional exam:      Data Review  CBC:   Lab Results   Component Value Date/Time    WBC 15.9 04/03/2024 04:24 AM    RBC 3.44 04/03/2024 04:24 AM    HGB 9.2 04/03/2024 04:24 AM    HCT 27.7 04/03/2024 04:24 AM    PLT 610 04/03/2024 04:24 AM       Renal:  Lab Results   Component Value Date/Time    NA 137 04/02/2024 06:56 AM    K 3.2 04/02/2024 06:56 AM    CL 98 04/02/2024 06:56 AM    CO2 28 04/02/2024 06:56 AM    BUN 4 04/02/2024 06:56 AM    CREATININE 0.5 04/02/2024 06:56 AM    GLUCOSE 105 04/02/2024 06:56 AM    CALCIUM  8.2 04/02/2024 06:56 AM            Assessment:     POD #1 stable s/p I&D left greater trochanteric MSSA septic bursa     Plan:      1:  Continue with IV abx per ID recs and culture results  2:  Can likely pull Hemovac drain tomorrow if output diminishes      LAMAR GRETTA BIDDING, MD  [facility]  [phone] (cell)  [phone] (appointment scheduling)  "

## 2024-04-04 LAB — CBC WITH AUTO DIFFERENTIAL
Basophils %: 0.7 %
Basophils Absolute: 0.1 K/uL (ref 0.0–0.2)
Eosinophils %: 0.9 %
Eosinophils Absolute: 0.1 K/uL (ref 0.0–0.6)
Hematocrit: 29.4 % — ABNORMAL LOW (ref 36.0–48.0)
Hemoglobin: 9 g/dL — ABNORMAL LOW (ref 12.0–16.0)
Lymphocytes %: 13.5 %
Lymphocytes Absolute: 1.7 K/uL (ref 1.0–5.1)
MCH: 24.7 pg — ABNORMAL LOW (ref 26.0–34.0)
MCHC: 30.6 g/dL — ABNORMAL LOW (ref 31.0–36.0)
MCV: 80.8 fL (ref 80.0–100.0)
MPV: 8 fL (ref 5.0–10.5)
Monocytes %: 6.1 %
Monocytes Absolute: 0.8 K/uL (ref 0.0–1.3)
Neutrophils %: 78.8 %
Neutrophils Absolute: 9.8 K/uL — ABNORMAL HIGH (ref 1.7–7.7)
Platelets: 683 K/uL — ABNORMAL HIGH (ref 135–450)
RBC: 3.64 M/uL — ABNORMAL LOW (ref 4.00–5.20)
RDW: 17.1 % — ABNORMAL HIGH (ref 12.4–15.4)
WBC: 12.4 K/uL — ABNORMAL HIGH (ref 4.0–11.0)

## 2024-04-04 LAB — POCT GLUCOSE
POC Glucose: 239 mg/dL — ABNORMAL HIGH (ref 70–99)
POC Glucose: 292 mg/dL — ABNORMAL HIGH (ref 70–99)
POC Glucose: 236 mg/dL — ABNORMAL HIGH (ref 70–99)
POC Glucose: 137 mg/dL — ABNORMAL HIGH (ref 70–99)

## 2024-04-04 MED ORDER — INSULIN LISPRO 100 UNIT/ML IJ SOLN
100 | Freq: Three times a day (TID) | INTRAMUSCULAR | Status: DC
Start: 2024-04-04 — End: 2024-04-07
  Administered 2024-04-04 – 2024-04-07 (×9): 10 [IU] via SUBCUTANEOUS

## 2024-04-04 MED ORDER — INSULIN GLARGINE 100 UNIT/ML SC SOLN
100 | Freq: Every evening | SUBCUTANEOUS | Status: DC
Start: 2024-04-04 — End: 2024-04-07
  Administered 2024-04-05 – 2024-04-07 (×3): 40 [IU] via SUBCUTANEOUS

## 2024-04-04 MED FILL — NAFCILLIN SODIUM 2 G IJ SOLR: 2 g | INTRAMUSCULAR | Qty: 2000 | Fill #0

## 2024-04-04 MED FILL — FERROUS SULFATE 325 (65 FE) MG PO TABS: 325 (65 Fe) MG | ORAL | Qty: 1 | Fill #0

## 2024-04-04 MED FILL — OXYCODONE HCL 5 MG PO TABS: 5 mg | ORAL | Qty: 2 | Fill #0

## 2024-04-04 MED FILL — HYDROMORPHONE HCL 1 MG/ML IJ SOLN: 1 mg/mL | INTRAMUSCULAR | Qty: 1 | Fill #0

## 2024-04-04 MED FILL — CETIRIZINE HCL 10 MG PO TABS: 10 mg | ORAL | Qty: 1 | Fill #0

## 2024-04-04 MED FILL — LEVOTHYROXINE SODIUM 25 MCG PO TABS: 25 ug | ORAL | Qty: 1 | Fill #0

## 2024-04-04 MED FILL — INSULIN LISPRO 100 UNIT/ML IJ SOLN: 100 [IU]/mL | INTRAMUSCULAR | Qty: 2 | Fill #0

## 2024-04-04 MED FILL — ASPIRIN LOW DOSE 81 MG PO TBEC: 81 mg | ORAL | Qty: 1 | Fill #0

## 2024-04-04 MED FILL — INSULIN LISPRO 100 UNIT/ML IJ SOLN: 100 [IU]/mL | INTRAMUSCULAR | Qty: 4 | Fill #0

## 2024-04-04 MED FILL — METHOCARBAMOL 750 MG PO TABS: 750 mg | ORAL | Qty: 1 | Fill #0

## 2024-04-04 MED FILL — ACETAMINOPHEN 325 MG PO TABS: 325 mg | ORAL | Qty: 2 | Fill #0

## 2024-04-04 MED FILL — LANTUS 100 UNIT/ML SC SOLN: 100 [IU]/mL | SUBCUTANEOUS | Qty: 30 | Fill #0

## 2024-04-04 MED FILL — INSULIN LISPRO 100 UNIT/ML IJ SOLN: 100 [IU]/mL | INTRAMUSCULAR | Qty: 10 | Fill #0

## 2024-04-04 MED FILL — MONTELUKAST SODIUM 10 MG PO TABS: 10 mg | ORAL | Qty: 1 | Fill #0

## 2024-04-04 MED FILL — FUROSEMIDE 20 MG PO TABS: 20 mg | ORAL | Qty: 1 | Fill #0

## 2024-04-04 MED FILL — PANTOPRAZOLE SODIUM 40 MG PO TBEC: 40 mg | ORAL | Qty: 1 | Fill #0

## 2024-04-04 NOTE — Plan of Care (Signed)
"    Problem: Chronic Conditions and Co-morbidities  Goal: Patient's chronic conditions and co-morbidity symptoms are monitored and maintained or improved  Outcome: Progressing     Problem: Discharge Planning  Goal: Discharge to home or other facility with appropriate resources  Outcome: Progressing     Problem: Safety - Adult  Goal: Free from fall injury  04/04/2024 2132 by Reatha Hole, RN  Outcome: Progressing     Problem: Pain  Goal: Verbalizes/displays adequate comfort level or baseline comfort level  04/04/2024 2132 by Reatha Hole, RN  Outcome: Progressing     Problem: Nutrition Deficit:  Goal: Optimize nutritional status  Outcome: Progressing     Problem: Neurosensory - Adult  Goal: Achieves stable or improved neurological status  Outcome: Progressing     Problem: Neurosensory - Adult  Goal: Achieves maximal functionality and self care  Outcome: Progressing     Problem: Cardiovascular - Adult  Goal: Maintains optimal cardiac output and hemodynamic stability  Outcome: Progressing     Problem: Skin/Tissue Integrity - Adult  Goal: Skin integrity remains intact  Outcome: Progressing  Flowsheets (Taken 04/04/2024 2130)  Skin Integrity Remains Intact: Monitor for areas of redness and/or skin breakdown     Problem: Musculoskeletal - Adult  Goal: Return mobility to safest level of function  Outcome: Progressing     Problem: Musculoskeletal - Adult  Goal: Maintain proper alignment of affected body part  Outcome: Progressing     Problem: Musculoskeletal - Adult  Goal: Return ADL status to a safe level of function  Outcome: Progressing     "

## 2024-04-04 NOTE — Progress Notes (Addendum)
"  VSS, febrile x1, tylenol  given. Alert and oriented. PRN pain medication given with relief noted. No acute events overnight. All fall & safety precautions in place. Call light within reach. Continue current plan of care.    "

## 2024-04-04 NOTE — Plan of Care (Signed)
"    Problem: Safety - Adult  Goal: Free from fall injury  04/04/2024 0736 by Estelle Flaming, RN  Outcome: Progressing  04/03/2024 2148 by Netta Norris, RN  Outcome: Progressing   Fall precautions in place. Bed alarm on and in lowest position. Call light, belongings, bedside table within reach.     Problem: Pain  Goal: Verbalizes/displays adequate comfort level or baseline comfort level  04/04/2024 0736 by Estelle Flaming, RN  Outcome: Progressing  04/03/2024 2148 by Netta Norris, RN  Outcome: Progressing   Assessing pain using numeric pain scale. Managing pain with meds per MAR. Using repositioining and pillow support for comfort.  "

## 2024-04-04 NOTE — Progress Notes (Signed)
 "ID Follow-up NOTE    CC:   DKA, left hip pain  Antibiotics: Nafcillin     Admit Date: 03/26/2024  Hospital Day: 10    Subjective:     Patient is status post left hip bursa washout on 12/19. states pain better controlled.  No fevers      Objective:     Patient Vitals for the past 8 hrs:   BP Temp Temp src Pulse Resp SpO2   04/04/24 1821 -- -- -- -- 16 --   04/04/24 1517 -- -- -- -- 16 --   04/04/24 1515 138/70 (!) 100.6 F (38.1 C) Oral (!) 124 16 100 %   04/04/24 1417 -- -- -- -- 16 --   04/04/24 1215 124/70 99.7 F (37.6 C) Oral (!) 119 16 98 %     I/O last 3 completed shifts:  In: 3433.6 [P.O.:1100; I.V.:162.1; IV Piggyback:2171.5]  Out: 30 [Drains:30]  No intake/output data recorded.    EXAM:  GENERAL: No apparent distress.    HEENT: Membranes moist, no oral lesion  NECK:  Supple, no lymphadenopathy  LUNGS: Clear b/l, no rales, no dullness  CARDIAC: RRR  ABD:  + BS, soft   EXT:  No rash, slight left hip pain.   NEURO: No focal neurologic findings  PSYCH: Orientation, sensorium, mood normal  LINES:  Peripheral iv       Data Review:  Lab Results   Component Value Date    WBC 12.4 (H) 04/04/2024    HGB 9.0 (L) 04/04/2024    HCT 29.4 (L) 04/04/2024    MCV 80.8 04/04/2024    PLT 683 (H) 04/04/2024     Lab Results   Component Value Date    CREATININE 0.5 (L) 04/02/2024    BUN 4 (L) 04/02/2024    NA 137 04/02/2024    K 3.2 (L) 04/02/2024    CL 98 (L) 04/02/2024    CO2 28 04/02/2024       Hepatic Function Panel:   Lab Results   Component Value Date/Time    ALKPHOS 154 11/25/2023 02:44 PM    ALT 16 11/25/2023 02:44 PM    AST 15 11/25/2023 02:44 PM    BILITOT 0.7 11/25/2023 02:44 PM    BILIDIR <0.2 02/03/2014 06:04 PM    IBILI see below 02/03/2014 06:04 PM       MICRO:  Or culture left hip bursa 12/19: Rare growth Staph aureus, MSSA    Blood cultures x 212/16: No growth    Left hip bursa aspirate culture 12/18: Moderate growth MSSA    Blood cultures x 2 12/15: Positive for MSSA    Blood cultures x 2 12/13: Positive for  MSSA  Staphylococcus aureus (2)    Antibiotic Interpretation MIC  Method Status    clindamycin  Sensitive <=0.25 mcg/mL BACTERIAL SUSCEPTIBILITY PANEL BY MIC     erythromycin  Sensitive <=0.25 mcg/mL BACTERIAL SUSCEPTIBILITY PANEL BY MIC     levofloxacin Resistant 4 mcg/mL BACTERIAL SUSCEPTIBILITY PANEL BY MIC     linezolid Sensitive 2 mcg/mL BACTERIAL SUSCEPTIBILITY PANEL BY MIC     oxacillin Sensitive 0.5 mcg/mL BACTERIAL SUSCEPTIBILITY PANEL BY MIC     tetracycline Sensitive <=1 mcg/mL BACTERIAL SUSCEPTIBILITY PANEL BY MIC      Tetracyclines have decreased renal elimination and  urinary bladder concentrations.  Use of this class of  agents for urinary tract infection should be limited to  patients that cannot tolerate an alternative therapy.       trimethoprim-sulfamethoxazole Sensitive <=  10 mcg/mL BACTERIAL SUSCEPTIBILITY PANEL BY MIC     vancomycin  Sensitive 1 mcg/mL BACTERIAL SUSCEPTIBILITY PANEL BY MIC       Urine culture 12/14: 25,000 CFU MSSA      IMAGING: I have independently reviewed the images and reports.       MRI left hip with and without contrast 12/17:  1. There is a 4.6 cm left greater trochanter bursal fluid collection with thin tract which extends superiorly into the left gluteus medius muscle. There is extensive surrounding edema as well as edema within the left gluteus medius muscle, and lateral   aspect of the adjacent left gluteus maximus muscle. In the setting of infection this is most worrisome for a septic bursitis-abscess.  2. No evidence of any osteomyelitis.  3. No acute osseous abnormality.    X-ray left hip 12/16:  1. No acute fracture or dislocation.  2. Mild degenerative changes of the hips.    CT abdomen pelvis without contrast 12/14:  1. Diverticulosis of the colon without evidence of diverticulitis.  2. Focal areas of striated enhancement of the kidneys, previous CT contrast administration. Findings could indicate renal dysfunction or infection.  3. Mild periarticular edema or  swelling of the left hip suggesting muscular injury/strain.  4. Hepatosplenomegaly.    CT chest PE 12/13:  No evidence of pulmonary embolus.     Scheduled Meds:   insulin  glargine  40 Units SubCUTAneous Nightly    insulin  lispro  10 Units SubCUTAneous TID WC    [Held by provider] valACYclovir   500 mg Oral Daily    cetirizine   10 mg Oral Daily    montelukast   10 mg Oral Daily    ferrous sulfate   325 mg Oral Daily with breakfast    sodium chloride  flush  5-40 mL IntraVENous 2 times per day    aspirin   81 mg Oral BID    sodium chloride  flush  5-40 mL IntraVENous 2 times per day    insulin  lispro  0-8 Units SubCUTAneous 4x Daily AC & HS    pantoprazole   40 mg Oral QAM AC    nafcillin   2,000 mg IntraVENous Q4H    levothyroxine   25 mcg Oral Daily       Continuous Infusions:   sodium chloride       sodium chloride       sodium chloride       sodium chloride       dextrose  5 % and 0.45 % NaCl Stopped (03/28/24 2303)    dextrose          PRN Meds:  furosemide , sodium chloride  flush, sodium chloride , acetaminophen , oxyCODONE  **OR** oxyCODONE , HYDROmorphone  **OR** HYDROmorphone , bisacodyl , ondansetron  **OR** ondansetron , sodium chloride , potassium chloride  **OR** potassium alternative oral replacement **OR** potassium chloride , sodium chloride  flush, sodium chloride , sodium chloride , dextrose  bolus **OR** dextrose  bolus, magnesium  sulfate, sodium phosphate  15 mmol in sodium chloride  0.9 % 250 mL IVPB, magnesium  sulfate, polyethylene glycol, dextrose  5 % and 0.45 % NaCl, glucose, glucagon , dextrose , albuterol       Assessment:       Patient Active Problem List   Diagnosis    Mixed hyperlipidemia    Iron  deficiency anemia    Type 2 diabetes mellitus with hyperglycemia, with long-term current use of insulin  (HCC)    Essential hypertension    Acquired hypothyroidism    Mild intermittent asthma without complication    Allergic rhinitis    Heart murmur    Uterine fibroid    Class 1 obesity due to excess calories  with serious comorbidity  and body mass index (BMI) of 31.0 to 31.9 in adult    Abnormal uterine bleeding    Eczema    Glaucoma suspect, bilateral    Hidradenitis axillaris    HSV-2 infection    Myopic astigmatism, bilateral    Strabismus    S/P myomectomy    DKA, type 2, not at goal [facility])    Acute nonintractable headache    MSSA bacteremia    Diabetic ketoacidosis without coma associated with type 2 diabetes mellitus (HCC)    Left hip pain    Abscess of bursa of left hip        Plan:   38 year old African-American female with history of DM2, HTN, HLD that presented on 12/13 with headache and shortness of breath.  Patient has recently lost her health insurance since 12/2023 and was not taking any of her medications.  She was found to be in DKA and was admitted to the ICU.      MSSA bacteremia and left hip greater trochanteric bursitis:  - upon presentation, she was also noted to be tachycardic, hypertensive and had leukocytosis.    - Infectious workup with blood cultures 2 of 2 sets obtained on admission positive for MSSA.   - Patient was initially started on vancomycin  which has now been transitioned to nafcillin .    - In speaking to the patient and is unclear what the source of MSSA bacteremia.  She denies any recent skin or soft tissue infections, no skin tears, she has several tattoos noted which are not new.    - She adamantly denies any form of drug use.  UDS on admission was negative.    - Of note urine culture also showing 25,000 CFU of MSSA which is likely overflow effect from hematogenous infection.    - Her main complaint at this time is left hip pain and this is worsened with weightbearing.    - we rec MRI which showed 4.6cm left greater trochanteric bursal fluid collection.  This was aspirated with IR and yielded growth of MSSA.    - orthopedics evaluated and she was taken for operative washout of the bursa on 12/19 with operative cultures showing growth of the same.  -We will continue nafcillin .  Repeated sets of blood cultures  have remained negative.  TTE without any evidence of IE  - Given patient's lack of insurance, she will not qualify for home health and IV antibiotics at home and will likely need to come in to [facility] via charity care for IV antibiotics.   - Given this, we will plan to change to daptomycin  when ready for discharge due to its once a day administration.   - s/p test dose dapto on 12/18 and COC in place to start the process of possible charity care.   - Will plan PICC line placement tomorrow.     DM2:  - Good glycemic control to aid in healing and prevention of further infections.  - A1c on this admission was 13.9      INFUSION ORDERS:  - Drug: IV daptomycin  400 mg daily  - Planned End date: 04/30/2024  - Diagnosis: MSSA bacteremia, left hip septic bursitis   - Has received test dose in hospital  - Routine line maintenance  - Check CBC w diff, CMP, ESR, CRP, CK  every Mon or Tue - FAX result to [phone]  - Call with antibiotic / infusion issues, [phone]  - Call with any change in status,  transfer in or out of a facility or to hospital; This COC is only valid for current disposition - [phone].    - No f/u in outpatient ID office necessary       Medical Decision Making:  The following items were considered in medical decision making:  Discussion of patient care with other providers  Reviewed clinical lab tests  Reviewed radiology tests  Reviewed other diagnostic tests/interventions  Independent review of radiologic images  Microbiology cultures and other micro tests reviewed      Please note that this chart was generated using Dragon dictation software. Although every effort was made to ensure the accuracy of this automated transcription, some errors in transcription may have occurred inadvertently.  Any pictures or media included in this note were obtained after taking informed verbal consent from the patient and with their approval to include those in the patient's medical record.     Discussed with patient, primary  team, Dr. Arne.  Mayer Vondrak, MD   "

## 2024-04-04 NOTE — Plan of Care (Signed)
"      Patient continues of IV abx  Cultures appear to be consistent with MSSA    May remove left hip Hemovac - it is not sewn in and should simply pull from beneath dressing    May continue WBAT with PT/OT    After discharge can follow up as outpatient in 1-2 weeks      LAMAR GRETTA BIDDING, MD  [facility]  [phone] (cell)  "

## 2024-04-04 NOTE — Progress Notes (Signed)
"  Pt A&Ox4, VSS on RA, with exception of tachycardia. Pt ambulates x1 walker/GB. Pt voiding and tolerating PO fluids. Managing pain with PRN pain meds per MAR. Fall precautions in place. Bed alarm on and in lowest position. Plan of care continues   "

## 2024-04-04 NOTE — Progress Notes (Signed)
 "Physical Therapy  Facility/Department: [facility] 5T ORTHO/NEURO  Physical Therapy Initial Assessment    Name: Shelly Richard  DOB: 04-Feb-1986  MRN: [id number]  Date of Service: 04/04/2024    Discharge Recommendations:  24 hour supervision or assist, Home with Home health PT   PT Equipment Recommendations  Equipment Needed: Yes  Mobility Devices: Walker  Walker: Rolling      Patient Diagnosis(es): The primary encounter diagnosis was Acute nonintractable headache, unspecified headache type. Diagnoses of Diabetic ketoacidosis without coma associated with type 2 diabetes mellitus (HCC), Heart murmur, MSSA bacteremia, and Abscess of bursa of left hip were also pertinent to this visit.  Past Medical History:  has a past medical history of Acute headache, Allergies, Asthma, Diabetes mellitus (HCC), Genital herpes, HTN (hypertension), Hyperlipidemia, Hypothyroid, and Urticaria.  Past Surgical History:  has a past surgical history that includes cyst removal; hysteroscopy; myomectomy (N/A, 02/01/2022); and CT ABSCESS DRAINAGE SOFT TISSUE (04/01/2024).    Assessment  Body Structures, Functions, Activity Limitations Requiring Skilled Therapeutic Intervention: Decreased functional mobility ;Decreased strength;Decreased ADL status;Decreased balance;Increased pain;Decreased endurance  Assessment: Prior to admission, patient reports she was independent with functional mobility. On evaluation, patient required minA for bed mobility and CGA for sit <> stand and 50 ft ambulation with RW. Pt with decreased strength, balance, and independence with functional mobility. Pt is functioning below her baseline and would benefit from continued PT during admission. At discharge, pt would benefit from continued PT in home setting.  Treatment Diagnosis: decreased strength, balance, and independence with functional mobility.  Therapy Prognosis: Good  Decision Making: Medium Complexity  Requires PT Follow-Up: Yes  Activity Tolerance  Activity  Tolerance: Patient tolerated evaluation without incident    Plan  Physical Therapy Plan  General Plan:  (2-5x/week)  Current Treatment Recommendations: Strengthening, Balance training, Endurance training, Functional mobility training, Gait training, Transfer training, Stair training, Equipment evaluation, education, sports administrator, Safety education & training, Patient/Caregiver education & training, Therapeutic activities  Safety Devices  Type of Devices: Left in bed, Bed alarm in place, Call light within reach, Nurse notified    Restrictions  Position Activity Restriction  Other Position/Activity Restrictions: up with assist, full WB on LLE     Subjective  General  Chart Reviewed: Yes  Patient assessed for rehabilitation services?: Yes  Additional Pertinent Hx: The patient is a 38 y.o. female who presents with complaints of ongoing headache, now with development of increasing malaise and fatigue, and shortness of breath Admitted recently due to DKA with suspected UTI and bacteremia. Reports left hip pain. Family member states prior to admission patient was sitting cross-legged for long periods of time in bed over 2 weeks. No history of prior reported hip injury or instrumentation; s/p INCISION AND DRAINAGE OF LEFT HIP GREATER TROCHANTERIC BURSA 12/19  Family/Caregiver Present: Yes  Referring Practitioner: Sherline Lamar Gaskins, MD  Referral Date : 04/02/24  Diagnosis: DKA, type 2  Follows Commands: Within Functional Limits  Subjective  Subjective: Pt supine in bed. Pt agreeable to therapy.         Social/Functional History  Social/Functional History  Lives With: Family (2 kids aged 91, 63, 42 year old grandson and boyfriend)  Type of Home:  (townhouse)  Home Layout: Two level, Bed/Bath upstairs, 1/2 bath on main level  Home Access: Stairs to enter without rails  Entrance Stairs - Number of Steps: 1 STE, 14 steps with unilateral HR to second floor  Bathroom Shower/Tub: Medical Sales Representative: Standard  Has the  patient had two or more falls in the past year or any fall with injury in the past year?: No  Receives Help From: Family  Prior Level of Assist for ADLs: Independent  Prior Level of Assist for Homemaking: Independent  Prior Level of Assist for Ambulation: Independent household ambulator, with or without device  Prior Level of Assist for Transfers: Surveyor, Minerals: Yes  Type of Occupation: paraprofessional  Leisure & Hobbies: craft  Vision/Hearing  Vision  Vision: Within Functional Limits  Hearing  Hearing: Within Insurance Claims Handler  Overall Orientation Status: Within Functional Limits  Orientation Level: Oriented X4  Cognition  Overall Cognitive Status: WFL    Objective  Temp: 99.7 F (37.6 C)  Pulse: (!) 119  Heart Rate Source: Monitor  Respirations: 16  SpO2: 98 %  O2 Device: None (Room air)  BP: 124/70  MAP (Calculated): 88  BP Location: Left upper arm  BP Method: Automatic  Patient Position: Semi fowlers           Pain  Pre-Pain: 7  Post-Pain: 7  Pain Location: Hip  Pain Descriptor: Sore  Pain Interventions: Nurse notified       Strength RLE  Strength RLE: WFL  Strength LLE  Strength LLE: WFL        Bed Mobility Training  Bed Mobility Training: Yes  Interventions: Verbal cues  Supine to Sit: Minimal assistance (increased time, HOB elevated and use of rail)  Sit to Supine: Minimal assistance (increased time)  Scooting: Stand by assistance  Balance  Sitting: Intact  Standing: With support (SBA-CGA with RW)  Art Therapist: Yes  Interventions: Verbal cues  Sit to Stand: Contact guard assistance (with RW)  Stand to Sit: Contact guard assistance (with RW)  Gait Training  Right Side Weight Bearing: As tolerated  Left Side Weight Bearing: As tolerated  Gait  Gait Training: Yes  Left Side Weight Bearing: As tolerated  Right Side Weight Bearing: As tolerated  Overall Level of Assistance: Contact guard assistance  Distance (ft): 40 Feet  Assistive Device: Gait  belt;Walker, rolling  Interventions: Verbal cues  Speed/Cadence: Slow  Step Length: Left shortened;Right shortened  Gait Abnormalities: Antalgic;Step to gait       AM-PAC - Mobility    AM-PAC Basic Mobility - Inpatient   How much help is needed turning from your back to your side while in a flat bed without using bedrails?: A Little  How much help is needed moving from lying on your back to sitting on the side of a flat bed without using bedrails?: A Little  How much help is needed moving to and from a bed to a chair?: A Little  How much help is needed standing up from a chair using your arms?: A Little  How much help is needed walking in hospital room?: A Little  How much help is needed climbing 3-5 steps with a railing?: A Lot  AM-PAC Inpatient Mobility Raw Score : 17  AM-PAC Inpatient T-Scale Score : 42.13  Mobility Inpatient CMS 0-100% Score: 50.57  Mobility Inpatient CMS G-Code Modifier : CK    Goals  Short Term Goals  Time Frame for Short Term Goals: by discharge  Short Term Goal 1: Pt to perform bed mobility independently  Short Term Goal 2: Pt to perform sit <> stands with RW and supervision  Short Term Goal 3: Pt to ambulate 150 ft with RW and supervision  Short  Term Goal 4: Pt to negotiate 4 steps with handrail and supervision  Short Term Goal 5: Pt to perform BLE HEP x10 independently  Patient Goals   Patient Goals : to return to [facility]     Education  Patient Education  Education Given To: Patient  Education Provided: Role of Therapy;Transfer Training;Plan of Care;Mobility Training;Equipment  Education Method: Demonstration;Verbal  Barriers to Learning: None  Education Outcome: Verbalized understanding;Demonstrated understanding    Therapy Time   Individual Concurrent Group Co-treatment   Time In 1317         Time Out 1346         Minutes 29            Timed Code Treatment Minutes:  15 minute eval + 14 minute treatment    Total Treatment Minutes:  29 minutes    Richerd Dawn, PT         "

## 2024-04-04 NOTE — Progress Notes (Signed)
 "    V2.0    USACS Progress Note      Name:  Shelly Richard DOB/Age/Sex: [birth year]  (38 y.o. female)   MRN & CSN:  [id number] & [id number] Encounter Date/Time: 04/04/2024 12:21 PM EST   Location:  5514/5514-01 PCP: Alexandro Bernice FORBES, [name]     Attending:Sharanya Templin, Marne LABOR, MD       Hospital Day: 10  Subjective:     Chief Complaint: Sepsis    Shelly Richard is a 38 y.o. female who presents with sepsis    Subjective:  Patient feels a little better.  Appetite improving  Spiking a temp again     Blood sugars running high  Assessment and Recommendations   HPI per admitting provider :      Hospital course:    Shelly Richard is a 38 y.o. female with pmh of diabetes, hypertension, hyperlipidemia who presents with DKA, type 2, not at goal [facility]) with sepsis.  Patient has not been taking her medications due to insurance issues.  DKA was treated with an insulin  drip and now switched to subcutaneous insulin .  Workup revealed MSSA bacteremia.  Patient CT scan showed some fluid around the hip MRI confirmed possible trochanteric bursal infection.  Underwent aspiration on 12/18, culture grew staph.  Underwent I&D of abscess on 12/19      Plan:     DKA resolved    Uncontrolled type 2 diabetes-blood sugars still uncontrolled will increase Lantus  and Premeal insulin   - On Lantus  with Premeal insulin   - On sliding scale insulin   - As patient is on insulin  monitor blood sugars hypoglycemia  - A1c 13.9    Sepsis-white count trending down started to spike a fever again  - Present on admission      MSSA bacteremia-with patient spiking fever will repeat blood cultures  - Was initially on vancomycin  switched to nafcillin  on 12/15  - Repeat blood culture pending positive on 12/15, repeat blood culture on 12/16 negative  - TEE normal EF no vegetation      Acute on chronic anemia-posttransfusion hemoglobin stable around 7  -Hemoglobin dropped from 10.9 to 5.7  -Iron  studies shows iron  deficiency but with bacteremia will hold off IV  iron   - In the past patient needed iron  transfusions  - In the past patient had a lot of menstrual bleeding and was on Femara for fibroid    Hypothyroidism  -Resume low-dose Levothyroid    Hip pain-with septic bursitis-monitor drain output  -s/p I&D on 12/19 culture positive for staph  - CT shows some fluid collection  - MRI done on 12/17 shows a 4.6 cm left greater trochanteric bursal fluid collection which tracks into the gluteus muscle with surrounding edema concern for septic bursitis/abscess  - Culture positive for staph            I spent _   minutes in the care of this patient.  Over 50% of that time was in face-to-face counseling regarding disease process, diagnostic testing, preventative measures, and answering patient and family questions.     Diet ADULT DIET; Regular; 3 carb choices (45 gm/meal)   DVT Prophylaxis []  Lovenox , []   Heparin, []  SCDs, []  Ambulation,  []  Eliquis, []  Xarelto  []  Coumadin   Code Status Full Code   Disposition   Unclear     Surrogate Decision Maker/ POA Spouse       MDM  [x]  High (any 2 of A, B, or C)  A. Problems (any 1)  [x]  Acute/Chronic Illness/injury posing threat to life or bodily function:    []  Severe exacerbation of chronic illness:    ---------------------------------------------------------------------  B. Risk of Treatment (any 1)   []  IV ABX requiring serial renal monitoring for nephrotoxicity:     [x]  IV Narcotic analgesia for adverse drug reaction  []  IV diuresis requiring serial monitoring for renal impairment and electrolyte derangements  []  Critical electrolyte abnormalities requiring IV replacement and close serial monitoring  [x]  Insulin  - monitoring serial FSBS for Hypoglycemic adverse drug reaction  []  Anticoagulation requiring serial monitoring of coagulation factors  []  IV/IM Controlled Substances order (any 1)   []  One-time Order including Drug Name/Route, Reason Ordered:   []  Scheduled Order including Drug Name/Route, Reason Ordered or Continued:   []   PRN Order including Drug Name/Route, Reason Ordered or Continued:  [] Other -   []  Decision to De-escalate Care this DOS due to change in treatment goals:  []  Decision to Escalate Care To:   []  Major Surgery/Procedure (any 1):   Elective with patient risk factors including Procedure Type and Risk Factors:   Emergent Procedure Type:    ----------------------------------------------------------------------  C. Data (any 2)  []  Data Review (3+ points)  []  Consultant Note reviewed with note date, specialty, and summary (1 point each)  [x]  All current labs were reviewed and interpreted for clinical significance   []  Studies Reviewed (1 point each):   []  Collateral history obtained including from who and why needed (Max 1 point):  []  Independent (Jaikob Borgwardt DELENA Creighton, MD) Interpretation of tests (any 1)  []  Rhythm Strip (Telemetry) personally reviewed and interpreted as documented above    []  Imaging personally reviewed and interpreted, includes:    [x]  Discussion (any 1)  [x]  Discussed the discharge plan in detail with case management including timing/barriers to discharge, need for support services and/or placement decision   []  Discussed management of the case with:         Review of Systems:      Pertinent positives and negatives discussed in HPI    Objective:     Intake/Output Summary (Last 24 hours) at 04/04/2024 0925  Last data filed at 04/04/2024 0340  Gross per 24 hour   Intake 3033.62 ml   Output 5 ml   Net 3028.62 ml        Vitals:   Vitals:    04/03/24 1756 04/03/24 2122 04/04/24 0121 04/04/24 0530   BP:  137/73 129/61 (!) 141/87   Pulse:  (!) 122 (!) 118 (!) 117   Resp: 16 16 16 17    Temp:  (!) 100.9 F (38.3 C) 99.9 F (37.7 C) 100.2 F (37.9 C)   TempSrc:  Oral Oral Axillary   SpO2:  100% 99% 99%   Weight:       Height:             Physical Exam:      General: NAD  Eyes: EOMI  ENT: neck supple  Cardiovascular: Regular rate.  Respiratory: Clear to auscultation  Gastrointestinal: Soft, non  tender  Genitourinary: no suprapubic tenderness  Musculoskeletal: No edema  Skin: warm, dry  Neuro: Alert.  Psych: Mood appropriate.         Medications:   Medications:    insulin  glargine  30 Units SubCUTAneous Nightly    insulin  lispro  10 Units SubCUTAneous TID WC    [Held by provider] valACYclovir   500 mg Oral Daily    cetirizine   10 mg Oral Daily    montelukast   10 mg Oral Daily    ferrous sulfate   325 mg Oral Daily with breakfast    sodium chloride  flush  5-40 mL IntraVENous 2 times per day    aspirin   81 mg Oral BID    sodium chloride  flush  5-40 mL IntraVENous 2 times per day    insulin  lispro  0-8 Units SubCUTAneous 4x Daily AC & HS    pantoprazole   40 mg Oral QAM AC    nafcillin   2,000 mg IntraVENous Q4H    levothyroxine   25 mcg Oral Daily      Infusions:    sodium chloride       sodium chloride       sodium chloride       sodium chloride       dextrose  5 % and 0.45 % NaCl Stopped (03/28/24 2303)    dextrose        PRN Meds: furosemide , 20 mg, Daily PRN  sodium chloride  flush, 5-40 mL, PRN  sodium chloride , , PRN  acetaminophen , 650 mg, Q4H PRN  oxyCODONE , 5 mg, Q4H PRN   Or  oxyCODONE , 10 mg, Q4H PRN  HYDROmorphone , 0.25 mg, Q3H PRN   Or  HYDROmorphone , 0.5 mg, Q3H PRN  bisacodyl , 5 mg, Daily PRN  ondansetron , 4 mg, Q8H PRN   Or  ondansetron , 4 mg, Q6H PRN  sodium chloride , , PRN  potassium chloride , 40 mEq, PRN   Or  potassium alternative oral replacement, 40 mEq, PRN   Or  potassium chloride , 10 mEq, PRN  sodium chloride  flush, 5-40 mL, PRN  sodium chloride , , PRN  sodium chloride , , PRN  dextrose  bolus, 125 mL, PRN   Or  dextrose  bolus, 250 mL, PRN  magnesium  sulfate, 2,000 mg, PRN  sodium phosphate  15 mmol in sodium chloride  0.9 % 250 mL IVPB, 15 mmol, PRN  magnesium  sulfate, 2,000 mg, PRN  polyethylene glycol, 17 g, Daily PRN  dextrose  5 % and 0.45 % NaCl, , Continuous PRN  glucose, 4 tablet, PRN  glucagon , 1 mg, PRN  dextrose , , Continuous PRN  albuterol , 2.5 mg, Q6H PRN        Labs and Imaging   CT SOFT  TISSUE NECK WO CONTRAST  Result Date: 03/28/2024  DATE: March 28, 2024 EXAM: CT SOFT TISSUE NECK WO CONTRAST INDICATION: neck pain, fever; COMPARISON: None. TECHNIQUE: Axial CT imaging obtained from the skull base through the lung apices. Axial images and multiplanar reformatted images reviewed.   Up-to-date CT equipment and radiation dose reduction techniques were employed. IV Contrast: None FINDINGS: SCOUT: Nondiagnostic scout image(s) obtained for localization purposes, not for diagnostic use DEVICES/HARDWARE: None . ORBITS, PARANASAL SINUSES, SKULL BASE: Limited images of the base of the brain demonstrate no mass effect. Orbits are normal. Imaged sinuses are clear. CERVICAL SPINE/BONES: No destructive process. NASAL CAVITY/NASOPHARYNX: No mass. ORAL CAVITY/OROPHARYNX: No evidence of mass. HYPOPHARYNX AND LARYNX: Normal. DEEP NECK SPACES: The parapharyngeal, retropharyngeal and masticator spaces are normal. SALIVARY GLANDS: Submandibular glands, sublingual glands and parotid glands are normal. THYROID GLAND: Normal. LYMPH NODES: Nonenlarged cervical lymph nodes bilaterally. THORACIC INLET: No mass or acute abnormality involving the visualized mediastinal structures, trachea or lung apices. OTHER: No significant abnormality.     1. Negative. This dictation was created with voice recognition software.  While attempts have been made to review the dictation as it was transcribed, occasionally the spoken word can be misinterpreted  by the technology, leading to omissions or inappropriate words or  phrases.  If questions or concerns arise, please contact the Radiology Department. Electronically signed by Elouise Hugh, MD    CT ABDOMEN PELVIS WO CONTRAST Additional Contrast? None  Result Date: 03/28/2024  DATE: 03/28/2024 EXAM: CT ABDOMEN PELVIS WO CONTRAST INDICATION: hip pain Pain COMPARISON: 2015 TECHNIQUE: Axial CT imaging obtained from lung bases through pelvis. Axial images and multiplanar reformatted images  are provided for review.  Up-to-date CT equipment and radiation dose reduction techniques were employed. IV Contrast: None Oral Contrast: No. FINDINGS: SCOUT: Nondiagnostic scout image(s) obtained for localization purposes, not for diagnostic use DEVICES/HARDWARE: None . LUNG BASES: Clear. LIVER: There is decreased in attenuation. Liver is up to 25 cm in craniocaudal dimension. GALLBLADDER AND BILIARY TREE: No calcified gallstones. No gallbladder distention.  No intra- or extrahepatic biliary dilatation. PANCREAS: Normal. SPLEEN: Spleen is up to 14 cm in craniocaudal dimension. ADRENAL GLANDS: Normal. KIDNEYS AND URETERS: No hydronephrosis. Focal areas of striated enhancement of the kidneys bilaterally. Mild perinephric haziness bilaterally without fluid collection. BOWEL: Normal diameter, nonobstructed.  Appendix is not dilated or thickened. Distal colonic diverticulosis. URINARY BLADDER: Normal. REPRODUCTIVE ORGANS: No associated masses. LYMPH NODES: No abnormally enlarged nodes. PERITONEUM/RETROPERITONEUM: No ascites or free air. VESSELS: Aorta and IVC without significant abnormality.  ABDOMINAL WALL: Mild periarticular edema or swelling of the left hip. BONES: No significant abnormality. OTHER FINDINGS: None.     1. Diverticulosis of the colon without evidence of diverticulitis. 2. Focal areas of striated enhancement of the kidneys, previous CT contrast administration. Findings could indicate renal dysfunction or infection. 3. Mild periarticular edema or swelling of the left hip suggesting muscular injury/strain. 4. Hepatosplenomegaly. This dictation was created with voice recognition software.  While attempts have been made to review the dictation as it was transcribed, occasionally the spoken word can be misinterpreted  by the technology, leading to omissions or inappropriate words or phrases.  If questions or concerns arise, please contact the Radiology Department. Electronically signed by Elouise Hugh,  MD    CT HEAD WO CONTRAST  Result Date: 03/28/2024  EXAM: CT HEAD WO CONTRAST STUDY DATE: 03/28/2024 11:15 EST INDICATION: neck pain COMPARISON: None TECHNIQUE: Standard per department protocol. Up-to-date CT equipment and radiation dose reduction techniques were employed. CONTRAST: No intravenous FINDINGS: Brain Parenchyma: No midline shift, mass effect, parenchymal hemorrhage, or evidence of acute territorial infarct. Ventricular System and Extra-Axial Spaces: No extra-axial fluid collections. Basilar cisterns are patent. No hydrocephalus. Extracranial Structures: No significant paranasal sinus disease. No calvarial abnormality. No orbit abnormality.     *  No acute intracranial findings. Electronically signed by Isom Repress    CT CHEST PULMONARY EMBOLISM W CONTRAST  Result Date: 03/27/2024  DATE: 03/27/2024 EXAM: CT CHEST PULMONARY EMBOLISM W CONTRAST INDICATION: c/f PE, Chest pain COMPARISON: None. TECHNIQUE: Axial CT imaging obtained through the chest using CT pulmonary angiogram protocol. Axial images, multiplanar reformatted images and maximum intensity projection images were reviewed.  Up-to-date CT equipment and radiation dose reduction techniques were employed.  IV Contrast Media and volume: 80 cc iodinated contrast FINDINGS: SCOUT: Nondiagnostic scout image(s) obtained for localization purposes, not for diagnostic use DEVICES/HARDWARE: None . DIAGNOSTIC QUALITY: Suboptimal. PULMONARY EMBOLI: None. RIGHT HEART STRAIN: None. LUNGS AND AIRWAYS: Airways are patent.  Lungs are clear. PLEURA: No pleural effusions or significant pleural thickening. HEART / GREAT VESSELS: Heart is not enlarged. ADENOPATHY: None. CHEST WALL / LOWER NECK: No significant abnormality. UPPER ABDOMEN: Normal. BONES: No significant abnormality. OTHER FINDINGS: None.     1. No evidence of  pulmonary embolus. This dictation was created with voice recognition software.  While attempts have been made to review the dictation as it was  transcribed, occasionally the spoken word can be misinterpreted  by the technology, leading to omissions or inappropriate words or phrases.  If questions or concerns arise, please contact the Radiology Department. Electronically signed by Elouise Hugh, MD    XR CHEST PORTABLE  Result Date: 03/27/2024  Chest HISTORY : Dyspnea VIEWS : 1     Clear lungs. Normal cardiac mediastinal silhouette. Electronically signed by Lynwood Schwalbe, MD      CBC:   Recent Labs     04/02/24  0656 04/03/24  0424 04/04/24  0505   WBC 16.7* 15.9* 12.4*   HGB 7.0* 9.2* 9.0*   PLT 583* 610* 683*     BMP:    Recent Labs     04/01/24  1042 04/01/24  1952 04/02/24  0656   NA 135*  --  137   K 3.0* 3.6 3.2*   CL 96*  --  98*   CO2 28  --  28   BUN 5*  --  4*   CREATININE 0.6  --  0.5*   GLUCOSE 228*  --  105*     Hepatic: No results for input(s): AST, ALT, BILITOT, ALKPHOS in the last 72 hours.    Invalid input(s): ALB  Lipids:   Lab Results   Component Value Date/Time    CHOL 154 11/25/2023 02:44 PM    HDL 22 11/25/2023 02:44 PM    TRIG 170 11/25/2023 02:44 PM     Hemoglobin A1C:   Lab Results   Component Value Date/Time    LABA1C 13.9 03/27/2024 03:47 AM     TSH:   Lab Results   Component Value Date/Time    TSH 1.92 08/27/2022 04:35 PM     Troponin: No results found for: TROPONINT  Lactic Acid:   No results for input(s): LACTA in the last 72 hours.    BNP: No results for input(s): PROBNP in the last 72 hours.  UA:  Lab Results   Component Value Date/Time    NITRU Negative 03/28/2024 02:03 AM    COLORU Yellow 03/28/2024 02:03 AM    PHUR 6.0 03/28/2024 02:03 AM    PHUR 5.0 03/28/2024 02:03 AM    PHUR 5.5 02/25/2017 12:22 PM    WBCUA 3-5 03/28/2024 02:03 AM    RBCUA 5-10 03/28/2024 02:03 AM    BACTERIA 3+ 03/28/2024 02:03 AM    CLARITYU Clear 03/28/2024 02:03 AM    LEUKOCYTESUR TRACE 03/28/2024 02:03 AM    UROBILINOGEN 0.2 03/28/2024 02:03 AM    BILIRUBINUR Negative 03/28/2024 02:03 AM    BLOODU SMALL 03/28/2024 02:03 AM     GLUCOSEU Negative 03/28/2024 02:03 AM    KETUA Negative 03/28/2024 02:03 AM     Urine Cultures:   Lab Results   Component Value Date/Time    LABURIN 25,000 CFU/ml  PBP2= Negative   03/28/2024 01:54 AM     Blood Cultures:   Lab Results   Component Value Date/Time    [facility]  03/29/2024 02:48 PM     POSITIVE  Isolated one of two sets  No further workup  Refer to culture #[id number] for sensitivity results       Lab Results   Component Value Date/Time    BLOODCULT2 No Growth after 4 days of incubation. 03/30/2024 04:21 PM     Organism:   Lab Results  Component Value Date/Time    ORG Staphylococcus aureus 04/02/2024 02:09 PM         Electronically signed by Marne DELENA Creighton, MD on 04/04/2024 at 9:25 AM  Comment: Please note this report has been produced using speech recognition software and may contain errors related to that system including errors in grammar, punctuation, and spelling, as well as words and phrases that may be inappropriate. If there are any questions or concerns, please feel free to contact the dictating provider for clarification.   "

## 2024-04-05 LAB — CBC WITH AUTO DIFFERENTIAL
Basophils %: 0.8 %
Basophils Absolute: 0.1 K/uL (ref 0.0–0.2)
Eosinophils %: 0.8 %
Eosinophils Absolute: 0.1 K/uL (ref 0.0–0.6)
Hematocrit: 27.1 % — ABNORMAL LOW (ref 36.0–48.0)
Hemoglobin: 8.8 g/dL — ABNORMAL LOW (ref 12.0–16.0)
Lymphocytes %: 11.7 %
Lymphocytes Absolute: 1.5 K/uL (ref 1.0–5.1)
MCH: 26.4 pg (ref 26.0–34.0)
MCHC: 32.6 g/dL (ref 31.0–36.0)
MCV: 81 fL (ref 80.0–100.0)
MPV: 7.5 fL (ref 5.0–10.5)
Monocytes %: 5.8 %
Monocytes Absolute: 0.7 K/uL (ref 0.0–1.3)
Neutrophils %: 80.9 %
Neutrophils Absolute: 10.1 K/uL — ABNORMAL HIGH (ref 1.7–7.7)
Platelets: 622 K/uL — ABNORMAL HIGH (ref 135–450)
RBC: 3.34 M/uL — ABNORMAL LOW (ref 4.00–5.20)
RDW: 16.4 % — ABNORMAL HIGH (ref 12.4–15.4)
WBC: 12.5 K/uL — ABNORMAL HIGH (ref 4.0–11.0)

## 2024-04-05 LAB — POCT GLUCOSE
POC Glucose: 148 mg/dL — ABNORMAL HIGH (ref 70–99)
POC Glucose: 176 mg/dL — ABNORMAL HIGH (ref 70–99)
POC Glucose: 128 mg/dL — ABNORMAL HIGH (ref 70–99)
POC Glucose: 115 mg/dL — ABNORMAL HIGH (ref 70–99)
POC Glucose: 145 mg/dL — ABNORMAL HIGH (ref 70–99)

## 2024-04-05 MED ORDER — LIDOCAINE HCL (PF) 1 % IJ SOLN
1 | Freq: Once | INTRAMUSCULAR | Status: AC
Start: 2024-04-05 — End: 2024-04-05
  Administered 2024-04-05: 21:00:00 5 mL via INTRADERMAL

## 2024-04-05 MED FILL — ACETAMINOPHEN 325 MG PO TABS: 325 mg | ORAL | Qty: 2 | Fill #0

## 2024-04-05 MED FILL — OXYCODONE HCL 5 MG PO TABS: 5 mg | ORAL | Qty: 2 | Fill #0

## 2024-04-05 MED FILL — NAFCILLIN SODIUM 2 G IJ SOLR: 2 g | INTRAMUSCULAR | Qty: 2000 | Fill #0

## 2024-04-05 MED FILL — INSULIN LISPRO 100 UNIT/ML IJ SOLN: 100 [IU]/mL | INTRAMUSCULAR | Qty: 10 | Fill #0

## 2024-04-05 MED FILL — CETIRIZINE HCL 10 MG PO TABS: 10 mg | ORAL | Qty: 1 | Fill #0

## 2024-04-05 MED FILL — PANTOPRAZOLE SODIUM 40 MG PO TBEC: 40 mg | ORAL | Qty: 1 | Fill #0

## 2024-04-05 MED FILL — LEVOTHYROXINE SODIUM 25 MCG PO TABS: 25 ug | ORAL | Qty: 1 | Fill #0

## 2024-04-05 MED FILL — OXYCODONE HCL 5 MG PO TABS: 5 mg | ORAL | Qty: 1 | Fill #0

## 2024-04-05 MED FILL — FERROUS SULFATE 325 (65 FE) MG PO TABS: 325 (65 Fe) MG | ORAL | Qty: 1 | Fill #0

## 2024-04-05 MED FILL — ASPIRIN LOW DOSE 81 MG PO TBEC: 81 mg | ORAL | Qty: 1 | Fill #0

## 2024-04-05 MED FILL — LANTUS 100 UNIT/ML SC SOLN: 100 [IU]/mL | SUBCUTANEOUS | Qty: 40 | Fill #0

## 2024-04-05 MED FILL — MONTELUKAST SODIUM 10 MG PO TABS: 10 mg | ORAL | Qty: 1 | Fill #0

## 2024-04-05 NOTE — Discharge Instructions (Addendum)
 "Caring for Your Peripherally Inserted Central Catheter (PICC)    It was a pleasure helping you.  Call Your Primary Infusion Nurse or Ordering Doctor with any questions. If there are any questions for the Vascular Access Team here at [facility] our number is [phone] leave a message and we will call you back as soon as possible.    We are available Monday through Friday 730 to 5pm.      You are going home with a peripherally inserted catheter (PICC). This small, soft tube has been placed in a vein in your arm. It is often used when treatment requires medications or nutrition for weeks or months. At home, you need to take care of your PICC to keep it working. Since a PICC line can have an infection risk, you must take extra care washing your hands and preventing the spread of germs. This sheet will help you remember what to do to care for your PICC at home.      Understanding Your Role  A nurse or other healthcare provider will teach you and your caregivers how to care for the PICC. Before leaving the hospital, make sure that you understand what to do at home, how long you may need the PICC, and when to have a follow up visit.  You will likely be told to flush the PICC with saline or heparin solution. You may also be told to change the catheter's injection caps and change the dressing (Bandage). Or, a nurse may do this for you during a follow-up visit. Only do these things if you are told to, following the instructions you were given.    Protecting the PICC  If the PICC gets damaged, it won't work right and could raise you chance of infection. Call your healthcare team right away if any damage occurs. To protect your PICC at home:  Prevent infection. Use good hand hygiene by following the guidelines on this sheet. Don't touch the catheter or the dressing unless you need to. Always clean your hands before and after you come in contact with any part of the PICC. Your caregivers, family members, any visitors should use  good hand hygiene also.  Keep the PICC dry. The catheter and dressing must stay dry. Don't take baths, go swimming, use a hot tub, or do other activities that could get the PICC wet. When showering, cover the area with plastic wrap or another cover as recommended by your healthcare provider. Keep the area out of the water  spray. If the dressing gets wet, change it only if you have been shown how. Otherwise, call your healthcare team right away.  Avoid damage. Don't use any sharp or pointy objects around the catheter. This includes scissors, pins, knives, razors, or anything else that could puncture or cut it. Also, don't let anything pull or rub on the catheter, such as clothing.  Watch for signs of problems. Pay attention to how much of the catheter that sticks out from your skin. If this changes at all, let your healthcare provider know. Also watch for cracks, leaks, or other damage. If the dressing becomes dirty, loose, or wet, change it (if you have been instructed to) or call your healthcare team.  Avoid lowering your chest below your waist. This included bending at the waist for actions like tying your shoes. When your chest is positioned below your waist, especially for a long time, the catheter's internal tip could slip out of place in the vein.  Tell your healthcare  team if you vomit or have severe coughing. This can make the catheter slip out of place.    Protecting Your Arm  The arm with the PICC is at risk for developing blood clots (thrombosis). This is a serious complication. To help prevent it:SABRA As much as possible, use the arm with the PICC in it for normal daily activities. Lack of movement can lead to blood clots, so it's important to move your arm as you normally would.  Avoid activities or exercises that require major use of your arm, such as sports, unless your healthcare provider says it's okay.  Avoid activities that cause discomfort in your arm. Talk to your healthcare team if you have  concerns about pain or range of motion.  Don't lift anything heavier than 10 pounds with the affected arm.  Drink plenty of water . Staying hydrated helps keep blood clots from forming.    Prevent Infection with Good Hand Hygiene  A PICC can let germs into your body. This can lead to serious and sometimes deadly infections. To prevent infection, it's very important that you, your caregivers and others around you use good hand hygiene. This means washing your hands well with soap and water , and cleaning them with alcohol based hand gel as directed. Never touch the PICC or dressing without first using one of the methods.   To wash your hands with soap and water :  Wet your hands with warm water . (Avoid hot water , which can cause skin irritation when you wash your hands often).  Apply enough soap to cover the entire surface of your hands, including fingers.  Rub your hands together vigorously for at least 15 seconds. Make sure to rub the front and back of each hand up to the wrist, your fingers and fingernails, between the fingers, and each thumb.  Rinse your hands with warm water   Dry your hands completely with a new paper towel. Don't use a cloth towel or other reusable towel. These can harbor  germs.  Use the paper towel to turn off the faucet, then throw it away. If you are in a bathroom, also use a paper towel to open the door instead of touching the handle.  When you don't have access to soap and water : Use alcohol-based hand gel. The gel should have at least 60% alcohol. Follow the instructions on the package. Your healthcare team can answer any questions you have about when to use hand gel, or when it's better to wash with soap and water .                                         When to Call Your Healthcare Provider  Call your provider right away if you have any of the following:  Pain or burning in your shoulder, chest, back, arm, or leg  Fever of 100.4 F or higher  Chills  Signs of infection at the catheter site  (pain, redness, drainage, burning, or stinging  Coughing, wheezing, or shortness of breath  A racing or irregular heartbeat  Muscle stiffness or trouble moving  Tightness in your arm, above the catheter site  Gurgling noises coming from the catheter  The catheter falls out, breaks, cracks, leaks, or has other damage         Please go to outpatient infusion at [facility] on April 08, 2024 for first appointment at 0830 and next appointment at  0930 on 04/09/24.  "

## 2024-04-05 NOTE — Plan of Care (Signed)
"    Problem: Chronic Conditions and Co-morbidities  Goal: Patient's chronic conditions and co-morbidity symptoms are monitored and maintained or improved  Outcome: Progressing     Problem: Discharge Planning  Goal: Discharge to home or other facility with appropriate resources  Outcome: Progressing     Problem: Safety - Adult  Goal: Free from fall injury  04/05/2024 2109 by Reatha Hole, RN  Outcome: Progressing     Problem: Pain  Goal: Verbalizes/displays adequate comfort level or baseline comfort level  04/05/2024 2109 by Reatha Hole, RN  Outcome: Progressing     Problem: Nutrition Deficit:  Goal: Optimize nutritional status  Outcome: Progressing     Problem: ABCDS Injury Assessment  Goal: Absence of physical injury  Outcome: Progressing  Flowsheets (Taken 04/05/2024 2100)  Absence of Physical Injury: Implement safety measures based on patient assessment     Problem: Neurosensory - Adult  Goal: Achieves stable or improved neurological status  Outcome: Progressing     Problem: Neurosensory - Adult  Goal: Achieves maximal functionality and self care  Outcome: Progressing     Problem: Cardiovascular - Adult  Goal: Maintains optimal cardiac output and hemodynamic stability  Outcome: Progressing     Problem: Cardiovascular - Adult  Goal: Absence of cardiac dysrhythmias or at baseline  Outcome: Progressing     Problem: Skin/Tissue Integrity - Adult  Goal: Skin integrity remains intact  Outcome: Progressing     Problem: Skin/Tissue Integrity - Adult  Goal: Oral mucous membranes remain intact  Outcome: Progressing     Problem: Musculoskeletal - Adult  Goal: Return mobility to safest level of function  Outcome: Progressing     Problem: Musculoskeletal - Adult  Goal: Maintain proper alignment of affected body part  Outcome: Progressing     Problem: Musculoskeletal - Adult  Goal: Return ADL status to a safe level of function  Outcome: Progressing     "

## 2024-04-05 NOTE — Plan of Care (Signed)
"    Problem: Safety - Adult  Goal: Free from fall injury  Outcome: Progressing   Fall precautions in place. Bed alarm on and in lowest position. Call light, belongings, bedside table within reach.       Problem: Pain  Goal: Verbalizes/displays adequate comfort level or baseline comfort level  Outcome: Progressing   Assessing pain using numeric pain scale. Managing pain with meds per MAR. Using repositioining and pillow support for comfort.  "

## 2024-04-05 NOTE — Progress Notes (Signed)
"  Pt A&Ox4, VSS on RA, with exception of tachycardia and fever. Pt ambulates x1 walker/GB. Pt voiding and tolerating PO fluids. Managing pain with PRN pain meds per MAR. Fall precautions in place. Bed alarm on and in lowest position. Plan of care continues   "

## 2024-04-05 NOTE — Progress Notes (Signed)
"  Patient is A&Ox4. VSS on RA. Patient has endorsed pain to left hip managed  per [facility] and non-pharm measures. Patient is tolerating PO diet. Tolerating ambulation x 1 assist with GB/walker. Voiding via BRP.CHG bath and line care performed. Pt denies any needs at this time.Patient updated on plan of care. Fall and safety precautions in place, call light within reach. Continuing care.  "

## 2024-04-05 NOTE — Progress Notes (Signed)
 "ID Follow-up NOTE    CC:   DKA, left hip pain  Antibiotics: Nafcillin     Admit Date: 03/26/2024  Hospital Day: 11    Subjective:     Patient is status post left hip bursa washout on 12/19. states pain better still there, though not worse.       Objective:     Patient Vitals for the past 8 hrs:   BP Temp Temp src Pulse Resp SpO2   04/05/24 1200 122/75 99.3 F (37.4 C) Oral (!) 107 16 96 %   04/05/24 0945 -- -- -- -- 16 --   04/05/24 0845 -- -- -- -- 16 --   04/05/24 0830 (!) 144/79 99.1 F (37.3 C) Oral (!) 114 16 97 %     I/O last 3 completed shifts:  In: 3373.6 [P.O.:1040; I.V.:162.1; IV Piggyback:2171.5]  Out: 30 [Drains:30]  No intake/output data recorded.    EXAM:  GENERAL: No apparent distress.    HEENT: Membranes moist, no oral lesion  NECK:  Supple, no lymphadenopathy  LUNGS: Clear b/l, no rales, no dullness  CARDIAC: RRR  ABD:  + BS, soft   EXT:  No rash, slight left hip pain.   NEURO: No focal neurologic findings  PSYCH: Orientation, sensorium, mood normal  LINES:  Peripheral iv       Data Review:  Lab Results   Component Value Date    WBC 12.5 (H) 04/05/2024    HGB 8.8 (L) 04/05/2024    HCT 27.1 (L) 04/05/2024    MCV 81.0 04/05/2024    PLT 622 (H) 04/05/2024     Lab Results   Component Value Date    CREATININE 0.5 (L) 04/02/2024    BUN 4 (L) 04/02/2024    NA 137 04/02/2024    K 3.2 (L) 04/02/2024    CL 98 (L) 04/02/2024    CO2 28 04/02/2024       Hepatic Function Panel:   Lab Results   Component Value Date/Time    ALKPHOS 154 11/25/2023 02:44 PM    ALT 16 11/25/2023 02:44 PM    AST 15 11/25/2023 02:44 PM    BILITOT 0.7 11/25/2023 02:44 PM    BILIDIR <0.2 02/03/2014 06:04 PM    IBILI see below 02/03/2014 06:04 PM       MICRO:  Blood cultures x 212/21: No growth to date    Or culture left hip bursa 12/19: Rare growth Staph aureus, MSSA    Blood cultures x 2 12/16: No growth    Left hip bursa aspirate culture 12/18: Moderate growth MSSA    Blood cultures x 2 12/15: Positive for MSSA    Blood cultures x 2  12/13: Positive for MSSA  Staphylococcus aureus (2)    Antibiotic Interpretation MIC  Method Status    clindamycin  Sensitive <=0.25 mcg/mL BACTERIAL SUSCEPTIBILITY PANEL BY MIC     erythromycin  Sensitive <=0.25 mcg/mL BACTERIAL SUSCEPTIBILITY PANEL BY MIC     levofloxacin Resistant 4 mcg/mL BACTERIAL SUSCEPTIBILITY PANEL BY MIC     linezolid Sensitive 2 mcg/mL BACTERIAL SUSCEPTIBILITY PANEL BY MIC     oxacillin Sensitive 0.5 mcg/mL BACTERIAL SUSCEPTIBILITY PANEL BY MIC     tetracycline Sensitive <=1 mcg/mL BACTERIAL SUSCEPTIBILITY PANEL BY MIC      Tetracyclines have decreased renal elimination and  urinary bladder concentrations.  Use of this class of  agents for urinary tract infection should be limited to  patients that cannot tolerate an alternative therapy.  trimethoprim-sulfamethoxazole Sensitive <=10 mcg/mL BACTERIAL SUSCEPTIBILITY PANEL BY MIC     vancomycin  Sensitive 1 mcg/mL BACTERIAL SUSCEPTIBILITY PANEL BY MIC       Urine culture 12/14: 25,000 CFU MSSA      IMAGING: I have independently reviewed the images and reports.       MRI left hip with and without contrast 12/17:  1. There is a 4.6 cm left greater trochanter bursal fluid collection with thin tract which extends superiorly into the left gluteus medius muscle. There is extensive surrounding edema as well as edema within the left gluteus medius muscle, and lateral   aspect of the adjacent left gluteus maximus muscle. In the setting of infection this is most worrisome for a septic bursitis-abscess.  2. No evidence of any osteomyelitis.  3. No acute osseous abnormality.    X-ray left hip 12/16:  1. No acute fracture or dislocation.  2. Mild degenerative changes of the hips.    CT abdomen pelvis without contrast 12/14:  1. Diverticulosis of the colon without evidence of diverticulitis.  2. Focal areas of striated enhancement of the kidneys, previous CT contrast administration. Findings could indicate renal dysfunction or infection.  3. Mild  periarticular edema or swelling of the left hip suggesting muscular injury/strain.  4. Hepatosplenomegaly.    CT chest PE 12/13:  No evidence of pulmonary embolus.     Scheduled Meds:   insulin  glargine  40 Units SubCUTAneous Nightly    insulin  lispro  10 Units SubCUTAneous TID WC    [Held by provider] valACYclovir   500 mg Oral Daily    cetirizine   10 mg Oral Daily    montelukast   10 mg Oral Daily    ferrous sulfate   325 mg Oral Daily with breakfast    sodium chloride  flush  5-40 mL IntraVENous 2 times per day    aspirin   81 mg Oral BID    sodium chloride  flush  5-40 mL IntraVENous 2 times per day    insulin  lispro  0-8 Units SubCUTAneous 4x Daily AC & HS    pantoprazole   40 mg Oral QAM AC    nafcillin   2,000 mg IntraVENous Q4H    levothyroxine   25 mcg Oral Daily       Continuous Infusions:   sodium chloride       sodium chloride       sodium chloride       sodium chloride       dextrose  5 % and 0.45 % NaCl Stopped (03/28/24 2303)    dextrose          PRN Meds:  furosemide , sodium chloride  flush, sodium chloride , acetaminophen , oxyCODONE  **OR** oxyCODONE , HYDROmorphone  **OR** HYDROmorphone , bisacodyl , ondansetron  **OR** ondansetron , sodium chloride , potassium chloride  **OR** potassium alternative oral replacement **OR** potassium chloride , sodium chloride  flush, sodium chloride , sodium chloride , dextrose  bolus **OR** dextrose  bolus, magnesium  sulfate, sodium phosphate  15 mmol in sodium chloride  0.9 % 250 mL IVPB, magnesium  sulfate, polyethylene glycol, dextrose  5 % and 0.45 % NaCl, glucose, glucagon , dextrose , albuterol       Assessment:       Patient Active Problem List   Diagnosis    Mixed hyperlipidemia    Iron  deficiency anemia    Type 2 diabetes mellitus with hyperglycemia, with long-term current use of insulin  (HCC)    Essential hypertension    Acquired hypothyroidism    Mild intermittent asthma without complication    Allergic rhinitis    Heart murmur    Uterine fibroid    Class 1 obesity due to  excess calories  with serious comorbidity and body mass index (BMI) of 31.0 to 31.9 in adult    Abnormal uterine bleeding    Eczema    Glaucoma suspect, bilateral    Hidradenitis axillaris    HSV-2 infection    Myopic astigmatism, bilateral    Strabismus    S/P myomectomy    DKA, type 2, not at goal [facility])    Acute nonintractable headache    MSSA bacteremia    Diabetic ketoacidosis without coma associated with type 2 diabetes mellitus (HCC)    Left hip pain    Abscess of bursa of left hip        Plan:   38 year old African-American female with history of DM2, HTN, HLD that presented on 12/13 with headache and shortness of breath.  Patient has recently lost her health insurance since 12/2023 and was not taking any of her medications.  She was found to be in DKA and was admitted to the ICU.      MSSA bacteremia and left hip greater trochanteric bursitis:  - upon presentation, she was also noted to be tachycardic, hypertensive and had leukocytosis.    - Infectious workup with blood cultures 2 of 2 sets obtained on admission positive for MSSA.   - Patient was initially started on vancomycin  which has now been transitioned to nafcillin .    - In speaking to the patient and is unclear what the source of MSSA bacteremia.  She denies any recent skin or soft tissue infections, no skin tears, she has several tattoos noted which are not new.    - She adamantly denies any form of drug use.  UDS on admission was negative.    - Of note urine culture also showing 25,000 CFU of MSSA which is likely overflow effect from hematogenous infection.    - Her main complaint at this time is left hip pain and this is worsened with weightbearing.    - we rec MRI which showed 4.6cm left greater trochanteric bursal fluid collection.  This was aspirated with IR and yielded growth of MSSA.    - orthopedics evaluated and she was taken for operative washout of the bursa on 12/19 with operative cultures showing growth of the same.  -We will continue nafcillin .   Repeated sets of blood cultures have remained negative.  TTE without any evidence of IE  - Given patient's lack of insurance, she will not qualify for home health and IV antibiotics at home and will likely need to come in to [facility] via charity care for IV antibiotics.   - Given this, we will plan to change to daptomycin  when ready for discharge due to its once a day administration.   - s/p test dose dapto on 12/18 and COC in place to start the process of possible charity care.   - Will plan PICC line placement today.     DM2:  - Good glycemic control to aid in healing and prevention of further infections.  - A1c on this admission was 13.9      INFUSION ORDERS:  - Drug: IV daptomycin  400 mg daily  - Planned End date: 04/30/2024  - Diagnosis: MSSA bacteremia, left hip septic bursitis   - Has received test dose in hospital  - Routine line maintenance  - Check CBC w diff, CMP, ESR, CRP, CK  every Mon or Tue - FAX result to [phone]  - Call with antibiotic / infusion issues, [phone]  - Call with any change  in status, transfer in or out of a facility or to hospital; This COC is only valid for current disposition - [phone].    - No f/u in outpatient ID office necessary       Medical Decision Making:  The following items were considered in medical decision making:  Discussion of patient care with other providers  Reviewed clinical lab tests  Reviewed radiology tests  Reviewed other diagnostic tests/interventions  Independent review of radiologic images  Microbiology cultures and other micro tests reviewed      Please note that this chart was generated using Dragon dictation software. Although every effort was made to ensure the accuracy of this automated transcription, some errors in transcription may have occurred inadvertently.  Any pictures or media included in this note were obtained after taking informed verbal consent from the patient and with their approval to include those in the patient's medical record.      Discussed with patient, SW and primary team, Dr. Arne.  Flois Mctague, MD   "

## 2024-04-05 NOTE — Procedures (Signed)
"                                                                   SINGLE PICC PROCEDURE NOTE  Chart reviewed for allergies, diagnosis, labs, known contraindications, reason for line placement and planned length of treatment.  Informed consent noted to be signed and on chart.  Insertion procedure discussed with patient/family member.  Three patient identifiers - Patient name,  DOB and MRN -  completed &  confirmed verbally.         Time out performed.  Hat, mask and eye shield donned.  PICC site cleaned with chlorhexidine wipes then scrubbed with Chloraprep one-step applicator for 30 seconds x 2.   Hand hygiene performed prior to sterile gown and gloves being donned.  Patient draped using maximal sterile barrier technique.  PICC site scrubbed  with Chloraprep One-Step applicator x 30 sec after draping.   1% Lidocaine  5 ml injected intradermal pre-insertion.  Modified Seldinger technique/ultrasound assist performed with vein access.  3CG/ECG technology locating system utilized for insertion and PICC tip location in the SVC.   Positive brisk blood return obtained from all lumens.  Valves placed to all lumens and flushed with 10 mls 0.9% Sterile Sodium Chloride .  All lumens flush easily with no resistance.  Skin prep applied to site.   Catheter secured with non-sutured locking device per hospital protocol. Bio-patch/CHG impregnated sterile tegaderm dressing applied.  Alcohol Swab Caps applied to each valve.  Sterile field maintained during procedure.  PICC insertion, rhythm and positioning wire accounted for post procedure and disposed of in sharps.  Appearance of site is Clean dry and intact without bleeding or edema. All edges of Tegaderm occlusive.   Site marked with date and initials of RN placing line. Teaching performed to patient/family and noted in education section.   Bed placed in low position post-procedure. Top 2 side rails in up position call button in reach.  Patient response to procedure tolerated well.   RN notified of all of the above.  A Single Lumen Power Injectable PICC was trimmed at 41 CM and place RUA per basilic vein, 3 cm showing from insertion site.                              "

## 2024-04-05 NOTE — Progress Notes (Signed)
 "    V2.0    US[facility] Progress Note      Name:  Shelly Richard DOB/Age/Sex: [birth year]  (38 y.o. female)   MRN & CSN:  [id number] & [id number] Encounter Date/Time: 04/05/2024 12:21 PM EST   Location:  5514/5514-01 PCP: Alexandro Bernice FORBES, [name]     Attending:Hanna Ra, Marne LABOR, MD       Hospital Day: 11  Subjective:     Chief Complaint: Sepsis    Shelly Richard is a 38 y.o. female who presents with sepsis    Subjective:  Patient feels a little better.  Appetite improving  Spiking a temp again , fever of 100.6 100.4  Still tachycardic  Still has some hip pain    Blood sugars better controlled      Assessment and Recommendations   HPI per admitting provider :      Hospital course:    Shelly Richard is a 38 y.o. female with pmh of diabetes, hypertension, hyperlipidemia who presents with DKA, type 2, not at goal [facility]) with sepsis.  Patient has not been taking her medications due to insurance issues.  DKA was treated with an insulin  drip and now switched to subcutaneous insulin .  Workup revealed MSSA bacteremia.  Patient CT scan showed some fluid around the hip MRI confirmed possible trochanteric bursal infection.  Underwent aspiration on 12/18, culture grew staph.  Underwent I&D of abscess on 12/19      Plan:     DKA resolved    Uncontrolled type 2 diabetes-blood sugars controlled on current regimen, will need charity meds on discharge  - On Lantus  with Premeal insulin   - On sliding scale insulin   - As patient is on insulin  monitor blood sugars hypoglycemia  - A1c 13.9    Sepsis-white count trending down started to spike a fever again  - Present on admission      MSSA bacteremia-with patient spiking fever ,repeat blood cultures ordered pending-patient will need charity IV antibiotics social worker aware  - Was initially on vancomycin  switched to nafcillin  on 12/15  - Repeat blood culture pending positive on 12/15, repeat blood culture on 12/16 negative  - TEE normal EF no vegetation      Acute on chronic  anemia-posttransfusion hemoglobin stable around 7  -Hemoglobin dropped from 10.9 to 5.7  -Iron  studies shows iron  deficiency but with bacteremia will hold off IV iron   - In the past patient needed iron  transfusions  - In the past patient had a lot of menstrual bleeding and was on Femara for fibroid    Hypothyroidism  -Resume low-dose Levothyroid    Hip pain-with septic bursitis-monitor drain output, culture positive for staph  -s/p I&D on 12/19 culture positive for staph  - CT shows some fluid collection  - MRI done on 12/17 shows a 4.6 cm left greater trochanteric bursal fluid collection which tracks into the gluteus muscle with surrounding edema concern for septic bursitis/abscess              I spent _   minutes in the care of this patient.  Over 50% of that time was in face-to-face counseling regarding disease process, diagnostic testing, preventative measures, and answering patient and family questions.     Diet ADULT DIET; Regular; 3 carb choices (45 gm/meal)   DVT Prophylaxis []  Lovenox , []   Heparin, []  SCDs, []  Ambulation,  []  Eliquis, []  Xarelto  []  Coumadin   Code Status Full Code   Disposition  Unclear     Surrogate Decision Maker/ POA Spouse       MDM  [x]  High (any 2 of A, B, or C)    A. Problems (any 1)  [x]  Acute/Chronic Illness/injury posing threat to life or bodily function:    []  Severe exacerbation of chronic illness:    ---------------------------------------------------------------------  B. Risk of Treatment (any 1)   []  IV ABX requiring serial renal monitoring for nephrotoxicity:     [x]  IV Narcotic analgesia for adverse drug reaction  []  IV diuresis requiring serial monitoring for renal impairment and electrolyte derangements  []  Critical electrolyte abnormalities requiring IV replacement and close serial monitoring  [x]  Insulin  - monitoring serial FSBS for Hypoglycemic adverse drug reaction  []  Anticoagulation requiring serial monitoring of coagulation factors  []  IV/IM Controlled  Substances order (any 1)   []  One-time Order including Drug Name/Route, Reason Ordered:   []  Scheduled Order including Drug Name/Route, Reason Ordered or Continued:   []  PRN Order including Drug Name/Route, Reason Ordered or Continued:  [] Other -   []  Decision to De-escalate Care this DOS due to change in treatment goals:  []  Decision to Escalate Care To:   []  Major Surgery/Procedure (any 1):   Elective with patient risk factors including Procedure Type and Risk Factors:   Emergent Procedure Type:    ----------------------------------------------------------------------  C. Data (any 2)  []  Data Review (3+ points)  []  Consultant Note reviewed with note date, specialty, and summary (1 point each)  [x]  All current labs were reviewed and interpreted for clinical significance   []  Studies Reviewed (1 point each):   []  Collateral history obtained including from who and why needed (Max 1 point):  []  Independent (Elva Mauro DELENA Creighton, MD) Interpretation of tests (any 1)  []  Rhythm Strip (Telemetry) personally reviewed and interpreted as documented above    []  Imaging personally reviewed and interpreted, includes:    [x]  Discussion (any 1)  [x]  Discussed the discharge plan in detail with case management including timing/barriers to discharge, need for support services and/or placement decision   []  Discussed management of the case with:         Review of Systems:      Pertinent positives and negatives discussed in HPI    Objective:     Intake/Output Summary (Last 24 hours) at 04/05/2024 0811  Last data filed at 04/05/2024 9361  Gross per 24 hour   Intake 540 ml   Output 25 ml   Net 515 ml        Vitals:   Vitals:    04/04/24 1821 04/04/24 1937 04/05/24 0112 04/05/24 0654   BP:  126/69 (!) 151/82 137/78   Pulse:  (!) 117 (!) 125 (!) 110   Resp: 16 16 16 16    Temp:  98.8 F (37.1 C) 100.4 F (38 C) 98.8 F (37.1 C)   TempSrc:  Oral Oral Oral   SpO2:  96% 94% 95%   Weight:       Height:             Physical Exam:      General:  NAD  Eyes: EOMI  ENT: neck supple  Cardiovascular: Regular rate.  Respiratory: Clear to auscultation  Gastrointestinal: Soft, non tender  Genitourinary: no suprapubic tenderness  Musculoskeletal: No edema  Skin: warm, dry  Neuro: Alert.  Psych: Mood appropriate.         Medications:   Medications:    insulin  glargine  40 Units SubCUTAneous  Nightly    insulin  lispro  10 Units SubCUTAneous TID WC    [Held by provider] valACYclovir   500 mg Oral Daily    cetirizine   10 mg Oral Daily    montelukast   10 mg Oral Daily    ferrous sulfate   325 mg Oral Daily with breakfast    sodium chloride  flush  5-40 mL IntraVENous 2 times per day    aspirin   81 mg Oral BID    sodium chloride  flush  5-40 mL IntraVENous 2 times per day    insulin  lispro  0-8 Units SubCUTAneous 4x Daily AC & HS    pantoprazole   40 mg Oral QAM AC    nafcillin   2,000 mg IntraVENous Q4H    levothyroxine   25 mcg Oral Daily      Infusions:    sodium chloride       sodium chloride       sodium chloride       sodium chloride       dextrose  5 % and 0.45 % NaCl Stopped (03/28/24 2303)    dextrose        PRN Meds: furosemide , 20 mg, Daily PRN  sodium chloride  flush, 5-40 mL, PRN  sodium chloride , , PRN  acetaminophen , 650 mg, Q4H PRN  oxyCODONE , 5 mg, Q4H PRN   Or  oxyCODONE , 10 mg, Q4H PRN  HYDROmorphone , 0.25 mg, Q3H PRN   Or  HYDROmorphone , 0.5 mg, Q3H PRN  bisacodyl , 5 mg, Daily PRN  ondansetron , 4 mg, Q8H PRN   Or  ondansetron , 4 mg, Q6H PRN  sodium chloride , , PRN  potassium chloride , 40 mEq, PRN   Or  potassium alternative oral replacement, 40 mEq, PRN   Or  potassium chloride , 10 mEq, PRN  sodium chloride  flush, 5-40 mL, PRN  sodium chloride , , PRN  sodium chloride , , PRN  dextrose  bolus, 125 mL, PRN   Or  dextrose  bolus, 250 mL, PRN  magnesium  sulfate, 2,000 mg, PRN  sodium phosphate  15 mmol in sodium chloride  0.9 % 250 mL IVPB, 15 mmol, PRN  magnesium  sulfate, 2,000 mg, PRN  polyethylene glycol, 17 g, Daily PRN  dextrose  5 % and 0.45 % NaCl, , Continuous  PRN  glucose, 4 tablet, PRN  glucagon , 1 mg, PRN  dextrose , , Continuous PRN  albuterol , 2.5 mg, Q6H PRN        Labs and Imaging   CT SOFT TISSUE NECK WO CONTRAST  Result Date: 03/28/2024  DATE: March 28, 2024 EXAM: CT SOFT TISSUE NECK WO CONTRAST INDICATION: neck pain, fever; COMPARISON: None. TECHNIQUE: Axial CT imaging obtained from the skull base through the lung apices. Axial images and multiplanar reformatted images reviewed.   Up-to-date CT equipment and radiation dose reduction techniques were employed. IV Contrast: None FINDINGS: SCOUT: Nondiagnostic scout image(s) obtained for localization purposes, not for diagnostic use DEVICES/HARDWARE: None . ORBITS, PARANASAL SINUSES, SKULL BASE: Limited images of the base of the brain demonstrate no mass effect. Orbits are normal. Imaged sinuses are clear. CERVICAL SPINE/BONES: No destructive process. NASAL CAVITY/NASOPHARYNX: No mass. ORAL CAVITY/OROPHARYNX: No evidence of mass. HYPOPHARYNX AND LARYNX: Normal. DEEP NECK SPACES: The parapharyngeal, retropharyngeal and masticator spaces are normal. SALIVARY GLANDS: Submandibular glands, sublingual glands and parotid glands are normal. THYROID GLAND: Normal. LYMPH NODES: Nonenlarged cervical lymph nodes bilaterally. THORACIC INLET: No mass or acute abnormality involving the visualized mediastinal structures, trachea or lung apices. OTHER: No significant abnormality.     1. Negative. This dictation was created with voice recognition software.  While attempts  have been made to review the dictation as it was transcribed, occasionally the spoken word can be misinterpreted  by the technology, leading to omissions or inappropriate words or phrases.  If questions or concerns arise, please contact the Radiology Department. Electronically signed by Elouise Hugh, MD    CT ABDOMEN PELVIS WO CONTRAST Additional Contrast? None  Result Date: 03/28/2024  DATE: 03/28/2024 EXAM: CT ABDOMEN PELVIS WO CONTRAST INDICATION: hip pain  Pain COMPARISON: 2015 TECHNIQUE: Axial CT imaging obtained from lung bases through pelvis. Axial images and multiplanar reformatted images are provided for review.  Up-to-date CT equipment and radiation dose reduction techniques were employed. IV Contrast: None Oral Contrast: No. FINDINGS: SCOUT: Nondiagnostic scout image(s) obtained for localization purposes, not for diagnostic use DEVICES/HARDWARE: None . LUNG BASES: Clear. LIVER: There is decreased in attenuation. Liver is up to 25 cm in craniocaudal dimension. GALLBLADDER AND BILIARY TREE: No calcified gallstones. No gallbladder distention.  No intra- or extrahepatic biliary dilatation. PANCREAS: Normal. SPLEEN: Spleen is up to 14 cm in craniocaudal dimension. ADRENAL GLANDS: Normal. KIDNEYS AND URETERS: No hydronephrosis. Focal areas of striated enhancement of the kidneys bilaterally. Mild perinephric haziness bilaterally without fluid collection. BOWEL: Normal diameter, nonobstructed.  Appendix is not dilated or thickened. Distal colonic diverticulosis. URINARY BLADDER: Normal. REPRODUCTIVE ORGANS: No associated masses. LYMPH NODES: No abnormally enlarged nodes. PERITONEUM/RETROPERITONEUM: No ascites or free air. VESSELS: Aorta and IVC without significant abnormality.  ABDOMINAL WALL: Mild periarticular edema or swelling of the left hip. BONES: No significant abnormality. OTHER FINDINGS: None.     1. Diverticulosis of the colon without evidence of diverticulitis. 2. Focal areas of striated enhancement of the kidneys, previous CT contrast administration. Findings could indicate renal dysfunction or infection. 3. Mild periarticular edema or swelling of the left hip suggesting muscular injury/strain. 4. Hepatosplenomegaly. This dictation was created with voice recognition software.  While attempts have been made to review the dictation as it was transcribed, occasionally the spoken word can be misinterpreted  by the technology, leading to omissions or  inappropriate words or phrases.  If questions or concerns arise, please contact the Radiology Department. Electronically signed by Elouise Hugh, MD    CT HEAD WO CONTRAST  Result Date: 03/28/2024  EXAM: CT HEAD WO CONTRAST STUDY DATE: 03/28/2024 11:15 EST INDICATION: neck pain COMPARISON: None TECHNIQUE: Standard per department protocol. Up-to-date CT equipment and radiation dose reduction techniques were employed. CONTRAST: No intravenous FINDINGS: Brain Parenchyma: No midline shift, mass effect, parenchymal hemorrhage, or evidence of acute territorial infarct. Ventricular System and Extra-Axial Spaces: No extra-axial fluid collections. Basilar cisterns are patent. No hydrocephalus. Extracranial Structures: No significant paranasal sinus disease. No calvarial abnormality. No orbit abnormality.     *  No acute intracranial findings. Electronically signed by Isom Repress    CT CHEST PULMONARY EMBOLISM W CONTRAST  Result Date: 03/27/2024  DATE: 03/27/2024 EXAM: CT CHEST PULMONARY EMBOLISM W CONTRAST INDICATION: c/f PE, Chest pain COMPARISON: None. TECHNIQUE: Axial CT imaging obtained through the chest using CT pulmonary angiogram protocol. Axial images, multiplanar reformatted images and maximum intensity projection images were reviewed.  Up-to-date CT equipment and radiation dose reduction techniques were employed.  IV Contrast Media and volume: 80 cc iodinated contrast FINDINGS: SCOUT: Nondiagnostic scout image(s) obtained for localization purposes, not for diagnostic use DEVICES/HARDWARE: None . DIAGNOSTIC QUALITY: Suboptimal. PULMONARY EMBOLI: None. RIGHT HEART STRAIN: None. LUNGS AND AIRWAYS: Airways are patent.  Lungs are clear. PLEURA: No pleural effusions or significant pleural thickening. HEART / GREAT VESSELS: Heart is not  enlarged. ADENOPATHY: None. CHEST WALL / LOWER NECK: No significant abnormality. UPPER ABDOMEN: Normal. BONES: No significant abnormality. OTHER FINDINGS: None.     1. No evidence of  pulmonary embolus. This dictation was created with voice recognition software.  While attempts have been made to review the dictation as it was transcribed, occasionally the spoken word can be misinterpreted  by the technology, leading to omissions or inappropriate words or phrases.  If questions or concerns arise, please contact the Radiology Department. Electronically signed by Elouise Hugh, MD    XR CHEST PORTABLE  Result Date: 03/27/2024  Chest HISTORY : Dyspnea VIEWS : 1     Clear lungs. Normal cardiac mediastinal silhouette. Electronically signed by Lynwood Schwalbe, MD      CBC:   Recent Labs     04/03/24  0424 04/04/24  0505 04/05/24  0622   WBC 15.9* 12.4* 12.5*   HGB 9.2* 9.0* 8.8*   PLT 610* 683* 622*     BMP:    No results for input(s): NA, K, CL, CO2, BUN, CREATININE, GLUCOSE in the last 72 hours.    Hepatic: No results for input(s): AST, ALT, BILITOT, ALKPHOS in the last 72 hours.    Invalid input(s): ALB  Lipids:   Lab Results   Component Value Date/Time    CHOL 154 11/25/2023 02:44 PM    HDL 22 11/25/2023 02:44 PM    TRIG 170 11/25/2023 02:44 PM     Hemoglobin A1C:   Lab Results   Component Value Date/Time    LABA1C 13.9 03/27/2024 03:47 AM     TSH:   Lab Results   Component Value Date/Time    TSH 1.92 08/27/2022 04:35 PM     Troponin: No results found for: TROPONINT  Lactic Acid:   No results for input(s): LACTA in the last 72 hours.    BNP: No results for input(s): PROBNP in the last 72 hours.  UA:  Lab Results   Component Value Date/Time    NITRU Negative 03/28/2024 02:03 AM    COLORU Yellow 03/28/2024 02:03 AM    PHUR 6.0 03/28/2024 02:03 AM    PHUR 5.0 03/28/2024 02:03 AM    PHUR 5.5 02/25/2017 12:22 PM    WBCUA 3-5 03/28/2024 02:03 AM    RBCUA 5-10 03/28/2024 02:03 AM    BACTERIA 3+ 03/28/2024 02:03 AM    CLARITYU Clear 03/28/2024 02:03 AM    LEUKOCYTESUR TRACE 03/28/2024 02:03 AM    UROBILINOGEN 0.2 03/28/2024 02:03 AM    BILIRUBINUR Negative 03/28/2024 02:03 AM     BLOODU SMALL 03/28/2024 02:03 AM    GLUCOSEU Negative 03/28/2024 02:03 AM    KETUA Negative 03/28/2024 02:03 AM     Urine Cultures:   Lab Results   Component Value Date/Time    LABURIN 25,000 CFU/ml  PBP2= Negative   03/28/2024 01:54 AM     Blood Cultures:   Lab Results   Component Value Date/Time    [facility]  03/29/2024 02:48 PM     POSITIVE  Isolated one of two sets  No further workup  Refer to culture #[id number] for sensitivity results       Lab Results   Component Value Date/Time    BLOODCULT2 No Growth after 4 days of incubation. 03/30/2024 04:21 PM     Organism:   Lab Results   Component Value Date/Time    ORG Staphylococcus aureus 04/02/2024 02:09 PM         Electronically signed by Marne LABOR  Arne, MD on 04/05/2024 at 8:11 AM  Comment: Please note this report has been produced using speech recognition software and may contain errors related to that system including errors in grammar, punctuation, and spelling, as well as words and phrases that may be inappropriate. If there are any questions or concerns, please feel free to contact the dictating provider for clarification.   "

## 2024-04-05 NOTE — Care Coordination (Signed)
"  CM following for discharge planning.  Patient will need IV abx at discharge, plans for [facility] outpatient infusion center.  Per email, will be approved for charity through end of the year.  Patient should have insurance coverage on 04/15/2024.      Nat Dais, RN, BSN, Case Manager  5T Ortho/Neuro   [phone]    "

## 2024-04-06 ENCOUNTER — Encounter

## 2024-04-06 LAB — CULTURE, ANAEROBIC AND AEROBIC

## 2024-04-06 LAB — POCT GLUCOSE
POC Glucose: 240 mg/dL — ABNORMAL HIGH (ref 70–99)
POC Glucose: 206 mg/dL — ABNORMAL HIGH (ref 70–99)
POC Glucose: 178 mg/dL — ABNORMAL HIGH (ref 70–99)
POC Glucose: 144 mg/dL — ABNORMAL HIGH (ref 70–99)

## 2024-04-06 LAB — CBC WITH AUTO DIFFERENTIAL
Basophils %: 0.3 %
Basophils Absolute: 0 K/uL (ref 0.0–0.2)
Eosinophils %: 1 %
Eosinophils Absolute: 0.1 K/uL (ref 0.0–0.6)
Hematocrit: 27.2 % — ABNORMAL LOW (ref 36.0–48.0)
Hemoglobin: 8.8 g/dL — ABNORMAL LOW (ref 12.0–16.0)
Lymphocytes %: 12.3 %
Lymphocytes Absolute: 1.4 K/uL (ref 1.0–5.1)
MCH: 26.4 pg (ref 26.0–34.0)
MCHC: 32.3 g/dL (ref 31.0–36.0)
MCV: 81.6 fL (ref 80.0–100.0)
MPV: 7.7 fL (ref 5.0–10.5)
Monocytes %: 4.4 %
Monocytes Absolute: 0.5 K/uL (ref 0.0–1.3)
Neutrophils %: 82 %
Neutrophils Absolute: 9.6 K/uL — ABNORMAL HIGH (ref 1.7–7.7)
Platelets: 637 K/uL — ABNORMAL HIGH (ref 135–450)
RBC: 3.33 M/uL — ABNORMAL LOW (ref 4.00–5.20)
RDW: 16.8 % — ABNORMAL HIGH (ref 12.4–15.4)
WBC: 11.7 K/uL — ABNORMAL HIGH (ref 4.0–11.0)

## 2024-04-06 MED FILL — PANTOPRAZOLE SODIUM 40 MG PO TBEC: 40 mg | ORAL | Qty: 1 | Fill #0

## 2024-04-06 MED FILL — ASPIRIN LOW DOSE 81 MG PO TBEC: 81 mg | ORAL | Qty: 1 | Fill #0

## 2024-04-06 MED FILL — NAFCILLIN SODIUM 2 G IJ SOLR: 2 g | INTRAMUSCULAR | Qty: 2000 | Fill #0

## 2024-04-06 MED FILL — ACETAMINOPHEN 325 MG PO TABS: 325 mg | ORAL | Qty: 2 | Fill #0

## 2024-04-06 MED FILL — INSULIN LISPRO 100 UNIT/ML IJ SOLN: 100 [IU]/mL | INTRAMUSCULAR | Qty: 2 | Fill #0

## 2024-04-06 MED FILL — OXYCODONE HCL 5 MG PO TABS: 5 mg | ORAL | Qty: 2 | Fill #0

## 2024-04-06 MED FILL — FERROUS SULFATE 325 (65 FE) MG PO TABS: 325 (65 Fe) MG | ORAL | Qty: 1 | Fill #0

## 2024-04-06 MED FILL — INSULIN LISPRO 100 UNIT/ML IJ SOLN: 100 [IU]/mL | INTRAMUSCULAR | Qty: 10 | Fill #0

## 2024-04-06 MED FILL — LANTUS 100 UNIT/ML SC SOLN: 100 [IU]/mL | SUBCUTANEOUS | Qty: 40 | Fill #0

## 2024-04-06 MED FILL — LEVOTHYROXINE SODIUM 25 MCG PO TABS: 25 ug | ORAL | Qty: 1 | Fill #0

## 2024-04-06 MED FILL — CETIRIZINE HCL 10 MG PO TABS: 10 mg | ORAL | Qty: 1 | Fill #0

## 2024-04-06 MED FILL — MONTELUKAST SODIUM 10 MG PO TABS: 10 mg | ORAL | Qty: 1 | Fill #0

## 2024-04-06 NOTE — Progress Notes (Signed)
"  Patient is A&Ox4. VSS on RA. Patient has endorsed pain to left hip managed  per [facility] and non-pharm measures. Patient is tolerating PO diet. Tolerating ambulation x 1 assist with GB/walker. Voiding via BRP.CHG bath and line care performed. Pt denies any needs at this time.Patient updated on plan of care. Fall and safety precautions in place, call light within reach. Continuing care.  "

## 2024-04-06 NOTE — Progress Notes (Signed)
 "ID Follow-up NOTE    CC:   DKA, left hip pain  Antibiotics: Nafcillin     Admit Date: 03/26/2024  Hospital Day: 12    Subjective:     Patient is status post left hip bursa washout on 12/19. She remains afebrile. Reports that her hip pain is improving.      Objective:     Patient Vitals for the past 8 hrs:   BP Temp Temp src Pulse Resp SpO2   04/06/24 0817 (!) 141/85 98.8 F (37.1 C) -- (!) 108 -- 94 %   04/06/24 0348 134/77 99.1 F (37.3 C) Oral (!) 113 16 93 %     I/O last 3 completed shifts:  In: 820 [P.O.:820]  Out: -   No intake/output data recorded.    EXAM:  GENERAL: No apparent distress.    HEENT: Membranes moist, no oral lesion  NECK:  Supple, no lymphadenopathy  LUNGS: Clear b/l, no rales, no dullness  CARDIAC: RRR  ABD:  + BS, soft   EXT:  No rash, slight left hip pain.   NEURO: No focal neurologic findings  PSYCH: Orientation, sensorium, mood normal  LINES:  Peripheral iv       Data Review:  Lab Results   Component Value Date    WBC 11.7 (H) 04/06/2024    HGB 8.8 (L) 04/06/2024    HCT 27.2 (L) 04/06/2024    MCV 81.6 04/06/2024    PLT 637 (H) 04/06/2024     Lab Results   Component Value Date    CREATININE 0.5 (L) 04/02/2024    BUN 4 (L) 04/02/2024    NA 137 04/02/2024    K 3.2 (L) 04/02/2024    CL 98 (L) 04/02/2024    CO2 28 04/02/2024       Hepatic Function Panel:   Lab Results   Component Value Date/Time    ALKPHOS 154 11/25/2023 02:44 PM    ALT 16 11/25/2023 02:44 PM    AST 15 11/25/2023 02:44 PM    BILITOT 0.7 11/25/2023 02:44 PM    BILIDIR <0.2 02/03/2014 06:04 PM    IBILI see below 02/03/2014 06:04 PM       MICRO:  Blood cultures x 212/21: No growth to date    Or culture left hip bursa 12/19: Rare growth Staph aureus, MSSA    Blood cultures x 2 12/16: No growth    Left hip bursa aspirate culture 12/18: Moderate growth MSSA    Blood cultures x 2 12/15: Positive for MSSA    Blood cultures x 2 12/13: Positive for MSSA  Staphylococcus aureus (2)    Antibiotic Interpretation MIC  Method Status     clindamycin  Sensitive <=0.25 mcg/mL BACTERIAL SUSCEPTIBILITY PANEL BY MIC     erythromycin  Sensitive <=0.25 mcg/mL BACTERIAL SUSCEPTIBILITY PANEL BY MIC     levofloxacin Resistant 4 mcg/mL BACTERIAL SUSCEPTIBILITY PANEL BY MIC     linezolid Sensitive 2 mcg/mL BACTERIAL SUSCEPTIBILITY PANEL BY MIC     oxacillin Sensitive 0.5 mcg/mL BACTERIAL SUSCEPTIBILITY PANEL BY MIC     tetracycline Sensitive <=1 mcg/mL BACTERIAL SUSCEPTIBILITY PANEL BY MIC      Tetracyclines have decreased renal elimination and  urinary bladder concentrations.  Use of this class of  agents for urinary tract infection should be limited to  patients that cannot tolerate an alternative therapy.       trimethoprim-sulfamethoxazole Sensitive <=10 mcg/mL BACTERIAL SUSCEPTIBILITY PANEL BY MIC     vancomycin  Sensitive 1 mcg/mL BACTERIAL SUSCEPTIBILITY PANEL  BY MIC       Urine culture 12/14: 25,000 CFU MSSA      IMAGING: I have independently reviewed the images and reports.       MRI left hip with and without contrast 12/17:  1. There is a 4.6 cm left greater trochanter bursal fluid collection with thin tract which extends superiorly into the left gluteus medius muscle. There is extensive surrounding edema as well as edema within the left gluteus medius muscle, and lateral   aspect of the adjacent left gluteus maximus muscle. In the setting of infection this is most worrisome for a septic bursitis-abscess.  2. No evidence of any osteomyelitis.  3. No acute osseous abnormality.    X-ray left hip 12/16:  1. No acute fracture or dislocation.  2. Mild degenerative changes of the hips.    CT abdomen pelvis without contrast 12/14:  1. Diverticulosis of the colon without evidence of diverticulitis.  2. Focal areas of striated enhancement of the kidneys, previous CT contrast administration. Findings could indicate renal dysfunction or infection.  3. Mild periarticular edema or swelling of the left hip suggesting muscular injury/strain.  4.  Hepatosplenomegaly.    CT chest PE 12/13:  No evidence of pulmonary embolus.     Scheduled Meds:   insulin  glargine  40 Units SubCUTAneous Nightly    insulin  lispro  10 Units SubCUTAneous TID WC    [Held by provider] valACYclovir   500 mg Oral Daily    cetirizine   10 mg Oral Daily    montelukast   10 mg Oral Daily    ferrous sulfate   325 mg Oral Daily with breakfast    sodium chloride  flush  5-40 mL IntraVENous 2 times per day    aspirin   81 mg Oral BID    sodium chloride  flush  5-40 mL IntraVENous 2 times per day    insulin  lispro  0-8 Units SubCUTAneous 4x Daily AC & HS    pantoprazole   40 mg Oral QAM AC    nafcillin   2,000 mg IntraVENous Q4H    levothyroxine   25 mcg Oral Daily       Continuous Infusions:   sodium chloride       sodium chloride       sodium chloride       sodium chloride       dextrose  5 % and 0.45 % NaCl Stopped (03/28/24 2303)    dextrose          PRN Meds:  furosemide , sodium chloride  flush, sodium chloride , acetaminophen , oxyCODONE  **OR** oxyCODONE , HYDROmorphone  **OR** HYDROmorphone , bisacodyl , ondansetron  **OR** ondansetron , sodium chloride , potassium chloride  **OR** potassium alternative oral replacement **OR** potassium chloride , sodium chloride  flush, sodium chloride , sodium chloride , dextrose  bolus **OR** dextrose  bolus, magnesium  sulfate, sodium phosphate  15 mmol in sodium chloride  0.9 % 250 mL IVPB, magnesium  sulfate, polyethylene glycol, dextrose  5 % and 0.45 % NaCl, glucose, glucagon , dextrose , albuterol       Assessment:       Patient Active Problem List   Diagnosis    Mixed hyperlipidemia    Iron  deficiency anemia    Type 2 diabetes mellitus with hyperglycemia, with long-term current use of insulin  (HCC)    Essential hypertension    Acquired hypothyroidism    Mild intermittent asthma without complication    Allergic rhinitis    Heart murmur    Uterine fibroid    Class 1 obesity due to excess calories with serious comorbidity and body mass index (BMI) of 31.0 to 31.9 in adult    Abnormal  uterine bleeding    Eczema    Glaucoma suspect, bilateral    Hidradenitis axillaris    HSV-2 infection    Myopic astigmatism, bilateral    Strabismus    S/P myomectomy    DKA, type 2, not at goal [facility])    Acute nonintractable headache    MSSA bacteremia    Diabetic ketoacidosis without coma associated with type 2 diabetes mellitus (HCC)    Left hip pain    Abscess of bursa of left hip        Plan:   38 year old African-American female with history of DM2, HTN, HLD that presented on 12/13 with headache and shortness of breath.  Patient has recently lost her health insurance since 12/2023 and was not taking any of her medications.  She was found to be in DKA and was admitted to the ICU.      MSSA bacteremia and left hip greater trochanteric bursitis:  - upon presentation, she was also noted to be tachycardic, hypertensive and had leukocytosis.    - Infectious workup with blood cultures 2 of 2 sets obtained on admission positive for MSSA.   - Patient was initially started on vancomycin  which has now been transitioned to nafcillin .    - In speaking to the patient and is unclear what the source of MSSA bacteremia.  She denies any recent skin or soft tissue infections, no skin tears, she has several tattoos noted which are not new.    - She adamantly denies any form of drug use.  UDS on admission was negative.    - Of note urine culture also showing 25,000 CFU of MSSA which is likely overflow effect from hematogenous infection.    - Her main complaint at this time is left hip pain and this is worsened with weightbearing.    - we rec MRI which showed 4.6cm left greater trochanteric bursal fluid collection.  This was aspirated with IR and yielded growth of MSSA.    - orthopedics evaluated and she was taken for operative washout of the bursa on 12/19 with operative cultures showing growth of the same.  -We will continue nafcillin .  Repeated sets of blood cultures have remained negative.  TTE without any evidence of IE  -  Given patient's lack of insurance, she will not qualify for home health and IV antibiotics at home and will likely need to come in to [facility] via charity care for IV antibiotics.   - Given this, we will plan to change to daptomycin  when ready for discharge due to its once a day administration.   - s/p test dose dapto on 12/18 and COC in place to start the process of possible charity care.   - PICC line placed 12/22. Ok to use.  - Patient had a fever on 12/22. If recurs, consider resending blood cultures    DM2:  - Good glycemic control to aid in healing and prevention of further infections.  - A1c on this admission was 13.9      INFUSION ORDERS:  - Drug: IV daptomycin  400 mg daily  - Planned End date: 04/30/2024  - Diagnosis: MSSA bacteremia, left hip septic bursitis   - Has received test dose in hospital  - Routine line maintenance  - Check CBC w diff, CMP, ESR, CRP, CK  every Mon or Tue - FAX result to [phone]  - Call with antibiotic / infusion issues, [phone]  - Call with any change in status, transfer in or out of  a facility or to hospital; This COC is only valid for current disposition - [phone].    - No f/u in outpatient ID office necessary       Medical Decision Making:  The following items were considered in medical decision making:  Discussion of patient care with other providers  Reviewed clinical lab tests  Reviewed radiology tests  Reviewed other diagnostic tests/interventions  Independent review of radiologic images  Microbiology cultures and other micro tests reviewed      Please note that this chart was generated using Dragon dictation software. Although every effort was made to ensure the accuracy of this automated transcription, some errors in transcription may have occurred inadvertently.  Any pictures or media included in this note were obtained after taking informed verbal consent from the patient and with their approval to include those in the patient's medical record.     Discussed with Dr.  He.    Dorn Ovens, MD  Internal Medicine, PGY-3  "

## 2024-04-06 NOTE — Progress Notes (Signed)
"  Pt aox4 and able to make needs known. VSS and no acute events. PICC in place and care provided per protocol. Pain managed w/ MAR. All fall precautions in place. Call light and personal items kept w/in reach.  "

## 2024-04-06 NOTE — Plan of Care (Signed)
"    Problem: Chronic Conditions and Co-morbidities  Goal: Patient's chronic conditions and co-morbidity symptoms are monitored and maintained or improved  04/06/2024 0839 by Beckey Senior, RN  Outcome: Progressing  04/05/2024 2109 by Reatha Hole, RN  Outcome: Progressing     Problem: Discharge Planning  Goal: Discharge to home or other facility with appropriate resources  04/06/2024 0839 by Beckey Senior, RN  Outcome: Progressing  04/05/2024 2109 by Reatha Hole, RN  Outcome: Progressing     Problem: Safety - Adult  Goal: Free from fall injury  04/06/2024 0839 by Beckey Senior, RN  Outcome: Progressing  04/05/2024 2109 by Reatha Hole, RN  Outcome: Progressing     Problem: Pain  Goal: Verbalizes/displays adequate comfort level or baseline comfort level  04/06/2024 0839 by Beckey Senior, RN  Outcome: Progressing  Flowsheets (Taken 04/06/2024 [phone])  Verbalizes/displays adequate comfort level or baseline comfort level:   Encourage patient to monitor pain and request assistance   Assess pain using appropriate pain scale   Administer analgesics based on type and severity of pain and evaluate response   Implement non-pharmacological measures as appropriate and evaluate response   Notify Licensed Independent Practitioner if interventions unsuccessful or patient reports new pain   Consider cultural and social influences on pain and pain management  04/05/2024 2109 by Reatha Hole, RN  Outcome: Progressing     Problem: Nutrition Deficit:  Goal: Optimize nutritional status  04/06/2024 0839 by Beckey Senior, RN  Outcome: Progressing  04/05/2024 2109 by Reatha Hole, RN  Outcome: Progressing     Problem: ABCDS Injury Assessment  Goal: Absence of physical injury  04/06/2024 0839 by Beckey Senior, RN  Outcome: Progressing  Flowsheets (Taken 04/06/2024 [phone])  Absence of Physical Injury: Implement safety measures based on patient assessment  04/05/2024 2109 by Reatha Hole, RN  Outcome: Progressing  Flowsheets (Taken  04/05/2024 2100)  Absence of Physical Injury: Implement safety measures based on patient assessment     Problem: Neurosensory - Adult  Goal: Achieves stable or improved neurological status  04/06/2024 0839 by Beckey Senior, RN  Outcome: Progressing  04/05/2024 2109 by Reatha Hole, RN  Outcome: Progressing  Goal: Achieves maximal functionality and self care  04/06/2024 0839 by Beckey Senior, RN  Outcome: Progressing  04/05/2024 2109 by Reatha Hole, RN  Outcome: Progressing     Problem: Cardiovascular - Adult  Goal: Maintains optimal cardiac output and hemodynamic stability  04/06/2024 0839 by Beckey Senior, RN  Outcome: Progressing  04/05/2024 2109 by Curry, Dionnie, RN  Outcome: Progressing  Goal: Absence of cardiac dysrhythmias or at baseline  04/06/2024 0839 by Beckey Senior, RN  Outcome: Progressing  04/05/2024 2109 by Curry, Dionnie, RN  Outcome: Progressing     "

## 2024-04-06 NOTE — Care Coordination (Signed)
"  CM spoke with infusion center.  They have not received any confirmation of the IV abx approval.  CM spoke with Dr He, who feels pt should be ready to discharge home if we can get her set up with the outpatient infusion center.      Nat Dais, RN, BSN, Case Manager  [facility]T [facility]   [phone]    "

## 2024-04-06 NOTE — Plan of Care (Signed)
"    Problem: Chronic Conditions and Co-morbidities  Goal: Patient's chronic conditions and co-morbidity symptoms are monitored and maintained or improved  04/06/2024 2015 by Ajeet Casasola, RN  Outcome: Progressing     Problem: Discharge Planning  Goal: Discharge to home or other facility with appropriate resources  04/06/2024 2015 by Reatha Hole, RN  Outcome: Progressing     Problem: Safety - Adult  Goal: Free from fall injury  04/06/2024 2015 by Kalany Diekmann, RN  Outcome: Progressing     Problem: Pain  Goal: Verbalizes/displays adequate comfort level or baseline comfort level  04/06/2024 2015 by Reatha Hole, RN  Outcome: Progressing     Problem: Nutrition Deficit:  Goal: Optimize nutritional status  04/06/2024 2015 by Brevyn Ring, RN  Outcome: Progressing     Problem: ABCDS Injury Assessment  Goal: Absence of physical injury  04/06/2024 2015 by Gaylin Bulthuis, RN  Outcome: Progressing  Flowsheets (Taken 04/06/2024 2013)  Absence of Physical Injury: Implement safety measures based on patient assessment     Problem: Neurosensory - Adult  Goal: Achieves stable or improved neurological status  04/06/2024 2015 by Reatha Hole, RN  Outcome: Progressing     Problem: Neurosensory - Adult  Goal: Achieves maximal functionality and self care  04/06/2024 2015 by Reatha Hole, RN  Outcome: Progressing     Problem: Skin/Tissue Integrity - Adult  Goal: Skin integrity remains intact  04/06/2024 2015 by Reatha Hole, RN  Outcome: Progressing     Problem: Musculoskeletal - Adult  Goal: Return mobility to safest level of function  04/06/2024 2015 by Sherron Mapp, RN  Outcome: Progressing     Problem: Musculoskeletal - Adult  Goal: Maintain proper alignment of affected body part  04/06/2024 2015 by Lilliahna Schubring, RN  Outcome: Progressing     Problem: Musculoskeletal - Adult  Goal: Return ADL status to a safe level of function  04/06/2024 2015 by Tivon Lemoine, RN  Outcome: Progressing     "

## 2024-04-06 NOTE — Progress Notes (Signed)
"    Progress Note    Subjective:       Systemic or Specific Complaints: reports moderate soreness to left lateral hip with ambulation    Objective:     Patient Vitals for the past 24 hrs:   BP Temp Temp src Pulse Resp SpO2   04/06/24 1154 137/74 98.8 F (37.1 C) -- (!) 117 -- 95 %   04/06/24 0849 -- -- -- -- 16 --   04/06/24 0817 (!) 141/85 98.8 F (37.1 C) Oral (!) 108 16 94 %   04/06/24 0348 134/77 99.1 F (37.3 C) Oral (!) 113 16 93 %   04/05/24 2339 (!) 147/86 99 F (37.2 C) Oral (!) 116 16 96 %   04/05/24 2132 134/83 99.7 F (37.6 C) Oral (!) 111 16 98 %   04/05/24 1750 -- -- -- -- 16 --   04/05/24 1650 -- -- -- -- 16 --   04/05/24 1630 (!) 146/73 (!) 101.5 F (38.6 C) Oral (!) 117 16 97 %       General: alert, appears stated age, and cooperative   Wound: Wound Intact, dressing clean   Motion: Painful range of Motion in affected extremity but NVI   DVT Exam: No evidence of DVT seen on physical exam.     Additional exam:      Data Review  CBC:   Lab Results   Component Value Date/Time    WBC 11.7 04/06/2024 06:11 AM    RBC 3.33 04/06/2024 06:11 AM    HGB 8.8 04/06/2024 06:11 AM    HCT 27.2 04/06/2024 06:11 AM    PLT 637 04/06/2024 06:11 AM       Renal:   Lab Results   Component Value Date/Time    NA 137 04/02/2024 06:56 AM    K 3.2 04/02/2024 06:56 AM    CL 98 04/02/2024 06:56 AM    CO2 28 04/02/2024 06:56 AM    BUN 4 04/02/2024 06:56 AM    CREATININE 0.5 04/02/2024 06:56 AM    GLUCOSE 105 04/02/2024 06:56 AM    CALCIUM  8.2 04/02/2024 06:56 AM            Assessment:      left hip stable POD #4 s/p I&D left greater trochanter bursa     Plan:      1:  Continue abx as per ID recs  2:  May change dressing daily but allow wound to be exposed during showering/bathing  3:  Follow up in our [facility] office in approximately 1-2 weeks    LAMAR GRETTA BIDDING, MD  [facility]  [phone] (cell)  [phone] (appointment scheduling)  "

## 2024-04-06 NOTE — Progress Notes (Signed)
 "    V2.0    [facility] Progress Note      Name:  Shelly Richard DOB/Age/Sex: 07-11-1985  (38 y.o. female)   MRN & CSN:  [id number] & [id number] Encounter Date/Time: 04/06/2024 12:21 PM EST   Location:  5514/5514-01 PCP: Alexandro Bernice FORBES, [name]     Attending:Elajah Kunsman, Jeralyn, MD       Hospital Day: 12  Subjective:     Chief Complaint: Sepsis    Afebrile since 04/05/2024 4pm  Still has some tachycardia  No sign of worsening infection. No new pain, no n/v/d. No cough or SOB  Discussed plan w ID.  Repeat blood cx 04/04/2024 negative so far.        Assessment and Recommendations   HPI per admitting provider :      Hospital course:    Shelly Richard is a 38 y.o. female with pmh of diabetes, hypertension, hyperlipidemia who presents with DKA, type 2, not at goal [facility]) with sepsis.  Patient has not been taking her medications due to insurance issues.  DKA was treated with an insulin  drip and now switched to subcutaneous insulin .  Workup revealed MSSA bacteremia.  Patient CT scan showed some fluid around the hip MRI confirmed possible trochanteric bursal infection.  Underwent aspiration on 12/18, culture grew staph.  Underwent I&D of abscess on 12/19      Plan:     DKA resolved  Uncontrolled type 2 diabetes  will need charity meds on discharge  A1c 13.9   Glucose now controlled  - On Lantus  40U QHS, Lispro 10U TID w meals + MDSSI       Sepsis present on admission 22  MSSA bacteremia  patient will need charity IV antibiotics social worker aware  - Was initially on vancomycin  switched to nafcillin  on 12/15  - Repeat blood culture pending positive on 12/15, repeat blood culture on 12/16 negative, 04/04/2024 negative  - TEE normal EF no vegetation  - was having fever episodes last one 04/05/2024 4pm.   - per ID, discharge plan daptomycin  400 mg daily last day 04/30/2024    Acute on chronic anemia    Hemoglobin dropped from 10.9 to 5.7   Iron  studies shows iron  deficiency but with bacteremia will hold off IV iron   In the past  patient needed iron  transfusions  In the past patient had a lot of menstrual bleeding and was on Femara for fibroid  Hgb now stable  No sign of bleeding    Hypothyroidism  Resume home  Levothyroid    Hip pain-with septic bursitis-monitor drain output, culture positive for staph  -s/p I&D on 12/19 culture positive for staph  - CT shows some fluid collection  - MRI done on 12/17 shows a 4.6 cm left greater trochanteric bursal fluid collection which tracks into the gluteus muscle with surrounding edema concern for septic bursitis/abscess  - ortho following; pt to follow up outpatient.      Diet ADULT DIET; Regular; 3 carb choices (45 gm/meal)   DVT Prophylaxis []  Lovenox , []   Heparin, []  SCDs, []  Ambulation,  []  Eliquis, []  Xarelto  []  Coumadin   Code Status Full Code   Disposition Abx arrangement, monitor for fever     Surrogate Decision Maker/ POA Spouse       MDM  [x]  High (any 2 of A, B, or C)    A. Problems (any 1)  [x]  Acute/Chronic Illness/injury posing threat to life or bodily function:    []   Severe exacerbation of chronic illness:    ---------------------------------------------------------------------  B. Risk of Treatment (any 1)   []  IV ABX requiring serial renal monitoring for nephrotoxicity:     [x]  IV Narcotic analgesia for adverse drug reaction  []  IV diuresis requiring serial monitoring for renal impairment and electrolyte derangements  []  Critical electrolyte abnormalities requiring IV replacement and close serial monitoring  [x]  Insulin  - monitoring serial FSBS for Hypoglycemic adverse drug reaction  []  Anticoagulation requiring serial monitoring of coagulation factors  []  IV/IM Controlled Substances order (any 1)   []  One-time Order including Drug Name/Route, Reason Ordered:   []  Scheduled Order including Drug Name/Route, Reason Ordered or Continued:   []  PRN Order including Drug Name/Route, Reason Ordered or Continued:  [] Other -   []  Decision to De-escalate Care this DOS due to change in treatment  goals:  []  Decision to Escalate Care To:   []  Major Surgery/Procedure (any 1):   Elective with patient risk factors including Procedure Type and Risk Factors:   Emergent Procedure Type:    ----------------------------------------------------------------------  C. Data (any 2)  []  Data Review (3+ points)  []  Consultant Note reviewed with note date, specialty, and summary (1 point each)  [x]  All current labs were reviewed and interpreted for clinical significance   []  Studies Reviewed (1 point each):   []  Collateral history obtained including from who and why needed (Max 1 point):  []  Independent Vivianne Lory, MD) Interpretation of tests (any 1)  []  Rhythm Strip (Telemetry) personally reviewed and interpreted as documented above    []  Imaging personally reviewed and interpreted, includes:    [x]  Discussion (any 1)  []  Discussed the discharge plan in detail with case management including timing/barriers to discharge, need for support services and/or placement decision   [x]  Discussed management of the case with ID        Review of Systems:      Pertinent positives and negatives discussed in HPI    Objective:     Intake/Output Summary (Last 24 hours) at 04/06/2024 2224  Last data filed at 04/06/2024 1452  Gross per 24 hour   Intake 480 ml   Output --   Net 480 ml        Vitals:   Vitals:    04/06/24 0849 04/06/24 1154 04/06/24 1703 04/06/24 2202   BP:  137/74 (!) 146/86 (!) 145/85   Pulse:  (!) 117 (!) 121 (!) 119   Resp: 16 16 16 16    Temp:  98.8 F (37.1 C) 99.3 F (37.4 C) 100.3 F (37.9 C)   TempSrc:  Oral Oral Oral   SpO2:  95% 95% 97%   Weight:       Height:             Physical Exam:      General: NAD  Eyes: EOMI  ENT: neck supple  Cardiovascular: Regular rate.  Respiratory: Clear to auscultation  Gastrointestinal: Soft, non tender  Genitourinary: no suprapubic tenderness  Musculoskeletal: No edema  Skin: warm, dry  Neuro: Alert.  Psych: Mood appropriate.         Medications:   Medications:    [START ON  04/07/2024] enoxaparin   40 mg SubCUTAneous Daily    insulin  glargine  40 Units SubCUTAneous Nightly    insulin  lispro  10 Units SubCUTAneous TID WC    [Held by provider] valACYclovir   500 mg Oral Daily    cetirizine   10 mg Oral Daily    montelukast   10 mg Oral Daily    ferrous sulfate   325 mg Oral Daily with breakfast    sodium chloride  flush  5-40 mL IntraVENous 2 times per day    aspirin   81 mg Oral BID    sodium chloride  flush  5-40 mL IntraVENous 2 times per day    insulin  lispro  0-8 Units SubCUTAneous 4x Daily AC & HS    pantoprazole   40 mg Oral QAM AC    nafcillin   2,000 mg IntraVENous Q4H    levothyroxine   25 mcg Oral Daily      Infusions:    sodium chloride       sodium chloride       sodium chloride       sodium chloride       dextrose  5 % and 0.45 % NaCl Stopped (03/28/24 2303)    dextrose        PRN Meds: furosemide , 20 mg, Daily PRN  sodium chloride  flush, 5-40 mL, PRN  sodium chloride , , PRN  acetaminophen , 650 mg, Q4H PRN  oxyCODONE , 5 mg, Q4H PRN   Or  oxyCODONE , 10 mg, Q4H PRN  HYDROmorphone , 0.25 mg, Q3H PRN   Or  HYDROmorphone , 0.5 mg, Q3H PRN  bisacodyl , 5 mg, Daily PRN  ondansetron , 4 mg, Q8H PRN   Or  ondansetron , 4 mg, Q6H PRN  sodium chloride , , PRN  potassium chloride , 40 mEq, PRN   Or  potassium alternative oral replacement, 40 mEq, PRN   Or  potassium chloride , 10 mEq, PRN  sodium chloride  flush, 5-40 mL, PRN  sodium chloride , , PRN  sodium chloride , , PRN  dextrose  bolus, 125 mL, PRN   Or  dextrose  bolus, 250 mL, PRN  magnesium  sulfate, 2,000 mg, PRN  sodium phosphate  15 mmol in sodium chloride  0.9 % 250 mL IVPB, 15 mmol, PRN  magnesium  sulfate, 2,000 mg, PRN  polyethylene glycol, 17 g, Daily PRN  dextrose  5 % and 0.45 % NaCl, , Continuous PRN  glucose, 4 tablet, PRN  glucagon , 1 mg, PRN  dextrose , , Continuous PRN  albuterol , 2.5 mg, Q6H PRN        Labs and Imaging   CT SOFT TISSUE NECK WO CONTRAST  Result Date: 03/28/2024  DATE: March 28, 2024 EXAM: CT SOFT TISSUE NECK WO CONTRAST  INDICATION: neck pain, fever; COMPARISON: None. TECHNIQUE: Axial CT imaging obtained from the skull base through the lung apices. Axial images and multiplanar reformatted images reviewed.   Up-to-date CT equipment and radiation dose reduction techniques were employed. IV Contrast: None FINDINGS: SCOUT: Nondiagnostic scout image(s) obtained for localization purposes, not for diagnostic use DEVICES/HARDWARE: None . ORBITS, PARANASAL SINUSES, SKULL BASE: Limited images of the base of the brain demonstrate no mass effect. Orbits are normal. Imaged sinuses are clear. CERVICAL SPINE/BONES: No destructive process. NASAL CAVITY/NASOPHARYNX: No mass. ORAL CAVITY/OROPHARYNX: No evidence of mass. HYPOPHARYNX AND LARYNX: Normal. DEEP NECK SPACES: The parapharyngeal, retropharyngeal and masticator spaces are normal. SALIVARY GLANDS: Submandibular glands, sublingual glands and parotid glands are normal. THYROID GLAND: Normal. LYMPH NODES: Nonenlarged cervical lymph nodes bilaterally. THORACIC INLET: No mass or acute abnormality involving the visualized mediastinal structures, trachea or lung apices. OTHER: No significant abnormality.     1. Negative. This dictation was created with voice recognition software.  While attempts have been made to review the dictation as it was transcribed, occasionally the spoken word can be misinterpreted  by the technology, leading to omissions or inappropriate words or phrases.  If questions or concerns arise, please contact  the Radiology Department. Electronically signed by Elouise Hugh, MD    CT ABDOMEN PELVIS WO CONTRAST Additional Contrast? None  Result Date: 03/28/2024  DATE: 03/28/2024 EXAM: CT ABDOMEN PELVIS WO CONTRAST INDICATION: hip pain Pain COMPARISON: 2015 TECHNIQUE: Axial CT imaging obtained from lung bases through pelvis. Axial images and multiplanar reformatted images are provided for review.  Up-to-date CT equipment and radiation dose reduction techniques were employed. IV  Contrast: None Oral Contrast: No. FINDINGS: SCOUT: Nondiagnostic scout image(s) obtained for localization purposes, not for diagnostic use DEVICES/HARDWARE: None . LUNG BASES: Clear. LIVER: There is decreased in attenuation. Liver is up to 25 cm in craniocaudal dimension. GALLBLADDER AND BILIARY TREE: No calcified gallstones. No gallbladder distention.  No intra- or extrahepatic biliary dilatation. PANCREAS: Normal. SPLEEN: Spleen is up to 14 cm in craniocaudal dimension. ADRENAL GLANDS: Normal. KIDNEYS AND URETERS: No hydronephrosis. Focal areas of striated enhancement of the kidneys bilaterally. Mild perinephric haziness bilaterally without fluid collection. BOWEL: Normal diameter, nonobstructed.  Appendix is not dilated or thickened. Distal colonic diverticulosis. URINARY BLADDER: Normal. REPRODUCTIVE ORGANS: No associated masses. LYMPH NODES: No abnormally enlarged nodes. PERITONEUM/RETROPERITONEUM: No ascites or free air. VESSELS: Aorta and IVC without significant abnormality.  ABDOMINAL WALL: Mild periarticular edema or swelling of the left hip. BONES: No significant abnormality. OTHER FINDINGS: None.     1. Diverticulosis of the colon without evidence of diverticulitis. 2. Focal areas of striated enhancement of the kidneys, previous CT contrast administration. Findings could indicate renal dysfunction or infection. 3. Mild periarticular edema or swelling of the left hip suggesting muscular injury/strain. 4. Hepatosplenomegaly. This dictation was created with voice recognition software.  While attempts have been made to review the dictation as it was transcribed, occasionally the spoken word can be misinterpreted  by the technology, leading to omissions or inappropriate words or phrases.  If questions or concerns arise, please contact the Radiology Department. Electronically signed by Elouise Hugh, MD    CT HEAD WO CONTRAST  Result Date: 03/28/2024  EXAM: CT HEAD WO CONTRAST STUDY DATE: 03/28/2024 11:15 EST  INDICATION: neck pain COMPARISON: None TECHNIQUE: Standard per department protocol. Up-to-date CT equipment and radiation dose reduction techniques were employed. CONTRAST: No intravenous FINDINGS: Brain Parenchyma: No midline shift, mass effect, parenchymal hemorrhage, or evidence of acute territorial infarct. Ventricular System and Extra-Axial Spaces: No extra-axial fluid collections. Basilar cisterns are patent. No hydrocephalus. Extracranial Structures: No significant paranasal sinus disease. No calvarial abnormality. No orbit abnormality.     *  No acute intracranial findings. Electronically signed by Isom Repress    CT CHEST PULMONARY EMBOLISM W CONTRAST  Result Date: 03/27/2024  DATE: 03/27/2024 EXAM: CT CHEST PULMONARY EMBOLISM W CONTRAST INDICATION: c/f PE, Chest pain COMPARISON: None. TECHNIQUE: Axial CT imaging obtained through the chest using CT pulmonary angiogram protocol. Axial images, multiplanar reformatted images and maximum intensity projection images were reviewed.  Up-to-date CT equipment and radiation dose reduction techniques were employed.  IV Contrast Media and volume: 80 cc iodinated contrast FINDINGS: SCOUT: Nondiagnostic scout image(s) obtained for localization purposes, not for diagnostic use DEVICES/HARDWARE: None . DIAGNOSTIC QUALITY: Suboptimal. PULMONARY EMBOLI: None. RIGHT HEART STRAIN: None. LUNGS AND AIRWAYS: Airways are patent.  Lungs are clear. PLEURA: No pleural effusions or significant pleural thickening. HEART / GREAT VESSELS: Heart is not enlarged. ADENOPATHY: None. CHEST WALL / LOWER NECK: No significant abnormality. UPPER ABDOMEN: Normal. BONES: No significant abnormality. OTHER FINDINGS: None.     1. No evidence of pulmonary embolus. This dictation was created with voice recognition  software.  While attempts have been made to review the dictation as it was transcribed, occasionally the spoken word can be misinterpreted  by the technology, leading to omissions or  inappropriate words or phrases.  If questions or concerns arise, please contact the Radiology Department. Electronically signed by Elouise Hugh, MD    XR CHEST PORTABLE  Result Date: 03/27/2024  Chest HISTORY : Dyspnea VIEWS : 1     Clear lungs. Normal cardiac mediastinal silhouette. Electronically signed by Lynwood Schwalbe, MD      CBC:   Recent Labs     04/04/24  0505 04/05/24  0622 04/06/24  0611   WBC 12.4* 12.5* 11.7*   HGB 9.0* 8.8* 8.8*   PLT 683* 622* 637*     BMP:    No results for input(s): NA, K, CL, CO2, BUN, CREATININE, GLUCOSE in the last 72 hours.    Hepatic: No results for input(s): AST, ALT, BILITOT, ALKPHOS in the last 72 hours.    Invalid input(s): ALB  Lipids:   Lab Results   Component Value Date/Time    CHOL 154 11/25/2023 02:44 PM    HDL 22 11/25/2023 02:44 PM    TRIG 170 11/25/2023 02:44 PM     Hemoglobin A1C:   Lab Results   Component Value Date/Time    LABA1C 13.9 03/27/2024 03:47 AM     TSH:   Lab Results   Component Value Date/Time    TSH 1.92 08/27/2022 04:35 PM     Troponin: No results found for: TROPONINT  Lactic Acid:   No results for input(s): LACTA in the last 72 hours.    BNP: No results for input(s): PROBNP in the last 72 hours.  UA:  Lab Results   Component Value Date/Time    NITRU Negative 03/28/2024 02:03 AM    COLORU Yellow 03/28/2024 02:03 AM    PHUR 6.0 03/28/2024 02:03 AM    PHUR 5.0 03/28/2024 02:03 AM    PHUR 5.5 02/25/2017 12:22 PM    WBCUA 3-5 03/28/2024 02:03 AM    RBCUA 5-10 03/28/2024 02:03 AM    BACTERIA 3+ 03/28/2024 02:03 AM    CLARITYU Clear 03/28/2024 02:03 AM    LEUKOCYTESUR TRACE 03/28/2024 02:03 AM    UROBILINOGEN 0.2 03/28/2024 02:03 AM    BILIRUBINUR Negative 03/28/2024 02:03 AM    BLOODU SMALL 03/28/2024 02:03 AM    GLUCOSEU Negative 03/28/2024 02:03 AM    KETUA Negative 03/28/2024 02:03 AM     Urine Cultures:   Lab Results   Component Value Date/Time    LABURIN 25,000 CFU/ml  PBP2= Negative   03/28/2024 01:54 AM     Blood  Cultures:   Lab Results   Component Value Date/Time    [facility]  04/04/2024 12:16 PM     No Growth to date.  Any change in status will be called.     Lab Results   Component Value Date/Time    BLOODCULT2 No Growth after 4 days of incubation. 03/30/2024 04:21 PM     Organism:   Lab Results   Component Value Date/Time    ORG Staphylococcus aureus 04/02/2024 02:09 PM         Electronically signed by Jeralyn Lory, MD on 04/06/2024 at 10:24 PM    "

## 2024-04-07 ENCOUNTER — Encounter

## 2024-04-07 LAB — CBC WITH AUTO DIFFERENTIAL
Basophils %: 0.6 %
Basophils Absolute: 0.1 K/uL (ref 0.0–0.2)
Eosinophils %: 1.4 %
Eosinophils Absolute: 0.1 K/uL (ref 0.0–0.6)
Hematocrit: 26.6 % — ABNORMAL LOW (ref 36.0–48.0)
Hemoglobin: 8.4 g/dL — ABNORMAL LOW (ref 12.0–16.0)
Lymphocytes %: 15.2 %
Lymphocytes Absolute: 1.6 K/uL (ref 1.0–5.1)
MCH: 25.8 pg — ABNORMAL LOW (ref 26.0–34.0)
MCHC: 31.5 g/dL (ref 31.0–36.0)
MCV: 82 fL (ref 80.0–100.0)
MPV: 7.7 fL (ref 5.0–10.5)
Monocytes %: 5.4 %
Monocytes Absolute: 0.6 K/uL (ref 0.0–1.3)
Neutrophils %: 77.4 %
Neutrophils Absolute: 8.1 K/uL — ABNORMAL HIGH (ref 1.7–7.7)
Platelets: 709 K/uL — ABNORMAL HIGH (ref 135–450)
RBC: 3.24 M/uL — ABNORMAL LOW (ref 4.00–5.20)
RDW: 17.3 % — ABNORMAL HIGH (ref 12.4–15.4)
WBC: 10.5 K/uL (ref 4.0–11.0)

## 2024-04-07 LAB — CULTURE, AERORBIC AND ANAROBIC, JOINT

## 2024-04-07 LAB — POCT GLUCOSE
POC Glucose: 188 mg/dL — ABNORMAL HIGH (ref 70–99)
POC Glucose: 199 mg/dL — ABNORMAL HIGH (ref 70–99)
POC Glucose: 155 mg/dL — ABNORMAL HIGH (ref 70–99)

## 2024-04-07 MED ORDER — ENOXAPARIN SODIUM 40 MG/0.4ML IJ SOSY
40 | Freq: Every day | INTRAMUSCULAR | Status: DC
Start: 2024-04-07 — End: 2024-04-07
  Administered 2024-04-07: 13:00:00 40 mg via SUBCUTANEOUS

## 2024-04-07 MED ORDER — ASPIRIN 81 MG PO TBEC
81 | ORAL_TABLET | Freq: Two times a day (BID) | ORAL | 3 refills | 30.00000 days | Status: DC
Start: 2024-04-07 — End: 2024-05-11

## 2024-04-07 MED ORDER — LEVOTHYROXINE SODIUM 25 MCG PO TABS
25 | ORAL_TABLET | Freq: Every day | ORAL | 3 refills | 30.00000 days | Status: DC
Start: 2024-04-07 — End: 2024-04-20

## 2024-04-07 MED ORDER — LANTUS SOLOSTAR 100 UNIT/ML SC SOPN
100 | Freq: Every evening | SUBCUTANEOUS | 3 refills | 50.00000 days | Status: DC
Start: 2024-04-07 — End: 2024-05-11

## 2024-04-07 MED ORDER — INSULIN LISPRO (1 UNIT DIAL) 100 UNIT/ML SC SOPN
100 | Freq: Three times a day (TID) | SUBCUTANEOUS | 3 refills | 62.00000 days | Status: DC
Start: 2024-04-07 — End: 2024-05-11

## 2024-04-07 MED FILL — OXYCODONE HCL 5 MG PO TABS: 5 mg | ORAL | Qty: 2 | Fill #0

## 2024-04-07 MED FILL — LEVOTHYROXINE SODIUM 25 MCG PO TABS: 25 ug | ORAL | Qty: 1 | Fill #0

## 2024-04-07 MED FILL — FERROUS SULFATE 325 (65 FE) MG PO TABS: 325 (65 Fe) MG | ORAL | Qty: 1 | Fill #0

## 2024-04-07 MED FILL — ASPIRIN LOW DOSE 81 MG PO TBEC: 81 mg | ORAL | Qty: 1 | Fill #0

## 2024-04-07 MED FILL — PANTOPRAZOLE SODIUM 40 MG PO TBEC: 40 mg | ORAL | Qty: 1 | Fill #0

## 2024-04-07 MED FILL — ENOXAPARIN SODIUM 40 MG/0.4ML IJ SOSY: 40 MG/0.4ML | INTRAMUSCULAR | Qty: 0.4 | Fill #0

## 2024-04-07 MED FILL — MONTELUKAST SODIUM 10 MG PO TABS: 10 mg | ORAL | Qty: 1 | Fill #0

## 2024-04-07 MED FILL — LANTUS 100 UNIT/ML SC SOLN: 100 [IU]/mL | SUBCUTANEOUS | Qty: 40 | Fill #0

## 2024-04-07 MED FILL — INSULIN LISPRO 100 UNIT/ML IJ SOLN: 100 [IU]/mL | INTRAMUSCULAR | Qty: 2 | Fill #0

## 2024-04-07 MED FILL — ACETAMINOPHEN 325 MG PO TABS: 325 mg | ORAL | Qty: 2 | Fill #0

## 2024-04-07 MED FILL — NAFCILLIN SODIUM 2 G IJ SOLR: 2 g | INTRAMUSCULAR | Qty: 2000 | Fill #0

## 2024-04-07 MED FILL — CETIRIZINE HCL 10 MG PO TABS: 10 mg | ORAL | Qty: 1 | Fill #0

## 2024-04-07 NOTE — Progress Notes (Signed)
"  All discharge instructions reviewed with patient. All questions and concerns addressed at this time. IV removed without complication. PICC remains in place for outpatient IV abx. Patient belongings gathered. Patient taken downstairs via wheelchair to the lobby to be picked up for discharge.    Electronically signed by Sheffield Dover, RN on 04/07/2024 at 4:31 PM   "

## 2024-04-07 NOTE — Progress Notes (Signed)
 "ID Follow-up NOTE    CC:   DKA, left hip pain  Antibiotics: Nafcillin     Admit Date: 03/26/2024  Hospital Day: 13    Subjective:     Patient is status post left hip bursa washout on 12/19. Low grade temp on 12/23. Leukocytosis resolved. Patient seen working with PT.      Objective:     Patient Vitals for the past 8 hrs:   BP Temp Temp src Pulse Resp SpO2   04/07/24 0815 (!) 152/89 98.7 F (37.1 C) Oral (!) 112 16 94 %     I/O last 3 completed shifts:  In: 1080 [P.O.:1080]  Out: -   No intake/output data recorded.    EXAM:  GENERAL: No apparent distress.    HEENT: Membranes moist, no oral lesion  NECK:  Supple, no lymphadenopathy  LUNGS: Clear b/l, no rales, no dullness  CARDIAC: RRR  ABD:  + BS, soft   EXT:  No rash, slight left hip pain.   NEURO: No focal neurologic findings  PSYCH: Orientation, sensorium, mood normal  LINES:  Peripheral iv       Data Review:  Lab Results   Component Value Date    WBC 10.5 04/07/2024    HGB 8.4 (L) 04/07/2024    HCT 26.6 (L) 04/07/2024    MCV 82.0 04/07/2024    PLT 709 (H) 04/07/2024     Lab Results   Component Value Date    CREATININE 0.5 (L) 04/02/2024    BUN 4 (L) 04/02/2024    NA 137 04/02/2024    K 3.2 (L) 04/02/2024    CL 98 (L) 04/02/2024    CO2 28 04/02/2024       Hepatic Function Panel:   Lab Results   Component Value Date/Time    ALKPHOS 154 11/25/2023 02:44 PM    ALT 16 11/25/2023 02:44 PM    AST 15 11/25/2023 02:44 PM    BILITOT 0.7 11/25/2023 02:44 PM    BILIDIR <0.2 02/03/2014 06:04 PM    IBILI see below 02/03/2014 06:04 PM       MICRO:  Blood cultures x 212/21: No growth to date    Or culture left hip bursa 12/19: Rare growth Staph aureus, MSSA    Blood cultures x 2 12/16: No growth    Left hip bursa aspirate culture 12/18: Moderate growth MSSA    Blood cultures x 2 12/15: Positive for MSSA    Blood cultures x 2 12/13: Positive for MSSA  Staphylococcus aureus (2)    Antibiotic Interpretation MIC  Method Status    clindamycin  Sensitive <=0.25 mcg/mL BACTERIAL  SUSCEPTIBILITY PANEL BY MIC     erythromycin  Sensitive <=0.25 mcg/mL BACTERIAL SUSCEPTIBILITY PANEL BY MIC     levofloxacin Resistant 4 mcg/mL BACTERIAL SUSCEPTIBILITY PANEL BY MIC     linezolid Sensitive 2 mcg/mL BACTERIAL SUSCEPTIBILITY PANEL BY MIC     oxacillin Sensitive 0.5 mcg/mL BACTERIAL SUSCEPTIBILITY PANEL BY MIC     tetracycline Sensitive <=1 mcg/mL BACTERIAL SUSCEPTIBILITY PANEL BY MIC      Tetracyclines have decreased renal elimination and  urinary bladder concentrations.  Use of this class of  agents for urinary tract infection should be limited to  patients that cannot tolerate an alternative therapy.       trimethoprim-sulfamethoxazole Sensitive <=10 mcg/mL BACTERIAL SUSCEPTIBILITY PANEL BY MIC     vancomycin  Sensitive 1 mcg/mL BACTERIAL SUSCEPTIBILITY PANEL BY MIC       Urine culture 12/14: 25,000 CFU MSSA  IMAGING: I have independently reviewed the images and reports.       MRI left hip with and without contrast 12/17:  1. There is a 4.6 cm left greater trochanter bursal fluid collection with thin tract which extends superiorly into the left gluteus medius muscle. There is extensive surrounding edema as well as edema within the left gluteus medius muscle, and lateral   aspect of the adjacent left gluteus maximus muscle. In the setting of infection this is most worrisome for a septic bursitis-abscess.  2. No evidence of any osteomyelitis.  3. No acute osseous abnormality.    X-ray left hip 12/16:  1. No acute fracture or dislocation.  2. Mild degenerative changes of the hips.    CT abdomen pelvis without contrast 12/14:  1. Diverticulosis of the colon without evidence of diverticulitis.  2. Focal areas of striated enhancement of the kidneys, previous CT contrast administration. Findings could indicate renal dysfunction or infection.  3. Mild periarticular edema or swelling of the left hip suggesting muscular injury/strain.  4. Hepatosplenomegaly.    CT chest PE 12/13:  No evidence of pulmonary  embolus.     Scheduled Meds:   enoxaparin   40 mg SubCUTAneous Daily    insulin  glargine  40 Units SubCUTAneous Nightly    insulin  lispro  10 Units SubCUTAneous TID WC    [Held by provider] valACYclovir   500 mg Oral Daily    cetirizine   10 mg Oral Daily    montelukast   10 mg Oral Daily    ferrous sulfate   325 mg Oral Daily with breakfast    sodium chloride  flush  5-40 mL IntraVENous 2 times per day    aspirin   81 mg Oral BID    sodium chloride  flush  5-40 mL IntraVENous 2 times per day    insulin  lispro  0-8 Units SubCUTAneous 4x Daily AC & HS    pantoprazole   40 mg Oral QAM AC    nafcillin   2,000 mg IntraVENous Q4H    levothyroxine   25 mcg Oral Daily       Continuous Infusions:   sodium chloride       sodium chloride       sodium chloride       sodium chloride       dextrose  5 % and 0.45 % NaCl Stopped (03/28/24 2303)    dextrose          PRN Meds:  furosemide , sodium chloride  flush, sodium chloride , acetaminophen , oxyCODONE  **OR** oxyCODONE , HYDROmorphone  **OR** HYDROmorphone , bisacodyl , ondansetron  **OR** ondansetron , sodium chloride , potassium chloride  **OR** potassium alternative oral replacement **OR** potassium chloride , sodium chloride  flush, sodium chloride , sodium chloride , dextrose  bolus **OR** dextrose  bolus, magnesium  sulfate, sodium phosphate  15 mmol in sodium chloride  0.9 % 250 mL IVPB, magnesium  sulfate, polyethylene glycol, dextrose  5 % and 0.45 % NaCl, glucose, glucagon , dextrose , albuterol       Assessment:       Patient Active Problem List   Diagnosis    Mixed hyperlipidemia    Iron  deficiency anemia    Type 2 diabetes mellitus with hyperglycemia, with long-term current use of insulin  (HCC)    Essential hypertension    Acquired hypothyroidism    Mild intermittent asthma without complication    Allergic rhinitis    Heart murmur    Uterine fibroid    Class 1 obesity due to excess calories with serious comorbidity and body mass index (BMI) of 31.0 to 31.9 in adult    Abnormal uterine bleeding    Eczema  Glaucoma suspect, bilateral    Hidradenitis axillaris    HSV-2 infection    Myopic astigmatism, bilateral    Strabismus    S/P myomectomy    DKA, type 2, not at goal [facility])    Acute nonintractable headache    MSSA bacteremia    Diabetic ketoacidosis without coma associated with type 2 diabetes mellitus (HCC)    Left hip pain    Abscess of bursa of left hip        Plan:   38 year old African-American female with history of DM2, HTN, HLD that presented on 12/13 with headache and shortness of breath.  Patient has recently lost her health insurance since 12/2023 and was not taking any of her medications.  She was found to be in DKA and was admitted to the ICU.      MSSA bacteremia and left hip greater trochanteric bursitis:  - upon presentation, she was also noted to be tachycardic, hypertensive and had leukocytosis.    - Infectious workup with blood cultures 2 of 2 sets obtained on admission positive for MSSA.   - Patient was initially started on vancomycin  which has now been transitioned to nafcillin .    - In speaking to the patient and is unclear what the source of MSSA bacteremia.  She denies any recent skin or soft tissue infections, no skin tears, she has several tattoos noted which are not new.    - She adamantly denies any form of drug use.  UDS on admission was negative.    - Of note urine culture also showing 25,000 CFU of MSSA which is likely overflow effect from hematogenous infection.    - Her main complaint at this time is left hip pain and this is worsened with weightbearing.    - we rec MRI which showed 4.6cm left greater trochanteric bursal fluid collection.  This was aspirated with IR and yielded growth of MSSA.    - orthopedics evaluated and she was taken for operative washout of the bursa on 12/19 with operative cultures showing growth of the same.  -We will continue nafcillin .  Repeated sets of blood cultures have remained negative.  TTE without any evidence of IE  - Given patient's lack of insurance,  she will not qualify for home health and IV antibiotics at home and will likely need to come in to [facility] via charity care for IV antibiotics.   - Given this, we will plan to change to daptomycin  when ready for discharge due to its once a day administration.   - s/p test dose dapto on 12/18 and COC in place to start the process of possible charity care.   - PICC line placed 12/22. Ok to use.  - Patient had a fever on 12/22. If recurs, consider resending blood cultures  - Infusion center approved antibiotics and anticipated to start outpatient infusions tomorrow (12/25)    DM2:  - Good glycemic control to aid in healing and prevention of further infections.  - A1c on this admission was 13.9      INFUSION ORDERS:  - Drug: IV daptomycin  400 mg daily  - Planned End date: 04/30/2024  - Diagnosis: MSSA bacteremia, left hip septic bursitis   - Has received test dose in hospital  - Routine line maintenance  - Check CBC w diff, CMP, ESR, CRP, CK  every Mon or Tue - FAX result to [phone]  - Call with antibiotic / infusion issues, [phone]  - Call with any change in status,  transfer in or out of a facility or to hospital; This COC is only valid for current disposition - [phone].    - No f/u in outpatient ID office necessary       Medical Decision Making:  The following items were considered in medical decision making:  Discussion of patient care with other providers  Reviewed clinical lab tests  Reviewed radiology tests  Reviewed other diagnostic tests/interventions  Independent review of radiologic images  Microbiology cultures and other micro tests reviewed      Please note that this chart was generated using Dragon dictation software. Although every effort was made to ensure the accuracy of this automated transcription, some errors in transcription may have occurred inadvertently.  Any pictures or media included in this note were obtained after taking informed verbal consent from the patient and with their approval to  include those in the patient's medical record.     Discussed with Dr. He.    Dorn Ovens, MD  Internal Medicine, PGY-3  "

## 2024-04-07 NOTE — Discharge Summary (Signed)
 "    V2.0  Discharge Summary    Name:  Shelly Richard DOB/Age/Sex: [birth year] (38 y.o. female)   Admit Date: 03/26/2024  Discharge Date: 04/07/24    MRN & CSN:  [id number] & [id number] Encounter Date and Time 04/07/24 1:55 PM EST    Attending:  Evern Richard, Sheree* Discharging Provider: Sheree CHRISTELLA Shelly Aid, MD       Hospital Course:       [name] is a 38 y.o. female with pmh of diabetes, hypertension, hyperlipidemia who presents with DKA, type 2, not at goal [facility]) with sepsis.  Patient has not been taking her medications due to insurance issues.  DKA was treated with an insulin  drip and now switched to subcutaneous insulin .  Workup revealed MSSA bacteremia.  Patient CT scan showed some fluid around the hip MRI confirmed possible trochanteric bursal infection.  Underwent aspiration on 12/18, culture grew staph.  Underwent I&D of abscess on 12/19.  Was treated with IV antibiotics with daptomycin .  PICC line was placed and plan was to set up patient with charity for outpatient infusion center for completion of daptomycin .  Patient's insulin  was also provided and on 04/07/2024 patient was deemed stable for discharge      DKA resolved  Uncontrolled type 2 diabetes  will need charity meds on discharge -provided  A1c 13.9   Glucose now controlled  - On Lantus  40U QHS, Lispro 10U TID w meals + MDSSI         Sepsis present on admission 22  MSSA bacteremia  patient will need charity IV antibiotics social worker aware - this was set up-at discharge  - per ID, discharge plan daptomycin  400 mg daily last day 04/30/2024     Acute on chronic anemia    Hemoglobin dropped from 10.9 to 5.7   Iron  studies shows iron  deficiency but with bacteremia hold off IV iron   In the past patient needed iron  transfusions  In the past patient had a lot of menstrual bleeding and was on Femara for fibroid  Hgb now stable -at discharge blood will require following  No sign of bleeding     Hypothyroidism  Resume home  Levothyroid at  discharge     Hip pain-with septic bursitis-monitor drain output, culture positive for staph  -s/p I&D on 12/19 culture positive for staph  - CT shows some fluid collection  - MRI done on 12/17 shows a 4.6 cm left greater trochanteric bursal fluid collection which tracks into the gluteus muscle with surrounding edema concern for septic bursitis/abscess  - ortho following; pt to follow up outpatient.  - Daptomycin  as per ID with end date 04/30/2024    The patient expressed appropriate understanding of, and agreement with the discharge recommendations, medications, and plan.     Consults this admission:  IP CONSULT TO CRITICAL CARE  IP CONSULT TO DIABETES EDUCATOR  PHARMACY TO DOSE VANCOMYCIN   IP CONSULT TO INFECTIOUS DISEASES  IP CONSULT TO ORTHOPEDIC SURGERY  IP CONSULT TO VASCULAR ACCESS TEAM  IP CONSULT TO SOCIAL WORK    Discharge Diagnosis:   DKA, type 2, not at goal [facility])        Discharge Instruction:   Follow up appointments: PCP, Ortho  Primary care physician: Shelly Bernice FORBES, [name] within 1 week  Diet: diabetic diet   Activity: activity as tolerated  Disposition: Discharged to:   [] Home, [x] HHC, [] SNF, [] Acute Rehab, [] Hospice   Condition on discharge: Stable  Labs and Tests  to be Followed up as an outpatient by PCP or Specialist: As above    Discharge Medications:        Medication List        CONTINUE taking these medications      [employer] Blood Glucose Meter w/Device Kit  Please provide meter approved by insurance.  Check bs qam     * FreeStyle Libre 2 Sensor Misc  USE TO TEST BLOOD GLUCOSE AS DIRECTED     * FreeStyle Libre 2 Sensor Misc  APPLY SENSOR TO SKIN FOR CONTINUOUS BLOOD SUGAR MONITORING, REPLACE EVERY 14 DAYS     * [employer] Pen Needles 31G 31G X 8 MM Misc  Generic drug: Insulin  Pen Needle  1 each by Does not apply route daily     * Insulin  Pen Needle 32G X 6 MM Misc  Use four times daily and as needed     * Droplet Pen Needles 32G X 6 MM Misc  Generic drug: Insulin  Pen Needle  USE ONCE DAILY      Lancets Misc  1 each by Does not apply route daily Please provide lancets approved by insurance.           * This list has 5 medication(s) that are the same as other medications prescribed for you. Read the directions carefully, and ask your doctor or other care provider to review them with you.                ASK your doctor about these medications      acetaminophen  500 MG tablet  Commonly known as: TYLENOL   Take 1 tablet by mouth 4 times daily as needed for Pain     albuterol  sulfate HFA 108 (90 Base) MCG/ACT inhaler  Commonly known as: PROVENTIL ;VENTOLIN ;PROAIR   Inhale 2 puffs into the lungs every 6 hours as needed for Wheezing or Shortness of Breath     blood glucose test strips  Tes2t qam  times a day & as needed for symptoms of irregular blood glucose. Please provide test strips approved by insurance.     cetirizine  10 MG tablet  Commonly known as: ZYRTEC   Take 1 tablet by mouth daily     dapagliflozin  10 MG tablet  Commonly known as: Farxiga   Take 1 tablet by mouth every morning     EPINEPHrine  0.3 MG/0.3ML Soaj injection  Commonly known as: EPIPEN      ferrous sulfate  325 (65 Fe) MG tablet  Commonly known as: FeroSul  Take 1 tablet by mouth daily (with breakfast)     furosemide  20 MG tablet  Commonly known as: Lasix   Take 1 tablet by mouth daily as needed (swelling)     ibuprofen  600 MG tablet  Commonly known as: ADVIL ;MOTRIN   Take 1 tablet by mouth every 6 hours as needed for Pain     insulin  detemir 100 UNIT/ML injection pen  Commonly known as: Levemir  FlexPen  INJECT 20 UNITS UNDER THE SKIN ONCE NIGHTLY     insulin  lispro (1 Unit Dial ) 100 UNIT/ML Sopn  Commonly known as: HUMALOG /ADMELOG   INJECT 10 UNITS UNDER THE SKIN WITH MEALS PLUS INJECT 2 UNITS FOR BLOOD SUGAR  150-200, 4 UNITS IF 201-250, 6 UNITS IF 251-300, 8 UNITS IF 301-350, 10 UNITS IF GREATER THAN 350     Lantus  SoloStar 100 UNIT/ML injection pen  Generic drug: insulin  glargine  Inject 40 Units into the skin nightly     letrozole 2.5 MG  tablet  Commonly known as: FEMARA  levothyroxine  75 MCG tablet  Commonly known as: SYNTHROID   TAKE ONE TABLET BY MOUTH DAILY     losartan  25 MG tablet  Commonly known as: COZAAR   TAKE ONE TABLET BY MOUTH DAILY     metFORMIN  500 MG extended release tablet  Commonly known as: GLUCOPHAGE -XR  Take 4 tablets by mouth daily (with breakfast)     montelukast  10 MG tablet  Commonly known as: SINGULAIR   Take 1 tablet by mouth daily     Ozempic  (1 MG/DOSE) 4 MG/3ML Sopn sc injection  Generic drug: Semaglutide  (1 MG/DOSE)  Inject 1 mg into the skin every 7 days     simvastatin  40 MG tablet  Commonly known as: ZOCOR   Take 1 tablet by mouth daily     valACYclovir  500 MG tablet  Commonly known as: VALTREX   TAKE 1 TABLET BY MOUTH DAILY     Vitamin D3 50 MCG (2000 UT) Tabs  TAKE ONE TABLET BY MOUTH DAILY             Objective Findings at Discharge:   BP 131/78   Pulse (!) 115   Temp 98.7 F (37.1 C) (Oral)   Resp 16   Ht 1.626 m (5' 4)   Wt 93.5 kg (206 lb 2.1 oz)   LMP 03/23/2024   SpO2 96%   BMI 35.38 kg/m       Physical Exam:     General: NAD  Eyes: EOMI  ENT: neck supple  Cardiovascular: Regular rate.  Respiratory: Clear to auscultation  Gastrointestinal: Soft, non tender  Genitourinary: no suprapubic tenderness  Musculoskeletal: No edema  Skin: warm, dry  Neuro: Alert.  Psych: Mood appropriate.         Labs and Imaging   CT ABSCESS DRAINAGE SOFT TISSUE  Result Date: 04/01/2024  History:hip aspiration left hip infection. Trochanteric bursitis. PROCEDURE: CT-guided left hip abscess aspiration. Operator:Robert Mannie M.D. Assistant: None. Consent: Oral and written consent was obtained. The risks, benefits, and alternatives to the procedure were discussed. Sedation: Vital signs were monitored by the radiology nurse. The radiologist was in attendance for the entire sedation episode.    Moderate conscious sedation provided with fentanyl  and Versed .. Sedation begin time: 9:00 Sedation end time: None: 30  TECHNIQUE/procedure: Initial CT images were obtained localizing the complex fluid adjacent to the left greater trochanter. Individualized dose optimization technique was used in order to meet ALARA standards for radiation dose reduction.  In addition to vendor specific dose reduction algorithms, the dose reduction techniques vary based on the specific scanner utilized but frequently include automated exposure control, adjustment of the mA and/or kV according to patient size, and use of iterative reconstruction technique. The left lateral hip was prepped and draped in the usual sterile fashion. Local anesthesia was provided with lidocaine . Using CT guidance, 5 French catheter was inserted by trocar technique into the complex fluid collection. Approximately 3 mL of purulent fluid was aspirated. No acute complication. Estimated blood loss: Less than 5 mL.     1. Successful aspiration of a abscess adjacent to the left greater trochanter without acute complication. Electronically signed by Lamar Mannie    MRI HIP LEFT W WO CONTRAST  Result Date: 03/31/2024  INDICATION:  hip abscess. Hip pain. TECHNIQUE: Multiplanar and multisequence MR images of the pelvis and left hip were obtained prior to and from the intravenous administration of Gadavist contrast. FINDINGS: There is no evidence of any left hip or pelvic fracture. Trace bilateral hip joint effusions are considered to  be physiologic. Marrow signal is normal. There is a 4.6 x 1.5 x 4.0 cm left greater trochanteric fluid collection with surrounding soft tissue edema. There is a thin tract which extends superiorly into the distal peripheral aspect of the left gluteus medius muscle. There is extensive edema identified within the left gluteus medius muscle. There is some mild edema identified within the lateral aspect of the left gluteus maximus muscle. There is some mild edema identified within the left hip adductor musculature. No tendon tear or tendinopathy is  identified. There are some small uterine fibroids. No adnexal mass is identified. No pelvic ascites is identified.     1. There is a 4.6 cm left greater trochanter bursal fluid collection with thin tract which extends superiorly into the left gluteus medius muscle. There is extensive surrounding edema as well as edema within the left gluteus medius muscle, and lateral aspect of the adjacent left gluteus maximus muscle. In the setting of infection this is most worrisome for a septic bursitis-abscess. 2. No evidence of any osteomyelitis. 3. No acute osseous abnormality. Electronically signed by Morene Grounds, MD      CBC:   Recent Labs     04/05/24  0622 04/06/24  0611 04/07/24  0627   WBC 12.5* 11.7* 10.5   HGB 8.8* 8.8* 8.4*   PLT 622* 637* 709*     BMP:  No results for input(s): NA, K, CL, CO2, BUN, CREATININE, GLUCOSE in the last 72 hours.  Hepatic: No results for input(s): AST, ALT, BILITOT, ALKPHOS in the last 72 hours.    Invalid input(s): ALB  Lipids:   Lab Results   Component Value Date/Time    CHOL 154 11/25/2023 02:44 PM    HDL 22 11/25/2023 02:44 PM    TRIG 170 11/25/2023 02:44 PM     Hemoglobin A1C:   Lab Results   Component Value Date/Time    LABA1C 13.9 03/27/2024 03:47 AM     TSH:   Lab Results   Component Value Date/Time    TSH 1.92 08/27/2022 04:35 PM     Troponin: No results found for: TROPONINT  Lactic Acid: No results for input(s): LACTA in the last 72 hours.  BNP: No results for input(s): PROBNP in the last 72 hours.  UA:  Lab Results   Component Value Date/Time    NITRU Negative 03/28/2024 02:03 AM    COLORU Yellow 03/28/2024 02:03 AM    PHUR 6.0 03/28/2024 02:03 AM    PHUR 5.0 03/28/2024 02:03 AM    PHUR 5.5 02/25/2017 12:22 PM    WBCUA 3-5 03/28/2024 02:03 AM    RBCUA 5-10 03/28/2024 02:03 AM    BACTERIA 3+ 03/28/2024 02:03 AM    CLARITYU Clear 03/28/2024 02:03 AM    LEUKOCYTESUR TRACE 03/28/2024 02:03 AM    UROBILINOGEN 0.2 03/28/2024 02:03 AM    BILIRUBINUR  Negative 03/28/2024 02:03 AM    BLOODU SMALL 03/28/2024 02:03 AM    GLUCOSEU Negative 03/28/2024 02:03 AM    KETUA Negative 03/28/2024 02:03 AM     Urine Cultures:   Lab Results   Component Value Date/Time    LABURIN 25,000 CFU/ml  PBP2= Negative   03/28/2024 01:54 AM     Blood Cultures:   Lab Results   Component Value Date/Time    [facility]  04/04/2024 12:16 PM     No Growth to date.  Any change in status will be called.     Lab Results   Component Value Date/Time  BLOODCULT2 No Growth after 4 days of incubation. 03/30/2024 04:21 PM     Organism:   Lab Results   Component Value Date/Time    ORG Staphylococcus aureus 04/02/2024 02:09 PM       Time Spent Discharging patient 41 minutes    Electronically signed by Sheree CHRISTELLA Shelly Marylyn, MD on 04/07/2024 at 1:55 PM    "

## 2024-04-07 NOTE — Progress Notes (Signed)
 "Occupational Therapy  Facility/Department: [facility] 5T ORTHO/NEURO  Occupational Therapy Treatment    Name: Shelly Richard  DOB: May 09, 1985  MRN: [id number]  Date of Service: 04/07/2024    Discharge Recommendations:  24 hour supervision or assist, Home with Home health OT  OT Equipment Recommendations  Equipment Needed: Yes  Walker: Rolling  ADL Assistive Devices: Transfer Tub Bench       Patient Diagnosis(es): The primary encounter diagnosis was Acute nonintractable headache, unspecified headache type. Diagnoses of Diabetic ketoacidosis without coma associated with type 2 diabetes mellitus (HCC), Heart murmur, MSSA bacteremia, and Abscess of bursa of left hip were also pertinent to this visit.  Past Medical History:  has a past medical history of Acute headache, Allergies, Asthma, Diabetes mellitus (HCC), Genital herpes, HTN (hypertension), Hyperlipidemia, Hypothyroid, and Urticaria.  Past Surgical History:  has a past surgical history that includes cyst removal; hysteroscopy; myomectomy (N/A, 02/01/2022); CT ABSCESS DRAINAGE SOFT TISSUE (04/01/2024); and hip surgery (Left, 04/02/2024).    Treatment Diagnosis: impaired ADLs, mobility and transfers      Assessment  Performance deficits / Impairments: Decreased functional mobility ;Decreased endurance;Decreased ADL status;Decreased balance;Decreased strength  Assessment: Pt is a 38 y.o F from home with family who reports working full time and is independent with all ADLs and mobility without AE. Pt currently functioning significantly below baseline due to generalized debility, weakness, pain to L hip, affecting ADL performance, transfers and requiring use of RW for mobility. Pt reports having good support at home and may benefit from home health OT to establish HEP and continue to improve functional independence. Pt will need RW upon dc and would benefit from a TTB vs shower chair for energy conservation. Will follow during inpt stay.  Treatment Diagnosis: impaired  ADLs, mobility and transfers  REQUIRES OT FOLLOW-UP: Yes  Activity Tolerance  Activity Tolerance: Patient Tolerated treatment well     Plan  Occupational Therapy Plan  Times Per Week: 2-5 times in acute care setting  Current Treatment Recommendations: Strengthening, Self-Care / ADL, Balance training, Home management training, Safety education & training, Functional mobility training, Patient/Caregiver education & training, Endurance training, Equipment evaluation, education, & procurement  Additional Comments: see above for post acute recommendations    Restrictions  Position Activity Restriction  Other Position/Activity Restrictions: up with assist, full WB on LLE    Subjective  General  Chart Reviewed: Yes  Patient assessed for rehabilitation services?: Yes  Additional Pertinent Hx: The patient is a 38 y.o. female who presents with above chief complaint.  Admitted recently due to DKA with suspected UTI and bacteremia. Reports left hip pain. Family member states prior to admission patient was sitting cross-legged for long periods of time in bed over 2 weeks. No history of prior reported hip injury or instrumentation; s/p INCISION AND DRAINAGE OF LEFT HIP GREATER TROCHANTERIC BURSA 12/19  Family / Caregiver Present: Yes  Referring Practitioner: Sherline Lamar Gaskins, MD  Diagnosis: DKA type 2  Subjective  Subjective: Pt resting in bed upon arrival and agreeable to OT session. Pt reporting 8/10 L hip pain     Social/Functional History  Social/Functional History  Lives With: Family (2 kids aged 78, 80, 45 year old grandson and boyfriend)  Type of Home:  (townhouse)  Home Layout: Two level, Bed/Bath upstairs, 1/2 bath on main level  Home Access: Stairs to enter without rails  Entrance Stairs - Number of Steps: 1 STE, 14 steps with unilateral HR to second floor  Bathroom Shower/Tub: Tub/Shower unit  [employer]  Toilet: Standard  Has the patient had two or more falls in the past year or any fall with injury in the past year?:  No  Receives Help From: Family  Prior Level of Assist for ADLs: Independent  Prior Level of Assist for Homemaking: Independent  Prior Level of Assist for Ambulation: Independent household ambulator, with or without device  Prior Level of Assist for Transfers: Surveyor, Minerals: Yes  Type of Occupation: paraprofessional  Leisure & Hobbies: craft    Objective  Temp: 98.7 F (37.1 C)  Pulse: (!) 115  Heart Rate Source: Monitor  Respirations: 16  SpO2: 96 %  O2 Device: None (Room air)  BP: 131/78  MAP (Calculated): 96  BP Location: Left upper arm  BP Method: Automatic  Patient Position: Semi fowlers  Vision  Vision: Impaired  Vision Exceptions: Wears glasses at all times  Hearing  Hearing: Within functional limits          Safety Devices  Type of Devices: Left in bed;Bed alarm in place;Call light within reach;Nurse notified           ADL  Putting On/Taking Off Footwear: Dependent/Total  Putting On/Taking Off Footwear Skilled Clinical Factors: don socks  Functional Mobility: Stand by assistance  Functional Mobility Skilled Clinical Factors: pt ambulated 143ft using RW w/ SBA  Additional Comments: Pt declined all additional ADLs this date  Product Used : Soap and water                  Cognition  Overall Cognitive Status: WNL  Orientation  Overall Orientation Status: Within Normal Limits  Orientation Level: Oriented X4                  Education Given To: Patient  Education Provided: Role of Therapy;Plan of Care;Equipment;ADL Metallurgist Provided Comments: where to get shower chair  Education Method: Verbal  Barriers to Learning: None  Education Outcome: Verbalized understanding;Demonstrated understanding;Continued education needed                     G-Code     OutComes Score                                                  AM-PAC - ADL  AM-PAC Daily Activity - Inpatient   How much help is needed for putting on and taking off regular lower body clothing?: A  Lot  How much help is needed for bathing (which includes washing, rinsing, drying)?: A Lot  How much help is needed for toileting (which includes using toilet, bedpan, or urinal)?: A Little  How much help is needed for putting on and taking off regular upper body clothing?: None  How much help is needed for taking care of personal grooming?: None  How much help for eating meals?: None  AM-PAC Inpatient Daily Activity Raw Score: 19  AM-PAC Inpatient ADL T-Scale Score : 40.22  ADL Inpatient CMS 0-100% Score: 42.8  ADL Inpatient CMS G-Code Modifier : CK    Tinneti Score       Goals  Short Term Goals  Time Frame for Short Term Goals: by dc  Short Term Goal 1: Pt will complete LB dressing mod I  Short Term Goal 2: Pt will complete toileting and toilet transfer mod I  Short Term  Goal 3: Pt will complete bed mobility mod I  Patient Goals   Patient goals : to go home      Therapy Time   Individual Concurrent Group Co-treatment   Time In 1315         Time Out 1400         Minutes 45         Timed Code Treatment Minutes: [address] OT     "

## 2024-04-07 NOTE — Progress Notes (Signed)
"  Patient is A&Ox4. VSS on RA. Patient has endorsed pain to left hip managed  per [facility] and non-pharm measures. No acute changes during shift. Patient is tolerating PO diet. Tolerating ambulation x 1 assist with GB/walker. Voiding via BRP.CHG bath and line care performed. Pt denies any needs at this time.Patient updated on plan of care. Fall and safety precautions in place, call light within reach. Continuing care.  "

## 2024-04-07 NOTE — Plan of Care (Signed)
 Problem: Safety - Adult  Goal: Free from fall injury  Outcome: Progressing   All fall precautions in place. Bed locked and in lowest position with alarm on. Overbed table and personal belonings within reach. Call light within reach and patient instructed to use call light for assistance. Non-skid socks on.  Problem: Pain  Goal: Verbalizes/displays adequate comfort level or baseline comfort level  Outcome: Progressing  Pt endorsing pain to L hip. Being treated with PRN pain medication, rest, and frequent repositioning with pillow support for comfort and pressure relief. Pt reports some relief from pain with above interventions.

## 2024-04-07 NOTE — Care Coordination (Signed)
 "         Case Management Assessment            Discharge Note                    Date / Time of Note: 04/07/2024 1:57 PM                  Discharge Note Completed by: Nat LITTIE Dais, RN    Patient Name: Shelly Richard   Date of Birth: 06-07-1985  Diagnosis: DKA, type 2, not at goal [facility]) [E11.10]  Diabetic ketoacidosis without coma associated with type 2 diabetes mellitus (HCC) [E11.10]  Acute nonintractable headache, unspecified headache type [R51.9]   Date / Time: 03/26/2024 11:05 PM    Current PCP: Alexandro Bernice FORBES, [name]  Clinic patient: No    Hospitalization in the last 30 days: No       Advance Directives:  Code Status: Full Code  [place]  DNR form completed and on chart: Not Indicated    Financial:  Payor: /      Pharmacy:    [facility] [id number] - [place], [place] - [address] - P [phone] - F [phone]  [address] [place] [address]  Phone: [phone] Fax: [phone]      Assistance purchasing medications?:    Assistance provided by Case Management: None at this time    Does patient want to participate in local refill/ meds to beds program?:      Meds To Beds General Rules:  1. Can ONLY be done Monday- Friday between 8:30am-5pm  2. Prescription(s) must be in pharmacy by 3pm to be filled same day  3.Copy of patient's insurance/ prescription drug card and patient face sheet must be sent along with the prescription(s)  4. Cost of Rx cannot be added to hospital bill. If financial assistance is needed, please contact unit case manager or child psychotherapist; Case manager or Social Worker CANNOT provide pharmacy voucher for patients co-pays  5. Patients can then pick up the prescription on their way out of the hospital at discharge, or pharmacy can deliver to the bedside if staff is available. (payment due at time of pick-up or delivery - cash, check, or card accepted)     Able to afford home medications/ co-pay costs: Yes    ADLS:  Current PT AM-PAC Score: 17 /24  Current  OT AM-PAC Score: 19 /24    DISCHARGE Disposition: Home- No Services Needed    LOC at discharge: Not Applicable  COC Completed: Not Indicated    Notification completed in HENS/PAS?:  Not Applicable    IMM Completed:   Not Indicated due to: no insurance         Transportation:  Research Scientist (medical) for discharge: family   Mode of Transport: Hydrographic Surveyor  Reason for medical transport: Not Applicable  Name of Transport Company: Not Applicable  Time of Transport: tbd    Transport form completed: Not Indicated    Home Care:  Home Care ordered at discharge: Not Indicated  Home Care Agency: Not Applicable  Orders faxed: No    Durable Medical Equipment:  DME Provider: n/a  Equipment obtained during hospitalization: walker      Additional CM Notes: Patient is discharging home with family.  CM spoke with charge nurse in outpatient infusion who states they have approval for pt to start tomorrow.  Patient has appointment at [facility] 04/08/24 and 0930 on 04/09/24.  Patient's  family to transport.    COVID Result:    Lab Results   Component Value Date/Time    COVID19 NOT DETECTED 03/27/2024 04:07 PM    COVID19 DETECTED 04/22/2019 02:15 PM       The Plan for Transition of Care is related to the following treatment goals of DKA, type 2, not at goal [facility]) [E11.10]  Diabetic ketoacidosis without coma associated with type 2 diabetes mellitus (HCC) [E11.10]  Acute nonintractable headache, unspecified headache type [R51.9]    The Patient and/or patient representative Patrica and her family were provided with a choice of provider and agrees with the discharge plan Yes    Freedom of choice list was provided with basic dialogue that supports the patient's individualized plan of care/goals and shares the quality data associated with the providers. Yes    Care Transitions patient: Yes    Nat LITTIE Dais, RN  [facility]  Case Management Department  Ph: [phone]  Fax: [phone]    "

## 2024-04-08 ENCOUNTER — Inpatient Hospital Stay: Admit: 2024-04-08 | Discharge: 2024-04-08 | Primary: Oncology

## 2024-04-08 VITALS — BP 129/91 | HR 112 | Temp 98.30000°F | Resp 18

## 2024-04-08 DIAGNOSIS — R7881 Bacteremia: Secondary | ICD-10-CM

## 2024-04-08 LAB — CBC WITH AUTO DIFFERENTIAL
Basophils %: 0.7 %
Basophils Absolute: 0.1 K/uL (ref 0.0–0.2)
Eosinophils %: 1 %
Eosinophils Absolute: 0.1 K/uL (ref 0.0–0.6)
Hematocrit: 27.2 % — ABNORMAL LOW (ref 36.0–48.0)
Hemoglobin: 9.2 g/dL — ABNORMAL LOW (ref 12.0–16.0)
Lymphocytes %: 10.3 %
Lymphocytes Absolute: 1.1 K/uL (ref 1.0–5.1)
MCH: 27.3 pg (ref 26.0–34.0)
MCHC: 33.7 g/dL (ref 31.0–36.0)
MCV: 81.2 fL (ref 80.0–100.0)
MPV: 7.5 fL (ref 5.0–10.5)
Monocytes %: 4.7 %
Monocytes Absolute: 0.5 K/uL (ref 0.0–1.3)
Neutrophils %: 83.3 %
Neutrophils Absolute: 9.1 K/uL — ABNORMAL HIGH (ref 1.7–7.7)
Platelets: 785 K/uL — ABNORMAL HIGH (ref 135–450)
RBC: 3.35 M/uL — ABNORMAL LOW (ref 4.00–5.20)
RDW: 16.8 % — ABNORMAL HIGH (ref 12.4–15.4)
WBC: 11 K/uL (ref 4.0–11.0)

## 2024-04-08 LAB — COMPREHENSIVE METABOLIC PANEL
ALT: 12 U/L (ref 10–40)
AST: 19 U/L (ref 15–37)
Albumin/Globulin Ratio: 0.6 — ABNORMAL LOW (ref 1.1–2.2)
Albumin: 2.9 g/dL — ABNORMAL LOW (ref 3.4–5.0)
Alkaline Phosphatase: 170 U/L — ABNORMAL HIGH (ref 40–129)
Anion Gap: 12 (ref 3–16)
BUN: 5 mg/dL — ABNORMAL LOW (ref 7–20)
CO2: 26 mmol/L (ref 21–32)
Calcium: 9.1 mg/dL (ref 8.3–10.6)
Chloride: 100 mmol/L (ref 99–110)
Creatinine: 0.5 mg/dL — ABNORMAL LOW (ref 0.6–1.1)
Est, Glom Filt Rate: >90
Glucose: 240 mg/dL — ABNORMAL HIGH (ref 70–99)
Potassium: 3.5 mmol/L (ref 3.5–5.1)
Sodium: 138 mmol/L (ref 136–145)
Total Bilirubin: 0.6 mg/dL (ref 0.0–1.0)
Total Protein: 7.7 g/dL (ref 6.4–8.2)

## 2024-04-08 LAB — SEDIMENTATION RATE: Sed Rate, Automated: 71 mm/h — ABNORMAL HIGH (ref 0–20)

## 2024-04-08 LAB — C-REACTIVE PROTEIN: CRP: 216 mg/L — ABNORMAL HIGH (ref 0.0–5.1)

## 2024-04-08 LAB — CULTURE, BLOOD 1: Blood Culture, Routine: NO GROWTH

## 2024-04-08 LAB — CK: Total CK: 64 U/L (ref 26–192)

## 2024-04-08 MED ORDER — DAPTOMYCIN 500 MG IV SOLR
500 | Freq: Once | INTRAVENOUS | Status: AC
Start: 2024-04-08 — End: 2024-04-08
  Administered 2024-04-08: 15:00:00 400 mg via INTRAVENOUS

## 2024-04-08 MED FILL — DAPTOMYCIN 500 MG IV SOLR: 500 mg | INTRAVENOUS | Qty: 8 | Fill #0

## 2024-04-08 NOTE — Progress Notes (Signed)
"  Pt seen and assessed at [facility] today for daptomycin  infusion per orders from Dr. He. Infused per [facility] policy.  Monitoring completed for infusion reactions - see flowsheet.  Pt tolerated infusion well and without incident.  Pt verbalizes understanding of discharge instructions.  Discharged with walker to home with caregiver.   "

## 2024-04-09 ENCOUNTER — Inpatient Hospital Stay: Admit: 2024-04-09 | Discharge: 2024-04-09 | Primary: Oncology

## 2024-04-09 ENCOUNTER — Encounter

## 2024-04-09 VITALS — BP 135/86 | HR 106 | Temp 98.90000°F | Resp 16

## 2024-04-09 DIAGNOSIS — B9561 Methicillin susceptible Staphylococcus aureus infection as the cause of diseases classified elsewhere: Secondary | ICD-10-CM

## 2024-04-09 DIAGNOSIS — R7881 Bacteremia: Principal | ICD-10-CM

## 2024-04-09 MED ORDER — SODIUM CHLORIDE 0.9 % IV SOLN
0.9 | Freq: Once | INTRAVENOUS | Status: AC
Start: 2024-04-09 — End: 2024-04-09
  Administered 2024-04-09: 16:00:00 400 mg via INTRAVENOUS

## 2024-04-09 MED ORDER — DAPTOMYCIN (CUBICIN) INFUSION (OUTPATIENT ONLY)
0 refills | Status: DC
Start: 2024-04-09 — End: 2024-05-03

## 2024-04-09 MED ORDER — EPINEPHRINE (ANAPHYLAXIS) 1 MG/ML IJ SOLN
1 | INTRAMUSCULAR | Status: DC | PRN
Start: 2024-04-09 — End: 2024-04-10

## 2024-04-09 MED ORDER — SODIUM CHLORIDE (PF) 0.9 % IJ SOLN
0.9 | Freq: Once | INTRAMUSCULAR | Status: DC | PRN
Start: 2024-04-09 — End: 2024-04-10

## 2024-04-09 MED ORDER — ONDANSETRON HCL 4 MG/2ML IJ SOLN
4 | Freq: Once | INTRAMUSCULAR | Status: DC | PRN
Start: 2024-04-09 — End: 2024-04-10

## 2024-04-09 MED ORDER — SODIUM CHLORIDE 0.9 % IV SOLN
0.9 | INTRAVENOUS | Status: DC | PRN
Start: 2024-04-09 — End: 2024-04-10

## 2024-04-09 MED ORDER — ACETAMINOPHEN 325 MG PO TABS
325 | Freq: Once | ORAL | Status: DC | PRN
Start: 2024-04-09 — End: 2024-04-10

## 2024-04-09 MED ORDER — HYDROCORTISONE SOD SUC (PF) 100 MG IJ SOLR (ACT-0-VIAL)
100 | Freq: Once | INTRAMUSCULAR | Status: DC | PRN
Start: 2024-04-09 — End: 2024-04-10

## 2024-04-09 MED ORDER — DIPHENHYDRAMINE HCL 50 MG/ML IJ SOLN
50 | Freq: Once | INTRAMUSCULAR | Status: DC | PRN
Start: 2024-04-09 — End: 2024-04-10

## 2024-04-09 MED FILL — DAPTOMYCIN 500 MG IV SOLR: 500 mg | INTRAVENOUS | Qty: 8 | Fill #0

## 2024-04-09 NOTE — Telephone Encounter (Signed)
"  Spoke with Rexene at [employer] and she stated that patient will be going to [facility] daily for infusion and weekly labs and dressing changes.  Patient will have new insurance after the 1st and will need to reevaluate IV abx infusions at that time.  Rexene stated she would call office and advise of status.  She verbalized understanding.  "

## 2024-04-09 NOTE — Progress Notes (Signed)
"  Pt seen and assessed at [facility] today for DAPTOmycin  infusion per orders from Dr. He, Redmond, MD.  Infused per [facility] policy.  Monitoring completed for infusion reactions - see flowsheet.  Pt tolerated infusion well and without incident.  Pt verbalizes understanding of discharge instructions.  Discharged via rolling walker accompanied by husband.    "

## 2024-04-10 ENCOUNTER — Inpatient Hospital Stay: Admit: 2024-04-10 | Discharge: 2024-04-10 | Primary: Oncology

## 2024-04-10 VITALS — BP 128/75 | HR 108 | Temp 98.10000°F | Resp 18

## 2024-04-10 DIAGNOSIS — R7881 Bacteremia: Principal | ICD-10-CM

## 2024-04-10 MED ORDER — DAPTOMYCIN 500 MG IV SOLR
500 | Freq: Once | INTRAVENOUS | Status: AC
Start: 2024-04-10 — End: 2024-04-10
  Administered 2024-04-10: 17:00:00 400 mg via INTRAVENOUS

## 2024-04-10 MED FILL — DAPTOMYCIN 500 MG IV SOLR: 500 mg | INTRAVENOUS | Qty: 8 | Fill #0

## 2024-04-10 NOTE — Progress Notes (Signed)
"  Pt seen and assessed at [facility] today for DAPTOmycin  infusion per orders from Dr. He, Redmond, MD.  Infused per [facility] policy.  Monitoring completed for infusion reactions. Pt tolerated infusion well and without incident.  Pt verbalizes understanding of discharge instructions.  Discharged via rolling walker accompanied by husband.    "

## 2024-04-11 ENCOUNTER — Inpatient Hospital Stay: Admit: 2024-04-11 | Discharge: 2024-04-11 | Primary: Oncology

## 2024-04-11 VITALS — BP 128/85 | HR 96 | Temp 98.30000°F | Resp 18

## 2024-04-11 DIAGNOSIS — R7881 Bacteremia: Principal | ICD-10-CM

## 2024-04-11 MED ORDER — DAPTOMYCIN 500 MG IV SOLR
500 | Freq: Once | INTRAVENOUS | Status: AC
Start: 2024-04-11 — End: 2024-04-11
  Administered 2024-04-11: 16:00:00 400 mg via INTRAVENOUS

## 2024-04-11 MED FILL — DAPTOMYCIN 500 MG IV SOLR: 500 mg | INTRAVENOUS | Qty: 8 | Fill #0

## 2024-04-11 NOTE — Progress Notes (Signed)
"  Contacted Pharm at [phone] via [facility] inquiring about medication ETA.    Call made to pharmacy regarding medication at 1020. Medication still being prepared. Pt made aware. WCTM  "

## 2024-04-11 NOTE — Progress Notes (Signed)
"  Pt states she is to return tomorrow 04/12/2024 for Daptomycin  IVPB. Pt made aware PICC line drsg change is due tomorrow. Pt verbalized understanding.  "

## 2024-04-11 NOTE — Progress Notes (Signed)
"  Pt seen and assessed at [facility] today for DAPTOmycin  infusion per orders from Dr. He, Redmond, MD.  Infused per [facility] policy.  Monitoring completed for infusion reactions. Pt tolerated infusion well and without incident.  Pt verbalizes understanding of discharge instructions.  Discharged via rolling walker accompanied by husband.    "

## 2024-04-12 ENCOUNTER — Inpatient Hospital Stay: Admit: 2024-04-12 | Discharge: 2024-04-12 | Primary: Oncology

## 2024-04-12 VITALS — BP 138/85 | HR 101 | Temp 98.60000°F | Resp 16

## 2024-04-12 DIAGNOSIS — R7881 Bacteremia: Secondary | ICD-10-CM

## 2024-04-12 MED ORDER — DAPTOMYCIN 500 MG IV SOLR
500 | Freq: Once | INTRAVENOUS | Status: AC
Start: 2024-04-12 — End: 2024-04-12
  Administered 2024-04-12: 16:00:00 400 mg via INTRAVENOUS

## 2024-04-12 MED FILL — DAPTOMYCIN 500 MG IV SOLR: 500 mg | INTRAVENOUS | Qty: 8 | Fill #0

## 2024-04-12 NOTE — Progress Notes (Signed)
 Pt seen and assessed at [facility] today for line care needs. CVC site remains free of signs/symptoms of infection. No drainage, edema, erythema, pain, or warmth noted at site. Line care performed per flowsheet.  CVC need continues d/t ongoing outpt therapy.

## 2024-04-12 NOTE — Progress Notes (Signed)
"  Pt seen and assessed at [facility] today for DAPTOmycin  infusion per orders from Dr. He, Redmond, MD.  Infused per [facility] policy.  Monitoring completed for infusion reactions. Pt tolerated infusion well and without incident.  Pt verbalizes understanding of discharge instructions.  Discharged via rolling walker accompanied by husband.    "

## 2024-04-12 NOTE — Telephone Encounter (Signed)
"  OPAT Nurse Coordinator Weekly Update Note    Current OPAT plan:   IV daptomycin  400 mg daily  - Planned End date: 04/30/2024    Diagnosis:  MSSA bacteremia, left hip septic bursitis     Assessment:  Spoke with patient who reports she is doing well.    Tolerating IV abx without issues.  Denies N/V/D/fevers    Left hip sutures in place.  C/d/I, no drainage noted.     Received insurance card -will be effective 1/1. Would like to remain getting infusions daily at the [facility] as long as there isn't a high copay.  Per [facility], they received a copy of her insurance card and will have the registrar upload it tomorrow.      Follow up appts:  Ortho 1/13      "

## 2024-04-13 ENCOUNTER — Inpatient Hospital Stay: Admit: 2024-04-13 | Discharge: 2024-04-13 | Payer: BLUE CROSS/BLUE SHIELD | Primary: Oncology

## 2024-04-13 VITALS — BP 148/89 | HR 99 | Temp 98.40000°F | Resp 16

## 2024-04-13 DIAGNOSIS — R7881 Bacteremia: Principal | ICD-10-CM

## 2024-04-13 MED ORDER — DAPTOMYCIN 500 MG IV SOLR
500 | Freq: Once | INTRAVENOUS | Status: AC
Start: 2024-04-13 — End: 2024-04-13
  Administered 2024-04-13: 16:00:00 400 mg via INTRAVENOUS

## 2024-04-13 MED FILL — DAPTOMYCIN 500 MG IV SOLR: 500 mg | INTRAVENOUS | Qty: 8 | Fill #0

## 2024-04-13 NOTE — Progress Notes (Signed)
 Chief Complaint:  Post-Operative Exam of the Left Hip (I + D 04/02/24)      History of Present of Illness: The patient returns for the initial postop visit following treatment in the hospital for her left hip abscess in the region of the greater trochanteric bursa.  She was found to have methicillin sensitive Staph aureus and has been receiving daptomycin  with outpatient infusions.  She reports overall doing fairly well but still using a walker.    Review of Systems  Pertinent items are noted in HPI  Denies fever, chills, confusion, bowel/bladder active change.  Review of systems reviewed from Patient History Form and available in the patient's chart under the Media tab.     Examination:  On exam today her wound is healing very nicely and there is no dehiscence, warmth, erythema, or drainage.  She is able to fully bear weight but still is using a walker for support and comfort.  There are no signs of DVT.    Radiology:     X-rays obtained and reviewed by me in office:  Views    Location    Impression      No orders of the defined types were placed in this encounter.      Impression:  Encounter Diagnosis   Name Primary?    Abscess of bursa of left hip Yes         Treatment Plan:  Today we will go ahead and remove the sutures and apply Steri-Strips.  We talked about wound care and bathing and allowing the wound to see soap and water  and to shower.    We talked about the expectation that this area of the hip will overall improve with comfort and mobility with time.  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 as her comfort level allows in the near future.    We will be happy to see her back on an as-needed basis moving forward.    Please note that this transcription was created using voice recognition software.  Any errors are unintentional and may be due to voice recognition transcription.      DME Summary            No orders found for display

## 2024-04-13 NOTE — Progress Notes (Signed)
"  Pt seen and assessed at [facility] today for DAPTOmycin  infusion per orders from Dr. He, Redmond, MD.  Infused per [facility] policy.  Monitoring completed for infusion reactions. Pt tolerated infusion well and without incident.  Pt verbalizes understanding of discharge instructions.  Discharged via rolling walker accompanied by husband.    "

## 2024-04-14 ENCOUNTER — Inpatient Hospital Stay: Admit: 2024-04-14 | Discharge: 2024-04-14 | Payer: BLUE CROSS/BLUE SHIELD | Primary: Oncology

## 2024-04-14 VITALS — BP 158/99 | HR 104 | Temp 98.30000°F | Resp 16

## 2024-04-14 DIAGNOSIS — R7881 Bacteremia: Principal | ICD-10-CM

## 2024-04-14 MED ORDER — DAPTOMYCIN 500 MG IV SOLR
500 | Freq: Once | INTRAVENOUS | Status: AC
Start: 2024-04-14 — End: 2024-04-14
  Administered 2024-04-14: 15:00:00 400 mg via INTRAVENOUS

## 2024-04-14 MED FILL — DAPTOMYCIN 500 MG IV SOLR: 500 mg | INTRAVENOUS | Qty: 8 | Fill #0

## 2024-04-14 NOTE — Progress Notes (Signed)
"  Pt seen and assessed at [facility] today for DAPTOmycin  infusion per orders from Dr. He, Redmond, MD.  Infused per [facility] policy.  Monitoring completed for infusion reactions. Pt tolerated infusion well and without incident.  Pt verbalizes understanding of discharge instructions.  Discharged via rolling walker accompanied by husband.    "

## 2024-04-16 ENCOUNTER — Inpatient Hospital Stay: Payer: BLUE CROSS/BLUE SHIELD | Primary: Oncology

## 2024-04-16 NOTE — Telephone Encounter (Signed)
"  Received message from [facility] and patient stating patient is unable to afford to stay with [facility] with new insurance d/t cost of copay.    Message sent to Ty, Liaison for [employer] to run benefits/charity.       "

## 2024-04-16 NOTE — Telephone Encounter (Signed)
"  Spoke with Ty at [employer].  Patient agreeable to home infusions with labs and drsg changes at [facility]    Orders called into Rosa at [employer]:     IV daptomycin  400 mg daily  - Planned End date: 04/30/2024  CBC w diff, CMP, ESR, CRP, CK      Spoke with Education Officer, Community at [facility].  She will call patient to schedule labs/ drsg change appt.      "

## 2024-04-19 ENCOUNTER — Encounter: Primary: Oncology

## 2024-04-20 ENCOUNTER — Encounter: Payer: BLUE CROSS/BLUE SHIELD | Primary: Oncology

## 2024-04-20 ENCOUNTER — Encounter: Primary: Oncology

## 2024-04-20 ENCOUNTER — Ambulatory Visit: Admit: 2024-04-20 | Discharge: 2024-04-20 | Payer: BLUE CROSS/BLUE SHIELD | Attending: Oncology | Primary: Oncology

## 2024-04-20 VITALS — BP 136/70 | HR 116 | Temp 97.10000°F | Resp 16 | Wt 170.8 lb

## 2024-04-20 DIAGNOSIS — E039 Hypothyroidism, unspecified: Principal | ICD-10-CM

## 2024-04-20 MED ORDER — FREESTYLE LIBRE 3 PLUS SENSOR MISC
1 refills | Status: AC
Start: 2024-04-20 — End: ?

## 2024-04-20 MED ORDER — CETIRIZINE HCL 10 MG PO TABS
10 | ORAL_TABLET | Freq: Every day | ORAL | 1 refills | 30.00000 days | Status: AC
Start: 2024-04-20 — End: ?

## 2024-04-20 MED ORDER — FERROUS SULFATE 325 (65 FE) MG PO TABS
325 | ORAL_TABLET | Freq: Every day | ORAL | 1 refills | 30.00000 days | Status: AC
Start: 2024-04-20 — End: ?

## 2024-04-20 MED ORDER — MONTELUKAST SODIUM 10 MG PO TABS
10 | ORAL_TABLET | Freq: Every day | ORAL | 1 refills | 90.00000 days | Status: AC
Start: 2024-04-20 — End: ?

## 2024-04-20 MED ORDER — ALBUTEROL SULFATE HFA 108 (90 BASE) MCG/ACT IN AERS
108 | Freq: Four times a day (QID) | RESPIRATORY_TRACT | 5 refills | 25.00000 days | Status: AC | PRN
Start: 2024-04-20 — End: ?

## 2024-04-20 MED ORDER — LOSARTAN POTASSIUM 25 MG PO TABS
25 | ORAL_TABLET | ORAL | 1 refills | 90.00000 days | Status: AC
Start: 2024-04-20 — End: ?

## 2024-04-20 MED ORDER — SIMVASTATIN 40 MG PO TABS
40 | ORAL_TABLET | Freq: Every day | ORAL | 1 refills | 90.00000 days | Status: AC
Start: 2024-04-20 — End: ?

## 2024-04-20 MED ORDER — METFORMIN HCL ER 500 MG PO TB24
500 | ORAL_TABLET | Freq: Every day | ORAL | 1 refills | 90.00000 days | Status: AC
Start: 2024-04-20 — End: ?

## 2024-04-20 MED ORDER — OZEMPIC (1 MG/DOSE) 4 MG/3ML SC SOPN
4 | SUBCUTANEOUS | 1 refills | 28.00000 days | Status: AC
Start: 2024-04-20 — End: ?

## 2024-04-20 MED ORDER — LEVOTHYROXINE SODIUM 25 MCG PO TABS
25 | ORAL_TABLET | Freq: Every day | ORAL | 3 refills | 30.00000 days | Status: AC
Start: 2024-04-20 — End: ?

## 2024-04-20 MED ORDER — VITAMIN D3 50 MCG (2000 UT) PO TABS
50 | ORAL_TABLET | ORAL | 3 refills | 30.00000 days | Status: AC
Start: 2024-04-20 — End: ?

## 2024-04-20 NOTE — Assessment & Plan Note (Addendum)
 Chronic, at goal (stable), continue albuterol as needed.  Refill sent

## 2024-04-20 NOTE — Assessment & Plan Note (Signed)
 PICC line in place.  Doing well with daptomycin  daily.  Home care in place for dressing changes and blood draws.  Last infusion scheduled for 1/16.  Following with infectious disease

## 2024-04-20 NOTE — Assessment & Plan Note (Deleted)
" {  A/P Summary:[id number]}         "

## 2024-04-20 NOTE — Progress Notes (Signed)
 Post-Discharge Transitional Care  Follow Up      Shelly Richard   Date of Birth: 06-Jul-1985    Date of Office Visit: 04/20/2024  Date of Hospital Admission: 03/26/24  Date of Hospital Discharge: 04/07/24  Risk of hospital readmission (high >=14%. Medium >=10%): Readmission Risk Score: 10.9      Care management risk score Rising risk (score 2-5) and Complex Care (Scores >=6): No Risk Score On File     Non face to face following discharge, date last encounter closed (first attempt may have been earlier): *No documented post hospital discharge outreach found in the last 14 days    Call initiated 2 business days of discharge: *No response recorded in the last 14 days    ASSESSMENT/PLAN:   Assessment & Plan  Assessment & Plan  Hypothyroidism, unspecified type   Chronic, not at goal (unstable), TSH low during admission.  Continue 25 mcg levothyroxine .  Recheck TSH in 6 weeks  Mild intermittent asthma without complication   Chronic, at goal (stable), continue albuterol  as needed.  Refill sent  Type 2 diabetes mellitus with ketoacidosis without coma, with long-term current use of insulin  (HCC)   Chronic, not at goal (unstable), reviewed recent admission for DKA.  Continue Lantus  40 mg, Humalog  10 units with meals plus sliding scale.  Continue Ozempic , metformin .  Will stop Farxiga  due to sepsis.  Will refill CGM  Allergic rhinitis, unspecified seasonality, unspecified trigger   Chronic, at goal (stable), continue Singulair  and Zyrtec .  Hospital discharge follow-up    your  MSSA bacteremia   PICC line in place.  Doing well with daptomycin  daily.  Home care in place for dressing changes and blood draws.  Last infusion scheduled for 1/16.  Following with infectious disease       Medical Decision Making: high complexity  No follow-ups on file.      SUBJECTIVE:   Inpatient course: Discharge summary reviewed- see chart.    History of Present Illness  The patient presents for a hospital follow-up.    She reports an  improvement in her condition but continues to experience pain during ambulation, particularly when ascending or descending stairs. She was initially admitted to the hospital due to a headache and neck stiffness. She was diagnosed with diabetic ketoacidosis (DKA) and bacteremia MSSA thought to be originating from the bursa in her hip. She was placed on insulin  drip and IV antibiotics. She was discharged with a PICC line in place and a prescription for daptomycin , to be administered once daily until 04/30/2024. She plans to return to work after this date.  Has home care in place for blood draws and dressing changes.    She is currently on long-acting insulin  (40 units) and short-acting insulin  (sliding scale), but has not been monitoring her blood glucose levels at home.  Prefers CGM.  Farxiga  was stopped during admission.  Admits that she is not taking her medications consistently prior to hospitalization due to lapse in insurance.    TSH was noted to be low during admission.  Levothyroxine  dosing was decreased to 25 mcg.         Patient Active Problem List   Diagnosis    Mixed hyperlipidemia    Iron  deficiency anemia    Type 2 diabetes mellitus with hyperglycemia, with long-term current use of insulin  (HCC)    Essential hypertension    Acquired hypothyroidism    Mild intermittent asthma without complication    Allergic rhinitis    Heart murmur  Uterine fibroid    Class 1 obesity due to excess calories with serious comorbidity and body mass index (BMI) of 31.0 to 31.9 in adult    Abnormal uterine bleeding    Eczema    Glaucoma suspect, bilateral    Hidradenitis axillaris    HSV-2 infection    Myopic astigmatism, bilateral    Strabismus    S/P myomectomy    DKA, type 2, not at goal [facility])    Acute nonintractable headache    MSSA bacteremia    Diabetic ketoacidosis without coma associated with type 2 diabetes mellitus (HCC)    Left hip pain    Abscess of bursa of left hip       Medications listed as ordered at the  time of discharge from hospital     Medication List            Accurate as of April 20, 2024  2:29 PM. If you have any questions, ask your nurse or doctor.                CHANGE how you take these medications      FreeStyle Libre 3 Plus Sensor Misc  Apply every 15 days  What changed:   additional instructions  Another medication with the same name was removed. Continue taking this medication, and follow the directions you see here.  Changed by: Bernice Shields, [name]            CONTINUE taking these medications      acetaminophen  500 MG tablet  Commonly known as: TYLENOL   Take 1 tablet by mouth 4 times daily as needed for Pain     [employer] Blood Glucose Meter w/Device Kit  Please provide meter approved by insurance.  Check bs qam     albuterol  sulfate HFA 108 (90 Base) MCG/ACT inhaler  Commonly known as: PROVENTIL ;VENTOLIN ;PROAIR   Inhale 2 puffs into the lungs every 6 hours as needed for Wheezing or Shortness of Breath     aspirin  81 MG EC tablet  Take 1 tablet by mouth in the morning and at bedtime     blood glucose test strips  Tes2t qam  times a day & as needed for symptoms of irregular blood glucose. Please provide test strips approved by insurance.     cetirizine  10 MG tablet  Commonly known as: ZYRTEC   Take 1 tablet by mouth daily     DAPTOmycin  infusion  Commonly known as: CUBICIN   Infuse 400 mg intravenously in the morning for 21 days. Compound per protocol.     EPINEPHrine  0.3 MG/0.3ML Soaj injection  Commonly known as: EPIPEN      ferrous sulfate  325 (65 Fe) MG tablet  Commonly known as: FeroSul  Take 1 tablet by mouth daily (with breakfast)     ibuprofen  600 MG tablet  Commonly known as: ADVIL ;MOTRIN   Take 1 tablet by mouth every 6 hours as needed for Pain     insulin  lispro (1 Unit Dial ) 100 UNIT/ML Sopn  Commonly known as: HumaLOG  KwikPen  Inject 10 Units into the skin 3 times daily (before meals) INJECT 10 UNITS UNDER THE SKIN three times WITH MEALS PLUS INJECT 2 UNITS FOR BLOOD SUGAR 150-200, 4  UNITS IF 201-250, 6 UNITS IF 251-300, 8 UNITS IF 301-350, 10 UNITS IF GREATER THAN 350.     * [employer] Pen Needles 31G 31G X 8 MM Misc  Generic drug: Insulin  Pen Needle  1 each by Does not apply route daily     *  Insulin  Pen Needle 32G X 6 MM Misc  Use four times daily and as needed     * Droplet Pen Needles 32G X 6 MM Misc  Generic drug: Insulin  Pen Needle  USE ONCE DAILY     Lancets Misc  1 each by Does not apply route daily Please provide lancets approved by insurance.     Lantus  SoloStar 100 UNIT/ML injection pen  Generic drug: insulin  glargine  Inject 40 Units into the skin nightly     levothyroxine  25 MCG tablet  Commonly known as: SYNTHROID   Take 1 tablet by mouth Daily     losartan  25 MG tablet  Commonly known as: COZAAR   TAKE ONE TABLET BY MOUTH DAILY     metFORMIN  500 MG extended release tablet  Commonly known as: GLUCOPHAGE -XR  Take 4 tablets by mouth daily (with breakfast)     montelukast  10 MG tablet  Commonly known as: SINGULAIR   Take 1 tablet by mouth daily     Ozempic  (1 MG/DOSE) 4 MG/3ML Sopn sc injection  Generic drug: Semaglutide  (1 MG/DOSE)  Inject 1 mg into the skin every 7 days     simvastatin  40 MG tablet  Commonly known as: ZOCOR   Take 1 tablet by mouth daily     Vitamin D3 50 MCG (2000 UT) Tabs  TAKE ONE TABLET BY MOUTH DAILY           * This list has 3 medication(s) that are the same as other medications prescribed for you. Read the directions carefully, and ask your doctor or other care provider to review them with you.                STOP taking these medications      dapagliflozin  10 MG tablet  Wait to take this until your doctor or other care provider tells you to start again.  Active infection.   Commonly known as: Farxiga   Stopped by: Bernice Shields, [name]     furosemide  20 MG tablet  Commonly known as: Lasix   Stopped by: Bernice Shields, [name]     letrozole 2.5 MG tablet  Commonly known as: FEMARA  Stopped by: Bernice Shields, [name]               Where to Get Your  Medications        These medications were sent to [facility] [id number] [place], [place] - [address] [name] [phone] [address] [phone]  [address] [place] [address]      Phone: [phone]   albuterol  sulfate HFA 108 (90 Base) MCG/ACT inhaler  cetirizine  10 MG tablet  ferrous sulfate  325 (65 Fe) MG tablet  FreeStyle Libre 3 Plus Sensor Misc  levothyroxine  25 MCG tablet  losartan  25 MG tablet  metFORMIN  500 MG extended release tablet  montelukast  10 MG tablet  Ozempic  (1 MG/DOSE) 4 MG/3ML Sopn sc injection  simvastatin  40 MG tablet  Vitamin D3 50 MCG (2000 UT) Tabs           Medications marked taking at this time  Outpatient Medications Marked as Taking for the 04/20/24 encounter (Office Visit) with Jassmine Vandruff E, [name]   Medication Sig Dispense Refill    levothyroxine  (SYNTHROID ) 25 MCG tablet Take 1 tablet by mouth Daily 30 tablet 3    Continuous Glucose Sensor (FREESTYLE LIBRE 3 PLUS SENSOR) MISC Apply every 15 days 6 each 1    simvastatin  (ZOCOR ) 40 MG  tablet Take 1 tablet by mouth daily 90 tablet 1    losartan  (COZAAR ) 25 MG tablet TAKE ONE TABLET BY MOUTH DAILY 90 tablet 1    Semaglutide , 1 MG/DOSE, (OZEMPIC , 1 MG/DOSE,) 4 MG/3ML SOPN sc injection Inject 1 mg into the skin every 7 days 6 mL 1    metFORMIN  (GLUCOPHAGE -XR) 500 MG extended release tablet Take 4 tablets by mouth daily (with breakfast) 360 tablet 1    ferrous sulfate  (FEROSUL) 325 (65 Fe) MG tablet Take 1 tablet by mouth daily (with breakfast) 90 tablet 1    cetirizine  (ZYRTEC ) 10 MG tablet Take 1 tablet by mouth daily 90 tablet 1    montelukast  (SINGULAIR ) 10 MG tablet Take 1 tablet by mouth daily 90 tablet 1    Cholecalciferol (VITAMIN D3) 50 MCG (2000 UT) TABS TAKE ONE TABLET BY MOUTH DAILY 90 tablet 3    albuterol  sulfate HFA (PROVENTIL ;VENTOLIN ;PROAIR ) 108 (90 Base) MCG/ACT inhaler Inhale 2 puffs into the lungs every 6 hours as needed for Wheezing or Shortness of Breath 1 each 5    DAPTOmycin  (CUBICIN )  infusion Infuse 400 mg intravenously in the morning for 21 days. Compound per protocol. 9 g 0    insulin  glargine (LANTUS  SOLOSTAR) 100 UNIT/ML injection pen Inject 40 Units into the skin nightly 12 mL 3    insulin  lispro, 1 Unit Dial , (HUMALOG  KWIKPEN) 100 UNIT/ML SOPN Inject 10 Units into the skin 3 times daily (before meals) INJECT 10 UNITS UNDER THE SKIN three times WITH MEALS PLUS INJECT 2 UNITS FOR BLOOD SUGAR 150-200, 4 UNITS IF 201-250, 6 UNITS IF 251-300, 8 UNITS IF 301-350, 10 UNITS IF GREATER THAN 350. 9 mL 3    aspirin  81 MG EC tablet Take 1 tablet by mouth in the morning and at bedtime 30 tablet 3    ibuprofen  (ADVIL ;MOTRIN ) 600 MG tablet Take 1 tablet by mouth every 6 hours as needed for Pain 120 tablet 0    acetaminophen  (TYLENOL ) 500 MG tablet Take 1 tablet by mouth 4 times daily as needed for Pain 120 tablet 0    Blood Glucose Monitoring Suppl (ACURA BLOOD GLUCOSE METER) w/Device KIT Please provide meter approved by insurance.  Check bs qam 1 kit 0        Medications patient taking as of now reconciled against medications ordered at time of hospital discharge: Yes    A comprehensive review of systems was negative except for what was noted in the HPI.    OBJECTIVE:       Vitals:    04/20/24 1241   BP: 136/70   Pulse: (!) 116   Resp: 16   Temp: 97.1 F (36.2 C)   TempSrc: Temporal   SpO2: 98%   Weight: 77.5 kg (170 lb 12.8 oz)      Body mass index is 29.32 kg/m.       Physical Exam  Constitutional:       Appearance: Normal appearance.   HENT:      Head: Normocephalic and atraumatic.   Eyes:      Extraocular Movements: Extraocular movements intact.   Pulmonary:      Effort: Pulmonary effort is normal.   Musculoskeletal:      Cervical back: Normal range of motion.   Skin:     Coloration: Skin is not jaundiced or pale.   Neurological:      General: No focal deficit present.      Mental Status: She is alert and oriented to person,  place, and time.   Psychiatric:         Mood and Affect: Mood normal.          Behavior: Behavior normal.         Thought Content: Thought content normal.            An electronic signature was used to authenticate this note.  --Bernice FORBES Shields, [name]

## 2024-04-20 NOTE — Progress Notes (Deleted)
 Shelly Richard (DOB:  [birth year]) is a 39 y.o. female,Established patient, here for evaluation of the following chief complaint(s):  Follow-Up from Hospital (From [facility] on 03/26/2024)      ASSESSMENT/PLAN:  Assessment & Plan  Hypothyroidism, unspecified type   {A/P [phone]::***}  Mild intermittent asthma without complication   {A/P [phone]::***}  Type 2 diabetes mellitus with ketoacidosis without coma, with long-term current use of insulin  (HCC)   {A/P [phone]::***}  Allergic rhinitis, unspecified seasonality, unspecified trigger   {A/P [phone]::***}       No follow-ups on file.    SUBJECTIVE/OBJECTIVE:    History of Present Illness  The patient presents for a hospital follow-up.    She reports an improvement in her condition but continues to experience pain during ambulation, particularly when ascending or descending stairs. She was initially admitted to the hospital due to a headache and neck stiffness, symptoms that had been present since 12/2023. She speculates that these symptoms may have been triggered by stress related to her mother's fall and subsequent hospitalization. Despite receiving a headache cocktail consisting of a muscle relaxer, Tylenol , and Benadryl  at [facility] during the week of Thanksgiving, her symptoms persisted. She sought medical attention again on the Sunday following Thanksgiving, where she received the same treatment. Her condition deteriorated, leading to another hospital admission. During this stay, she was diagnosed with diabetic ketoacidosis (DKA) and a blood infection originating from the bursa in her hip. She suspects that the infection may have been introduced through shaving. Her blood sugars were controlled, and she was placed on insulin  drip and IV antibiotics. She was discharged with a PICC line in place and a prescription for daptomycin , to be administered once daily until 04/30/2024. She  plans to return to work after this date. She has not scheduled a follow-up appointment with infectious disease and is uncertain about the plan for PICC line removal. A nurse was supposed to come out yesterday to change the dressing, but she canceled because she did not get the paperwork.    She is currently on long-acting insulin  (40 units) and short-acting insulin  (sliding scale), but has not been monitoring her blood glucose levels at home. She is seeking refills for her iron  supplement, Zyrtec , Singulair , albuterol  inhaler, and vitamin D. She is also requesting a refill for her continuous glucose monitor.    She was prescribed levothyroxine  50 mcg during her hospital stay, but she has not been taking it as directed. She was previously on a higher dose of this medication.    She is unsure about the status of her Farxiga  prescription. She was discharged with prescriptions for aspirin  and levothyroxine . She is also on losartan  and simvastatin  for blood pressure management. She is unsure about her Lasix  prescription. She discontinued letrozole in 2023 following fibroid removal surgery. She has no history of breast cancer. She received a Depo injection in the past.    PAST SURGICAL HISTORY:  - Fibroid removal surgery in 2023      Current Outpatient Medications   Medication Sig Dispense Refill    levothyroxine  (SYNTHROID ) 25 MCG tablet Take 1 tablet by mouth Daily 30 tablet 3    Continuous Glucose Sensor (FREESTYLE LIBRE 3 PLUS SENSOR) MISC Apply every 15 days 6 each 1    simvastatin  (ZOCOR ) 40 MG tablet Take 1 tablet by mouth daily 90 tablet 1    losartan  (COZAAR ) 25 MG tablet TAKE ONE TABLET BY MOUTH DAILY 90 tablet 1    Semaglutide , 1  MG/DOSE, (OZEMPIC , 1 MG/DOSE,) 4 MG/3ML SOPN sc injection Inject 1 mg into the skin every 7 days 6 mL 1    metFORMIN  (GLUCOPHAGE -XR) 500 MG extended release tablet Take 4 tablets by mouth daily (with breakfast) 360 tablet 1    ferrous sulfate  (FEROSUL) 325 (65 Fe) MG tablet Take 1  tablet by mouth daily (with breakfast) 90 tablet 1    cetirizine  (ZYRTEC ) 10 MG tablet Take 1 tablet by mouth daily 90 tablet 1    montelukast  (SINGULAIR ) 10 MG tablet Take 1 tablet by mouth daily 90 tablet 1    Cholecalciferol (VITAMIN D3) 50 MCG (2000 UT) TABS TAKE ONE TABLET BY MOUTH DAILY 90 tablet 3    albuterol  sulfate HFA (PROVENTIL ;VENTOLIN ;PROAIR ) 108 (90 Base) MCG/ACT inhaler Inhale 2 puffs into the lungs every 6 hours as needed for Wheezing or Shortness of Breath 1 each 5    DAPTOmycin  (CUBICIN ) infusion Infuse 400 mg intravenously in the morning for 21 days. Compound per protocol. 9 g 0    insulin  glargine (LANTUS  SOLOSTAR) 100 UNIT/ML injection pen Inject 40 Units into the skin nightly 12 mL 3    insulin  lispro, 1 Unit Dial , (HUMALOG  KWIKPEN) 100 UNIT/ML SOPN Inject 10 Units into the skin 3 times daily (before meals) INJECT 10 UNITS UNDER THE SKIN three times WITH MEALS PLUS INJECT 2 UNITS FOR BLOOD SUGAR 150-200, 4 UNITS IF 201-250, 6 UNITS IF 251-300, 8 UNITS IF 301-350, 10 UNITS IF GREATER THAN 350. 9 mL 3    aspirin  81 MG EC tablet Take 1 tablet by mouth in the morning and at bedtime 30 tablet 3    ibuprofen  (ADVIL ;MOTRIN ) 600 MG tablet Take 1 tablet by mouth every 6 hours as needed for Pain 120 tablet 0    acetaminophen  (TYLENOL ) 500 MG tablet Take 1 tablet by mouth 4 times daily as needed for Pain 120 tablet 0    Blood Glucose Monitoring Suppl (ACURA BLOOD GLUCOSE METER) w/Device KIT Please provide meter approved by insurance.  Check bs qam 1 kit 0    Insulin  Pen Needle (DROPLET PEN NEEDLES) 32G X 6 MM MISC USE ONCE DAILY (Patient not taking: Reported on 04/20/2024) 100 each 3    Insulin  Pen Needle 32G X 6 MM MISC Use four times daily and as needed (Patient not taking: Reported on 04/20/2024) 100 each 5    EPINEPHrine  (EPIPEN ) 0.3 MG/0.3ML SOAJ injection  (Patient not taking: Reported on 04/20/2024)      blood glucose monitor strips Tes2t qam  times a day & as needed for symptoms of irregular blood  glucose. Please provide test strips approved by insurance. (Patient not taking: Reported on 04/20/2024) 100 strip 2    Insulin  Pen Needle ([employer] PEN NEEDLES 31G) 31G X 8 MM MISC 1 each by Does not apply route daily (Patient not taking: Reported on 04/20/2024) 100 each 3    Lancets MISC 1 each by Does not apply route daily Please provide lancets approved by insurance. (Patient not taking: Reported on 04/20/2024) 100 each 3     No current facility-administered medications for this visit.     MEDICATION LIST REVIEWED AND UPDATED AT TIME OF VISIT       Review of Systems    Vitals:    04/20/24 1241   BP: 136/70   Pulse: (!) 116   Resp: 16   Temp: 97.1 F (36.2 C)   TempSrc: Temporal   SpO2: 98%   Weight: 77.5 kg (170 lb 12.8 oz)  Physical Exam      The patient (or guardian, if applicable) and other individuals in attendance with the patient were advised that Artificial Intelligence will be utilized during this visit to record, process the conversation to generate a clinical note, and support improvement of the AI technology. The patient (or guardian, if applicable) and other individuals in attendance at the appointment consented to the use of AI, including the recording.      {Time Documentation Optional:[id number]}    An electronic signature was used to authenticate this note.    --Bernice FORBES Shields, [name]

## 2024-04-20 NOTE — Assessment & Plan Note (Addendum)
 Chronic, at goal (stable), continue Singulair  and Zyrtec .

## 2024-04-20 NOTE — Telephone Encounter (Signed)
 OPAT Nurse Coordinator Weekly Update Note    Current OPAT plan:  IV daptomycin  400 mg daily  - Planned End date: 04/30/2024    Diagnosis:  MSSA bacteremia, left hip septic bursitis     Assessment:  Patient reports she is doing well.      Sutures removed last week from incision.      Denies drainage, fevers.      Follow up appts:

## 2024-04-21 ENCOUNTER — Encounter

## 2024-04-21 ENCOUNTER — Telehealth

## 2024-04-21 NOTE — Telephone Encounter (Signed)
 Requested Prescriptions     Pending Prescriptions Disp Refills    Continuous Glucose Sensor (FREESTYLE LIBRE 3 PLUS SENSOR) MISC 6 each 1     Sig: Apply every 15 days        Please start PA on pend medication.

## 2024-04-21 NOTE — Telephone Encounter (Signed)
 Requested Prescriptions     Pending Prescriptions Disp Refills    Semaglutide , 1 MG/DOSE, (OZEMPIC , 1 MG/DOSE,) 4 MG/3ML SOPN sc injection 6 mL 1     Sig: Inject 1 mg into the skin every 7 days        Please start PA on pend medication.

## 2024-04-22 ENCOUNTER — Inpatient Hospital Stay: Payer: BLUE CROSS/BLUE SHIELD | Primary: Oncology

## 2024-04-22 DIAGNOSIS — R7881 Bacteremia: Principal | ICD-10-CM

## 2024-04-23 LAB — CBC WITH AUTO DIFFERENTIAL
Basophils %: 0.9 %
Basophils Absolute: 0.1 K/uL (ref 0.0–0.2)
Eosinophils %: 1.6 %
Eosinophils Absolute: 0.1 K/uL (ref 0.0–0.6)
Hematocrit: 32.3 % — ABNORMAL LOW (ref 36.0–48.0)
Hemoglobin: 10.5 g/dL — ABNORMAL LOW (ref 12.0–16.0)
Lymphocytes %: 18.4 %
Lymphocytes Absolute: 1.6 K/uL (ref 1.0–5.1)
MCH: 26.9 pg (ref 26.0–34.0)
MCHC: 32.4 g/dL (ref 31.0–36.0)
MCV: 83.2 fL (ref 80.0–100.0)
MPV: 8.5 fL (ref 5.0–10.5)
Monocytes %: 5.8 %
Monocytes Absolute: 0.5 K/uL (ref 0.0–1.3)
Neutrophils %: 73.3 %
Neutrophils Absolute: 6.5 K/uL (ref 1.7–7.7)
Platelets: 392 K/uL (ref 135–450)
RBC: 3.89 M/uL — ABNORMAL LOW (ref 4.00–5.20)
RDW: 17.8 % — ABNORMAL HIGH (ref 12.4–15.4)
WBC: 8.8 K/uL (ref 4.0–11.0)

## 2024-04-23 LAB — COMPREHENSIVE METABOLIC PANEL
ALT: 8 U/L — ABNORMAL LOW (ref 10–40)
AST: 15 U/L (ref 15–37)
Albumin/Globulin Ratio: 0.7 — ABNORMAL LOW (ref 1.1–2.2)
Albumin: 3.3 g/dL — ABNORMAL LOW (ref 3.4–5.0)
Alkaline Phosphatase: 162 U/L — ABNORMAL HIGH (ref 40–129)
Anion Gap: 12 (ref 3–16)
BUN: 7 mg/dL (ref 7–20)
CO2: 27 mmol/L (ref 21–32)
Calcium: 9.3 mg/dL (ref 8.3–10.6)
Chloride: 98 mmol/L — ABNORMAL LOW (ref 99–110)
Creatinine: 0.6 mg/dL (ref 0.6–1.1)
Est, Glom Filt Rate: >90
Glucose: 317 mg/dL — ABNORMAL HIGH (ref 70–99)
Potassium: 3.9 mmol/L (ref 3.5–5.1)
Sodium: 137 mmol/L (ref 136–145)
Total Bilirubin: 0.5 mg/dL (ref 0.0–1.0)
Total Protein: 7.8 g/dL (ref 6.4–8.2)

## 2024-04-23 LAB — C-REACTIVE PROTEIN: CRP: 51.9 mg/L — ABNORMAL HIGH (ref 0.0–5.1)

## 2024-04-23 LAB — CK: Total CK: 251 U/L — ABNORMAL HIGH (ref 26–192)

## 2024-04-23 LAB — SEDIMENTATION RATE: Sed Rate, Automated: 106 mm/h — ABNORMAL HIGH (ref 0–20)

## 2024-04-23 NOTE — Telephone Encounter (Signed)
 Missing CK.  Spoke with Recardo Judah read at [facility].  She reports they do not have an order for the CK but will call lab to see if it can be added on.  OPAT orders verified.    [id number]  received call back from [place], lab will add on CK

## 2024-04-24 NOTE — Telephone Encounter (Signed)
 The medication FreeStyle Libre 3 Plus Sensor  is APPROVED from 04/24/24 to 04/24/25    Auth / Case # [id number] .    Please notify the patient.    If this requires a response please respond to the pool ( P MHCX PSC MEDICATION PRE-AUTH).

## 2024-04-24 NOTE — Telephone Encounter (Signed)
 Submitted PA for [employer] 3 Plus Sensor   Via CMM (Key: [id number]) STATUS: PENDING.    Follow up done daily.      If no response has been received by the sixth day, we will contact the insurance company for a status update.

## 2024-04-24 NOTE — Telephone Encounter (Signed)
 The medication Ozempic  (1 MG/DOSE) 4MG /3ML pen-injectors is APPROVED from 04/24/2024 to 04/24/2025.    Auth / Case # [id number] .    Please notify the patient.    If this requires a response please respond to the pool ( P MHCX PSC MEDICATION PRE-AUTH).

## 2024-04-24 NOTE — Telephone Encounter (Signed)
 Submitted PA for Ozempic  (1 MG/DOSE) 4MG /3ML pen-injectors  Via CMM (Key: [id number]) STATUS: PENDING.    Follow up done daily.      If no response has been received by the sixth day, we will contact the insurance company for a status update.

## 2024-04-26 NOTE — Telephone Encounter (Signed)
 Pt was advised on medication

## 2024-04-27 ENCOUNTER — Encounter: Payer: BLUE CROSS/BLUE SHIELD | Primary: Oncology

## 2024-04-27 ENCOUNTER — Telehealth

## 2024-04-27 DIAGNOSIS — M71052 Abscess of bursa, left hip: Principal | ICD-10-CM

## 2024-04-27 MED ORDER — FLUCONAZOLE 150 MG PO TABS
150 | ORAL_TABLET | Freq: Once | ORAL | 0 refills | 5.00000 days | Status: DC
Start: 2024-04-27 — End: 2024-04-30

## 2024-04-27 NOTE — Telephone Encounter (Signed)
 OPAT Nurse Coordinator Weekly Update Note    Current OPAT plan:  IV daptomycin  400 mg daily  Planned End date: 04/30/2024    Diagnosis:  MSSA bacteremia, left hip septic bursitis     Assessment:  Spoke with patient who reports she is doing ok.  Reports that she still has some pain in her hip    Incision is healed. Denies drainage, redness, swelling, fevers    HHC RN coming today.    Does have a yeast infection from the IV abx-PCP gave her RX but she reports she may need a 2nd dose.  Advised to reach out to PCP      Follow up appts:

## 2024-04-27 NOTE — Addendum Note (Signed)
 Addended by: Caitlynne Harbeck E on: 04/27/2024 08:15 AM     Modules accepted: Orders

## 2024-04-28 ENCOUNTER — Inpatient Hospital Stay: Payer: BLUE CROSS/BLUE SHIELD | Primary: Oncology

## 2024-04-28 LAB — CBC WITH AUTO DIFFERENTIAL
Basophils %: 0.9 %
Basophils Absolute: 0.1 K/uL (ref 0.0–0.2)
Eosinophils %: 2.3 %
Eosinophils Absolute: 0.2 K/uL (ref 0.0–0.6)
Hematocrit: 34 % — ABNORMAL LOW (ref 36.0–48.0)
Hemoglobin: 11 g/dL — ABNORMAL LOW (ref 12.0–16.0)
Lymphocytes %: 19 %
Lymphocytes Absolute: 1.8 K/uL (ref 1.0–5.1)
MCH: 26.8 pg (ref 26.0–34.0)
MCHC: 32.3 g/dL (ref 31.0–36.0)
MCV: 83.1 fL (ref 80.0–100.0)
MPV: 8.9 fL (ref 5.0–10.5)
Monocytes %: 6.7 %
Monocytes Absolute: 0.6 K/uL (ref 0.0–1.3)
Neutrophils %: 71.1 %
Neutrophils Absolute: 6.9 K/uL (ref 1.7–7.7)
Platelets: 375 K/uL (ref 135–450)
RBC: 4.09 M/uL (ref 4.00–5.20)
RDW: 17.7 % — ABNORMAL HIGH (ref 12.4–15.4)
WBC: 9.7 K/uL (ref 4.0–11.0)

## 2024-04-28 LAB — COMPREHENSIVE METABOLIC PANEL
ALT: 9 U/L — ABNORMAL LOW (ref 10–40)
AST: 12 U/L — ABNORMAL LOW (ref 15–37)
Albumin/Globulin Ratio: 0.8 — ABNORMAL LOW (ref 1.1–2.2)
Albumin: 3.6 g/dL (ref 3.4–5.0)
Alkaline Phosphatase: 173 U/L — ABNORMAL HIGH (ref 40–129)
Anion Gap: 12 (ref 3–16)
BUN: 7 mg/dL (ref 7–20)
CO2: 26 mmol/L (ref 21–32)
Calcium: 9.8 mg/dL (ref 8.3–10.6)
Chloride: 98 mmol/L — ABNORMAL LOW (ref 99–110)
Creatinine: 0.6 mg/dL (ref 0.6–1.1)
Est, Glom Filt Rate: >90
Glucose: 291 mg/dL — ABNORMAL HIGH (ref 70–99)
Potassium: 4 mmol/L (ref 3.5–5.1)
Sodium: 136 mmol/L (ref 136–145)
Total Bilirubin: 0.6 mg/dL (ref 0.0–1.0)
Total Protein: 8.3 g/dL — ABNORMAL HIGH (ref 6.4–8.2)

## 2024-04-28 LAB — SEDIMENTATION RATE: Sed Rate, Automated: 85 mm/h — ABNORMAL HIGH (ref 0–20)

## 2024-04-28 LAB — C-REACTIVE PROTEIN: CRP: 73.1 mg/L — ABNORMAL HIGH (ref 0.0–5.1)

## 2024-04-28 NOTE — Telephone Encounter (Signed)
 Reviewed ortho note from 12/30:    History of Present of Illness: The patient returns for the initial postop visit following treatment in the hospital for her left hip abscess in the region of the greater trochanteric bursa. She was found to have methicillin sensitive Staph aureus and has been receiving daptomycin  with outpatient infusions. She reports overall doing fairly well but still using a walker.    Review of Systems  Pertinent items are noted in HPI  Denies fever, chills, confusion, bowel/bladder active change.  Review of systems reviewed from Patient History Form and available in the patient's chart under the Media tab.     Examination:  On exam today her wound is healing very nicely and there is no dehiscence, warmth, erythema, or drainage. She is able to fully bear weight but still is using a walker for support and comfort. There are no signs of DVT.    Radiology:   X-rays obtained and reviewed by me in office:  Views   Location   Impression     No orders of the defined types were placed in this encounter.    Impression:  Encounter Diagnosis   Name Primary?   Abscess of bursa of left hip Yes     Treatment Plan:  Today we will go ahead and remove the sutures and apply Steri-Strips. We talked about wound care and bathing and allowing the wound to see soap and water  and to shower.    We talked about the expectation that this area of the hip will overall improve with comfort and mobility with time. 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 as her comfort level allows in the near future.    We will be happy to see her back on an as-needed basis moving forward.

## 2024-04-28 NOTE — Addendum Note (Signed)
 Addended by: STEPHEN MILLMAN on: 04/28/2024 12:02 PM     Modules accepted: Orders

## 2024-04-28 NOTE — Telephone Encounter (Signed)
 Patient with persistently elevated CRP and still with some pain in hip.  Repeat MRI. Order entered.   Let's extend dapto until 1/30 to facilitate this. Might be able to end earlier if we get favorable results.

## 2024-04-28 NOTE — Telephone Encounter (Signed)
 Spoke with patient and advised of pharmacist note:    Patient with persistently elevated CRP and still with some pain in hip.  Repeat MRI. Order entered.   Let's extend dapto until 1/30 to facilitate this. Might be able to end earlier if we get favorable results.     Patient was planning on going back to work, however, cannot return with a PICC line in and is hesitant to extend her IV abx. Would like to think about it and let me know.    Number given to central scheduling to schedule repeat MRI.

## 2024-04-29 ENCOUNTER — Encounter

## 2024-04-30 MED ORDER — FLUCONAZOLE 150 MG PO TABS
150 | ORAL_TABLET | ORAL | 0 refills | 5.00000 days | Status: DC
Start: 2024-04-30 — End: 2024-05-11

## 2024-04-30 NOTE — Addendum Note (Signed)
 Addended by: STEPHEN MILLMAN on: 04/30/2024 02:21 PM     Modules accepted: Orders

## 2024-04-30 NOTE — Telephone Encounter (Signed)
 Dapto doesn't usually cause yeast infections but I sent a PO fluconazole  rx. If persists, will need to be seen for work up.

## 2024-04-30 NOTE — Telephone Encounter (Signed)
 Spoke with patient and advised of pharmacist note.  She v.u

## 2024-04-30 NOTE — Telephone Encounter (Signed)
 Spoke with patient who has scheduled her MRI for 1/22,  she does want to extend her IV abx until we get her results back but end early if everything looks ok.    She reports that she was treated by her PCP for a yeast infection, however, it is not clearing up and is asking for another RX.    Spoke with Franky at [facility] and advised of pharmacist note to extent IV abx until 1/30.  He v/u    Spoke with Writer at [facility] and advised of above.  She v/u

## 2024-05-03 ENCOUNTER — Inpatient Hospital Stay
Admission: EM | Admit: 2024-05-03 | Discharge: 2024-05-11 | Disposition: A | Discharge status: Home / Self Care | Payer: BLUE CROSS/BLUE SHIELD | Attending: Internal Medicine | Admitting: Internal Medicine

## 2024-05-03 ENCOUNTER — Emergency Department: Admit: 2024-05-03 | Payer: BLUE CROSS/BLUE SHIELD | Primary: Oncology

## 2024-05-03 DIAGNOSIS — A419 Sepsis, unspecified organism: Principal | ICD-10-CM

## 2024-05-03 DIAGNOSIS — A4101 Sepsis due to Methicillin susceptible Staphylococcus aureus: Secondary | ICD-10-CM

## 2024-05-03 LAB — BLOOD GAS, VENOUS
Base Excess, Ven: 2.6 mmol/L (ref <=3.0)
Carboxyhemoglobin: 1.3 % (ref 0.0–1.5)
HCO3, Venous: 27 mmol/L (ref 24.0–28.0)
Hemoglobin, Ven, Reduced: 39.8 %
MetHgb, Ven: 0 % (ref 0.0–1.5)
O2 Sat, Ven: 60 %
PO2, Ven: 31.2 mmHg (ref 25.0–40.0)
TC02 (Calc), Ven: 28 mmol/L
pCO2, Ven: 39.8 mmHg — ABNORMAL LOW (ref 41.0–51.0)
pH, Ven: 7.439 (ref 7.350–7.450)

## 2024-05-03 LAB — HEPATIC FUNCTION PANEL
ALT: 6 U/L — ABNORMAL LOW (ref 10–40)
AST: 11 U/L — ABNORMAL LOW (ref 15–37)
Albumin: 3.5 g/dL (ref 3.4–5.0)
Alkaline Phosphatase: 156 U/L — ABNORMAL HIGH (ref 40–129)
Bilirubin, Direct: 0.2 mg/dL (ref 0.0–0.3)
Bilirubin, Indirect: 0.4 mg/dL (ref 0.0–1.0)
Total Bilirubin: 0.6 mg/dL (ref 0.0–1.0)
Total Protein: 8.2 g/dL (ref 6.4–8.2)

## 2024-05-03 LAB — CBC WITH AUTO DIFFERENTIAL
Basophils %: 0.5 %
Basophils Absolute: 0.1 K/uL (ref 0.0–0.2)
Eosinophils %: 0.9 %
Eosinophils Absolute: 0.1 K/uL (ref 0.0–0.6)
Hematocrit: 29.8 % — ABNORMAL LOW (ref 36.0–48.0)
Hemoglobin: 10.1 g/dL — ABNORMAL LOW (ref 12.0–16.0)
Lymphocytes %: 15.8 %
Lymphocytes Absolute: 1.9 K/uL (ref 1.0–5.1)
MCH: 27 pg (ref 26.0–34.0)
MCHC: 33.9 g/dL (ref 31.0–36.0)
MCV: 79.6 fL — ABNORMAL LOW (ref 80.0–100.0)
MPV: 8.2 fL (ref 5.0–10.5)
Monocytes %: 6.9 %
Monocytes Absolute: 0.8 K/uL (ref 0.0–1.3)
Neutrophils %: 75.9 %
Neutrophils Absolute: 9.4 K/uL — ABNORMAL HIGH (ref 1.7–7.7)
Platelets: 362 K/uL (ref 135–450)
RBC: 3.75 M/uL — ABNORMAL LOW (ref 4.00–5.20)
RDW: 16.3 % — ABNORMAL HIGH (ref 12.4–15.4)
WBC: 12.3 K/uL — ABNORMAL HIGH (ref 4.0–11.0)

## 2024-05-03 LAB — BASIC METABOLIC PANEL W/ REFLEX TO MG FOR LOW K
Anion Gap: 11 (ref 3–16)
BUN: 6 mg/dL — ABNORMAL LOW (ref 7–20)
CO2: 25 mmol/L (ref 21–32)
Calcium: 9.2 mg/dL (ref 8.3–10.6)
Chloride: 101 mmol/L (ref 99–110)
Creatinine: 0.7 mg/dL (ref 0.6–1.1)
Est, Glom Filt Rate: >90
Glucose: 274 mg/dL — ABNORMAL HIGH (ref 70–99)
Potassium reflex Magnesium: 3.7 mmol/L (ref 3.5–5.1)
Sodium: 137 mmol/L (ref 136–145)

## 2024-05-03 LAB — LACTATE, SEPSIS: Lactic Acid, Sepsis: 1.4 mmol/L (ref 0.4–1.9)

## 2024-05-03 LAB — URINALYSIS WITH REFLEX TO CULTURE
Bilirubin, Urine: NEGATIVE
Glucose, Ur: 100 mg/dL — AB
Ketones, Urine: NEGATIVE mg/dL
Nitrite, Urine: NEGATIVE
Protein, UA: NEGATIVE mg/dL
Specific Gravity, UA: 1.01 (ref 1.005–1.030)
Urobilinogen, Urine: 0.2 U/dL (ref <2.0)
pH, Urine: 7 (ref 5.0–8.0)

## 2024-05-03 LAB — EKG 12-LEAD
Atrial Rate: 115 {beats}/min
P Axis: 59 degrees
P-R Interval: 146 ms
Q-T Interval: 332 ms
QRS Duration: 64 ms
QTc Calculation (Bazett): 459 ms
R Axis: 15 degrees
T Axis: 32 degrees
Ventricular Rate: 115 {beats}/min

## 2024-05-03 LAB — COVID-19 & INFLUENZA COMBO
Influenza A: NOT DETECTED
Influenza B: NOT DETECTED
SARS-CoV-2 RNA, RT PCR: NOT DETECTED

## 2024-05-03 LAB — TROPONIN: Troponin, High Sensitivity: 8 ng/L (ref 0–14)

## 2024-05-03 LAB — MICROSCOPIC URINALYSIS

## 2024-05-03 LAB — PROTIME-INR
INR: 1.34 — ABNORMAL HIGH (ref 0.86–1.14)
Protime: 16.8 s — ABNORMAL HIGH (ref 12.1–14.9)

## 2024-05-03 LAB — C-REACTIVE PROTEIN: CRP: 126 mg/L — ABNORMAL HIGH (ref 0.0–5.1)

## 2024-05-03 LAB — SEDIMENTATION RATE: Sed Rate, Automated: 94 mm/h — ABNORMAL HIGH (ref 0–20)

## 2024-05-03 MED ORDER — SODIUM CHLORIDE 0.9 % IV SOLN
0.9 | INTRAVENOUS | Status: AC
Start: 2024-05-03 — End: 2024-05-04
  Administered 2024-05-04: 02:00:00 via INTRAVENOUS

## 2024-05-03 MED ORDER — FERROUS SULFATE 325 (65 FE) MG PO TABS
325 | Freq: Every day | ORAL | Status: DC
Start: 2024-05-03 — End: 2024-05-11
  Administered 2024-05-04 – 2024-05-11 (×8): 325 mg via ORAL

## 2024-05-03 MED ORDER — NORMAL SALINE FLUSH 0.9 % IV SOLN
0.9 | Freq: Two times a day (BID) | INTRAVENOUS | Status: DC
Start: 2024-05-03 — End: 2024-05-03

## 2024-05-03 MED ORDER — ATORVASTATIN CALCIUM 20 MG PO TABS
20 | Freq: Every evening | ORAL | Status: DC
Start: 2024-05-03 — End: 2024-05-11
  Administered 2024-05-06 – 2024-05-11 (×6): 20 mg via ORAL

## 2024-05-03 MED ORDER — MAGNESIUM SULFATE 2000 MG/50 ML IVPB PREMIX
2 | INTRAVENOUS | Status: DC | PRN
Start: 2024-05-03 — End: 2024-05-11

## 2024-05-03 MED ORDER — DEXTROSE 10 % IV BOLUS
INTRAVENOUS | Status: DC | PRN
Start: 2024-05-03 — End: 2024-05-11

## 2024-05-03 MED ORDER — ENOXAPARIN SODIUM 40 MG/0.4ML IJ SOSY
40 | Freq: Every day | INTRAMUSCULAR | Status: DC
Start: 2024-05-03 — End: 2024-05-11
  Administered 2024-05-04 – 2024-05-11 (×9): 40 mg via SUBCUTANEOUS

## 2024-05-03 MED ORDER — LOSARTAN POTASSIUM 25 MG PO TABS
25 | Freq: Every day | ORAL | Status: DC
Start: 2024-05-03 — End: 2024-05-11
  Administered 2024-05-04 – 2024-05-11 (×8): 25 mg via ORAL

## 2024-05-03 MED ORDER — INSULIN GLARGINE 100 UNIT/ML SC SOLN
100 | Freq: Every evening | SUBCUTANEOUS | Status: DC
Start: 2024-05-03 — End: 2024-05-08
  Administered 2024-05-04 – 2024-05-08 (×5): 40 [IU] via SUBCUTANEOUS

## 2024-05-03 MED ORDER — INSULIN LISPRO 100 UNIT/ML IJ SOLN
100 | Freq: Four times a day (QID) | INTRAMUSCULAR | Status: DC
Start: 2024-05-03 — End: 2024-05-11
  Administered 2024-05-04 (×2): 2 [IU] via SUBCUTANEOUS
  Administered 2024-05-04: 02:00:00 4 [IU] via SUBCUTANEOUS
  Administered 2024-05-06: 17:00:00 8 [IU] via SUBCUTANEOUS
  Administered 2024-05-06 (×2): 2 [IU] via SUBCUTANEOUS
  Administered 2024-05-06 – 2024-05-07 (×2): 6 [IU] via SUBCUTANEOUS
  Administered 2024-05-07: 17:00:00 2 [IU] via SUBCUTANEOUS
  Administered 2024-05-08: 14:00:00 4 [IU] via SUBCUTANEOUS
  Administered 2024-05-08: 05:00:00 2 [IU] via SUBCUTANEOUS
  Administered 2024-05-08: 23:00:00 4 [IU] via SUBCUTANEOUS
  Administered 2024-05-08: 17:00:00 8 [IU] via SUBCUTANEOUS
  Administered 2024-05-08 – 2024-05-09 (×2): 2 [IU] via SUBCUTANEOUS
  Administered 2024-05-10: 22:00:00 4 [IU] via SUBCUTANEOUS
  Administered 2024-05-11 (×2): 2 [IU] via SUBCUTANEOUS

## 2024-05-03 MED ORDER — SODIUM CHLORIDE 0.9 % IV SOLN
0.9 | INTRAVENOUS | Status: DC | PRN
Start: 2024-05-03 — End: 2024-05-03

## 2024-05-03 MED ORDER — DAPTOMYCIN (CUBICIN) INFUSION (OUTPATIENT ONLY)
0 refills | Status: DC
Start: 2024-05-03 — End: 2024-05-11

## 2024-05-03 MED ORDER — SODIUM CHLORIDE 0.9 % IV SOLN
0.9 | INTRAVENOUS | Status: DC
Start: 2024-05-03 — End: 2024-05-05
  Administered 2024-05-04 – 2024-05-05 (×2): 500 mg via INTRAVENOUS

## 2024-05-03 MED ORDER — SODIUM CHLORIDE 0.9 % IV SOLN
0.9 | INTRAVENOUS | Status: DC | PRN
Start: 2024-05-03 — End: 2024-05-11

## 2024-05-03 MED ORDER — POTASSIUM BICARB-CITRIC ACID 20 MEQ PO TBEF
20 | ORAL | Status: AC | PRN
Start: 2024-05-03 — End: 2024-05-04

## 2024-05-03 MED ORDER — GLUCOSE 4 G PO CHEW
4 | ORAL | Status: DC | PRN
Start: 2024-05-03 — End: 2024-05-11

## 2024-05-03 MED ORDER — ACETAMINOPHEN 325 MG PO TABS
325 | Freq: Four times a day (QID) | ORAL | Status: DC | PRN
Start: 2024-05-03 — End: 2024-05-11
  Administered 2024-05-04 – 2024-05-06 (×4): 650 mg via ORAL

## 2024-05-03 MED ORDER — ONDANSETRON HCL 4 MG/2ML IJ SOLN
4 | Freq: Four times a day (QID) | INTRAMUSCULAR | Status: DC | PRN
Start: 2024-05-03 — End: 2024-05-11

## 2024-05-03 MED ORDER — NORMAL SALINE FLUSH 0.9 % IV SOLN
0.9 | Freq: Two times a day (BID) | INTRAVENOUS | Status: DC
Start: 2024-05-03 — End: 2024-05-10
  Administered 2024-05-04 – 2024-05-09 (×4): 10 mL via INTRAVENOUS

## 2024-05-03 MED ORDER — ACETAMINOPHEN 650 MG RE SUPP
650 | Freq: Four times a day (QID) | RECTAL | Status: DC | PRN
Start: 2024-05-03 — End: 2024-05-11

## 2024-05-03 MED ORDER — POTASSIUM CHLORIDE 10 MEQ/100ML IV SOLN
10 | INTRAVENOUS | Status: AC | PRN
Start: 2024-05-03 — End: 2024-05-04
  Administered 2024-05-04 (×6): 10 meq via INTRAVENOUS

## 2024-05-03 MED ORDER — POLYETHYLENE GLYCOL 3350 17 G PO PACK
17 | Freq: Every day | ORAL | Status: DC | PRN
Start: 2024-05-03 — End: 2024-05-11

## 2024-05-03 MED ORDER — NORMAL SALINE FLUSH 0.9 % IV SOLN
0.9 | INTRAVENOUS | Status: DC | PRN
Start: 2024-05-03 — End: 2024-05-11

## 2024-05-03 MED ORDER — NORMAL SALINE FLUSH 0.9 % IV SOLN
0.9 | Freq: Two times a day (BID) | INTRAVENOUS | Status: DC
Start: 2024-05-03 — End: 2024-05-10
  Administered 2024-05-04 – 2024-05-10 (×12): 10 mL via INTRAVENOUS

## 2024-05-03 MED ORDER — DEXTROSE 10 % IV SOLN
10 | INTRAVENOUS | Status: DC | PRN
Start: 2024-05-03 — End: 2024-05-11

## 2024-05-03 MED ORDER — POTASSIUM CHLORIDE CRYS ER 20 MEQ PO TBCR
20 | ORAL | Status: AC | PRN
Start: 2024-05-03 — End: 2024-05-04

## 2024-05-03 MED ORDER — LACTATED RINGERS IV BOLUS
Freq: Once | INTRAVENOUS | Status: AC
Start: 2024-05-03 — End: 2024-05-03
  Administered 2024-05-03: 19:00:00 2316 mL/kg via INTRAVENOUS

## 2024-05-03 MED ORDER — LEVOTHYROXINE SODIUM 25 MCG PO TABS
25 | Freq: Every day | ORAL | Status: DC
Start: 2024-05-03 — End: 2024-05-11
  Administered 2024-05-04 – 2024-05-11 (×8): 25 ug via ORAL

## 2024-05-03 MED ORDER — GLUCAGON HCL (DIAGNOSTIC) 1 MG IJ SOLR
1 | INTRAMUSCULAR | Status: DC | PRN
Start: 2024-05-03 — End: 2024-05-11

## 2024-05-03 MED ORDER — NORMAL SALINE FLUSH 0.9 % IV SOLN
0.9 | INTRAVENOUS | Status: DC | PRN
Start: 2024-05-03 — End: 2024-05-03

## 2024-05-03 MED ORDER — ONDANSETRON 4 MG PO TBDP
4 | Freq: Three times a day (TID) | ORAL | Status: DC | PRN
Start: 2024-05-03 — End: 2024-05-11

## 2024-05-03 NOTE — ED Notes (Signed)
 Patient Name: Shelly Richard  DOB: Feb 26, 1986 39 y.o.  MRN: [id number]  ED Room #: B22/B22-22     Chief complaint:   Chief Complaint   Patient presents with    Tachycardia     Pt presents to the ED due to tachycardia. Pt states she is being treated for a Staph infection and receives antibiotics through her PICC line.      Hospital Problem/Diagnosis:   Hospital Problems           Last Modified POA    * (Principal) Sepsis (HCC) 05/03/2024 Yes     Stroke Considerations: NIHSS:      O2 Flow Rate:O2 Device: None (Room air)   (if applicable)  Cardiac Rhythm:   (if applicable)  Active LDA's:       PICC 04/05/24 Single Lumen Right Basilic (Active)        How does patient ambulate? Low Fall Risk (Ambulates by themselves without support    2. How does patient take pills? Whole with Water     3. Is patient alert? Alert    4. Is patient oriented? To Person, To Place, To Time, To Situation, and Follows Commands    5.   Patient arrived from:  home  Facility Name: ___________________________________________    6. If patient is disoriented or from a [facility] Facility has family been notified of admission? No    7. Patient belongings? Belongings: Agricultural Engineer    Disposition of belongings? Kept with Patient     8. Any specific patient or family belongings/needs/dynamics?   a.     9. Miscellaneous comments/pending orders?  a.       If there are any additional questions please reach out to the Emergency Department.      Dyan Fitch, RN  05/03/24 1708

## 2024-05-03 NOTE — ED Provider Notes (Signed)
 [facility]  EMERGENCY DEPARTMENT ENCOUNTER          EM RESIDENT NOTE       Date of evaluation: 05/03/2024    Chief Complaint     Tachycardia (Pt presents to the ED due to tachycardia. Pt states she is being treated for a Staph infection and receives antibiotics through her PICC line. )      History of Present Illness     Shelly Richard is a 39 y.o. female PMHx of HTN, T2DM, HLD, recent admission for MSSA bacteremia and left hip septic bursitis who presents to the emergency department for tachycardia to 120s. Patient states that she has been in her normal state of health since discharge from the hospital on 04/11/2024 with PICC line and IV daptomycin . She states continued pain to her right hip but denies any worsening or new swelling. She denies any cough, congestion, fever, chills, headache, dizziness, weakness, and all other symptoms at this time. She states that her home health nurse came to her house and told her to come to the emergency department because of her tachycardia. She does report some cloudy urine for which she was prescribed fluconazole . She is unsure if the symptoms have persisted given that she is on her last menstrual period.    MEDICAL DECISION MAKING / ASSESSMENT / PLAN     INITIAL VITALS: BP: (!) 159/80, Temp: 100.2 F (37.9 C), Pulse: (!) 120, Respirations: 18, SpO2: 99 %    Shelly Richard is a 39 y.o. female PMHx of HTN, T2DM, HLD, recent admission for MSSA bacteremia and left hip septic bursitis who presents to the emergency department for sepsis of unknown origin in the setting of recent bacteremia and left hip septic bursitis. Patient was sent into the emergency department by her home health agency due to tachycardia despite IV daptomycin . On arrival, she was tachycardic to 120 and had a temperature of 100.2 Fahrenheit despite IV antibiotics. CBC revealed a leukocytosis to 12.3. No significant abnormalities on BMP. LFTs were stable. CRP and ESR are  up-trending after they had previously been downtrending in the setting of the left hip septic bursitis. Left hip x-ray without acute change. Patient was given 30 cc/kg bolus of fluids without much improvement in her tachycardia. Lactate 1.4. No signs of septic shock. VBG without acidemia. COVID/flu negative. Chest x-ray without acute abnormality. UA pending. Blood culture sent from PICC line and peripheral IV without growth in the few hours of her emergency department visit. She did have some cloudy urine for which she was treated with fluconazole  but no dysuria. Of note, she is currently on her period. She has remained stable. However, this new fever with a tachycardia in the setting of IV antibiotics for left hip bursitis and MSSA bacteremia, patient should be admitted for further workup of sepsis, unknown origin.    Is this patient to be included in the SEP-1 core measure? Yes SEP-1 CORE MEASURE DATA      Sepsis Criteria   Severe Sepsis Criteria   Septic Shock Criteria       Must meet 2:    [x] Temp >100.9 F (38.3 C) or < 96.8 F (36 C)  [x] HR > 90  [] RR > 20  [x] WBC > 12 or < 4 or 10% bands    AND:    [x]  Infection Confirmed or Suspected.     Must meet 1:    [] Lactate > 2       or   []   Signs of Organ Dysfunction:    - SBP < 90 or MAP < 65  -Creatinine > 2 or increased from baseline  -Urine Output < 0.5 ml/kg/hr  -Bilirubin > 2  -INR > 1.5 (not anticoagulated)  -Platelets < 100,000  -Acute Respiratory Failure as evidenced by new need for NIPPV or mechanical ventilation   Must meet 1:    [] Lactate > 4        or   [] SBP < 90 or MAP < 65 for at least two readings in the first hour after fluid bolus administration    [] Vasopressors initiated (if hypotension persists after fluid resuscitation)   Patient Vitals for the past 6 hrs:   BP Temp Pulse Resp SpO2   05/05/24 1437 (!) 143/82 98.4 F (36.9 C) (!) 102 16 99 %      Recent Labs     05/03/24  1417 05/04/24  0507 05/05/24  0645   WBC 12.3* 10.8 10.7   CREATININE 0.7  0.6 0.6   BILITOT 0.6 0.6  --    INR 1.34*  --   --    PLT 362 360 376        Infection likely due to bacteremia resistant to IV daptomycin .    Fluid Resuscitation Rationale: at least 70mL/kg based on entered actual weight at time of triage    Repeat lactate level: improving    Reassessment Exam: I have reassessed tissue perfusion and hemodynamic status after fluid bolus at this date/time: 16:15 on 05/03/2024    Medical Decision Making  Amount and/or Complexity of Data Reviewed  Labs: ordered.  Radiology: ordered.    Risk  Decision regarding hospitalization.        This patient was also evaluated by the attending physician. All care plans werediscussed and agreed upon.    Clinical Impression     1. Sepsis without acute organ dysfunction, due to unspecified organism (HCC)    2. MSSA bacteremia        Disposition     PATIENT REFERRED TO:  No follow-up provider specified.    DISCHARGE MEDICATIONS:  Current Discharge Medication List          DISPOSITION Admitted 05/03/2024 05:17:07 PM                 Diagnostic Results and Other Data     RADIOLOGY:  MRI HIP LEFT W WO CONTRAST   Final Result   1. Persistent fasciitis, bursitis, myositis and abscess formation along the gluteus medius, the lateral upper thigh musculature fascia, and along the greater trochanteric bursa. The abscess collection has decreased in size. The extent of inflammation    appears to be mildly improved in comparison to the prior exam.      Electronically signed by Lamar Gravely, MD      XR HIP 2-3 VW W PELVIS LEFT   Final Result   Impression: No acute osseous abnormality.      Electronically signed by Belvie Plana, MD      XR CHEST PORTABLE   Final Result   Impression: No acute cardiopulmonary abnormality.      Electronically signed by Belvie Plana, MD          LABS:   Results for orders placed or performed during the hospital encounter of 05/03/24   Culture, Blood 1    Specimen: Blood   Result Value Ref Range    Blood Culture, Routine       No  Growth to date.  Any change in status will be called.   Culture, Blood 2    Specimen: Blood   Result Value Ref Range    Culture, Blood 2 (A)      Gram stain Aerobic bottle:  Gram positive cocci in clusters  resembling Staphylococcus  Information to follow      Organism Staph aureus DNA Detected (A)     Culture, Blood 2       mecA gene not detected  See additional report for complete BCID panel.     COVID-19 & Influenza Combo    Specimen: Nasopharyngeal Swab   Result Value Ref Range    SARS-CoV-2 RNA, RT PCR NOT DETECTED NOT DETECTED    Influenza A NOT DETECTED NOT DETECTED    Influenza B NOT DETECTED NOT DETECTED   Culture, Blood, PCR ID Panel   Result Value Ref Range    Report SEE IMAGE    Lactate, Sepsis   Result Value Ref Range    Lactic Acid, Sepsis 1.4 0.4 - 1.9 mmol/L   Lactate, Sepsis   Result Value Ref Range    Lactic Acid, Sepsis 0.8 0.4 - 1.9 mmol/L   Blood gas, venous   Result Value Ref Range    pH, Ven 7.439 7.350 - 7.450    pCO2, Ven 39.8 (L) 41.0 - 51.0 mmHg    PO2, Ven 31.2 25.0 - 40.0 mmHg    HCO3, Venous 27.0 24.0 - 28.0 mmol/L    Base Excess, Ven 2.6 -2.0 - 3.0 mmol/L    O2 Sat, Ven 60 Not established %    Carboxyhemoglobin 1.3 0.0 - 1.5 %    MetHgb, Ven 0.0 0.0 - 1.5 %    TC02 (Calc), Ven 28 mmol/L    Hemoglobin, Ven, Reduced 39.80 %   CBC with Auto Differential   Result Value Ref Range    WBC 12.3 (H) 4.0 - 11.0 K/uL    RBC 3.75 (L) 4.00 - 5.20 M/uL    Hemoglobin 10.1 (L) 12.0 - 16.0 g/dL    Hematocrit 70.1 (L) 36.0 - 48.0 %    MCV 79.6 (L) 80.0 - 100.0 fL    MCH 27.0 26.0 - 34.0 pg    MCHC 33.9 31.0 - 36.0 g/dL    RDW 83.6 (H) 87.5 - 15.4 %    Platelets 362 135 - 450 K/uL    MPV 8.2 5.0 - 10.5 fL    Neutrophils % 75.9 %    Lymphocytes % 15.8 %    Monocytes % 6.9 %    Eosinophils % 0.9 %    Basophils % 0.5 %    Neutrophils Absolute 9.4 (H) 1.7 - 7.7 K/uL    Lymphocytes Absolute 1.9 1.0 - 5.1 K/uL    Monocytes Absolute 0.8 0.0 - 1.3 K/uL    Eosinophils Absolute 0.1 0.0 - 0.6 K/uL    Basophils  Absolute 0.1 0.0 - 0.2 K/uL   BMP w/ Reflex to MG   Result Value Ref Range    Sodium 137 136 - 145 mmol/L    Potassium reflex Magnesium  3.7 3.5 - 5.1 mmol/L    Chloride 101 99 - 110 mmol/L    CO2 25 21 - 32 mmol/L    Anion Gap 11 3 - 16    Glucose 274 (H) 70 - 99 mg/dL    BUN 6 (L) 7 - 20 mg/dL    Creatinine 0.7 0.6 - 1.1 mg/dL    Est, Glom Filt Rate >90  Calcium  9.2 8.3 - 10.6 mg/dL   Urinalysis with Reflex to Culture    Specimen: Urine   Result Value Ref Range    Color, UA Yellow Straw/Yellow    Clarity, UA Clear Clear    Glucose, Ur 100 (A) Negative mg/dL    Bilirubin, Urine Negative Negative    Ketones, Urine Negative Negative mg/dL    Specific Gravity, UA 1.010 1.005 - 1.030    Blood, Urine LARGE (A) Negative    pH, Urine 7.0 5.0 - 8.0    Protein, UA Negative Negative mg/dL    Urobilinogen, Urine 0.2 <2.0 E.U./dL    Nitrite, Urine Negative Negative    Leukocyte Esterase, Urine SMALL (A) Negative    Microscopic Examination YES     Urine Type NotGiven     Urine Reflex to Culture Yes    Troponin   Result Value Ref Range    Troponin, High Sensitivity 8 0 - 14 ng/L   Protime-INR   Result Value Ref Range    Protime 16.8 (H) 12.1 - 14.9 sec    INR 1.34 (H) 0.86 - 1.14   Hepatic Function Panel   Result Value Ref Range    Total Protein 8.2 6.4 - 8.2 g/dL    Albumin 3.5 3.4 - 5.0 g/dL    Alkaline Phosphatase 156 (H) 40 - 129 U/L    ALT 6 (L) 10 - 40 U/L    AST 11 (L) 15 - 37 U/L    Total Bilirubin 0.6 0.0 - 1.0 mg/dL    Bilirubin, Direct 0.2 0.0 - 0.3 mg/dL    Bilirubin, Indirect 0.4 0.0 - 1.0 mg/dL   C-Reactive Protein   Result Value Ref Range    CRP 126.0 (H) 0.0 - 5.1 mg/L   Sedimentation Rate   Result Value Ref Range    Sed Rate, Automated 94 (H) 0 - 20 mm/Hr   Microscopic Urinalysis   Result Value Ref Range    WBC, UA 10-20 (A) 0 - 5 /HPF    RBC, UA 51-100 (A) 0 - 4 /HPF    Epithelial Cells, UA 0-1 0 - 5 /HPF    Trichomonas Present (A) None Seen /HPF   Comprehensive Metabolic Panel w/ Reflex to MG   Result Value  Ref Range    Sodium 142 136 - 145 mmol/L    Potassium reflex Magnesium  2.9 (LL) 3.5 - 5.1 mmol/L    Chloride 104 99 - 110 mmol/L    CO2 26 21 - 32 mmol/L    Anion Gap 12 3 - 16    Glucose 125 (H) 70 - 99 mg/dL    BUN 5 (L) 7 - 20 mg/dL    Creatinine 0.6 0.6 - 1.1 mg/dL    Est, Glom Filt Rate >90     Calcium  9.3 8.3 - 10.6 mg/dL    Total Protein 7.9 6.4 - 8.2 g/dL    Albumin 3.2 (L) 3.4 - 5.0 g/dL    Albumin/Globulin Ratio 0.7 (L) 1.1 - 2.2    Total Bilirubin 0.6 0.0 - 1.0 mg/dL    Alkaline Phosphatase 152 (H) 40 - 129 U/L    ALT <5 (L) 10 - 40 U/L    AST 10 (L) 15 - 37 U/L   CK   Result Value Ref Range    Total CK 45 26 - 192 U/L   CBC with Auto Differential   Result Value Ref Range    WBC 10.8 4.0 - 11.0 K/uL  RBC 3.81 (L) 4.00 - 5.20 M/uL    Hemoglobin 10.2 (L) 12.0 - 16.0 g/dL    Hematocrit 69.6 (L) 36.0 - 48.0 %    MCV 79.6 (L) 80.0 - 100.0 fL    MCH 26.9 26.0 - 34.0 pg    MCHC 33.8 31.0 - 36.0 g/dL    RDW 83.6 (H) 87.5 - 15.4 %    Platelets 360 135 - 450 K/uL    MPV 8.3 5.0 - 10.5 fL    Neutrophils % 68.1 %    Lymphocytes % 21.7 %    Monocytes % 8.4 %    Eosinophils % 1.3 %    Basophils % 0.5 %    Neutrophils Absolute 7.4 1.7 - 7.7 K/uL    Lymphocytes Absolute 2.4 1.0 - 5.1 K/uL    Monocytes Absolute 0.9 0.0 - 1.3 K/uL    Eosinophils Absolute 0.1 0.0 - 0.6 K/uL    Basophils Absolute 0.1 0.0 - 0.2 K/uL   Magnesium    Result Value Ref Range    Magnesium  1.56 (L) 1.80 - 2.40 mg/dL   Hemoglobin J8R   Result Value Ref Range    Hemoglobin A1C 8.5 See comment %    Estimated Avg Glucose 197.3 mg/dL   Potassium   Result Value Ref Range    Potassium 3.9 3.5 - 5.1 mmol/L   CBC with Auto Differential   Result Value Ref Range    WBC 10.7 4.0 - 11.0 K/uL    RBC 3.56 (L) 4.00 - 5.20 M/uL    Hemoglobin 9.7 (L) 12.0 - 16.0 g/dL    Hematocrit 72.2 (L) 36.0 - 48.0 %    MCV 77.8 (L) 80.0 - 100.0 fL    MCH 27.1 26.0 - 34.0 pg    MCHC 34.9 31.0 - 36.0 g/dL    RDW 83.9 (H) 87.5 - 15.4 %    Platelets 376 135 - 450 K/uL    MPV 8.6 5.0  - 10.5 fL    Neutrophils % 67.8 %    Lymphocytes % 22.9 %    Monocytes % 7.3 %    Eosinophils % 1.5 %    Basophils % 0.5 %    Neutrophils Absolute 7.2 1.7 - 7.7 K/uL    Lymphocytes Absolute 2.4 1.0 - 5.1 K/uL    Monocytes Absolute 0.8 0.0 - 1.3 K/uL    Eosinophils Absolute 0.2 0.0 - 0.6 K/uL    Basophils Absolute 0.1 0.0 - 0.2 K/uL   Basic Metabolic Panel   Result Value Ref Range    Sodium 140 136 - 145 mmol/L    Potassium 3.1 (L) 3.5 - 5.1 mmol/L    Chloride 101 99 - 110 mmol/L    CO2 27 21 - 32 mmol/L    Anion Gap 12 3 - 16    Glucose 153 (H) 70 - 99 mg/dL    BUN 6 (L) 7 - 20 mg/dL    Creatinine 0.6 0.6 - 1.1 mg/dL    Est, Glom Filt Rate >90     Calcium  9.2 8.3 - 10.6 mg/dL   POCT Glucose   Result Value Ref Range    POC Glucose 226 (H) 70 - 99 mg/dl    Performed on ACCU-CHEK    POCT Glucose   Result Value Ref Range    POC Glucose 151 (H) 70 - 99 mg/dl    Performed on ACCU-CHEK    POCT Glucose   Result Value Ref Range    POC Glucose  183 (H) 70 - 99 mg/dl    Performed on ACCU-CHEK    POCT Glucose   Result Value Ref Range    POC Glucose 184 (H) 70 - 99 mg/dl    Performed on ACCU-CHEK    POCT Glucose   Result Value Ref Range    POC Glucose 179 (H) 70 - 99 mg/dl    Performed on ACCU-CHEK    POCT Glucose   Result Value Ref Range    POC Glucose 159 (H) 70 - 99 mg/dl    Performed on ACCU-CHEK    POCT Glucose   Result Value Ref Range    POC Glucose 141 (H) 70 - 99 mg/dl    Performed on ACCU-CHEK    EKG 12 Lead (Abn HR)   Result Value Ref Range    Ventricular Rate 115 BPM    Atrial Rate 115 BPM    P-R Interval 146 ms    QRS Duration 64 ms    Q-T Interval 332 ms    QTc Calculation (Bazett) 459 ms    P Axis 59 degrees    R Axis 15 degrees    T Axis 32 degrees    Diagnosis       EKG performed in ER and to be interpreted by ER physician.  Confirmed by MD, ER ([id number]), editor COLEMAN-GREEN, MICKEY ([id number]) on 05/03/2024   2:01:43 PM       EKG   Interpreted in conjunction with emergencydepartment physician Chalmer Ryan Dunnings,*  Rhythm: sinus tachycardia  Rate: 110-120  Axis: normal  Ectopy: none  Conduction: normal  ST Segments: no acute change  T Waves:no acute change  Q Waves: none  Clinical Impression: sinus tachycardia  Comparison: 03/28/2024    ED BEDSIDE ULTRASOUND:  No results found.    RECENT VITALS:  BP: (!) 143/82, Temp: 98.4 F (36.9 C), Pulse: (!) 102,Respirations: 16, SpO2: 99 %     Procedures     No ED procedures performed    ED Course     Nursing Notes, Past Medical Hx, Past Surgical Hx, Social Hx, Allergies, and Family Hx were reviewed.         The patient was given the following medications:  Orders Placed This Encounter   Medications    DISCONTD: sodium chloride  flush 0.9 % injection 5-40 mL    DISCONTD: sodium chloride  flush 0.9 % injection 5-40 mL    DISCONTD: 0.9 % sodium chloride  infusion    lactated ringers  bolus 2,316 mL     Was full 30mL/kg fluid bolus ordered?:   Yes    ferrous sulfate  (IRON  325) tablet 325 mg    insulin  glargine (LANTUS ) injection vial 40 Units    levothyroxine  (SYNTHROID ) tablet 25 mcg    losartan  (COZAAR ) tablet 25 mg    atorvastatin  (LIPITOR ) tablet 20 mg    sodium chloride  flush 0.9 % injection 5-40 mL    sodium chloride  flush 0.9 % injection 5-40 mL    0.9 % sodium chloride  infusion    OR Linked Order Group     potassium chloride  (KLOR-CON  M) extended release tablet 40 mEq     potassium bicarb-citric acid  (EFFER-K ) effervescent tablet 40 mEq     potassium chloride  10 mEq/100 mL IVPB (Peripheral Line)    magnesium  sulfate 2000 mg in 50 mL IVPB premix    enoxaparin  (LOVENOX ) injection 40 mg     Indication of Use:   Prophylaxis-DVT/PE    OR Linked Order Group  ondansetron  (ZOFRAN -ODT) disintegrating tablet 4 mg     ondansetron  (ZOFRAN ) injection 4 mg    polyethylene glycol (GLYCOLAX ) packet 17 g    OR Linked Order Group     acetaminophen  (TYLENOL ) tablet 650 mg     acetaminophen  (TYLENOL ) suppository 650 mg    sodium chloride  flush 0.9 % injection 5-40 mL    sodium chloride  flush  0.9 % injection 5-40 mL    0.9 % sodium chloride  infusion    0.9 % sodium chloride  infusion    glucose chewable tablet 16 g    OR Linked Order Group     dextrose  bolus 10% 125 mL     dextrose  bolus 10% 250 mL    glucagon  injection 1 mg    dextrose  10 % infusion    insulin  lispro (HUMALOG ,ADMELOG ) injection vial 0-8 Units    DISCONTD: DAPTOmycin  (CUBICIN ) 500 mg in sodium chloride  0.9 % 50 mL IVPB     Does patient have decreased susceptibility to vancomycin  such as S. aureus MIC >= 2 (except for UTI, MIC of 2 is OK due to renal elimination), S. epidermidis MIC >= 4, or Enterococcus MIC >= 4?:   Yes    potassium chloride  (KLOR-CON  M) extended release tablet 40 mEq    gadoteridol  (PROHANCE ) injection 15 mL    ceFAZolin  (ANCEF ) 2,000 mg in sterile water  20 mL IV syringe     Antimicrobial Indications:   Bloodstream Infection       CONSULTS:  IP CONSULT TO INFECTIOUS DISEASES  IP CONSULT TO ORTHOPEDIC SURGERY    Review of Systems     Review of Systems   Constitutional:  Negative for chills and fever.   HENT:  Negative for ear pain and sore throat.    Eyes: Negative.  Negative for pain.   Respiratory:  Negative for cough and shortness of breath.    Cardiovascular:  Negative for chest pain and palpitations.   Gastrointestinal:  Negative for abdominal pain, nausea and vomiting.   Endocrine: Negative.    Genitourinary:  Positive for dysuria. Negative for hematuria.   Musculoskeletal:  Positive for arthralgias (right hip pain). Negative for back pain and neck pain.   Skin: Negative.    Allergic/Immunologic: Negative.    Neurological:  Negative for dizziness, weakness, light-headedness and headaches.   Hematological: Negative.    Psychiatric/Behavioral: Negative.     All other systems reviewed and are negative.      Past Medical, Surgical, Family, and Social History     She has a past medical history of Acute headache, Allergies, Asthma, Diabetes mellitus (HCC), Genital herpes, HTN (hypertension), Hyperlipidemia, Hypothyroid,  and Urticaria.  She has a past surgical history that includes cyst removal; hysteroscopy; myomectomy (N/A, 02/01/2022); CT ABSCESS DRAINAGE SOFT TISSUE (04/01/2024); and hip surgery (Left, 04/02/2024).  Her family history includes Diabetes in her father and mother.  She reports that she has never smoked. She has never used smokeless tobacco. She reports that she does not drink alcohol and does not use drugs.    Medications     Current Discharge Medication List        CONTINUE these medications which have NOT CHANGED    Details   DAPTOmycin  (CUBICIN ) infusion Infuse 400 mg intravenously in the morning for 11 days. Compound per protocol.  Qty: 5 g, Refills: 0      fluconazole  (DIFLUCAN ) 150 MG tablet Take 1 tablet by mouth every 3 days  Qty: 3 tablet, Refills: 0  levothyroxine  (SYNTHROID ) 25 MCG tablet Take 1 tablet by mouth Daily  Qty: 30 tablet, Refills: 3      simvastatin  (ZOCOR ) 40 MG tablet Take 1 tablet by mouth daily  Qty: 90 tablet, Refills: 1    Associated Diagnoses: Type 2 diabetes mellitus with ketoacidosis without coma, with long-term current use of insulin  (HCC)      losartan  (COZAAR ) 25 MG tablet TAKE ONE TABLET BY MOUTH DAILY  Qty: 90 tablet, Refills: 1    Comments: Please fill early. Patient lost prior prescription      Semaglutide , 1 MG/DOSE, (OZEMPIC , 1 MG/DOSE,) 4 MG/3ML SOPN sc injection Inject 1 mg into the skin every 7 days  Qty: 6 mL, Refills: 1    Associated Diagnoses: Type 2 diabetes mellitus with ketoacidosis without coma, with long-term current use of insulin  (HCC)      metFORMIN  (GLUCOPHAGE -XR) 500 MG extended release tablet Take 4 tablets by mouth daily (with breakfast)  Qty: 360 tablet, Refills: 1      ferrous sulfate  (FEROSUL) 325 (65 Fe) MG tablet Take 1 tablet by mouth daily (with breakfast)  Qty: 90 tablet, Refills: 1      cetirizine  (ZYRTEC ) 10 MG tablet Take 1 tablet by mouth daily  Qty: 90 tablet, Refills: 1      montelukast  (SINGULAIR ) 10 MG tablet Take 1 tablet by mouth  daily  Qty: 90 tablet, Refills: 1    Associated Diagnoses: Mild intermittent asthma without complication; Allergic rhinitis, unspecified seasonality, unspecified trigger      Cholecalciferol (VITAMIN D3) 50 MCG (2000 UT) TABS TAKE ONE TABLET BY MOUTH DAILY  Qty: 90 tablet, Refills: 3      albuterol  sulfate HFA (PROVENTIL ;VENTOLIN ;PROAIR ) 108 (90 Base) MCG/ACT inhaler Inhale 2 puffs into the lungs every 6 hours as needed for Wheezing or Shortness of Breath  Qty: 1 each, Refills: 5      insulin  glargine (LANTUS  SOLOSTAR) 100 UNIT/ML injection pen Inject 40 Units into the skin nightly  Qty: 12 mL, Refills: 3      insulin  lispro, 1 Unit Dial , (HUMALOG  KWIKPEN) 100 UNIT/ML SOPN Inject 10 Units into the skin 3 times daily (before meals) INJECT 10 UNITS UNDER THE SKIN three times WITH MEALS PLUS INJECT 2 UNITS FOR BLOOD SUGAR 150-200, 4 UNITS IF 201-250, 6 UNITS IF 251-300, 8 UNITS IF 301-350, 10 UNITS IF GREATER THAN 350.  Qty: 9 mL, Refills: 3      aspirin  81 MG EC tablet Take 1 tablet by mouth in the morning and at bedtime  Qty: 30 tablet, Refills: 3      blood glucose monitor strips Tes2t qam  times a day & as needed for symptoms of irregular blood glucose. Please provide test strips approved by insurance.  Qty: 100 strip, Refills: 2    Comments: Brand per patient preference. May round up to next available package size.  Associated Diagnoses: Type 2 diabetes mellitus without complication, with long-term current use of insulin  (HCC)      Continuous Glucose Sensor (FREESTYLE LIBRE 3 PLUS SENSOR) MISC Apply every 15 days  Qty: 6 each, Refills: 1    Associated Diagnoses: Type 2 diabetes mellitus with ketoacidosis without coma, with long-term current use of insulin  (HCC)      !! Insulin  Pen Needle (DROPLET PEN NEEDLES) 32G X 6 MM MISC USE ONCE DAILY  Qty: 100 each, Refills: 3      ibuprofen  (ADVIL ;MOTRIN ) 600 MG tablet Take 1 tablet by mouth every 6 hours as needed for  Pain  Qty: 120 tablet, Refills: 0      acetaminophen   (TYLENOL ) 500 MG tablet Take 1 tablet by mouth 4 times daily as needed for Pain  Qty: 120 tablet, Refills: 0      !! Insulin  Pen Needle 32G X 6 MM MISC Use four times daily and as needed  Qty: 100 each, Refills: 5      EPINEPHrine  (EPIPEN ) 0.3 MG/0.3ML SOAJ injection       !! Insulin  Pen Needle ([employer] PEN NEEDLES 31G) 31G X 8 MM MISC 1 each by Does not apply route daily  Qty: 100 each, Refills: 3      Lancets MISC 1 each by Does not apply route daily Please provide lancets approved by insurance.  Qty: 100 each, Refills: 3    Associated Diagnoses: Type 2 diabetes mellitus without complication, with long-term current use of insulin  (HCC)      Blood Glucose Monitoring Suppl (ACURA BLOOD GLUCOSE METER) w/Device KIT Please provide meter approved by insurance.  Check bs qam  Qty: 1 kit, Refills: 0    Associated Diagnoses: Type 2 diabetes mellitus without complication, with long-term current use of insulin  (HCC)       !! - Potential duplicate medications found. Please discuss with provider.          Allergies     She is allergic to lisinopril, peanuts [peanut oil], walnut, and ace inhibitors.    Physical Exam     INITIAL VITALS: BP: (!) 159/80, Temp: 100.2 F (37.9 C), Pulse: (!) 120, Respirations: 18, SpO2: 99 %     Physical Exam  Vitals reviewed.   Constitutional:       General: She is not in acute distress.     Appearance: She is not ill-appearing, toxic-appearing or diaphoretic.   HENT:      Head: Normocephalic and atraumatic.   Cardiovascular:      Rate and Rhythm: Regular rhythm. Tachycardia present.      Pulses: Normal pulses.      Heart sounds: Normal heart sounds. No murmur heard.     No friction rub. No gallop.   Pulmonary:      Effort: Pulmonary effort is normal. No tachypnea.      Breath sounds: Normal breath sounds. No stridor. No decreased breath sounds, wheezing, rhonchi or rales.   Abdominal:      Palpations: Abdomen is soft.      Tenderness: There is no abdominal tenderness. There is no guarding or  rebound.   Musculoskeletal:         General: Normal range of motion.      Right hip: Normal.      Left hip: Normal.      Comments: Right hip incision clean, dry, and intact.   Skin:     General: Skin is warm and dry.      Capillary Refill: Capillary refill takes less than 2 seconds.      Comments: Right arm PICC line without overlying erythema or induration   Neurological:      General: No focal deficit present.      Mental Status: She is alert and oriented to person, place, and time. Mental status is at baseline.      Cranial Nerves: Cranial nerves 2-12 are intact.      Sensory: Sensation is intact.      Motor: Motor function is intact.      Coordination: Coordination is intact.   Psychiatric:  Mood and Affect: Mood normal.         Behavior: Behavior normal.                 Vara Marsa SQUIBB, MD  Resident  05/05/24 1600

## 2024-05-03 NOTE — Plan of Care (Signed)
 Problem: Chronic Conditions and Co-morbidities  Goal: Patient's chronic conditions and co-morbidity symptoms are monitored and maintained or improved  Outcome: Progressing     Problem: Discharge Planning  Goal: Discharge to home or other facility with appropriate resources  Outcome: Progressing  Flowsheets (Taken 05/03/2024 2319)  Discharge to home or other facility with appropriate resources: Identify barriers to discharge with patient and caregiver     Problem: Pain  Goal: Verbalizes/displays adequate comfort level or baseline comfort level  Outcome: Progressing  Flowsheets (Taken 05/03/2024 2017)  Verbalizes/displays adequate comfort level or baseline comfort level:   Encourage patient to monitor pain and request assistance   Assess pain using appropriate pain scale   Administer analgesics based on type and severity of pain and evaluate response   Implement non-pharmacological measures as appropriate and evaluate response   Consider cultural and social influences on pain and pain management   Notify Licensed Independent Practitioner if interventions unsuccessful or patient reports new pain

## 2024-05-03 NOTE — Progress Notes (Signed)
 Patient arrived up to room 6305. I took vitals and hooked her up to tele but patient was talking on the phone at this time. Patient oriented to room and no questions at this time.

## 2024-05-03 NOTE — ED Provider Notes (Signed)
 ED Attending Attestation Note     Date of evaluation: 05/03/2024    This patient was seen by the resident.  I have seen and examined the patient, agree with the workup, evaluation, management and diagnosis. The care plan has been discussed.  I have reviewed the ECG and concur with the resident's interpretation.  My assessment reveals nontoxic-appearing adult female here with tachycardia, noted to have borderline fever here.  She is finishing course of daptomycin  for septic bursitis of the left hip.  On examination, she is able to move left hip, says it is sore but not newly painful.  The wound appears clean, dry, and appropriately healing without evidence secondary infection, no induration or obvious abscess.  Given the mild fever, climbing inflammatory markers, tachycardia to some suspicion for indolent infection, will likely require admission since she is triggering sepsis criteria at this point.     Chalmer Ryan Dunnings, MD  05/05/24 [phone]

## 2024-05-03 NOTE — H&P (Signed)
 V2.0  History and Physical      Name:  Shelly Richard DOB/Age/Sex: [birth year]  (39 y.o. female)   MRN & CSN:  [id number] & [id number] Encounter Date/Time: 05/03/2024 4:57 PM EST   Location:  B22/B22-22 PCP: Alexandro Bernice BRAVO, [name]       Hospital Day: 1    Assessment and Plan:   Shelly Richard is a 39 y.o. female with a pmh of DM-2, essential hypertension, hyperlipidemia who was sent by her home health care nurse due to persistent tachycardia.  Patient was found to have a fever of 100.2 F in ED    Hospital Problems           Last Modified POA    * (Principal) Sepsis (HCC) 05/03/2024 Yes   #Sepsis of unknown etiology (recent MSSA bacteremia and left hip bursitis)  - Tachycardia, fever of 100.2 F and leukocytosis with left shift  -  Lactic acid within normal range  - Worsening inflammatory markers: ESR 94 (85 on 1/13) and CRP = 126 (73 on 1/13)  - Patient denies any respiratory, GI or urinary symptoms  - Denies any subjective or documented fever at home  - Patient was hospitalized here from 12/12 through 04/07/2024 for MSSA bacteremia, left hip bursitis  - Patient was scheduled to complete her daptomycin  on 1/16, but it was extended by her infectious disease specialist due to worsening inflammatory markers  - Influenza/COVID-19 PCR negative  - Chest x-ray with no acute airspace disease  - X-ray hip with no acute osseous abnormalities    #DM-2 with hyperglycemia of 274    #Chronic medical conditions as mentioned in PMH    Plan:  Blood cultures x 2 were sent from right upper extremity PICC line and PIV  Will start on daptomycin  (has not received today's dose, usually takes in the evening)  Infectious disease consult  Continue on home doses of basal insulin   Start on medium dose SSI with hypoglycemia protocol  Check CK levels  Follow urine reflex to culture  Continue home medications appropriately  Supportive therapy    Disposition:   Current Living situation: Home  Expected Disposition:  Home  Estimated D/C: 2-3 days    Diet ADULT DIET; Regular; 4 carb choices (60 gm/meal); Low Fat/Low Chol/High Fiber/2 gm Na   DVT Prophylaxis [x]  Lovenox , []   Heparin, []  SCDs, []  Ambulation,  []  Eliquis, []  Xarelto, []  Coumadin   Code Status Full   Surrogate Decision Maker/ POA Spouse     Personally reviewed Lab Studies CBC, BMP, lactic acid levels, CRP, ESR, high-sensitivity troponin, influenza/COVID 19 PCR and Imaging     Discussed management of the case with ED provider who recommended admission for further management    EKG interpreted personally and results sinus tachycardia, VR = 115, QTc = 459 ms, no acute ST-T changes    Imaging that was interpreted personally includes chest x-ray and x-ray hip and results no acute cardiopulmonary disease, no acute osseous abnormalities    Drugs that require monitoring for toxicity include daptomycin  for rhabdomyolysis and the method of monitoring was CK levels        History from:     patient, family member -sister at bedside    History of Present Illness:     Chief Complaint: Tachycardia  Shelly Richard is a 39 y.o. female with pmh as mentioned above who was sent to ED by her home health care nurse due to persistent tachycardia.  Patient was  found to have a low-grade fever in ED (100.2 F)  Patient denies subjective or documented fever at home.  Patient was hospitalized here from 03/26/2024 through 04/07/2024 for MSSA bacteremia and left hip septic bursitis.  Patient was sent home on daptomycin  which was initially scheduled to complete on 04/30/2024, but was extended by her infectious disease specialist due to worsening inflammatory markers.  Complains of mild soreness in her left hip  Patient denies cough, SOB, chest or abdominal pain, nausea, vomiting, urinary symptoms or changes in bowel habits    Right upper extremity PICC line site intact  Review of Systems:        Pertinent positives and negatives discussed in HPI     Objective:   No intake or output data  in the 24 hours ending 05/03/24 1657   Vitals:   Vitals:    05/03/24 1325 05/03/24 1400   BP: (!) 159/80 (!) 165/97   Pulse: (!) 120 (!) 117   Resp: 18 15   Temp: 100.2 F (37.9 C)    TempSrc: Oral    SpO2: 99% 100%   Weight: 77.2 kg (170 lb 1.6 oz)    Height: 1.626 m (5' 4)        Personally Reviewed Medications Prior to Admission     Prior to Admission medications   Medication Sig Start Date End Date Taking? Authorizing Provider   DAPTOmycin  (CUBICIN ) infusion Infuse 400 mg intravenously in the morning for 11 days. Compound per protocol. 05/03/24 05/14/24  He, Peimei, MD   fluconazole  (DIFLUCAN ) 150 MG tablet Take 1 tablet by mouth every 3 days 04/30/24   He, Peimei, MD   levothyroxine  (SYNTHROID ) 25 MCG tablet Take 1 tablet by mouth Daily 04/20/24   Osterbrock, Erika E, [name]   Continuous Glucose Sensor (FREESTYLE LIBRE 3 PLUS SENSOR) MISC Apply every 15 days 04/20/24   Osterbrock, Erika E, [name]   simvastatin  (ZOCOR ) 40 MG tablet Take 1 tablet by mouth daily 04/20/24   Osterbrock, Erika E, [name]   losartan  (COZAAR ) 25 MG tablet TAKE ONE TABLET BY MOUTH DAILY 04/20/24   Osterbrock, Erika E, [name]   Semaglutide , 1 MG/DOSE, (OZEMPIC , 1 MG/DOSE,) 4 MG/3ML SOPN sc injection Inject 1 mg into the skin every 7 days 04/20/24   Osterbrock, Erika E, [name]   metFORMIN  (GLUCOPHAGE -XR) 500 MG extended release tablet Take 4 tablets by mouth daily (with breakfast) 04/20/24   Osterbrock, Erika E, [name]   ferrous sulfate  (FEROSUL) 325 (65 Fe) MG tablet Take 1 tablet by mouth daily (with breakfast) 04/20/24   Osterbrock, Erika E, [name]   cetirizine  (ZYRTEC ) 10 MG tablet Take 1 tablet by mouth daily 04/20/24   Osterbrock, Erika E, [name]   montelukast  (SINGULAIR ) 10 MG tablet Take 1 tablet by mouth daily 04/20/24   Osterbrock, Erika E, [name]   Cholecalciferol (VITAMIN D3) 50 MCG (2000 UT) TABS TAKE ONE TABLET BY MOUTH DAILY 04/20/24   Osterbrock, Erika E, [name]   albuterol  sulfate HFA  (PROVENTIL ;VENTOLIN ;PROAIR ) 108 (90 Base) MCG/ACT inhaler Inhale 2 puffs into the lungs every 6 hours as needed for Wheezing or Shortness of Breath 04/20/24   Alexandro Bernice BRAVO, [name]   insulin  glargine (LANTUS  SOLOSTAR) 100 UNIT/ML injection pen Inject 40 Units into the skin nightly 04/07/24   Rodriguez Galliano, Sheree HERO, MD   insulin  lispro, 1 Unit Dial , (HUMALOG  KWIKPEN) 100 UNIT/ML  SOPN Inject 10 Units into the skin 3 times daily (before meals) INJECT 10 UNITS UNDER THE SKIN three times WITH MEALS PLUS INJECT 2 UNITS FOR BLOOD SUGAR 150-200, 4 UNITS IF 201-250, 6 UNITS IF 251-300, 8 UNITS IF 301-350, 10 UNITS IF GREATER THAN 350. 04/07/24   Evern Aid, Sheree HERO, MD   aspirin  81 MG EC tablet Take 1 tablet by mouth in the morning and at bedtime 04/07/24   Rodriguez Galliano, Sheree HERO, MD   Insulin  Pen Needle (DROPLET PEN NEEDLES) 32G X 6 MM MISC USE ONCE DAILY  Patient not taking: Reported on 04/20/2024 07/09/22   Alexandro Bernice BRAVO, [name]   ibuprofen  (ADVIL ;MOTRIN ) 600 MG tablet Take 1 tablet by mouth every 6 hours as needed for Pain 02/01/22 04/20/24  Foley, Chauncey Sharper, MD   acetaminophen  (TYLENOL ) 500 MG tablet Take 1 tablet by mouth 4 times daily as needed for Pain 02/01/22 04/20/24  Foley, Chauncey Sharper, MD   Insulin  Pen Needle 32G X 6 MM MISC Use four times daily and as needed  Patient not taking: Reported on 04/20/2024 06/12/21   Alexandro Bernice BRAVO, [name]   EPINEPHrine  (EPIPEN ) 0.3 MG/0.3ML SOAJ injection  04/05/21   [provider]   blood glucose monitor strips Tes2t qam  times a day & as needed for symptoms of irregular blood glucose. Please provide test strips approved by insurance.  Patient not taking: Reported on 04/20/2024 12/29/19   Alexandro Bernice BRAVO, [name]   Insulin  Pen Needle ([employer] PEN NEEDLES 31G) 31G X 8 MM MISC 1 each by Does not apply route daily  Patient not taking: Reported on 04/20/2024 02/10/19   Alexandro Bernice BRAVO, [name]   Lancets MISC 1 each by Does not apply  route daily Please provide lancets approved by insurance.  Patient not taking: Reported on 04/20/2024 02/16/18   Tussey, Rocio G, MD   Blood Glucose Monitoring Suppl [facility] BLOOD GLUCOSE METER) w/Device KIT Please provide meter approved by insurance.  Check bs qam 02/16/18   Esmeralda Lazaro MATSU, MD       Physical Exam:        General: NAD, low-grade fever, not requiring supplemental oxygen  Eyes: EOMI  ENT: neck supple  Cardiovascular: Regular rhythm and tachycardic rate.  Respiratory: Clear to auscultation  Gastrointestinal: Soft, non tender  Genitourinary: no suprapubic tenderness  Musculoskeletal: No bilateral leg edema  Skin: warm, dry.  Right upper extremity PICC line site-clean and intact  Neuro: Alert.  Awake.  Oriented x 4  Psych: Mood appropriate.       Past Medical History:   PMHx   Past Medical History:   Diagnosis Date    Acute headache     Allergies     Asthma     Diabetes mellitus (HCC)     Genital herpes     HTN (hypertension)     Hyperlipidemia     Hypothyroid     Urticaria      PSHX:  has a past surgical history that includes cyst removal; hysteroscopy; myomectomy (N/A, 02/01/2022); CT ABSCESS DRAINAGE SOFT TISSUE (04/01/2024); and hip surgery (Left, 04/02/2024).  Allergies:   Allergies   Allergen Reactions    Lisinopril Other (See Comments) and Angioedema     Swollen lips    Peanuts [Peanut Oil] Shortness Of Breath     THROAT ITCHES - Walnuts, PEANUTS     Walnut Shortness Of Breath     THROAT ITCHES -  Walnuts, PEANUTS     Ace Inhibitors Swelling     Fam HX: family history includes Diabetes in her father and mother.  Soc HX:   Social History     Socioeconomic History    Marital status: Single   Tobacco Use    Smoking status: Never    Smokeless tobacco: Never   Vaping Use    Vaping status: Never Used   Substance and Sexual Activity    Alcohol use: Never    Drug use: No    Sexual activity: Yes     Partners: Male     Social Drivers of Health     Financial Resource Strain: Low Risk  (08/27/2022)    Overall  Financial Resource Strain (CARDIA)     Difficulty of Paying Living Expenses: Not hard at all   Food Insecurity: Patient Declined (03/29/2024)    Hunger Vital Sign     Worried About Running Out of Food in the Last Year: Patient declined     Barista in the Last Year: Patient declined   Transportation Needs: Patient Declined (03/29/2024)    PRAPARE - Therapist, Art (Medical): Patient declined     Lack of Transportation (Non-Medical): Patient declined    Received from [facility]   Housing Stability: Patient Declined (03/29/2024)    Housing Stability Vital Sign     Unable to Pay for Housing in the Last Year: Patient declined     Number of Times Moved in the Last Year: 0     Homeless in the Last Year: Patient declined       Medications:   Medications: Reviewed  Infusions: Normal saline infusion  PRN Meds: Refer to orders    Labs      CBC:   Recent Labs     05/03/24  1417   WBC 12.3*   HGB 10.1*   PLT 362     BMP:    Recent Labs     05/03/24  1417   NA 137   K 3.7   CL 101   CO2 25   BUN 6*   CREATININE 0.7   GLUCOSE 274*     Hepatic:   Recent Labs     05/03/24  1417   AST 11*   ALT 6*   BILITOT 0.6   ALKPHOS 156*     Lipids:   Lab Results   Component Value Date/Time    CHOL 154 11/25/2023 02:44 PM    HDL 22 11/25/2023 02:44 PM    TRIG 170 11/25/2023 02:44 PM     Hemoglobin A1C:   Lab Results   Component Value Date/Time    LABA1C 13.9 03/27/2024 03:47 AM     TSH:   Lab Results   Component Value Date/Time    TSH 1.92 08/27/2022 04:35 PM     Troponin: No results found for: TROPONINT  Lactic Acid: No results for input(s): LACTA in the last 72 hours.  BNP: No results for input(s): PROBNP in the last 72 hours.  UA:  Lab Results   Component Value Date/Time    NITRU Negative 03/28/2024 02:03 AM    COLORU Yellow 03/28/2024 02:03 AM    PHUR 6.0 03/28/2024 02:03 AM    PHUR 5.0 03/28/2024 02:03 AM    PHUR 5.5 02/25/2017 12:22 PM    WBCUA  3-5 03/28/2024 02:03 AM    RBCUA 5-10 03/28/2024  02:03 AM    BACTERIA 3+ 03/28/2024 02:03 AM    CLARITYU Clear 03/28/2024 02:03 AM    LEUKOCYTESUR TRACE 03/28/2024 02:03 AM    UROBILINOGEN 0.2 03/28/2024 02:03 AM    BILIRUBINUR Negative 03/28/2024 02:03 AM    BLOODU SMALL 03/28/2024 02:03 AM    GLUCOSEU Negative 03/28/2024 02:03 AM    KETUA Negative 03/28/2024 02:03 AM     Urine Cultures:   Lab Results   Component Value Date/Time    LABURIN 25,000 CFU/ml  PBP2= Negative   03/28/2024 01:54 AM     Blood Cultures:   Lab Results   Component Value Date/Time    BC No Growth after 4 days of incubation. 04/04/2024 12:16 PM     Lab Results   Component Value Date/Time    BLOODCULT2 No Growth after 4 days of incubation. 03/30/2024 04:21 PM     Organism:   Lab Results   Component Value Date/Time    ORG Staphylococcus aureus 04/02/2024 02:09 PM       Imaging/Diagnostics Last 24 Hours   XR HIP 2-3 VW W PELVIS LEFT  Result Date: 05/03/2024  XR HIP 2-3 VW W PELVIS LEFT Indication: Left hip pain. History trochanteric bursitis. COMPARISON: 03/31/2024 Findings: Frontal view the pelvis and frog-leg lateral view of the left hip were obtained. The bones are well-mineralized. There is no acute fracture or dislocation. The regional soft tissues are unremarkable.     Impression: No acute osseous abnormality. Electronically signed by Belvie Plana, MD    XR CHEST PORTABLE  Result Date: 05/03/2024  XR CHEST PORTABLE Indication: Cough COMPARISON: 03/27/2024 Findings: Portable AP upright view of the chest was obtained. The heart is normal in size and configuration. The lungs are clear. There is no effusion or pneumothorax.     Impression: No acute cardiopulmonary abnormality. Electronically signed by Belvie Plana, MD        Electronically signed by Myrick RAYMOND Lynn, MD on 05/03/2024 at 4:57 PM

## 2024-05-04 ENCOUNTER — Inpatient Hospital Stay: Admit: 2024-05-04 | Payer: BLUE CROSS/BLUE SHIELD | Primary: Oncology

## 2024-05-04 LAB — CBC WITH AUTO DIFFERENTIAL
Basophils %: 0.5 %
Basophils Absolute: 0.1 K/uL (ref 0.0–0.2)
Eosinophils %: 1.3 %
Eosinophils Absolute: 0.1 K/uL (ref 0.0–0.6)
Hematocrit: 30.3 % — ABNORMAL LOW (ref 36.0–48.0)
Hemoglobin: 10.2 g/dL — ABNORMAL LOW (ref 12.0–16.0)
Lymphocytes %: 21.7 %
Lymphocytes Absolute: 2.4 K/uL (ref 1.0–5.1)
MCH: 26.9 pg (ref 26.0–34.0)
MCHC: 33.8 g/dL (ref 31.0–36.0)
MCV: 79.6 fL — ABNORMAL LOW (ref 80.0–100.0)
MPV: 8.3 fL (ref 5.0–10.5)
Monocytes %: 8.4 %
Monocytes Absolute: 0.9 K/uL (ref 0.0–1.3)
Neutrophils %: 68.1 %
Neutrophils Absolute: 7.4 K/uL (ref 1.7–7.7)
Platelets: 360 K/uL (ref 135–450)
RBC: 3.81 M/uL — ABNORMAL LOW (ref 4.00–5.20)
RDW: 16.3 % — ABNORMAL HIGH (ref 12.4–15.4)
WBC: 10.8 K/uL (ref 4.0–11.0)

## 2024-05-04 LAB — COMPREHENSIVE METABOLIC PANEL W/ REFLEX TO MG FOR LOW K
ALT: <5 U/L — ABNORMAL LOW (ref 10–40)
AST: 10 U/L — ABNORMAL LOW (ref 15–37)
Albumin/Globulin Ratio: 0.7 — ABNORMAL LOW (ref 1.1–2.2)
Albumin: 3.2 g/dL — ABNORMAL LOW (ref 3.4–5.0)
Alkaline Phosphatase: 152 U/L — ABNORMAL HIGH (ref 40–129)
Anion Gap: 12 (ref 3–16)
BUN: 5 mg/dL — ABNORMAL LOW (ref 7–20)
CO2: 26 mmol/L (ref 21–32)
Calcium: 9.3 mg/dL (ref 8.3–10.6)
Chloride: 104 mmol/L (ref 99–110)
Creatinine: 0.6 mg/dL (ref 0.6–1.1)
Est, Glom Filt Rate: >90
Glucose: 125 mg/dL — ABNORMAL HIGH (ref 70–99)
Potassium reflex Magnesium: 2.9 mmol/L — CL (ref 3.5–5.1)
Sodium: 142 mmol/L (ref 136–145)
Total Bilirubin: 0.6 mg/dL (ref 0.0–1.0)
Total Protein: 7.9 g/dL (ref 6.4–8.2)

## 2024-05-04 LAB — POCT GLUCOSE
POC Glucose: 151 mg/dL — ABNORMAL HIGH (ref 70–99)
POC Glucose: 183 mg/dL — ABNORMAL HIGH (ref 70–99)
POC Glucose: 184 mg/dL — ABNORMAL HIGH (ref 70–99)
POC Glucose: 226 mg/dL — ABNORMAL HIGH (ref 70–99)

## 2024-05-04 LAB — LACTATE, SEPSIS: Lactic Acid, Sepsis: 0.8 mmol/L (ref 0.4–1.9)

## 2024-05-04 LAB — MAGNESIUM: Magnesium: 1.56 mg/dL — ABNORMAL LOW (ref 1.80–2.40)

## 2024-05-04 LAB — CK: Total CK: 45 U/L (ref 26–192)

## 2024-05-04 LAB — POTASSIUM: Potassium: 3.9 mmol/L (ref 3.5–5.1)

## 2024-05-04 MED ORDER — GADOTERIDOL 279.3 MG/ML IV SOLN
279.3 | Freq: Once | INTRAVENOUS | Status: AC | PRN
Start: 2024-05-04 — End: 2024-05-04
  Administered 2024-05-04: 15 mL via INTRAVENOUS

## 2024-05-04 MED ORDER — POTASSIUM CHLORIDE CRYS ER 20 MEQ PO TBCR
20 | Freq: Two times a day (BID) | ORAL | Status: AC
Start: 2024-05-04 — End: 2024-05-05

## 2024-05-04 MED FILL — POTASSIUM CHLORIDE 10 MEQ/100ML IV SOLN: 10 MEQ/0ML | INTRAVENOUS | Qty: 100 | Fill #0

## 2024-05-04 MED FILL — DAPTOMYCIN 500 MG IV SOLR: 500 mg | INTRAVENOUS | Qty: 10 | Fill #0

## 2024-05-04 MED FILL — LOSARTAN POTASSIUM 25 MG PO TABS: 25 mg | ORAL | Qty: 1 | Fill #0

## 2024-05-04 MED FILL — ENOXAPARIN SODIUM 40 MG/0.4ML IJ SOSY: 40 MG/0.4ML | INTRAMUSCULAR | Qty: 0.4 | Fill #0

## 2024-05-04 MED FILL — INSULIN LISPRO 100 UNIT/ML IJ SOLN: 100 [IU]/mL | INTRAMUSCULAR | Qty: 2 | Fill #0

## 2024-05-04 MED FILL — FERROUS SULFATE 325 (65 FE) MG PO TABS: 325 (65 Fe) MG | ORAL | Qty: 1 | Fill #0

## 2024-05-04 MED FILL — ACETAMINOPHEN 325 MG PO TABS: 325 mg | ORAL | Qty: 2 | Fill #0

## 2024-05-04 MED FILL — LEVOTHYROXINE SODIUM 25 MCG PO TABS: 25 ug | ORAL | Qty: 1 | Fill #0

## 2024-05-04 MED FILL — LANTUS 100 UNIT/ML SC SOLN: 100 [IU]/mL | SUBCUTANEOUS | Qty: 40 | Fill #0

## 2024-05-04 MED FILL — INSULIN LISPRO 100 UNIT/ML IJ SOLN: 100 [IU]/mL | INTRAMUSCULAR | Qty: 4 | Fill #0

## 2024-05-04 NOTE — Consults (Signed)
 Infectious Diseases   Consult Note      Reason for Consult: on iv abx for hx MSSA bacteremia    Requesting Physician: Dr. Evern     Date of Admission: 05/03/2024  Subjective:   CHIEF COMPLAINT: fever, tachycardia     HPI:  39 year old African-American female with history of DM2, HTN, HLD that presented on 03/27/24 with headache and shortness of breath. She was found to be in DKA and was admitted to the ICU.  During that admission she was also found to have MSSA bacteremia and left hip greater trochanteric bursitis. MRI showed 4.6cm left greater trochanteric bursal fluid collection. This was aspirated with IR and yielded growth of MSSA.  orthopedics evaluated and she was taken for operative washout of the bursa on 12/19 with operative cultures showing growth of the same. TTE without any evidence of IE.  We will continue nafcillin  while she was inpatient.  Then was sent to OP infusion center to complete course of dapto, then recently has been doing home IV abx. She denies any missed doses. Due to elevation in her inflammatory markers, had plan for outpatient MRI of her hip as well as extension of her course of daptomycin  until 1/30.  PICC remains in place without any issues. Denies any muscle pain, no N/V/D.  She denies any respiratory complaints.  She states continued left hip tenderness. States feels sometimes it gives out when putting weight on there. The incision is well healed and no evidence of dehiscence.  She denies any other joint pain.  Yesterday the visiting nurse came, reported fever of 100.4 and tachycardia in the 110s.  Therefore she came to the ER and is now admitted for further assessment.  Upon presentation, WBC was 12.3 and Tmax of 101.8.  ESR/CRP was 94/126 respectively.  She has been resumed on daptomycin       Current abx: Daptomycin          Past Surgical History:       Diagnosis Date    Acute headache     Allergies     Asthma     Diabetes mellitus (HCC)     Genital herpes     HTN  (hypertension)     Hyperlipidemia     Hypothyroid     Urticaria          Procedure Laterality Date    CT FLUID COLLECTION DRAINAGE SOFT TISSUE  04/01/2024    CT FLUID COLLECTION DRAINAGE SOFT TISSUE 04/01/2024 [facility] CT SCAN    CYST REMOVAL      HIP SURGERY Left 04/02/2024    INCISION AND DRAINAGE OF LEFT HIP GREATER TROCHANTERIC BURSA performed by Sherline Lamar Gaskins, MD at [facility] OR    HYSTEROSCOPY      x2    MYOMECTOMY N/A 02/01/2022    ROBOTIC MYOMECTOMY performed by Foley, Chauncey Sharper, MD at [facility] OR       Social History:    TOBACCO:   reports that she has never smoked. She has never used smokeless tobacco.  ETOH:   reports no history of alcohol use.  There is no history of illicit drug use or other significant epidemiologic exposures.    Family History:       Problem Relation Age of Onset    Diabetes Mother     Diabetes Father      There is no family history of autoimmune diseases or significant infectious diseases.    Current Medications:    Current Facility-Administered Medications: potassium chloride  (  KLOR-CON  M) extended release tablet 40 mEq, 40 mEq, Oral, BID WC  ferrous sulfate  (IRON  325) tablet 325 mg, 325 mg, Oral, Daily with breakfast  insulin  glargine (LANTUS ) injection vial 40 Units, 40 Units, SubCUTAneous, Nightly  levothyroxine  (SYNTHROID ) tablet 25 mcg, 25 mcg, Oral, Daily  losartan  (COZAAR ) tablet 25 mg, 25 mg, Oral, Daily  [Held by provider] atorvastatin  (LIPITOR ) tablet 20 mg, 20 mg, Oral, Nightly  sodium chloride  flush 0.9 % injection 5-40 mL, 5-40 mL, IntraVENous, 2 times per day  sodium chloride  flush 0.9 % injection 5-40 mL, 5-40 mL, IntraVENous, PRN  0.9 % sodium chloride  infusion, , IntraVENous, PRN  potassium chloride  (KLOR-CON  M) extended release tablet 40 mEq, 40 mEq, Oral, PRN **OR** potassium bicarb-citric acid  (EFFER-K ) effervescent tablet 40 mEq, 40 mEq, Oral, PRN **OR** potassium chloride  10 mEq/100 mL IVPB (Peripheral Line), 10 mEq, IntraVENous, PRN  magnesium  sulfate 2000 mg in 50  mL IVPB premix, 2,000 mg, IntraVENous, PRN  enoxaparin  (LOVENOX ) injection 40 mg, 40 mg, SubCUTAneous, Daily  ondansetron  (ZOFRAN -ODT) disintegrating tablet 4 mg, 4 mg, Oral, Q8H PRN **OR** ondansetron  (ZOFRAN ) injection 4 mg, 4 mg, IntraVENous, Q6H PRN  polyethylene glycol (GLYCOLAX ) packet 17 g, 17 g, Oral, Daily PRN  acetaminophen  (TYLENOL ) tablet 650 mg, 650 mg, Oral, Q6H PRN **OR** acetaminophen  (TYLENOL ) suppository 650 mg, 650 mg, Rectal, Q6H PRN  sodium chloride  flush 0.9 % injection 5-40 mL, 5-40 mL, IntraVENous, 2 times per day  sodium chloride  flush 0.9 % injection 5-40 mL, 5-40 mL, IntraVENous, PRN  0.9 % sodium chloride  infusion, , IntraVENous, PRN  glucose chewable tablet 16 g, 4 tablet, Oral, PRN  dextrose  bolus 10% 125 mL, 125 mL, IntraVENous, PRN **OR** dextrose  bolus 10% 250 mL, 250 mL, IntraVENous, PRN  glucagon  injection 1 mg, 1 mg, SubCUTAneous, PRN  dextrose  10 % infusion, , IntraVENous, Continuous PRN  insulin  lispro (HUMALOG ,ADMELOG ) injection vial 0-8 Units, 0-8 Units, SubCUTAneous, 4x Daily AC & HS  DAPTOmycin  (CUBICIN ) 500 mg in sodium chloride  0.9 % 50 mL IVPB, 500 mg, IntraVENous, Q24H    Allergies   Allergen Reactions    Lisinopril Other (See Comments) and Angioedema     Swollen lips    Peanuts [Peanut Oil] Shortness Of Breath     THROAT ITCHES - Walnuts, PEANUTS     Walnut Shortness Of Breath     THROAT ITCHES - Walnuts, PEANUTS     Ace Inhibitors Swelling        REVIEW OF SYSTEMS:    CONSTITUTIONAL: Positive for malaise, fevers and chills  EYES:  negative for blurred vision, eye discharge, visual disturbance and icterus  HEENT:  negative for hearing loss, tinnitus, ear drainage, sinus pressure, nasal congestion  RESPIRATORY:  No cough, shortness of breath, hemoptysis  CARDIOVASCULAR:  negative for chest pain, palpitations, exertional chest pressure  GASTROINTESTINAL:  negative for nausea, vomiting, diarrhea, constipation, blood in stool   GENITOURINARY:  negative for frequency,  dysuria, urinary incontinence, decreased urine volume  HEMATOLOGIC/LYMPHATIC:  negative for easy bruising, bleeding   ALLERGIC/IMMUNOLOGIC:  negative for recurrent infections, angioedema, anaphylaxis   ENDOCRINE:  negative for weight changes and diabetic symptoms including polyuria, polydipsia  MUSCULOSKELETAL: Positive for left hip tenderness  NEUROLOGICAL:  negative for headaches, slurred speech, unilateral weakness  PSYCHIATRIC/BEHAVIORAL: negative for hallucinations, behavioral problems, confusion       Objective:   PHYSICAL EXAM:      VITALS:  BP 135/87   Pulse 95   Temp 98.6 F (37 C) (Oral)   Resp  16   Ht 1.626 m (5' 4)   Wt 75.5 kg (166 lb 6.4 oz)   LMP 03/23/2024   SpO2 96%   BMI 28.56 kg/m      24HR INTAKE/OUTPUT:    Intake/Output Summary (Last 24 hours) at 05/04/2024 1332  Last data filed at 05/04/2024 0500  Gross per 24 hour   Intake 3142.55 ml   Output 6 ml   Net 3136.55 ml     CONSTITUTIONAL:  Awake, alert, cooperative  HEENT: NCAT, PERRL, EOMI.  Sclera white  NECK:  Supple, symmetrical, trachea midline  LUNGS:  no increased work of breathing and clear to auscultation  CARDIOVASCULAR: S1 and S2, tachycardic  ABDOMEN:  normal bowel sounds, non-tender, non-distended  LYMPHADENOPATHY:  no axillary or supraclavicular adenopathy.   PSYCHIATRIC: Oriented to person place and time. No obvious depression or anxiety.  MUSCULOSKELETAL: Left hip incision is well-healed without any surrounding induration or redness evident, slight tenderness to palpation in all weightbearing  SKIN:  normal skin color, texture, turgor and no redness, warmth, or swelling.   NEUROLOGIC: nonfocal exam  ACCESS: PICC line in place without any induration at insertion site. Not tender    DATA:    Old records have been reviewed    CBC:  Recent Labs     05/03/24  1417 05/04/24  0507   WBC 12.3* 10.8   RBC 3.75* 3.81*   HGB 10.1* 10.2*   HCT 29.8* 30.3*   PLT 362 360   MCV 79.6* 79.6*   MCH 27.0 26.9   MCHC 33.9 33.8   RDW 16.3*  16.3*      BMP:  Recent Labs     05/03/24  1417 05/04/24  0507   NA 137 142   K 3.7 2.9*   CL 101 104   CO2 25 26   BUN 6* 5*   CREATININE 0.7 0.6   CALCIUM  9.2 9.3   GLUCOSE 274* 125*        Cultures:   Blood cultures x 2 1/19: Pending    Blood cultures x 2 12/21: Neg      OR culture left hip bursa 12/19: Rare growth Staph aureus, MSSA     Blood cultures x 2 12/16: No growth     Left hip bursa aspirate culture 12/18: Moderate growth MSSA     Blood cultures x 2 12/15: Positive for MSSA     Blood cultures x 2 12/13: Positive for MSSA  Staphylococcus aureus (2)     Antibiotic Interpretation MIC   Method Status     clindamycin  Sensitive <=0.25 mcg/mL BACTERIAL SUSCEPTIBILITY PANEL BY MIC       erythromycin  Sensitive <=0.25 mcg/mL BACTERIAL SUSCEPTIBILITY PANEL BY MIC       levofloxacin Resistant 4 mcg/mL BACTERIAL SUSCEPTIBILITY PANEL BY MIC       linezolid Sensitive 2 mcg/mL BACTERIAL SUSCEPTIBILITY PANEL BY MIC       oxacillin Sensitive 0.5 mcg/mL BACTERIAL SUSCEPTIBILITY PANEL BY MIC       tetracycline Sensitive <=1 mcg/mL BACTERIAL SUSCEPTIBILITY PANEL BY MIC         Tetracyclines have decreased renal elimination and  urinary bladder concentrations.  Use of this class of  agents for urinary tract infection should be limited to  patients that cannot tolerate an alternative therapy.           trimethoprim-sulfamethoxazole Sensitive <=10 mcg/mL BACTERIAL SUSCEPTIBILITY PANEL BY MIC       vancomycin  Sensitive 1 mcg/mL BACTERIAL SUSCEPTIBILITY  PANEL BY MIC          Urine culture 12/14: 25,000 CFU MSSA      Radiology Review:  All pertinent images / reports were reviewed as a part of this visit.     Portable chest x-ray 1/19:  No acute cardiopulmonary abnormality    X-ray left hip and pelvis 1/19:  No acute osseous abnormality.    MRI left hip with and without contrast 12/17:  1. There is a 4.6 cm left greater trochanter bursal fluid collection with thin tract which extends superiorly into the left gluteus medius muscle.  There is extensive surrounding edema as well as edema within the left gluteus medius muscle, and lateral   aspect of the adjacent left gluteus maximus muscle. In the setting of infection this is most worrisome for a septic bursitis-abscess.  2. No evidence of any osteomyelitis.  3. No acute osseous abnormality.    Assessment:     Patient Active Problem List   Diagnosis    Mixed hyperlipidemia    Iron  deficiency anemia    Type 2 diabetes mellitus with hyperglycemia, with long-term current use of insulin  (HCC)    Essential hypertension    Acquired hypothyroidism    Mild intermittent asthma without complication    Allergic rhinitis    Heart murmur    Uterine fibroid    Class 1 obesity due to excess calories with serious comorbidity and body mass index (BMI) of 31.0 to 31.9 in adult    Abnormal uterine bleeding    Eczema    Glaucoma suspect, bilateral    Hidradenitis axillaris    HSV-2 infection    Myopic astigmatism, bilateral    Strabismus    S/P myomectomy    DKA, type 2, not at goal [facility])    Acute nonintractable headache    MSSA bacteremia    Diabetic ketoacidosis without coma associated with type 2 diabetes mellitus (HCC)    Left hip pain    Abscess of bursa of left hip    Sepsis [facility])          Plan:   39 year old African-American female with history of DM2, HTN, HLD that presented on 03/27/24 with headache and shortness of breath. She was found to be in DKA and was admitted to the ICU.  During that admission she was also found to have MSSA bacteremia and left hip greater trochanteric bursitis. She is on IV abx course with daptomycin  and here with fevers and tachycardia     MSSA bacteremia and left hip greater trochanteric bursitis:  - hx of recent admission here on 03/2024 in the ICU with DKA, was also found to have MSSA bacteremia with left hip trochanteric greater bursitis  - previous MRI hip on  03/31/2024 showed 4.6cm left greater trochanteric bursal fluid collection.  This was aspirated with IR and yielded  growth of MSSA.    - orthopedics performed operative washout of the bursa on 04/02/24 with operative cultures showing growth of the same.  -Patient has been on a course of IV daptomycin  now doing this at home.  This was initially planned to end on 1/16 however due to persistently elevated inflammatory markers, was extended to 1/30 pending repeat MRI of her hip  -Now admitted on 1/19 with fever and tachycardia  - WBC was 12.3 and Tmax of 101.8.  ESR/CRP was 94/126 respectively.  She has been resumed on daptomycin   - Blood cultures x 2 taken on admission from 1/19 is pending.  No evidence of PICC line area induration or infection.  -Patient denies any complaints besides left hip tenderness, states slight pain on bearing weight and sometimes gives out.  Will obtain MRI of this area to further evaluate  -Depending on what MRI shows, may warrant orthopedics re-eval.     DM2:  - Good glycemic control to aid in healing and prevention of further infections.  - A1c on last admission was 13.9, consider recheck  - Patient previously lost her insurance and was not on any diabetic medications, this has been resumed since the last admission.     Medical Decision Making:  The following items were considered in medical decision making:  Discussion of patient care with other providers  Reviewed clinical lab tests  Reviewed radiology tests  Reviewed other diagnostic tests/interventions  Independent review of radiologic images  Microbiology cultures and other micro tests reviewed      Risk of Complications/Morbidity: High   Illness(es)/ Infection present that pose threat to bodily function.   There is potential for severe exacerbation of infection/side effects of treatment.  Therapy requires intensive monitoring for antimicrobial agent toxicity    Please note that this chart was generated using Dragon dictation software. Although every effort was made to ensure the accuracy of this automated transcription, some errors in  transcription may have occurred inadvertently.  Any pictures or media included in this note were obtained after taking informed verbal consent from the patient and with their approval to include those in the patient's medical record.     Discussed with patient, her sister over the phone, social work and primary team, Dr. Evern Rhea Jaidyn Usery, MD

## 2024-05-04 NOTE — Progress Notes (Signed)
 4 Eyes Skin Assessment     NAME:  Shelly Richard  DATE OF BIRTH:  July 09, 1985  MEDICAL RECORD NUMBER:  [id number]    The patient is being assessed for  Admission    I agree that at least one RN has performed a thorough Head to Toe Skin Assessment on the patient. ALL assessment sites listed below have been assessed.      Areas assessed by both nurses:    Head, Face, Ears, Shoulders, Back, Chest, Arms, Elbows, Hands, Sacrum. Buttock, Coccyx, Ischium, and Legs. Feet and Heels        Does the Patient have a Wound? No noted wound(s)       Braden Prevention initiated by RN: No  Wound Care Orders initiated by RN: No    For hospital-acquired stage 1 & 2 and ALL Stage 3,4, Unstageable, DTI, NWPT, and Complex wounds: place order IP Wound Care/Ostomy Nurse Eval and Treat by RN under ORDER ENTRY: No    New Ostomies, if present place, Ostomy referral order under ORDER ENTRY: No     Nurse 1 eSignature: Electronically signed by Damien Ned, RN on 05/04/24 at 11:55 AM EST    **SHARE this note so that the co-signing nurse can place an eSignature**    Nurse 2 eSignature: Electronically signed by Venetia Poland, RN on 05/04/24 at 11:58 AM EST

## 2024-05-04 NOTE — Progress Notes (Signed)
 V2.0    [facility] Progress Note      Name:  Shelly Richard DOB/Age/Sex: January 25, 1986  (39 y.o. female)   MRN & CSN:  [id number] & [id number] Encounter Date/Time: 05/04/2024 9:58 AM EST   Location:  6305/6305-01 PCP: Alexandro Bernice BRAVO, [name]     Attending:[facility]rig[facility]    Assessment and Recommendations     39 year old female with a history of uncontrolled diabetes mellitus with last A1c of 13.9, recent hospitalization for DKA which was complicated by left hip bursitis requiring I&D and long-term IV antibiotics presenting from home with tachycardia, fever spikes.  Concern for recurrence of left hip bursitis.  Currently with MRI of the left hip pending.    Fever  Possible sepsis secondary to left hip bursitis  - Follow-up MRI left hip.  -Continue IV daptomycin .  - ID consult.  - Continue to follow blood cultures.    Hypothyroidism  - Continue Synthroid .    Uncontrolled diabetes mellitus 13.9% last A1c  - Recheck A1c.  - Continue Lantus  40 units nightly.  Continue sliding scale as ordered.    Hypertension  - Continue losartan     Microcytic anemia  - Continue iron  supplementation.        Diet ADULT DIET; Regular; 4 carb choices (60 gm/meal); Low Fat/Low Chol/High Fiber/2 gm Na   DVT Prophylaxis [x]  Lovenox , []   Heparin, []  SCDs, []  Ambulation,  []  Eliquis, []  Xarelto  []  Coumadin   Code Status Full Code   Disposition From: Home  Expected Disposition: tbd  Estimated Date of Discharge: tbd  Patient requires continued admission due to Possible Sepsis   Surrogate Decision Maker/ POA  [name] listed as primary contact.      Personally reviewed Lab Studies and Imaging     Discussed management of the case with case management.  I reviewed IDs consult note.        Subjective:     Patient seen and evaluated at bedside. Complaining of feeling lightheaded.       Review of Systems:      Pertinent positives and negatives discussed in HPI    Objective:     Intake/Output Summary (Last  24 hours) at 05/04/2024 0958  Last data filed at 05/04/2024 0500  Gross per 24 hour   Intake 3142.55 ml   Output 6 ml   Net 3136.55 ml      Vitals:   Vitals:    05/04/24 0429 05/04/24 0600 05/04/24 0758 05/04/24 0855   BP: (!) 150/79  (!) 151/89    Pulse: (!) 107  (!) 104    Resp: 16  16    Temp: 99 F (37.2 C)  (!) 100.6 F (38.1 C) 100.2 F (37.9 C)   TempSrc: Oral  Oral Oral   SpO2: 99%  97%    Weight:  75.5 kg (166 lb 6.4 oz)     Height:             Physical Exam:      General: NAD  Eyes: EOMI  ENT: neck supple  Cardiovascular: Tachycardic.   Respiratory: Clear to auscultation  Gastrointestinal: Soft, non tender  Genitourinary: no suprapubic tenderness  Musculoskeletal: No edema  Skin: warm, dry  Neuro: Alert.  Psych: Mood appropriate.         Medications:   Medications:    potassium chloride   40 mEq Oral BID WC    ferrous sulfate   325 mg Oral Daily with breakfast    insulin  glargine  40 Units SubCUTAneous Nightly    levothyroxine   25 mcg Oral Daily    losartan   25 mg Oral Daily    [Held by provider] atorvastatin   20 mg Oral Nightly    sodium chloride  flush  5-40 mL IntraVENous 2 times per day    enoxaparin   40 mg SubCUTAneous Daily    sodium chloride  flush  5-40 mL IntraVENous 2 times per day    insulin  lispro  0-8 Units SubCUTAneous 4x Daily AC & HS    DAPTOmycin  IV orderable  500 mg IntraVENous Q24H      Infusions:    sodium chloride       sodium chloride       dextrose        PRN Meds: sodium chloride  flush, 5-40 mL, PRN  sodium chloride , , PRN  potassium chloride , 40 mEq, PRN   Or  potassium alternative oral replacement, 40 mEq, PRN   Or  potassium chloride , 10 mEq, PRN  magnesium  sulfate, 2,000 mg, PRN  ondansetron , 4 mg, Q8H PRN   Or  ondansetron , 4 mg, Q6H PRN  polyethylene glycol, 17 g, Daily PRN  acetaminophen , 650 mg, Q6H PRN   Or  acetaminophen , 650 mg, Q6H PRN  sodium chloride  flush, 5-40 mL, PRN  sodium chloride , , PRN  glucose, 4 tablet, PRN  dextrose  bolus, 125 mL, PRN   Or  dextrose  bolus, 250 mL,  PRN  glucagon , 1 mg, PRN  dextrose , , Continuous PRN        Labs and Imaging   XR HIP 2-3 VW W PELVIS LEFT  Result Date: 05/03/2024  XR HIP 2-3 VW W PELVIS LEFT Indication: Left hip pain. History trochanteric bursitis. COMPARISON: 03/31/2024 Findings: Frontal view the pelvis and frog-leg lateral view of the left hip were obtained. The bones are well-mineralized. There is no acute fracture or dislocation. The regional soft tissues are unremarkable.     Impression: No acute osseous abnormality. Electronically signed by Belvie Plana, MD    XR CHEST PORTABLE  Result Date: 05/03/2024  XR CHEST PORTABLE Indication: Cough COMPARISON: 03/27/2024 Findings: Portable AP upright view of the chest was obtained. The heart is normal in size and configuration. The lungs are clear. There is no effusion or pneumothorax.     Impression: No acute cardiopulmonary abnormality. Electronically signed by Belvie Plana, MD      CBC:   Recent Labs     05/03/24  1417 05/04/24  0507   WBC 12.3* 10.8   HGB 10.1* 10.2*   PLT 362 360     BMP:    Recent Labs     05/03/24  1417 05/04/24  0507   NA 137 142   K 3.7 2.9*   CL 101 104   CO2 25 26   BUN 6* 5*   CREATININE 0.7 0.6   GLUCOSE 274* 125*     Hepatic:   Recent Labs     05/03/24  1417 05/04/24  0507   AST 11* 10*   ALT 6* <5*   BILITOT 0.6 0.6   ALKPHOS 156* 152*     Lipids:   Lab Results   Component Value Date/Time    CHOL 154 11/25/2023 02:44 PM    HDL 22 11/25/2023 02:44 PM    TRIG 170 11/25/2023 02:44 PM     Hemoglobin A1C:   Lab Results   Component Value Date/Time    LABA1C 13.9 03/27/2024 03:47  AM     TSH:   Lab Results   Component Value Date/Time    TSH 1.92 08/27/2022 04:35 PM     Troponin: No results found for: TROPONINT  Lactic Acid: No results for input(s): LACTA in the last 72 hours.  BNP: No results for input(s): PROBNP in the last 72 hours.  UA:  Lab Results   Component Value Date/Time    NITRU Negative 05/03/2024 04:37 PM    COLORU Yellow 05/03/2024 04:37 PM    PHUR 7.0  05/03/2024 04:37 PM    PHUR 5.5 02/25/2017 12:22 PM    WBCUA 10-20 05/03/2024 04:37 PM    RBCUA 51-100 05/03/2024 04:37 PM    TRICHOMONAS Present 05/03/2024 04:37 PM    BACTERIA 3+ 03/28/2024 02:03 AM    CLARITYU Clear 05/03/2024 04:37 PM    LEUKOCYTESUR SMALL 05/03/2024 04:37 PM    UROBILINOGEN 0.2 05/03/2024 04:37 PM    BILIRUBINUR Negative 05/03/2024 04:37 PM    BLOODU LARGE 05/03/2024 04:37 PM    GLUCOSEU 100 05/03/2024 04:37 PM    KETUA Negative 05/03/2024 04:37 PM     Urine Cultures:   Lab Results   Component Value Date/Time    LABURIN 25,000 CFU/ml  PBP2= Negative   03/28/2024 01:54 AM     Blood Cultures:   Lab Results   Component Value Date/Time    BC No Growth after 4 days of incubation. 04/04/2024 12:16 PM     Lab Results   Component Value Date/Time    BLOODCULT2 No Growth after 4 days of incubation. 03/30/2024 04:21 PM     Organism:   Lab Results   Component Value Date/Time    ORG Staphylococcus aureus 04/02/2024 02:09 PM         Electronically signed by Sheree CHRISTELLA Evern Marylyn, MD on 05/04/2024 at 9:58 AM

## 2024-05-04 NOTE — Plan of Care (Signed)
 Problem: Chronic Conditions and Co-morbidities  Goal: Patient's chronic conditions and co-morbidity symptoms are monitored and maintained or improved  Outcome: Progressing  Flowsheets (Taken 05/03/2024 2017 by Wango, Victorine, RN)  Care Plan - Patient's Chronic Conditions and Co-Morbidity Symptoms are Monitored and Maintained or Improved:   Monitor and assess patient's chronic conditions and comorbid symptoms for stability, deterioration, or improvement   Collaborate with multidisciplinary team to address chronic and comorbid conditions and prevent exacerbation or deterioration   Update acute care plan with appropriate goals if chronic or comorbid symptoms are exacerbated and prevent overall improvement and discharge     Problem: Discharge Planning  Goal: Discharge to home or other facility with appropriate resources  Outcome: Progressing  Flowsheets (Taken 05/03/2024 2319 by Wango, Victorine, RN)  Discharge to home or other facility with appropriate resources: Identify barriers to discharge with patient and caregiver     Problem: Pain  Goal: Verbalizes/displays adequate comfort level or baseline comfort level  Outcome: Progressing  Flowsheets (Taken 05/04/2024 0429 by Wango, Victorine, RN)  Verbalizes/displays adequate comfort level or baseline comfort level: Encourage patient to monitor pain and request assistance  Note: Patient has not expressed any pain.     Problem: Safety - Adult  Goal: Free from fall injury  Outcome: Progressing  Flowsheets (Taken 05/03/2024 2306 by Wango, Victorine, RN)  Free From Fall Injury: Instruct family/caregiver on patient safety

## 2024-05-04 NOTE — Care Coordination (Addendum)
 Case Management Assessment  Initial Evaluation    Date/Time of Evaluation: 05/04/2024 2:28 PM  Assessment Completed by: Clarita LITTIE Search, RN    If patient is discharged prior to next notation, then this note serves as note for discharge by case management.    Patient Name: Shelly Richard                   Date of Birth: [birth year]  Diagnosis: Sepsis (HCC) [A41.9]  Sepsis without acute organ dysfunction, due to unspecified organism [facility]) [A41.9]                   Date / Time: 05/03/2024  1:19 PM    Patient Admission Status: Inpatient   Readmission Risk (Low < 19, Mod (19-27), High > 27): Readmission Risk Score: 19.2    Current PCP: Alexandro Bernice BRAVO, [name]  PCP verified by CM? Yes    Chart Reviewed: Yes      History Provided by: Patient  Patient Orientation: Alert, Oriented, Person, Place, Situation    Patient Cognition: No Apparent Deficit    Hospitalization in the last 30 days (Readmission):  Yes    If yes, Readmission Assessment in CM Navigator will be completed.    Advance Directives:      Code Status: Full Code   Patient's Primary Decision Maker is: Legal Next of Kin      Discharge Planning:    Patient lives with:   Type of Home: House, Other (Comment) (town house)  Primary Care Giver:    Patient Support Systems include: Children   Current Financial resources:    Current community resources:    Current services prior to admission: None            Current DME:              Type of Home Care services:       ADLS  Prior functional level: Independent in ADLs/IADLs  Current functional level: Independent in ADLs/IADLs    PT AM-PAC:   /24  OT AM-PAC:   /24    Family can provide assistance at DC: Yes  Would you like Case Management to discuss the discharge plan with any other family members/significant others, and if so, who?    Plans to Return to Present Housing: Yes  Other Identified Issues/Barriers to RETURNING to current housing: s/s of infection  Potential Assistance needed at discharge: N/A             Potential DME:    Patient expects to discharge to: Home  Plan for transportation at discharge:      Financial    Payor: [place] BCBS / Plan: [place] - [place] PPO / Product Type: *No Product type* /     Does insurance require precert for SNF: Yes    Potential assistance Purchasing Medications: No  Meds-to-Beds request:        [facility] [id number] - [place], [place] - [name] [phone] - F [phone]  [address] [place] [address]  Phone: [phone] Fax: [phone]      Notes:    Factors facilitating achievement of predicted outcomes: Cooperative and Pleasant    Barriers to discharge: Medical complications, Skin Care, and Medication managment    Additional Case Management Notes: CM met with pt at bedside. Pt has getting IV abx through the infusion center due to no insurance, pt now has insurance and was getting IV abx at home. Pt  is IPTA and plans to return home at discharge. ID following. If still needs IV abx at discharge will resume services if not, pt is IPTA and states her boyfriend will transport her home. Pt is active with [facility] and [facility].     The Plan for Transition of Care is related to the following treatment goals of Sepsis (HCC) [A41.9]  Sepsis without acute organ dysfunction, due to unspecified organism (HCC) [A41.9]    IF APPLICABLE: The Patient and/or patient representative Shelly Richard and her family were provided with a choice of provider and agrees with the discharge plan. Freedom of choice list with basic dialogue that supports the patient's individualized plan of care/goals and shares the quality data associated with the providers was provided to:     Patient Representative Name:       The Patient and/or Patient Representative Agree with the Discharge Plan?      Clarita LITTIE Search, RN  Case Management Department  Ph: [phone] Fax: [phone]

## 2024-05-04 NOTE — Plan of Care (Signed)
 Problem: Chronic Conditions and Co-morbidities  Goal: Patient's chronic conditions and co-morbidity symptoms are monitored and maintained or improved  05/04/2024 2335 by Marlee Armenteros, RN  Outcome: Progressing     Problem: Discharge Planning  Goal: Discharge to home or other facility with appropriate resources  05/04/2024 2335 by Erion Hermans, RN  Outcome: Progressing  Flowsheets (Taken 05/04/2024 1954)  Discharge to home or other facility with appropriate resources: Identify barriers to discharge with patient and caregiver     Problem: Pain  Goal: Verbalizes/displays adequate comfort level or baseline comfort level  05/04/2024 2335 by Annalie Wenner, RN  Outcome: Progressing  Flowsheets (Taken 05/04/2024 1954)  Verbalizes/displays adequate comfort level or baseline comfort level:   Encourage patient to monitor pain and request assistance   Assess pain using appropriate pain scale   Administer analgesics based on type and severity of pain and evaluate response   Implement non-pharmacological measures as appropriate and evaluate response   Consider cultural and social influences on pain and pain management   Notify Licensed Independent Practitioner if interventions unsuccessful or patient reports new pain     Problem: Safety - Adult  Goal: Free from fall injury  05/04/2024 2335 by Celes Dedic, RN  Outcome: Progressing

## 2024-05-04 NOTE — Progress Notes (Signed)
 Returned from MRI

## 2024-05-04 NOTE — Progress Notes (Signed)
 Patient down to MRI.

## 2024-05-05 ENCOUNTER — Inpatient Hospital Stay: Admit: 2024-05-06 | Payer: BLUE CROSS/BLUE SHIELD | Primary: Oncology

## 2024-05-05 LAB — BASIC METABOLIC PANEL
Anion Gap: 12 (ref 3–16)
BUN: 6 mg/dL — ABNORMAL LOW (ref 7–20)
CO2: 27 mmol/L (ref 21–32)
Calcium: 9.2 mg/dL (ref 8.3–10.6)
Chloride: 101 mmol/L (ref 99–110)
Creatinine: 0.6 mg/dL (ref 0.6–1.1)
Est, Glom Filt Rate: >90
Glucose: 153 mg/dL — ABNORMAL HIGH (ref 70–99)
Potassium: 3.1 mmol/L — ABNORMAL LOW (ref 3.5–5.1)
Sodium: 140 mmol/L (ref 136–145)

## 2024-05-05 LAB — POCT GLUCOSE
POC Glucose: 179 mg/dL — ABNORMAL HIGH (ref 70–99)
POC Glucose: 159 mg/dL — ABNORMAL HIGH (ref 70–99)
POC Glucose: 141 mg/dL — ABNORMAL HIGH (ref 70–99)
POC Glucose: 166 mg/dL — ABNORMAL HIGH (ref 70–99)

## 2024-05-05 LAB — CBC WITH AUTO DIFFERENTIAL
Basophils %: 0.5 %
Basophils Absolute: 0.1 10*3/uL (ref 0.0–0.2)
Eosinophils %: 1.5 %
Eosinophils Absolute: 0.2 10*3/uL (ref 0.0–0.6)
Hematocrit: 27.7 % — ABNORMAL LOW (ref 36.0–48.0)
Hemoglobin: 9.7 g/dL — ABNORMAL LOW (ref 12.0–16.0)
Lymphocytes %: 22.9 %
Lymphocytes Absolute: 2.4 10*3/uL (ref 1.0–5.1)
MCH: 27.1 pg (ref 26.0–34.0)
MCHC: 34.9 g/dL (ref 31.0–36.0)
MCV: 77.8 fL — ABNORMAL LOW (ref 80.0–100.0)
MPV: 8.6 fL (ref 5.0–10.5)
Monocytes %: 7.3 %
Monocytes Absolute: 0.8 10*3/uL (ref 0.0–1.3)
Neutrophils %: 67.8 %
Neutrophils Absolute: 7.2 10*3/uL (ref 1.7–7.7)
Platelets: 376 10*3/uL (ref 135–450)
RBC: 3.56 M/uL — ABNORMAL LOW (ref 4.00–5.20)
RDW: 16 % — ABNORMAL HIGH (ref 12.4–15.4)
WBC: 10.7 10*3/uL (ref 4.0–11.0)

## 2024-05-05 LAB — HEMOGLOBIN A1C
Estimated Avg Glucose: 197.3 mg/dL
Hemoglobin A1C: 8.5 %

## 2024-05-05 LAB — CULTURE, BLOOD, PCR ID PANEL RESULTS REPORT

## 2024-05-05 MED ORDER — CEFAZOLIN SODIUM 1 G IJ SOLR
1 | Freq: Three times a day (TID) | INTRAMUSCULAR | Status: DC
Start: 2024-05-05 — End: 2024-05-11
  Administered 2024-05-05 – 2024-05-11 (×20): 2000 mg via INTRAVENOUS

## 2024-05-05 MED FILL — STERILE WATER FOR INJECTION IJ SOLN: INTRAMUSCULAR | Qty: 20 | Fill #0

## 2024-05-05 MED FILL — LEVOTHYROXINE SODIUM 25 MCG PO TABS: 25 ug | ORAL | Qty: 1 | Fill #0

## 2024-05-05 MED FILL — CEFAZOLIN SODIUM 1 G IJ SOLR: 1 g | INTRAMUSCULAR | Qty: 2000 | Fill #0

## 2024-05-05 MED FILL — ENOXAPARIN SODIUM 40 MG/0.4ML IJ SOSY: 40 MG/0.4ML | INTRAMUSCULAR | Qty: 0.4 | Fill #0

## 2024-05-05 MED FILL — FERROUS SULFATE 325 (65 FE) MG PO TABS: 325 (65 Fe) MG | ORAL | Qty: 1 | Fill #0

## 2024-05-05 MED FILL — LOSARTAN POTASSIUM 25 MG PO TABS: 25 mg | ORAL | Qty: 1 | Fill #0

## 2024-05-05 MED FILL — LANTUS 100 UNIT/ML SC SOLN: 100 [IU]/mL | SUBCUTANEOUS | Qty: 40 | Fill #0

## 2024-05-05 NOTE — Progress Notes (Signed)
 Pt return from MRI

## 2024-05-05 NOTE — Progress Notes (Signed)
 Vascular Access Team called to assist with PIV.  See Flowsheet for USG PIV information, below is ultrasound evaluation of vein:

## 2024-05-05 NOTE — Progress Notes (Signed)
 Peripheral IV established by PICC nurse. PICC line removed and tip taken to lab for culture. Dry occlusive dressing placed at site. No drainage noted.

## 2024-05-05 NOTE — Care Coordination (Signed)
 DISCHARGE PLANNING:    Chart Reviewed:    Admitted with Sepsis:     -   Pt is IPTA and plans to return home at discharge. Has PICC line.     Active  with  Home Infusion and HHC :    [address] [facility]  [address], [place], [place] [address]  [phone]  [phone]    [facility] - [address]  [address] [place], [place] [address]  [phone]  [phone]    -  ID following. If still needs IV abx at discharge will resume services if not, pt is IPTA and states her boyfriend will transport her home.  -   Pt is active with [facility] and [facility].      The Plan for Transition of Care is related to the following treatment goals of Sepsis (HCC) [A41.9]  Sepsis without acute organ dysfunction, due to unspecified organism [facility]) [A41.9]    CM cont to follow     Electronically signed by Darice Croft, RN on 05/05/2024 at 12:48 PM       Darice Croft  RN Case Manager  [facility]  [address]  [place] [address]  [phone]  Fax [phone]

## 2024-05-05 NOTE — Consults (Signed)
 Consult received, patient known to me from prior admission and left hip greater troch bursa I&D. Had been healing well, seen back in office as outpt, sutures removed, ambulating well with some modest occasional discomfort.    Recently readmitted due to tachycardia. Found to have positive Blood CX. MRI showing fluid collection along trochanteric bursa and along gluteus.    Exam shows benign healed incision, no erythema, no warmth, full WBAT, good ROM    Discussed with patient that her situation is unusual and with positive blood cultures I cannot assume these are simple postop changes.    Given her unusual presentation and imaging concerns I will involve another colleague with adult recon colleague for added expertise and management thoughts.      Shelly GRETTA BIDDING, MD  [facility]  [phone] (cell)

## 2024-05-05 NOTE — Progress Notes (Signed)
 ID Follow-up NOTE    CC:   Fever, tachycardia   Antibiotics: Daptomycin      Admit Date: 05/03/2024  Hospital Day: 3    Subjective:     Patient has been afebrile, continues to have left hip discomfort. Denies any other new complaints. Bcx now positive for MSSA.  Daughter at bedside      Objective:     Patient Vitals for the past 8 hrs:   BP Temp Temp src Pulse Resp SpO2   05/05/24 0948 131/83 98.8 F (37.1 C) Oral 93 16 98 %   05/05/24 0607 134/74 99.1 F (37.3 C) Oral (!) 110 18 97 %     I/O last 3 completed shifts:  In: 2046.7 [P.O.:420; I.V.:926.6; IV Piggyback:700.1]  Out: 6 [Urine:6]  No intake/output data recorded.    EXAM:  GENERAL: No apparent distress.    HEENT: Membranes moist, no oral lesion  NECK:  Supple, no lymphadenopathy  LUNGS: Clear b/l, no rales, no dullness  CARDIAC: RRR  ABD:  + BS, soft / NT  EXT:   Left hip incision is well-healed without any surrounding induration or redness evident, slight tenderness to palpation in all weightbearing  NEURO: No focal neurologic findings  PSYCH: Orientation, sensorium, mood normal  LINES:  Peripheral iv, PICC line in place without any induration at insertion site.        Data Review:  Lab Results   Component Value Date    WBC 10.7 05/05/2024    HGB 9.7 (L) 05/05/2024    HCT 27.7 (L) 05/05/2024    MCV 77.8 (L) 05/05/2024    PLT 376 05/05/2024     Lab Results   Component Value Date    CREATININE 0.6 05/05/2024    BUN 6 (L) 05/05/2024    NA 140 05/05/2024    K 3.1 (L) 05/05/2024    CL 101 05/05/2024    CO2 27 05/05/2024       Hepatic Function Panel:   Lab Results   Component Value Date/Time    ALKPHOS 152 05/04/2024 05:07 AM    ALT <5 05/04/2024 05:07 AM    AST 10 05/04/2024 05:07 AM    BILITOT 0.6 05/04/2024 05:07 AM    BILIDIR 0.2 05/03/2024 02:17 PM    IBILI 0.4 05/03/2024 02:17 PM       MICRO:  Blood cultures x 2 1/19: 1 set positive for MSSA per PCR     Blood cultures x 2 12/21: Neg      OR culture left hip bursa 12/19: Rare growth Staph aureus, MSSA      Blood cultures x 2 12/16: No growth     Left hip bursa aspirate culture 12/18: Moderate growth MSSA     Blood cultures x 2 12/15: Positive for MSSA     Blood cultures x 2 12/13: Positive for MSSA  Staphylococcus aureus (2)     Antibiotic Interpretation MIC   Method Status     clindamycin  Sensitive <=0.25 mcg/mL BACTERIAL SUSCEPTIBILITY PANEL BY MIC       erythromycin  Sensitive <=0.25 mcg/mL BACTERIAL SUSCEPTIBILITY PANEL BY MIC       levofloxacin Resistant 4 mcg/mL BACTERIAL SUSCEPTIBILITY PANEL BY MIC       linezolid Sensitive 2 mcg/mL BACTERIAL SUSCEPTIBILITY PANEL BY MIC       oxacillin Sensitive 0.5 mcg/mL BACTERIAL SUSCEPTIBILITY PANEL BY MIC       tetracycline Sensitive <=1 mcg/mL BACTERIAL SUSCEPTIBILITY PANEL BY MIC  Tetracyclines have decreased renal elimination and  urinary bladder concentrations.  Use of this class of  agents for urinary tract infection should be limited to  patients that cannot tolerate an alternative therapy.           trimethoprim-sulfamethoxazole Sensitive <=10 mcg/mL BACTERIAL SUSCEPTIBILITY PANEL BY MIC       vancomycin  Sensitive 1 mcg/mL BACTERIAL SUSCEPTIBILITY PANEL BY MIC          Urine culture 12/14: 25,000 CFU MSSA      IMAGING: I have independently reviewed the images and reports.       MRI left hip with and without contrast 1/20:  1. Persistent fasciitis, bursitis, myositis and abscess formation along the gluteus medius, the lateral upper thigh musculature fascia, and along the greater trochanteric bursa. The abscess collection has decreased in size. The extent of inflammation   appears to be mildly improved in comparison to the prior exam.     Portable chest x-ray 1/19:  No acute cardiopulmonary abnormality     X-ray left hip and pelvis 1/19:  No acute osseous abnormality.     MRI left hip with and without contrast 12/17:  1. There is a 4.6 cm left greater trochanter bursal fluid collection with thin tract which extends superiorly into the left gluteus medius  muscle. There is extensive surrounding edema as well as edema within the left gluteus medius muscle, and lateral   aspect of the adjacent left gluteus maximus muscle. In the setting of infection this is most worrisome for a septic bursitis-abscess.  2. No evidence of any osteomyelitis.  3. No acute osseous abnormality.        Scheduled Meds:   ceFAZolin   2,000 mg IntraVENous q8h    ferrous sulfate   325 mg Oral Daily with breakfast    insulin  glargine  40 Units SubCUTAneous Nightly    levothyroxine   25 mcg Oral Daily    losartan   25 mg Oral Daily    [Held by provider] atorvastatin   20 mg Oral Nightly    sodium chloride  flush  5-40 mL IntraVENous 2 times per day    enoxaparin   40 mg SubCUTAneous Daily    sodium chloride  flush  5-40 mL IntraVENous 2 times per day    insulin  lispro  0-8 Units SubCUTAneous 4x Daily AC & HS       Continuous Infusions:   sodium chloride       sodium chloride       dextrose          PRN Meds:  sodium chloride  flush, sodium chloride , magnesium  sulfate, ondansetron  **OR** ondansetron , polyethylene glycol, acetaminophen  **OR** acetaminophen , sodium chloride  flush, sodium chloride , glucose, dextrose  bolus **OR** dextrose  bolus, glucagon , dextrose       Assessment:       Patient Active Problem List   Diagnosis    Mixed hyperlipidemia    Iron  deficiency anemia    Type 2 diabetes mellitus with hyperglycemia, with long-term current use of insulin  (HCC)    Essential hypertension    Acquired hypothyroidism    Mild intermittent asthma without complication    Allergic rhinitis    Heart murmur    Uterine fibroid    Class 1 obesity due to excess calories with serious comorbidity and body mass index (BMI) of 31.0 to 31.9 in adult    Abnormal uterine bleeding    Eczema    Glaucoma suspect, bilateral    Hidradenitis axillaris    HSV-2 infection    Myopic astigmatism, bilateral  Strabismus    S/P myomectomy    DKA, type 2, not at goal [facility])    Acute nonintractable headache    MSSA bacteremia    Diabetic  ketoacidosis without coma associated with type 2 diabetes mellitus (HCC)    Greater trochanteric bursitis of left hip    Abscess of bursa of left hip    Sepsis (HCC)    Type 2 diabetes mellitus with other specified complication [facility])        Plan:   39 year old African-American female with history of DM2, HTN, HLD that presented on 03/27/24 with headache and shortness of breath. She was found to be in DKA and was admitted to the ICU.  During that admission she was also found to have MSSA bacteremia and left hip greater trochanteric bursitis. She is on IV abx course with daptomycin  and here with fevers and tachycardia     MSSA bacteremia and left hip greater trochanteric bursitis:  - hx of recent admission here on 03/2024 in the ICU with DKA, was also found to have MSSA bacteremia with left hip trochanteric greater bursitis  - previous MRI hip on  03/31/2024 showed 4.6cm left greater trochanteric bursal fluid collection.  This was aspirated with IR and yielded growth of MSSA.    - orthopedics performed operative washout of the bursa on 04/02/24 with operative cultures showing growth of the same.  -Patient has been on a course of IV daptomycin  now doing this at home.  We initially had to choose a once a day antibiotic as patient was receiving charity care via [facility] and therefore was DC'd on daptomycin . This was initially planned to end on 1/16 however due to persistently elevated inflammatory markers, was extended to 1/30, and had planned OP repeat MRI hip.   -Now admitted on 1/19 with fever and tachycardia  - WBC was 12.3 and Tmax of 101.8.  ESR/CRP was 94/126 respectively.  She has been resumed on daptomycin   - Blood cultures x 2 taken on admission from 1/19 now with one set positive for MSSA.    -Patient denies any new complaints besides left hip tenderness, states slight pain on bearing weight and sometimes gives out.    - MRI left hip on this admission showing persistent fasciitis, bursitis, myositis and abscess  formation along the gluteus medius, the lateral upper thigh musculature fascia, and along the greater trochanteric bursa. The abscess collection has decreased in size.   - ortho eval pending.   - with positive Bcx here and isolation of MSSA, will stop dapto and place on cefazolin  to have targeted MSSA therapy.   - persistence of bacteremia concerning for source control issue.  If further orthopedic intervention is performed, would appreciate cultures to be obtained at that time.  -With ongoing bacteremia and presence of PICC line, would recommend removal of PICC line and culture tip.  Obtain repeat sets of blood cultures and hold off on any central line placement until repeat sets of blood cultures remain negative.  Will also repeat TTE     DM2:  - Good glycemic control to aid in healing and prevention of further infections.  - A1c on last admission was 13.9, consider recheck  - Patient previously lost her insurance and was not on any diabetic medications, this has been resumed since the last admission.      Medical Decision Making:  The following items were considered in medical decision making:  Discussion of patient care with other providers  Reviewed clinical lab  tests  Reviewed radiology tests  Reviewed other diagnostic tests/interventions  Independent review of radiologic images  Microbiology cultures and other micro tests reviewed      Please note that this chart was generated using Dragon dictation software. Although every effort was made to ensure the accuracy of this automated transcription, some errors in transcription may have occurred inadvertently.  Any pictures or media included in this note were obtained after taking informed verbal consent from the patient and with their approval to include those in the patient's medical record.     Discussed with patient, social work and primary team, Dr. Evern.  Taivon Haroon, MD

## 2024-05-05 NOTE — Progress Notes (Signed)
 Pt to MRI

## 2024-05-05 NOTE — Consults (Signed)
 Consultant: Lamar Carrier, MD  From: Dr. Sherline  Reason: Hx of L hip troch bursitis     Chief Complaint   Patient presents with    Tachycardia     Pt presents to the ED due to tachycardia. Pt states she is being treated for a Staph infection and receives antibiotics through her PICC line.         History of Present Illness      Shelly Richard is a 39 y.o. female who presented to [facility] with tachycardia, has had fevers. Had I&D of septic troch bursitis associated with DKA 1 mo ago, has been improving well. Does note that the leg will occasionally give out on her. L posterior hip soreness is not too severe and overall continues to improve.     Now with fevers, tachycardia which have improved. She has been switched from dapto to ancef  per ID and PICC tip cultures obtained and PICC exchanged.         Past History       Past Medical History:   Diagnosis Date    Acute headache     Allergies     Asthma     Diabetes mellitus (HCC)     Genital herpes     HTN (hypertension)     Hyperlipidemia     Hypothyroid     Urticaria      Past Surgical History:   Procedure Laterality Date    CT FLUID COLLECTION DRAINAGE SOFT TISSUE  04/01/2024    CT FLUID COLLECTION DRAINAGE SOFT TISSUE 04/01/2024 [facility] CT SCAN    CYST REMOVAL      HIP SURGERY Left 04/02/2024    INCISION AND DRAINAGE OF LEFT HIP GREATER TROCHANTERIC BURSA performed by Sherline Lamar Gaskins, MD at [facility] OR    HYSTEROSCOPY      x2    MYOMECTOMY N/A 02/01/2022    ROBOTIC MYOMECTOMY performed by Foley, Chauncey Sharper, MD at [facility] OR     Family History   Problem Relation Age of Onset    Diabetes Mother     Diabetes Father      Social History     Tobacco Use    Smoking status: Never    Smokeless tobacco: Never   Substance Use Topics    Alcohol use: Never      No current facility-administered medications on file prior to encounter.     Current Outpatient Medications on File Prior to Encounter   Medication Sig Dispense Refill    DAPTOmycin  (CUBICIN ) infusion Infuse 400 mg  intravenously in the morning for 11 days. Compound per protocol. 5 g 0    fluconazole  (DIFLUCAN ) 150 MG tablet Take 1 tablet by mouth every 3 days 3 tablet 0    levothyroxine  (SYNTHROID ) 25 MCG tablet Take 1 tablet by mouth Daily 30 tablet 3    simvastatin  (ZOCOR ) 40 MG tablet Take 1 tablet by mouth daily 90 tablet 1    losartan  (COZAAR ) 25 MG tablet TAKE ONE TABLET BY MOUTH DAILY 90 tablet 1    Semaglutide , 1 MG/DOSE, (OZEMPIC , 1 MG/DOSE,) 4 MG/3ML SOPN sc injection Inject 1 mg into the skin every 7 days 6 mL 1    metFORMIN  (GLUCOPHAGE -XR) 500 MG extended release tablet Take 4 tablets by mouth daily (with breakfast) 360 tablet 1    ferrous sulfate  (FEROSUL) 325 (65 Fe) MG tablet Take 1 tablet by mouth daily (with breakfast) 90 tablet 1    cetirizine  (ZYRTEC ) 10 MG tablet Take  1 tablet by mouth daily 90 tablet 1    montelukast  (SINGULAIR ) 10 MG tablet Take 1 tablet by mouth daily 90 tablet 1    Cholecalciferol (VITAMIN D3) 50 MCG (2000 UT) TABS TAKE ONE TABLET BY MOUTH DAILY 90 tablet 3    albuterol  sulfate HFA (PROVENTIL ;VENTOLIN ;PROAIR ) 108 (90 Base) MCG/ACT inhaler Inhale 2 puffs into the lungs every 6 hours as needed for Wheezing or Shortness of Breath 1 each 5    insulin  glargine (LANTUS  SOLOSTAR) 100 UNIT/ML injection pen Inject 40 Units into the skin nightly 12 mL 3    insulin  lispro, 1 Unit Dial , (HUMALOG  KWIKPEN) 100 UNIT/ML SOPN Inject 10 Units into the skin 3 times daily (before meals) INJECT 10 UNITS UNDER THE SKIN three times WITH MEALS PLUS INJECT 2 UNITS FOR BLOOD SUGAR 150-200, 4 UNITS IF 201-250, 6 UNITS IF 251-300, 8 UNITS IF 301-350, 10 UNITS IF GREATER THAN 350. 9 mL 3    aspirin  81 MG EC tablet Take 1 tablet by mouth in the morning and at bedtime 30 tablet 3    blood glucose monitor strips Tes2t qam  times a day & as needed for symptoms of irregular blood glucose. Please provide test strips approved by insurance. 100 strip 2    Continuous Glucose Sensor (FREESTYLE LIBRE 3 PLUS SENSOR) MISC Apply  every 15 days (Patient not taking: Reported on 05/03/2024) 6 each 1    Insulin  Pen Needle (DROPLET PEN NEEDLES) 32G X 6 MM MISC USE ONCE DAILY (Patient not taking: Reported on 03/27/2024) 100 each 3    ibuprofen  (ADVIL ;MOTRIN ) 600 MG tablet Take 1 tablet by mouth every 6 hours as needed for Pain 120 tablet 0    acetaminophen  (TYLENOL ) 500 MG tablet Take 1 tablet by mouth 4 times daily as needed for Pain 120 tablet 0    Insulin  Pen Needle 32G X 6 MM MISC Use four times daily and as needed (Patient not taking: Reported on 03/27/2024) 100 each 5    EPINEPHrine  (EPIPEN ) 0.3 MG/0.3ML SOAJ injection  (Patient not taking: Reported on 03/27/2024)      Insulin  Pen Needle ([employer] PEN NEEDLES 31G) 31G X 8 MM MISC 1 each by Does not apply route daily (Patient not taking: Reported on 03/27/2024) 100 each 3    Lancets MISC 1 each by Does not apply route daily Please provide lancets approved by insurance. (Patient not taking: Reported on 03/27/2024) 100 each 3    Blood Glucose Monitoring Suppl (ACURA BLOOD GLUCOSE METER) w/Device KIT Please provide meter approved by insurance.  Check bs qam 1 kit 0      Lisinopril, Peanuts [peanut oil], Walnut, and Ace inhibitors    Review of Systems      10-point ROS is negative other than what's mentioned in HPI.    Physical Exam        BP (!) 145/88   Pulse (!) 107   Temp 98.6 F (37 C) (Oral)   Resp 16   Ht 1.626 m (5' 4)   Wt 75.5 kg (166 lb 6.4 oz)   LMP 03/23/2024   SpO2 98%   BMI 28.56 kg/m      Constitutional:       General: He is not in acute distress.     Appearance: Normal appearance.   Cardiovascular:      Rate and Rhythm: Normal rate and regular rhythm.      Pulses: Normal pulses.   Pulmonary:      Effort: Pulmonary effort  is normal. No respiratory distress.   Neurological:      Mental Status: He is alert and oriented to person, place, and time. Mental status is at baseline.     Musculoskeletal:  LLE exam with 4/5 hip abduction and knee extension compared to the other side.  L hip incision well healed. No swelling erythema or focal TTP including over greater troch. Otherwise NVID.     Imaging       Radiographs:     MRI L hip with decreased GT fluid collection including the glut med    Labs: WBC 10.7, hgb 9.7, CRP 126, ESR 94     Assessment and Plan      39 y/o F 1 mo s/p L troch bursa I&D for septic troch bursitis, uncontrolled DM. LLE weakness. Decreased but continued fluid collection L hip.     -L hip exam very benign, non tender, weak abduction and some weakness with knee extension also. She reports persistent neck and back pain as well. Around the time of her sepsis.     Recommend MRI of the spine to screen for discitis etc, also certainly possible this is explained by PICC line infection and await the tip cultures. We will await this work up. No plan for repeat surgical intervention to the L hip at this time. Will follow     Electronically signed by Lamar Carrier, MD on 05/05/2024 at 6:25 PM  This dictation was generated by voice recognition computer software.  Although all attempts are made to edit the dictation for accuracy, there may be errors in the transcription that are not intended.

## 2024-05-05 NOTE — Progress Notes (Signed)
 V2.0    USACS Progress Note      Name:  Shelly Richard DOB/Age/Sex: 01-25-1986  (39 y.o. female)   MRN & CSN:  [id number] & [id number] Encounter Date/Time: 05/05/2024 9:58 AM EST   Location:  6305/6305-01 PCP: Alexandro Bernice BRAVO, [name]     Attending:[facility]rig[facility] Day: 3    Assessment and Recommendations     39 year old female with a history of uncontrolled diabetes mellitus with last A1c of 13.9, recent hospitalization for DKA which was complicated by left hip bursitis requiring I&D and long-term IV antibiotics presenting from home with tachycardia, fever spikes.  Concern for recurrence of left hip bursitis.  Currently with MRI of the left hip showing persistent fasciitis, bursitis, ascites and abscess formation along the gluteus medius, lateral upper thigh musculature fossa, greater trochanteric bursa.  Blood cultures are growing MSSA.    Fever  Possible sepsis secondary to left hip bursitis  MSSA bacteremia  - Follow-up Ortho consult.  -Continue IV Ancef   - ID consult.  Discussed with ID  -Continue to follow blood cultures.  Should get them repeated.  - TTE as per ID recs    Hypothyroidism  - Continue Synthroid .    Uncontrolled diabetes mellitus 13.9% last A1c  - Recheck A1c better control he 8.5%.  - Continue Lantus  40 units nightly.  Continue sliding scale as ordered.    Hypertension  - Continue losartan     Microcytic anemia  - Continue iron  supplementation.        Diet ADULT DIET; Regular; 4 carb choices (60 gm/meal)   DVT Prophylaxis [x]  Lovenox , []   Heparin, []  SCDs, []  Ambulation,  []  Eliquis, []  Xarelto  []  Coumadin   Code Status Full Code   Disposition From: Home  Expected Disposition: tbd  Estimated Date of Discharge: tbd  Patient requires continued admission due to Sepsis   Surrogate Decision Maker/ POA  [name] listed as primary contact.      Personally reviewed Lab Studies and Imaging     Discussed management of the case with case management.  I  reviewed IDs consult note.        Subjective:     Patient seen and evaluated at bedside.  Feels okay.      Review of Systems:      Pertinent positives and negatives discussed in HPI    Objective:     Intake/Output Summary (Last 24 hours) at 05/05/2024 1424  Last data filed at 05/05/2024 0305  Gross per 24 hour   Intake 1220.16 ml   Output --   Net 1220.16 ml      Vitals:   Vitals:    05/04/24 1954 05/05/24 0003 05/05/24 0607 05/05/24 0948   BP: (!) 147/89 (!) 136/91 134/74 131/83   Pulse: (!) 109 (!) 110 (!) 110 93   Resp: 16 16 18 16    Temp: 99.5 F (37.5 C) 99.3 F (37.4 C) 99.1 F (37.3 C) 98.8 F (37.1 C)   TempSrc: Oral Oral Oral Oral   SpO2: 99% 98% 97% 98%   Weight:       Height:             Physical Exam:      General: NAD  Eyes: EOMI  ENT: neck supple  Cardiovascular: Tachycardic.   Respiratory: Bilateral symmetric expansion  Gastrointestinal: Soft, non tender  Genitourinary: no suprapubic tenderness  Musculoskeletal: No edema  Skin: warm, dry  Neuro:  Alert.  Psych: Mood appropriate.         Medications:   Medications:    ceFAZolin   2,000 mg IntraVENous q8h    ferrous sulfate   325 mg Oral Daily with breakfast    insulin  glargine  40 Units SubCUTAneous Nightly    levothyroxine   25 mcg Oral Daily    losartan   25 mg Oral Daily    atorvastatin   20 mg Oral Nightly    sodium chloride  flush  5-40 mL IntraVENous 2 times per day    enoxaparin   40 mg SubCUTAneous Daily    sodium chloride  flush  5-40 mL IntraVENous 2 times per day    insulin  lispro  0-8 Units SubCUTAneous 4x Daily AC & HS      Infusions:    sodium chloride       sodium chloride       dextrose        PRN Meds: sodium chloride  flush, 5-40 mL, PRN  sodium chloride , , PRN  magnesium  sulfate, 2,000 mg, PRN  ondansetron , 4 mg, Q8H PRN   Or  ondansetron , 4 mg, Q6H PRN  polyethylene glycol, 17 g, Daily PRN  acetaminophen , 650 mg, Q6H PRN   Or  acetaminophen , 650 mg, Q6H PRN  sodium chloride  flush, 5-40 mL, PRN  sodium chloride , , PRN  glucose, 4 tablet,  PRN  dextrose  bolus, 125 mL, PRN   Or  dextrose  bolus, 250 mL, PRN  glucagon , 1 mg, PRN  dextrose , , Continuous PRN        Labs and Imaging   XR HIP 2-3 VW W PELVIS LEFT  Result Date: 05/03/2024  XR HIP 2-3 VW W PELVIS LEFT Indication: Left hip pain. History trochanteric bursitis. COMPARISON: 03/31/2024 Findings: Frontal view the pelvis and frog-leg lateral view of the left hip were obtained. The bones are well-mineralized. There is no acute fracture or dislocation. The regional soft tissues are unremarkable.     Impression: No acute osseous abnormality. Electronically signed by Belvie Plana, MD    XR CHEST PORTABLE  Result Date: 05/03/2024  XR CHEST PORTABLE Indication: Cough COMPARISON: 03/27/2024 Findings: Portable AP upright view of the chest was obtained. The heart is normal in size and configuration. The lungs are clear. There is no effusion or pneumothorax.     Impression: No acute cardiopulmonary abnormality. Electronically signed by Belvie Plana, MD      CBC:   Recent Labs     05/03/24  1417 05/04/24  0507 05/05/24  0645   WBC 12.3* 10.8 10.7   HGB 10.1* 10.2* 9.7*   PLT 362 360 376     BMP:    Recent Labs     05/03/24  1417 05/04/24  0507 05/04/24  1606 05/05/24  0645   NA 137 142  --  140   K 3.7 2.9* 3.9 3.1*   CL 101 104  --  101   CO2 25 26  --  27   BUN 6* 5*  --  6*   CREATININE 0.7 0.6  --  0.6   GLUCOSE 274* 125*  --  153*     Hepatic:   Recent Labs     05/03/24  1417 05/04/24  0507   AST 11* 10*   ALT 6* <5*   BILITOT 0.6 0.6   ALKPHOS 156* 152*     Lipids:   Lab Results   Component Value Date/Time    CHOL 154 11/25/2023 02:44 PM    HDL 22 11/25/2023 02:44 PM  TRIG 170 11/25/2023 02:44 PM     Hemoglobin A1C:   Lab Results   Component Value Date/Time    LABA1C 8.5 05/04/2024 10:40 AM     TSH:   Lab Results   Component Value Date/Time    TSH 1.92 08/27/2022 04:35 PM     Troponin: No results found for: TROPONINT  Lactic Acid: No results for input(s): LACTA in the last 72 hours.  BNP: No  results for input(s): PROBNP in the last 72 hours.  UA:  Lab Results   Component Value Date/Time    NITRU Negative 05/03/2024 04:37 PM    COLORU Yellow 05/03/2024 04:37 PM    PHUR 7.0 05/03/2024 04:37 PM    PHUR 5.5 02/25/2017 12:22 PM    WBCUA 10-20 05/03/2024 04:37 PM    RBCUA 51-100 05/03/2024 04:37 PM    TRICHOMONAS Present 05/03/2024 04:37 PM    BACTERIA 3+ 03/28/2024 02:03 AM    CLARITYU Clear 05/03/2024 04:37 PM    LEUKOCYTESUR SMALL 05/03/2024 04:37 PM    UROBILINOGEN 0.2 05/03/2024 04:37 PM    BILIRUBINUR Negative 05/03/2024 04:37 PM    BLOODU LARGE 05/03/2024 04:37 PM    GLUCOSEU 100 05/03/2024 04:37 PM    KETUA Negative 05/03/2024 04:37 PM     Urine Cultures:   Lab Results   Component Value Date/Time    LABURIN 25,000 CFU/ml  PBP2= Negative   03/28/2024 01:54 AM     Blood Cultures:   Lab Results   Component Value Date/Time    [facility]  05/03/2024 03:10 PM     No Growth to date.  Any change in status will be called.     Lab Results   Component Value Date/Time    BLOODCULT2  05/03/2024 03:10 PM     Gram stain Aerobic bottle:  Gram positive cocci in clusters  resembling Staphylococcus  Information to follow      BLOODCULT2  05/03/2024 03:10 PM     mecA gene not detected  See additional report for complete BCID panel.       Organism:   Lab Results   Component Value Date/Time    [facility] Staph aureus DNA Detected 05/03/2024 03:10 PM         Electronically signed by Sheree CHRISTELLA Evern Marylyn, MD on 05/05/2024 at 2:24 PM

## 2024-05-06 ENCOUNTER — Ambulatory Visit: Payer: BLUE CROSS/BLUE SHIELD | Primary: Oncology

## 2024-05-06 ENCOUNTER — Inpatient Hospital Stay: Admit: 2024-05-06 | Discharge: 2024-05-12 | Payer: BLUE CROSS/BLUE SHIELD | Primary: Oncology

## 2024-05-06 LAB — CBC WITH AUTO DIFFERENTIAL
Basophils %: 0.5 %
Basophils Absolute: 0.1 10*3/uL (ref 0.0–0.2)
Eosinophils %: 2.3 %
Eosinophils Absolute: 0.3 10*3/uL (ref 0.0–0.6)
Hematocrit: 29.9 % — ABNORMAL LOW (ref 36.0–48.0)
Hemoglobin: 9.9 g/dL — ABNORMAL LOW (ref 12.0–16.0)
Lymphocytes %: 21.6 %
Lymphocytes Absolute: 2.5 10*3/uL (ref 1.0–5.1)
MCH: 26.8 pg (ref 26.0–34.0)
MCHC: 33.2 g/dL (ref 31.0–36.0)
MCV: 80.9 fL (ref 80.0–100.0)
MPV: 8.2 fL (ref 5.0–10.5)
Monocytes %: 6.4 %
Monocytes Absolute: 0.7 10*3/uL (ref 0.0–1.3)
Neutrophils %: 69.2 %
Neutrophils Absolute: 7.9 10*3/uL — ABNORMAL HIGH (ref 1.7–7.7)
Platelets: 372 10*3/uL (ref 135–450)
RBC: 3.7 M/uL — ABNORMAL LOW (ref 4.00–5.20)
RDW: 15.8 % — ABNORMAL HIGH (ref 12.4–15.4)
WBC: 11.4 10*3/uL — ABNORMAL HIGH (ref 4.0–11.0)

## 2024-05-06 LAB — POCT GLUCOSE
POC Glucose: 236 mg/dL — ABNORMAL HIGH (ref 70–99)
POC Glucose: 195 mg/dL — ABNORMAL HIGH (ref 70–99)
POC Glucose: 397 mg/dL — ABNORMAL HIGH (ref 70–99)
POC Glucose: 315 mg/dL — ABNORMAL HIGH (ref 70–99)

## 2024-05-06 LAB — ECHO (TTE) LIMITED (PRN CONTRAST/BUBBLE/STRAIN/3D)
Ao Root Index: 1.27 cm/m2
Aortic Root: 2.3 cm
Body Surface Area: 1.84 m2
EF Physician: 58 %
Fractional Shortening 2D: 32 % (ref 28–44)
IVSd: 0.8 cm (ref 0.6–0.9)
LV EDV A2C: 82 mL
LV EDV A4C: 75 mL
LV EDV Index A2C: 45 mL/m2
LV EDV Index A4C: 41 mL/m2
LV ESV A2C: 32 mL
LV ESV A4C: 37 mL
LV ESV Index A2C: 18 mL/m2
LV ESV Index A4C: 20 mL/m2
LV Ejection Fraction A2C: 61 %
LV Ejection Fraction A4C: 51 %
LV Mass 2D Index: 67.5 g/m2 (ref 43–95)
LV Mass 2D: 122.3 g (ref 67–162)
LV RWT Ratio: 0.34
LVIDd Index: 2.6 cm/m2
LVIDd: 4.7 cm (ref 3.9–5.3)
LVIDs Index: 1.77 cm/m2
LVIDs: 3.2 cm
LVPWd: 0.8 cm (ref 0.6–0.9)

## 2024-05-06 LAB — CULTURE, BLOOD 2: Culture, Blood 2: POSITIVE

## 2024-05-06 LAB — BASIC METABOLIC PANEL
Anion Gap: 12 (ref 3–16)
BUN: 11 mg/dL (ref 7–20)
CO2: 25 mmol/L (ref 21–32)
Calcium: 9.2 mg/dL (ref 8.3–10.6)
Chloride: 101 mmol/L (ref 99–110)
Creatinine: 0.7 mg/dL (ref 0.6–1.1)
Est, Glom Filt Rate: >90
Glucose: 190 mg/dL — ABNORMAL HIGH (ref 70–99)
Potassium: 3.3 mmol/L — ABNORMAL LOW (ref 3.5–5.1)
Sodium: 138 mmol/L (ref 136–145)

## 2024-05-06 MED ORDER — INSULIN LISPRO 100 UNIT/ML IJ SOLN
100 | Freq: Three times a day (TID) | INTRAMUSCULAR | Status: DC
Start: 2024-05-06 — End: 2024-05-11
  Administered 2024-05-06 – 2024-05-11 (×14): 5 [IU] via SUBCUTANEOUS

## 2024-05-06 MED ORDER — OXYCODONE HCL 5 MG PO TABS
5 | Freq: Four times a day (QID) | ORAL | Status: DC | PRN
Start: 2024-05-06 — End: 2024-05-11
  Administered 2024-05-06 – 2024-05-11 (×12): 5 mg via ORAL

## 2024-05-06 MED ORDER — GADOTERIDOL 279.3 MG/ML IV SOLN
279.3 | Freq: Once | INTRAVENOUS | Status: AC | PRN
Start: 2024-05-06 — End: 2024-05-05
  Administered 2024-05-06: 02:00:00 15 mL via INTRAVENOUS

## 2024-05-06 MED ORDER — POTASSIUM CHLORIDE CRYS ER 20 MEQ PO TBCR
20 | Freq: Once | ORAL | Status: AC
Start: 2024-05-06 — End: 2024-05-06
  Administered 2024-05-06: 17:00:00 40 meq via ORAL

## 2024-05-06 MED FILL — LEVOTHYROXINE SODIUM 25 MCG PO TABS: 25 ug | ORAL | Qty: 1 | Fill #0

## 2024-05-06 MED FILL — INSULIN LISPRO 100 UNIT/ML IJ SOLN: 100 [IU]/mL | INTRAMUSCULAR | Qty: 2 | Fill #0

## 2024-05-06 MED FILL — POTASSIUM CHLORIDE CRYS ER 20 MEQ PO TBCR: 20 meq | ORAL | Qty: 2 | Fill #0

## 2024-05-06 MED FILL — ENOXAPARIN SODIUM 40 MG/0.4ML IJ SOSY: 40 MG/0.4ML | INTRAMUSCULAR | Qty: 0.4 | Fill #0

## 2024-05-06 MED FILL — ATORVASTATIN CALCIUM 20 MG PO TABS: 20 mg | ORAL | Qty: 1 | Fill #0

## 2024-05-06 MED FILL — CEFAZOLIN SODIUM 1 G IJ SOLR: 1 g | INTRAMUSCULAR | Qty: 2000 | Fill #0

## 2024-05-06 MED FILL — INSULIN LISPRO 100 UNIT/ML IJ SOLN: 100 [IU]/mL | INTRAMUSCULAR | Qty: 6 | Fill #0

## 2024-05-06 MED FILL — LANTUS 100 UNIT/ML SC SOLN: 100 [IU]/mL | SUBCUTANEOUS | Qty: 40 | Fill #0

## 2024-05-06 MED FILL — FERROUS SULFATE 325 (65 FE) MG PO TABS: 325 (65 Fe) MG | ORAL | Qty: 1 | Fill #0

## 2024-05-06 MED FILL — INSULIN LISPRO 100 UNIT/ML IJ SOLN: 100 [IU]/mL | INTRAMUSCULAR | Qty: 5 | Fill #0

## 2024-05-06 MED FILL — INSULIN LISPRO 100 UNIT/ML IJ SOLN: 100 [IU]/mL | INTRAMUSCULAR | Qty: 8 | Fill #0

## 2024-05-06 MED FILL — OXYCODONE HCL 5 MG PO TABS: 5 mg | ORAL | Qty: 1 | Fill #0

## 2024-05-06 MED FILL — LOSARTAN POTASSIUM 25 MG PO TABS: 25 mg | ORAL | Qty: 1 | Fill #0

## 2024-05-06 MED FILL — ACETAMINOPHEN 325 MG PO TABS: 325 mg | ORAL | Qty: 2 | Fill #0

## 2024-05-06 NOTE — Plan of Care (Signed)
 Problem: Chronic Conditions and Co-morbidities  Goal: Patient's chronic conditions and co-morbidity symptoms are monitored and maintained or improved  Outcome: Progressing  Flowsheets (Taken 05/06/2024 1936)  Care Plan - Patient's Chronic Conditions and Co-Morbidity Symptoms are Monitored and Maintained or Improved:   Monitor and assess patient's chronic conditions and comorbid symptoms for stability, deterioration, or improvement   Collaborate with multidisciplinary team to address chronic and comorbid conditions and prevent exacerbation or deterioration   Update acute care plan with appropriate goals if chronic or comorbid symptoms are exacerbated and prevent overall improvement and discharge     Problem: Discharge Planning  Goal: Discharge to home or other facility with appropriate resources  Outcome: Progressing  Flowsheets (Taken 05/06/2024 1936)  Discharge to home or other facility with appropriate resources:   Identify barriers to discharge with patient and caregiver   Arrange for needed discharge resources and transportation as appropriate   Identify discharge learning needs (meds, wound care, etc)   Refer to discharge planning if patient needs post-hospital services based on physician order or complex needs related to functional status, cognitive ability or social support system     Problem: Pain  Goal: Verbalizes/displays adequate comfort level or baseline comfort level  Outcome: Progressing  Flowsheets (Taken 05/06/2024 1936)  Verbalizes/displays adequate comfort level or baseline comfort level:   Encourage patient to monitor pain and request assistance   Assess pain using appropriate pain scale   Notify Licensed Independent Practitioner if interventions unsuccessful or patient reports new pain   Implement non-pharmacological measures as appropriate and evaluate response   Administer analgesics based on type and severity of pain and evaluate response   Consider cultural and social influences on pain and  pain management     Problem: Safety - Adult  Goal: Free from fall injury  Outcome: Progressing  Flowsheets (Taken 05/06/2024 1936)  Free From Fall Injury: Instruct family/caregiver on patient safety

## 2024-05-06 NOTE — Progress Notes (Signed)
 V2.0    USACS Progress Note      Name:  Shelly Richard DOB/Age/Sex: Jul 13, 1985  (39 y.o. female)   MRN & CSN:  [id number] & [id number] Encounter Date/Time: 05/06/2024 9:58 AM EST   Location:  6305/6305-01 PCP: Shelly Bernice BRAVO, [name]     Attending:[facility]rig[facility]: 4    Assessment and Recommendations     39 year old female with a history of uncontrolled diabetes mellitus with last A1c of 13.9, recent hospitalization for DKA which was complicated by left hip bursitis requiring I&D and long-term IV antibiotics presenting from home with tachycardia, fever spikes.  Concern for recurrence of left hip bursitis.  Currently with MRI of the left hip showing persistent fasciitis, bursitis, abscess formation along the gluteus medius, lateral upper thigh musculature fossa, greater trochanteric bursa.  Blood cultures are growing MSSA. Due for InD of left hip tomorrow 05/07/2024.     Fever  Possible sepsis secondary to left hip bursitis  MSSA bacteremia  - Follow-up Ortho consult.  -Continue IV Ancef .  - ID consult.  Discussed with ID.   -Continue to follow repeat blood cultures  - TTE without significant findings.     Hypothyroidism  - Continue Synthroid .    Uncontrolled diabetes mellitus 13.9% last A1c  - Recheck A1c better control 8.5%.  - Continue Lantus  40 units nightly.  Continue sliding scale as ordered. Start prandials.     Hypertension  - Continue losartan     Microcytic anemia  - Continue iron  supplementation.        Diet ADULT DIET; Regular; 4 carb choices (60 gm/meal)   DVT Prophylaxis [x]  Lovenox , []   Heparin, []  SCDs, []  Ambulation,  []  Eliquis, []  Xarelto  []  Coumadin   Code Status Full Code   Disposition From: Home  Expected Disposition: tbd  Estimated Date of Discharge: tbd  Patient requires continued admission due to Sepsis   Surrogate Decision Maker/ POA  Shelly Richard listed as primary contact.      Personally reviewed Lab Studies and Imaging     Discussed  management of the case with case management.  I reviewed IDs progress note.        Subjective:     Patient seen and evaluated at bedside. No acute complaints.       Review of Systems:      Pertinent positives and negatives discussed in HPI    Objective:   No intake or output data in the 24 hours ending 05/06/24 1059     Vitals:   Vitals:    05/05/24 1928 05/06/24 0014 05/06/24 0503 05/06/24 0753   BP: 103/69 114/67 133/79 120/76   Pulse: (!) 120 (!) 117 (!) 112 100   Resp: 18 18 18 18    Temp: 99 F (37.2 C) 98.6 F (37 C) 98.8 F (37.1 C) 98.6 F (37 C)   TempSrc: Oral Oral Oral Oral   SpO2: 96% 96% 98% 98%   Weight:       Height:             Physical Exam:      General: NAD  Eyes: EOMI  ENT: neck supple  Cardiovascular: Tachycardic.   Respiratory: Bilateral symmetric expansion  Gastrointestinal: Active bowel sounds.   Genitourinary: no suprapubic tenderness  Musculoskeletal: No edema  Skin: warm, dry  Neuro: Alert.  Psych: Mood appropriate.         Medications:   Medications:  ceFAZolin   2,000 mg IntraVENous q8h    ferrous sulfate   325 mg Oral Daily with breakfast    insulin  glargine  40 Units SubCUTAneous Nightly    levothyroxine   25 mcg Oral Daily    losartan   25 mg Oral Daily    atorvastatin   20 mg Oral Nightly    sodium chloride  flush  5-40 mL IntraVENous 2 times per day    enoxaparin   40 mg SubCUTAneous Daily    sodium chloride  flush  5-40 mL IntraVENous 2 times per day    insulin  lispro  0-8 Units SubCUTAneous 4x Daily AC & HS      Infusions:    sodium chloride       sodium chloride       dextrose        PRN Meds: sodium chloride  flush, 5-40 mL, PRN  sodium chloride , , PRN  magnesium  sulfate, 2,000 mg, PRN  ondansetron , 4 mg, Q8H PRN   Or  ondansetron , 4 mg, Q6H PRN  polyethylene glycol, 17 g, Daily PRN  acetaminophen , 650 mg, Q6H PRN   Or  acetaminophen , 650 mg, Q6H PRN  sodium chloride  flush, 5-40 mL, PRN  sodium chloride , , PRN  glucose, 4 tablet, PRN  dextrose  bolus, 125 mL, PRN   Or  dextrose  bolus,  250 mL, PRN  glucagon , 1 mg, PRN  dextrose , , Continuous PRN        Labs and Imaging   XR HIP 2-3 VW W PELVIS LEFT  Result Date: 05/03/2024  XR HIP 2-3 VW W PELVIS LEFT Indication: Left hip pain. History trochanteric bursitis. COMPARISON: 03/31/2024 Findings: Frontal view the pelvis and frog-leg lateral view of the left hip were obtained. The bones are well-mineralized. There is no acute fracture or dislocation. The regional soft tissues are unremarkable.     Impression: No acute osseous abnormality. Electronically signed by Shelly Plana, MD    XR CHEST PORTABLE  Result Date: 05/03/2024  XR CHEST PORTABLE Indication: Cough COMPARISON: 03/27/2024 Findings: Portable AP upright view of the chest was obtained. The heart is normal in size and configuration. The lungs are clear. There is no effusion or pneumothorax.     Impression: No acute cardiopulmonary abnormality. Electronically signed by Shelly Plana, MD      CBC:   Recent Labs     05/04/24  0507 05/05/24  0645 05/06/24  0740   WBC 10.8 10.7 11.4*   HGB 10.2* 9.7* 9.9*   PLT 360 376 372     BMP:    Recent Labs     05/04/24  0507 05/04/24  1606 05/05/24  0645 05/06/24  0740   NA 142  --  140 138   K 2.9* 3.9 3.1* 3.3*   CL 104  --  101 101   CO2 26  --  27 25   BUN 5*  --  6* 11   CREATININE 0.6  --  0.6 0.7   GLUCOSE 125*  --  153* 190*     Hepatic:   Recent Labs     05/03/24  1417 05/04/24  0507   AST 11* 10*   ALT 6* <5*   BILITOT 0.6 0.6   ALKPHOS 156* 152*     Lipids:   Lab Results   Component Value Date/Time    CHOL 154 11/25/2023 02:44 PM    HDL 22 11/25/2023 02:44 PM    TRIG 170 11/25/2023 02:44 PM     Hemoglobin A1C:   Lab Results  Component Value Date/Time    LABA1C 8.5 05/04/2024 10:40 AM     TSH:   Lab Results   Component Value Date/Time    TSH 1.92 08/27/2022 04:35 PM     Troponin: No results found for: TROPONINT  Lactic Acid: No results for input(s): LACTA in the last 72 hours.  BNP: No results for input(s): PROBNP in the last 72  hours.  UA:  Lab Results   Component Value Date/Time    NITRU Negative 05/03/2024 04:37 PM    COLORU Yellow 05/03/2024 04:37 PM    PHUR 7.0 05/03/2024 04:37 PM    PHUR 5.5 02/25/2017 12:22 PM    WBCUA 10-20 05/03/2024 04:37 PM    RBCUA 51-100 05/03/2024 04:37 PM    TRICHOMONAS Present 05/03/2024 04:37 PM    BACTERIA 3+ 03/28/2024 02:03 AM    CLARITYU Clear 05/03/2024 04:37 PM    LEUKOCYTESUR SMALL 05/03/2024 04:37 PM    UROBILINOGEN 0.2 05/03/2024 04:37 PM    BILIRUBINUR Negative 05/03/2024 04:37 PM    BLOODU LARGE 05/03/2024 04:37 PM    GLUCOSEU 100 05/03/2024 04:37 PM    KETUA Negative 05/03/2024 04:37 PM     Urine Cultures:   Lab Results   Component Value Date/Time    LABURIN 25,000 CFU/ml  PBP2= Negative   03/28/2024 01:54 AM     Blood Cultures:   Lab Results   Component Value Date/Time    [facility]  05/03/2024 03:10 PM     No Growth to date.  Any change in status will be called.     Lab Results   Component Value Date/Time    BLOODCULT2  05/03/2024 03:10 PM     Gram stain Aerobic bottle:  Gram positive cocci in clusters  resembling Staphylococcus  Information to follow      BLOODCULT2  05/03/2024 03:10 PM     mecA gene not detected  See additional report for complete BCID panel.      BLOODCULT2  05/03/2024 03:10 PM     POSITIVE  Isolated one of two sets  Sensitivity to follow       Organism:   Lab Results   Component Value Date/Time    ORG Staph aureus DNA Detected 05/03/2024 03:10 PM    ORG Staphylococcus aureus 05/03/2024 03:10 PM         Electronically signed by Sheree CHRISTELLA Evern Marylyn, MD on 05/06/2024 at 10:59 AM

## 2024-05-06 NOTE — Progress Notes (Signed)
 ID Follow-up NOTE    CC:   Fever, tachycardia   Antibiotics: Daptomycin      Admit Date: 05/03/2024  Hospital Day: 4    Subjective:     Patient has been afebrile, continues to have left hip discomfort with movement. PICC removed yesterday.       Objective:     Patient Vitals for the past 8 hrs:   BP Temp Temp src Pulse Resp SpO2   05/06/24 1227 -- -- -- -- 18 --   05/06/24 1210 109/65 98.4 F (36.9 C) Oral (!) 107 18 99 %   05/06/24 0753 120/76 98.6 F (37 C) Oral 100 18 98 %     I/O last 3 completed shifts:  In: 1220.2 [P.O.:420; I.V.:150; IV Piggyback:650.1]  Out: -   No intake/output data recorded.    EXAM:  GENERAL: No apparent distress.    HEENT: Membranes moist, no oral lesion  NECK:  Supple, no lymphadenopathy  LUNGS: Clear b/l, no rales, no dullness  CARDIAC: RRR  ABD:  + BS, soft / NT  EXT:   Left hip incision is well-healed without any surrounding induration or redness evident, slight tenderness to palpation in all weightbearing  NEURO: No focal neurologic findings  PSYCH: Orientation, sensorium, mood normal  LINES:  Peripheral iv, PICC line has been removed.  No induration at this previous site.       Data Review:  Lab Results   Component Value Date    WBC 11.4 (H) 05/06/2024    HGB 9.9 (L) 05/06/2024    HCT 29.9 (L) 05/06/2024    MCV 80.9 05/06/2024    PLT 372 05/06/2024     Lab Results   Component Value Date    CREATININE 0.7 05/06/2024    BUN 11 05/06/2024    NA 138 05/06/2024    K 3.3 (L) 05/06/2024    CL 101 05/06/2024    CO2 25 05/06/2024       Hepatic Function Panel:   Lab Results   Component Value Date/Time    ALKPHOS 152 05/04/2024 05:07 AM    ALT <5 05/04/2024 05:07 AM    AST 10 05/04/2024 05:07 AM    BILITOT 0.6 05/04/2024 05:07 AM    BILIDIR 0.2 05/03/2024 02:17 PM    IBILI 0.4 05/03/2024 02:17 PM       MICRO:  Blood cultures x 2 1/19: 1 set positive for MSSA per PCR     Blood cultures x 2 12/21: Neg      OR culture left hip bursa 12/19: Rare growth Staph aureus, MSSA     Blood cultures x 2  12/16: No growth     Left hip bursa aspirate culture 12/18: Moderate growth MSSA     Blood cultures x 2 12/15: Positive for MSSA     Blood cultures x 2 12/13: Positive for MSSA  Staphylococcus aureus (2)     Antibiotic Interpretation MIC   Method Status     clindamycin  Sensitive <=0.25 mcg/mL BACTERIAL SUSCEPTIBILITY PANEL BY MIC       erythromycin  Sensitive <=0.25 mcg/mL BACTERIAL SUSCEPTIBILITY PANEL BY MIC       levofloxacin Resistant 4 mcg/mL BACTERIAL SUSCEPTIBILITY PANEL BY MIC       linezolid Sensitive 2 mcg/mL BACTERIAL SUSCEPTIBILITY PANEL BY MIC       oxacillin Sensitive 0.5 mcg/mL BACTERIAL SUSCEPTIBILITY PANEL BY MIC       tetracycline Sensitive <=1 mcg/mL BACTERIAL SUSCEPTIBILITY PANEL BY MIC  Tetracyclines have decreased renal elimination and  urinary bladder concentrations.  Use of this class of  agents for urinary tract infection should be limited to  patients that cannot tolerate an alternative therapy.           trimethoprim-sulfamethoxazole Sensitive <=10 mcg/mL BACTERIAL SUSCEPTIBILITY PANEL BY MIC       vancomycin  Sensitive 1 mcg/mL BACTERIAL SUSCEPTIBILITY PANEL BY MIC          Urine culture 12/14: 25,000 CFU MSSA      IMAGING: I have independently reviewed the images and reports.       MRI left hip with and without contrast 1/20:  1. Persistent fasciitis, bursitis, myositis and abscess formation along the gluteus medius, the lateral upper thigh musculature fascia, and along the greater trochanteric bursa. The abscess collection has decreased in size. The extent of inflammation   appears to be mildly improved in comparison to the prior exam.     Portable chest x-ray 1/19:  No acute cardiopulmonary abnormality     X-ray left hip and pelvis 1/19:  No acute osseous abnormality.     MRI left hip with and without contrast 12/17:  1. There is a 4.6 cm left greater trochanter bursal fluid collection with thin tract which extends superiorly into the left gluteus medius muscle. There is extensive  surrounding edema as well as edema within the left gluteus medius muscle, and lateral   aspect of the adjacent left gluteus maximus muscle. In the setting of infection this is most worrisome for a septic bursitis-abscess.  2. No evidence of any osteomyelitis.  3. No acute osseous abnormality.        Scheduled Meds:   insulin  lispro  5 Units SubCUTAneous TID WC    ceFAZolin   2,000 mg IntraVENous q8h    ferrous sulfate   325 mg Oral Daily with breakfast    insulin  glargine  40 Units SubCUTAneous Nightly    levothyroxine   25 mcg Oral Daily    losartan   25 mg Oral Daily    atorvastatin   20 mg Oral Nightly    sodium chloride  flush  5-40 mL IntraVENous 2 times per day    enoxaparin   40 mg SubCUTAneous Daily    sodium chloride  flush  5-40 mL IntraVENous 2 times per day    insulin  lispro  0-8 Units SubCUTAneous 4x Daily AC & HS       Continuous Infusions:   sodium chloride       sodium chloride       dextrose          PRN Meds:  oxyCODONE , sodium chloride  flush, sodium chloride , magnesium  sulfate, ondansetron  **OR** ondansetron , polyethylene glycol, acetaminophen  **OR** acetaminophen , sodium chloride  flush, sodium chloride , glucose, dextrose  bolus **OR** dextrose  bolus, glucagon , dextrose       Assessment:       Patient Active Problem List   Diagnosis    Mixed hyperlipidemia    Iron  deficiency anemia    Type 2 diabetes mellitus with hyperglycemia, with long-term current use of insulin  (HCC)    Essential hypertension    Acquired hypothyroidism    Mild intermittent asthma without complication    Allergic rhinitis    Heart murmur    Uterine fibroid    Class 1 obesity due to excess calories with serious comorbidity and body mass index (BMI) of 31.0 to 31.9 in adult    Abnormal uterine bleeding    Eczema    Glaucoma suspect, bilateral    Hidradenitis axillaris    HSV-2  infection    Myopic astigmatism, bilateral    Strabismus    S/P myomectomy    DKA, type 2, not at goal [facility])    Acute nonintractable headache    MSSA bacteremia     Diabetic ketoacidosis without coma associated with type 2 diabetes mellitus (HCC)    Greater trochanteric bursitis of left hip    Abscess of bursa of left hip    Sepsis (HCC)    Type 2 diabetes mellitus with other specified complication [facility])        Plan:   39 year old African-American female with history of DM2, HTN, HLD that presented on 03/27/24 with headache and shortness of breath. She was found to be in DKA and was admitted to the ICU.  During that admission she was also found to have MSSA bacteremia and left hip greater trochanteric bursitis. She is on IV abx course with daptomycin  and here with fevers and tachycardia     MSSA bacteremia and left hip greater trochanteric bursitis:  - hx of recent admission here on 03/2024 in the ICU with DKA, was also found to have MSSA bacteremia with left hip trochanteric greater bursitis  - previous MRI hip on  03/31/2024 showed 4.6cm left greater trochanteric bursal fluid collection.  This was aspirated with IR and yielded growth of MSSA.    - orthopedics performed operative washout of the bursa on 04/02/24 with operative cultures showing growth of the same.  -Patient has been on a course of IV daptomycin  now doing this at home.  We initially had to choose a once a day antibiotic as patient was receiving charity care via [facility] and therefore was DC'd on daptomycin . This was initially planned to end on 1/16 however due to persistently elevated inflammatory markers, was extended to 1/30, and had planned OP repeat MRI hip.   -Now admitted on 1/19 with fever and tachycardia  - WBC was 12.3 and Tmax of 101.8.  ESR/CRP was 94/126 respectively.  She has been resumed on daptomycin   - Blood cultures x 2 taken on admission from 1/19 now with one set positive for MSSA.    -Patient denies any new complaints besides left hip tenderness, states slight pain on bearing weight and sometimes gives out.    - MRI left hip on this admission showing persistent fasciitis, bursitis, myositis and  abscess formation along the gluteus medius, the lateral upper thigh musculature fascia, and along the greater trochanteric bursa. The abscess collection has decreased in size.   - ortho eval, Dr.Erlichmann to perform washout tomorrow, will appreciate cx to be obtained at that time. Follow for operative findings.   - with positive Bcx here and isolation of MSSA, stopped dapto and placed on cefazolin  to have targeted MSSA therapy 1/21.   - persistence of bacteremia concerning for source control issue.  If further orthopedic intervention is performed, would appreciate cultures to be obtained at that time.  -With ongoing bacteremia and presence of PICC line, PICC line removed 1/21 and culture tip which remains neg to date.    - repeat sets of blood cultures remains negative to date. Plan to hold off on new central line placement until repeat sets of blood cultures remain negative.  Will also repeat TTE     DM2:  - Good glycemic control to aid in healing and prevention of further infections.  - A1c on last admission was 13.9, consider recheck  - Patient previously lost her insurance and was not on any diabetic medications, this  has been resumed since the last admission.      Medical Decision Making:  The following items were considered in medical decision making:  Discussion of patient care with other providers  Reviewed clinical lab tests  Reviewed radiology tests  Reviewed other diagnostic tests/interventions  Independent review of radiologic images  Microbiology cultures and other micro tests reviewed      Please note that this chart was generated using Dragon dictation software. Although every effort was made to ensure the accuracy of this automated transcription, some errors in transcription may have occurred inadvertently.  Any pictures or media included in this note were obtained after taking informed verbal consent from the patient and with their approval to include those in the patient's medical record.      Discussed with patient, ortho, Dr. Melodie, social work and primary team, Dr. Evern.  Jacklyne Baik, MD

## 2024-05-06 NOTE — Care Coordination (Signed)
 DISCHARGE PLANNING:    Chart Reviewed:    Admitted with Sepsis:     -   Pt is IPTA and plans to return home at discharge. Has PICC line.     Active  with  Home [facility] and [facility] :    [address] [facility]  [address], [place], [place] [address]  [phone]  [phone]    [facility] - [address]  [address] [place], [place] [address]  [phone]  [phone]    -  ID following. If still needs IV abx at discharge will resume services if not, pt is IPTA and states her boyfriend will transport her home.  -   Pt is active with [facility] and [facility].      BARRIERS:  Ortho Surg: PLAN      WBAT Will plan to take back to OR tomorrow for formal I&D of the left hip again.  NPO after MN.    The Plan for Transition of Care is related to the following treatment goals of Sepsis (HCC) [A41.9]  Sepsis without acute organ dysfunction, due to unspecified organism [facility]) [A41.9]      CM cont to follow     Electronically signed by Darice Croft, RN on 05/06/2024 at 2:17 PM       Darice Croft  RN Case Manager  [facility]  [address]  [place] [address]  [phone]  Fax [phone]

## 2024-05-06 NOTE — Telephone Encounter (Signed)
 Pt call back to advise she spoke with her job, and they stated that Bernice can put a month on the paperwork so that it secures her job. Date can be adjusted later.

## 2024-05-06 NOTE — Progress Notes (Signed)
 Vascular Access Team called to assist with PIV.  See Flowsheet for USG PIV information, below is ultrasound evaluation of vein:

## 2024-05-06 NOTE — Progress Notes (Signed)
 Department of Orthopedic Surgery  Physician Assistant   Progress Note    Subjective:       Systemic or Specific Complaints:Pain about the same today and leg still feels shaky she states.  Walking around room with a limp today on arrival.    Objective:     Patient Vitals for the past 24 hrs:   BP Temp Temp src Pulse Resp SpO2   05/06/24 1227 -- -- -- -- 18 --   05/06/24 1210 109/65 98.4 F (36.9 C) Oral (!) 107 18 99 %   05/06/24 0753 120/76 98.6 F (37 C) Oral 100 18 98 %   05/06/24 0503 133/79 98.8 F (37.1 C) Oral (!) 112 18 98 %   05/06/24 0014 114/67 98.6 F (37 C) Oral (!) 117 18 96 %   05/05/24 1928 103/69 99 F (37.2 C) Oral (!) 120 18 96 %   05/05/24 1729 (!) 145/88 98.6 F (37 C) Oral (!) 107 16 98 %   05/05/24 1437 (!) 143/82 98.4 F (36.9 C) Oral (!) 102 16 99 %       General: alert, appears stated age, and cooperative   Wound: Incision well healed.  No significant tenderness to palpation in the lateral hip.   Motion: Painful range of Motion in affected extremity   DVT Exam: No evidence of DVT seen on physical exam.     Additional exam: NVI  Distal pulses intact.  Sensation intact.      Data Review  CBC:   Lab Results   Component Value Date/Time    WBC 11.4 05/06/2024 07:40 AM    RBC 3.70 05/06/2024 07:40 AM    HGB 9.9 05/06/2024 07:40 AM    HCT 29.9 05/06/2024 07:40 AM    PLT 372 05/06/2024 07:40 AM       Renal:   Lab Results   Component Value Date/Time    NA 138 05/06/2024 07:40 AM    K 3.3 05/06/2024 07:40 AM    K 2.9 05/04/2024 05:07 AM    CL 101 05/06/2024 07:40 AM    CO2 25 05/06/2024 07:40 AM    BUN 11 05/06/2024 07:40 AM    CREATININE 0.7 05/06/2024 07:40 AM    GLUCOSE 190 05/06/2024 07:40 AM    CALCIUM  9.2 05/06/2024 07:40 AM            Assessment:      1 mo s/p L troch bursa I&D for septic troch bursitis, uncontrolled DM. LLE weakness. Decreased but continued fluid collection L hip.     Plan:      1:  PT/OT as able. WBAT Will plan to take back to OR tomorrow for formal I&D of the left  hip again.  NPO after MN.  Consent obtained.  2:  Continue Deep venous thrombosis prophylaxis  3:  Continue Pain Control - minimize opioids as able to maintain adequate pain control. Ice for swellng  4:  Discharge Home anticipated    [place], [place]

## 2024-05-06 NOTE — Telephone Encounter (Signed)
 Pt back in the hospital. Will be faxing FMLA paperwork to the office today. Having surgery tomorrow 05/07/24. Does not have a projected date of when she will be returning.     Advise pt that she will need to make a hospital follow up appt with Bernice in order to complete the paperwork in order to give a projected return date for work.

## 2024-05-07 LAB — CBC WITH AUTO DIFFERENTIAL
Basophils %: 0.5 %
Basophils Absolute: 0 10*3/uL (ref 0.0–0.2)
Eosinophils %: 3 %
Eosinophils Absolute: 0.3 10*3/uL (ref 0.0–0.6)
Hematocrit: 31.1 % — ABNORMAL LOW (ref 36.0–48.0)
Hemoglobin: 10.4 g/dL — ABNORMAL LOW (ref 12.0–16.0)
Lymphocytes %: 28.4 %
Lymphocytes Absolute: 2.7 10*3/uL (ref 1.0–5.1)
MCH: 27 pg (ref 26.0–34.0)
MCHC: 33.2 g/dL (ref 31.0–36.0)
MCV: 81.3 fL (ref 80.0–100.0)
MPV: 8 fL (ref 5.0–10.5)
Monocytes %: 6 %
Monocytes Absolute: 0.6 10*3/uL (ref 0.0–1.3)
Neutrophils %: 62.1 %
Neutrophils Absolute: 5.8 10*3/uL (ref 1.7–7.7)
Platelets: 399 10*3/uL (ref 135–450)
RBC: 3.83 M/uL — ABNORMAL LOW (ref 4.00–5.20)
RDW: 15.9 % — ABNORMAL HIGH (ref 12.4–15.4)
WBC: 9.4 10*3/uL (ref 4.0–11.0)

## 2024-05-07 LAB — POCT GLUCOSE
POC Glucose: 172 mg/dL — ABNORMAL HIGH (ref 70–99)
POC Glucose: 175 mg/dL — ABNORMAL HIGH (ref 70–99)
POC Glucose: 177 mg/dL — ABNORMAL HIGH (ref 70–99)
POC Glucose: 191 mg/dL — ABNORMAL HIGH (ref 70–99)
POC Glucose: 208 mg/dL — ABNORMAL HIGH (ref 70–99)
POC Glucose: 311 mg/dL — ABNORMAL HIGH (ref 70–99)

## 2024-05-07 LAB — URINALYSIS WITH REFLEX TO CULTURE
Bilirubin, Urine: NEGATIVE
Glucose, Ur: NEGATIVE mg/dL
Ketones, Urine: NEGATIVE mg/dL
Nitrite, Urine: NEGATIVE
Specific Gravity, UA: 1.015 (ref 1.005–1.030)
Urobilinogen, Urine: 0.2 U/dL (ref <2.0)
pH, Urine: 6 (ref 5.0–8.0)

## 2024-05-07 LAB — CULTURE, BLOOD 2
Culture, Blood 2: NOT DETECTED
Culture, Blood 2: POSITIVE
Organism: DETECTED — AB

## 2024-05-07 LAB — BASIC METABOLIC PANEL
Anion Gap: 11 (ref 3–16)
BUN: 13 mg/dL (ref 7–20)
CO2: 25 mmol/L (ref 21–32)
Calcium: 9.5 mg/dL (ref 8.3–10.6)
Chloride: 102 mmol/L (ref 99–110)
Creatinine: 0.7 mg/dL (ref 0.6–1.1)
Est, Glom Filt Rate: >90
Glucose: 139 mg/dL — ABNORMAL HIGH (ref 70–99)
Potassium: 3.7 mmol/L (ref 3.5–5.1)
Sodium: 138 mmol/L (ref 136–145)

## 2024-05-07 LAB — MICROSCOPIC URINALYSIS

## 2024-05-07 LAB — HCG, SERUM, QUALITATIVE: Preg, Serum: NEGATIVE

## 2024-05-07 MED ORDER — METHOCARBAMOL 1000 MG/10ML IJ SOLN
1000 | Freq: Once | INTRAMUSCULAR | Status: DC | PRN
Start: 2024-05-07 — End: 2024-05-07
  Administered 2024-05-07: 22:00:00 1000 via INTRAVENOUS

## 2024-05-07 MED ORDER — FENTANYL CITRATE (PF) 100 MCG/2ML IJ SOLN
100 | Freq: Once | INTRAMUSCULAR | Status: DC | PRN
Start: 2024-05-07 — End: 2024-05-07
  Administered 2024-05-07 (×2): 50 via INTRAVENOUS

## 2024-05-07 MED ORDER — BUPIVACAINE HCL (PF) 0.25 % IJ SOLN
0.25 | INTRAMUSCULAR | Status: DC | PRN
Start: 2024-05-07 — End: 2024-05-07
  Administered 2024-05-07: 22:00:00 30 via INTRADERMAL

## 2024-05-07 MED ORDER — SODIUM CHLORIDE 0.9 % IV SOLN
0.9 | INTRAVENOUS | Status: DC | PRN
Start: 2024-05-07 — End: 2024-05-07

## 2024-05-07 MED ORDER — PROPOFOL 200 MG/20ML IV EMUL
200 | INTRAVENOUS | Status: AC
Start: 2024-05-07 — End: 2024-05-07

## 2024-05-07 MED ORDER — PHENYLEPHRINE HCL 10 MG/ML SOLN (MIXTURES ONLY)
10 | INTRAVENOUS | Status: DC | PRN
Start: 2024-05-07 — End: 2024-05-07
  Administered 2024-05-07: 21:00:00 40 via INTRAVENOUS

## 2024-05-07 MED ORDER — SULFUR HEXAFLUORIDE MICROSPH 60.7-25 MG IJ SUSR
60.7-25 | Freq: Once | INTRAMUSCULAR | Status: DC | PRN
Start: 2024-05-07 — End: 2024-05-11

## 2024-05-07 MED ORDER — ONDANSETRON HCL 4 MG/2ML IJ SOLN
4 | INTRAMUSCULAR | Status: AC
Start: 2024-05-07 — End: 2024-05-07

## 2024-05-07 MED ORDER — OXYCODONE HCL 5 MG PO TABS
5 | ORAL | Status: DC | PRN
Start: 2024-05-07 — End: 2024-05-07

## 2024-05-07 MED ORDER — VANCOMYCIN HCL 1 G IV SOLR
1 | INTRAVENOUS | Status: AC
Start: 2024-05-07 — End: 2024-05-07

## 2024-05-07 MED ORDER — ONDANSETRON HCL 4 MG/2ML IJ SOLN
4 | Freq: Once | INTRAMUSCULAR | Status: DC | PRN
Start: 2024-05-07 — End: 2024-05-07
  Administered 2024-05-07: 21:00:00 4 via INTRAVENOUS

## 2024-05-07 MED ORDER — SUGAMMADEX SODIUM 200 MG/2ML IV SOLN
200 | Freq: Once | INTRAVENOUS | Status: DC | PRN
Start: 2024-05-07 — End: 2024-05-07
  Administered 2024-05-07: 22:00:00 200 via INTRAVENOUS

## 2024-05-07 MED ORDER — NORMAL SALINE FLUSH 0.9 % IV SOLN
0.9 | INTRAVENOUS | Status: DC | PRN
Start: 2024-05-07 — End: 2024-05-07

## 2024-05-07 MED ORDER — NORMAL SALINE FLUSH 0.9 % IV SOLN
0.9 | Freq: Two times a day (BID) | INTRAVENOUS | Status: DC
Start: 2024-05-07 — End: 2024-05-07

## 2024-05-07 MED ORDER — PROCHLORPERAZINE EDISYLATE 10 MG/2ML IJ SOLN
10 | Freq: Once | INTRAMUSCULAR | Status: DC | PRN
Start: 2024-05-07 — End: 2024-05-07

## 2024-05-07 MED ORDER — DEXAMETHASONE SODIUM PHOSPHATE 4 MG/ML IJ SOLN
4 | Freq: Once | INTRAMUSCULAR | Status: DC | PRN
Start: 2024-05-07 — End: 2024-05-07
  Administered 2024-05-07: 21:00:00 4 via INTRAVENOUS

## 2024-05-07 MED ORDER — PHENYLEPHRINE HCL 1 MG/10ML IV SOSY
1 | Freq: Once | INTRAVENOUS | Status: DC | PRN
Start: 2024-05-07 — End: 2024-05-07
  Administered 2024-05-07 (×2): 150 via INTRAVENOUS

## 2024-05-07 MED ORDER — PHENYLEPHRINE HCL 1 MG/10ML IV SOSY
1 | INTRAVENOUS | Status: AC
Start: 2024-05-07 — End: 2024-05-07

## 2024-05-07 MED ORDER — FENTANYL CITRATE (PF) 100 MCG/2ML IJ SOLN
100 | INTRAMUSCULAR | Status: AC
Start: 2024-05-07 — End: 2024-05-07

## 2024-05-07 MED ORDER — ROCURONIUM BROMIDE 50 MG/5ML IV SOLN
50 | INTRAVENOUS | Status: AC
Start: 2024-05-07 — End: 2024-05-07

## 2024-05-07 MED ORDER — DEXAMETHASONE SODIUM PHOSPHATE 4 MG/ML IJ SOLN
4 | INTRAMUSCULAR | Status: AC
Start: 2024-05-07 — End: 2024-05-07

## 2024-05-07 MED ORDER — SUGAMMADEX SODIUM 200 MG/2ML IV SOLN
200 | INTRAVENOUS | Status: AC
Start: 2024-05-07 — End: 2024-05-07

## 2024-05-07 MED ORDER — LABETALOL HCL 5 MG/ML IV SOLN
5 | INTRAVENOUS | Status: DC | PRN
Start: 2024-05-07 — End: 2024-05-07

## 2024-05-07 MED ORDER — LIDOCAINE (CARDIAC) 100 MG/5ML IV SOLN (MIXTURES ONLY)
100 | Freq: Once | INTRAVENOUS | Status: DC | PRN
Start: 2024-05-07 — End: 2024-05-07
  Administered 2024-05-07: 21:00:00 100 via INTRAVENOUS

## 2024-05-07 MED ORDER — VANCOMYCIN HCL 1 G IV SOLR
1 | INTRAVENOUS | Status: DC | PRN
Start: 2024-05-07 — End: 2024-05-07
  Administered 2024-05-07: 22:00:00 1000 via TOPICAL

## 2024-05-07 MED ORDER — GENTAMICIN SULFATE 40 MG/ML IJ SOLN
40 | INTRAMUSCULAR | Status: AC
Start: 2024-05-07 — End: 2024-05-07

## 2024-05-07 MED ORDER — FENTANYL CITRATE (PF) 100 MCG/2ML IJ SOLN
100 | INTRAMUSCULAR | Status: DC | PRN
Start: 2024-05-07 — End: 2024-05-07

## 2024-05-07 MED ORDER — ROCURONIUM BROMIDE 50 MG/5ML IV SOLN
50 | Freq: Once | INTRAVENOUS | Status: DC | PRN
Start: 2024-05-07 — End: 2024-05-07
  Administered 2024-05-07: 21:00:00 50 via INTRAVENOUS

## 2024-05-07 MED ORDER — LIDOCAINE (CARDIAC) 100 MG/5ML IV SOLN (MIXTURES ONLY)
100 | INTRAVENOUS | Status: AC
Start: 2024-05-07 — End: 2024-05-07

## 2024-05-07 MED ORDER — PROPOFOL 200 MG/20ML IV EMUL
200 | Freq: Once | INTRAVENOUS | Status: DC | PRN
Start: 2024-05-07 — End: 2024-05-07
  Administered 2024-05-07: 21:00:00 125 via INTRAVENOUS

## 2024-05-07 MED ORDER — LACTATED RINGERS IV SOLN
INTRAVENOUS | Status: DC | PRN
Start: 2024-05-07 — End: 2024-05-07
  Administered 2024-05-07: 21:00:00 via INTRAVENOUS

## 2024-05-07 MED ORDER — MIDAZOLAM HCL 2 MG/2ML IJ SOLN
2 | INTRAMUSCULAR | Status: AC
Start: 2024-05-07 — End: 2024-05-07

## 2024-05-07 MED ORDER — MIDAZOLAM HCL 2 MG/2ML IJ SOLN
2 | Freq: Once | INTRAMUSCULAR | Status: DC | PRN
Start: 2024-05-07 — End: 2024-05-07
  Administered 2024-05-07: 21:00:00 2 via INTRAVENOUS

## 2024-05-07 MED ORDER — METHOCARBAMOL 1000 MG/10ML IJ SOLN
1000 | INTRAMUSCULAR | Status: AC
Start: 2024-05-07 — End: 2024-05-07

## 2024-05-07 MED ORDER — SUCCINYLCHOLINE CHLORIDE 200 MG/10ML IV SOSY
200 | INTRAVENOUS | Status: AC
Start: 2024-05-07 — End: 2024-05-07

## 2024-05-07 MED ORDER — HYDROMORPHONE HCL 2 MG/ML IJ SOLN
2 | INTRAMUSCULAR | Status: AC | PRN
Start: 2024-05-07 — End: 2024-05-07
  Administered 2024-05-07 (×4): 0.5 mg via INTRAVENOUS

## 2024-05-07 MED ORDER — SODIUM CHLORIDE (PF) 0.9 % IJ SOLN
0.9 | INTRAMUSCULAR | Status: DC | PRN
Start: 2024-05-07 — End: 2024-05-07

## 2024-05-07 MED FILL — SUCCINYLCHOLINE CHLORIDE 200 MG/10ML IV SOSY: 200 MG/10ML | INTRAVENOUS | Qty: 10 | Fill #0

## 2024-05-07 MED FILL — MIDAZOLAM HCL 2 MG/2ML IJ SOLN: 2 mg/mL | INTRAMUSCULAR | Qty: 2 | Fill #0

## 2024-05-07 MED FILL — ENOXAPARIN SODIUM 40 MG/0.4ML IJ SOSY: 40 MG/0.4ML | INTRAMUSCULAR | Qty: 0.4 | Fill #0

## 2024-05-07 MED FILL — FENTANYL CITRATE (PF) 100 MCG/2ML IJ SOLN: 100 MCG/2ML | INTRAMUSCULAR | Qty: 2 | Fill #0

## 2024-05-07 MED FILL — OXYCODONE HCL 5 MG PO TABS: 5 mg | ORAL | Qty: 1 | Fill #0

## 2024-05-07 MED FILL — PHENYLEPHRINE HCL (PRESSORS) 1 MG/10ML IV SOSY: 1 MG/0ML | INTRAVENOUS | Qty: 10 | Fill #0

## 2024-05-07 MED FILL — BRIDION 200 MG/2ML IV SOLN: 200 MG/2ML | INTRAVENOUS | Qty: 2 | Fill #0

## 2024-05-07 MED FILL — CEFAZOLIN SODIUM 1 G IJ SOLR: 1 g | INTRAMUSCULAR | Qty: 2000 | Fill #0

## 2024-05-07 MED FILL — DIPRIVAN 200 MG/20ML IV EMUL: 200 MG/20ML | INTRAVENOUS | Qty: 20 | Fill #0

## 2024-05-07 MED FILL — GENTAMICIN SULFATE 40 MG/ML IJ SOLN: 40 mg/mL | INTRAMUSCULAR | Qty: 8 | Fill #0

## 2024-05-07 MED FILL — ATORVASTATIN CALCIUM 20 MG PO TABS: 20 mg | ORAL | Qty: 1 | Fill #0

## 2024-05-07 MED FILL — INSULIN LISPRO 100 UNIT/ML IJ SOLN: 100 [IU]/mL | INTRAMUSCULAR | Qty: 2 | Fill #0

## 2024-05-07 MED FILL — LIDOCAINE HCL (CARDIAC) PF 100 MG/5ML IV SOSY: 100 MG/5ML | INTRAVENOUS | Qty: 5 | Fill #0

## 2024-05-07 MED FILL — ONDANSETRON HCL 4 MG/2ML IJ SOLN: 4 MG/2ML | INTRAMUSCULAR | Qty: 2 | Fill #0

## 2024-05-07 MED FILL — LOSARTAN POTASSIUM 25 MG PO TABS: 25 mg | ORAL | Qty: 1 | Fill #0

## 2024-05-07 MED FILL — INSULIN LISPRO 100 UNIT/ML IJ SOLN: 100 [IU]/mL | INTRAMUSCULAR | Qty: 5 | Fill #0

## 2024-05-07 MED FILL — INSULIN LISPRO 100 UNIT/ML IJ SOLN: 100 [IU]/mL | INTRAMUSCULAR | Qty: 6 | Fill #0

## 2024-05-07 MED FILL — HYDROMORPHONE HCL 2 MG/ML IJ SOLN: 2 mg/mL | INTRAMUSCULAR | Qty: 1 | Fill #0

## 2024-05-07 MED FILL — FERROUS SULFATE 325 (65 FE) MG PO TABS: 325 (65 Fe) MG | ORAL | Qty: 1 | Fill #0

## 2024-05-07 MED FILL — ROCURONIUM BROMIDE 50 MG/5ML IV SOLN: 50 MG/5ML | INTRAVENOUS | Qty: 10 | Fill #0

## 2024-05-07 MED FILL — VANCOMYCIN HCL 1 G IV SOLR: 1 g | INTRAVENOUS | Qty: 2000 | Fill #0

## 2024-05-07 MED FILL — METHOCARBAMOL 1000 MG/10ML IJ SOLN: 1000 MG/10ML | INTRAMUSCULAR | Qty: 10 | Fill #0

## 2024-05-07 MED FILL — LANTUS 100 UNIT/ML SC SOLN: 100 [IU]/mL | SUBCUTANEOUS | Qty: 40 | Fill #0

## 2024-05-07 MED FILL — LEVOTHYROXINE SODIUM 25 MCG PO TABS: 25 ug | ORAL | Qty: 1 | Fill #0

## 2024-05-07 MED FILL — DEXAMETHASONE SODIUM PHOSPHATE 4 MG/ML IJ SOLN: 4 mg/mL | INTRAMUSCULAR | Qty: 1 | Fill #0

## 2024-05-07 NOTE — Progress Notes (Signed)
"  PACU RN called report to 6S, RN.   "

## 2024-05-07 NOTE — Progress Notes (Signed)
 Patient arrived to PACU post INCISION AND DRAINAGE LEFT HIP - Left with Dr. Deretha. VSS on arrival. CRNA gave PACU RN report at bedside stating no complications during procedure. Dressing to surgical site clean, dry and intact. Patient shows no signs of pain at this time. Will continue to monitor.

## 2024-05-07 NOTE — Progress Notes (Signed)
"  PACU RN attempted to call patient's family, no answer at this time.   "

## 2024-05-07 NOTE — Plan of Care (Signed)
 Problem: Chronic Conditions and Co-morbidities  Goal: Patient's chronic conditions and co-morbidity symptoms are monitored and maintained or improved  Outcome: Progressing  Flowsheets (Taken 05/07/2024 1923)  Care Plan - Patient's Chronic Conditions and Co-Morbidity Symptoms are Monitored and Maintained or Improved:   Monitor and assess patient's chronic conditions and comorbid symptoms for stability, deterioration, or improvement   Collaborate with multidisciplinary team to address chronic and comorbid conditions and prevent exacerbation or deterioration   Update acute care plan with appropriate goals if chronic or comorbid symptoms are exacerbated and prevent overall improvement and discharge     Problem: Discharge Planning  Goal: Discharge to home or other facility with appropriate resources  Outcome: Progressing  Flowsheets (Taken 05/07/2024 1923)  Discharge to home or other facility with appropriate resources:   Identify barriers to discharge with patient and caregiver   Arrange for needed discharge resources and transportation as appropriate   Identify discharge learning needs (meds, wound care, etc)   Refer to discharge planning if patient needs post-hospital services based on physician order or complex needs related to functional status, cognitive ability or social support system     Problem: Pain  Goal: Verbalizes/displays adequate comfort level or baseline comfort level  Outcome: Progressing  Flowsheets (Taken 05/07/2024 1923)  Verbalizes/displays adequate comfort level or baseline comfort level:   Encourage patient to monitor pain and request assistance   Assess pain using appropriate pain scale   Implement non-pharmacological measures as appropriate and evaluate response   Notify Licensed Independent Practitioner if interventions unsuccessful or patient reports new pain   Consider cultural and social influences on pain and pain management   Administer analgesics based on type and severity of pain and  evaluate response  Note: Pt verbalizes pain. Meds administered per [facility]     Problem: Safety - Adult  Goal: Free from fall injury  Outcome: Progressing  Flowsheets (Taken 05/07/2024 1923)  Free From Fall Injury: Instruct family/caregiver on patient safety

## 2024-05-07 NOTE — Progress Notes (Signed)
 PACU Transfer to Floor Note    Procedure(s):  INCISION AND DRAINAGE LEFT HIP    Current Allergies: Lisinopril, Peanuts [peanut oil], Walnut, and Ace inhibitors    Pt meets criteria as per Aldrete Score and ASPAN Standards to transfer to next phase of care.     Recent Labs     05/07/24  1525 05/07/24  1718   POCGLU 175* 172*       Vitals:    05/07/24 1800   BP: 137/84   Pulse: (!) 111   Resp: 11   Temp: 97.1 F (36.2 C)   SpO2: 100%     Vitals within 20% of pt's admission vitals as per ALDRETE SCORE    SpO2: 100 %    O2 Flow Rate (L/min): 2 L/min      Intake/Output Summary (Last 24 hours) at 05/07/2024 1804  Last data filed at 05/07/2024 1700  Gross per 24 hour   Intake 1000 ml   Output 25 ml   Net 975 ml       Pain assessment:  present - adequately treated    Pain Level: 3 (tolerable, resting comfortably.)    Patient was assessed for alterations to skin integrity. There were not alterations observed.    Is patient incontinent: no    Patient has all belongings at discharge from PACU.    Handoff report given at bedside.   Family updated and directed to pt room 6305      05/07/2024 6:04 PM

## 2024-05-07 NOTE — Op Note (Signed)
 Operative Note      Patient: Shelly Richard  Date of Birth: 11/01/1985  MRN: [id number]    Date of Procedure: 28-May-2024    Pre-Op Diagnosis Codes:      * Sepsis, due to unspecified organism, unspecified whether acute organ dysfunction present (HCC) [A41.9]    Post-Op Diagnosis: Same       Procedure(s):  INCISION AND DRAINAGE LEFT HIP    Surgeon(s):  Deretha Charleston, MD    Assistant:   Surgical Assistant: Debrah Ned, RN    Anesthesia: General    Estimated Blood Loss (mL): less than 50     Complications: None    Specimens:   ID Type Source Tests Collected by Time Destination   1 : 1) left hip medius Specimen Joint, Hip CULTURE, SURGICAL, CULTURE, ANAEROBIC AND AEROBIC Deretha Charleston, MD 2024-05-28 1642        Implants:  * No implants in log *    Hemostatic Agents/Irrigation Solution (excluding Saline & Sterile Water  Irrigation):  No hemostatic agent or irrigation used      Drains:   [REMOVED] Closed/Suction Drain Left;Proximal Hip Accordion (Removed)   Site Description Unable to view 04/04/24 2010   Dressing Status Clean, dry & intact 04/04/24 2010   Drainage Appearance Serosanguinous 04/04/24 2010   Drain Status Compressed 04/04/24 2010   Output (ml) 25 ml 04/04/24 0936       [REMOVED] External Urinary Catheter (Removed)   Site Assessment Clean,dry & intact 03/30/24 1800   Placement Replaced 03/30/24 1800   Securement Method Securing device (Describe) 03/30/24 1800   Catheter Care Catheter/Wick replaced;Suction Canister/Tubing changed 03/30/24 1800   Perineal Care Yes 03/30/24 0600   Suction 03/30/24 1800   Urine Color Amber 03/30/24 1800   Urine Appearance Hazy 03/30/24 1800   Urine Odor Malodorous 03/30/24 1800   Output (mL) 1150 mL 03/30/24 1454       Findings:  Present At Time Of Surgery (PATOS) (choose all levels that have infection present):  - Deep Infection (muscle/fascia) present as evidenced by pus  Other Findings: scant pus in troch bursa / glut medius     Detailed Description of  Procedure:     Clinical indications is a 39 year old female who underwent incision and drainage of a left septic trochanteric bursitis about a month ago.  She is noted to have uncontrolled diabetes and was previously in DKA.  She was initially improving but has had persistent elevation of her CRP recently and persistent left hip pain.  Despite nearing the end of IV antibiotic therapy.  Repeat MRI showed continued abscess over the left trope bursa and glue medius and I therefore discussed risk benefits limitations alternatives to repeat incision and drainage in this area and all parties in agreement.    Preoperatively the patient was identified left hip was marked and informed consent was verified.  Last-minute questions were answered.  Patient was taken the operating room where general anesthesia was administered and she was positioned in the lateral position for left hip surgery all bony prominences were well-padded head neck arms were positioned in neutral position.  Timeout was performed according to hospital protocol.    Her previous laterally based incision was utilized.  This incision was made with scalpel and dissected down to the gluteal fascia.  This was incised in line with the incision and bluntly dissected through the glute max down to the trochanteric bursa.  There was some scant purulence in the trochanteric bursa superiorly going into  the glute medius.  This was bluntly opened up with a Cobb elevator throughout the glue medius ensuring all areas were opened and drained.  Purulence was sent for culture.  We then irrigated with 2 L of saline followed by dilute Betadine followed by another liter of saline.  Once satisfied with this I placed quarter-inch iodoform packing into the trochanteric bursa and 1 g of vancomycin  powder here as well.  We then closed the fascia with #1 strata fix.  The subcutaneous tissues were closed with 2-0 Vicryl.  The skin was closed with staples.  The packing was left into the  bandage.  The patient was woken from anesthesia and taken the PACU in stable condition.    Post op plan    -Pull packing Sunday or Monday  -Continue Abx per ID, primary mgmt per medical team  -DM mgmt per medical team  -Pain control  -Activity, WB AT  -d/c planning     Electronically signed by Lamar Carrier, MD on 05/07/2024 at 5:00 PM

## 2024-05-07 NOTE — Progress Notes (Signed)
 V2.0    US[facility] Progress Note      Name:  Shelly Richard DOB/Age/Sex: 01-Jul-1985  (39 y.o. female)   MRN & CSN:  [id number] & [id number] Encounter Date/Time: 05/07/2024 9:58 AM EST   Location:  6305/6305-01 PCP: Alexandro Bernice BRAVO, [name]     Attending:[facility]    Assessment and Recommendations     39 year old female with a history of uncontrolled diabetes mellitus with last A1c of 13.9, recent hospitalization for DKA which was complicated by left hip bursitis requiring I&D and long-term IV antibiotics presenting from home with tachycardia, fever spikes.  Concern for recurrence of left hip bursitis.  Currently with MRI of the left hip showing persistent fasciitis, bursitis, abscess formation along the gluteus medius, lateral upper thigh musculature fossa, greater trochanteric bursa.  Blood cultures are growing MSSA. Due for InD of left hip today 05/07/2024.     Fever  Possible sepsis secondary to left hip bursitis  MSSA bacteremia  - Follow-up Ortho consult.  -Continue IV Ancef .  - ID consult.  Discussed with ID.   -Continue to follow repeat blood cultures  - TTE without significant findings.     Hypothyroidism  - Continue Synthroid .    Uncontrolled diabetes mellitus 13.9% last A1c  - Recheck A1c better control 8.5%.  - Continue Lantus  40 units nightly.  Continue sliding scale as ordered. Continue prandials.     Hypertension  - Continue losartan     Microcytic anemia  - Continue iron  supplementation.    Vaginal discharge  -Send for UA.     Diet Diet NPO   DVT Prophylaxis [x]  Lovenox , []   Heparin, []  SCDs, []  Ambulation,  []  Eliquis, []  Xarelto  []  Coumadin   Code Status Full Code   Disposition From: Home  Expected Disposition: tbd  Estimated Date of Discharge: tbd  Patient requires continued admission due to Sepsis   Surrogate Decision Maker/ POA  [name] listed as primary contact.      Personally reviewed Lab Studies and Imaging     Discussed management of  the case with case management.  I reviewed IDs progress note.        Subjective:     Patient seen and evaluated sitting in chair.       Review of Systems:      Pertinent positives and negatives discussed in HPI    Objective:     Intake/Output Summary (Last 24 hours) at 05/07/2024 0834  Last data filed at 05/06/2024 1455  Gross per 24 hour   Intake 240 ml   Output --   Net 240 ml        Vitals:   Vitals:    05/06/24 1744 05/06/24 2113 05/06/24 2331 05/07/24 0557   BP: 118/78 114/81 113/76 127/67   Pulse: 96 (!) 108 (!) 108 98   Resp:  16 18 16    Temp: 98.1 F (36.7 C) 98.2 F (36.8 C) 98.4 F (36.9 C) 97.9 F (36.6 C)   TempSrc: Oral Oral Oral Oral   SpO2: 100% 100% 98% 97%   Weight:       Height:             Physical Exam:      General: NAD  Eyes: EOMI  ENT: neck supple  Cardiovascular: Tachycardic.   Respiratory: Bilateral symmetric expansion  Gastrointestinal: Active bowel sounds.   Genitourinary: no suprapubic tenderness  Musculoskeletal: No edema  Skin: warm, dry  Neuro: Oriented x3.  Psych: Mood appropriate.         Medications:   Medications:    insulin  lispro  5 Units SubCUTAneous TID WC    ceFAZolin   2,000 mg IntraVENous q8h    ferrous sulfate   325 mg Oral Daily with breakfast    insulin  glargine  40 Units SubCUTAneous Nightly    levothyroxine   25 mcg Oral Daily    losartan   25 mg Oral Daily    atorvastatin   20 mg Oral Nightly    sodium chloride  flush  5-40 mL IntraVENous 2 times per day    enoxaparin   40 mg SubCUTAneous Daily    sodium chloride  flush  5-40 mL IntraVENous 2 times per day    insulin  lispro  0-8 Units SubCUTAneous 4x Daily AC & HS      Infusions:    sodium chloride       sodium chloride       dextrose        PRN Meds: sulfur  hexafluoride microspheres, 2 mL, ONCE PRN  oxyCODONE , 5 mg, Q6H PRN  sodium chloride  flush, 5-40 mL, PRN  sodium chloride , , PRN  magnesium  sulfate, 2,000 mg, PRN  ondansetron , 4 mg, Q8H PRN   Or  ondansetron , 4 mg, Q6H PRN  polyethylene glycol, 17 g, Daily  PRN  acetaminophen , 650 mg, Q6H PRN   Or  acetaminophen , 650 mg, Q6H PRN  sodium chloride  flush, 5-40 mL, PRN  sodium chloride , , PRN  glucose, 4 tablet, PRN  dextrose  bolus, 125 mL, PRN   Or  dextrose  bolus, 250 mL, PRN  glucagon , 1 mg, PRN  dextrose , , Continuous PRN        Labs and Imaging   XR HIP 2-3 VW W PELVIS LEFT  Result Date: 05/03/2024  XR HIP 2-3 VW W PELVIS LEFT Indication: Left hip pain. History trochanteric bursitis. COMPARISON: 03/31/2024 Findings: Frontal view the pelvis and frog-leg lateral view of the left hip were obtained. The bones are well-mineralized. There is no acute fracture or dislocation. The regional soft tissues are unremarkable.     Impression: No acute osseous abnormality. Electronically signed by Belvie Plana, MD    XR CHEST PORTABLE  Result Date: 05/03/2024  XR CHEST PORTABLE Indication: Cough COMPARISON: 03/27/2024 Findings: Portable AP upright view of the chest was obtained. The heart is normal in size and configuration. The lungs are clear. There is no effusion or pneumothorax.     Impression: No acute cardiopulmonary abnormality. Electronically signed by Belvie Plana, MD      CBC:   Recent Labs     05/05/24  0645 05/06/24  0740 05/07/24  0516   WBC 10.7 11.4* 9.4   HGB 9.7* 9.9* 10.4*   PLT 376 372 399     BMP:    Recent Labs     05/05/24  0645 05/06/24  0740 05/07/24  0516   NA 140 138 138   K 3.1* 3.3* 3.7   CL 101 101 102   CO2 27 25 25    BUN 6* 11 13   CREATININE 0.6 0.7 0.7   GLUCOSE 153* 190* 139*     Hepatic:   No results for input(s): AST, ALT, BILITOT, ALKPHOS in the last 72 hours.    Invalid input(s): ALB    Lipids:   Lab Results   Component Value Date/Time    CHOL 154 11/25/2023 02:44 PM    HDL 22 11/25/2023 02:44 PM    TRIG 170  11/25/2023 02:44 PM     Hemoglobin A1C:   Lab Results   Component Value Date/Time    LABA1C 8.5 05/04/2024 10:40 AM     TSH:   Lab Results   Component Value Date/Time    TSH 1.92 08/27/2022 04:35 PM     Troponin: No results found  for: TROPONINT  Lactic Acid: No results for input(s): LACTA in the last 72 hours.  BNP: No results for input(s): PROBNP in the last 72 hours.  UA:  Lab Results   Component Value Date/Time    NITRU Negative 05/03/2024 04:37 PM    COLORU Yellow 05/03/2024 04:37 PM    PHUR 7.0 05/03/2024 04:37 PM    PHUR 5.5 02/25/2017 12:22 PM    WBCUA 10-20 05/03/2024 04:37 PM    RBCUA 51-100 05/03/2024 04:37 PM    TRICHOMONAS Present 05/03/2024 04:37 PM    BACTERIA 3+ 03/28/2024 02:03 AM    CLARITYU Clear 05/03/2024 04:37 PM    LEUKOCYTESUR SMALL 05/03/2024 04:37 PM    UROBILINOGEN 0.2 05/03/2024 04:37 PM    BILIRUBINUR Negative 05/03/2024 04:37 PM    BLOODU LARGE 05/03/2024 04:37 PM    GLUCOSEU 100 05/03/2024 04:37 PM    KETUA Negative 05/03/2024 04:37 PM     Urine Cultures:   Lab Results   Component Value Date/Time    LABURIN 25,000 CFU/ml  PBP2= Negative   03/28/2024 01:54 AM     Blood Cultures:   Lab Results   Component Value Date/Time    [facility]  05/05/2024 02:20 PM     No Growth to date.  Any change in status will be called.     Lab Results   Component Value Date/Time    BLOODCULT2  05/05/2024 02:20 PM     No Growth to date.  Any change in status will be called.     Organism:   Lab Results   Component Value Date/Time    ORG Staph aureus DNA Detected 05/03/2024 03:10 PM    ORG Staphylococcus aureus 05/03/2024 03:10 PM         Electronically signed by Sheree CHRISTELLA Evern Marylyn, MD on 05/07/2024 at 8:34 AM

## 2024-05-07 NOTE — Progress Notes (Signed)
 Pt left unit for OR

## 2024-05-07 NOTE — Anesthesia Postprocedure Evaluation (Signed)
 Department of Anesthesiology  Postprocedure Note    Patient: Shelly Richard  MRN: [id number]  Birthdate: Jun 08, 1985  Date of evaluation: 05/07/2024    Procedure Summary       Date: 05/07/24 Room / Location: [facility] OR 11 / [facility]    Anesthesia Start: 1608 Anesthesia Stop: 1715    Procedure: INCISION AND DRAINAGE LEFT HIP (Left) Diagnosis:       Sepsis, due to unspecified organism, unspecified whether acute organ dysfunction present (HCC)      (Sepsis, due to unspecified organism, unspecified whether acute organ dysfunction present (HCC) [A41.9])    Surgeons: Deretha Charleston, MD Responsible Provider: Lary Lonni ORN, DO    Anesthesia Type: general ASA Status: 3            Anesthesia Type: No value filed.    Aldrete Phase I: Aldrete Score: 9    Aldrete Phase II:      Vitals:    05/07/24 1815   BP: (!) 144/89   Pulse: (!) 109   Resp: 12   Temp: 97.7 F (36.5 C)   SpO2: 100%       Anesthesia Post Evaluation    Patient location during evaluation: bedside  Patient participation: complete - patient participated  Level of consciousness: awake and awake and alert  Pain score: 4  Airway patency: patent  Nausea & Vomiting: no nausea and no vomiting  Cardiovascular status: hemodynamically stable  Respiratory status: acceptable  Hydration status: euvolemic  Pain management: adequate and satisfactory to patient      No notable events documented.

## 2024-05-07 NOTE — Anesthesia Pre Procedure (Signed)
 Department of Anesthesiology  Preprocedure Note       Name:  Shelly Richard   Age:  39 y.o.  DOB:  11/19/1985                                          MRN:  [id number]         Date:  05/07/2024      Surgeon: Clotilde):  Deretha Charleston, MD    Procedure: Procedure(s):  INCISION AND DRAINAGE LEFT HIP    Medications prior to admission:   Prior to Admission medications   Medication Sig Start Date End Date Taking? Authorizing Provider   DAPTOmycin  (CUBICIN ) infusion Infuse 400 mg intravenously in the morning for 11 days. Compound per protocol. 05/03/24 05/14/24 Yes He, Peimei, MD   fluconazole  (DIFLUCAN ) 150 MG tablet Take 1 tablet by mouth every 3 days 04/30/24  Yes He, Peimei, MD   levothyroxine  (SYNTHROID ) 25 MCG tablet Take 1 tablet by mouth Daily 04/20/24  Yes Osterbrock, Erika E, [name]   simvastatin  (ZOCOR ) 40 MG tablet Take 1 tablet by mouth daily 04/20/24  Yes Osterbrock, Erika E, [name]   losartan  (COZAAR ) 25 MG tablet TAKE ONE TABLET BY MOUTH DAILY 04/20/24  Yes Osterbrock, Erika E, [name]   Semaglutide , 1 MG/DOSE, (OZEMPIC , 1 MG/DOSE,) 4 MG/3ML SOPN sc injection Inject 1 mg into the skin every 7 days 04/20/24  Yes Alexandro Bernice BRAVO, [name]   metFORMIN  (GLUCOPHAGE -XR) 500 MG extended release tablet Take 4 tablets by mouth daily (with breakfast) 04/20/24  Yes Osterbrock, Erika E, [name]   ferrous sulfate  (FEROSUL) 325 (65 Fe) MG tablet Take 1 tablet by mouth daily (with breakfast) 04/20/24  Yes Osterbrock, Erika E, [name]   cetirizine  (ZYRTEC ) 10 MG tablet Take 1 tablet by mouth daily 04/20/24  Yes Osterbrock, Erika E, [name]   montelukast  (SINGULAIR ) 10 MG tablet Take 1 tablet by mouth daily 04/20/24  Yes Osterbrock, Erika E, [name]   Cholecalciferol (VITAMIN D3) 50 MCG (2000 UT) TABS TAKE ONE TABLET BY MOUTH DAILY 04/20/24  Yes Osterbrock, Erika E, [name]   albuterol  sulfate HFA (PROVENTIL ;VENTOLIN ;PROAIR ) 108 (90 Base) MCG/ACT inhaler Inhale 2 puffs into the lungs every 6 hours  as needed for Wheezing or Shortness of Breath 04/20/24  Yes Alexandro Bernice BRAVO, [name]   insulin  glargine (LANTUS  SOLOSTAR) 100 UNIT/ML injection pen Inject 40 Units into the skin nightly 04/07/24  Yes Rodriguez Galliano, Sheree HERO, MD   insulin  lispro, 1 Unit Dial , (HUMALOG  KWIKPEN) 100 UNIT/ML SOPN Inject 10 Units into the skin 3 times daily (before meals) INJECT 10 UNITS UNDER THE SKIN three times WITH MEALS PLUS INJECT 2 UNITS FOR BLOOD SUGAR 150-200, 4 UNITS IF 201-250, 6 UNITS IF 251-300, 8 UNITS IF 301-350, 10 UNITS IF GREATER THAN 350. 04/07/24  Yes Evern Aid, Sheree HERO, MD   aspirin  81 MG EC tablet Take 1 tablet by mouth in the morning and at bedtime 04/07/24  Yes Rodriguez Galliano, Sheree HERO, MD   blood glucose monitor strips Tes2t qam  times a day & as needed for symptoms of irregular blood glucose. Please provide test strips approved by insurance. 12/29/19  Yes Alexandro Bernice BRAVO, [name]   Continuous Glucose Sensor (FREESTYLE LIBRE 3 PLUS SENSOR) MISC Apply every 15 days  Patient not taking: Reported on 05/03/2024 04/20/24   Alexandro Bernice BRAVO, [name]   Insulin  Pen Needle (DROPLET PEN NEEDLES) 32G X 6 MM MISC USE ONCE DAILY  Patient not taking: Reported on 03/27/2024 07/09/22   Osterbrock, Erika E, [name]   ibuprofen  (ADVIL ;MOTRIN ) 600 MG tablet Take 1 tablet by mouth every 6 hours as needed for Pain 02/01/22 04/20/24  Foley, Chauncey Sharper, MD   acetaminophen  (TYLENOL ) 500 MG tablet Take 1 tablet by mouth 4 times daily as needed for Pain 02/01/22 04/20/24  Foley, Chauncey Sharper, MD   Insulin  Pen Needle 32G X 6 MM MISC Use four times daily and as needed  Patient not taking: Reported on 03/27/2024 06/12/21   Alexandro Bernice BRAVO, [name]   EPINEPHrine  (EPIPEN ) 0.3 MG/0.3ML SOAJ injection  04/05/21   [provider]   Insulin  Pen Needle ([employer] PEN NEEDLES 31G) 31G X 8 MM MISC 1 each by Does not apply route daily  Patient not taking: Reported on 03/27/2024 02/10/19   Alexandro Bernice BRAVO, APRN  - CNP   Lancets MISC 1 each by Does not apply route daily Please provide lancets approved by insurance.  Patient not taking: Reported on 03/27/2024 02/16/18   Tussey, Rocio G, MD   Blood Glucose Monitoring Suppl [facility] BLOOD GLUCOSE METER) w/Device KIT Please provide meter approved by insurance.  Check bs qam 02/16/18   Tussey, Rocio G, MD       Current medications:    Current Facility-Administered Medications   Medication Dose Route Frequency Provider Last Rate Last Admin    sulfur  hexafluoride microspheres (LUMASON ) 60.7-25 MG injection 2 mL  2 mL IntraVENous ONCE PRN He, Peimei, MD        oxyCODONE  (ROXICODONE ) immediate release tablet 5 mg  5 mg Oral Q6H PRN Rodriguez Galliano, Luis M, MD   5 mg at 05/07/24 0901    insulin  lispro (HUMALOG ,ADMELOG ) injection vial 5 Units  5 Units SubCUTAneous TID WC Rodriguez Galliano, Luis M, MD   5 Units at 05/07/24 9158    ceFAZolin  (ANCEF ) 2,000 mg in sterile water  20 mL IV syringe  2,000 mg IntraVENous q8h He, Peimei, MD   2,000 mg at 05/07/24 1342    ferrous sulfate  (IRON  325) tablet 325 mg  325 mg Oral Daily with breakfast Gunawardena, Chalana U, MD   325 mg at 05/07/24 0847    insulin  glargine (LANTUS ) injection vial 40 Units  40 Units SubCUTAneous Nightly Gunawardena, Chalana U, MD   40 Units at 05/06/24 2134    levothyroxine  (SYNTHROID ) tablet 25 mcg  25 mcg Oral Daily Gunawardena, Chalana U, MD   25 mcg at 05/07/24 0600    losartan  (COZAAR ) tablet 25 mg  25 mg Oral Daily Gunawardena, Chalana U, MD   25 mg at 05/07/24 0841    atorvastatin  (LIPITOR ) tablet 20 mg  20 mg Oral Nightly Rodriguez Galliano, Luis M, MD   20 mg at 05/06/24 2110    sodium chloride  flush 0.9 % injection 5-40 mL  5-40 mL IntraVENous 2 times per day Gunawardena, Chalana U, MD   10 mL at 05/05/24 1000    sodium chloride  flush 0.9 % injection 5-40 mL  5-40 mL IntraVENous PRN Gunawardena, Chalana U, MD        0.9 % sodium chloride  infusion   IntraVENous PRN Gunawardena, Chalana U, MD        magnesium   sulfate 2000 mg in 50 mL IVPB premix  2,000 mg IntraVENous  PRN Gunawardena, Chalana U, MD        enoxaparin  (LOVENOX ) injection 40 mg  40 mg SubCUTAneous Daily Gunawardena, Chalana U, MD   40 mg at 05/07/24 9157    ondansetron  (ZOFRAN -ODT) disintegrating tablet 4 mg  4 mg Oral Q8H PRN Gunawardena, Chalana U, MD        Or    ondansetron  (ZOFRAN ) injection 4 mg  4 mg IntraVENous Q6H PRN Gunawardena, Chalana U, MD        polyethylene glycol (GLYCOLAX ) packet 17 g  17 g Oral Daily PRN Gunawardena, Chalana U, MD        acetaminophen  (TYLENOL ) tablet 650 mg  650 mg Oral Q6H PRN Gunawardena, Chalana U, MD   650 mg at 05/06/24 9482    Or    acetaminophen  (TYLENOL ) suppository 650 mg  650 mg Rectal Q6H PRN Gunawardena, Chalana U, MD        sodium chloride  flush 0.9 % injection 5-40 mL  5-40 mL IntraVENous 2 times per day Gunawardena, Chalana U, MD   10 mL at 05/07/24 0842    sodium chloride  flush 0.9 % injection 5-40 mL  5-40 mL IntraVENous PRN Gunawardena, Chalana U, MD        0.9 % sodium chloride  infusion   IntraVENous PRN Gunawardena, Chalana U, MD        glucose chewable tablet 16 g  4 tablet Oral PRN Gunawardena, Chalana U, MD        dextrose  bolus 10% 125 mL  125 mL IntraVENous PRN Gunawardena, Chalana U, MD        Or    dextrose  bolus 10% 250 mL  250 mL IntraVENous PRN Gunawardena, Chalana U, MD        glucagon  injection 1 mg  1 mg SubCUTAneous PRN Gunawardena, Chalana U, MD        dextrose  10 % infusion   IntraVENous Continuous PRN Gunawardena, Chalana U, MD        insulin  lispro (HUMALOG ,ADMELOG ) injection vial 0-8 Units  0-8 Units SubCUTAneous 4x Daily AC & HS Gunawardena, Chalana U, MD   2 Units at 05/07/24 1143       Allergies:    Allergies   Allergen Reactions    Lisinopril Other (See Comments) and Angioedema     Swollen lips    Peanuts [Peanut Oil] Shortness Of Breath     THROAT ITCHES - Walnuts, PEANUTS     Walnut Shortness Of Breath     THROAT ITCHES - Walnuts, PEANUTS     Ace Inhibitors Swelling        Problem List:    Patient Active Problem List   Diagnosis Code    Mixed hyperlipidemia E78.2    Iron  deficiency anemia D50.9    Type 2 diabetes mellitus with hyperglycemia, with long-term current use of insulin  (HCC) E11.65, Z79.4    Essential hypertension I10    Acquired hypothyroidism E03.9    Mild intermittent asthma without complication J45.20    Allergic rhinitis J30.9    Heart murmur R01.1    Uterine fibroid D25.9    Class 1 obesity due to excess calories with serious comorbidity and body mass index (BMI) of 31.0 to 31.9 in adult E66.811, E66.09, Z68.31    Abnormal uterine bleeding N93.9    Eczema L30.9    Glaucoma suspect, bilateral H40.003    Hidradenitis axillaris L73.2    HSV-2 infection B00.9    Myopic astigmatism, bilateral H52.203, H52.13    Strabismus H50.9  S/P myomectomy Z98.890    DKA, type 2, not at goal [facility]) E11.10    Acute nonintractable headache R51.9    MSSA bacteremia R78.81, B95.61    Diabetic ketoacidosis without coma associated with type 2 diabetes mellitus (HCC) E11.10    Greater trochanteric bursitis of left hip M70.62    Abscess of bursa of left hip M71.052    Sepsis (HCC) A41.9    Type 2 diabetes mellitus with other specified complication (HCC) E11.69       Past Medical History:        Diagnosis Date    Acute headache     Allergies     Asthma     Diabetes mellitus (HCC)     Genital herpes     HTN (hypertension)     Hyperlipidemia     Hypothyroid     Urticaria        Past Surgical History:        Procedure Laterality Date    CT FLUID COLLECTION DRAINAGE SOFT TISSUE  04/01/2024    CT FLUID COLLECTION DRAINAGE SOFT TISSUE 04/01/2024 [facility] CT SCAN    CYST REMOVAL      HIP SURGERY Left 04/02/2024    INCISION AND DRAINAGE OF LEFT HIP GREATER TROCHANTERIC BURSA performed by Sherline Lamar Gaskins, MD at [facility] OR    HYSTEROSCOPY      x2    MYOMECTOMY N/A 02/01/2022    ROBOTIC MYOMECTOMY performed by Foley, Chauncey Sharper, MD at [facility] OR       Social History:    Social History     Tobacco Use     Smoking status: Never    Smokeless tobacco: Never   Substance Use Topics    Alcohol use: Never                                Counseling given: Not Answered      Vital Signs (Current):   Vitals:    05/06/24 2331 05/07/24 0557 05/07/24 0839 05/07/24 1341   BP: 113/76 127/67 130/76 120/87   Pulse: (!) 108 98 (!) 104 (!) 106   Resp: 18 16 16 18    Temp: 98.4 F (36.9 C) 97.9 F (36.6 C) 98.6 F (37 C) 97.5 F (36.4 C)   TempSrc: Oral Oral Oral Axillary   SpO2: 98% 97% 96% 100%   Weight:       Height:                                                  BP Readings from Last 3 Encounters:   05/07/24 120/87   04/20/24 136/70   04/14/24 (!) 158/99       NPO Status: Time of last liquid consumption: 2300                        Time of last solid consumption: 2300                        Date of last liquid consumption: 05/06/24                        Date of last solid food consumption: 05/06/24    BMI:  Wt Readings from Last 3 Encounters:   05/06/24 75.3 kg (166 lb)   04/20/24 77.5 kg (170 lb 12.8 oz)   04/04/24 93.5 kg (206 lb 2.1 oz)     Body mass index is 28.49 kg/m.    CBC:   Lab Results   Component Value Date/Time    WBC 9.4 05/07/2024 05:16 AM    RBC 3.83 05/07/2024 05:16 AM    HGB 10.4 05/07/2024 05:16 AM    HCT 31.1 05/07/2024 05:16 AM    MCV 81.3 05/07/2024 05:16 AM    RDW 15.9 05/07/2024 05:16 AM    PLT 399 05/07/2024 05:16 AM       CMP:   Lab Results   Component Value Date/Time    NA 138 05/07/2024 05:16 AM    K 3.7 05/07/2024 05:16 AM    K 2.9 05/04/2024 05:07 AM    CL 102 05/07/2024 05:16 AM    CO2 25 05/07/2024 05:16 AM    BUN 13 05/07/2024 05:16 AM    CREATININE 0.7 05/07/2024 05:16 AM    GFRAA >60 08/11/2019 12:18 PM    AGRATIO 0.7 05/04/2024 05:07 AM    LABGLOM >90 05/07/2024 05:16 AM    LABGLOM >60 02/02/2022 05:10 AM    GLUCOSE 139 05/07/2024 05:16 AM    CALCIUM  9.5 05/07/2024 05:16 AM    BILITOT 0.6 05/04/2024 05:07 AM    ALKPHOS 152 05/04/2024 05:07 AM    AST 10 05/04/2024 05:07 AM    ALT <5 05/04/2024  05:07 AM       POC Tests:   Recent Labs     05/07/24  1131   POCGLU 191*       Coags:   Lab Results   Component Value Date/Time    PROTIME 16.8 05/03/2024 02:17 PM    INR 1.34 05/03/2024 02:17 PM       HCG (If Applicable):   Lab Results   Component Value Date    PREGTESTUR Negative 11/15/2023    PREGSERUM Negative 05/07/2024        ABGs: No results found for: PHART, PO2ART, PCO2ART, HCO3ART, BEART, O2SATART     Type & Screen (If Applicable):  Lab Results   Component Value Date    ABORH B POS 04/02/2024    LABANTI NEG 04/02/2024       Drug/Infectious Status (If Applicable):  No results found for: HIV, HEPCAB    COVID-19 Screening (If Applicable):   Lab Results   Component Value Date/Time    COVID19 NOT DETECTED 05/03/2024 03:21 PM    COVID19 DETECTED 04/22/2019 02:15 PM           Anesthesia Evaluation    Patient summary reviewed     no history of anesthetic complications:  Airway:  Mallampati: III  TM distance: >3 FB   Neck ROM: full    Mouth opening: > = 3 FB   Dental:  normal exam         Pulmonary:     breath sounds clear to auscultation  (+)       asthma (hasn't needed inhaler in years):                           (-) rhonchi, wheezes, not a current smoker and no decreased breath sounds         Cardiovascular:     Exercise tolerance: good (>4 METS)    (+)     hypertension:                                        (-)  past MI and murmur      Rhythm: regular  Rate: normal                Neuro/Psych:       (-) seizures, TIA and CVA          GI/Hepatic/Renal:    (+)           obesity:     (-) liver disease and no renal disease     Endo/Other:     (+) Diabetes: Type II DM, well controlled, using insulin   :  blood dyscrasia: anemia  :                     Abdominal:    (+) obese            Vascular:         Other Findings:            Anesthesia Plan      general     ASA 3       Induction: intravenous.      Anesthetic plan and risks discussed with patient.    Use of blood products discussed with patient whom  consented to blood products.    Plan discussed with CRNA.    Attending anesthesiologist reviewed and agrees with Preprocedure content                Lonni ORN Arjan Strohm, DO   05/07/2024

## 2024-05-07 NOTE — Care Coordination (Addendum)
 DISCHARGE PLANNING:    Chart Reviewed:    Admitted with Sepsis:     -   Pt is IPTA and plans to return home at discharge. Had PICC line removed  ,  has  PIV and will likely beed  new PICC and  IV atbx at  d/c.  Per  ID      Active  with  Home [facility] and HHC :    [address]  [address], [address], [place], [place] [address]  [phone]  [phone]    [facility] - [address]  [address] [place], [place] [address]  [phone]  [phone]    -  ID following. If still needs IV abx at discharge will resume services if not, pt is IPTA and states her boyfriend will transport her home.  -   Pt is active with [facility] and [facility].      BARRIERS:    Ortho Surg:   PLAN:      WBAT :   plan to take back to OR today; for formal I&D of the left hip again.  NPO after MN.      :PER ID  CM sent  message to [facility] to Run Benefits  pro actively .  - pt going to OR today at 1600, but ID, Dr He said we can run benefits for IV Cefazolin  6 grams IV continous for 4-6 weeks depending if  just Bursa involvement 4 weeks , if Joint then 6 weeks. depending on the cultures.     UPDATE:  Response: [id number]: Accept Thanks for the update. Checking benefits.   Electronically signed by Darice Croft, RN on 05/07/2024 at 12:34 PM         The Plan for Transition of Care is related to the following treatment goals of Sepsis (HCC) [A41.9]  Sepsis without acute organ dysfunction, due to unspecified organism [facility]) [A41.9]      CM cont to follow     Electronically signed by Darice Croft, RN on 05/07/2024 at 9:09 AM       Darice Croft  RN Case Manager  [facility]  [address]  [place] [address]  [phone]  Fax [phone]

## 2024-05-07 NOTE — Progress Notes (Signed)
 Pt returned to the unit.

## 2024-05-07 NOTE — Progress Notes (Signed)
 ID Follow-up NOTE    CC:   Fever, tachycardia   Antibiotics: cefazolin     Admit Date: 05/03/2024  Hospital Day: 5    Subjective:     Patient has been afebrile, continues to have left hip discomfort with movement. Planned OR today for washout.       Objective:     Patient Vitals for the past 8 hrs:   BP Temp Temp src Pulse Resp SpO2   05/07/24 1341 120/87 97.5 F (36.4 C) Axillary (!) 106 18 100 %   05/07/24 0839 130/76 98.6 F (37 C) Oral (!) 104 16 96 %     I/O last 3 completed shifts:  In: 240 [P.O.:240]  Out: -   No intake/output data recorded.    EXAM:  GENERAL: No apparent distress.    HEENT: Membranes moist, no oral lesion  NECK:  Supple, no lymphadenopathy  LUNGS: Clear b/l, no rales, no dullness  CARDIAC: RRR  ABD:  + BS, soft / NT  EXT:   Left hip incision is well-healed without any surrounding induration or redness evident, slight tenderness to palpation in all weightbearing  NEURO: No focal neurologic findings  PSYCH: Orientation, sensorium, mood normal  LINES:  Peripheral iv, PICC line has been removed.  No induration at this previous site.       Data Review:  Lab Results   Component Value Date    WBC 9.4 05/07/2024    HGB 10.4 (L) 05/07/2024    HCT 31.1 (L) 05/07/2024    MCV 81.3 05/07/2024    PLT 399 05/07/2024     Lab Results   Component Value Date    CREATININE 0.7 05/07/2024    BUN 13 05/07/2024    NA 138 05/07/2024    K 3.7 05/07/2024    CL 102 05/07/2024    CO2 25 05/07/2024       Hepatic Function Panel:   Lab Results   Component Value Date/Time    ALKPHOS 152 05/04/2024 05:07 AM    ALT <5 05/04/2024 05:07 AM    AST 10 05/04/2024 05:07 AM    BILITOT 0.6 05/04/2024 05:07 AM    BILIDIR 0.2 05/03/2024 02:17 PM    IBILI 0.4 05/03/2024 02:17 PM       MICRO:    Blood cultures x 2 1/21: No growth to date    Blood cultures x 2 1/19: 1 set positive for MSSA  Staphylococcus aureus (2)    Antibiotic Interpretation MIC  Method Status    clindamycin  Sensitive <=0.25 mcg/mL BACTERIAL SUSCEPTIBILITY PANEL BY  MIC     erythromycin  Sensitive <=0.25 mcg/mL BACTERIAL SUSCEPTIBILITY PANEL BY MIC     levofloxacin Resistant 4 mcg/mL BACTERIAL SUSCEPTIBILITY PANEL BY MIC     linezolid Sensitive 2 mcg/mL BACTERIAL SUSCEPTIBILITY PANEL BY MIC     oxacillin Sensitive 0.5 mcg/mL BACTERIAL SUSCEPTIBILITY PANEL BY MIC     tetracycline Sensitive <=1 mcg/mL BACTERIAL SUSCEPTIBILITY PANEL BY MIC     trimethoprim-sulfamethoxazole Sensitive <=10 mcg/mL BACTERIAL SUSCEPTIBILITY PANEL BY MIC     vancomycin  Sensitive 1 mcg/mL BACTERIAL SUSCEPTIBILITY PANEL BY MIC          Blood cultures x 2 12/21: Neg      OR culture left hip bursa 12/19: Rare growth Staph aureus, MSSA     Blood cultures x 2 12/16: No growth     Left hip bursa aspirate culture 12/18: Moderate growth MSSA     Blood cultures x 2 12/15: Positive for  MSSA     Blood cultures x 2 12/13: Positive for MSSA  Staphylococcus aureus (2)     Antibiotic Interpretation MIC   Method Status     clindamycin  Sensitive <=0.25 mcg/mL BACTERIAL SUSCEPTIBILITY PANEL BY MIC       erythromycin  Sensitive <=0.25 mcg/mL BACTERIAL SUSCEPTIBILITY PANEL BY MIC       levofloxacin Resistant 4 mcg/mL BACTERIAL SUSCEPTIBILITY PANEL BY MIC       linezolid Sensitive 2 mcg/mL BACTERIAL SUSCEPTIBILITY PANEL BY MIC       oxacillin Sensitive 0.5 mcg/mL BACTERIAL SUSCEPTIBILITY PANEL BY MIC       tetracycline Sensitive <=1 mcg/mL BACTERIAL SUSCEPTIBILITY PANEL BY MIC         Tetracyclines have decreased renal elimination and  urinary bladder concentrations.  Use of this class of  agents for urinary tract infection should be limited to  patients that cannot tolerate an alternative therapy.           trimethoprim-sulfamethoxazole Sensitive <=10 mcg/mL BACTERIAL SUSCEPTIBILITY PANEL BY MIC       vancomycin  Sensitive 1 mcg/mL BACTERIAL SUSCEPTIBILITY PANEL BY MIC          Urine culture 12/14: 25,000 CFU MSSA      IMAGING: I have independently reviewed the images and reports.       MRI left hip with and without contrast  1/20:  1. Persistent fasciitis, bursitis, myositis and abscess formation along the gluteus medius, the lateral upper thigh musculature fascia, and along the greater trochanteric bursa. The abscess collection has decreased in size. The extent of inflammation   appears to be mildly improved in comparison to the prior exam.     Portable chest x-ray 1/19:  No acute cardiopulmonary abnormality     X-ray left hip and pelvis 1/19:  No acute osseous abnormality.     MRI left hip with and without contrast 12/17:  1. There is a 4.6 cm left greater trochanter bursal fluid collection with thin tract which extends superiorly into the left gluteus medius muscle. There is extensive surrounding edema as well as edema within the left gluteus medius muscle, and lateral   aspect of the adjacent left gluteus maximus muscle. In the setting of infection this is most worrisome for a septic bursitis-abscess.  2. No evidence of any osteomyelitis.  3. No acute osseous abnormality.        Scheduled Meds:   insulin  lispro  5 Units SubCUTAneous TID WC    ceFAZolin   2,000 mg IntraVENous q8h    ferrous sulfate   325 mg Oral Daily with breakfast    insulin  glargine  40 Units SubCUTAneous Nightly    levothyroxine   25 mcg Oral Daily    losartan   25 mg Oral Daily    atorvastatin   20 mg Oral Nightly    sodium chloride  flush  5-40 mL IntraVENous 2 times per day    enoxaparin   40 mg SubCUTAneous Daily    sodium chloride  flush  5-40 mL IntraVENous 2 times per day    insulin  lispro  0-8 Units SubCUTAneous 4x Daily AC & HS       Continuous Infusions:   sodium chloride       sodium chloride       dextrose          PRN Meds:  sulfur  hexafluoride microspheres, oxyCODONE , sodium chloride  flush, sodium chloride , magnesium  sulfate, ondansetron  **OR** ondansetron , polyethylene glycol, acetaminophen  **OR** acetaminophen , sodium chloride  flush, sodium chloride , glucose, dextrose  bolus **OR** dextrose  bolus, glucagon , dextrose   Assessment:       Patient Active  Problem List   Diagnosis    Mixed hyperlipidemia    Iron  deficiency anemia    Type 2 diabetes mellitus with hyperglycemia, with long-term current use of insulin  (HCC)    Essential hypertension    Acquired hypothyroidism    Mild intermittent asthma without complication    Allergic rhinitis    Heart murmur    Uterine fibroid    Class 1 obesity due to excess calories with serious comorbidity and body mass index (BMI) of 31.0 to 31.9 in adult    Abnormal uterine bleeding    Eczema    Glaucoma suspect, bilateral    Hidradenitis axillaris    HSV-2 infection    Myopic astigmatism, bilateral    Strabismus    S/P myomectomy    DKA, type 2, not at goal [facility])    Acute nonintractable headache    MSSA bacteremia    Diabetic ketoacidosis without coma associated with type 2 diabetes mellitus (HCC)    Greater trochanteric bursitis of left hip    Abscess of bursa of left hip    Sepsis (HCC)    Type 2 diabetes mellitus with other specified complication [facility])        Plan:   38 year old African-American female with history of DM2, HTN, HLD that presented on 03/27/24 with headache and shortness of breath. She was found to be in DKA and was admitted to the ICU.  During that admission she was also found to have MSSA bacteremia and left hip greater trochanteric bursitis. She is on IV abx course with daptomycin  and here with fevers and tachycardia     MSSA bacteremia and left hip greater trochanteric bursitis:  - hx of recent admission here on 03/2024 in the ICU with DKA, was also found to have MSSA bacteremia with left hip trochanteric greater bursitis  - previous MRI hip on  03/31/2024 showed 4.6cm left greater trochanteric bursal fluid collection.  This was aspirated with IR and yielded growth of MSSA.    - orthopedics performed operative washout of the bursa on 04/02/24 with operative cultures showing growth of the same.  -Patient has been on a course of IV daptomycin  now doing this at home.  We initially had to choose a once a day  antibiotic as patient was receiving charity care via [facility] and therefore was DC'd on daptomycin . This was initially planned to end on 1/16 however due to persistently elevated inflammatory markers, was extended to 1/30, and had planned OP repeat MRI hip.   -Now admitted on 1/19 with fever and tachycardia  - WBC was 12.3 and Tmax of 101.8.  ESR/CRP was 94/126 respectively.  She has been resumed on daptomycin   - Blood cultures x 2 taken on admission from 1/19 now with one set positive for MSSA.    -Patient denies any new complaints besides left hip tenderness, states slight pain on bearing weight and sometimes gives out.    - MRI left hip on this admission showing persistent fasciitis, bursitis, myositis and abscess formation along the gluteus medius, the lateral upper thigh musculature fascia, and along the greater trochanteric bursa. The abscess collection has decreased in size.   - ortho eval, Dr.Erlichmann to perform washout today, will appreciate cx to be obtained at that time. Follow for operative findings.   - with positive Bcx here and isolation of MSSA, stopped dapto and placed on cefazolin  to have targeted MSSA therapy 1/21.   - persistence  of bacteremia concerning for source control issue.  If further orthopedic intervention is performed, would appreciate cultures to be obtained at that time.  -With ongoing bacteremia and presence of PICC line, PICC line removed 1/21 and culture tip which remains neg to date.    - repeat sets of blood cultures remains negative to date from 1/21. Plan to hold off on new central line placement until repeat sets of blood cultures remain negative.    - TTE without any evidence of IE.  -Final antibiotic recs pending findings in OR and OR cultures.     DM2:  - Good glycemic control to aid in healing and prevention of further infections.  - A1c on last admission was 13.9, consider recheck  - Patient previously lost her insurance and was not on any diabetic medications, this has  been resumed since the last admission.      Medical Decision Making:  The following items were considered in medical decision making:  Discussion of patient care with other providers  Reviewed clinical lab tests  Reviewed radiology tests  Reviewed other diagnostic tests/interventions  Independent review of radiologic images  Microbiology cultures and other micro tests reviewed      Please note that this chart was generated using Dragon dictation software. Although every effort was made to ensure the accuracy of this automated transcription, some errors in transcription may have occurred inadvertently.  Any pictures or media included in this note were obtained after taking informed verbal consent from the patient and with their approval to include those in the patient's medical record.     Discussed with patient, ortho, Dr. Melodie, social work and primary team, Dr. Evern.  Katherinne Mofield, MD

## 2024-05-08 LAB — BASIC METABOLIC PANEL
Anion Gap: 12 (ref 3–16)
BUN: 11 mg/dL (ref 7–20)
CO2: 24 mmol/L (ref 21–32)
Calcium: 9.5 mg/dL (ref 8.3–10.6)
Chloride: 100 mmol/L (ref 99–110)
Creatinine: 0.7 mg/dL (ref 0.6–1.1)
Est, Glom Filt Rate: >90
Glucose: 320 mg/dL — ABNORMAL HIGH (ref 70–99)
Potassium: 4.4 mmol/L (ref 3.5–5.1)
Sodium: 136 mmol/L (ref 136–145)

## 2024-05-08 LAB — CBC WITH AUTO DIFFERENTIAL
Basophils %: 0.2 %
Basophils Absolute: 0 10*3/uL (ref 0.0–0.2)
Eosinophils %: 0 %
Eosinophils Absolute: 0 10*3/uL (ref 0.0–0.6)
Hematocrit: 31.6 % — ABNORMAL LOW (ref 36.0–48.0)
Hemoglobin: 10.4 g/dL — ABNORMAL LOW (ref 12.0–16.0)
Lymphocytes %: 12.1 %
Lymphocytes Absolute: 1.5 10*3/uL (ref 1.0–5.1)
MCH: 26.7 pg (ref 26.0–34.0)
MCHC: 33 g/dL (ref 31.0–36.0)
MCV: 80.9 fL (ref 80.0–100.0)
MPV: 7.9 fL (ref 5.0–10.5)
Monocytes %: 3.5 %
Monocytes Absolute: 0.4 10*3/uL (ref 0.0–1.3)
Neutrophils %: 84.2 %
Neutrophils Absolute: 10.2 10*3/uL — ABNORMAL HIGH (ref 1.7–7.7)
Platelets: 451 10*3/uL — ABNORMAL HIGH (ref 135–450)
RBC: 3.9 M/uL — ABNORMAL LOW (ref 4.00–5.20)
RDW: 15.8 % — ABNORMAL HIGH (ref 12.4–15.4)
WBC: 12.1 10*3/uL — ABNORMAL HIGH (ref 4.0–11.0)

## 2024-05-08 LAB — CULTURE, BLOOD 1: Blood Culture, Routine: NO GROWTH

## 2024-05-08 LAB — POCT GLUCOSE
POC Glucose: 208 mg/dL — ABNORMAL HIGH (ref 70–99)
POC Glucose: 234 mg/dL — ABNORMAL HIGH (ref 70–99)
POC Glucose: 280 mg/dL — ABNORMAL HIGH (ref 70–99)
POC Glucose: 286 mg/dL — ABNORMAL HIGH (ref 70–99)
POC Glucose: 364 mg/dL — ABNORMAL HIGH (ref 70–99)

## 2024-05-08 LAB — CULTURE, URINE: Urine Culture, Routine: NO GROWTH

## 2024-05-08 MED ORDER — INSULIN GLARGINE 100 UNIT/ML SC SOLN
100 | Freq: Every evening | SUBCUTANEOUS | Status: DC
Start: 2024-05-08 — End: 2024-05-11
  Administered 2024-05-09 – 2024-05-11 (×3): 45 [IU] via SUBCUTANEOUS

## 2024-05-08 MED ORDER — MORPHINE SULFATE-NACL 2-0.9 MG/2ML-% IJ SOSY
2-0.9 | Freq: Once | INTRAMUSCULAR | Status: AC | PRN
Start: 2024-05-08 — End: 2024-05-07
  Administered 2024-05-08: 05:00:00 2 mg via INTRAVENOUS

## 2024-05-08 MED FILL — INSULIN LISPRO 100 UNIT/ML IJ SOLN: 100 [IU]/mL | INTRAMUSCULAR | Qty: 4 | Fill #0

## 2024-05-08 MED FILL — MORPHINE SULFATE-NACL 2-0.9 MG/2ML-% IJ SOSY: 2-0.9 MG/2ML-% | INTRAMUSCULAR | Qty: 2 | Fill #0

## 2024-05-08 MED FILL — ENOXAPARIN SODIUM 40 MG/0.4ML IJ SOSY: 40 MG/0.4ML | INTRAMUSCULAR | Qty: 0.4 | Fill #0

## 2024-05-08 MED FILL — CEFAZOLIN SODIUM 1 G IJ SOLR: 1 g | INTRAMUSCULAR | Qty: 2000 | Fill #0

## 2024-05-08 MED FILL — INSULIN LISPRO 100 UNIT/ML IJ SOLN: 100 [IU]/mL | INTRAMUSCULAR | Qty: 5 | Fill #0

## 2024-05-08 MED FILL — OXYCODONE HCL 5 MG PO TABS: 5 mg | ORAL | Qty: 1 | Fill #0

## 2024-05-08 MED FILL — INSULIN LISPRO 100 UNIT/ML IJ SOLN: 100 [IU]/mL | INTRAMUSCULAR | Qty: 8 | Fill #0

## 2024-05-08 MED FILL — ATORVASTATIN CALCIUM 20 MG PO TABS: 20 mg | ORAL | Qty: 1 | Fill #0

## 2024-05-08 MED FILL — INSULIN LISPRO 100 UNIT/ML IJ SOLN: 100 [IU]/mL | INTRAMUSCULAR | Qty: 2 | Fill #0

## 2024-05-08 MED FILL — LOSARTAN POTASSIUM 25 MG PO TABS: 25 mg | ORAL | Qty: 1 | Fill #0

## 2024-05-08 MED FILL — FERROUS SULFATE 325 (65 FE) MG PO TABS: 325 (65 Fe) MG | ORAL | Qty: 1 | Fill #0

## 2024-05-08 MED FILL — LANTUS 100 UNIT/ML SC SOLN: 100 [IU]/mL | SUBCUTANEOUS | Qty: 40 | Fill #0

## 2024-05-08 MED FILL — LEVOTHYROXINE SODIUM 25 MCG PO TABS: 25 ug | ORAL | Qty: 1 | Fill #0

## 2024-05-08 NOTE — Progress Notes (Signed)
 ID Follow-up NOTE    CC:   Fever, tachycardia   Antibiotics: cefazolin     Admit Date: 05/03/2024  Hospital Day: 6    Subjective:     Patient has been afebrile, denies any worsening hip pain.  No fevers or chills.      Objective:     Patient Vitals for the past 8 hrs:   BP Temp Temp src Pulse Resp SpO2   05/08/24 1233 -- -- -- -- 17 --   05/08/24 1201 105/83 98.2 F (36.8 C) Oral (!) 115 17 99 %   05/08/24 0807 119/72 98.1 F (36.7 C) Oral (!) 106 17 98 %     I/O last 3 completed shifts:  In: 1000 [I.V.:1000]  Out: 1025 [Urine:1000; Blood:25]  I/O this shift:  In: -   Out: 800 [Urine:800]    EXAM:  GENERAL: No apparent distress.    HEENT: Membranes moist, no oral lesion  NECK:  Supple, no lymphadenopathy  LUNGS: Clear b/l, no rales, no dullness  CARDIAC: RRR  ABD:  + BS, soft / NT  EXT:   Left hip dressing in place, denies any worsening pain   NEURO: No focal neurologic findings  PSYCH: Orientation, sensorium, mood normal  LINES:  Peripheral iv, PICC line has been removed.  No induration at this previous site.       Data Review:  Lab Results   Component Value Date    WBC 12.1 (H) 05/08/2024    HGB 10.4 (L) 05/08/2024    HCT 31.6 (L) 05/08/2024    MCV 80.9 05/08/2024    PLT 451 (H) 05/08/2024     Lab Results   Component Value Date    CREATININE 0.7 05/08/2024    BUN 11 05/08/2024    NA 136 05/08/2024    K 4.4 05/08/2024    CL 100 05/08/2024    CO2 24 05/08/2024       Hepatic Function Panel:   Lab Results   Component Value Date/Time    ALKPHOS 152 05/04/2024 05:07 AM    ALT <5 05/04/2024 05:07 AM    AST 10 05/04/2024 05:07 AM    BILITOT 0.6 05/04/2024 05:07 AM    BILIDIR 0.2 05/03/2024 02:17 PM    IBILI 0.4 05/03/2024 02:17 PM       MICRO:  Or culture left hip bursa 1/23: No growth to date, no organisms on Gram stain    Blood cultures x 2 1/21: No growth to date    Blood cultures x 2 1/19: 1 set positive for MSSA  Staphylococcus aureus (2)    Antibiotic Interpretation MIC  Method Status    clindamycin  Sensitive <=0.25  mcg/mL BACTERIAL SUSCEPTIBILITY PANEL BY MIC     erythromycin  Sensitive <=0.25 mcg/mL BACTERIAL SUSCEPTIBILITY PANEL BY MIC     levofloxacin Resistant 4 mcg/mL BACTERIAL SUSCEPTIBILITY PANEL BY MIC     linezolid Sensitive 2 mcg/mL BACTERIAL SUSCEPTIBILITY PANEL BY MIC     oxacillin Sensitive 0.5 mcg/mL BACTERIAL SUSCEPTIBILITY PANEL BY MIC     tetracycline Sensitive <=1 mcg/mL BACTERIAL SUSCEPTIBILITY PANEL BY MIC     trimethoprim-sulfamethoxazole Sensitive <=10 mcg/mL BACTERIAL SUSCEPTIBILITY PANEL BY MIC     vancomycin  Sensitive 1 mcg/mL BACTERIAL SUSCEPTIBILITY PANEL BY MIC          Blood cultures x 2 12/21: Neg      OR culture left hip bursa 12/19: Rare growth Staph aureus, MSSA     Blood cultures x 2 12/16: No growth  Left hip bursa aspirate culture 12/18: Moderate growth MSSA     Blood cultures x 2 12/15: Positive for MSSA     Blood cultures x 2 12/13: Positive for MSSA  Staphylococcus aureus (2)     Antibiotic Interpretation MIC   Method Status     clindamycin  Sensitive <=0.25 mcg/mL BACTERIAL SUSCEPTIBILITY PANEL BY MIC       erythromycin  Sensitive <=0.25 mcg/mL BACTERIAL SUSCEPTIBILITY PANEL BY MIC       levofloxacin Resistant 4 mcg/mL BACTERIAL SUSCEPTIBILITY PANEL BY MIC       linezolid Sensitive 2 mcg/mL BACTERIAL SUSCEPTIBILITY PANEL BY MIC       oxacillin Sensitive 0.5 mcg/mL BACTERIAL SUSCEPTIBILITY PANEL BY MIC       tetracycline Sensitive <=1 mcg/mL BACTERIAL SUSCEPTIBILITY PANEL BY MIC         Tetracyclines have decreased renal elimination and  urinary bladder concentrations.  Use of this class of  agents for urinary tract infection should be limited to  patients that cannot tolerate an alternative therapy.           trimethoprim-sulfamethoxazole Sensitive <=10 mcg/mL BACTERIAL SUSCEPTIBILITY PANEL BY MIC       vancomycin  Sensitive 1 mcg/mL BACTERIAL SUSCEPTIBILITY PANEL BY MIC          Urine culture 12/14: 25,000 CFU MSSA      IMAGING: I have independently reviewed the images and reports.        MRI left hip with and without contrast 1/20:  1. Persistent fasciitis, bursitis, myositis and abscess formation along the gluteus medius, the lateral upper thigh musculature fascia, and along the greater trochanteric bursa. The abscess collection has decreased in size. The extent of inflammation   appears to be mildly improved in comparison to the prior exam.     Portable chest x-ray 1/19:  No acute cardiopulmonary abnormality     X-ray left hip and pelvis 1/19:  No acute osseous abnormality.     MRI left hip with and without contrast 12/17:  1. There is a 4.6 cm left greater trochanter bursal fluid collection with thin tract which extends superiorly into the left gluteus medius muscle. There is extensive surrounding edema as well as edema within the left gluteus medius muscle, and lateral   aspect of the adjacent left gluteus maximus muscle. In the setting of infection this is most worrisome for a septic bursitis-abscess.  2. No evidence of any osteomyelitis.  3. No acute osseous abnormality.        Scheduled Meds:   insulin  glargine  45 Units SubCUTAneous Nightly    insulin  lispro  5 Units SubCUTAneous TID WC    ceFAZolin   2,000 mg IntraVENous q8h    ferrous sulfate   325 mg Oral Daily with breakfast    levothyroxine   25 mcg Oral Daily    losartan   25 mg Oral Daily    atorvastatin   20 mg Oral Nightly    sodium chloride  flush  5-40 mL IntraVENous 2 times per day    enoxaparin   40 mg SubCUTAneous Daily    sodium chloride  flush  5-40 mL IntraVENous 2 times per day    insulin  lispro  0-8 Units SubCUTAneous 4x Daily AC & HS       Continuous Infusions:   sodium chloride       sodium chloride       dextrose          PRN Meds:  sulfur  hexafluoride microspheres, oxyCODONE , sodium chloride  flush, sodium chloride , magnesium  sulfate, ondansetron  **OR**  ondansetron , polyethylene glycol, acetaminophen  **OR** acetaminophen , sodium chloride  flush, sodium chloride , glucose, dextrose  bolus **OR** dextrose  bolus, glucagon ,  dextrose       Assessment:       Patient Active Problem List   Diagnosis    Mixed hyperlipidemia    Iron  deficiency anemia    Type 2 diabetes mellitus with hyperglycemia, with long-term current use of insulin  (HCC)    Essential hypertension    Acquired hypothyroidism    Mild intermittent asthma without complication    Allergic rhinitis    Heart murmur    Uterine fibroid    Class 1 obesity due to excess calories with serious comorbidity and body mass index (BMI) of 31.0 to 31.9 in adult    Abnormal uterine bleeding    Eczema    Glaucoma suspect, bilateral    Hidradenitis axillaris    HSV-2 infection    Myopic astigmatism, bilateral    Strabismus    S/P myomectomy    DKA, type 2, not at goal [facility])    Acute nonintractable headache    MSSA bacteremia    Diabetic ketoacidosis without coma associated with type 2 diabetes mellitus (HCC)    Greater trochanteric bursitis of left hip    Abscess of bursa of left hip    Sepsis (HCC)    Type 2 diabetes mellitus with other specified complication [facility])        Plan:   39 year old African-American female with history of DM2, HTN, HLD that presented on 03/27/24 with headache and shortness of breath. She was found to be in DKA and was admitted to the ICU.  During that admission she was also found to have MSSA bacteremia and left hip greater trochanteric bursitis. She is on IV abx course with daptomycin  and here with fevers and tachycardia     MSSA bacteremia and left hip greater trochanteric bursitis:  - hx of recent admission here on 03/2024 in the ICU with DKA, was also found to have MSSA bacteremia with left hip trochanteric greater bursitis  - previous MRI hip on  03/31/2024 showed 4.6cm left greater trochanteric bursal fluid collection.  This was aspirated with IR and yielded growth of MSSA.    - orthopedics performed operative washout of the bursa on 04/02/24 with operative cultures showing growth of the same.  -Patient has been on a course of IV daptomycin  now doing this at  home.  We initially had to choose a once a day antibiotic as patient was receiving charity care via [facility] and therefore was DC'd on daptomycin . This was initially planned to end on 1/16 however due to persistently elevated inflammatory markers, was extended to 1/30, and had planned OP repeat MRI hip.   -Now admitted on 1/19 with fever and tachycardia  - WBC was 12.3 and Tmax of 101.8.  ESR/CRP was 94/126 respectively.  She has been resumed on daptomycin   - Blood cultures x 2 taken on admission from 1/19 now with one set positive for MSSA.    -Patient denies any new complaints besides left hip tenderness, states slight pain on bearing weight and sometimes gives out.    - MRI left hip on this admission showing persistent fasciitis, bursitis, myositis and abscess formation along the gluteus medius, the lateral upper thigh musculature fascia, and along the greater trochanteric bursa. The abscess collection has decreased in size.   - ortho eval, Dr.Erlichmann performed washout 1/23 and pending cx.   - intra-op findings of scant purulence in the trochanteric bursa superiorly going  into the glute medius. This was bluntly opened up with a Cobb elevator throughout the glue medius ensuring all areas were opened and drained. Purulence was sent for culture.   - with positive Bcx here and isolation of MSSA, stopped dapto and placed on cefazolin  to have targeted MSSA therapy 1/21.   - persistence of bacteremia concerning for source control issue.    -With ongoing bacteremia and presence of PICC line, PICC line removed 1/21 and culture tip which remains neg to date.    - repeat sets of blood cultures remains negative to date from 1/21. Plan to hold off on new central line placement until repeat sets of blood cultures remain negative.    - TTE without any evidence of IE.  - continue cefazolin , plan picc placement on 1/26 if blood cultures remain negative will follow intraoperative cultures from the OR washout for any further  growth.     DM2:  - Good glycemic control to aid in healing and prevention of further infections.  - A1c on last admission was 13.9, consider recheck  - Patient previously lost her insurance and was not on any diabetic medications, this has been resumed since the last admission.      Medical Decision Making:  The following items were considered in medical decision making:  Discussion of patient care with other providers  Reviewed clinical lab tests  Reviewed radiology tests  Reviewed other diagnostic tests/interventions  Independent review of radiologic images  Microbiology cultures and other micro tests reviewed      Please note that this chart was generated using Dragon dictation software. Although every effort was made to ensure the accuracy of this automated transcription, some errors in transcription may have occurred inadvertently.  Any pictures or media included in this note were obtained after taking informed verbal consent from the patient and with their approval to include those in the patient's medical record.     Discussed with patient, ortho, Dr. Melodie, social work and primary team, Dr. Hildegard Rhea Peniel Hass, MD

## 2024-05-08 NOTE — Progress Notes (Addendum)
 V2.0    [facility] Progress Note      Name:  Shelly Richard DOB/Age/Sex: 1986-02-09  (39 y.o. female)   MRN & CSN:  [id number] & [id number] Encounter Date/Time: 05/08/2024 9:58 AM EST   Location:  6305/6305-01 PCP: Alexandro Bernice BRAVO, [name]     Attending:Kingslee Mairena, Edyth RAMAN, MD       Hospital Day: 6    Assessment and Recommendations     39 year old female with a history of uncontrolled diabetes mellitus with last A1c of 13.9, recent hospitalization for DKA which was complicated by left hip bursitis requiring I&D and long-term IV antibiotics presenting from home with tachycardia, fever spikes.  Concern for recurrence of left hip bursitis.  Currently with MRI of the left hip showing persistent fasciitis, bursitis, abscess formation along the gluteus medius, lateral upper thigh musculature fossa, greater trochanteric bursa.  Blood cultures are growing MSSA.I and D of hip done yesterday    Fever  Possible sepsis secondary to left hip bursitis  MSSA bacteremia  Post hip I&D await culture results continue current antibiotics infectious disease consult and orthopedics consult noted  - Follow-up Ortho consult.  -Continue IV Ancef .  - ID consult.  Discussed with ID.   -Continue to follow repeat blood cultures  - TTE without significant findings.   -    Hypothyroidism  - Continue Synthroid .    Uncontrolled diabetes mellitus 13.9% last A1c  - -Poorly controlled today blood sugars are in the 300s will increase Lantus  to 45 units.    Hypertension  - Continue losartan     Microcytic anemia  - Continue iron  supplementation.    Vaginal discharge  -Send for UA.     Diet ADULT DIET; Regular   DVT Prophylaxis [x]  Lovenox , []   Heparin, []  SCDs, []  Ambulation,  []  Eliquis, []  Xarelto  []  Coumadin   Code Status Full Code   Disposition From: Home  Expected Disposition: tbd  Estimated Date of Discharge: tbd  Patient requires continued admission due to Sepsis   Surrogate Decision Maker/ POA  [name] listed as primary contact.       Personally reviewed Lab Studies and Imaging     Discussed management of the case with case management.  I reviewed IDs progress note.        Subjective:     Patient seen and evaluated sitting in chair.       Review of Systems:      Pertinent positives and negatives discussed in HPI    Objective:     Intake/Output Summary (Last 24 hours) at 05/08/2024 1428  Last data filed at 05/08/2024 0709  Gross per 24 hour   Intake 1000 ml   Output 1825 ml   Net -825 ml        Vitals:   Vitals:    05/08/24 0352 05/08/24 0807 05/08/24 1201 05/08/24 1233   BP: 127/72 119/72 105/83    Pulse: (!) 110 (!) 106 (!) 115    Resp: 16 17 17 17    Temp: 98.2 F (36.8 C) 98.1 F (36.7 C) 98.2 F (36.8 C)    TempSrc: Oral Oral Oral    SpO2: 97% 98% 99%    Weight:       Height:             Physical Exam:      General: NAD  Eyes: EOMI  ENT: neck supple  Cardiovascular: Tachycardic.   Respiratory: Bilateral symmetric expansion  Gastrointestinal: Active bowel sounds.  Genitourinary: no suprapubic tenderness  Musculoskeletal: No edema  Skin: warm, dry  Neuro: Oriented x3.  Psych: Mood appropriate.         Medications:   Medications:    insulin  lispro  5 Units SubCUTAneous TID WC    ceFAZolin   2,000 mg IntraVENous q8h    ferrous sulfate   325 mg Oral Daily with breakfast    insulin  glargine  40 Units SubCUTAneous Nightly    levothyroxine   25 mcg Oral Daily    losartan   25 mg Oral Daily    atorvastatin   20 mg Oral Nightly    sodium chloride  flush  5-40 mL IntraVENous 2 times per day    enoxaparin   40 mg SubCUTAneous Daily    sodium chloride  flush  5-40 mL IntraVENous 2 times per day    insulin  lispro  0-8 Units SubCUTAneous 4x Daily AC & HS      Infusions:    sodium chloride       sodium chloride       dextrose        PRN Meds: sulfur  hexafluoride microspheres, 2 mL, ONCE PRN  oxyCODONE , 5 mg, Q6H PRN  sodium chloride  flush, 5-40 mL, PRN  sodium chloride , , PRN  magnesium  sulfate, 2,000 mg, PRN  ondansetron , 4 mg, Q8H PRN   Or  ondansetron , 4 mg,  Q6H PRN  polyethylene glycol, 17 g, Daily PRN  acetaminophen , 650 mg, Q6H PRN   Or  acetaminophen , 650 mg, Q6H PRN  sodium chloride  flush, 5-40 mL, PRN  sodium chloride , , PRN  glucose, 4 tablet, PRN  dextrose  bolus, 125 mL, PRN   Or  dextrose  bolus, 250 mL, PRN  glucagon , 1 mg, PRN  dextrose , , Continuous PRN        Labs and Imaging   XR HIP 2-3 VW W PELVIS LEFT  Result Date: 05/03/2024  XR HIP 2-3 VW W PELVIS LEFT Indication: Left hip pain. History trochanteric bursitis. COMPARISON: 03/31/2024 Findings: Frontal view the pelvis and frog-leg lateral view of the left hip were obtained. The bones are well-mineralized. There is no acute fracture or dislocation. The regional soft tissues are unremarkable.     Impression: No acute osseous abnormality. Electronically signed by Belvie Plana, MD    XR CHEST PORTABLE  Result Date: 05/03/2024  XR CHEST PORTABLE Indication: Cough COMPARISON: 03/27/2024 Findings: Portable AP upright view of the chest was obtained. The heart is normal in size and configuration. The lungs are clear. There is no effusion or pneumothorax.     Impression: No acute cardiopulmonary abnormality. Electronically signed by Belvie Plana, MD      CBC:   Recent Labs     05/06/24  0740 05/07/24  0516 05/08/24  0602   WBC 11.4* 9.4 12.1*   HGB 9.9* 10.4* 10.4*   PLT 372 399 451*     BMP:    Recent Labs     05/06/24  0740 05/07/24  0516 05/08/24  0602   NA 138 138 136   K 3.3* 3.7 4.4   CL 101 102 100   CO2 25 25 24    BUN 11 13 11    CREATININE 0.7 0.7 0.7   GLUCOSE 190* 139* 320*     Hepatic:   No results for input(s): AST, ALT, BILITOT, ALKPHOS in the last 72 hours.    Invalid input(s): ALB    Lipids:   Lab Results   Component Value Date/Time    CHOL 154 11/25/2023 02:44 PM    HDL  22 11/25/2023 02:44 PM    TRIG 170 11/25/2023 02:44 PM     Hemoglobin A1C:   Lab Results   Component Value Date/Time    LABA1C 8.5 05/04/2024 10:40 AM     TSH:   Lab Results   Component Value Date/Time    TSH 1.92  08/27/2022 04:35 PM     Troponin: No results found for: TROPONINT  Lactic Acid: No results for input(s): LACTA in the last 72 hours.  BNP: No results for input(s): PROBNP in the last 72 hours.  UA:  Lab Results   Component Value Date/Time    NITRU Negative 05/07/2024 04:04 PM    COLORU Yellow 05/07/2024 04:04 PM    PHUR 6.0 05/07/2024 04:04 PM    PHUR 5.5 02/25/2017 12:22 PM    WBCUA 51-100 05/07/2024 04:04 PM    RBCUA 11-20 05/07/2024 04:04 PM    TRICHOMONAS Present 05/03/2024 04:37 PM    BACTERIA 1+ 05/07/2024 04:04 PM    CLARITYU Clear 05/07/2024 04:04 PM    LEUKOCYTESUR MODERATE 05/07/2024 04:04 PM    UROBILINOGEN 0.2 05/07/2024 04:04 PM    BILIRUBINUR Negative 05/07/2024 04:04 PM    BLOODU SMALL 05/07/2024 04:04 PM    GLUCOSEU Negative 05/07/2024 04:04 PM    KETUA Negative 05/07/2024 04:04 PM     Urine Cultures:   Lab Results   Component Value Date/Time    LABURIN No growth at 18 to 36 hours 05/07/2024 04:04 PM     Blood Cultures:   Lab Results   Component Value Date/Time    [facility]  05/05/2024 02:20 PM     No Growth to date.  Any change in status will be called.     Lab Results   Component Value Date/Time    BLOODCULT2  05/05/2024 02:20 PM     No Growth to date.  Any change in status will be called.     Organism:   Lab Results   Component Value Date/Time    ORG Staph aureus DNA Detected 05/03/2024 03:10 PM    ORG Staphylococcus aureus 05/03/2024 03:10 PM         Electronically signed by Edyth GORMAN Lash, MD on 05/08/2024 at 2:28 PM

## 2024-05-08 NOTE — Progress Notes (Signed)
 Department of Orthopedic Surgery  Progress Note      SUBJECTIVE  doing well, minimal pain, similar to pre op, walking well, no fevers chills    OBJECTIVE    Physical  - L hip dressing c/d/I. Minimal edema no erythema. NVID      VITALS:  BP 105/83   Pulse (!) 115   Temp 98.2 F (36.8 C) (Oral)   Resp 17   Ht 1.626 m (5' 4)   Wt 75.3 kg (166 lb)   SpO2 99%   BMI 28.49 kg/m   24HR INTAKE/OUTPUT:      Intake/Output Summary (Last 24 hours) at 05/08/2024 1227  Last data filed at 05/08/2024 0709  Gross per 24 hour   Intake 1000 ml   Output 1825 ml   Net -825 ml     URINARY CATHETER OUTPUT (Foley):         Data    CBC:   Lab Results   Component Value Date/Time    WBC 12.1 05/08/2024 06:02 AM    RBC 3.90 05/08/2024 06:02 AM    HGB 10.4 05/08/2024 06:02 AM    HCT 31.6 05/08/2024 06:02 AM    MCV 80.9 05/08/2024 06:02 AM    MCH 26.7 05/08/2024 06:02 AM    MCHC 33.0 05/08/2024 06:02 AM    RDW 15.8 05/08/2024 06:02 AM    PLT 451 05/08/2024 06:02 AM    MPV 7.9 05/08/2024 06:02 AM     BMP:    Lab Results   Component Value Date/Time    NA 136 05/08/2024 06:02 AM    K 4.4 05/08/2024 06:02 AM    K 2.9 05/04/2024 05:07 AM    CL 100 05/08/2024 06:02 AM    CO2 24 05/08/2024 06:02 AM    BUN 11 05/08/2024 06:02 AM    CREATININE 0.7 05/08/2024 06:02 AM    CALCIUM  9.5 05/08/2024 06:02 AM    GFRAA >60 08/11/2019 12:18 PM    LABGLOM >90 05/08/2024 06:02 AM    LABGLOM >60 02/02/2022 05:10 AM    GLUCOSE 320 05/08/2024 06:02 AM     Current Inpatient Medications    Current Facility-Administered Medications: sulfur  hexafluoride microspheres (LUMASON ) 60.7-25 MG injection 2 mL, 2 mL, IntraVENous, ONCE PRN  oxyCODONE  (ROXICODONE ) immediate release tablet 5 mg, 5 mg, Oral, Q6H PRN  insulin  lispro (HUMALOG ,ADMELOG ) injection vial 5 Units, 5 Units, SubCUTAneous, TID WC  ceFAZolin  (ANCEF ) 2,000 mg in sterile water  20 mL IV syringe, 2,000 mg, IntraVENous, q8h  ferrous sulfate  (IRON  325) tablet 325 mg, 325 mg, Oral, Daily with breakfast  insulin   glargine (LANTUS ) injection vial 40 Units, 40 Units, SubCUTAneous, Nightly  levothyroxine  (SYNTHROID ) tablet 25 mcg, 25 mcg, Oral, Daily  losartan  (COZAAR ) tablet 25 mg, 25 mg, Oral, Daily  atorvastatin  (LIPITOR ) tablet 20 mg, 20 mg, Oral, Nightly  sodium chloride  flush 0.9 % injection 5-40 mL, 5-40 mL, IntraVENous, 2 times per day  sodium chloride  flush 0.9 % injection 5-40 mL, 5-40 mL, IntraVENous, PRN  0.9 % sodium chloride  infusion, , IntraVENous, PRN  magnesium  sulfate 2000 mg in 50 mL IVPB premix, 2,000 mg, IntraVENous, PRN  enoxaparin  (LOVENOX ) injection 40 mg, 40 mg, SubCUTAneous, Daily  ondansetron  (ZOFRAN -ODT) disintegrating tablet 4 mg, 4 mg, Oral, Q8H PRN **OR** ondansetron  (ZOFRAN ) injection 4 mg, 4 mg, IntraVENous, Q6H PRN  polyethylene glycol (GLYCOLAX ) packet 17 g, 17 g, Oral, Daily PRN  acetaminophen  (TYLENOL ) tablet 650 mg, 650 mg, Oral, Q6H PRN **OR** acetaminophen  (TYLENOL ) suppository 650 mg,  650 mg, Rectal, Q6H PRN  sodium chloride  flush 0.9 % injection 5-40 mL, 5-40 mL, IntraVENous, 2 times per day  sodium chloride  flush 0.9 % injection 5-40 mL, 5-40 mL, IntraVENous, PRN  0.9 % sodium chloride  infusion, , IntraVENous, PRN  glucose chewable tablet 16 g, 4 tablet, Oral, PRN  dextrose  bolus 10% 125 mL, 125 mL, IntraVENous, PRN **OR** dextrose  bolus 10% 250 mL, 250 mL, IntraVENous, PRN  glucagon  injection 1 mg, 1 mg, SubCUTAneous, PRN  dextrose  10 % infusion, , IntraVENous, Continuous PRN  insulin  lispro (HUMALOG ,ADMELOG ) injection vial 0-8 Units, 0-8 Units, SubCUTAneous, 4x Daily AC & HS    ASSESSMENT AND PLAN      POD 1 repeat L hip troch bursa I&D for septic bursitis involving glut medius     -Continue current mgmt  -Activity as tolerated   -Glucose control  -monitor cultures, appreciate ID recs for home going abx  -will plan for dressing change / pull packing Monday/Tuesday    Lamar Carrier, MD

## 2024-05-08 NOTE — Plan of Care (Signed)
 Problem: Chronic Conditions and Co-morbidities  Goal: Patient's chronic conditions and co-morbidity symptoms are monitored and maintained or improved  Outcome: Progressing     Problem: Discharge Planning  Goal: Discharge to home or other facility with appropriate resources  Outcome: Progressing     Problem: Pain  Goal: Verbalizes/displays adequate comfort level or baseline comfort level  Outcome: Progressing     Problem: Safety - Adult  Goal: Free from fall injury. Pt educated on safety precautions. Call light within reach  Outcome: Progressing

## 2024-05-09 LAB — BASIC METABOLIC PANEL
Anion Gap: 11 (ref 3–16)
BUN: 14 mg/dL (ref 7–20)
CO2: 28 mmol/L (ref 21–32)
Calcium: 9.4 mg/dL (ref 8.3–10.6)
Chloride: 102 mmol/L (ref 99–110)
Creatinine: 0.8 mg/dL (ref 0.6–1.1)
Est, Glom Filt Rate: >90
Glucose: 89 mg/dL (ref 70–99)
Potassium: 3.5 mmol/L (ref 3.5–5.1)
Sodium: 141 mmol/L (ref 136–145)

## 2024-05-09 LAB — CBC WITH AUTO DIFFERENTIAL
Basophils %: 0.4 %
Basophils Absolute: 0 10*3/uL (ref 0.0–0.2)
Eosinophils %: 0.8 %
Eosinophils Absolute: 0.1 10*3/uL (ref 0.0–0.6)
Hematocrit: 29.8 % — ABNORMAL LOW (ref 36.0–48.0)
Hemoglobin: 9.9 g/dL — ABNORMAL LOW (ref 12.0–16.0)
Lymphocytes %: 25 %
Lymphocytes Absolute: 3 10*3/uL (ref 1.0–5.1)
MCH: 26.9 pg (ref 26.0–34.0)
MCHC: 33.1 g/dL (ref 31.0–36.0)
MCV: 81.4 fL (ref 80.0–100.0)
MPV: 7.8 fL (ref 5.0–10.5)
Monocytes %: 5.6 %
Monocytes Absolute: 0.7 10*3/uL (ref 0.0–1.3)
Neutrophils %: 68.2 %
Neutrophils Absolute: 8.1 10*3/uL — ABNORMAL HIGH (ref 1.7–7.7)
Platelets: 425 10*3/uL (ref 135–450)
RBC: 3.66 M/uL — ABNORMAL LOW (ref 4.00–5.20)
RDW: 16.2 % — ABNORMAL HIGH (ref 12.4–15.4)
WBC: 11.9 10*3/uL — ABNORMAL HIGH (ref 4.0–11.0)

## 2024-05-09 LAB — SEDIMENTATION RATE: Sed Rate, Automated: 73 mm/h — ABNORMAL HIGH (ref 0–20)

## 2024-05-09 LAB — POCT GLUCOSE
POC Glucose: 187 mg/dL — ABNORMAL HIGH (ref 70–99)
POC Glucose: 92 mg/dL (ref 70–99)
POC Glucose: 132 mg/dL — ABNORMAL HIGH (ref 70–99)
POC Glucose: 149 mg/dL — ABNORMAL HIGH (ref 70–99)

## 2024-05-09 LAB — C-REACTIVE PROTEIN: CRP: 43.9 mg/L — ABNORMAL HIGH (ref 0.0–5.1)

## 2024-05-09 LAB — CULTURE, TIP: Culture Catheter Tip: NO GROWTH

## 2024-05-09 MED ORDER — LIDOCAINE HCL (PF) 1 % IJ SOLN
1 | Freq: Once | INTRAMUSCULAR | Status: AC
Start: 2024-05-09 — End: 2024-05-10
  Administered 2024-05-10: 16:00:00 50 mg via INTRADERMAL

## 2024-05-09 MED ORDER — NORMAL SALINE FLUSH 0.9 % IV SOLN
0.9 | Freq: Two times a day (BID) | INTRAVENOUS | Status: DC
Start: 2024-05-09 — End: 2024-05-11
  Administered 2024-05-10: 04:00:00 5 mL via INTRAVENOUS
  Administered 2024-05-10 – 2024-05-11 (×3): 10 mL via INTRAVENOUS

## 2024-05-09 MED ORDER — SODIUM CHLORIDE 0.9 % IV SOLN
0.9 | INTRAVENOUS | Status: DC | PRN
Start: 2024-05-09 — End: 2024-05-11

## 2024-05-09 MED ORDER — NORMAL SALINE FLUSH 0.9 % IV SOLN
0.9 | INTRAVENOUS | Status: DC | PRN
Start: 2024-05-09 — End: 2024-05-11

## 2024-05-09 MED FILL — FERROUS SULFATE 325 (65 FE) MG PO TABS: 325 (65 Fe) MG | ORAL | Qty: 1

## 2024-05-09 MED FILL — INSULIN LISPRO 100 UNIT/ML IJ SOLN: 100 [IU]/mL | INTRAMUSCULAR | Qty: 5

## 2024-05-09 MED FILL — INSULIN LISPRO 100 UNIT/ML IJ SOLN: 100 [IU]/mL | INTRAMUSCULAR | Qty: 2 | Fill #0

## 2024-05-09 MED FILL — CEFAZOLIN SODIUM 1 G IJ SOLR: 1 g | INTRAMUSCULAR | Qty: 2000 | Fill #0

## 2024-05-09 MED FILL — LOSARTAN POTASSIUM 25 MG PO TABS: 25 mg | ORAL | Qty: 1

## 2024-05-09 MED FILL — ENOXAPARIN SODIUM 40 MG/0.4ML IJ SOSY: 40 MG/0.4ML | INTRAMUSCULAR | Qty: 0.4

## 2024-05-09 MED FILL — LEVOTHYROXINE SODIUM 25 MCG PO TABS: 25 ug | ORAL | Qty: 1

## 2024-05-09 MED FILL — CEFAZOLIN SODIUM 1 G IJ SOLR: 1 g | INTRAMUSCULAR | Qty: 2000

## 2024-05-09 MED FILL — ATORVASTATIN CALCIUM 20 MG PO TABS: 20 mg | ORAL | Qty: 1 | Fill #0

## 2024-05-09 MED FILL — OXYCODONE HCL 5 MG PO TABS: 5 mg | ORAL | Qty: 1

## 2024-05-09 MED FILL — LANTUS 100 UNIT/ML SC SOLN: 100 [IU]/mL | SUBCUTANEOUS | Qty: 45 | Fill #0

## 2024-05-09 NOTE — Plan of Care (Signed)
 Problem: Pain  Goal: Verbalizes/displays adequate comfort level or baseline comfort level  05/09/2024 0614 by Ilah Hoover, RN  Outcome: Progressing   Medicated with Oxycodone  5 mg po for complaints of left hip pain. Vital signs stable, afebrile. Continue to monitor alteration in comfort.

## 2024-05-09 NOTE — Plan of Care (Signed)
 Problem: Chronic Conditions and Co-morbidities  Goal: Patient's chronic conditions and co-morbidity symptoms are monitored and maintained or improved  Outcome: Progressing     Problem: Discharge Planning  Goal: Discharge to home or other facility with appropriate resources  05/09/2024 1105 by Kunu, Essi, RN  Outcome: Progressing     Problem: Pain  Goal: Verbalizes/displays adequate comfort level or baseline comfort level  05/09/2024 1105 by Kunu, Essi, RN  Outcome: Progressing     Problem: Safety - Adult  Goal: Free from fall injury. Pt educated on safety precautions. Call within reach  05/09/2024 1105 by Roe Crate, RN  Outcome: Progressing

## 2024-05-09 NOTE — Discharge Instr - COC (Addendum)
 Continuity of Care Form    Patient Name: Shelly Richard   DOB: 02/24/1986  MRN: [id number]    Admit date: 05/03/2024  Discharge date: {IJUZ:19854}    Advance Directives: None     Admitting Physician: Sheree HERO Evern Aid, MD  PCP: Alexandro Bernice BRAVO, [name]    Discharging Nurse: [facility] Unit/Room#: 6305/6305-01  Discharging Unit Phone Number: ***    Emergency Contact:   Extended Emergency Contact Information  Primary Emergency Contact: Walls,Romero  Mobile Phone: [phone]  Relation: Spouse  Preferred language: English  Interpreter needed? No  Secondary Emergency Contact: Lang,Nedra  Mobile Phone: [phone]  Relation: Brother/Sister    Past Surgical History:  Past Surgical History:   Procedure Laterality Date    CT FLUID COLLECTION DRAINAGE SOFT TISSUE  04/01/2024    CT FLUID COLLECTION DRAINAGE SOFT TISSUE 04/01/2024 [facility] CT SCAN    CYST REMOVAL      HIP SURGERY Left 04/02/2024    INCISION AND DRAINAGE OF LEFT HIP GREATER TROCHANTERIC BURSA performed by Sherline Lamar Gaskins, MD at [facility] OR    HYSTEROSCOPY      x2    MYOMECTOMY N/A 02/01/2022    ROBOTIC MYOMECTOMY performed by Foley, Chauncey Sharper, MD at [facility] OR       Immunization History:   Immunization History   Administered Date(s) Administered    COVID-19, Inactive, PFIZER PURPLE top, DILUTE for use, (age 44 y+) 03/17/2020, 04/10/2020    Hep B, ENGERIX-B, RECOMBIVAX-HB, (age 20y+), IM, 1mL 05/03/2021    Hep B, ENGERIX-B, RECOMBIVAX-HB, (age Birth - 8y), IM, 0.36mL 11/25/1997, 12/24/1997, 08/02/1998    Hepatitis B 12/29/2001, 12/29/2001, 02/29/2004, 02/29/2004, 10/09/2009, 10/09/2009    Influenza, FLUARIX, FLULAVAL, LURLEAN (age 85 mo+) and AFLURIA, (age 33 y+), Quadv PF, 0.33mL 02/25/2017    MMR, PRIORIX, M-M-R II, (age 71m+), SC, 0.21mL 11/25/1997, 03/31/2015    PPD Test 12/06/2008, 12/06/2008, 06/06/2020    Pneumococcal, PCV20, PREVNAR 20, (age 85w+), IM, 0.57mL 11/25/2023    Pneumococcal, PPSV23, PNEUMOVAX 23, (age 2y+),  SC/IM, 0.33mL 08/23/2014    TD 5LF, TENIVAC, (age 7y+), IM, 0.20mL 02/29/2004    TDaP, ADACEL (age 43y-64y), BOOSTRIX (age 10y+), IM, 0.29mL 09/08/2009, 10/09/2009, 11/14/2015    Td vaccine (adult) 11/25/1997, 02/29/2004    Td, unspecified formulation 02/29/2004       Active Problems:  Patient Active Problem List   Diagnosis Code    Mixed hyperlipidemia E78.2    Iron  deficiency anemia D50.9    Type 2 diabetes mellitus with hyperglycemia, with long-term current use of insulin  (HCC) E11.65, Z79.4    Essential hypertension I10    Acquired hypothyroidism E03.9    Mild intermittent asthma without complication J45.20    Allergic rhinitis J30.9    Heart murmur R01.1    Uterine fibroid D25.9    Class 1 obesity due to excess calories with serious comorbidity and body mass index (BMI) of 31.0 to 31.9 in adult E66.811, E66.09, Z68.31    Abnormal uterine bleeding N93.9    Eczema L30.9    Glaucoma suspect, bilateral H40.003    Hidradenitis axillaris L73.2    HSV-2 infection B00.9    Myopic astigmatism, bilateral H52.203, H52.13    Strabismus H50.9    S/P myomectomy Z98.890    DKA, type 2, not at goal [facility]) E11.10    Acute nonintractable headache R51.9    MSSA bacteremia R78.81, B95.61    Diabetic ketoacidosis without coma associated with type 2 diabetes mellitus (HCC) E11.10  Greater trochanteric bursitis of left hip M70.62    Abscess of bursa of left hip M71.052    Sepsis (HCC) A41.9    Type 2 diabetes mellitus with other specified complication (HCC) E11.69       Isolation/Infection:   Isolation            No Isolation          Patient Infection Status    No active infections.   Resolved       Infection Onset Added Last Indicated Last Indicated By Resolved Resolved By    COVID-19 04/22/19 04/23/19 04/22/19 COVID-19 05/06/19 Infection Expired                         Nurse Assessment:  Last Vital Signs: BP (!) 110/57   Pulse (!) 114   Temp 97.6 F (36.4 C) (Oral)   Resp 18   Ht 1.626 m (5' 4)   Wt 75.3 kg (166 lb)   SpO2  97%   BMI 28.49 kg/m     Last documented pain score (0-10 scale): Pain Level: 4  Last Weight:   Wt Readings from Last 1 Encounters:   05/06/24 75.3 kg (166 lb)     Mental Status: {IP PT MENTAL STATUS:20030}  Safety Concerns:     {MH COC Safety Concerns:304088272}  Impairments/Disabilities:      {MH COC Impairments/Disabilities:304088273}  Nursing Mobility/ADLs:  Walking {CHP DME JIOd:695911956}   Transfer {CHP DME JIOd:695911956}   Bathing {CHP DME JIOd:695911956}   Dressing {CHP DME JIOd:695911956}   Toileting {CHP DME JIOd:695911956}   Feeding {CHP DME JIOd:695911956}   Med Admin {CHP DME JIOd:695911956}   Med Delivery {MH COC MED Delivery:304088264}   Nutrition Therapy:  Current Nutrition Therapy:   {MH COC Diet List:304088271}  Routes of Feeding: {CHP DME Other Feedings:304088042}  Liquids: {Slp liquid thickness:30034}  Daily Fluid Restriction: {CHP DME Yes amt example:304088041}  Last Modified Barium Swallow with Video (Video Swallowing Test): {Done Not Done Ijuz:695911987}    Elimination:  Continence:   Bowel: {YES / WN:80272}  Bladder: {YES / WN:80272}  Urinary Catheter: {Urinary Catheter:304088013}   Colostomy/Ileostomy/Ileal Conduit: {YES / WN:80272}  Date of Last BM: {DATE:80145}    Treatments at the Time of Hospital Discharge:  Respiratory Treatments: ***  Respiratory Support:  {COC Oxygen Therapy:304810065}  {MH CC Vent Opdu:695911888}  IV Access:  {MH COC IV JRRZDD:695911737}  Wound Care Documentation and Therapy:  Wound 05/07/24 Surgical Closed Surgical Incision Hip Left;Proximal (Active)   Wound Type Incision 05/07/24 2052   Incision Dressing Status Intact;Old drainage noted 05/09/24 0739   Incision Cleansed Wound cleanser 05/07/24 1644   Incision Dressing/Treatment Tegaderm/transparent film dressing;Foam 05/09/24 0739   Incision Closure Other (Comment) 05/07/24 1825   Incision Assessment Other (Comment) 05/07/24 1712   Incision Drainage Amount None (dry) 05/07/24 1712   Incision Drainage  Description Serosanguinous 05/09/24 0739   Incision Odor None 05/09/24 0739   Number of days: 1     Additional Wound Care Instructions/Orders: ***  Rehab Therapies: {THERAPEUTIC INTERVENTION:[phone]}  Weight Bearing Status/Restrictions: {MH CC Weight Bearing:304508812}  Other Medical Equipment (for information only, NOT a DME order):    {EQUIPMENT:304520077}  Other Treatments: ***    Patient's personal belongings on hospital discharge:  Dental Appliances: None  Vision - Corrective Lenses: Eyeglasses, At home  Hearing Aid: None  Clothing: Footwear, Jacket/Coat, Pants, Shirt, At bedside  Jewelry: Bracelet, Necklace, Ring, Other (Comment) (ON PT)  Body Piercings Removed:  No  Electronic Devices: Cell Phone, Consulting Civil Engineer, Other (Comment), At bedside (IPAD)  Weapons (Insurance Underwriter): None  Other Valuables: Purse  Home Medications: None  Valuables Given To: Patient  Provide Name(s) of Who Valuable(s) Were Given To: Shy     RN SIGNATURE: {Esignature:304088025}    CASE MANAGEMENT/SOCIAL WORK SECTION    Inpatient Status Date: 05/11/24    Readmission Risk Assessment Score:  [facility] RISK OF UNPLANNED READMISSION 2.0             17.5 Total Score        Discharging to Facility/ Agency   [facility]   [address], [place], [place] [address]  [phone]    [facility]  [address] [place], [place] [address]  [phone]      CASE MANAGER/SOCIAL WORKER SIGNATURE: Electronically signed by Barnie Bayley, RN on 05/11/24 at 12:29 PM EST    PHYSICIAN SECTION  Code Status (should be the appropriate Code Status for the state that the patient will be residing in):  Full Code    Medical Prognosis: Good    Condition at Discharge: Stable    Rehab Potential: Good    Recommended Labs, Imaging Studies, or Other Treatments After Discharge:     INFUSION ORDERS:  - Drug: iv cefazolin  6g q24h ATC as a continuous infusion daily  - Planned End date: 06/04/2024  - Diagnosis: MSSA  left hip bursitis and bacteremia   - Has received test dose in hospital  - Routine line maintenance  - Check CBC w diff, CMP, ESR, CRP every Mon or Tue - FAX result to [phone]  - Call with antibiotic / infusion issues, [phone]  - Call with any change in status, transfer in or out of a facility or to hospital; This COC is only valid for current disposition - [phone].    - No f/u in outpatient ID office necessary    Physician Certification: I certify the above information and transfer of Maymie Brunke  is necessary for the continuing treatment of the diagnosis listed and that she requires Home Care for a short term stay (estimated stay of 30 days or less).     Update Admission H&P: No change in H&P    PHYSICIAN SIGNATURE:  Electronically signed by Karlene VEAR North, MD on 05/11/24 at 11:54 AM EST

## 2024-05-09 NOTE — Plan of Care (Signed)
 Problem: Pain  Goal: Verbalizes/displays adequate comfort level or baseline comfort level  05/09/2024 0208 by Ilah Hoover, RN  Outcome: Progressing   Left hip dressing C/D/I. Pt ambulating in  room ad lib. Admits to mild discomfort to area. Vital signs stable, afebrile. Continue on IV ATBx. Call light in reach.

## 2024-05-09 NOTE — Progress Notes (Addendum)
 V2.0    [facility] Progress Note      Name:  Shelly Richard DOB/Age/Sex: 20-Feb-1986  (39 y.o. female)   MRN & CSN:  [id number] & [id number] Encounter Date/Time: 05/09/2024 9:58 AM EST   Location:  6305/6305-01 PCP: Alexandro Bernice BRAVO, [name]     Attending:Aowyn Rozeboom, Edyth RAMAN, MD       Hospital Day: 7    Assessment and Recommendations     39 year old female with a history of uncontrolled diabetes mellitus with last A1c of 13.9, recent hospitalization for DKA which was complicated by left hip bursitis requiring I&D and long-term IV antibiotics presenting from home with tachycardia, fever spikes.  Concern for recurrence of left hip bursitis.  Currently with MRI of the left hip showing persistent fasciitis, bursitis, abscess formation along the gluteus medius, lateral upper thigh musculature fossa, greater trochanteric bursa.  Blood cultures are growing MSSA.I and D of hip done yesterday    Fever  Possible sepsis secondary to left hip bursitis  MSSA bacteremia  Post hip I&D await culture results continue current antibiotics infectious disease consult and orthopedics consult noted  - Follow-up Ortho consult.  -Continue IV Ancef .  - ID consult.  Discussed with ID.   -Continue to follow repeat blood cultures  - TTE without significant findings  - if cultures negative will place picc line in am and poss dc.   -    Hypothyroidism  - Continue Synthroid .    Uncontrolled diabetes mellitus 13.9% last A1c  - -improved today blood sugars in the 120s.   -  meds adjusted    Hypertension  - Continue losartan     Microcytic anemia  - Continue iron  supplementation.    Vaginal discharge  -Send for UA.     Diet ADULT DIET; Regular   DVT Prophylaxis [x]  Lovenox , []   Heparin, []  SCDs, []  Ambulation,  []  Eliquis, []  Xarelto  []  Coumadin   Code Status Full Code   Disposition From: Home  Expected Disposition: tbd  Estimated Date of Discharge: tbd  Patient requires continued admission due to Sepsis   Surrogate Decision Maker/ POA  [name] listed as primary contact.      Personally reviewed Lab Studies and Imaging     Discussed management of the case with case management.  I reviewed IDs progress note.        Subjective:     Patient seen and evaluated sitting in chair.       Review of Systems:      Pertinent positives and negatives discussed in HPI    Objective:   No intake or output data in the 24 hours ending 05/09/24 1309       Vitals:   Vitals:    05/09/24 0609 05/09/24 0738 05/09/24 1106 05/09/24 1122   BP:  (!) 110/57 (!) 110/57 108/72   Pulse:  (!) 114 (!) 114 (!) 105   Resp: 18 18 18 17    Temp:  97.6 F (36.4 C) 97.6 F (36.4 C) 97.3 F (36.3 C)   TempSrc:  Oral Oral Oral   SpO2:   97% 98%   Weight:       Height:             Physical Exam:      General: NAD  Eyes: EOMI  ENT: neck supple  Cardiovascular: Tachycardic.   Respiratory: Bilateral symmetric expansion  Gastrointestinal: Active bowel sounds.   Genitourinary: no suprapubic tenderness  Musculoskeletal: No edema  Skin:  warm, dry  Neuro: Oriented x3.  Psych: Mood appropriate.         Medications:   Medications:    sodium chloride  flush  5-40 mL IntraVENous 2 times per day    lidocaine  1 % injection  50 mg IntraDERmal Once    insulin  glargine  45 Units SubCUTAneous Nightly    insulin  lispro  5 Units SubCUTAneous TID WC    ceFAZolin   2,000 mg IntraVENous q8h    ferrous sulfate   325 mg Oral Daily with breakfast    levothyroxine   25 mcg Oral Daily    losartan   25 mg Oral Daily    atorvastatin   20 mg Oral Nightly    sodium chloride  flush  5-40 mL IntraVENous 2 times per day    enoxaparin   40 mg SubCUTAneous Daily    sodium chloride  flush  5-40 mL IntraVENous 2 times per day    insulin  lispro  0-8 Units SubCUTAneous 4x Daily AC & HS      Infusions:    sodium chloride       sodium chloride       sodium chloride       dextrose        PRN Meds: sodium chloride  flush, 5-40 mL, PRN  sodium chloride , , PRN  sulfur  hexafluoride microspheres, 2 mL, ONCE PRN  oxyCODONE , 5 mg, Q6H PRN  sodium  chloride flush, 5-40 mL, PRN  sodium chloride , , PRN  magnesium  sulfate, 2,000 mg, PRN  ondansetron , 4 mg, Q8H PRN   Or  ondansetron , 4 mg, Q6H PRN  polyethylene glycol, 17 g, Daily PRN  acetaminophen , 650 mg, Q6H PRN   Or  acetaminophen , 650 mg, Q6H PRN  sodium chloride  flush, 5-40 mL, PRN  sodium chloride , , PRN  glucose, 4 tablet, PRN  dextrose  bolus, 125 mL, PRN   Or  dextrose  bolus, 250 mL, PRN  glucagon , 1 mg, PRN  dextrose , , Continuous PRN        Labs and Imaging   XR HIP 2-3 VW W PELVIS LEFT  Result Date: 05/03/2024  XR HIP 2-3 VW W PELVIS LEFT Indication: Left hip pain. History trochanteric bursitis. COMPARISON: 03/31/2024 Findings: Frontal view the pelvis and frog-leg lateral view of the left hip were obtained. The bones are well-mineralized. There is no acute fracture or dislocation. The regional soft tissues are unremarkable.     Impression: No acute osseous abnormality. Electronically signed by Belvie Plana, MD    XR CHEST PORTABLE  Result Date: 05/03/2024  XR CHEST PORTABLE Indication: Cough COMPARISON: 03/27/2024 Findings: Portable AP upright view of the chest was obtained. The heart is normal in size and configuration. The lungs are clear. There is no effusion or pneumothorax.     Impression: No acute cardiopulmonary abnormality. Electronically signed by Belvie Plana, MD      CBC:   Recent Labs     05/07/24  0516 05/08/24  0602 05/09/24  0557   WBC 9.4 12.1* 11.9*   HGB 10.4* 10.4* 9.9*   PLT 399 451* 425     BMP:    Recent Labs     05/07/24  0516 05/08/24  0602 05/09/24  0558   NA 138 136 141   K 3.7 4.4 3.5   CL 102 100 102   CO2 25 24 28    BUN 13 11 14    CREATININE 0.7 0.7 0.8   GLUCOSE 139* 320* 89     Hepatic:   No results for input(s): AST, ALT, BILITOT, ALKPHOS in  the last 72 hours.    Invalid input(s): ALB    Lipids:   Lab Results   Component Value Date/Time    CHOL 154 11/25/2023 02:44 PM    HDL 22 11/25/2023 02:44 PM    TRIG 170 11/25/2023 02:44 PM     Hemoglobin A1C:   Lab  Results   Component Value Date/Time    LABA1C 8.5 05/04/2024 10:40 AM     TSH:   Lab Results   Component Value Date/Time    TSH 1.92 08/27/2022 04:35 PM     Troponin: No results found for: TROPONINT  Lactic Acid: No results for input(s): LACTA in the last 72 hours.  BNP: No results for input(s): PROBNP in the last 72 hours.  UA:  Lab Results   Component Value Date/Time    NITRU Negative 05/07/2024 04:04 PM    COLORU Yellow 05/07/2024 04:04 PM    PHUR 6.0 05/07/2024 04:04 PM    PHUR 5.5 02/25/2017 12:22 PM    WBCUA 51-100 05/07/2024 04:04 PM    RBCUA 11-20 05/07/2024 04:04 PM    TRICHOMONAS Present 05/03/2024 04:37 PM    BACTERIA 1+ 05/07/2024 04:04 PM    CLARITYU Clear 05/07/2024 04:04 PM    LEUKOCYTESUR MODERATE 05/07/2024 04:04 PM    UROBILINOGEN 0.2 05/07/2024 04:04 PM    BILIRUBINUR Negative 05/07/2024 04:04 PM    BLOODU SMALL 05/07/2024 04:04 PM    GLUCOSEU Negative 05/07/2024 04:04 PM    KETUA Negative 05/07/2024 04:04 PM     Urine Cultures:   Lab Results   Component Value Date/Time    LABURIN No growth at 18 to 36 hours 05/07/2024 04:04 PM     Blood Cultures:   Lab Results   Component Value Date/Time    [facility]  05/05/2024 02:20 PM     No Growth to date.  Any change in status will be called.     Lab Results   Component Value Date/Time    BLOODCULT2  05/05/2024 02:20 PM     No Growth to date.  Any change in status will be called.     Organism:   Lab Results   Component Value Date/Time    ORG Staph aureus DNA Detected 05/03/2024 03:10 PM    ORG Staphylococcus aureus 05/03/2024 03:10 PM         Electronically signed by Edyth GORMAN Lash, MD on 05/09/2024 at 1:10 PM

## 2024-05-10 LAB — BASIC METABOLIC PANEL
Anion Gap: 8 (ref 3–16)
BUN: 9 mg/dL (ref 7–20)
CO2: 28 mmol/L (ref 21–32)
Calcium: 9 mg/dL (ref 8.3–10.6)
Chloride: 101 mmol/L (ref 99–110)
Creatinine: 0.6 mg/dL (ref 0.6–1.1)
Est, Glom Filt Rate: >90
Glucose: 173 mg/dL — ABNORMAL HIGH (ref 70–99)
Potassium: 3.5 mmol/L (ref 3.5–5.1)
Sodium: 137 mmol/L (ref 136–145)

## 2024-05-10 LAB — CBC WITH AUTO DIFFERENTIAL
Basophils %: 0.6 %
Basophils Absolute: 0.1 10*3/uL (ref 0.0–0.2)
Eosinophils %: 1.5 %
Eosinophils Absolute: 0.2 10*3/uL (ref 0.0–0.6)
Hematocrit: 28.1 % — ABNORMAL LOW (ref 36.0–48.0)
Hemoglobin: 9.5 g/dL — ABNORMAL LOW (ref 12.0–16.0)
Lymphocytes %: 28.8 %
Lymphocytes Absolute: 3.1 10*3/uL (ref 1.0–5.1)
MCH: 26.8 pg (ref 26.0–34.0)
MCHC: 33.8 g/dL (ref 31.0–36.0)
MCV: 79.1 fL — ABNORMAL LOW (ref 80.0–100.0)
MPV: 7.9 fL (ref 5.0–10.5)
Monocytes %: 6.4 %
Monocytes Absolute: 0.7 10*3/uL (ref 0.0–1.3)
Neutrophils %: 62.7 %
Neutrophils Absolute: 6.7 10*3/uL (ref 1.7–7.7)
Platelets: 406 10*3/uL (ref 135–450)
RBC: 3.55 M/uL — ABNORMAL LOW (ref 4.00–5.20)
RDW: 16.1 % — ABNORMAL HIGH (ref 12.4–15.4)
WBC: 10.6 10*3/uL (ref 4.0–11.0)

## 2024-05-10 LAB — POCT GLUCOSE
POC Glucose: 149 mg/dL — ABNORMAL HIGH (ref 70–99)
POC Glucose: 163 mg/dL — ABNORMAL HIGH (ref 70–99)
POC Glucose: 145 mg/dL — ABNORMAL HIGH (ref 70–99)
POC Glucose: 272 mg/dL — ABNORMAL HIGH (ref 70–99)

## 2024-05-10 LAB — CULTURE, BLOOD 1: Blood Culture, Routine: NO GROWTH

## 2024-05-10 LAB — CULTURE, BLOOD 2: Culture, Blood 2: NO GROWTH

## 2024-05-10 MED ORDER — FLUCONAZOLE 200 MG PO TABS
200 | Freq: Every day | ORAL | Status: AC
Start: 2024-05-10 — End: 2024-05-11
  Administered 2024-05-10 – 2024-05-11 (×2): 200 mg via ORAL

## 2024-05-10 MED FILL — INSULIN LISPRO 100 UNIT/ML IJ SOLN: 100 [IU]/mL | INTRAMUSCULAR | Qty: 4

## 2024-05-10 MED FILL — ENOXAPARIN SODIUM 40 MG/0.4ML IJ SOSY: 40 MG/0.4ML | INTRAMUSCULAR | Qty: 0.4

## 2024-05-10 MED FILL — FLUCONAZOLE 200 MG PO TABS: 200 mg | ORAL | Qty: 1

## 2024-05-10 MED FILL — LOSARTAN POTASSIUM 25 MG PO TABS: 25 mg | ORAL | Qty: 1

## 2024-05-10 MED FILL — INSULIN LISPRO 100 UNIT/ML IJ SOLN: 100 [IU]/mL | INTRAMUSCULAR | Qty: 5

## 2024-05-10 MED FILL — ATORVASTATIN CALCIUM 20 MG PO TABS: 20 mg | ORAL | Qty: 1

## 2024-05-10 MED FILL — FERROUS SULFATE 325 (65 FE) MG PO TABS: 325 (65 Fe) MG | ORAL | Qty: 1

## 2024-05-10 MED FILL — CEFAZOLIN SODIUM 1 G IJ SOLR: 1 g | INTRAMUSCULAR | Qty: 2000

## 2024-05-10 MED FILL — OXYCODONE HCL 5 MG PO TABS: 5 mg | ORAL | Qty: 1

## 2024-05-10 MED FILL — LANTUS 100 UNIT/ML SC SOLN: 100 [IU]/mL | SUBCUTANEOUS | Qty: 45

## 2024-05-10 MED FILL — LEVOTHYROXINE SODIUM 25 MCG PO TABS: 25 ug | ORAL | Qty: 1

## 2024-05-10 NOTE — Plan of Care (Signed)
 Problem: Chronic Conditions and Co-morbidities  Goal: Patient's chronic conditions and co-morbidity symptoms are monitored and maintained or improved  Outcome: Progressing     Problem: Discharge Planning  Goal: Discharge to home or other facility with appropriate resources  05/10/2024 1454 by Roe Crate, RN  Outcome: Progressing     Problem: Pain  Goal: Verbalizes/displays adequate comfort level or baseline comfort level  05/10/2024 1454 by Roe Crate, RN  Outcome: Progressing     Problem: Safety - Adult  Goal: Free from fall injury  05/10/2024 1454 by Roe Crate, RN  Outcome: Progressing

## 2024-05-10 NOTE — Procedures (Signed)
 SINGLE PICC PROCEDURE NOTE  Chart reviewed for allergies, diagnosis, labs, known contraindications, reason for line placement and planned length of treatment.  Informed consent noted to be signed and on chart.  Insertion procedure discussed with patient/family member.  Three patient identifiers - Patient name,  DOB and MRN -  completed &  confirmed verbally.         Time out performed.  Hat, mask and eye shield donned.  PICC site cleaned with chlorhexidine wipes then scrubbed with Chloraprep one-step applicator for 30 seconds x 2.   Hand hygiene performed prior to sterile gown and gloves being donned.  Patient draped using maximal sterile barrier technique.  PICC site scrubbed  with Chloraprep One-Step applicator x 30 sec after draping.   1% Lidocaine  5 ml injected intradermal pre-insertion.  Modified Seldinger technique/ultrasound assist performed with vein access.  3CG/ECG technology locating system utilized for insertion and PICC tip location in the SVC.   Positive brisk blood return obtained from all lumens.  Valves placed to all lumens and flushed with 10 mls 0.9% Sterile Sodium Chloride .  All lumens flush easily with no resistance.  Skin prep applied to site.   Catheter secured with non-sutured locking device per hospital protocol. Bio-patch/CHG impregnated sterile tegaderm dressing applied.  Alcohol Swab Caps applied to each valve.  Sterile field maintained during procedure.  PICC insertion, rhythm and positioning wire accounted for post procedure and disposed of in sharps.  Appearance of site is Clean dry and intact without bleeding or edema. All edges of Tegaderm occlusive.   Site marked with date and initials of RN placing line. Teaching performed to patient/family and noted in education section.   Bed placed in low position post-procedure. Top 2 side rails in up position call button in reach.  Patient response to procedure tolerated well.   RN notified of all of the above.  A Single Lumen Power Injectable PICC was trimmed at 40 CM and placed LUA per basilic vein, 0 cm showing from insertion site.

## 2024-05-10 NOTE — Progress Notes (Signed)
 Department of Orthopedic Surgery  Progress Note      SUBJECTIVE  doing well, minimal pain, similar to pre op, walking well, no fevers chills.    OBJECTIVE    Physical  - L hip dressing c/d/I. Minimal edema no erythema. NVID.  Packing removed today with minimal drainage.      VITALS:  BP 128/78   Pulse 96   Temp 98.2 F (36.8 C) (Oral)   Resp 18   Ht 1.626 m (5' 4)   Wt 75.3 kg (166 lb)   SpO2 95%   BMI 28.49 kg/m   24HR INTAKE/OUTPUT:    No intake or output data in the 24 hours ending 05/10/24 1211    URINARY CATHETER OUTPUT (Foley):         Data    CBC:   Lab Results   Component Value Date/Time    WBC 10.6 05/10/2024 06:12 AM    RBC 3.55 05/10/2024 06:12 AM    HGB 9.5 05/10/2024 06:12 AM    HCT 28.1 05/10/2024 06:12 AM    MCV 79.1 05/10/2024 06:12 AM    MCH 26.8 05/10/2024 06:12 AM    MCHC 33.8 05/10/2024 06:12 AM    RDW 16.1 05/10/2024 06:12 AM    PLT 406 05/10/2024 06:12 AM    MPV 7.9 05/10/2024 06:12 AM     BMP:    Lab Results   Component Value Date/Time    NA 137 05/10/2024 06:12 AM    K 3.5 05/10/2024 06:12 AM    K 2.9 05/04/2024 05:07 AM    CL 101 05/10/2024 06:12 AM    CO2 28 05/10/2024 06:12 AM    BUN 9 05/10/2024 06:12 AM    CREATININE 0.6 05/10/2024 06:12 AM    CALCIUM  9.0 05/10/2024 06:12 AM    GFRAA >60 08/11/2019 12:18 PM    LABGLOM >90 05/10/2024 06:12 AM    LABGLOM >60 02/02/2022 05:10 AM    GLUCOSE 173 05/10/2024 06:12 AM     Current Inpatient Medications    Current Facility-Administered Medications: sodium chloride  flush 0.9 % injection 5-40 mL, 5-40 mL, IntraVENous, 2 times per day  sodium chloride  flush 0.9 % injection 5-40 mL, 5-40 mL, IntraVENous, PRN  0.9 % sodium chloride  infusion, , IntraVENous, PRN  insulin  glargine (LANTUS ) injection vial 45 Units, 45 Units, SubCUTAneous, Nightly  sulfur  hexafluoride microspheres (LUMASON ) 60.7-25 MG injection 2 mL, 2 mL, IntraVENous, ONCE PRN  oxyCODONE  (ROXICODONE ) immediate release tablet 5 mg, 5 mg, Oral, Q6H PRN  insulin  lispro  (HUMALOG ,ADMELOG ) injection vial 5 Units, 5 Units, SubCUTAneous, TID WC  ceFAZolin  (ANCEF ) 2,000 mg in sterile water  20 mL IV syringe, 2,000 mg, IntraVENous, q8h  ferrous sulfate  (IRON  325) tablet 325 mg, 325 mg, Oral, Daily with breakfast  levothyroxine  (SYNTHROID ) tablet 25 mcg, 25 mcg, Oral, Daily  losartan  (COZAAR ) tablet 25 mg, 25 mg, Oral, Daily  atorvastatin  (LIPITOR ) tablet 20 mg, 20 mg, Oral, Nightly  sodium chloride  flush 0.9 % injection 5-40 mL, 5-40 mL, IntraVENous, 2 times per day  sodium chloride  flush 0.9 % injection 5-40 mL, 5-40 mL, IntraVENous, PRN  0.9 % sodium chloride  infusion, , IntraVENous, PRN  magnesium  sulfate 2000 mg in 50 mL IVPB premix, 2,000 mg, IntraVENous, PRN  enoxaparin  (LOVENOX ) injection 40 mg, 40 mg, SubCUTAneous, Daily  ondansetron  (ZOFRAN -ODT) disintegrating tablet 4 mg, 4 mg, Oral, Q8H PRN **OR** ondansetron  (ZOFRAN ) injection 4 mg, 4 mg, IntraVENous, Q6H PRN  polyethylene glycol (GLYCOLAX ) packet 17 g, 17 g, Oral, Daily  PRN  acetaminophen  (TYLENOL ) tablet 650 mg, 650 mg, Oral, Q6H PRN **OR** acetaminophen  (TYLENOL ) suppository 650 mg, 650 mg, Rectal, Q6H PRN  sodium chloride  flush 0.9 % injection 5-40 mL, 5-40 mL, IntraVENous, 2 times per day  sodium chloride  flush 0.9 % injection 5-40 mL, 5-40 mL, IntraVENous, PRN  0.9 % sodium chloride  infusion, , IntraVENous, PRN  glucose chewable tablet 16 g, 4 tablet, Oral, PRN  dextrose  bolus 10% 125 mL, 125 mL, IntraVENous, PRN **OR** dextrose  bolus 10% 250 mL, 250 mL, IntraVENous, PRN  glucagon  injection 1 mg, 1 mg, SubCUTAneous, PRN  dextrose  10 % infusion, , IntraVENous, Continuous PRN  insulin  lispro (HUMALOG ,ADMELOG ) injection vial 0-8 Units, 0-8 Units, SubCUTAneous, 4x Daily AC & HS    ASSESSMENT AND PLAN      POD 3 repeat L hip troch bursa I&D for septic bursitis involving glut medius     -Continue current mgmt.  Plan for d/c home likely tomorrow.  -Activity as tolerated   -Glucose control  -monitor cultures, appreciate ID recs  for home going abx      Vicenta Croak, [place]

## 2024-05-10 NOTE — Care Coordination (Signed)
 DISCHARGE PLANNING:    Chart Reviewed:    CM  spoke with  Dr He ,  who stated she  placed  ID rec: On  COC;    INFUSION ORDERS:  - Drug: iv cefazolin  6g q24h ATC as a continuous infusion daily  - Planned End date: 06/04/2024  - Diagnosis: MSSA left hip bursitis and bacteremia   - Has received test dose in hospital  - Routine line maintenance  - Check CBC w diff, CMP, ESR, CRP every Mon or Tue - FAX result to [phone]  - Call with antibiotic / infusion issues, [phone]    - Call with any change in status, transfer in or out of a facility or to hospital; This COC is only valid for current disposition - [phone].      Benefits were being run by [facility] on Friday CM following up.   - No f/u in outpatient ID office necessary       PICC to place  Line today     Will d/c home with  [facility] as prior to admission  with [facility] and  [facility].       Admitted with Sepsis:     -   Pt is IPTA and plans to return home at discharge. Had PICC line removed  ,  has  PIV and will likely beed  new PICC and  IV atbx at  d/c.  Per  ID      Active  with  [facility] and [facility] :    [address] [facility]  [address], [address], [place], [place] [address]  [phone]  [phone]    [facility] - [address]  [address] [place], [place] [address]  [phone]  [phone]    -  ID following.and rec noted  above.    -   Pt is IPTA and states her boyfriend will transport her home.  -   Pt is active with [facility] and [facility].      BARRIERS:  Ortho Surg:   PLAN:      WBAT :   plan to take back to OR today; for formal I&D of the left hip again.  NPO after MN.       The Plan for Transition of Care is related to the following treatment goals of Sepsis (HCC) [A41.9]  Sepsis without acute organ dysfunction, due to unspecified organism [facility]) [A41.9]      CM cont to follow     Electronically signed by Darice Croft, RN on 05/10/2024 at 11:10 AM       Darice Croft  RN Case Manager  [facility]  [address]  [place] [address]  [phone]  Fax [phone]

## 2024-05-10 NOTE — Discharge Instructions (Addendum)
 Continue antibiotics as prescribed  Monitor Blood Glucose, we adjust insulin  doses.  Increase Lantus  (Long acting insulin ) to 45 units nightly  WITH MEALS USE SHORT ACTING - INJECT 5 UNITS UNDER THE SKIN three times with meals PLUS sliding scale -  2 UNITS FOR BLOOD SUGAR 150-200, 4 UNITS IF 201-250, 6 UNITS IF 251-300, 8 UNITS IF 301-350, 10 UNITS IF GREATER THAN 350.  Oxycodone  5 mg three time as day as needed for severe pain. Primarily use Tylenol  and Ibuprofen  for pain control  Follow up with Primary care physician  Follow up with orthopedic surgery  Activity as tolerated    Caring for Your Peripherally Inserted Central Catheter (PICC)    It was a pleasure helping you.  Call Your Primary Infusion Nurse or Ordering Doctor with any questions. If there are any questions for the Vascular Access Team here at [facility] our number is [phone] leave a message and we will call you back as soon as possible.    We are available Monday through Friday 730 to 5pm.      You are going home with a peripherally inserted catheter (PICC). This small, soft tube has been placed in a vein in your arm. It is often used when treatment requires medications or nutrition for weeks or months. At home, you need to take care of your PICC to keep it working. Since a PICC line can have an infection risk, you must take extra care washing your hands and preventing the spread of germs. This sheet will help you remember what to do to care for your PICC at home.      Understanding Your Role  A nurse or other healthcare provider will teach you and your caregivers how to care for the PICC. Before leaving the hospital, make sure that you understand what to do at home, how long you may need the PICC, and when to have a follow up visit.  You will likely be told to flush the PICC with saline or heparin solution. You may also be told to change the catheter's injection caps and change the dressing (Bandage). Or, a nurse may do this for you during a  follow-up visit. Only do these things if you are told to, following the instructions you were given.    Protecting the PICC  If the PICC gets damaged, it won't work right and could raise you chance of infection. Call your healthcare team right away if any damage occurs. To protect your PICC at home:  Prevent infection. Use good hand hygiene by following the guidelines on this sheet. Don't touch the catheter or the dressing unless you need to. Always clean your hands before and after you come in contact with any part of the PICC. Your caregivers, family members, any visitors should use good hand hygiene also.  Keep the PICC dry. The catheter and dressing must stay dry. Don't take baths, go swimming, use a hot tub, or do other activities that could get the PICC wet. When showering, cover the area with plastic wrap or another cover as recommended by your healthcare provider. Keep the area out of the water  spray. If the dressing gets wet, change it only if you have been shown how. Otherwise, call your healthcare team right away.  Avoid damage. Don't use any sharp or pointy objects around the catheter. This includes scissors, pins, knives, razors, or anything else that could puncture or cut it. Also, don't let anything pull or rub on the catheter, such as  clothing.  Watch for signs of problems. Pay attention to how much of the catheter that sticks out from your skin. If this changes at all, let your healthcare provider know. Also watch for cracks, leaks, or other damage. If the dressing becomes dirty, loose, or wet, change it (if you have been instructed to) or call your healthcare team.  Avoid lowering your chest below your waist. This included bending at the waist for actions like tying your shoes. When your chest is positioned below your waist, especially for a long time, the catheter's internal tip could slip out of place in the vein.  Tell your healthcare team if you vomit or have severe coughing. This can make the  catheter slip out of place.    Protecting Your Arm  The arm with the PICC is at risk for developing blood clots (thrombosis). This is a serious complication. To help prevent it:SABRA As much as possible, use the arm with the PICC in it for normal daily activities. Lack of movement can lead to blood clots, so it's important to move your arm as you normally would.  Avoid activities or exercises that require major use of your arm, such as sports, unless your healthcare provider says it's okay.  Avoid activities that cause discomfort in your arm. Talk to your healthcare team if you have concerns about pain or range of motion.  Don't lift anything heavier than 10 pounds with the affected arm.  Drink plenty of water . Staying hydrated helps keep blood clots from forming.    Prevent Infection with Good Hand Hygiene  A PICC can let germs into your body. This can lead to serious and sometimes deadly infections. To prevent infection, it's very important that you, your caregivers and others around you use good hand hygiene. This means washing your hands well with soap and water , and cleaning them with alcohol based hand gel as directed. Never touch the PICC or dressing without first using one of the methods.   To wash your hands with soap and water :  Wet your hands with warm water . (Avoid hot water , which can cause skin irritation when you wash your hands often).  Apply enough soap to cover the entire surface of your hands, including fingers.  Rub your hands together vigorously for at least 15 seconds. Make sure to rub the front and back of each hand up to the wrist, your fingers and fingernails, between the fingers, and each thumb.  Rinse your hands with warm water   Dry your hands completely with a new paper towel. Don't use a cloth towel or other reusable towel. These can harbor  germs.  Use the paper towel to turn off the faucet, then throw it away. If you are in a bathroom, also use a paper towel to open the door instead of  touching the handle.  When you don't have access to soap and water : Use alcohol-based hand gel. The gel should have at least 60% alcohol. Follow the instructions on the package. Your healthcare team can answer any questions you have about when to use hand gel, or when it's better to wash with soap and water .                                         When to Call Your Healthcare Provider  Call your provider right away if you have any of the following:  Pain  or burning in your shoulder, chest, back, arm, or leg  Fever of 100.4 F or higher  Chills  Signs of infection at the catheter site (pain, redness, drainage, burning, or stinging  Coughing, wheezing, or shortness of breath  A racing or irregular heartbeat  Muscle stiffness or trouble moving  Tightness in your arm, above the catheter site  Gurgling noises coming from the catheter  The catheter gets pulled out, breaks, cracks, leaks, or has other damage     Keep dressing clean and dry.  Change Mepilex every 3-5 days or as needed for excess drainage.  Please call physician if increased redness around incision, increased drainage, fevers, or pain becomes significantly worse.  Do not immerse in water  such as baths but okay to shower with dressing on to protect wound.    F/U with Dr Deretha in 10-14 days.  Call [facility] for appointment time and date for follow up at [phone].        Weight Bearing on Surgical Side: full    Continue DVT Prophylaxis: Continue with ambulation.    Pain Medications  You were given oxycodone  (Oxycontin , Oxyir)  Wean off pain medications as you deem appropriate as long as pain is under control  Be sure to drink plenty of fluids (recommend water ) while taking narcotic pain medications to prevent constipation   You may take an over the counter laxative or stool softener as needed to prevent/treat constipation as well, we recommend Senokot S OTC.  We recommend that you consider taking these medications the entire  time you are taking pain medication.  Cold packs/Ice packs/Machine  May be used as much as necessary to control swelling/inflammation/soreness  Be sure to have a barrier (cloth, clothing, towel) between the site and the ice pack to prevent frostbite  Contact [facility] office [phone] if  Increased redness, swelling, drainage of any kind, and/or pain to surgery site.  As well as new onset fevers and or chills.  These could signify an infection.  Calf or thigh tenderness to touch as well as increased swelling or redness.  This could signify a clot formation.  Numbness or tingling to an area around the incision site or below the incision site (toes).  Any rash appears, increased  or new onset nausea/vomiting occur.  This may indicate a reaction to a medication.

## 2024-05-10 NOTE — Progress Notes (Signed)
 V2.0    [facility] Progress Note      Name:  Shelly Richard DOB/Age/Sex: 07/11/1985  (39 y.o. female)   MRN & CSN:  [id number] & [id number] Encounter Date/Time: 05/10/2024 9:58 AM EST   Location:  6305/6305-01 PCP: Alexandro Bernice BRAVO, [name]     Attending:Brihana Quickel, Edyth RAMAN, MD       Hospital Day: 8    Assessment and Recommendations     39 year old female with a history of uncontrolled diabetes mellitus with last A1c of 13.9, recent hospitalization for DKA which was complicated by left hip bursitis requiring I&D and long-term IV antibiotics presenting from home with tachycardia, fever spikes.  Concern for recurrence of left hip bursitis.  Currently with MRI of the left hip showing persistent fasciitis, bursitis, abscess formation along the gluteus medius, lateral upper thigh musculature fossa, greater trochanteric bursa.  Blood cultures are growing MSSA.I and D of hip done yesterday    Fever  Possible sepsis secondary to left hip bursitis  MSSA bacteremia  Post hip I&D await culture results continue current antibiotics infectious disease consult and orthopedics consult noted  - Follow-up Ortho consult.  -Continue IV Ancef .  - ID consult.  Discussed with ID.   -Continue to follow repeat blood cultures  - TTE without significant findings  - if cultures negative will place picc line in am and poss dc tomorrow  - PICC line placed    Hypothyroidism  - Continue Synthroid .    Uncontrolled diabetes mellitus 13.9% last A1c  - -improved today blood sugars in the 120s.   -  meds adjusted    Hypertension  - Continue losartan     Microcytic anemia  - Continue iron  supplementation.    Vaginal discharge  -Send for UA.     Diet ADULT DIET; Regular   DVT Prophylaxis [x]  Lovenox , []   Heparin, []  SCDs, []  Ambulation,  []  Eliquis, []  Xarelto  []  Coumadin   Code Status Full Code   Disposition From: Home  Expected Disposition: tbd  Estimated Date of Discharge: tbd  Patient requires continued admission due to Sepsis   Surrogate  Decision Maker/ POA  [name] listed as primary contact.      Personally reviewed Lab Studies and Imaging     Discussed management of the case with case management.  I reviewed IDs progress note.        Subjective:     Patient seen and evaluated sitting in chair.   Got PICC line      Review of Systems:      Pertinent positives and negatives discussed in HPI    Objective:   No intake or output data in the 24 hours ending 05/10/24 1506       Vitals:   Vitals:    05/10/24 0724 05/10/24 0920 05/10/24 0950 05/10/24 1131   BP: 125/87   128/78   Pulse: 95   96   Resp: 18 17 17 18    Temp: 97.3 F (36.3 C)   98.2 F (36.8 C)   TempSrc: Oral   Oral   SpO2: 95%   95%   Weight:       Height:             Physical Exam:      General: NAD  Eyes: EOMI  ENT: neck supple  Cardiovascular: Tachycardic.   Respiratory: Bilateral symmetric expansion  Gastrointestinal: Active bowel sounds.   Genitourinary: no suprapubic tenderness  Musculoskeletal: No edema  Skin: warm,  dry  Neuro: Oriented x3.  Psych: Mood appropriate.         Medications:   Medications:    fluconazole   200 mg Oral Daily    sodium chloride  flush  5-40 mL IntraVENous 2 times per day    insulin  glargine  45 Units SubCUTAneous Nightly    insulin  lispro  5 Units SubCUTAneous TID WC    ceFAZolin   2,000 mg IntraVENous q8h    ferrous sulfate   325 mg Oral Daily with breakfast    levothyroxine   25 mcg Oral Daily    losartan   25 mg Oral Daily    atorvastatin   20 mg Oral Nightly    sodium chloride  flush  5-40 mL IntraVENous 2 times per day    enoxaparin   40 mg SubCUTAneous Daily    sodium chloride  flush  5-40 mL IntraVENous 2 times per day    insulin  lispro  0-8 Units SubCUTAneous 4x Daily AC & HS      Infusions:    sodium chloride       sodium chloride       sodium chloride       dextrose        PRN Meds: sodium chloride  flush, 5-40 mL, PRN  sodium chloride , , PRN  sulfur  hexafluoride microspheres, 2 mL, ONCE PRN  oxyCODONE , 5 mg, Q6H PRN  sodium chloride  flush, 5-40 mL,  PRN  sodium chloride , , PRN  magnesium  sulfate, 2,000 mg, PRN  ondansetron , 4 mg, Q8H PRN   Or  ondansetron , 4 mg, Q6H PRN  polyethylene glycol, 17 g, Daily PRN  acetaminophen , 650 mg, Q6H PRN   Or  acetaminophen , 650 mg, Q6H PRN  sodium chloride  flush, 5-40 mL, PRN  sodium chloride , , PRN  glucose, 4 tablet, PRN  dextrose  bolus, 125 mL, PRN   Or  dextrose  bolus, 250 mL, PRN  glucagon , 1 mg, PRN  dextrose , , Continuous PRN        Labs and Imaging   XR HIP 2-3 VW W PELVIS LEFT  Result Date: 05/03/2024  XR HIP 2-3 VW W PELVIS LEFT Indication: Left hip pain. History trochanteric bursitis. COMPARISON: 03/31/2024 Findings: Frontal view the pelvis and frog-leg lateral view of the left hip were obtained. The bones are well-mineralized. There is no acute fracture or dislocation. The regional soft tissues are unremarkable.     Impression: No acute osseous abnormality. Electronically signed by Belvie Plana, MD    XR CHEST PORTABLE  Result Date: 05/03/2024  XR CHEST PORTABLE Indication: Cough COMPARISON: 03/27/2024 Findings: Portable AP upright view of the chest was obtained. The heart is normal in size and configuration. The lungs are clear. There is no effusion or pneumothorax.     Impression: No acute cardiopulmonary abnormality. Electronically signed by Belvie Plana, MD      CBC:   Recent Labs     05/08/24  0602 05/09/24  0557 05/10/24  0612   WBC 12.1* 11.9* 10.6   HGB 10.4* 9.9* 9.5*   PLT 451* 425 406     BMP:    Recent Labs     05/08/24  0602 05/09/24  0558 05/10/24  0612   NA 136 141 137   K 4.4 3.5 3.5   CL 100 102 101   CO2 24 28 28    BUN 11 14 9    CREATININE 0.7 0.8 0.6   GLUCOSE 320* 89 173*     Hepatic:   No results for input(s): AST, ALT, BILITOT, ALKPHOS in the last 72 hours.  Invalid input(s): ALB    Lipids:   Lab Results   Component Value Date/Time    CHOL 154 11/25/2023 02:44 PM    HDL 22 11/25/2023 02:44 PM    TRIG 170 11/25/2023 02:44 PM     Hemoglobin A1C:   Lab Results   Component Value  Date/Time    LABA1C 8.5 05/04/2024 10:40 AM     TSH:   Lab Results   Component Value Date/Time    TSH 1.92 08/27/2022 04:35 PM     Troponin: No results found for: TROPONINT  Lactic Acid: No results for input(s): LACTA in the last 72 hours.  BNP: No results for input(s): PROBNP in the last 72 hours.  UA:  Lab Results   Component Value Date/Time    NITRU Negative 05/07/2024 04:04 PM    COLORU Yellow 05/07/2024 04:04 PM    PHUR 6.0 05/07/2024 04:04 PM    PHUR 5.5 02/25/2017 12:22 PM    WBCUA 51-100 05/07/2024 04:04 PM    RBCUA 11-20 05/07/2024 04:04 PM    TRICHOMONAS Present 05/03/2024 04:37 PM    BACTERIA 1+ 05/07/2024 04:04 PM    CLARITYU Clear 05/07/2024 04:04 PM    LEUKOCYTESUR MODERATE 05/07/2024 04:04 PM    UROBILINOGEN 0.2 05/07/2024 04:04 PM    BILIRUBINUR Negative 05/07/2024 04:04 PM    BLOODU SMALL 05/07/2024 04:04 PM    GLUCOSEU Negative 05/07/2024 04:04 PM    KETUA Negative 05/07/2024 04:04 PM     Urine Cultures:   Lab Results   Component Value Date/Time    LABURIN No growth at 18 to 36 hours 05/07/2024 04:04 PM     Blood Cultures:   Lab Results   Component Value Date/Time    [facility]  05/05/2024 02:20 PM     No Growth to date.  Any change in status will be called.     Lab Results   Component Value Date/Time    BLOODCULT2  05/05/2024 02:20 PM     No Growth to date.  Any change in status will be called.     Organism:   Lab Results   Component Value Date/Time    ORG Staph aureus DNA Detected 05/03/2024 03:10 PM    ORG Staphylococcus aureus 05/03/2024 03:10 PM         Electronically signed by Edyth GORMAN Lash, MD on 05/10/2024 at 3:06 PM

## 2024-05-10 NOTE — Progress Notes (Addendum)
 ID Follow-up NOTE    CC:   Fever, tachycardia   Antibiotics: cefazolin     Admit Date: 05/03/2024  Hospital Day: 8    Subjective:     Patient has been afebrile, denies any worsening hip pain.  No fevers or chills.      Objective:     Patient Vitals for the past 8 hrs:   BP Temp Temp src Pulse Resp SpO2   05/10/24 0950 -- -- -- -- 17 --   05/10/24 0920 -- -- -- -- 17 --   05/10/24 0724 125/87 97.3 F (36.3 C) Oral 95 18 95 %     No intake/output data recorded.  No intake/output data recorded.    EXAM:  GENERAL: No apparent distress.    HEENT: Membranes moist, no oral lesion  NECK:  Supple, no lymphadenopathy  LUNGS: Clear b/l, no rales, no dullness  CARDIAC: RRR  ABD:  + BS, soft / NT  EXT:   Left hip dressing in place, denies any worsening pain   NEURO: No focal neurologic findings  PSYCH: Orientation, sensorium, mood normal  LINES:  Peripheral iv, PICC line has been removed.  No induration at this previous site.       Data Review:  Lab Results   Component Value Date    WBC 10.6 05/10/2024    HGB 9.5 (L) 05/10/2024    HCT 28.1 (L) 05/10/2024    MCV 79.1 (L) 05/10/2024    PLT 406 05/10/2024     Lab Results   Component Value Date    CREATININE 0.6 05/10/2024    BUN 9 05/10/2024    NA 137 05/10/2024    K 3.5 05/10/2024    CL 101 05/10/2024    CO2 28 05/10/2024       Hepatic Function Panel:   Lab Results   Component Value Date/Time    ALKPHOS 152 05/04/2024 05:07 AM    ALT <5 05/04/2024 05:07 AM    AST 10 05/04/2024 05:07 AM    BILITOT 0.6 05/04/2024 05:07 AM    BILIDIR 0.2 05/03/2024 02:17 PM    IBILI 0.4 05/03/2024 02:17 PM       MICRO:  Or culture left hip bursa 1/23: No growth to date, no organisms on Gram stain    Blood cultures x 2 1/21: No growth to date    Blood cultures x 2 1/19: 1 set positive for MSSA  Staphylococcus aureus (2)    Antibiotic Interpretation MIC  Method Status    clindamycin  Sensitive <=0.25 mcg/mL BACTERIAL SUSCEPTIBILITY PANEL BY MIC     erythromycin  Sensitive <=0.25 mcg/mL BACTERIAL  SUSCEPTIBILITY PANEL BY MIC     levofloxacin Resistant 4 mcg/mL BACTERIAL SUSCEPTIBILITY PANEL BY MIC     linezolid Sensitive 2 mcg/mL BACTERIAL SUSCEPTIBILITY PANEL BY MIC     oxacillin Sensitive 0.5 mcg/mL BACTERIAL SUSCEPTIBILITY PANEL BY MIC     tetracycline Sensitive <=1 mcg/mL BACTERIAL SUSCEPTIBILITY PANEL BY MIC     trimethoprim-sulfamethoxazole Sensitive <=10 mcg/mL BACTERIAL SUSCEPTIBILITY PANEL BY MIC     vancomycin  Sensitive 1 mcg/mL BACTERIAL SUSCEPTIBILITY PANEL BY MIC          Blood cultures x 2 12/21: Neg      OR culture left hip bursa 12/19: Rare growth Staph aureus, MSSA     Blood cultures x 2 12/16: No growth     Left hip bursa aspirate culture 12/18: Moderate growth MSSA     Blood cultures x 2 12/15: Positive for MSSA  Blood cultures x 2 12/13: Positive for MSSA  Staphylococcus aureus (2)     Antibiotic Interpretation MIC   Method Status     clindamycin  Sensitive <=0.25 mcg/mL BACTERIAL SUSCEPTIBILITY PANEL BY MIC       erythromycin  Sensitive <=0.25 mcg/mL BACTERIAL SUSCEPTIBILITY PANEL BY MIC       levofloxacin Resistant 4 mcg/mL BACTERIAL SUSCEPTIBILITY PANEL BY MIC       linezolid Sensitive 2 mcg/mL BACTERIAL SUSCEPTIBILITY PANEL BY MIC       oxacillin Sensitive 0.5 mcg/mL BACTERIAL SUSCEPTIBILITY PANEL BY MIC       tetracycline Sensitive <=1 mcg/mL BACTERIAL SUSCEPTIBILITY PANEL BY MIC         Tetracyclines have decreased renal elimination and  urinary bladder concentrations.  Use of this class of  agents for urinary tract infection should be limited to  patients that cannot tolerate an alternative therapy.           trimethoprim-sulfamethoxazole Sensitive <=10 mcg/mL BACTERIAL SUSCEPTIBILITY PANEL BY MIC       vancomycin  Sensitive 1 mcg/mL BACTERIAL SUSCEPTIBILITY PANEL BY MIC          Urine culture 12/14: 25,000 CFU MSSA      IMAGING: I have independently reviewed the images and reports.       MRI left hip with and without contrast 1/20:  1. Persistent fasciitis, bursitis, myositis and  abscess formation along the gluteus medius, the lateral upper thigh musculature fascia, and along the greater trochanteric bursa. The abscess collection has decreased in size. The extent of inflammation   appears to be mildly improved in comparison to the prior exam.     Portable chest x-ray 1/19:  No acute cardiopulmonary abnormality     X-ray left hip and pelvis 1/19:  No acute osseous abnormality.     MRI left hip with and without contrast 12/17:  1. There is a 4.6 cm left greater trochanter bursal fluid collection with thin tract which extends superiorly into the left gluteus medius muscle. There is extensive surrounding edema as well as edema within the left gluteus medius muscle, and lateral   aspect of the adjacent left gluteus maximus muscle. In the setting of infection this is most worrisome for a septic bursitis-abscess.  2. No evidence of any osteomyelitis.  3. No acute osseous abnormality.        Scheduled Meds:   sodium chloride  flush  5-40 mL IntraVENous 2 times per day    insulin  glargine  45 Units SubCUTAneous Nightly    insulin  lispro  5 Units SubCUTAneous TID WC    ceFAZolin   2,000 mg IntraVENous q8h    ferrous sulfate   325 mg Oral Daily with breakfast    levothyroxine   25 mcg Oral Daily    losartan   25 mg Oral Daily    atorvastatin   20 mg Oral Nightly    sodium chloride  flush  5-40 mL IntraVENous 2 times per day    enoxaparin   40 mg SubCUTAneous Daily    sodium chloride  flush  5-40 mL IntraVENous 2 times per day    insulin  lispro  0-8 Units SubCUTAneous 4x Daily AC & HS       Continuous Infusions:   sodium chloride       sodium chloride       sodium chloride       dextrose          PRN Meds:  sodium chloride  flush, sodium chloride , sulfur  hexafluoride microspheres, oxyCODONE , sodium chloride  flush, sodium chloride , magnesium  sulfate, ondansetron  **  OR** ondansetron , polyethylene glycol, acetaminophen  **OR** acetaminophen , sodium chloride  flush, sodium chloride , glucose, dextrose  bolus **OR** dextrose   bolus, glucagon , dextrose       Assessment:       Patient Active Problem List   Diagnosis    Mixed hyperlipidemia    Iron  deficiency anemia    Type 2 diabetes mellitus with hyperglycemia, with long-term current use of insulin  (HCC)    Essential hypertension    Acquired hypothyroidism    Mild intermittent asthma without complication    Allergic rhinitis    Heart murmur    Uterine fibroid    Class 1 obesity due to excess calories with serious comorbidity and body mass index (BMI) of 31.0 to 31.9 in adult    Abnormal uterine bleeding    Eczema    Glaucoma suspect, bilateral    Hidradenitis axillaris    HSV-2 infection    Myopic astigmatism, bilateral    Strabismus    S/P myomectomy    DKA, type 2, not at goal [facility])    Acute nonintractable headache    MSSA bacteremia    Diabetic ketoacidosis without coma associated with type 2 diabetes mellitus (HCC)    Greater trochanteric bursitis of left hip    Abscess of bursa of left hip    Sepsis (HCC)    Type 2 diabetes mellitus with other specified complication [facility])        Plan:   39 year old African-American female with history of DM2, HTN, HLD that presented on 03/27/24 with headache and shortness of breath. She was found to be in DKA and was admitted to the ICU.  During that admission she was also found to have MSSA bacteremia and left hip greater trochanteric bursitis. She is on IV abx course with daptomycin  and here with fevers and tachycardia     MSSA bacteremia and left hip greater trochanteric bursitis:  - hx of recent admission here on 03/2024 in the ICU with DKA, was also found to have MSSA bacteremia with left hip trochanteric greater bursitis  - previous MRI hip on  03/31/2024 showed 4.6cm left greater trochanteric bursal fluid collection.  This was aspirated with IR and yielded growth of MSSA.    - orthopedics performed operative washout of the bursa on 04/02/24 with operative cultures showing growth of the same.  -Patient has been on a course of IV daptomycin  now  doing this at home.  We initially had to choose a once a day antibiotic as patient was receiving charity care via [facility] and therefore was DC'd on daptomycin . This was initially planned to end on 1/16 however due to persistently elevated inflammatory markers, was extended to 1/30, and had planned OP repeat MRI hip.   -Now admitted on 1/19 with fever and tachycardia  - WBC was 12.3 and Tmax of 101.8.  ESR/CRP was 94/126 respectively.  She has been resumed on daptomycin   - Blood cultures x 2 taken on admission from 1/19 now with one set positive for MSSA.    -Patient denies any new complaints besides left hip tenderness, states slight pain on bearing weight and sometimes gives out.    - MRI left hip on this admission showing persistent fasciitis, bursitis, myositis and abscess formation along the gluteus medius, the lateral upper thigh musculature fascia, and along the greater trochanteric bursa. The abscess collection has decreased in size.   - ortho eval, Dr.Erlichmann performed washout 1/23 and pending cx.   - intra-op findings of scant purulence in the trochanteric bursa superiorly  going into the glute medius. This was bluntly opened up with a Cobb elevator throughout the glue medius ensuring all areas were opened and drained. Purulence was sent for culture.   - with positive Bcx here and isolation of MSSA, stopped dapto and placed on cefazolin  to have targeted MSSA therapy 1/21.   - persistence of bacteremia concerning for source control issue.    -With ongoing bacteremia and presence of PICC line, PICC line removed 1/21 and culture tip which remains neg to date.    - repeat sets of blood cultures remains negative to date from 1/21. Plan to hold off on new central line placement until repeat sets of blood cultures remain negative.    - TTE without any evidence of IE.  - continue cefazolin , plan picc placement on 1/26 as blood cultures remain negative will follow intraoperative cultures from the OR washout for  any further growth.  - will plan 4 week course for bacteremia with septic left hip bursitis. See COC     DM2:  - Good glycemic control to aid in healing and prevention of further infections.  - A1c on last admission was 13.9, recheck on this admission was 8.5  - Patient previously lost her insurance and was not on any diabetic medications, this has been resumed since the last admission.      INFUSION ORDERS:  - Drug: iv cefazolin  6g q24h ATC as a continuous infusion daily  - Planned End date: 06/04/2024  - Diagnosis: MSSA left hip bursitis and bacteremia   - Has received test dose in hospital  - Routine line maintenance  - Check CBC w diff, CMP, ESR, CRP every Mon or Tue - FAX result to [phone]  - Call with antibiotic / infusion issues, [phone]  - Call with any change in status, transfer in or out of a facility or to hospital; This COC is only valid for current disposition - [phone].    - No f/u in outpatient ID office necessary    Time spent in management of patient, coordination of care and discussion of OPAT, PICC care and education 55 mins.      Medical Decision Making:  The following items were considered in medical decision making:  Discussion of patient care with other providers  Reviewed clinical lab tests  Reviewed radiology tests  Reviewed other diagnostic tests/interventions  Independent review of radiologic images  Microbiology cultures and other micro tests reviewed      Please note that this chart was generated using Dragon dictation software. Although every effort was made to ensure the accuracy of this automated transcription, some errors in transcription may have occurred inadvertently.  Any pictures or media included in this note were obtained after taking informed verbal consent from the patient and with their approval to include those in the patient's medical record.     Discussed with patient, ortho PA Anice, social work and primary team, Dr. Hildegard Rhea Nichele Slawson, MD

## 2024-05-10 NOTE — Plan of Care (Signed)
 Problem: Pain  Goal: Verbalizes/displays adequate comfort level or baseline comfort level  Outcome: Progressing   Denies any pain at this time. Vital signs stable, afebrile. Left hip dressing C/D/I. Continue to monitor alteration in comfort

## 2024-05-11 LAB — POCT GLUCOSE
POC Glucose: 181 mg/dL — ABNORMAL HIGH (ref 70–99)
POC Glucose: 185 mg/dL — ABNORMAL HIGH (ref 70–99)
POC Glucose: 138 mg/dL — ABNORMAL HIGH (ref 70–99)

## 2024-05-11 MED ORDER — OXYCODONE HCL 5 MG PO TABS
5 | ORAL_TABLET | Freq: Three times a day (TID) | ORAL | 0 refills | Status: AC | PRN
Start: 2024-05-11 — End: 2024-05-16

## 2024-05-11 MED ORDER — ASPIRIN 81 MG PO TBEC
81 | ORAL_TABLET | Freq: Two times a day (BID) | ORAL | 0 refills | Status: AC
Start: 2024-05-11 — End: 2024-06-10

## 2024-05-11 MED ORDER — LANTUS SOLOSTAR 100 UNIT/ML SC SOPN
100 | Freq: Every evening | SUBCUTANEOUS | 3 refills | Status: AC
Start: 2024-05-11 — End: ?

## 2024-05-11 MED ORDER — INSULIN LISPRO (1 UNIT DIAL) 100 UNIT/ML SC SOPN
100 | Freq: Three times a day (TID) | SUBCUTANEOUS | 3 refills | Status: AC
Start: 2024-05-11 — End: ?

## 2024-05-11 MED ORDER — PANTOPRAZOLE SODIUM 20 MG PO TBEC
20 | ORAL_TABLET | Freq: Every day | ORAL | 0 refills | Status: AC
Start: 2024-05-11 — End: 2024-06-10

## 2024-05-11 MED ORDER — POLYETHYLENE GLYCOL 3350 17 G PO PACK
17 | Freq: Every day | ORAL | Status: AC | PRN
Start: 2024-05-11 — End: 2024-06-10

## 2024-05-11 MED FILL — LANTUS 100 UNIT/ML SC SOLN: 100 [IU]/mL | SUBCUTANEOUS | Qty: 45

## 2024-05-11 MED FILL — CEFAZOLIN SODIUM 1 G IJ SOLR: 1 g | INTRAMUSCULAR | Qty: 2000

## 2024-05-11 MED FILL — ATORVASTATIN CALCIUM 20 MG PO TABS: 20 mg | ORAL | Qty: 1

## 2024-05-11 MED FILL — LEVOTHYROXINE SODIUM 25 MCG PO TABS: 25 ug | ORAL | Qty: 1

## 2024-05-11 MED FILL — FLUCONAZOLE 200 MG PO TABS: 200 mg | ORAL | Qty: 1

## 2024-05-11 MED FILL — FERROUS SULFATE 325 (65 FE) MG PO TABS: 325 (65 Fe) MG | ORAL | Qty: 1

## 2024-05-11 MED FILL — ENOXAPARIN SODIUM 40 MG/0.4ML IJ SOSY: 40 MG/0.4ML | INTRAMUSCULAR | Qty: 0.4

## 2024-05-11 MED FILL — OXYCODONE HCL 5 MG PO TABS: 5 mg | ORAL | Qty: 1

## 2024-05-11 MED FILL — LOSARTAN POTASSIUM 25 MG PO TABS: 25 mg | ORAL | Qty: 1

## 2024-05-11 MED FILL — INSULIN LISPRO 100 UNIT/ML IJ SOLN: 100 [IU]/mL | INTRAMUSCULAR | Qty: 2

## 2024-05-11 NOTE — Care Coordination (Signed)
 Case Management Assessment            Discharge Note                    Date / Time of Note: 05/11/2024 12:35 PM                  Discharge Note Completed by: Barnie Bayley, RN    Patient Name: Shelly Richard   Date of Birth: 1985-08-02  Diagnosis: Sepsis (HCC) [A41.9]  Sepsis without acute organ dysfunction, due to unspecified organism [facility]) [A41.9]   Date / Time: 05/03/2024  1:19 PM    Current PCP: Shelly Bernice BRAVO, [name]  Clinic patient: No    Hospitalization in the last 30 days: Yes  Readmission Assessment  Who is being Interviewed: Patient  Number of Days since last admission?: 8-30 days  Previous Disposition: Other (comment) (infusion clinic then [facility] at home)  What was the patient's/caregiver's perception as to why they think they needed to return back to the hospital?:  (my blood pressure was up and my heart rate)  Did you visit your Primary Care Physician after you left the hospital, before you returned this time?: No  Did you see a specialist, such as Cardiac, Pulmonary, Orthopedic Physician, etc. after you left the hospital?: No  Who advised the patient to return to the hospital?: Home Health Staff  Does the patient report anything that got in the way of taking their medications?: No  What could we have done to help prevent your return to the hospital?: No opportunities identified by patient  Have You Received a Follow Up Phone Call from a HealthCare Provider After Discharge?: No    Advance Directives:  Code Status: Full Code  [place]  DNR form completed and on chart: Not Indicated    Financial:  Pay[place]: [place] BCBS / Plan: [place]BS - [place] PPO / Product Type: *No Product type* /      Pharmacy:    [facility] [id number] - [place], [place] - [name] [phone] - F [phone]  [address] [place] [address]  Phone: [phone] Fax: [phone]      Assistance purchasing medications?: Potential Assistance Obtaining Medications: No  Assistance  provided by Case Management: None at this time    Does patient want to participate in local refill/ meds to beds program?:      Meds To Beds General Rules:  1. Can ONLY be done Monday- Friday between 8:30am-5pm  2. Prescription(s) must be in pharmacy by 3pm to be filled same day  3.Copy of patient's insurance/ prescription drug card and patient face sheet must be sent along with the prescription(s)  4. Cost of Rx cannot be added to hospital bill. If financial assistance is needed, please contact unit case manager or child psychotherapist; Case manager or Social Worker CANNOT provide pharmacy voucher for patients co-pays  5. Patients can then pick up the prescription on their way out of the hospital at discharge, or pharmacy can deliver to the bedside if staff is available. (payment due at time of pick-up or delivery - cash, check, or card accepted)     Able to afford home medications/ co-pay costs: Yes    ADLS:  Current PT AM-PAC Score:   /24  Current OT AM-PAC Score:   /24    DISCHARGE Disposition: Home with Home Health Care: [facility]: [facility]  Phone: [facility] [phone] Fax: [phone]  [facility] [phone]    LOC at discharge: Not Applicable  COC Completed: Yes    Notification completed in HENS/PAS?:  Not Applicable    IMM Completed:   Not Indicated due to: medicaid         Transportation:  Transportation PLAN for discharge: family   Mode of Transport: Hydrographic Surveyor    Home Care:  Home Care ordered at discharge: Yes  Home Care Agency: [facility]  Phone: [phone]  Fax: [phone]  Orders faxed: Yes      Additional CM Notes: Patient home with husband at discharge- transport today by husband.  Called [facility] with IV orders. Called [facility] rep (Ty) and she verified delivery today to home. Called home care- [facility] and they will be at her home tomorrow. Electronically signed by Barnie Bayley, RN on 05/11/2024 at 12:42 PM       COVID Result:     Lab Results   Component Value Date/Time    COVID19 NOT DETECTED 05/03/2024 03:21 PM    COVID19 DETECTED 04/22/2019 02:15 PM       The Plan for Transition of Care is related to the following treatment goals of Sepsis (HCC) [A41.9]  Sepsis without acute organ dysfunction, due to unspecified organism [facility]) [A41.9]    The Patient and/or patient representative [name] and her family were provided with a choice of provider and agrees with the discharge plan Yes    Freedom of choice list was provided with basic dialogue that supports the patient's individualized plan of care/goals and shares the quality data associated with the providers. Yes    Care Transitions patient: No    Barnie Bayley, RN  [facility]  Case Management Department  Ph: [phone]

## 2024-05-11 NOTE — Progress Notes (Incomplete)
 I have seen, examined and evaluated the patient as did the resident physician, Dr. PIERRETTE on 05/11/24. We have discussed the plan of care and decisions made during that discussion were incorporated into this note. I have reviewed the resident physician's note and agree with the assessment and plan of care as documented.    39 year old African-American female with history of DM2, HTN, HLD  - Admit 12/12 - 12/24 -- for DKA with sepsis, revealed MSSA bacteremia; MRI confirmed possible trochanteric bursal infection with 4.6cm left greater trochanteric bursal fluid collection, aspirated with IR and yielded growth of MSSA.  Underwent I&D with ortho of abscess on 12/19 which  reveal MSSA as well.  Was treated with IV antibiotics with daptomycin  which was initially planned through 01/16 but due to persistently elevated inflammatory markers, was extended to 1/30. Meds to Beds was done for insulin  as well.     - Admit 01/19 -- ever and tachycardia  - WBC was 12.3 and Tmax of 101.8.  ESR/CRP was 94/126 respectively. Blood cultures x 2 taken on admission from 1/19 now with one set positive for MSSA.  MRI left hip on this admission showing persistent fasciitis, bursitis, myositis and abscess formation along the gluteus medius, the lateral upper thigh musculature fascia, and along the greater trochanteric bursa. The abscess collection has decreased in size.  Orthopedic surgery Dr. Melodie performed washout 1/23.  Placed on cefazolin  to have targeted MSSA therapy 1/21  With ongoing bacteremia and presence of PICC line, PICC line removed 1/21 and culture tip which remains neg to date.    - repeat sets of blood cultures remains negative to date from 1/21. Plan to hold off on new central line placement until repeat sets of blood cultures remain negative.    - no vegetation noted on TTE    Orthopedic surgery following -- Activity as tolerated, Glucose control, Abx and follow up with ortho outpatient    ID closely following -- picc  placement on 1/26 as blood cultures remain negative will follow intraoperative cultures from the OR washout for any further growth.  - will plan 4 week course for bacteremia with septic left hip bursitis    INFUSION ORDERS:  - Drug: iv cefazolin  6g q24h ATC as a continuous infusion daily  - Planned End date: 06/04/2024  - Diagnosis: MSSA left hip bursitis and bacteremia   - Has received test dose in hospital  - Routine line maintenance  - Check CBC w diff, CMP, ESR, CRP every Mon or Tue - FAX result to [phone]  - Call with antibiotic / infusion issues, [phone]  - Call with any change in status, transfer in or out of a facility or to hospital; This COC is only valid for current disposition - [phone].    - No f/u in outpatient ID office necessary    Hypothyroidism  - Continue Synthroid .     Uncontrolled diabetes mellitus 13.9% last A1c  - -improved today blood sugars in the 120s.   -  meds adjusted     Hypertension  - Continue losartan      Microcytic anemia  - Continue iron  supplementation.     Vaginal discharge  -Send for UA.       I personally have obtained, updated and/or reviewed the patients medication list on @TODAY @.  Personally reviewed Lab Studies and Imaging   Discussed management of the case with *** who recommended ***  EKG interpreted personally and results ***  Imaging that was interpreted personally includes ***  and results ***  Drugs that require monitoring for toxicity include *** and the method of monitoring was ***    Comment: Please note this report has been produced using speech recognition software and may contain errors related to that system including errors in grammar, punctuation, and spelling, as well as words and phrases that may be inappropriate. If there are any questions or concerns, please feel free to contact the dictating provider for clarification.     @[email or web address] @      Karlene VEAR North, MD, [facility], [facility]  Hospitalist  Attending Physician

## 2024-05-11 NOTE — Plan of Care (Signed)
 D; Was up sitting in chair for few hrs this evening without any c/o pain. Afebrile overnight. Looking forward to hopefully going home tomorrow.   A: Cont to monitor during hourly rounds    Problem: Chronic Conditions and Co-morbidities  Goal: Patient's chronic conditions and co-morbidity symptoms are monitored and maintained or improved  05/11/2024 0135 by Hobart Males, RN  Outcome: Progressing     Problem: Pain  Goal: Verbalizes/displays adequate comfort level or baseline comfort level  05/11/2024 0135 by Hobart Males, RN  Outcome: Progressing     Problem: Safety - Adult  Goal: Free from fall injury  05/11/2024 0135 by Hobart Males, RN  Outcome: Progressing     Problem: Discharge Planning  Goal: Discharge to home or other facility with appropriate resources  Outcome: Progressing

## 2024-05-11 NOTE — Progress Notes (Signed)
 D/C order noted. Tele removed. Pt PICC line covered with net guaze and pt given supplies for protection of PICC and left hip dressing. Pt verbalized understanding of new medications and follow up with ortho surgeon. Transported off unit per wheelchair with all personal belongings.

## 2024-05-11 NOTE — Discharge Summary (Cosign Needed)
 INTERNAL MEDICINE DEPARTMENT AT THE [facility]  DISCHARGE SUMMARY    Patient ID: Shelly Richard                                             Discharge Date: 05/11/2024   Patient's PCP: Shelly Bernice BRAVO, [name]                                          Discharge Physician: Karlene VEAR North, MD  Admit Date: 05/03/2024   Admitting Physician: Sheree CHRISTELLA Shelly Marylyn, MD    DISCHARGE DIAGNOSES:  - MSSA bacteremia  - Suspected sepsis 2/2 L hip greater trochanteric bursitis  - Fasciitis, bursitis, myositis, abscess along gluteus medius, s/p I& D  - DM2  -Dyslipidemia  - Hypertension  -Microcytic anemia with elevated RDW    Hospital Course:      Shelly Richard is a 39 y.o. female h/o HTN, HLD, chronic microcytic anemia, DM2, recent hospitalization for DKA, MSSA bacteremia, who presented after being advised by [facility] nurse due to persistent tachycardia and was noted to have fever with T 100.2.  She was admitted for further evaluation and management.  She had a recent admission from 05/28/2023 to 06/09/2023 for DKA, sepsis; was noted to have MSSA bacteremia and L septic trochanteric bursitis.  Culture from septic joint aspirate from 04/01/2024 had moderate MSSA growth.  2 sets of blood cultures from 05/29/2023 and 2 sets of blood cultures from 05/31/2023 were positive for MSSA. She had an incision and drainage on 04/02/2024 and was treated with antibiotics.   She was discharged with home health care with plan to complete daptomycin  course through 04/30/2024.    On this presentation patient was noted to be tachycardic, febrile with leukocytosis and elevated inflammatory markers.  She was evaluated by infectious disease and orthopedic surgery.  1 set of blood culture from 05/03/2024 was positive for MSSA (resistant to levofloxacin).    MRI left hip on 05/04/2024 showed persistent fasciitis, bursitis, myositis, and abscess formation along the gluteus medius, lateral upper thigh musculature fascia, and along  the greater trochanteric bursa.  TTE did not have any evidence of IE. She was again treated with antibiotics.  2 sets of blood cultures from 05/05/2024 did not have any growth.  Culture and Gram stain from L troch bursa were unremarkable.  She got a PICC line on 05/10/2024 with plans to receive IV cefazolin  infusion 6 g every 24 hours ATC through 06/04/2024.  Inflammatory markers downtrended during this time.  Prior to discharge she was afebrile, without tachycardia.  Last CBC on 05/10/2024 showed resolution of leukocytosis.  For her diabetes mellitus type 2, her insulin  was adjusted for better glucose management during this admission.  Hemoglobin A1c during this admission was 8.5.  Her Lantus  was increased to 45 units nightly.  Her short acting insulin  was changed to 5 units 3 times daily with meals, plus sliding scale.    She received ferrous sulfate  for her microcytic anemia and her losartan  25 mg daily for hypertension and levothyroxine  25 mcg daily were continued.  She was stable prior to discharge and reported improvement.  On the day of discharge her physical exam showed the following    Vitals:  BP 122/77  Pulse 95   Temp 98.4 F (36.9 C) (Oral)   Resp 16   Ht 1.626 m (5' 4)   Wt 75.3 kg (166 lb)   SpO2 97%   BMI 28.49 kg/m   General appearance: alert, appears stated age and cooperative  Head: Normocephalic, without obvious abnormality, atraumatic  Eyes: conjunctivae/corneas clear. PERRL, EOM's intact.  Ears: normal TM's and external ear canals both ears  Nose: Nares normal. Septum midline. Mucosa normal. No drainage or sinus tenderness.  Throat: lips, mucosa, and tongue normal; teeth and gums normal  Neck: no adenopathy, no carotid bruit, no JVD, supple, symmetrical, trachea midline and thyroid not enlarged, symmetric, no tenderness/mass/nodules  Lungs: clear to auscultation bilaterally  Heart: regular rate and rhythm, S1, S2 normal  Abdomen: soft, non-tender; bowel sounds normal; no masses,  no  organomegaly  MSK/skin L hip incision site without bleeding or any signs of infection.  Clean dressing.  Stitches intact  Extremities: extremities normal, no cyanosis   Neurologic: Grossly normal    Consults:  IP CONSULT TO INFECTIOUS DISEASES  IP CONSULT TO ORTHOPEDIC SURGERY  IP CONSULT TO VASCULAR ACCESS TEAM  IP CONSULT TO HOME CARE NEEDS    Disposition: home  Discharged Condition: Stable  Follow Up: Primary Care Physician in one week    DISCHARGE MEDICATION:     Medication List        START taking these medications      oxyCODONE  5 MG immediate release tablet  Commonly known as: ROXICODONE   Take 1 tablet by mouth every 8 hours as needed for Pain for up to 5 days. Max Daily Amount: 15 mg     pantoprazole  20 MG tablet  Commonly known as: PROTONIX   Take 1 tablet by mouth every morning (before breakfast)     polyethylene glycol 17 g packet  Commonly known as: GLYCOLAX   Take 1 packet by mouth daily as needed for Constipation            CHANGE how you take these medications      insulin  lispro (1 Unit Dial ) 100 UNIT/ML Sopn  Commonly known as: HumaLOG  KwikPen  Inject 5 Units into the skin 3 times daily (before meals) INJECT 5 UNITS UNDER THE SKIN three times with meals PLUS sliding scale -  2 UNITS FOR BLOOD SUGAR 150-200, 4 UNITS IF 201-250, 6 UNITS IF 251-300, 8 UNITS IF 301-350, 10 UNITS IF GREATER THAN 350.  What changed:   how much to take  additional instructions     Lantus  SoloStar 100 UNIT/ML injection pen  Generic drug: insulin  glargine  Inject 45 Units into the skin nightly  What changed: how much to take            CONTINUE taking these medications      acetaminophen  500 MG tablet  Commonly known as: TYLENOL   Take 1 tablet by mouth 4 times daily as needed for Pain     [employer] Blood Glucose Meter w/Device Kit  Please provide meter approved by insurance.  Check bs qam     albuterol  sulfate HFA 108 (90 Base) MCG/ACT inhaler  Commonly known as: PROVENTIL ;VENTOLIN ;PROAIR   Inhale 2 puffs into the lungs every 6  hours as needed for Wheezing or Shortness of Breath     aspirin  81 MG EC tablet  Take 1 tablet by mouth in the morning and at bedtime     blood glucose test strips  Tes2t qam  times a day & as needed for symptoms of  irregular blood glucose. Please provide test strips approved by insurance.     cetirizine  10 MG tablet  Commonly known as: ZYRTEC   Take 1 tablet by mouth daily     ferrous sulfate  325 (65 Fe) MG tablet  Commonly known as: FeroSul  Take 1 tablet by mouth daily (with breakfast)     FreeStyle Libre 3 Plus Sensor Misc  Apply every 15 days     ibuprofen  600 MG tablet  Commonly known as: ADVIL ;MOTRIN   Take 1 tablet by mouth every 6 hours as needed for Pain     * [employer] Pen Needles 31G 31G X 8 MM Misc  Generic drug: Insulin  Pen Needle  1 each by Does not apply route daily     * Insulin  Pen Needle 32G X 6 MM Misc  Use four times daily and as needed     * Droplet Pen Needles 32G X 6 MM Misc  Generic drug: Insulin  Pen Needle  USE ONCE DAILY     Lancets Misc  1 each by Does not apply route daily Please provide lancets approved by insurance.     levothyroxine  25 MCG tablet  Commonly known as: SYNTHROID   Take 1 tablet by mouth Daily     losartan  25 MG tablet  Commonly known as: COZAAR   TAKE ONE TABLET BY MOUTH DAILY     metFORMIN  500 MG extended release tablet  Commonly known as: GLUCOPHAGE -XR  Take 4 tablets by mouth daily (with breakfast)     montelukast  10 MG tablet  Commonly known as: SINGULAIR   Take 1 tablet by mouth daily     Ozempic  (1 MG/DOSE) 4 MG/3ML Sopn sc injection  Generic drug: Semaglutide  (1 MG/DOSE)  Inject 1 mg into the skin every 7 days     simvastatin  40 MG tablet  Commonly known as: ZOCOR   Take 1 tablet by mouth daily     Vitamin D3 50 MCG (2000 UT) Tabs  TAKE ONE TABLET BY MOUTH DAILY           * This list has 3 medication(s) that are the same as other medications prescribed for you. Read the directions carefully, and ask your doctor or other care provider to review them with you.                 STOP taking these medications      DAPTOmycin  infusion  Commonly known as: CUBICIN      fluconazole  150 MG tablet  Commonly known as: DIFLUCAN             ASK your doctor about these medications      EPINEPHrine  0.3 MG/0.3ML Soaj injection  Commonly known as: EPIPEN                Where to Get Your Medications        You can get these medications from any pharmacy    Bring a paper prescription for each of these medications  oxyCODONE  5 MG immediate release tablet  pantoprazole  20 MG tablet  You don't need a prescription for these medications  polyethylene glycol 17 g packet       Information about where to get these medications is not yet available    Ask your nurse or doctor about these medications  aspirin  81 MG EC tablet  insulin  lispro (1 Unit Dial ) 100 UNIT/ML Sopn  Lantus  SoloStar 100 UNIT/ML injection pen       Activity: As directed by therapy  Diet: regular diet  Wound  Care: as directed    Time Spent on discharge is more than      Signed:  Lowery Albe, MD,    05/11/2024

## 2024-05-11 NOTE — Progress Notes (Incomplete)
 Progress Note    Admit Date: 05/03/2024  Day: 9  Diet: ADULT DIET; Regular    CC: Fever and tachycardia    Interval history:   Patient seen and examined at bedside this morning    Afebrile   Mostly tachycardic  Hemodynamically stable    BG 185  Hg 9.5, stable        Medications:     Scheduled Meds:   fluconazole   200 mg Oral Daily    sodium chloride  flush  5-40 mL IntraVENous 2 times per day    insulin  glargine  45 Units SubCUTAneous Nightly    insulin  lispro  5 Units SubCUTAneous TID WC    ceFAZolin   2,000 mg IntraVENous q8h    ferrous sulfate   325 mg Oral Daily with breakfast    levothyroxine   25 mcg Oral Daily    losartan   25 mg Oral Daily    atorvastatin   20 mg Oral Nightly    enoxaparin   40 mg SubCUTAneous Daily    insulin  lispro  0-8 Units SubCUTAneous 4x Daily AC & HS     Continuous Infusions:   sodium chloride       sodium chloride       sodium chloride       dextrose        PRN Meds:sodium chloride  flush, sodium chloride , sulfur  hexafluoride microspheres, oxyCODONE , sodium chloride  flush, sodium chloride , magnesium  sulfate, ondansetron  **OR** ondansetron , polyethylene glycol, acetaminophen  **OR** acetaminophen , sodium chloride  flush, sodium chloride , glucose, dextrose  bolus **OR** dextrose  bolus, glucagon , dextrose     Objective:   Vitals:   T-max:  Patient Vitals for the past 8 hrs:   BP Temp Temp src Pulse Resp SpO2   05/11/24 0615 116/72 97.9 F (36.6 C) Oral 92 16 99 %   05/11/24 0314 -- -- -- -- 16 --   05/11/24 0244 -- -- -- -- 16 --   05/11/24 0125 (!) 143/79 98.4 F (36.9 C) Oral 96 20 100 %       Intake/Output Summary (Last 24 hours) at 05/11/2024 0903  Last data filed at 05/11/2024 0124  Gross per 24 hour   Intake 720 ml   Output --   Net 720 ml       Review of Systems  Per HPI  Physical Exam    LABS:    CBC:   Recent Labs     05/09/24  0557 05/10/24  0612   WBC 11.9* 10.6   HGB 9.9* 9.5*   HCT 29.8* 28.1*   PLT 425 406   MCV 81.4 79.1*     Renal:    Recent Labs     05/09/24  0558 05/10/24  0612   NA  141 137   K 3.5 3.5   CL 102 101   CO2 28 28   BUN 14 9   CREATININE 0.8 0.6   GLUCOSE 89 173*   CALCIUM  9.4 9.0   ANIONGAP 11 8     Hepatic: No results for input(s): AST, ALT, BILITOT, BILIDIR, LABALBU, ALKPHOS in the last 72 hours.    Invalid input(s): PROT  Troponin: No results for input(s): TROPONINI in the last 72 hours.  BNP: No results for input(s): BNP in the last 72 hours.  Lipids: No results for input(s): CHOL, HDL in the last 72 hours.    Invalid input(s): LDLCALCU, TRIGLYCERIDE  ABGs:  No results for input(s): PHART, PCO2ART, PO2ART, HCO3ART, BEART, THGBART, O2SATART, TCO2ART in the last 72 hours.    INR: No results  for input(s): INR in the last 72 hours.  Lactate: No results for input(s): LACTATE in the last 72 hours.  Cultures:  -----------------------------------------------------------------  RAD:   MRI THORACIC SPINE W WO CONTRAST   Final Result   *  Bone and soft tissue edema of the C1-2 articulation most pronounced at the left lateral articulation, nonspecific, may be degenerative/inflammatory however septic arthritis can have similar appearance and can be considered in the appropriate clinical    context. No epidural phlegmon or disc involvement.   *  Thoracic spine unremarkable.   *  L5-S1 disc extrusion with possible impingement of the descending left S1 nerve roots.   *  No high-grade stenosis or cord compression.      Electronically signed by Isom Repress      MRI CERVICAL SPINE W WO CONTRAST   Final Result   *  Bone and soft tissue edema of the C1-2 articulation most pronounced at the left lateral articulation, nonspecific, may be degenerative/inflammatory however septic arthritis can have similar appearance and can be considered in the appropriate clinical    context. No epidural phlegmon or disc involvement.   *  Thoracic spine unremarkable.   *  L5-S1 disc extrusion with possible impingement of the descending left S1 nerve roots.   *  No  high-grade stenosis or cord compression.      Electronically signed by Isom Repress      MRI LUMBAR SPINE W WO CONTRAST   Final Result   *  Bone and soft tissue edema of the C1-2 articulation most pronounced at the left lateral articulation, nonspecific, may be degenerative/inflammatory however septic arthritis can have similar appearance and can be considered in the appropriate clinical    context. No epidural phlegmon or disc involvement.   *  Thoracic spine unremarkable.   *  L5-S1 disc extrusion with possible impingement of the descending left S1 nerve roots.   *  No high-grade stenosis or cord compression.      Electronically signed by Isom Repress      MRI HIP LEFT W WO CONTRAST   Final Result   1. Persistent fasciitis, bursitis, myositis and abscess formation along the gluteus medius, the lateral upper thigh musculature fascia, and along the greater trochanteric bursa. The abscess collection has decreased in size. The extent of inflammation    appears to be mildly improved in comparison to the prior exam.      Electronically signed by Lamar Gravely, MD      XR HIP 2-3 VW W PELVIS LEFT   Final Result   Impression: No acute osseous abnormality.      Electronically signed by Belvie Plana, MD      XR CHEST PORTABLE   Final Result   Impression: No acute cardiopulmonary abnormality.      Electronically signed by Belvie Plana, MD          Assessment/Plan:       Reported fever  Tachycardia  MSSA bacteremia  Fasciitis, bursitis, myositis, abscess along gluteus medius   Suspected sepsis 2/2L hip bursitis  L hip greater trochanteric bursitis s/p I&D, s/p daptomycin   04/01/2024 L hip bursa aspirate culture: MSSA moderate growth  03/27/2024 blood culture x 2 positive for MSSA  03/29/2024 blood culture x 2 positive for MSSA  05/03/2024 blood culture x 1 positive for MSSA, resistant to levofloxacin  05/05/2024 blood cultures x 2 NGTD  L hip bursa cx NGTD, no organism on Gram stain  05/04/2024 MRI L hip:  Persistent  fasciitis, bursitis, myositis and abscess formation along the gluteus medius, the lateral upper thigh musculature fascia, and along the greater trochanteric bursa   TTE without evidence of IE  05/10/2024 PICC placed  CRP now downtrending 126-> 43.9  -Orthopedic surgery on board, will appreciate recs  - ID on board, will appreciate recs  -s/p  cefazolin  2 g every 8 hours  -Plan for IV cefazolin  6 g every 24 hours ATC through 06/04/2024    Multilevel DDD  Bone and soft tissue edema on MRI of spine    DM2  History of uncontrolled DM  Recent hospitalization for DKA  A1c 8.5, improved from 13.9  -Lantus  45 units nightly  -Lispro 5 units 3 times daily with meals  - MD SSI  - Hypoglycemia treatment protocol  - POC glucose    Dyslipidemia  11/25/2023 TC 154, TG 170, HDL 22, LDL 98  -Lipitor  20 mg nightly    HTN  -Losartan  25 mg daily    Microcytic anemia with elevated RDW  Hg 9.5, MCV 79.1  -Ferrous sulfate  325 daily    Hypomagnesemia  Mg 1.56  -Monitor and replace    Code Status: Full code  FEN: Carb controlled adult regular diet  PPX: Lovenox   DISPO: From home with reported fever, noted to be tachycardic, with abnormal MRI, admitted for management of suspected sepsis 2/2L hip infection, post discharge TBD    Lowery Albe, MD, PGY-3  05/11/24  9:03 AM    This patient has been staffed and discussed with Liliane Karlene DEL, MD.

## 2024-05-11 NOTE — Progress Notes (Signed)
 ID Follow-up NOTE    CC:   Fever, tachycardia   Antibiotics: cefazolin     Admit Date: 05/03/2024  Hospital Day: 9    Subjective:     Patient has been afebrile, denies any worsening hip pain.  No fevers or chills. Picc in place.       Objective:     Patient Vitals for the past 8 hrs:   BP Temp Temp src Pulse Resp SpO2   05/11/24 0948 122/77 98.4 F (36.9 C) Oral 95 16 97 %     I/O last 3 completed shifts:  In: 720 [P.O.:720]  Out: -   No intake/output data recorded.    EXAM:  GENERAL: No apparent distress.    HEENT: Membranes moist, no oral lesion  NECK:  Supple, no lymphadenopathy  LUNGS: Clear b/l, no rales, no dullness  CARDIAC: RRR  ABD:  + BS, soft / NT  EXT:   Left hip dressing in place, denies any worsening pain   NEURO: No focal neurologic findings  PSYCH: Orientation, sensorium, mood normal  LINES:  Peripheral iv, PICC line has been removed.  No induration at this previous site.       Data Review:  Lab Results   Component Value Date    WBC 10.6 05/10/2024    HGB 9.5 (L) 05/10/2024    HCT 28.1 (L) 05/10/2024    MCV 79.1 (L) 05/10/2024    PLT 406 05/10/2024     Lab Results   Component Value Date    CREATININE 0.6 05/10/2024    BUN 9 05/10/2024    NA 137 05/10/2024    K 3.5 05/10/2024    CL 101 05/10/2024    CO2 28 05/10/2024       Hepatic Function Panel:   Lab Results   Component Value Date/Time    ALKPHOS 152 05/04/2024 05:07 AM    ALT <5 05/04/2024 05:07 AM    AST 10 05/04/2024 05:07 AM    BILITOT 0.6 05/04/2024 05:07 AM    BILIDIR 0.2 05/03/2024 02:17 PM    IBILI 0.4 05/03/2024 02:17 PM       MICRO:  OR culture left hip bursa 1/23: No growth to date, no organisms on Gram stain    Blood cultures x 2 1/21: No growth to date    Blood cultures x 2 1/19: 1 set positive for MSSA  Staphylococcus aureus (2)    Antibiotic Interpretation MIC  Method Status    clindamycin  Sensitive <=0.25 mcg/mL BACTERIAL SUSCEPTIBILITY PANEL BY MIC     erythromycin  Sensitive <=0.25 mcg/mL BACTERIAL SUSCEPTIBILITY PANEL BY MIC      levofloxacin Resistant 4 mcg/mL BACTERIAL SUSCEPTIBILITY PANEL BY MIC     linezolid Sensitive 2 mcg/mL BACTERIAL SUSCEPTIBILITY PANEL BY MIC     oxacillin Sensitive 0.5 mcg/mL BACTERIAL SUSCEPTIBILITY PANEL BY MIC     tetracycline Sensitive <=1 mcg/mL BACTERIAL SUSCEPTIBILITY PANEL BY MIC     trimethoprim-sulfamethoxazole Sensitive <=10 mcg/mL BACTERIAL SUSCEPTIBILITY PANEL BY MIC     vancomycin  Sensitive 1 mcg/mL BACTERIAL SUSCEPTIBILITY PANEL BY MIC          Blood cultures x 2 12/21: Neg      OR culture left hip bursa 12/19: Rare growth Staph aureus, MSSA     Blood cultures x 2 12/16: No growth     Left hip bursa aspirate culture 12/18: Moderate growth MSSA     Blood cultures x 2 12/15: Positive for MSSA     Blood cultures x  2 12/13: Positive for MSSA  Staphylococcus aureus (2)     Antibiotic Interpretation MIC   Method Status     clindamycin  Sensitive <=0.25 mcg/mL BACTERIAL SUSCEPTIBILITY PANEL BY MIC       erythromycin  Sensitive <=0.25 mcg/mL BACTERIAL SUSCEPTIBILITY PANEL BY MIC       levofloxacin Resistant 4 mcg/mL BACTERIAL SUSCEPTIBILITY PANEL BY MIC       linezolid Sensitive 2 mcg/mL BACTERIAL SUSCEPTIBILITY PANEL BY MIC       oxacillin Sensitive 0.5 mcg/mL BACTERIAL SUSCEPTIBILITY PANEL BY MIC       tetracycline Sensitive <=1 mcg/mL BACTERIAL SUSCEPTIBILITY PANEL BY MIC         Tetracyclines have decreased renal elimination and  urinary bladder concentrations.  Use of this class of  agents for urinary tract infection should be limited to  patients that cannot tolerate an alternative therapy.           trimethoprim-sulfamethoxazole Sensitive <=10 mcg/mL BACTERIAL SUSCEPTIBILITY PANEL BY MIC       vancomycin  Sensitive 1 mcg/mL BACTERIAL SUSCEPTIBILITY PANEL BY MIC          Urine culture 12/14: 25,000 CFU MSSA      IMAGING: I have independently reviewed the images and reports.       MRI left hip with and without contrast 1/20:  1. Persistent fasciitis, bursitis, myositis and abscess formation along the  gluteus medius, the lateral upper thigh musculature fascia, and along the greater trochanteric bursa. The abscess collection has decreased in size. The extent of inflammation   appears to be mildly improved in comparison to the prior exam.     Portable chest x-ray 1/19:  No acute cardiopulmonary abnormality     X-ray left hip and pelvis 1/19:  No acute osseous abnormality.     MRI left hip with and without contrast 12/17:  1. There is a 4.6 cm left greater trochanter bursal fluid collection with thin tract which extends superiorly into the left gluteus medius muscle. There is extensive surrounding edema as well as edema within the left gluteus medius muscle, and lateral   aspect of the adjacent left gluteus maximus muscle. In the setting of infection this is most worrisome for a septic bursitis-abscess.  2. No evidence of any osteomyelitis.  3. No acute osseous abnormality.        Scheduled Meds:   sodium chloride  flush  5-40 mL IntraVENous 2 times per day    insulin  glargine  45 Units SubCUTAneous Nightly    insulin  lispro  5 Units SubCUTAneous TID WC    ceFAZolin   2,000 mg IntraVENous q8h    ferrous sulfate   325 mg Oral Daily with breakfast    levothyroxine   25 mcg Oral Daily    losartan   25 mg Oral Daily    atorvastatin   20 mg Oral Nightly    enoxaparin   40 mg SubCUTAneous Daily    insulin  lispro  0-8 Units SubCUTAneous 4x Daily AC & HS       Continuous Infusions:   sodium chloride       sodium chloride       sodium chloride       dextrose          PRN Meds:  sodium chloride  flush, sodium chloride , sulfur  hexafluoride microspheres, oxyCODONE , sodium chloride  flush, sodium chloride , magnesium  sulfate, ondansetron  **OR** ondansetron , polyethylene glycol, acetaminophen  **OR** acetaminophen , sodium chloride  flush, sodium chloride , glucose, dextrose  bolus **OR** dextrose  bolus, glucagon , dextrose       Assessment:  Patient Active Problem List   Diagnosis    Mixed hyperlipidemia    Iron  deficiency anemia    Type 2  diabetes mellitus with hyperglycemia, with long-term current use of insulin  (HCC)    Essential hypertension    Acquired hypothyroidism    Mild intermittent asthma without complication    Allergic rhinitis    Heart murmur    Uterine fibroid    Class 1 obesity due to excess calories with serious comorbidity and body mass index (BMI) of 31.0 to 31.9 in adult    Abnormal uterine bleeding    Eczema    Glaucoma suspect, bilateral    Hidradenitis axillaris    HSV-2 infection    Myopic astigmatism, bilateral    Strabismus    S/P myomectomy    DKA, type 2, not at goal [facility])    Acute nonintractable headache    MSSA bacteremia    Diabetic ketoacidosis without coma associated with type 2 diabetes mellitus (HCC)    Greater trochanteric bursitis of left hip    Abscess of bursa of left hip    Sepsis (HCC)    Type 2 diabetes mellitus with other specified complication [facility])        Plan:   39 year old African-American female with history of DM2, HTN, HLD that presented on 03/27/24 with headache and shortness of breath. She was found to be in DKA and was admitted to the ICU.  During that admission she was also found to have MSSA bacteremia and left hip greater trochanteric bursitis. She is on IV abx course with daptomycin  and here with fevers and tachycardia     MSSA bacteremia and left hip greater trochanteric bursitis:  - hx of recent admission here on 03/2024 in the ICU with DKA, was also found to have MSSA bacteremia with left hip trochanteric greater bursitis  - previous MRI hip on  03/31/2024 showed 4.6cm left greater trochanteric bursal fluid collection.  This was aspirated with IR and yielded growth of MSSA.    - orthopedics performed operative washout of the bursa on 04/02/24 with operative cultures showing growth of the same.  -Patient has been on a course of IV daptomycin  now doing this at home.  We initially had to choose a once a day antibiotic as patient was receiving charity care via [facility] and therefore was DC'd on  daptomycin . This was initially planned to end on 1/16 however due to persistently elevated inflammatory markers, was extended to 1/30, and had planned OP repeat MRI hip.   -Now admitted on 1/19 with fever and tachycardia  - WBC was 12.3 and Tmax of 101.8.  ESR/CRP was 94/126 respectively.  She has been resumed on daptomycin   - Blood cultures x 2 taken on admission from 1/19 now with one set positive for MSSA.    -Patient denies any new complaints besides left hip tenderness, states slight pain on bearing weight and sometimes gives out.    - MRI left hip on this admission showing persistent fasciitis, bursitis, myositis and abscess formation along the gluteus medius, the lateral upper thigh musculature fascia, and along the greater trochanteric bursa. The abscess collection has decreased in size.   - ortho eval, Dr.Erlichmann performed washout 1/23 and pending cx.   - intra-op findings of scant purulence in the trochanteric bursa superiorly going into the glute medius. This was bluntly opened up with a Cobb elevator throughout the glue medius ensuring all areas were opened and drained. Purulence was sent for culture.   -  with positive Bcx here and isolation of MSSA, stopped dapto and placed on cefazolin  to have targeted MSSA therapy 1/21.   - persistence of bacteremia concerning for source control issue.    -With ongoing bacteremia and presence of PICC line, PICC line removed 1/21 and culture tip which remains neg to date.    - repeat sets of blood cultures remains negative to date from 1/21. Plan to hold off on new central line placement until repeat sets of blood cultures remain negative.    - TTE without any evidence of IE.  - continue cefazolin , plan picc placement on 1/26 as blood cultures remain negative will follow intraoperative cultures from the OR washout for any further growth.  - will plan 4 week course for bacteremia with septic left hip bursitis. See COC. She states preference for continuous  infusion at this time.     DM2:  - Good glycemic control to aid in healing and prevention of further infections.  - A1c on last admission was 13.9, recheck on this admission was 8.5  - Patient previously lost her insurance and was not on any diabetic medications, this has been resumed since the last admission.        INFUSION ORDERS:  - Drug: iv cefazolin  6g q24h ATC as a continuous infusion daily  - Planned End date: 06/04/2024  - Diagnosis: MSSA left hip bursitis and bacteremia   - Has received test dose in hospital  - Routine line maintenance  - Check CBC w diff, CMP, ESR, CRP every Mon or Tue - FAX result to [phone]  - Call with antibiotic / infusion issues, [phone]  - Call with any change in status, transfer in or out of a facility or to hospital; This COC is only valid for current disposition - [phone].    - No f/u in outpatient ID office necessary        Medical Decision Making:  The following items were considered in medical decision making:  Discussion of patient care with other providers  Reviewed clinical lab tests  Reviewed radiology tests  Reviewed other diagnostic tests/interventions  Independent review of radiologic images  Microbiology cultures and other micro tests reviewed      Please note that this chart was generated using Dragon dictation software. Although every effort was made to ensure the accuracy of this automated transcription, some errors in transcription may have occurred inadvertently.  Any pictures or media included in this note were obtained after taking informed verbal consent from the patient and with their approval to include those in the patient's medical record.     Discussed with patient, ortho PA Anice, social work and primary team, Dr. Liliane resident team.   Redmond Nature Vogelsang, MD

## 2024-05-12 MED ORDER — FLUCONAZOLE 150 MG PO TABS
150 | ORAL_TABLET | Freq: Once | ORAL | 0 refills | Status: AC
Start: 2024-05-12 — End: 2024-05-12

## 2024-05-12 MED ORDER — CEFAZOLIN (ANCEF) INFUSION (OUTPATIENT ONLY)
0 refills | Status: AC
Start: 2024-05-12 — End: 2024-06-04

## 2024-05-12 MED ORDER — FLUCONAZOLE 150 MG PO TABS
150 | ORAL_TABLET | Freq: Once | ORAL | 0 refills | Status: DC
Start: 2024-05-12 — End: 2024-05-12

## 2024-05-12 NOTE — Telephone Encounter (Signed)
 Spoke with Cori pharmacist at [facility] and verified IV abx orders and weekly labs.  She verbalized understanding.    iv cefazolin  6g q24h ATC as a continuous infusion daily  - Planned End date: 06/04/2024  CBC w diff, CMP, ESR, CRP    Spoke with Manuelita at [facility] and verified IV abx order and weekly labs.  She verbalized understanding.

## 2024-05-12 NOTE — Care Coordination (Signed)
 Care Transitions Note    Initial Call - Call within 2 business days of discharge: Yes, spoke with patient or caregiver.    Patient Current Location:  Home: [address]  [place] [address]    Care Transition Nurse contacted the patient by telephone to perform post hospital discharge assessment, verified name and DOB as identifiers.  Provided introduction to self, and explanation of the Care Transition Nurse role.    Patient: Shelly Richard    Patient DOB: 1985-11-13   MRN: [id number]    Reason for Admission: sepsis  Discharge Date: 05/11/24  RURS: Readmission Risk Score: 18.8      Last Discharge Facility       Date Complaint Diagnosis Description Type Department Provider    05/03/24 Tachycardia Sepsis without acute organ dysfunction, due to unspecified organism (HCC) ... ED to Hosp-Admission (Discharged) (ADMITTED) [facility] [place], Karlene DEL, MD; [place], [place]...            Was this an external facility discharge? No    Additional needs identified to be addressed with provider   High priority: Patient discharged home from [facility] on 05/11/24 dx sepsis. Patient sent home with continuous IV antibiotic, Cefazolin  6h infusion via PICC line. Patient states she typically get yeast infections with antibiotic treatment thru 06/04/24.  requesting diflucan  prescription to be sent to [facility] on [address]               Method of communication with provider: chart routing.    Patients top risk factors for readmission: functional physical ability    Interventions to address risk factors:   Education: follow up     Care Summary Note: Patient answered call and verified DOB. Patient very pleasant and agreeable to transition call. Patient doing well and happy to be back home. Discussed recent hospitalization and discharge home yesterday with IV antibiotic prescription. Confirmed she received all supplies and antibiotics were currently in her refrigerator. Patient verbalized concern that home care nurse was contact,  but did not know patient was home. Stated nurse was suppose to visit tomorrow, which concerns her because she is not hooked up to the continuous antibiotic and does not know how to hook up herself.  Patient did confirm she got her IVP antibiotic while inpatient, but has not received any since then. Patient stated new PICC line was placed prior to her discharge home.  CTN placed follow up call to [name] and stated nurse [name] had patient on the schedule for today and unsure why the nurse would have told patient about missed discharge. [name] was to call nurse at completion of call and stated she would have it taken care of and hoe care nurse would visit patient today.  CTN called patient back and above information given. Patient confirmed she has AVS and taking medication as directed. Medication review completed and discussed request for diflucan  prescription with antibiotic prescription. Routed message to PCP- see yellow box.  Patient also asking about when staples would be removed from incision. Provided surgeon, Dr Deretha information and office number to contact and schedule HFU and discuss staple removal. Denied any redness, excessive swelling, or drainage. Denied any acute needs at present time.  Agreeable to f/u calls.  Educated on the use of urgent care or physicians 24 hr access line if assistance is needed after hours.       Plan of care updates since last contact:  Education: IV antibiotic       Advance Care  Planning:   Does patient have an Advance Directive: reviewed during previous call, see note. .    Medication Review:  Medication review was performed with patient,1111F entered: yes.     Assessments:  Care Transitions 24 Hour Call    Care Transitions Interventions          Follow Up Appointment:   Discussed follow up appointments. Patient has hospital follow up appointment scheduled within 7 days of discharge.   Future Appointments         Provider Specialty Dept Phone    06/03/2024 4:00 PM  Alexandro Bernice BRAVO [name] Family Medicine [phone]            Care Transition Nurse provided contact information.  Plan for follow-up on next business day.  based on severity of symptoms and risk factors.  Plan for next call: self management-      Madelin Jaegers, RN

## 2024-05-12 NOTE — Care Coordination (Signed)
 Care Transitions Note    Initial Call - Call within 2 business days of discharge: Yes, left message - first attempt.    Attempted to reach patient for transitions of care follow up. Unable to reach patient.    Outreach Attempts: call to [name] at [facility] and patient's ROC scheduled today  Spoke to patient and request call back this afternoon. CTN rescheduled call for later in the day  HIPAA compliant voicemail left for patient.     Patient: Shelly Richard    Patient DOB: 04/13/1986   MRN: [id number]    Reason for Admission: sepsis  Discharge Date: 05/11/24  RURS: Readmission Risk Score: 18.8    Last Discharge Facility       Date Complaint Diagnosis Description Type Department Provider    05/03/24 Tachycardia Sepsis without acute organ dysfunction, due to unspecified organism (HCC) ... ED to Hosp-Admission (Discharged) (ADMITTED) [facility] [place], Karlene DEL, MD; [place], [place]...            Was this an external facility discharge? No    Follow Up Appointment:   Patient does not have a follow up appointment scheduled at time of call. Rescheduled call from later today  Future Appointments         Provider Specialty Dept Phone    06/03/2024 4:00 PM Osterbrock, Erika E, [name] Family Medicine [phone]            Plan for follow-up on next business day.      Madelin Jaegers, RN

## 2024-05-12 NOTE — Telephone Encounter (Signed)
 Pt requesting additional medication for yeast infection as she will be on continuous abx until 06/04/24.

## 2024-05-12 NOTE — Addendum Note (Signed)
 Addended by: Jaxson Anglin E on: 05/12/2024 02:18 PM     Modules accepted: Orders

## 2024-05-12 NOTE — Telephone Encounter (Signed)
 Spoke with patient, made appt for 2 weeks post op

## 2024-05-12 NOTE — Telephone Encounter (Signed)
 Sent Diflucan  to pharmacy

## 2024-05-12 NOTE — Telephone Encounter (Signed)
-----   Message from Garland S sent at 05/12/2024  2:39 PM EST -----  Regarding: Specialty Message to Provider/robert erlichman      Relationship to Patient: Self     Patient Message: pt called need a post op appt.for 7 day after her sx,please call thanks   --------------------------------------------------------------------------------------------------------------------------    Call Back Information: OK to leave message on voicemail  Preferred Call Back Number:

## 2024-05-12 NOTE — Telephone Encounter (Signed)
 This is not a medication that she should take as needed.  Ideally, Diflucan  will treat the yeast infection that she has currently and if she needs more for subsequent yeast infections she can call the office.

## 2024-05-13 NOTE — Care Coordination (Signed)
 Care Transitions Note    Follow Up Call     Attempted to reach patient for transitions of care follow up.  Unable to reach patient.      Outreach Attempts:   HIPAA compliant voicemail left for patient.     Care Summary Note: Attempted to reach patient via phone for transition call.  VM left stating purpose of call along with my contact information requesting a return call.    Follow Up Appointment:   Future Appointments         Provider Specialty Dept Phone    05/18/2024 1:00 PM Alexandro Bernice BRAVO APRN - [facility] Family Medicine [phone]    05/24/2024 1:45 PM Deretha [place], MD Orthopedic Surgery [phone]    06/03/2024 4:00 PM Alexandro Bernice BRAVO, [name] Family Medicine [phone]            Plan for follow-up on next business day.  based on severity of symptoms and risk factors. Plan for next call: self management-msg to provider    Madelin Jaegers, RN

## 2024-05-14 NOTE — Telephone Encounter (Signed)
 Would get better target attainment with CI but if compliance will be an issue, can switch to 2gm q8hr.

## 2024-05-14 NOTE — Care Coordination (Signed)
 Care Transitions Note    Follow Up Call     Patient Current Location:  Home: [address]  [place] [address]    Care Transition Nurse contacted the patient by telephone. Verified name and DOB as identifiers.    Additional needs identified to be addressed with provider   No needs identified           Method of communication with provider: none.    Care Summary Note: Patient answered call and verified DOB. Patient pleasant and agreeable to transition call. Reviewed previous call and confirmed home care agency RN completed [facility] and scheduled follow up visits. IV antibiotics being infused without difficulty via PICC. Tolerating well. Continues to be tired, but doing well.  Taking all medications as directed and denied any acute needs at present time. Reviewed upcoming MD appts noted in system.  Agreeable to f/u calls.  Educated on the use of urgent care or physicians 24 hr access line if assistance is needed after hours.      Plan of care updates since last contact:  Education: follow up       Advance Care Planning:   Does patient have an Advance Directive: reviewed during previous call, see note. .    Medication Review:  Full medication review completed during previous call.    Assessments:  Care Transitions Subsequent and Final Call    Subsequent and Final Calls  Care Transitions Interventions  Other Interventions:              Follow Up Appointment:   Reviewed upcoming appointment(s).  Future Appointments         Provider Specialty Dept Phone    05/18/2024 1:00 PM Alexandro Bernice BRAVO APRN - [facility] Family Medicine [phone]    05/24/2024 1:45 PM Deretha [place], MD Orthopedic Surgery [phone]    06/03/2024 4:00 PM Alexandro Bernice BRAVO, [name] Family Medicine [phone]            Care Transition Nurse provided contact information.  Plan for follow-up call in 6-10 days based on severity of symptoms and risk factors.  Plan for next call: self management-      Madelin Jaegers, RN

## 2024-05-14 NOTE — Telephone Encounter (Signed)
 Spoke with Shelly Richard at [facility] and advised of pharmacist note, ok to switch to v cefazolin  2gm q8hr. He v/u    Spoke with Philippe RN at [facility] and advised of above. She v/u    Spoke with patient and advised of above.  She v/u

## 2024-05-14 NOTE — Telephone Encounter (Signed)
 Received call from patient asking if IV abx can be switched to Q8 hrs instead of ATC.  She reports she is getting it caught on everything and would prefer to switch it.

## 2024-05-16 ENCOUNTER — Encounter

## 2024-05-17 LAB — CBC WITH AUTO DIFFERENTIAL
Basophils %: 0.5 %
Basophils Absolute: 0 10*3/uL (ref 0.0–0.2)
Eosinophils %: 3.8 %
Eosinophils Absolute: 0.3 10*3/uL (ref 0.0–0.6)
Hematocrit: 32.2 % — ABNORMAL LOW (ref 36.0–48.0)
Hemoglobin: 10.4 g/dL — ABNORMAL LOW (ref 12.0–16.0)
Lymphocytes %: 30.5 %
Lymphocytes Absolute: 2.2 10*3/uL (ref 1.0–5.1)
MCH: 26.6 pg (ref 26.0–34.0)
MCHC: 32.4 g/dL (ref 31.0–36.0)
MCV: 82.1 fL (ref 80.0–100.0)
MPV: 8.7 fL (ref 5.0–10.5)
Monocytes %: 5.4 %
Monocytes Absolute: 0.4 10*3/uL (ref 0.0–1.3)
Neutrophils %: 59.8 %
Neutrophils Absolute: 4.3 10*3/uL (ref 1.7–7.7)
Platelets: 365 10*3/uL (ref 135–450)
RBC: 3.92 M/uL — ABNORMAL LOW (ref 4.00–5.20)
RDW: 16.2 % — ABNORMAL HIGH (ref 12.4–15.4)
WBC: 7.1 10*3/uL (ref 4.0–11.0)

## 2024-05-17 LAB — COMPREHENSIVE METABOLIC PANEL
ALT: 7 U/L — ABNORMAL LOW (ref 10–40)
AST: 12 U/L — ABNORMAL LOW (ref 15–37)
Albumin/Globulin Ratio: 0.9 — ABNORMAL LOW (ref 1.1–2.2)
Albumin: 3.5 g/dL (ref 3.4–5.0)
Alkaline Phosphatase: 146 U/L — ABNORMAL HIGH (ref 40–129)
Anion Gap: 9 (ref 3–16)
BUN: 8 mg/dL (ref 7–20)
CO2: 27 mmol/L (ref 21–32)
Calcium: 9.2 mg/dL (ref 8.3–10.6)
Chloride: 104 mmol/L (ref 99–110)
Creatinine: 0.6 mg/dL (ref 0.6–1.1)
Est, Glom Filt Rate: >90
Glucose: 250 mg/dL — ABNORMAL HIGH (ref 70–99)
Potassium: 4.6 mmol/L (ref 3.5–5.1)
Sodium: 140 mmol/L (ref 136–145)
Total Bilirubin: 0.4 mg/dL (ref 0.0–1.0)
Total Protein: 7.5 g/dL (ref 6.4–8.2)

## 2024-05-17 LAB — CULTURE, ANAEROBIC AND AEROBIC: WOUND/ABSCESS: NO GROWTH

## 2024-05-17 LAB — C-REACTIVE PROTEIN: CRP: 19.5 mg/L — ABNORMAL HIGH (ref 0.0–5.1)

## 2024-05-17 LAB — SEDIMENTATION RATE: Sed Rate, Automated: 64 mm/h — ABNORMAL HIGH (ref 0–20)

## 2024-05-20 ENCOUNTER — Ambulatory Visit: Admit: 2024-05-20 | Discharge: 2024-05-20 | Payer: BLUE CROSS/BLUE SHIELD | Attending: Oncology | Primary: Oncology

## 2024-05-20 NOTE — Assessment & Plan Note (Signed)
" {  A/P Summary:[id number]}         "

## 2024-05-20 NOTE — Assessment & Plan Note (Signed)
 Chronic, not at goal (unstable), BS running around 200 despite increase in insulin  during admission. Reports consistency with her regimen. Will increase Lantus  to 47 units, then increase to 50 units if not at goal. Increase prandial insulin  to 12 units plus sliding scale. Discussed referral to Endocrinology to take over management of her diabetes given recent DKA and being uncontrolled.  She is agreeable to this.  Referral placed

## 2024-05-20 NOTE — Assessment & Plan Note (Signed)
 Reviewed hospital admission records.  Following closely with infectious disease.  Currently on cefazolin  every 8 hours.  Doing well with administration.  Home care in place.  Undergoing blood work once a week and dressing changes of her PICC line.  Plan to continue IV antibiotics until 06/07/2024

## 2024-05-20 NOTE — Progress Notes (Signed)
 Post-Discharge Transitional Care  Follow Up      Shelly Richard   Date of Birth: April 28, 1985    Date of Office Visit: 05/20/2024  Date of Hospital Admission: 05/03/24  Date of Hospital Discharge: 05/11/24  Risk of hospital readmission (high >=14%. Medium >=10%): Readmission Risk Score: 18.8      Care management risk score Rising risk (score 2-5) and Complex Care (Scores >=6): No Risk Score On File     Non face to face following discharge, date last encounter closed (first attempt may have been earlier): 05/12/2024    Call initiated 2 business days of discharge: Yes, completed    ASSESSMENT/PLAN:   Assessment & Plan    Assessment & Plan  Hospital discharge follow-up   Home care in place.  Receiving IV antibiotics.  Will complete FMLA to extend until 06/08/2024.  Diabetic ketoacidosis without coma associated with type 2 diabetes mellitus (HCC)     Type 2 diabetes mellitus with hyperglycemia, with long-term current use of insulin  (HCC)   Chronic, not at goal (unstable), BS running around 200 despite increase in insulin  during admission. Reports consistency with her regimen. Will increase Lantus  to 47 units, then increase to 50 units if not at goal. Increase prandial insulin  to 12 units plus sliding scale. Discussed referral to Endocrinology to take over management of her diabetes given recent DKA and being uncontrolled.  She is agreeable to this.  Referral placed    MSSA bacteremia   Reviewed hospital admission records.  Following closely with infectious disease.  Currently on cefazolin  every 8 hours.  Doing well with administration.  Home care in place.  Undergoing blood work once a week and dressing changes of her PICC line.  Plan to continue IV antibiotics until 06/07/2024  Septic bursitis   Following with Ortho.  Underwent washout during admission.  Has follow-up with Ortho next week to remove staples.        Medical Decision Making: high complexity  No follow-ups on file.      SUBJECTIVE:   Inpatient course:  Discharge summary reviewed- see chart.    History of Present Illness  The patient is a 39 year old female who presents for a hospital follow-up after being admitted for sepsis and discharged with IV antibiotics.    She reports an improvement in her condition, with no current fevers. She was readmitted to the hospital due to persistent fevers and tachycardia, where she was started on a different IV antibiotic regimen. Cultures drawn during this admission remained positive. Her PICC line was replaced due to concerns of potential infection. She is scheduled to continue IV antibiotics until 06/07/2024, administered every 8 hours. She attempts to complete the doses during the day and has not missed any doses. Laboratory tests are being conducted via her PICC line, and the dressing is changed with each nursing visit.    Her blood glucose levels remain elevated, with a recent fasting blood sugar reading of 254. Her insulin  dosage has been increased to 45 units, with a sliding scale adjustment based on her blood sugar levels. She experienced one episode of hypoglycemia, with a blood sugar level dropping to 63, which occurred during sleep. She is consistent with her insulin  administration, ensuring no missed doses, particularly of Lantus . She has not previously used an insulin  pump. Her blood sugar levels fluctuate, occasionally reaching the 200s, but also dropping to as low as 99.           Patient Active Problem List  Diagnosis    Mixed hyperlipidemia    Iron  deficiency anemia    Type 2 diabetes mellitus with hyperglycemia, with long-term current use of insulin  (HCC)    Essential hypertension    Acquired hypothyroidism    Mild intermittent asthma without complication    Allergic rhinitis    Heart murmur    Uterine fibroid    Class 1 obesity due to excess calories with serious comorbidity and body mass index (BMI) of 31.0 to 31.9 in adult    Abnormal uterine bleeding    Eczema    Glaucoma suspect, bilateral    Hidradenitis  axillaris    HSV-2 infection    Myopic astigmatism, bilateral    Strabismus    S/P myomectomy    DKA, type 2, not at goal [facility])    Acute nonintractable headache    MSSA bacteremia    Diabetic ketoacidosis without coma associated with type 2 diabetes mellitus (HCC)    Greater trochanteric bursitis of left hip    Abscess of bursa of left hip    Sepsis (HCC)    Type 2 diabetes mellitus with other specified complication (HCC)       Medications listed as ordered at the time of discharge from hospital     Medication List            Accurate as of May 20, 2024  1:13 PM. If you have any questions, ask your nurse or doctor.                CONTINUE taking these medications      acetaminophen  500 MG tablet  Commonly known as: TYLENOL   Take 1 tablet by mouth 4 times daily as needed for Pain     [employer] Blood Glucose Meter w/Device Kit  Please provide meter approved by insurance.  Check bs qam     albuterol  sulfate HFA 108 (90 Base) MCG/ACT inhaler  Commonly known as: PROVENTIL ;VENTOLIN ;PROAIR   Inhale 2 puffs into the lungs every 6 hours as needed for Wheezing or Shortness of Breath     aspirin  81 MG EC tablet  Take 1 tablet by mouth in the morning and at bedtime     blood glucose test strips  Tes2t qam  times a day & as needed for symptoms of irregular blood glucose. Please provide test strips approved by insurance.     ceFAZolin  infusion  Commonly known as: ANCEF   Infuse 6,000 mg intravenously continuous for 23 days Compound per protocol     cetirizine  10 MG tablet  Commonly known as: ZYRTEC   Take 1 tablet by mouth daily     EPINEPHrine  0.3 MG/0.3ML Soaj injection  Commonly known as: EPIPEN      ferrous sulfate  325 (65 Fe) MG tablet  Commonly known as: FeroSul  Take 1 tablet by mouth daily (with breakfast)     FreeStyle Libre 3 Plus Sensor Misc  Apply every 15 days     ibuprofen  600 MG tablet  Commonly known as: ADVIL ;MOTRIN   Take 1 tablet by mouth every 6 hours as needed for Pain     insulin  lispro (1 Unit Dial ) 100 UNIT/ML  Sopn  Commonly known as: HumaLOG  KwikPen  Inject 5 Units into the skin 3 times daily (before meals) INJECT 5 UNITS UNDER THE SKIN three times with meals PLUS sliding scale -  2 UNITS FOR BLOOD SUGAR 150-200, 4 UNITS IF 201-250, 6 UNITS IF 251-300, 8 UNITS IF 301-350, 10 UNITS IF GREATER THAN 350.     *  [employer] Pen Needles 31G 31G X 8 MM Misc  Generic drug: Insulin  Pen Needle  1 each by Does not apply route daily     * Insulin  Pen Needle 32G X 6 MM Misc  Use four times daily and as needed     * Droplet Pen Needles 32G X 6 MM Misc  Generic drug: Insulin  Pen Needle  USE ONCE DAILY     Lancets Misc  1 each by Does not apply route daily Please provide lancets approved by insurance.     Lantus  SoloStar 100 UNIT/ML injection pen  Generic drug: insulin  glargine  Inject 45 Units into the skin nightly     levothyroxine  25 MCG tablet  Commonly known as: SYNTHROID   Take 1 tablet by mouth Daily     losartan  25 MG tablet  Commonly known as: COZAAR   TAKE ONE TABLET BY MOUTH DAILY     metFORMIN  500 MG extended release tablet  Commonly known as: GLUCOPHAGE -XR  Take 4 tablets by mouth daily (with breakfast)     montelukast  10 MG tablet  Commonly known as: SINGULAIR   Take 1 tablet by mouth daily     Ozempic  (1 MG/DOSE) 4 MG/3ML Sopn sc injection  Generic drug: Semaglutide  (1 MG/DOSE)  Inject 1 mg into the skin every 7 days     pantoprazole  20 MG tablet  Commonly known as: PROTONIX   Take 1 tablet by mouth every morning (before breakfast)     polyethylene glycol 17 g packet  Commonly known as: GLYCOLAX   Take 1 packet by mouth daily as needed for Constipation     simvastatin  40 MG tablet  Commonly known as: ZOCOR   Take 1 tablet by mouth daily     Vitamin D3 50 MCG (2000 UT) Tabs  TAKE ONE TABLET BY MOUTH DAILY           * This list has 3 medication(s) that are the same as other medications prescribed for you. Read the directions carefully, and ask your doctor or other care provider to review them with you.                    Medications  marked taking at this time  Outpatient Medications Marked as Taking for the 05/20/24 encounter (Office Visit) with Keiarra Charon E, [name]   Medication Sig Dispense Refill    ceFAZolin  (ANCEF ) infusion Infuse 6,000 mg intravenously continuous for 23 days Compound per protocol 23 each 0    insulin  lispro, 1 Unit Dial , (HUMALOG  KWIKPEN) 100 UNIT/ML SOPN Inject 5 Units into the skin 3 times daily (before meals) INJECT 5 UNITS UNDER THE SKIN three times with meals PLUS sliding scale -  2 UNITS FOR BLOOD SUGAR 150-200, 4 UNITS IF 201-250, 6 UNITS IF 251-300, 8 UNITS IF 301-350, 10 UNITS IF GREATER THAN 350. 4.5 mL 3    insulin  glargine (LANTUS  SOLOSTAR) 100 UNIT/ML injection pen Inject 45 Units into the skin nightly 13.5 mL 3    polyethylene glycol (GLYCOLAX ) 17 g packet Take 1 packet by mouth daily as needed for Constipation      aspirin  81 MG EC tablet Take 1 tablet by mouth in the morning and at bedtime 60 tablet 0    pantoprazole  (PROTONIX ) 20 MG tablet Take 1 tablet by mouth every morning (before breakfast) 30 tablet 0    levothyroxine  (SYNTHROID ) 25 MCG tablet Take 1 tablet by mouth Daily 30 tablet 3    simvastatin  (ZOCOR ) 40 MG tablet Take  1 tablet by mouth daily 90 tablet 1    losartan  (COZAAR ) 25 MG tablet TAKE ONE TABLET BY MOUTH DAILY 90 tablet 1    Semaglutide , 1 MG/DOSE, (OZEMPIC , 1 MG/DOSE,) 4 MG/3ML SOPN sc injection Inject 1 mg into the skin every 7 days 6 mL 1    metFORMIN  (GLUCOPHAGE -XR) 500 MG extended release tablet Take 4 tablets by mouth daily (with breakfast) 360 tablet 1    ferrous sulfate  (FEROSUL) 325 (65 Fe) MG tablet Take 1 tablet by mouth daily (with breakfast) 90 tablet 1    cetirizine  (ZYRTEC ) 10 MG tablet Take 1 tablet by mouth daily 90 tablet 1    montelukast  (SINGULAIR ) 10 MG tablet Take 1 tablet by mouth daily 90 tablet 1    Cholecalciferol (VITAMIN D3) 50 MCG (2000 UT) TABS TAKE ONE TABLET BY MOUTH DAILY 90 tablet 3    albuterol  sulfate HFA (PROVENTIL ;VENTOLIN ;PROAIR ) 108 (90  Base) MCG/ACT inhaler Inhale 2 puffs into the lungs every 6 hours as needed for Wheezing or Shortness of Breath 1 each 5    ibuprofen  (ADVIL ;MOTRIN ) 600 MG tablet Take 1 tablet by mouth every 6 hours as needed for Pain 120 tablet 0    acetaminophen  (TYLENOL ) 500 MG tablet Take 1 tablet by mouth 4 times daily as needed for Pain 120 tablet 0    EPINEPHrine  (EPIPEN ) 0.3 MG/0.3ML SOAJ injection       blood glucose monitor strips Tes2t qam  times a day & as needed for symptoms of irregular blood glucose. Please provide test strips approved by insurance. 100 strip 2    Blood Glucose Monitoring Suppl ([employer] BLOOD GLUCOSE METER) w/Device KIT Please provide meter approved by insurance.  Check bs qam 1 kit 0        Medications patient taking as of now reconciled against medications ordered at time of hospital discharge: Yes    A comprehensive review of systems was negative except for what was noted in the HPI.    OBJECTIVE:       Vitals:    05/20/24 1305   BP: 112/72   BP Site: Right Upper Arm   Patient Position: Sitting   BP Cuff Size: Medium Adult   Pulse: 93   Temp: 98.5 F (36.9 C)   SpO2: 99%   Weight: 75.8 kg (167 lb)   Height: 1.626 m (5' 4)      Body mass index is 28.67 kg/m.       Physical Exam  Constitutional:       Appearance: Normal appearance.   HENT:      Head: Normocephalic and atraumatic.   Eyes:      Extraocular Movements: Extraocular movements intact.   Pulmonary:      Effort: Pulmonary effort is normal.   Musculoskeletal:      Cervical back: Normal range of motion.   Skin:     Coloration: Skin is not jaundiced or pale.   Neurological:      General: No focal deficit present.      Mental Status: She is alert and oriented to person, place, and time.   Psychiatric:         Mood and Affect: Mood normal.         Behavior: Behavior normal.         Thought Content: Thought content normal.            An electronic signature was used to authenticate this note.  --Bernice FORBES Shields, [name]

## 2024-05-20 NOTE — Assessment & Plan Note (Signed)
 Following with Ortho.  Underwent washout during admission.  Has follow-up with Ortho next week to remove staples.

## 2024-05-20 NOTE — Care Coordination (Signed)
 Care Transitions Note    Follow Up Call     Patient Current Location:  Home: [address]  [place] [address]    Care Transition Nurse contacted the patient by telephone. Verified name and DOB as identifiers.    Additional needs identified to be addressed with provider   No needs identified           Method of communication with provider: none.    Care Summary Note: Patient answered call and verified DOB. Patient pleasant and agreeable to transition call. Patient doing good since last contact and was seen by PCP today.  Discussed visit and continues with IV antibiotics. Continues to be active with home care and blood work drawn by home care nurse.   FMLA extended thru 06/08/24 thru PCP paperwork.   Taking all medication as directed. Follow up visits discussed and noted in system. Denied any acute needs at present time.  Agreeable to f/u calls.  Educated on the use of urgent care or physicians 24 hr access line if assistance is needed after hours.      Plan of care updates since last contact:  Education: follow up visit       Advance Care Planning:   Does patient have an Advance Directive: reviewed during previous call, see note. .    Medication Review:  Full medication review completed during previous call.    Assessments:  Care Transitions Subsequent and Final Call    Subsequent and Final Calls  Care Transitions Interventions  Other Interventions:              Follow Up Appointment:   Reviewed upcoming appointment(s). and [facility] appointment attended as scheduled   Future Appointments         Provider Specialty Dept Phone    05/24/2024 1:45 PM Deretha Charleston, MD Orthopedic Surgery [phone]    07/07/2024 4:15 PM Alexandro Bernice BRAVO, [name] Family Medicine [phone]            Care Transition Nurse provided contact information.  Plan for follow-up call in 6-10 days based on severity of symptoms and risk factors.  Plan for next call: self management-    Madelin Jaegers, RN   [phone]

## 2024-05-24 ENCOUNTER — Encounter: Payer: BLUE CROSS/BLUE SHIELD | Attending: Orthopaedic Surgery | Primary: Oncology

## 2024-06-03 ENCOUNTER — Encounter: Payer: BLUE CROSS/BLUE SHIELD | Attending: Oncology | Primary: Oncology
